# Patient Record
Sex: Female | Born: 1940
Health system: Southern US, Community
[De-identification: ages and names within clinical notes are randomized; demographics above are authoritative.]

## PROBLEM LIST (undated history)

## (undated) DIAGNOSIS — E042 Nontoxic multinodular goiter: Secondary | ICD-10-CM

## (undated) DIAGNOSIS — I452 Bifascicular block: Secondary | ICD-10-CM

## (undated) DIAGNOSIS — I779 Disorder of arteries and arterioles, unspecified: Secondary | ICD-10-CM

## (undated) DIAGNOSIS — I639 Cerebral infarction, unspecified: Secondary | ICD-10-CM

## (undated) DIAGNOSIS — I2510 Atherosclerotic heart disease of native coronary artery without angina pectoris: Secondary | ICD-10-CM

## (undated) DIAGNOSIS — I471 Supraventricular tachycardia, unspecified: Secondary | ICD-10-CM

## (undated) DIAGNOSIS — Z789 Other specified health status: Secondary | ICD-10-CM

## (undated) DIAGNOSIS — M51369 Other intervertebral disc degeneration, lumbar region without mention of lumbar back pain or lower extremity pain: Secondary | ICD-10-CM

## (undated) DIAGNOSIS — S060XAA Concussion with loss of consciousness status unknown, initial encounter: Secondary | ICD-10-CM

## (undated) DIAGNOSIS — E785 Hyperlipidemia, unspecified: Secondary | ICD-10-CM

## (undated) DIAGNOSIS — M5030 Other cervical disc degeneration, unspecified cervical region: Secondary | ICD-10-CM

## (undated) DIAGNOSIS — R55 Syncope and collapse: Secondary | ICD-10-CM

## (undated) DIAGNOSIS — I10 Essential (primary) hypertension: Secondary | ICD-10-CM

## (undated) DIAGNOSIS — G8929 Other chronic pain: Secondary | ICD-10-CM

## (undated) DIAGNOSIS — I455 Other specified heart block: Secondary | ICD-10-CM

## (undated) DIAGNOSIS — I739 Peripheral vascular disease, unspecified: Secondary | ICD-10-CM

## (undated) DIAGNOSIS — J189 Pneumonia, unspecified organism: Secondary | ICD-10-CM

## (undated) DIAGNOSIS — M25561 Pain in right knee: Secondary | ICD-10-CM

## (undated) DIAGNOSIS — K219 Gastro-esophageal reflux disease without esophagitis: Secondary | ICD-10-CM

## (undated) DIAGNOSIS — S8290XA Unspecified fracture of unspecified lower leg, initial encounter for closed fracture: Secondary | ICD-10-CM

## (undated) DIAGNOSIS — E119 Type 2 diabetes mellitus without complications: Secondary | ICD-10-CM

## (undated) DIAGNOSIS — N281 Cyst of kidney, acquired: Secondary | ICD-10-CM

## (undated) DIAGNOSIS — S060X9A Concussion with loss of consciousness of unspecified duration, initial encounter: Secondary | ICD-10-CM

## (undated) DIAGNOSIS — D649 Anemia, unspecified: Secondary | ICD-10-CM

## (undated) DIAGNOSIS — M549 Dorsalgia, unspecified: Secondary | ICD-10-CM

## (undated) DIAGNOSIS — M5136 Other intervertebral disc degeneration, lumbar region: Secondary | ICD-10-CM

## (undated) HISTORY — DX: Supraventricular tachycardia, unspecified: I47.10

## (undated) HISTORY — DX: Anemia, unspecified: D64.9

## (undated) HISTORY — DX: Other specified health status: Z78.9

## (undated) HISTORY — DX: Concussion with loss of consciousness status unknown, initial encounter: S06.0XAA

## (undated) HISTORY — DX: Gastro-esophageal reflux disease without esophagitis: K21.9

## (undated) HISTORY — DX: Other cervical disc degeneration, unspecified cervical region: M50.30

## (undated) HISTORY — DX: Hyperlipidemia, unspecified: E78.5

## (undated) HISTORY — DX: Cerebral infarction, unspecified: I63.9

## (undated) HISTORY — DX: Other specified heart block: I45.5

## (undated) HISTORY — PX: BREAST BIOPSY: SHX20

## (undated) HISTORY — DX: Other intervertebral disc degeneration, lumbar region: M51.36

## (undated) HISTORY — DX: Pain in right knee: M25.561

## (undated) HISTORY — DX: Supraventricular tachycardia: I47.1

## (undated) HISTORY — DX: Other intervertebral disc degeneration, lumbar region without mention of lumbar back pain or lower extremity pain: M51.369

## (undated) HISTORY — DX: Concussion with loss of consciousness of unspecified duration, initial encounter: S06.0X9A

## (undated) HISTORY — PX: BACK SURGERY: SHX140

## (undated) HISTORY — DX: Essential (primary) hypertension: I10

## (undated) HISTORY — PX: CORONARY ANGIOPLASTY: SHX604

## (undated) HISTORY — DX: Cyst of kidney, acquired: N28.1

## (undated) HISTORY — DX: Bifascicular block: I45.2

## (undated) HISTORY — DX: Unspecified fracture of unspecified lower leg, initial encounter for closed fracture: S82.90XA

---

## 1966-05-15 HISTORY — PX: VAGINAL HYSTERECTOMY: SUR661

## 1966-05-15 HISTORY — PX: BREAST LUMPECTOMY: SHX2

## 1985-05-15 DIAGNOSIS — D649 Anemia, unspecified: Secondary | ICD-10-CM

## 1985-05-15 HISTORY — DX: Anemia, unspecified: D64.9

## 1992-08-13 HISTORY — PX: LAPAROSCOPIC CHOLECYSTECTOMY: SUR755

## 1998-12-17 ENCOUNTER — Other Ambulatory Visit: Admission: RE | Admit: 1998-12-17 | Discharge: 1998-12-17 | Payer: Self-pay | Admitting: Obstetrics and Gynecology

## 1999-02-14 ENCOUNTER — Encounter (INDEPENDENT_AMBULATORY_CARE_PROVIDER_SITE_OTHER): Payer: Self-pay

## 1999-02-14 ENCOUNTER — Ambulatory Visit (HOSPITAL_COMMUNITY): Admission: RE | Admit: 1999-02-14 | Discharge: 1999-02-14 | Payer: Self-pay | Admitting: Gastroenterology

## 1999-06-14 ENCOUNTER — Encounter: Admission: RE | Admit: 1999-06-14 | Discharge: 1999-06-14 | Payer: Self-pay | Admitting: Family Medicine

## 1999-06-14 ENCOUNTER — Encounter: Payer: Self-pay | Admitting: Family Medicine

## 1999-06-21 ENCOUNTER — Encounter: Payer: Self-pay | Admitting: Family Medicine

## 1999-06-21 ENCOUNTER — Encounter: Admission: RE | Admit: 1999-06-21 | Discharge: 1999-06-21 | Payer: Self-pay | Admitting: Family Medicine

## 1999-06-29 ENCOUNTER — Encounter: Payer: Self-pay | Admitting: Family Medicine

## 1999-06-29 ENCOUNTER — Encounter: Admission: RE | Admit: 1999-06-29 | Discharge: 1999-06-29 | Payer: Self-pay | Admitting: Family Medicine

## 2000-03-15 ENCOUNTER — Ambulatory Visit (HOSPITAL_COMMUNITY): Admission: RE | Admit: 2000-03-15 | Discharge: 2000-03-15 | Payer: Self-pay | Admitting: *Deleted

## 2000-03-15 ENCOUNTER — Encounter: Payer: Self-pay | Admitting: *Deleted

## 2000-05-15 DIAGNOSIS — E119 Type 2 diabetes mellitus without complications: Secondary | ICD-10-CM

## 2000-05-15 HISTORY — DX: Type 2 diabetes mellitus without complications: E11.9

## 2000-07-06 ENCOUNTER — Encounter: Payer: Self-pay | Admitting: Family Medicine

## 2000-07-06 LAB — CONVERTED CEMR LAB: Hgb A1c MFr Bld: 8.3 %

## 2000-07-17 ENCOUNTER — Ambulatory Visit (HOSPITAL_COMMUNITY): Admission: RE | Admit: 2000-07-17 | Discharge: 2000-07-17 | Payer: Self-pay | Admitting: *Deleted

## 2000-07-17 ENCOUNTER — Encounter: Payer: Self-pay | Admitting: *Deleted

## 2000-11-24 ENCOUNTER — Encounter: Payer: Self-pay | Admitting: Family Medicine

## 2000-11-24 LAB — CONVERTED CEMR LAB: Hgb A1c MFr Bld: 9.6 %

## 2001-07-13 DIAGNOSIS — S8290XA Unspecified fracture of unspecified lower leg, initial encounter for closed fracture: Secondary | ICD-10-CM

## 2001-07-13 DIAGNOSIS — R55 Syncope and collapse: Secondary | ICD-10-CM

## 2001-07-13 HISTORY — DX: Unspecified fracture of unspecified lower leg, initial encounter for closed fracture: S82.90XA

## 2001-07-13 HISTORY — DX: Syncope and collapse: R55

## 2001-09-03 ENCOUNTER — Other Ambulatory Visit: Admission: RE | Admit: 2001-09-03 | Discharge: 2001-09-03 | Payer: Self-pay | Admitting: Obstetrics and Gynecology

## 2001-09-12 HISTORY — PX: POSTERIOR LAMINECTOMY / DECOMPRESSION LUMBAR SPINE: SUR740

## 2001-11-19 ENCOUNTER — Ambulatory Visit (HOSPITAL_COMMUNITY): Admission: RE | Admit: 2001-11-19 | Discharge: 2001-11-19 | Payer: Self-pay | Admitting: Family Medicine

## 2001-11-19 ENCOUNTER — Encounter: Payer: Self-pay | Admitting: Family Medicine

## 2002-05-30 ENCOUNTER — Emergency Department (HOSPITAL_COMMUNITY): Admission: EM | Admit: 2002-05-30 | Discharge: 2002-05-30 | Payer: Self-pay | Admitting: Emergency Medicine

## 2002-05-30 ENCOUNTER — Encounter: Payer: Self-pay | Admitting: Emergency Medicine

## 2002-06-27 ENCOUNTER — Encounter: Payer: Self-pay | Admitting: Family Medicine

## 2002-06-27 ENCOUNTER — Ambulatory Visit (HOSPITAL_COMMUNITY): Admission: RE | Admit: 2002-06-27 | Discharge: 2002-06-27 | Payer: Self-pay | Admitting: Family Medicine

## 2002-07-15 ENCOUNTER — Encounter: Payer: Self-pay | Admitting: Neurological Surgery

## 2002-07-15 ENCOUNTER — Ambulatory Visit (HOSPITAL_COMMUNITY): Admission: RE | Admit: 2002-07-15 | Discharge: 2002-07-16 | Payer: Self-pay | Admitting: Neurological Surgery

## 2002-12-14 ENCOUNTER — Encounter: Payer: Self-pay | Admitting: Family Medicine

## 2002-12-14 LAB — CONVERTED CEMR LAB

## 2003-01-13 ENCOUNTER — Other Ambulatory Visit: Admission: RE | Admit: 2003-01-13 | Discharge: 2003-01-13 | Payer: Self-pay | Admitting: Obstetrics and Gynecology

## 2003-04-03 ENCOUNTER — Encounter: Payer: Self-pay | Admitting: Family Medicine

## 2003-04-03 LAB — CONVERTED CEMR LAB: Microalbumin U total vol: 1.6 mg/L

## 2003-11-18 ENCOUNTER — Encounter: Payer: Self-pay | Admitting: Family Medicine

## 2003-11-18 LAB — CONVERTED CEMR LAB: Hgb A1c MFr Bld: 5.2 %

## 2004-04-15 ENCOUNTER — Other Ambulatory Visit: Admission: RE | Admit: 2004-04-15 | Discharge: 2004-04-15 | Payer: Self-pay | Admitting: Obstetrics and Gynecology

## 2004-06-08 ENCOUNTER — Ambulatory Visit: Payer: Self-pay | Admitting: Family Medicine

## 2004-06-10 ENCOUNTER — Ambulatory Visit: Payer: Self-pay | Admitting: Family Medicine

## 2005-03-10 ENCOUNTER — Ambulatory Visit: Payer: Self-pay | Admitting: Internal Medicine

## 2005-06-15 ENCOUNTER — Encounter: Payer: Self-pay | Admitting: Family Medicine

## 2005-08-14 HISTORY — PX: ESOPHAGOGASTRODUODENOSCOPY: SHX1529

## 2005-08-21 ENCOUNTER — Ambulatory Visit: Payer: Self-pay | Admitting: Family Medicine

## 2005-08-24 ENCOUNTER — Encounter: Admission: RE | Admit: 2005-08-24 | Discharge: 2005-08-24 | Payer: Self-pay | Admitting: Family Medicine

## 2005-08-31 ENCOUNTER — Encounter (INDEPENDENT_AMBULATORY_CARE_PROVIDER_SITE_OTHER): Payer: Self-pay | Admitting: Specialist

## 2005-08-31 ENCOUNTER — Ambulatory Visit: Payer: Self-pay | Admitting: Endocrinology

## 2005-08-31 ENCOUNTER — Other Ambulatory Visit: Admission: RE | Admit: 2005-08-31 | Discharge: 2005-08-31 | Payer: Self-pay | Admitting: Endocrinology

## 2005-10-03 ENCOUNTER — Inpatient Hospital Stay (HOSPITAL_COMMUNITY): Admission: RE | Admit: 2005-10-03 | Discharge: 2005-10-05 | Payer: Self-pay | Admitting: Neurological Surgery

## 2005-10-03 HISTORY — PX: ANTERIOR CERVICAL DECOMP/DISCECTOMY FUSION: SHX1161

## 2005-11-24 ENCOUNTER — Encounter: Admission: RE | Admit: 2005-11-24 | Discharge: 2005-11-24 | Payer: Self-pay | Admitting: Neurological Surgery

## 2006-02-08 ENCOUNTER — Ambulatory Visit: Payer: Self-pay | Admitting: Endocrinology

## 2006-02-14 ENCOUNTER — Ambulatory Visit: Payer: Self-pay | Admitting: Family Medicine

## 2006-02-16 ENCOUNTER — Ambulatory Visit (HOSPITAL_COMMUNITY): Admission: RE | Admit: 2006-02-16 | Discharge: 2006-02-16 | Payer: Self-pay | Admitting: Endocrinology

## 2006-03-09 ENCOUNTER — Ambulatory Visit: Payer: Self-pay | Admitting: Family Medicine

## 2006-07-10 ENCOUNTER — Emergency Department (HOSPITAL_COMMUNITY): Admission: EM | Admit: 2006-07-10 | Discharge: 2006-07-10 | Payer: Self-pay | Admitting: Emergency Medicine

## 2006-07-10 ENCOUNTER — Encounter: Admission: RE | Admit: 2006-07-10 | Discharge: 2006-07-10 | Payer: Self-pay | Admitting: Gastroenterology

## 2006-07-10 ENCOUNTER — Ambulatory Visit: Payer: Self-pay | Admitting: Cardiology

## 2006-07-10 DIAGNOSIS — N281 Cyst of kidney, acquired: Secondary | ICD-10-CM

## 2006-07-10 HISTORY — DX: Cyst of kidney, acquired: N28.1

## 2006-07-19 ENCOUNTER — Observation Stay (HOSPITAL_COMMUNITY): Admission: EM | Admit: 2006-07-19 | Discharge: 2006-07-20 | Payer: Self-pay | Admitting: Emergency Medicine

## 2006-07-19 ENCOUNTER — Ambulatory Visit: Payer: Self-pay | Admitting: Internal Medicine

## 2006-07-23 ENCOUNTER — Encounter: Payer: Self-pay | Admitting: Internal Medicine

## 2006-07-23 ENCOUNTER — Ambulatory Visit: Payer: Self-pay

## 2006-07-24 ENCOUNTER — Ambulatory Visit: Payer: Self-pay | Admitting: Cardiology

## 2006-08-21 ENCOUNTER — Encounter: Payer: Self-pay | Admitting: Family Medicine

## 2006-08-21 DIAGNOSIS — E785 Hyperlipidemia, unspecified: Secondary | ICD-10-CM | POA: Insufficient documentation

## 2006-08-21 DIAGNOSIS — E119 Type 2 diabetes mellitus without complications: Secondary | ICD-10-CM

## 2006-08-21 DIAGNOSIS — M5030 Other cervical disc degeneration, unspecified cervical region: Secondary | ICD-10-CM

## 2006-08-21 DIAGNOSIS — F329 Major depressive disorder, single episode, unspecified: Secondary | ICD-10-CM

## 2006-08-21 DIAGNOSIS — I10 Essential (primary) hypertension: Secondary | ICD-10-CM

## 2006-08-21 DIAGNOSIS — M5137 Other intervertebral disc degeneration, lumbosacral region: Secondary | ICD-10-CM

## 2006-08-21 DIAGNOSIS — D649 Anemia, unspecified: Secondary | ICD-10-CM

## 2006-09-06 ENCOUNTER — Ambulatory Visit: Payer: Self-pay | Admitting: Family Medicine

## 2006-09-10 ENCOUNTER — Ambulatory Visit: Payer: Self-pay | Admitting: Family Medicine

## 2006-09-18 ENCOUNTER — Encounter: Payer: Self-pay | Admitting: Family Medicine

## 2006-10-01 ENCOUNTER — Ambulatory Visit: Payer: Self-pay | Admitting: Cardiovascular Disease

## 2006-10-31 ENCOUNTER — Encounter (INDEPENDENT_AMBULATORY_CARE_PROVIDER_SITE_OTHER): Payer: Self-pay | Admitting: *Deleted

## 2007-01-23 ENCOUNTER — Encounter: Payer: Self-pay | Admitting: Family Medicine

## 2007-02-13 ENCOUNTER — Ambulatory Visit: Payer: Self-pay | Admitting: Family Medicine

## 2007-03-08 ENCOUNTER — Ambulatory Visit: Payer: Self-pay | Admitting: Family Medicine

## 2007-03-10 LAB — CONVERTED CEMR LAB
ALT: 24 units/L (ref 0–35)
AST: 20 units/L (ref 0–37)
Calcium: 9.3 mg/dL (ref 8.4–10.5)
Chloride: 102 meq/L (ref 96–112)
Cholesterol: 138 mg/dL (ref 0–200)
Glucose, Bld: 316 mg/dL — ABNORMAL HIGH (ref 70–99)
HDL: 34.2 mg/dL — ABNORMAL LOW (ref >39.0)
LDL Cholesterol: 83 mg/dL (ref 0–99)
Potassium: 4.3 meq/L (ref 3.5–5.1)
Sodium: 138 meq/L (ref 135–145)
TSH: 2.55 microintl units/mL (ref 0.35–5.50)
Total Bilirubin: 0.8 mg/dL (ref 0.3–1.2)
Total CHOL/HDL Ratio: 4
Total Protein: 6.7 g/dL (ref 6.0–8.3)
Triglycerides: 105 mg/dL (ref 0–149)

## 2007-03-12 ENCOUNTER — Ambulatory Visit: Payer: Self-pay | Admitting: Family Medicine

## 2007-03-25 ENCOUNTER — Ambulatory Visit: Payer: Self-pay | Admitting: Cardiovascular Disease

## 2007-03-27 ENCOUNTER — Telehealth (INDEPENDENT_AMBULATORY_CARE_PROVIDER_SITE_OTHER): Payer: Self-pay | Admitting: *Deleted

## 2007-04-01 ENCOUNTER — Ambulatory Visit: Payer: Self-pay | Admitting: Family Medicine

## 2007-05-07 ENCOUNTER — Ambulatory Visit: Payer: Self-pay | Admitting: Family Medicine

## 2007-05-07 LAB — CONVERTED CEMR LAB: Hgb A1c MFr Bld: 8.3 % — ABNORMAL HIGH (ref 4.6–6.0)

## 2007-05-13 ENCOUNTER — Ambulatory Visit: Payer: Self-pay | Admitting: Family Medicine

## 2007-05-29 ENCOUNTER — Encounter: Payer: Self-pay | Admitting: Family Medicine

## 2007-06-12 ENCOUNTER — Encounter: Admission: RE | Admit: 2007-06-12 | Discharge: 2007-06-12 | Payer: Self-pay | Admitting: Gastroenterology

## 2007-06-12 ENCOUNTER — Encounter: Payer: Self-pay | Admitting: Family Medicine

## 2007-06-27 ENCOUNTER — Telehealth: Payer: Self-pay | Admitting: Family Medicine

## 2007-07-31 ENCOUNTER — Ambulatory Visit: Payer: Self-pay | Admitting: Family Medicine

## 2007-08-07 ENCOUNTER — Ambulatory Visit: Payer: Self-pay | Admitting: Family Medicine

## 2007-08-07 DIAGNOSIS — K210 Gastro-esophageal reflux disease with esophagitis: Secondary | ICD-10-CM

## 2007-08-07 DIAGNOSIS — E042 Nontoxic multinodular goiter: Secondary | ICD-10-CM

## 2007-08-21 ENCOUNTER — Encounter: Admission: RE | Admit: 2007-08-21 | Discharge: 2007-08-21 | Payer: Self-pay | Admitting: Family Medicine

## 2007-09-10 ENCOUNTER — Ambulatory Visit: Payer: Self-pay | Admitting: Family Medicine

## 2007-09-10 LAB — CONVERTED CEMR LAB: Rapid Strep: NEGATIVE

## 2007-12-18 ENCOUNTER — Telehealth (INDEPENDENT_AMBULATORY_CARE_PROVIDER_SITE_OTHER): Payer: Self-pay | Admitting: Internal Medicine

## 2008-01-06 ENCOUNTER — Ambulatory Visit: Payer: Self-pay | Admitting: Family Medicine

## 2008-01-06 LAB — CONVERTED CEMR LAB
AST: 22 units/L (ref 0–37)
Albumin: 4 g/dL (ref 3.5–5.2)
Basophils Relative: 0.2 % (ref 0.0–3.0)
CO2: 29 meq/L (ref 19–32)
Chloride: 110 meq/L (ref 96–112)
Creatinine, Ser: 1.1 mg/dL (ref 0.4–1.2)
Free T4: 0.9 ng/dL (ref 0.6–1.6)
GFR calc Af Amer: 64 mL/min
GFR calc non Af Amer: 52 mL/min
Glucose, Bld: 189 mg/dL — ABNORMAL HIGH (ref 70–99)
HCT: 40.6 % (ref 36.0–46.0)
Lymphocytes Relative: 27.1 % (ref 12.0–46.0)
MCHC: 34.7 g/dL (ref 30.0–36.0)
Monocytes Absolute: 0.5 10*3/uL (ref 0.1–1.0)
Potassium: 4.7 meq/L (ref 3.5–5.1)
T3, Free: 3.1 pg/mL (ref 2.3–4.2)
TSH: 2.38 microintl units/mL (ref 0.35–5.50)
Total Bilirubin: 0.8 mg/dL (ref 0.3–1.2)
Total CHOL/HDL Ratio: 4.9
Total Protein: 6.9 g/dL (ref 6.0–8.3)
WBC: 8 10*3/uL (ref 4.5–10.5)

## 2008-01-08 ENCOUNTER — Ambulatory Visit: Payer: Self-pay | Admitting: Cardiovascular Disease

## 2008-01-15 ENCOUNTER — Telehealth: Payer: Self-pay | Admitting: Family Medicine

## 2008-01-15 ENCOUNTER — Ambulatory Visit: Payer: Self-pay | Admitting: Family Medicine

## 2008-01-16 ENCOUNTER — Encounter: Payer: Self-pay | Admitting: Family Medicine

## 2008-02-03 ENCOUNTER — Encounter: Payer: Self-pay | Admitting: Family Medicine

## 2008-02-07 ENCOUNTER — Encounter: Payer: Self-pay | Admitting: Family Medicine

## 2008-04-01 ENCOUNTER — Ambulatory Visit: Payer: Self-pay | Admitting: Cardiovascular Disease

## 2008-04-22 ENCOUNTER — Ambulatory Visit: Payer: Self-pay | Admitting: Family Medicine

## 2008-04-29 ENCOUNTER — Ambulatory Visit: Payer: Self-pay | Admitting: Family Medicine

## 2008-08-21 ENCOUNTER — Encounter: Payer: Self-pay | Admitting: Family Medicine

## 2008-11-12 DIAGNOSIS — N281 Cyst of kidney, acquired: Secondary | ICD-10-CM

## 2008-11-12 DIAGNOSIS — M549 Dorsalgia, unspecified: Secondary | ICD-10-CM

## 2008-11-12 DIAGNOSIS — I471 Supraventricular tachycardia: Secondary | ICD-10-CM

## 2008-11-12 DIAGNOSIS — K219 Gastro-esophageal reflux disease without esophagitis: Secondary | ICD-10-CM | POA: Insufficient documentation

## 2008-11-12 DIAGNOSIS — R002 Palpitations: Secondary | ICD-10-CM

## 2008-11-13 ENCOUNTER — Ambulatory Visit: Payer: Self-pay | Admitting: Cardiovascular Disease

## 2008-11-13 DIAGNOSIS — R079 Chest pain, unspecified: Secondary | ICD-10-CM

## 2008-12-07 LAB — CONVERTED CEMR LAB: Pap Smear: NORMAL

## 2009-01-01 ENCOUNTER — Telehealth: Payer: Self-pay | Admitting: Cardiovascular Disease

## 2009-01-04 ENCOUNTER — Encounter (INDEPENDENT_AMBULATORY_CARE_PROVIDER_SITE_OTHER): Payer: Self-pay | Admitting: *Deleted

## 2009-02-16 ENCOUNTER — Ambulatory Visit: Payer: Self-pay | Admitting: Family Medicine

## 2009-02-16 LAB — CONVERTED CEMR LAB
ALT: 19 units/L (ref 0–35)
AST: 17 units/L (ref 0–37)
Basophils Absolute: 0.1 10*3/uL (ref 0.0–0.1)
Bilirubin, Direct: 0 mg/dL (ref 0.0–0.3)
CO2: 28 meq/L (ref 19–32)
Chloride: 108 meq/L (ref 96–112)
Cholesterol: 130 mg/dL (ref 0–200)
Creatinine,U: 173 mg/dL
Eosinophils Relative: 4.1 % (ref 0.0–5.0)
Free T4: 0.8 ng/dL (ref 0.6–1.6)
HDL: 35 mg/dL — ABNORMAL LOW (ref >39.00)
Hgb A1c MFr Bld: 7 % — ABNORMAL HIGH (ref 4.6–6.5)
MCV: 89.7 fL (ref 78.0–100.0)
Microalb Creat Ratio: 2.9 mg/g (ref 0.0–30.0)
Microalb, Ur: 0.5 mg/dL (ref 0.0–1.9)
Neutro Abs: 4.5 10*3/uL (ref 1.4–7.7)
Neutrophils Relative %: 60.9 % (ref 43.0–77.0)
Platelets: 362 10*3/uL (ref 150.0–400.0)
Potassium: 4.5 meq/L (ref 3.5–5.1)
RBC: 4.43 M/uL (ref 3.87–5.11)
RDW: 12.7 % (ref 11.5–14.6)
Sodium: 141 meq/L (ref 135–145)

## 2009-02-17 ENCOUNTER — Ambulatory Visit: Payer: Self-pay | Admitting: Family Medicine

## 2009-02-22 ENCOUNTER — Ambulatory Visit: Payer: Self-pay | Admitting: Family Medicine

## 2009-02-22 DIAGNOSIS — M62838 Other muscle spasm: Secondary | ICD-10-CM | POA: Insufficient documentation

## 2009-02-22 DIAGNOSIS — R55 Syncope and collapse: Secondary | ICD-10-CM

## 2009-03-15 ENCOUNTER — Telehealth: Payer: Self-pay | Admitting: Family Medicine

## 2009-03-26 ENCOUNTER — Encounter (INDEPENDENT_AMBULATORY_CARE_PROVIDER_SITE_OTHER): Payer: Self-pay | Admitting: *Deleted

## 2009-05-17 ENCOUNTER — Encounter: Payer: Self-pay | Admitting: Family Medicine

## 2009-06-09 ENCOUNTER — Ambulatory Visit: Payer: Self-pay | Admitting: Cardiovascular Disease

## 2009-06-11 ENCOUNTER — Ambulatory Visit: Payer: Self-pay

## 2009-07-16 ENCOUNTER — Telehealth (INDEPENDENT_AMBULATORY_CARE_PROVIDER_SITE_OTHER): Payer: Self-pay | Admitting: *Deleted

## 2009-09-07 ENCOUNTER — Ambulatory Visit: Payer: Self-pay | Admitting: Cardiovascular Disease

## 2009-10-01 ENCOUNTER — Ambulatory Visit: Payer: Self-pay | Admitting: Internal Medicine

## 2009-10-01 DIAGNOSIS — M25569 Pain in unspecified knee: Secondary | ICD-10-CM | POA: Insufficient documentation

## 2009-10-04 ENCOUNTER — Ambulatory Visit: Payer: Self-pay | Admitting: Family Medicine

## 2009-10-07 ENCOUNTER — Ambulatory Visit: Payer: Self-pay | Admitting: Family Medicine

## 2009-10-08 ENCOUNTER — Encounter: Payer: Self-pay | Admitting: Family Medicine

## 2009-12-16 ENCOUNTER — Encounter (INDEPENDENT_AMBULATORY_CARE_PROVIDER_SITE_OTHER): Payer: Self-pay | Admitting: *Deleted

## 2009-12-16 ENCOUNTER — Ambulatory Visit: Payer: Self-pay | Admitting: Family Medicine

## 2010-02-12 DIAGNOSIS — M25561 Pain in right knee: Secondary | ICD-10-CM

## 2010-02-12 HISTORY — DX: Pain in right knee: M25.561

## 2010-02-18 ENCOUNTER — Ambulatory Visit: Payer: Self-pay | Admitting: Family Medicine

## 2010-02-20 LAB — CONVERTED CEMR LAB
ALT: 23 units/L (ref 0–35)
AST: 17 units/L (ref 0–37)
Albumin: 4 g/dL (ref 3.5–5.2)
Alkaline Phosphatase: 101 units/L (ref 39–117)
BUN: 13 mg/dL (ref 6–23)
Creatinine, Ser: 0.9 mg/dL (ref 0.4–1.2)
Direct LDL: 168.1 mg/dL
GFR calc non Af Amer: 64.86 mL/min (ref >60)
Glucose, Bld: 253 mg/dL — ABNORMAL HIGH (ref 70–99)
HDL: 36.2 mg/dL — ABNORMAL LOW (ref >39.00)
Sodium: 139 meq/L (ref 135–145)
TSH: 3.11 microintl units/mL (ref 0.35–5.50)
Total Bilirubin: 0.5 mg/dL (ref 0.3–1.2)
Total Protein: 6.7 g/dL (ref 6.0–8.3)
VLDL: 27.6 mg/dL (ref 0.0–40.0)

## 2010-02-23 ENCOUNTER — Encounter: Payer: Self-pay | Admitting: Family Medicine

## 2010-02-25 ENCOUNTER — Ambulatory Visit: Payer: Self-pay | Admitting: Family Medicine

## 2010-03-23 ENCOUNTER — Ambulatory Visit: Payer: Self-pay | Admitting: Family Medicine

## 2010-03-23 DIAGNOSIS — R05 Cough: Secondary | ICD-10-CM

## 2010-03-23 DIAGNOSIS — R059 Cough, unspecified: Secondary | ICD-10-CM | POA: Insufficient documentation

## 2010-04-05 ENCOUNTER — Ambulatory Visit: Payer: Self-pay | Admitting: Cardiovascular Disease

## 2010-05-17 ENCOUNTER — Ambulatory Visit
Admission: RE | Admit: 2010-05-17 | Discharge: 2010-05-17 | Payer: Self-pay | Source: Home / Self Care | Attending: Family Medicine | Admitting: Family Medicine

## 2010-05-17 DIAGNOSIS — F4320 Adjustment disorder, unspecified: Secondary | ICD-10-CM | POA: Insufficient documentation

## 2010-06-06 ENCOUNTER — Ambulatory Visit
Admission: RE | Admit: 2010-06-06 | Discharge: 2010-06-06 | Payer: Self-pay | Source: Home / Self Care | Attending: Family Medicine | Admitting: Family Medicine

## 2010-06-06 ENCOUNTER — Other Ambulatory Visit: Payer: Self-pay | Admitting: Family Medicine

## 2010-06-13 ENCOUNTER — Ambulatory Visit
Admission: RE | Admit: 2010-06-13 | Discharge: 2010-06-13 | Payer: Self-pay | Source: Home / Self Care | Attending: Family Medicine | Admitting: Family Medicine

## 2010-06-13 LAB — HM DIABETES FOOT EXAM

## 2010-06-15 ENCOUNTER — Other Ambulatory Visit: Payer: Self-pay | Admitting: Family Medicine

## 2010-06-15 DIAGNOSIS — E042 Nontoxic multinodular goiter: Secondary | ICD-10-CM

## 2010-06-16 ENCOUNTER — Encounter (INDEPENDENT_AMBULATORY_CARE_PROVIDER_SITE_OTHER): Payer: Self-pay | Admitting: *Deleted

## 2010-06-16 ENCOUNTER — Ambulatory Visit: Admit: 2010-06-16 | Payer: Self-pay | Admitting: Cardiovascular Disease

## 2010-06-16 ENCOUNTER — Ambulatory Visit (INDEPENDENT_AMBULATORY_CARE_PROVIDER_SITE_OTHER): Payer: Medicare Other | Admitting: Cardiovascular Disease

## 2010-06-16 ENCOUNTER — Encounter: Payer: Self-pay | Admitting: Cardiovascular Disease

## 2010-06-16 DIAGNOSIS — E785 Hyperlipidemia, unspecified: Secondary | ICD-10-CM

## 2010-06-16 DIAGNOSIS — I10 Essential (primary) hypertension: Secondary | ICD-10-CM

## 2010-06-16 DIAGNOSIS — R002 Palpitations: Secondary | ICD-10-CM

## 2010-06-16 NOTE — Letter (Signed)
Summary: [facility]  [facility]   Imported By: Lanelle Bal 03/07/2010 08:27:03  _____________________________________________________________________  External Attachment:    Type:   Image     Comment:   External Document  Appended Document: [facility]     Clinical Lists Changes  Observations: Added new observation of PAST MED HX: HYPERTENSION (ICD-401.9)---1987 HYPERLIPIDEMIA (ICD-272.4)------1990 SYNCOPE (ICD-780.2) BACK PAIN (ICD-724.5) CAD (ICD-414.00) PSVT (ICD-427.0) GERD (ICD-530.81) HYPERCHOLESTEROLEMIA (ICD-272.0) REFLUX ESOPHAGITIS (ICD-530.11) GOITER, MULTINODULAR (ICD-241.1) DEGENERATIVE DISC DISEASE, CERVICAL SPINE (ICD-722.4) DEGENERATIVE DISC DISEASE, LUMBAR SPINE (ICD-722.52) DIABETES MELLITUS, TYPE II (ICD-250.00)----2002 DEPRESSION (ICD-311)--------------1988 ANXIETY (ICD-300.00)--------------- 1988 ANEMIA-NOS (ICD-285.9) 05/1985 RENAL CYST (ICD-593.2) Dr. Shelle Iron- R knee pain- injected 02/2010 Dr. [name]- cards (03/07/2010 13:47)       Past History:  Past Medical History: HYPERTENSION (ICD-401.9)---1987 HYPERLIPIDEMIA (ICD-272.4)------1990 SYNCOPE (ICD-780.2) BACK PAIN (ICD-724.5) CAD (ICD-414.00) PSVT (ICD-427.0) GERD (ICD-530.81) HYPERCHOLESTEROLEMIA (ICD-272.0) REFLUX ESOPHAGITIS (ICD-530.11) GOITER, MULTINODULAR (ICD-241.1) DEGENERATIVE DISC DISEASE, CERVICAL SPINE (ICD-722.4) DEGENERATIVE DISC DISEASE, LUMBAR SPINE (ICD-722.52) DIABETES MELLITUS, TYPE II (ICD-250.00)----2002 DEPRESSION (ICD-311)--------------1988 ANXIETY (ICD-300.00)--------------- 1988 ANEMIA-NOS (ICD-285.9) 05/1985 RENAL CYST (ICD-593.2) Dr. Shelle Iron- R knee pain- injected 02/2010 Dr. [name]- cards

## 2010-06-16 NOTE — Assessment & Plan Note (Signed)
Summary: F/U ON LABS  CYD   Vital Signs:  Patient profile:   70 year old female Weight:      206.50 pounds Temp:     98.5 degrees F oral Pulse rate:   76 / minute Pulse rhythm:   regular BP sitting:   138 / 100  (left arm) Cuff size:   large  Vitals Entered By: Sydell Axon LPN (Oct 07, 2009 9:12 AM) CC: Follow-up on labs, saw Dr. Alphonsus Sias last Friday for right knee pain, has an appointment scheduled to see Dr. Shelle Iron tomorrow, not sleeping   History of Present Illness: "I have been a bad girl with sweet tea."  She has done this before and got her sugar under control by merely avoiding ingestion of the [place] fluid!  She feels well except she has done something to her right knee and has signifivcant pain in the medial side of the knee that now goes down the lower leg and into the ankle. She remembers no activity triggering this...it started mildly aching last week and got progressively worse as the week went on. She was seen by Dr Benjaman Pott referred her to Ortho. She is to see Dr Shelle Iron tomm. She has been taking percogesic, cannot take Tyl.   Problems Prior to Update: 1)  Knee Pain, Right  (ICD-719.46) 2)  Muscle Spasm  (ICD-728.85) 3)  Diabetes Mellitus, Type II  (ICD-250.00) 4)  Chest Pain Unspecified  (ICD-786.50) 5)  Hypertension  (ICD-401.9) 6)  Hyperlipidemia  (ICD-272.4) 7)  Syncope  (ICD-780.2) 8)  Back Pain  (ICD-724.5) 9)  Cad  (ICD-414.00) 10)  Psvt  (ICD-427.0) 11)  Palpitations  (ICD-785.1) 12)  Gerd  (ICD-530.81) 13)  Reflux Esophagitis  (ICD-530.11) 14)  Goiter, Multinodular  (ICD-241.1) 15)  Sprain/strain, Back Nos/ Rhomboid  (ICD-847.9) 16)  Family History Depression  (ICD-V17.0) 17)  Degenerative Disc Disease, Cervical Spine  (ICD-722.4) 18)  Degenerative Disc Disease, Lumbar Spine  (ICD-722.52) 19)  Diabetes Mellitus, Type II  (ICD-250.00) 20)  Depression  (ICD-311) 21)  Anxiety  (ICD-300.00) 22)  Anemia-nos  (ICD-285.9) 23)  Renal Cyst   (ICD-593.2)  Medications Prior to Update: 1)  Vytorin 10-40 Mg Tabs (Ezetimibe-Simvastatin) .[name].. 1 Daily By Mouth At Bedtime 2)  Januvia 100 Mg Tabs (Sitagliptin Phosphate) .... Take 1 Tablet By Mouth Once A Day 3)  Glyburide 5 Mg Tabs (Glyburide) .[name].. 1 in Am , 1/2 At Night 4)  Carvedilol 6.25 Mg Tabs (Carvedilol) .... Take One Tablet By Mouth Two Times A Day (On Occasion 1 Time A Day) 5)  Omeprazole 20 Mg Tbec (Omeprazole) .... Take One By Mouth As Needed 6)  Cozaar 50 Mg Tabs (Losartan Potassium) .[name].. 1 Once Daily 7)  Percogesic 12.5-325 Mg Tabs (Diphenhydramine-Apap) .... As Needed 8)  Celebrex 200 Mg Caps (Celecoxib) .... One Tab By Mouth Once A Day As Needed, Use Sparingly.  Allergies: No Known Drug Allergies  Physical Exam  General:  alert.  NAD except pain in right knee. Head:  Normocephalic and atraumatic without obvious abnormalities. No apparent alopecia or balding. Eyes:  Conjunctiva clear bilaterally.  Ears:  External ear exam shows no significant lesions or deformities.  Otoscopic examination reveals clear canals, tympanic membranes are intact bilaterally without bulging, retraction, inflammation or discharge. Hearing is grossly normal bilaterally. Nose:  External nasal examination shows no deformity or inflammation. Nasal mucosa are pink and moist without lesions or exudates. Mouth:  Oral mucosa and oropharynx without lesions or exudates.  Teeth in good  repair. Msk:  right knee without sig swelling pain with full flexion and MacMurray's positive with lateral stress No ligament instability no apparent Baker's cyst tender over joint line in lateral to patella and medial to medial collat lig. No effusion appreciated. Nomechymosis seen. Valgus/varus stress not provacative of pain.  Diabetes Management Exam:    Foot Exam (with socks and/or shoes not present):       Sensory-Pinprick/Light touch:          Left medial foot (L-4): normal          Left dorsal foot (L-5):  normal          Left lateral foot (S-1): normal          Right medial foot (L-4): normal          Right dorsal foot (L-5): normal          Right lateral foot (S-1): normal       Sensory-Monofilament:          Left foot: normal          Right foot: normal       Inspection:          Left foot: normal          Right foot: normal       Nails:          Left foot: normal          Right foot: normal   Impression & Recommendations:  Problem # 1:  DIABETES MELLITUS, TYPE II (ICD-250.00) Assessment Deteriorated  A1C elevating. Says she can get better control of her diet. Tried to encourage not to let the diet control disintegrate. Discussed complication s of diabetes again. Gave plot of A1Cs. Her updated medication list for this problem includes:    Januvia 100 Mg Tabs (Sitagliptin phosphate) .[name]... Take 1 tablet by mouth once a day    Glyburide 5 Mg Tabs (Glyburide) .[name]... 1 in am , 1/2 at night    Cozaar 50 Mg Tabs (Losartan potassium) .[name]... 1 once daily  Labs Reviewed: Creat: 1.0 (02/16/2009)   Microalbumin: 6.1 (02/14/2006)  Last Eye Exam: normal (05/17/2009) Reviewed HgBA1c results: 7.7 (10/04/2009)  7.0 (02/16/2009)  Problem # 2:  HYPERTENSION (ICD-401.9) Assessment: Deteriorated High today but has been ok until now and is in significant discomfort today. Will follow. Needs appt in Oct/Nov. Her updated medication list for this problem includes:    Carvedilol 6.25 Mg Tabs (Carvedilol) .[name]... Take one tablet by mouth two times a day (on occasion 1 time a day)    Cozaar 50 Mg Tabs (Losartan potassium) .[name]... 1 once daily  BP today: 138/100 Prior BP: 128/80 (10/01/2009)  Labs Reviewed: K+: 4.5 (02/16/2009) Creat: : 1.0 (02/16/2009)   Chol: 130 (02/16/2009)   HDL: 35.00 (02/16/2009)   LDL: 79 (02/16/2009)   TG: 78.0 (02/16/2009)  Problem # 3:  KNEE PAIN, RIGHT (ICD-719.46) Assessment: Unchanged  Heat and ice as discussed. Keep appt tomm with Dr Shelle Iron. Cont pain medication she  has. Her updated medication list for this problem includes:    Celebrex 200 Mg Caps (Celecoxib) ..... One tab by mouth once a day as needed, use sparingly.  Complete Medication List: 1)  Vytorin 10-40 Mg Tabs (Ezetimibe-simvastatin) .[name].. 1 daily by mouth at bedtime 2)  Januvia 100 Mg Tabs (Sitagliptin phosphate) .... Take 1 tablet by mouth once a day 3)  Glyburide 5 Mg Tabs (Glyburide) .[name].. 1 in am , 1/2 at night 4)  Carvedilol 6.25 Mg Tabs (Carvedilol) .... Take one tablet by mouth two times a day (on occasion 1 time a day) 5)  Omeprazole 20 Mg Tbec (Omeprazole) .... Take one by mouth as needed 6)  Cozaar 50 Mg Tabs (Losartan potassium) .[name].. 1 once daily 7)  Percogesic 12.5-325 Mg Tabs (Diphenhydramine-apap) .... As needed 8)  Celebrex 200 Mg Caps (Celecoxib) .... One tab by mouth once a day as needed, use sparingly.  Patient Instructions: 1)  RTC as needed, make appt for Oct/Nov.  Current Allergies (reviewed today): No known allergies

## 2010-06-16 NOTE — Progress Notes (Signed)
   Phone Note Outgoing Call   Call placed by: Deliah Goody, RN,  July 16, 2009 2:24 PM Summary of Call: monitor reviewed by dr [name], NSR occ PVC's, no sig arrythmia Left message to call back Deliah Goody, RN  July 16, 2009 2:24 PM   Follow-up for Phone Call        pt aware of results Deliah Goody, RN  July 16, 2009 4:30 PM

## 2010-06-16 NOTE — Consult Note (Signed)
Summary: [facility]  [facility]   Imported By: Beau Fanny 05/26/2009 16:25:00  _____________________________________________________________________  External Attachment:    Type:   Image     Comment:   External Document  Appended Document: [facility]     Clinical Lists Changes  Observations: Added new observation of DMEYEEXAMNXT: 05/2010 (06/01/2009 18:42) Added new observation of DMEYEEXMRES: normal (05/17/2009 18:42) Added new observation of EYE EXAM BY: Dr Perrin Smack (05/17/2009 18:42) Added new observation of DIAB EYE EX: normal (05/17/2009 18:42)        Diabetes Management Exam:    Eye Exam:       Eye Exam done elsewhere          Date: 05/17/2009          Results: normal          Done by: Dr Perrin Smack
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
## 2010-06-16 NOTE — Assessment & Plan Note (Signed)
Summary: CONGESTION,COUGH/CLE   Vital Signs:  Patient profile:   70 year old female Height:      66 inches Weight:      203.75 pounds BMI:     33.00 Temp:     98.6 degrees F oral Pulse rate:   80 / minute Pulse rhythm:   regular BP sitting:   142 / 84  (left arm) Cuff size:   large  Vitals Entered By: Delilah Shan CMA Duncan Dull) (March 23, 2010 3:35 PM) CC: Congestion, cough   History of Present Illness: Husband died on 26-Oct-2022.  Prolonged illness.   3 weeks with ST, cough, drainage, rhinorrhea.  Coughing increased at night. Green sputum.  No fevers.  Glucose fairly well controlled given the circumstances.   "running on fumes" after caring for husband.  Family supporitive.   Allergies: 1)  ! Naprosyn 2)  ! Ibuprofen 3)  ! Vytorin 4)  ! Metformin Hcl  Social History: Reviewed history from 12/16/2009 and no changes required. 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 [employer]  no tob   Review of Systems       See HPI.  Otherwise negative.    Physical Exam  General:  GEN: nad, alert and oriented HEENT: mucous membranes moist, TM w/o erythema, nasal epithelium injected, OP with cobblestoning NECK: supple w/o LA CV: rrr. PULM: ctab, no inc wob ABD: soft, +bs EXT: no edema    Impression & Recommendations:  Problem # 1:  COUGH (ICD-786.2) I would start antibiotics given the duration of symptoms.  Frontal sinuses tender to palpation and she likely has a sinusitis with post nasal gtt causing the cough.  Sedation caution re: cough meds.  Fu as needed.  Condolences offered WJ:[id number].   Complete Medication List: 1)  Januvia 100 Mg Tabs (Sitagliptin phosphate) .... Take 1 tablet by mouth once a day 2)  Glyburide 5 Mg Tabs (Glyburide) .[name].. 1 in am , 1/2 at night 3)  Carvedilol 6.25 Mg Tabs (Carvedilol) .... Take one tablet by mouth two times a day  (on occasion 1 time a day) 4)  Omeprazole 20 Mg Tbec (Omeprazole) .... Take one by mouth as needed 5)  Cozaar 50 Mg Tabs (Losartan potassium) .[name].. 1 once daily 6)  Percogesic 12.5-325 Mg Tabs (Diphenhydramine-apap) .... As needed 7)  Celebrex 200 Mg Caps (Celecoxib) .... One tab by mouth once a day as needed, use sparingly. 8)  Zithromax 250 Mg Tabs (Azithromycin) .... 2 by mouth today and then 1 by mouth once daily for 4 days. 9)  Hydrocodone-homatropine 5-1.5 Mg/63ml Syrp (Hydrocodone-homatropine) .... 5 ml by mouth q6-8 hours as needed cough, sedation caution  Patient Instructions: 1)  I would use the cough medicine (it may make you drowsy) and start the antibiotics today.  Try to get some rest and let me know if there is anything that I can do.  2)  I want you to get your A1c checked in about 2 months (250.00) with a 30 min office visit a few days after that to discuss your sugar and cholesterol meds.   3)  Take care.   Prescriptions: HYDROCODONE-HOMATROPINE 5-1.5 MG/5ML SYRP (HYDROCODONE-HOMATROPINE) 5 ml by mouth q6-8 hours as needed cough, sedation caution  #4oz x 0   Entered and Authorized by:   Crawford Givens MD   Signed by:   Crawford Givens MD on 03/23/2010  Method used:   Print then Give to Patient   RxID:   [id number] ZITHROMAX 250 MG TABS (AZITHROMYCIN) 2 by mouth today and then 1 by mouth once daily for 4 days.  #6 x 0   Entered and Authorized by:   Crawford Givens MD   Signed by:   Crawford Givens MD on 03/23/2010   Method used:   Print then Give to Patient   RxID:   [id number]    Orders Added: 1)  Est. Patient Level III [95284]    Current Allergies (reviewed today): ! NAPROSYN ! IBUPROFEN ! VYTORIN ! METFORMIN HCL
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
## 2010-06-16 NOTE — Assessment & Plan Note (Signed)
Summary: xfer from schaller/alc   Vital Signs:  Patient profile:   70 year old female Height:      66 inches Weight:      206.25 pounds BMI:     33.41 Temp:     98.4 degrees F oral Pulse rate:   76 / minute Pulse rhythm:   regular BP sitting:   112 / 76  (left arm) Cuff size:   regular  Vitals Entered By: Delilah Shan CMA Duncan Dull) (December 16, 2009 12:05 PM) CC: Transfer from [facility]   History of Present Illness: H/o palpitations and follow up per cards- h/o SVT.   H/o multinodular goiter and due for TSH in 10/11.   Diabetes:  Using medications without difficulties:yes Hypoglycemic episodes: no Hyperglycemic episodes: rare Feet problems: no Blood Sugars averaging:  ~100-150, increased if eating watermelon eye exam within last year: done 1/11  Elevated Cholesterol: Using medications without problems:yes except for h/o myalgias Muscle aches: no change in aches when off med, but this time off med was brief Other complaints: no  Deconditioned but otherwise doing well.    H/o neck pain that responds to celebrex.  tends to flare with activity which is increased in summer.   Allergies (verified): 1)  ! Naprosyn 2)  ! Ibuprofen  Past History:  Past Medical History: HYPERTENSION (ICD-401.9)---1987 HYPERLIPIDEMIA (ICD-272.4)------1990 SYNCOPE (ICD-780.2) BACK PAIN (ICD-724.5) CAD (ICD-414.00) PSVT (ICD-427.0) GERD (ICD-530.81) HYPERCHOLESTEROLEMIA (ICD-272.0) REFLUX ESOPHAGITIS (ICD-530.11) GOITER, MULTINODULAR (ICD-241.1) DEGENERATIVE DISC DISEASE, CERVICAL SPINE (ICD-722.4) DEGENERATIVE DISC DISEASE, LUMBAR SPINE (ICD-722.52) DIABETES MELLITUS, TYPE II (ICD-250.00)----2002 DEPRESSION (ICD-311)--------------1988 ANXIETY (ICD-300.00)--------------- 1988 ANEMIA-NOS (ICD-285.9) 05/1985 RENAL CYST (ICD-593.2) Dr. Shelle Iron- R knee pain Dr. [name]- cards  Family History: Reviewed history from 02/22/2009 and no changes required. Father: DIED MVA@ 4 YOA; BORN WITH HEART  PROBLEM Mother: A  90  HTN, PACER DEMENTIA BROTHER A 60  1/2 JUVENILE DM, FOOT ULCER Periph Neurop. SISTER A 66 DM CV: +MGF DIED MI @53  HBP: +MOTHER ALL GRANDPARENTS/ SELF DM:= SELF 1/2 BROTHER SISTER, [employer] PGF PROSTATE CANCER: LUNG UNCLE Energy manager) DEPRESSION: + P AUNT OTHER+ STROKE [employer] ,DIED Family History Depression  Social History: Reviewed history from 08/21/2006 and no changes required. 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 [employer]  no tob   Review of Systems       See HPI.  Otherwise negative.    Physical Exam  General:  GEN: nad, alert and oriented HEENT: mucous membranes moist NECK: supple w/o LA CV: regular rate and rhythm  PULM: ctab, no inc wob ABD: soft, +bs EXT: no edema SKIN: no acute rash   Diabetes Management Exam:    Foot Exam (with socks and/or shoes not present):       Sensory-Pinprick/Light touch:          Left medial foot (L-4): normal          Left dorsal foot (L-5): normal          Left lateral foot (S-1): normal          Right medial foot (L-4): normal          Right dorsal foot (L-5): normal          Right lateral foot (S-1): normal       Sensory-Monofilament:          Left foot: normal          Right foot: normal  Inspection:          Left foot: normal          Right foot: normal       Nails:          Left foot: normal          Right foot: normal   Impression & Recommendations:  Problem # 1:  DIABETES MELLITUS, TYPE II (ICD-250.00) Return for labs.  D/w patient re: exercise and diet.   Her updated medication list for this problem includes:    Januvia 100 Mg Tabs (Sitagliptin phosphate) .[name]... Take 1 tablet by mouth once a day    Glyburide 5 Mg Tabs (Glyburide) .[name]... 1 in am , 1/2 at night    Cozaar 50 Mg Tabs (Losartan potassium) .[name]... 1 once daily  Problem # 2:  HYPERTENSION (ICD-401.9) No change in meds.  Her  updated medication list for this problem includes:    Carvedilol 6.25 Mg Tabs (Carvedilol) .[name]... Take one tablet by mouth two times a day (on occasion 1 time a day)    Cozaar 50 Mg Tabs (Losartan potassium) .[name]... 1 once daily  Problem # 3:  HYPERLIPIDEMIA (ICD-272.4) Return for labs.  Her updated medication list for this problem includes:    Vytorin 10-40 Mg Tabs (Ezetimibe-simvastatin) .[name]... 1 daily by mouth at bedtime  Problem # 4:  GOITER, MULTINODULAR (ICD-241.1)  Problem # 5:  MUSCLE SPASM (ICD-728.85)  Complete Medication List: 1)  Vytorin 10-40 Mg Tabs (Ezetimibe-simvastatin) .[name].. 1 daily by mouth at bedtime 2)  Januvia 100 Mg Tabs (Sitagliptin phosphate) .... Take 1 tablet by mouth once a day 3)  Glyburide 5 Mg Tabs (Glyburide) .[name].. 1 in am , 1/2 at night 4)  Carvedilol 6.25 Mg Tabs (Carvedilol) .... Take one tablet by mouth two times a day (on occasion 1 time a day) 5)  Omeprazole 20 Mg Tbec (Omeprazole) .... Take one by mouth as needed 6)  Cozaar 50 Mg Tabs (Losartan potassium) .[name].. 1 once daily 7)  Percogesic 12.5-325 Mg Tabs (Diphenhydramine-apap) .... As needed 8)  Celebrex 200 Mg Caps (Celecoxib) .... One tab by mouth once a day as needed, use sparingly.  Patient Instructions: 1)  Return for fasting labs in 10/11 and then an office visit a few days after than.  2)  TSH 241.1 3)  CMET/lipid/MALB/A1c 250.00 4)  Stop the vytorin for a few weeks and see if your muscle aches change. 5)  Check your fasting sugar a few days before you come back in.  6)  Gradually increase your amount of exercise. Prescriptions: CELEBREX 200 MG CAPS (CELECOXIB) one tab by mouth once a day as needed, use sparingly.  #30 x 3   Entered and Authorized by:   Crawford Givens MD   Signed by:   Crawford Givens MD on 12/16/2009   Method used:   Electronically to        [facility]* (retail)       [address] [place], [place]   [address]       Ph: [phone] or [phone]       Fax: [phone]   RxID:   [id number]     Current Allergies (reviewed today): ! NAPROSYN ! IBUPROFEN

## 2010-06-16 NOTE — Letter (Signed)
Summary: Nadara Eaton letter  [facility]  [address] [place], [place] [address]   Phone: [phone]  Fax: [phone]       12/16/2009 MRN: [id number]  [facility] [address] [place], [place]  [address]  Dear Ms. [facility], and [facility] announce the retirement of Arta Silence, M.D., from full-time practice at the [facility] office effective November 11, 2009 and his plans of returning part-time.  It is important to Dr. Hetty Ely and to our practice that you understand that [facility] has seven physicians in our office for your health care needs.  We will continue to offer the same exceptional care that you have today.    Dr. Hetty Ely has spoken to many of you about his plans for retirement and returning part-time in the fall.   We will continue to work with you through the transition to schedule appointments for you in the office and meet the high standards that [facility] is committed to.   Again, it is with great pleasure that we share the news that Dr. Hetty Ely will return to [facility] at [facility] in October of 2011 with a reduced schedule.    If you have any questions, or would like to request an appointment with one of our physicians, please call us at [phone] and press the option for Scheduling an appointment.  We take pleasure in providing you with excellent patient care and look forward to seeing you at your next office visit.  Our [facility] Physicians are:  Tillman Abide, M.D. Laurita Quint, M.D. Roxy Manns, M.D. Kerby Nora, M.D. Hannah Beat, M.D. Ruthe Mannan, M.D. We proudly welcomed Raechel Ache, M.D. and Eustaquio Boyden, M.D. to the practice in July/August 2011.  Sincerely,   [facility]

## 2010-06-16 NOTE — Consult Note (Signed)
Summary: Dr.Jeffrey Beane,[facility],Note  Dr.Jeffrey Beane,[facility],Note   Imported By: Beau Fanny 10/13/2009 14:20:45  _____________________________________________________________________  External Attachment:    Type:   Image     Comment:   External Document

## 2010-06-16 NOTE — Assessment & Plan Note (Signed)
Summary: ROA FOR FOLLOW-UP/JRR   Vital Signs:  Patient profile:   70 year old female Height:      66 inches Weight:      204.50 pounds BMI:     33.13 Temp:     98.6 degrees F oral Pulse rate:   76 / minute Pulse rhythm:   regular BP sitting:   120 / 76  (left arm) Cuff size:   large  Vitals Entered By: Lewanda Rife LPN (February 25, 2010 8:08 AM) CC: follow-up visit   History of Present Illness: HLD:  Off vytorin and feeling better.  Less aching.    Husband lost toe with amputation and C diff.  Still in the [facility].    Diabetes: steroid shot recently for knee pain.  Using medications without difficulties:yes Hypoglycemic episodes: no Hyperglycemic episodes:as below Feet problems:no Blood Sugars averaging: 250 this AM, increased after the injection eye exam within last year: goes in January.   Intolerant of metformin at low doses.   Allergies: 1)  ! Naprosyn 2)  ! Ibuprofen 3)  ! Vytorin 4)  ! Metformin Hcl  Past History:  Past Medical History: Last updated: 12/16/2009 HYPERTENSION (ICD-401.9)---1987 HYPERLIPIDEMIA (ICD-272.4)------1990 SYNCOPE (ICD-780.2) BACK PAIN (ICD-724.5) CAD (ICD-414.00) PSVT (ICD-427.0) GERD (ICD-530.81) HYPERCHOLESTEROLEMIA (ICD-272.0) REFLUX ESOPHAGITIS (ICD-530.11) GOITER, MULTINODULAR (ICD-241.1) DEGENERATIVE DISC DISEASE, CERVICAL SPINE (ICD-722.4) DEGENERATIVE DISC DISEASE, LUMBAR SPINE (ICD-722.52) DIABETES MELLITUS, TYPE II (ICD-250.00)----2002 DEPRESSION (ICD-311)--------------1988 ANXIETY (ICD-300.00)--------------- 1988 ANEMIA-NOS (ICD-285.9) 05/1985 RENAL CYST (ICD-593.2) Dr. Shelle Iron- R knee pain Dr. [name]- cards  Review of Systems       See HPI.  Otherwise negative.    Physical Exam  General:  GEN: nad, alert and oriented HEENT: mucous membranes moist NECK: supple w/o LA CV: regular rate and rhythm  PULM: ctab, no inc wob ABD: soft, +bs EXT: no edema SKIN: no acute rash   Diabetes Management Exam:    Foot  Exam (with socks and/or shoes not present):       Sensory-Pinprick/Light touch:          Left medial foot (L-4): normal          Left dorsal foot (L-5): normal          Left lateral foot (S-1): normal          Right medial foot (L-4): normal          Right dorsal foot (L-5): normal          Right lateral foot (S-1): normal       Sensory-Monofilament:          Left foot: normal          Right foot: normal       Inspection:          Left foot: normal          Right foot: normal       Nails:          Left foot: normal          Right foot: normal   Impression & Recommendations:  Problem # 1:  DIABETES MELLITUS, TYPE II (ICD-250.00) A1c likely influenced by steroids.  Will return for repeat labs and work on diet in meantime.  Her updated medication list for this problem includes:    Januvia 100 Mg Tabs (Sitagliptin phosphate) .[name]... Take 1 tablet by mouth once a day    Glyburide 5 Mg Tabs (Glyburide) .[name]... 1 in am , 1/2 at night    Cozaar 50 Mg  Tabs (Losartan potassium) .[name]... 1 once daily  Problem # 2:  HYPERLIPIDEMIA (ICD-272.4) No new meds for now.  Will d/w patient about nonsimvastatin meds at next OV.  She agrees.  The following medications were removed from the medication list:    Vytorin 10-40 Mg Tabs (Ezetimibe-simvastatin) .[name]... 1 daily by mouth at bedtime  Complete Medication List: 1)  Januvia 100 Mg Tabs (Sitagliptin phosphate) .... Take 1 tablet by mouth once a day 2)  Glyburide 5 Mg Tabs (Glyburide) .[name].. 1 in am , 1/2 at night 3)  Carvedilol 6.25 Mg Tabs (Carvedilol) .... Take one tablet by mouth two times a day (on occasion 1 time a day) 4)  Omeprazole 20 Mg Tbec (Omeprazole) .... Take one by mouth as needed 5)  Cozaar 50 Mg Tabs (Losartan potassium) .[name].. 1 once daily 6)  Percogesic 12.5-325 Mg Tabs (Diphenhydramine-apap) .... As needed 7)  Celebrex 200 Mg Caps (Celecoxib) .... One tab by mouth once a day as needed, use sparingly.  Other Orders: Flu Vaccine 60yrs +  MEDICARE PATIENTS (E4540) Administration Flu vaccine - MCR (J8119)  Patient Instructions: 1)  Work on your diet and think about what to substitute for sweet tea.    I would recheck your A1c in 3 months with a office visit a few days after that.  We can talk about your meds at that point.  We can address your lipids then, too.    Current Allergies (reviewed today): ! NAPROSYN ! IBUPROFEN ! VYTORIN ! METFORMIN HCL     Flu Vaccine Consent Questions     Do you have a history of severe allergic reactions to this vaccine? no    Any prior history of allergic reactions to egg and/or gelatin? no    Do you have a sensitivity to the preservative Thimersol? no    Do you have a past history of Guillan-Barre Syndrome? no    Do you currently have an acute febrile illness? no    Have you ever had a severe reaction to latex? no    Vaccine information given and explained to patient? yes    Are you currently pregnant? no    Lot Number:[id number]   Exp Date:11/12/2010   Site Given  Left Deltoid IMflu1 Lewanda Rife LPN  February 25, 2010 8:16 AM

## 2010-06-20 ENCOUNTER — Ambulatory Visit
Admission: RE | Admit: 2010-06-20 | Discharge: 2010-06-20 | Disposition: A | Discharge status: Home / Self Care | Payer: Medicare Other | Source: Ambulatory Visit | Attending: Family Medicine | Admitting: Family Medicine

## 2010-06-20 DIAGNOSIS — E042 Nontoxic multinodular goiter: Secondary | ICD-10-CM
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
## 2010-06-22 NOTE — Miscellaneous (Signed)
  Clinical Lists Changes  Medications: Added new medication of CRESTOR 5 MG TABS (ROSUVASTATIN CALCIUM) Take one tablet by mouth daily. - Signed Rx of CRESTOR 5 MG TABS (ROSUVASTATIN CALCIUM) Take one tablet by mouth daily.;  #30 x 12;  Signed;  Entered by: Deliah Goody, RN;  Authorized by: Colon Branch, MD, [facility];  Method used: Electronically to [facility]*, [address] [place], [place]  [address], Ph: [phone] or [phone], Fax: [phone] Observations: Added new observation of PI CARDIO: Your physician recommends that you return for lab work in:3 MONTHS IF TOLERATING CRESTOR Your physician has recommended you make the following change in your medication: START CRESTOR 5MG  ONE TABLET EVERYOTHER DAY Your physician wants you to follow-up in:6 MONTHS  You will receive a reminder letter in the mail two months in advance. If you don't receive a letter, please call our office to schedule the follow-up appointment. (06/16/2010 15:05)    Prescriptions: CRESTOR 5 MG TABS (ROSUVASTATIN CALCIUM) Take one tablet by mouth daily.  #30 x 12   Entered by:   Deliah Goody, RN   Authorized by:   Colon Branch, MD, [facility]   Signed by:   Deliah Goody, RN on 06/16/2010   Method used:   Electronically to        [facility]* (retail)       [address] [place], [place]  [address]       Ph: [phone] or [phone]       Fax: [phone]   RxID:   [id number]    Patient Instructions: 1)  Your physician recommends that you return for lab work in:3 MONTHS IF TOLERATING CRESTOR 2)  Your physician has recommended you make the following change in your medication: START CRESTOR 5MG  ONE TABLET EVERYOTHER DAY 3)  Your physician wants you to follow-up in:6 MONTHS  You will receive a reminder letter in the mail two months in advance. If you don't receive a letter, please call  our office to schedule the follow-up appointment.

## 2010-06-24 ENCOUNTER — Other Ambulatory Visit: Payer: Self-pay | Admitting: Family Medicine

## 2010-06-24 DIAGNOSIS — E049 Nontoxic goiter, unspecified: Secondary | ICD-10-CM

## 2010-07-20 ENCOUNTER — Encounter: Payer: Self-pay | Admitting: Family Medicine

## 2010-08-29 ENCOUNTER — Other Ambulatory Visit: Payer: Self-pay | Admitting: *Deleted

## 2010-08-29 DIAGNOSIS — I10 Essential (primary) hypertension: Secondary | ICD-10-CM

## 2010-08-29 DIAGNOSIS — E7800 Pure hypercholesterolemia, unspecified: Secondary | ICD-10-CM

## 2010-08-30 ENCOUNTER — Other Ambulatory Visit: Payer: Self-pay | Admitting: *Deleted

## 2010-08-30 ENCOUNTER — Telehealth: Payer: Self-pay | Admitting: *Deleted

## 2010-08-30 MED ORDER — LOSARTAN POTASSIUM 50 MG PO TABS: 50.0000 mg | ORAL_TABLET | Freq: Every day | ORAL | Status: DC

## 2010-08-30 NOTE — Telephone Encounter (Signed)
Prior auth is needed for omeprazole, form is on your desk. 

## 2010-08-30 NOTE — Telephone Encounter (Signed)
I will address on the hard copy.  

## 2010-09-05 ENCOUNTER — Other Ambulatory Visit (INDEPENDENT_AMBULATORY_CARE_PROVIDER_SITE_OTHER): Payer: Medicare Other | Admitting: Family Medicine

## 2010-09-05 DIAGNOSIS — E7800 Pure hypercholesterolemia, unspecified: Secondary | ICD-10-CM

## 2010-09-05 DIAGNOSIS — I10 Essential (primary) hypertension: Secondary | ICD-10-CM

## 2010-09-05 DIAGNOSIS — E119 Type 2 diabetes mellitus without complications: Secondary | ICD-10-CM

## 2010-09-05 LAB — COMPREHENSIVE METABOLIC PANEL
Albumin: 3.9 g/dL (ref 3.5–5.2)
CO2: 25 mEq/L (ref 19–32)
GFR: 63.94 mL/min (ref >60.00)
Glucose, Bld: 202 mg/dL — ABNORMAL HIGH (ref 70–99)
Potassium: 4.4 mEq/L (ref 3.5–5.1)
Sodium: 141 mEq/L (ref 135–145)
Total Protein: 6.4 g/dL (ref 6.0–8.3)

## 2010-09-05 LAB — LIPID PANEL
Cholesterol: 161 mg/dL (ref 0–200)
LDL Cholesterol: 100 mg/dL — ABNORMAL HIGH (ref 0–99)

## 2010-09-05 LAB — HEMOGLOBIN A1C: Hgb A1c MFr Bld: 7.7 % — ABNORMAL HIGH (ref 4.6–6.5)

## 2010-09-06 NOTE — Progress Notes (Signed)
Will d/w pt at OV

## 2010-09-12 ENCOUNTER — Ambulatory Visit (INDEPENDENT_AMBULATORY_CARE_PROVIDER_SITE_OTHER): Payer: Medicare Other | Admitting: Family Medicine

## 2010-09-12 ENCOUNTER — Ambulatory Visit: Payer: Medicare Other | Admitting: Family Medicine

## 2010-09-12 ENCOUNTER — Encounter: Payer: Self-pay | Admitting: Family Medicine

## 2010-09-12 DIAGNOSIS — M25519 Pain in unspecified shoulder: Secondary | ICD-10-CM

## 2010-09-12 DIAGNOSIS — F4320 Adjustment disorder, unspecified: Secondary | ICD-10-CM

## 2010-09-12 DIAGNOSIS — M25512 Pain in left shoulder: Secondary | ICD-10-CM | POA: Insufficient documentation

## 2010-09-12 DIAGNOSIS — E785 Hyperlipidemia, unspecified: Secondary | ICD-10-CM

## 2010-09-12 DIAGNOSIS — I10 Essential (primary) hypertension: Secondary | ICD-10-CM

## 2010-09-12 DIAGNOSIS — E119 Type 2 diabetes mellitus without complications: Secondary | ICD-10-CM

## 2010-09-12 NOTE — Assessment & Plan Note (Signed)
Improved, no change in meds.  Labs d/w pt.  

## 2010-09-12 NOTE — Assessment & Plan Note (Signed)
Controlled, no change in meds. Labs d/w pt.  

## 2010-09-12 NOTE — Progress Notes (Signed)
She'll is having L shoulder pain and dec in ROM.  She'll fu with Dr. Shelle Iron about this.   Diabetes:  Using medications without difficulties: yes Hypoglycemic episodes:no Hyperglycemic episodes: slightly higher in AM (130-140), but lower later in the day Feet problems:no Blood Sugars averaging:see above.  eye exam within last year: scheduled per patient We talked about cutting back on sweet tea.  She is getting some exercise in the yard.    Hypertension:    Using medication without problems or lightheadedness: yes Chest pain with exertion:no Edema:no Short of breath:no Average home BPs: similar to today   Elevated Cholesterol: Using medications without problems:yes Muscle aches: no  PMH and SH reviewed.   Vital signs, Meds and allergies reviewed.  ROS: See HPI.  Otherwise nontributory.   GEN: nad, alert and oriented L shoulder with dec in ROM, pain on abduction, distally NV intact with normal ROM at the elbow NECK: supple w/o LA CV: rrr PULM: ctab, no inc wob ABD: soft, +bs EXT: no edema SKIN: no acute rash  Diabetic foot exam: Normal inspection No skin breakdown No calluses  Normal DP pulses Normal sensation to light tough and monofilament Nails normal

## 2010-09-12 NOTE — Assessment & Plan Note (Signed)
She'll call about f/u with ortho.  

## 2010-09-12 NOTE — Patient Instructions (Signed)
Don't change your meds.  Call ortho about your shoulder.  Keep exercising and cut back on the sweet tea.  Recheck labs in 3 months with visit after that.  Take care.

## 2010-09-12 NOTE — Assessment & Plan Note (Signed)
We talked about this today.  Her affect is wnl and she is improving gradually.

## 2010-09-12 NOTE — Assessment & Plan Note (Signed)
She'll work on diet and exercise.  No change in meds.  Labs d/w pt.  She understood that A1c was up some today.  Recheck 3 months.

## 2010-09-27 NOTE — Assessment & Plan Note (Signed)
[facility]                            CARDIOLOGY OFFICE NOTE   ELLIETT, GUARISCO                     MRN:          [id number]  DATE:03/25/2007                            DOB:          01-21-1941    Ms. Gates returns today for followup.  I have seen her in the past for  palpitations, PSVT, hypertension, hypercholesterolemia.  She has no  documented coronary artery disease.  She has multiple coronary risk  factors including diabetes.  Her last Myoview in March, 2008 was  nonischemic.   She still has not discussed the issue of ARB or ACE inhibitor with Dr.  Hetty Ely.  I think she should be on one for renal protective effects  given her diabetes.  We called Dr. Lorenza Chick nurse today and he will  review this with Britta Mccreedy and get back to her this week hopefully.  In  regards to her heart she has not had any significant palpitations, there  has been no recurrence of her PSVT. She says the risk factors are well  modified.  Dr. Hetty Ely just checked her cholesterol, her hemoglobin A1C  this week as a part of a physical.   She has been compliant with her medications.   REVIEW OF SYSTEMS:  Remarkable for some increased dreams, some of which  are not pleasant.  She relates this to her Metoprolol and we may need to  change her beta blocker to  a less lipophilic one, otherwise negative.   CURRENT MEDICATIONS:  1. Januvia 100 daily.  2. An aspirin daily.  3. Metoprolol ER 50 b.i.d. to be changed probably to  acebutolol 200      daily.  4. Zegerid 40 daily.  5. Vytorin 10/40.   EXAMINATION:  Her exam is remarkable for an overweight white female in  no distress. Affect is appropriate. Weight is 205.  VITAL SIGNS:  Blood pressure is 120/80, pulse 67 and regular,  respiratory rate 12, afebrile.  HEENT:  Unremarkable.  NECK:  Carotids are normal without bruit. No lymphadenopathy,  thyromegaly or JVP elevation.  LUNGS:  Clear.  Good diaphragmatic  motion, no wheezing.  CARDIAC:  S1, S2 with distant heart sounds, PMI not palpable.  ABDOMEN:  Benign, bowel sounds positive. No hepatosplenomegaly or  hepatojugular reflux, no tenderness.  EXTREMITIES:  Distal pulses are intact, no edema.  NEURO:  Nonfocal.  SKIN:  Warm and dry.  MUSCULOSKELETAL:  No muscular weakness.   Her EKG shows sinus rhythm with nonspecific ST, T wave changes, poor R  wave progression, left axis deviation.   IMPRESSION:  1. Hypercholesterolemia.  Check lipid and liver profile. Continue      Vytorin 10/40, low cholesterol diet  2. History of PSVT with palpitations, resolved. Change beta blocker to      less lipophilic one to see if dreams improve, currently stable.  3. Diabetes with multiple coronary risk factors.  Negative Myoview      this year.  Followup Myoview every 2-3 years.  4. Diabetes. Followup hemoglobin A1C quarterly; followup with Dr.      Hetty Ely in regards to starting  ARB or ACE inhibitor back for renal      protective effects.  5. Back problems, recently started on a muscle relaxant per Dr.      Hetty Ely.  I told her she should continue this and that it would      not affect her heart. She would do better to lose weight and get      involved with a physical activity program.   She did ask about my father-in-law today. Apparently she grew up and  went to school with my father-in-law, Larene Beach and we talked about  this and [address] a little bit.  I will see Scott back in about  6 months and she will call me if the new beta blocker is not helping in  regards to her dreams, and also I will look up her recent lipid and  liver profile that Dr. Hetty Ely checked.     [name]. [name], MD, [facility]  Electronically Signed    PCN/MedQ  DD: 03/25/2007  DT: 03/25/2007  Job #: [id number]   cc:   Arta Silence, MD

## 2010-09-27 NOTE — Assessment & Plan Note (Signed)
[facility]                            CARDIOLOGY OFFICE NOTE   Maria [name], [name]                     MRN:          [id number]  DATE:04/01/2008                            DOB:          April 18, 1941    Maria [name] [name] returns today for followup.  She has had atypical chest pain.  She continues to have muscular-type pain in the center of her chest, it  is not necessarily exertional.  Her last Myoview was July 23, 2006, and  was normal.   She does have coronary risk factors, which include hypertension and  hypercholesterolemia.  Unfortunately, she is not real strict with her  diet.  Her hemoglobin A1c apparently is in the high 6s.   Her last LDL cholesterol was 123.  Her last LDL cholesterol was  excellent at 77 on Vytorin.   She has a bit of problem with her diabetes.  She is intolerant to  METFORMIN, she gets severe diarrhea.  She thinks her morning sugars were  little high by Dr. Hetty [name], he has been little bit hesitant to increase  her glyburide at night to avoid hypoglycemia.  Otherwise, she is doing  well.  She gets occasional exertional dyspnea, which is functional.  She  has no active wheezing.  No history of DVT or PE.   REVIEW OF SYSTEMS:  Otherwise negative.   MEDICATIONS:  1. Atacand 4 mg a day.  2. Coreg 10 a day.  3. Januvia 100 a day.  4. Nexium 40 a day.  5. Vytorin 10/40.  6. Atacand and glyburide 5 in the morning and 2.5 at night.  7. We did previously change her beta-blocker to Coreg.  She has a      problem with dreaming, this has been a lifelong issue and changing      her beta-blocker to a less lipophilic agent, did note nothing to      change this.   PHYSICAL EXAMINATION:  GENERAL:  An overweight white female in no  distress.  VITAL SIGNS:  Her weight is 208, blood pressure 140/80, pulse 74 and  regular, respiratory 14, and afebrile.  HEENT:  Unremarkable.  Carotids normal without bruit.  No  lymphadenopathy, thyromegaly,  or JVP elevation.  LUNGS:  Clear.  Good diaphragmatic motion.  No wheezing.  CARDIAC:  S1 and S2.  Normal heart sounds.  PMI normal.  ABDOMEN:  Benign.  Bowel sounds positive.  No AAA, no tenderness, no  bruit, no hepatosplenomegaly or hepatojugular reflux. EXTREMITIES:  Distal pulses are intact.  No edema.  NEUROLOGIC:  Nonfocal.  SKIN:  Warm and dry.  MUSCULOSKELETAL:  No muscular weakness.   IMPRESSION:  1. Chest pain, atypical, normal Myoview last year.  No indication for      further workup.  Continue aspirin.  2. Hypertension, currently well controlled in the setting of diabetes.      Continue Atacand.  3. Hypercholesterolemia, currently under good control.  Liver function      tests normal.  Continue Vytorin.  4. Diabetes, needs better diet control.  Followup hemoglobin A1c      quarterly.  Consider increasing glyburide to 5 mg in the morning      and 5 at night since her fasting sugars were running a little high.  5. History of reflux.  Continue Nexium.  Avoid late-night meals and      overeating, particularly recumbency late at night.  6. Overall, I think the patient is doing well since she is diabetic, I      would probably recommend a stress test in 2 years so long as she      remains relatively asymptomatic or has only atypical symptoms.  I      did review her EKG today and it was normal with poor R-wave      progression, left axis deviation.     Maria [name] [name]. Maria Emms, MD, [facility]  Electronically Signed    PCN/MedQ  DD: 04/01/2008  DT: 04/01/2008  Job #: [id number]

## 2010-09-27 NOTE — Assessment & Plan Note (Signed)
[facility]                            CARDIOLOGY OFFICE NOTE   Maria [name], Maria [name]                     MRN:          [id number]  DATE:10/01/2006                            DOB:          06-20-1940    Maria [name] is seen today in followup. She had previously been seen by  Marcelino Duster, on of our PA's  I do not think that I had seen her since 2005.  The patient was at [facility] I believe in March for SVT.  She broke  with adenosine.  She had some atypical chest pain and palpitations.  Her  Myoview study was normal on March 10.  Her echocardiogram was normal in  followup with an EF of 70%, trivial MR with some lipomatous hypertrophy  to the atrial septum. The patient has been doing fairly well since she  last saw Marcelino Duster on July 24, 2006. She continues to get occasional  palpitations, but they are infrequent.  They are not prolonged, and they  do not require therapy.  There is no associated shortness of breath.  The patient's chest pain is atypical.  It is substernal to the left  lower rib.  It is not exertional.  It is sharp.  There is no associated  diaphoresis.  It has actually been improving over the last few weeks  with less frequency.   The patient is a diabetic.  [name]'s last note indicated, if her  palpitations and chest pain persisted, she would need heart  catheterization and event monitor.  I had a long discussion with [name]  since her symptoms are improving and she had such a good looking Myoview  and echocardiogram, I did not think these two tests were warranted at  this time.   Her Review of Systems is otherwise negative.   The patient's last hemoglobin A1c was 6.5.   MEDICATIONS:  1. Vytorin 10/40.  2. Januvia 100 a day.  3. Aspirin 1 a day.  4. Lopressor 50 b.i.d.  5. Zegerid.   PHYSICAL EXAMINATION:  GENERAL:  Exam is remarkable for a healthy-  appearing, middle-age female in no distress.  Affect is normal.  VITAL  SIGNS:  Blood pressure 130/80, pulse 72 and regular.  She is  afebrile.  Respiratory rate is 14.  HEENT:  Normal.  NECK:  Supple.  There is no thyromegaly, no lymphadenopathy, no JVP  elevation, and no bruits.  LUNGS:  Clear with good diaphragmatic motion.  There is S1 and S2 with  normal heart sounds.  PMI is normal.  ABDOMEN:  Benign.  There is no hepatosplenomegaly, no hepatojugular  reflux.  Bowel sounds are positive.  There are no masses and no  tenderness.  EXTREMITIES:  Femorals are +3 bilaterally with no bruit.  PT pulses are  palpable bilaterally.  There is no edema and no varicosities.  NEUROLOGIC:  Nonfocal.  MUSCULOSKELETAL:  Exam is normal with no weakness.   IMPRESSION:  Stable diabetic with hypercholesterolemia and hypertension  but currently nonischemic Myoview with atypical pain.  No indication for  catheterization.  Continue to follow clinically.   The  patient has hypertension, currently being treated with beta  blockers.  She had been on ACE inhibitor in the past; namely, Micardis.  I do not know why this stopped, but I will leave it up to Dr. Hetty Ely  to see if he wants to reinstitute an ARB to decrease proteinuria given  her diabetes.   In regards to palpitations, she will continue Toprol b.i.d.  She will  call us if her palpitations were to become more persistent to get an  event monitor.  However, clinically she is not having any recurrent SVT.  Overall, I think she is doing fine.  As long as her symptoms continue to  improve and remain atypical, I will see her back in 6 months.     [name]. [name], MD, [facility]  Electronically Signed    PCN/MedQ  DD: 10/01/2006  DT: 10/01/2006  Job #: [id number]

## 2010-09-30 NOTE — Discharge Summary (Signed)
Maria [name], Maria [name]              ACCOUNT NO.:  [id number]   MEDICAL RECORD NO.:  [id number]          PATIENT TYPE:  INP   LOCATION:  3039                         FACILITY:  [facility]   PHYSICIAN:  Stefani Dama, M.D.  DATE OF BIRTH:  Jun 22, 1940   DATE OF ADMISSION:  10/03/2005  DATE OF DISCHARGE:  10/05/2005                                 DISCHARGE SUMMARY   ADMITTING DIAGNOSIS:  Cervical spondylosis with cervical radiculopathy.   DISCHARGE/FINAL DIAGNOSIS:  Cervical spondylosis with cervical  radiculopathy.   CONDITION ON DISCHARGE:  Improving.   HOSPITAL COURSE:  Maria [name] is a 70 year old individual who has had  significant neck, shoulder and arm pain.  She has profound spondylitic  degeneration at the C5-C6 level.  She also has less profound but significant  disk degeneration at C4-5 and after careful consideration of her options,  she was advised regarding surgical decompression, arthrodesis at C4-5 and C5-  6.  This procedure was undertaken on Oct 03, 2005.  She tolerated procedure  well.  Postoperatively she has been given some pain medication for Percocet,  Valium 5 mg #30 was written for muscle spasm medication.  She is ambulatory  and neurologically is intact and is improving steadily.  The patient is  discharged home at this time and will be seen in three weeks' time for  further follow-up.      Stefani Dama, M.D.  Electronically Signed     HJE/MEDQ  D:  10/05/2005  T:  10/05/2005  Job:  [id number]

## 2010-09-30 NOTE — Op Note (Signed)
NAME:  Maria [name], Maria [name]                        ACCOUNT NO.:  [id number]   MEDICAL RECORD NO.:  [id number]                   PATIENT TYPE:  OIB   LOCATION:  2899                                 FACILITY:  [facility]   PHYSICIAN:  Stefani Dama, M.D.               DATE OF BIRTH:  1940/06/29   DATE OF PROCEDURE:  07/15/2002  DATE OF DISCHARGE:                                 OPERATIVE REPORT   PREOPERATIVE DIAGNOSES:  L3-4 herniated nucleus pulposus on left with left  lumbar radiculopathy.   POSTOPERATIVE DIAGNOSES:  L3-4 herniated nucleus pulposus on left with left  lumbar radiculopathy.   PROCEDURE:  Lumbar microendoscopic diskectomy with met-RX system operating  microscope and microdissection technique.   SURGEON:  Stefani Dama, M.D.   FIRST ASSISTANT:  Payton Doughty, M.D.   ANESTHESIA:  General endotracheal.   INDICATIONS FOR PROCEDURE:  The patient is a 70 year old individual whose  had severe left lower extremity pain and back pain. She was found to have a  large extruded fragment of disk at the L3-4 level on the left side. She has  weakness in the quadriceps. She was advised regarding surgical  decompression.   DESCRIPTION OF PROCEDURE:  The patient was brought to the operating room  supine on the stretcher. After a smooth induction of general endotracheal  anesthesia, she was turned prone, the back was shaved and prepped with  Duraprep and draped in a sterile fashion. A left sided incision was created  overlying the L3-4 interspace after this area was localized with  fluoroscopy. A K wire was placed down to the laminar arch of L3. Then using  a wanding technique, a series of dilators were placed over the K wire to  expand the space to the 18 mm diameter. An 18 mm x 5 cm deep cannula was  then placed into the interspace and secured to the operating table with the  clamp. Then using the operating microscope through this aperture, a  laminotomy was created by first  dissecting the soft tissues using monopolar  cautery and then using a combination of curettes, rongeurs, and a high speed  air drill to remove the inferior marginal lamina of L3 out to the mesial  wall of the facet. The yellow ligament was taken up in this area and once  the yellow ligament was opened completely the common dural tube was  identified. The tube in the lateral aspect was noted to be pressed dorsally  and dissection of this with retraction of the tube identified a mass which  was glistening in nature. There was a slight amount of fibrosis over this  mass and it was found to in fact be a large fragment of disk material. This  fragment was removed singularly and then several other fragments were  identified. The common dural tube was then retracted medially. The space was  then explored and another large fragment  of disk was found distal to the  takeoff of the L4 nerve roots. This was removed. This allowed for good  decompression of the common dural tube and the L4 nerve root. Superiorly  there was found to be a piece of loose ligament which was attached to the  end of the disk space. The disk space could be entered through this aperture  and then using a combination of curettes and rongeurs, the disk space was  evacuated of a significant amount of degenerated disk material. The ligament  itself was allowed to remain intact over the disk space. In the end,  hemostasis was achieved with bipolar cautery and some small pledgets of  Gelfoam which were irrigated away. The common dural tube was well  decompressed as was the L4 nerve root. The area was checked for hemostasis  in all aspects and then the  endoscopic cannula was removed and 3-0 Vicryl was placed in the fascia and 3-  0 Vicryl was placed in the subcuticular tissues with Dermabond on the skin.  The patient tolerated the procedure well, blood loss was estimated at 100  mL./                                                Stefani Dama, M.D.    Merla Riches  D:  07/15/2002  T:  07/15/2002  Job:  [id number]

## 2010-09-30 NOTE — Consult Note (Signed)
Maria [name], Maria [name] NO.:  [id number]   MEDICAL RECORD NO.:  [id number]          PATIENT TYPE:  EMS   LOCATION:  MAJO                         FACILITY:  [facility]   PHYSICIAN:  Salvadore Farber, MD  DATE OF BIRTH:  Dec 12, 1940   DATE OF CONSULTATION:  07/10/2006  DATE OF DISCHARGE:  07/10/2006                                 CONSULTATION   PRIMARY CARDIOLOGIST:  Dr. Charlton Haws.   PRIMARY CARE PHYSICIAN:  Dr. Hetty Ely.   PRIMARY ENDOCRINOLOGIST:  Dr. Gregary Signs A. Everardo All, MD.   PATIENT PROFILE:  A 70 year old Caucasian female with a history of  palpitations who presents today with tachypalpitations and SVT.   PROBLEMS:  1. Supraventricular tachycardia.      a.     Broke with 12 mg of Adenosine here in the emergency       department.  2. History of presyncope.  3. History of palpitations and dyspnea.      a.     October 22, 2003 Exercise Myoview, exercise time 4 minutes 32       seconds, maximum heart rate 155 beats per minute.  Echocardiogram       revealed 0.5-mm ST segment depression in lead 2 and 1-mm ST       segment depression in leads 3 and aVF.  However, perfusion imaging       revealed no evidence of ischemia with an ejection fraction of 83%.  4. Hypertension.  5. Hyperlipidemia.  6. Type 2 diabetes mellitus.  7. History of multinodular goiter/thyroid nodules status post biopsy      in April 2007 which was benign.  8. History of renal cyst.  9. History of low back pain status post diskectomy several years ago      and more recently a cervical diskectomy.   HISTORY OF PRESENT ILLNESS:  A 70 year old Caucasian female with a  history of palpitations, chest pain, dyspnea in 2005 who had a negative  Myoview at that time.  Over the years, she has experienced rare  intermittent tachypalpitations lasting just a few seconds and resolving  spontaneously.  These are generally not limiting.  She has been having  some abdominal and back discomfort and had a CT of her  abdomen this  morning and afterwards ate a biscuit and went on to work.  Her morning  was uneventful.  She does not drink caffeine.  While sitting at work,  she developed tachypalpitations with associated 4 out of 10 mid-chest  and mid-scapular discomfort with mild shortness of breath.  Coworker had  an automated blood pressure cuff and placed it on her arm, and her heart  rate was noted to be elevated in the 160s.  EMS was called, and she was  taken to [facility] ED where she was found to be in SVT and was treated  with Adenosine 12 mg IV x1 with conversion to sinus rhythm.  She has  been hypertension here in the ED with a blood pressure of 172/96.  She  notes that her blood pressure at home is typically in the 130s to 140s.  ALLERGIES:  No known drug allergies.   HOME MEDICATIONS:  1. Vytorin 10/10 mg daily.  2. Januvia 100 mg daily.   FAMILY HISTORY:  Mother is alive at age 61 with a history of pacemaker  placement.  Father died in a motor vehicle accident at age 63.  She has  one sister who is alive and well.   SOCIAL HISTORY:  She lives in [place] with her husband.  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  She denies any tobacco, alcohol, or drug use.  She does not routinely  exercise and does not use caffeine.   REVIEW OF SYSTEMS:  Positive for tachypalpitations with mild associated  chest pain and shortness of breath.  She has chronic back pain and has  recently had some abdominal discomfort.  She does have a history of  diabetes which is managed with oral medications.  All other systems are  reviewed and negative.   PHYSICAL EXAMINATION:  She is afebrile.  Heart rate was 175 initially,  down to 110 and then down into the high 80s after emptying her bladder.  Respirations are 18.  Blood pressure on arrival is 191/75 and it is now  172/90.  A pleasant white female in no acute distress.  Awake, alert, and  oriented x3.  NECK:   Normal carotid upstrokes.  No bruits or JVD.  LUNGS:  Respirations are regular and labored, clear to auscultation.  CARDIAC:  Regular S1 and S2.  No S3, S4, or murmurs.  ABDOMEN:  Round, soft, nontender, nondistended.  Bowel sounds present  x4.  EXTREMITIES:  Warm, dry, pink, no clubbing, cyanosis, or edema.  Dorsalis pedis and posterior tibial pulses 2+ and equal bilaterally.   ACCESSORY CLINICAL FINDINGS:  Chest x-ray shows no active disease.  EKG  shows sinus rhythm at a rate of 98 beats per minute with left axis  deviation and no acute ST, T changes.   LABORATORY WORK:  Hemoglobin 13.6, hematocrit 40, WBC 6.9, platelets  361.  Sodium 141, potassium 3.7, chloride 110, CO2 22.4, BUN 8,  creatinine 0.7, glucose 179, CK-MB less than 1.0, troponin-I less than  0.05.   ASSESSMENT AND PLAN:  1. Supraventricular tachycardia converted with Adenosine.  Will check      pulmonary function tests.  Enzymes are negative by point of care      x1.  Will add Toprol XL 50 mg daily and have arranged for followup,      cardiogram as an outpatient on March 10 at 1 p.m.  She will then      follow up with Dr. [name] on March 11 at 10:45 a.m.  2. Hypertension.  She feels her blood pressure runs in the 130s to      140s at home.  Blood pressure is elevated here, and we are adding      Toprol as above.  Advised to check blood pressure daily and record      and bring to visit with Dr. [name].  If blood pressure is still      elevated, plan to add angiotensin-converting enzyme especially in      light of diabetes.  3. Hyperlipidemia.  Continue statin therapy.  She has this followed as      an outpatient by Dr. Hetty Ely.  4. Type 2 diabetes mellitus.  Followed by Dr. Hetty Ely as an      outpatient.  5. Multinodular goiter.  Will check pulmonary function tests  before      she leaves the emergency department today.  She will likely require     followup with Dr. Hetty Ely and potentially with Dr. Everardo All if       pulmonary function tests are abnormal.  These have been normal in      the past.      Nicolasa Ducking, ANP      Salvadore Farber, MD  Electronically Signed    CB/MEDQ  D:  07/10/2006  T:  07/10/2006  Job:  [id number]

## 2010-09-30 NOTE — Op Note (Signed)
NAMEMILES, Maria [name]              ACCOUNT NO.:  [id number]   MEDICAL RECORD NO.:  [id number]          PATIENT TYPE:  INP   LOCATION:  3039                         FACILITY:  [facility]   PHYSICIAN:  Stefani Dama, M.D.  DATE OF BIRTH:  10/31/1940   DATE OF PROCEDURE:  10/03/2005  DATE OF DISCHARGE:                                 OPERATIVE REPORT   PREOPERATIVE DIAGNOSIS:  Cervical spondylosis with myelopathy and  radiculopathy C4-C5 and C5-C6.   POSTOPERATIVE DIAGNOSIS:  Cervical spondylosis with myelopathy and  radiculopathy C4-C5 and C5-C6.   PROCEDURE:  Anterior cervical decompression C4-C5 and C5-C6, arthrodesis  with PEEK spacer and allograft with autograft, C4-C5 and C5-C6, anterior  plate fixation with Alphatec plate C4 to C6.   SURGEON:  Stefani Dama, M.D.   FIRST ASSISTANT:  Hewitt Shorts, M.D.   ANESTHESIA:  General endotracheal.   INDICATIONS:  Mckaila Duffus is a 70 year old individual who has had  significant neck, shoulder and arm pain.  She has evidence of spondylitic  disease at C5-C6 with some anterolisthesis at that level.  She has disk  bulging and left sided foraminal stenosis, particularly at C4-C5.  She had  some left C5 radicular changes and she has myelopathic changes consistent  with dysesthesias into the upper extremities and also considerable neck  pain. Having been followed for last ten years for this process, the patient  notes that her symptoms are becoming increasingly intolerable and she has  now been advised regarding surgical decompression   PROCEDURE:  The patient was brought to the operating room supine on the  stretcher. After the smooth induction of general endotracheal anesthesia,  was placed in a soft collar and the neck was prepped with DuraPrep and  draped in a sterile fashion.  A transverse incision was created in the left  side of neck and this was carried down through the platysma.  The plane  between the  sternocleidomastoid and the strap muscles was dissected bluntly  until the prevertebral space was reached.  The first identifiable disk space  was noted to be that of C4-C5. The longus coli muscle was then dissected off  either side of midline and a self-retaining Caspar retractor was placed  between C4-C5 and C5-C6.  A localizing radiograph confirmed the position at  C4-C% and then the diskectomy was undertaken at C5-C6 first as this was the  area of the worst degeneration.  A 15 blade was used to open the anterior  longitudinal ligament and a combination of curets and rongeurs was used to  evacuate a substantial quantity of severely degenerated disk material.  A  self-retaining disk spreader was placed into the wound once the anterior  portion of disk was removed and so the posterior portion of the disk could  be explored and a large osteophyte was encountered off the inferior margin  of the body of C5.  This was drilled down with the high speed drill and a  2.3 mm dissecting tool, then a 1 mm Kerrison punch could be slipped through  the ligament and portions of the ligament could  be taken up to allow a 2 mm  Kerrison punch to be used to remove the thick and redundant ligamentous  portion.  The superior osteophyte from C6 was also encountered.  This was  drilled down similarly. Lateral osteophytes were noted be quite prominent  and they were drilled down such that uncinate process resection was  performed to allow the decompression laterally.  Once this was performed,  hemostasis was achieved in each of the lateral gutters and inspection was  undertaken to make sure the bone nerve roots were well decompressed.  When  this was assured, the interspace was sized and it was felt that a medium  PEEK spacer measuring 6 mm in height would fit nicely into the interval.  This was filled with a combination of the patient's own bone that was  harvested drilling with some demineralized bone matrix.   C5 had a similar  procedure undertaken here. On the left side, there was noted be substantial  uncinate hypertrophy that was contributing to left sided foraminal stenosis.  This was drilled down and ultimately a similar procedure was performed with  a 1 mm and 2 mm Kerrison punch to open the posterior longitudinal ligament  and ultimately resect all the ventral material.  Once this was removed and  hemostasis was again reestablished, the nerve roots could easily be sounded  out to the lateral recess. The prevertebral space was sized for a 34 mm  plate.  This was fitted with 14 mm screws at C5, variable angle 4 x 14 mm  screws in C4 and C6. Final localization of the hardware and the surgical  construct was obtained with a radiograph. Hemostasis was obtained in the  soft tissues and checked meticulously.  The wound was copiously irrigated  with antibiotic irrigating solution.  Blood loss was less than 100 mL.  She  is returned to the recovery room in stable condition.      Stefani Dama, M.D.  Electronically Signed     HJE/MEDQ  D:  10/03/2005  T:  10/03/2005  Job:  [id number]

## 2010-09-30 NOTE — Assessment & Plan Note (Signed)
[facility]                                 ON-CALL NOTE   Maria [name], Maria [name]                     MRN:          [id number]  DATE:07/20/2006                            DOB:          Jul 20, 1940    Primary cardiologist is Dr. Gala Romney.   I received a call from the [facility] because they had a  patient there that had a [facility] prescription as well as some other  prescriptions, but the name on the prescription was [facility], not  the patient with whom she was currently dealing.  I determined that the  other patient was a [facility], water patient, and we would not have written  any prescriptions for them anyway, so I advised the pharmacist that the  prescription must have been intended for [facility], but somehow was  given to this patient of [facility] by mistake.  Ms. Maria [name] was indeed  discharged today.  She had negative cardiac markers and was discharged  on July 20, 2006 for an outpatient Myoview.  The prescription for  sublingual nitroglycerin.   I am still attempting to contact Maria [name], but have not been able to  do so as yet.  I will offer to call in a prescription for sublingual  nitroglycerin for her to the pharmacy of her choice.      Theodore Demark, PA-C  Electronically Signed      Gerrit Friends. Dietrich Pates, MD, [facility]  Electronically Signed   RB/MedQ  DD: 07/20/2006  DT: 07/21/2006  Job #: [id number]

## 2010-09-30 NOTE — Assessment & Plan Note (Signed)
[name]                            CARDIOLOGY OFFICE NOTE   KLAIR, LEISING                     MRN:          [id number]  DATE:07/24/2006                            DOB:          November 04, 1940    This is a patient of Dr. [name], Dr. Hetty Ely, and Dr. Everardo All.   This is a 70 year old white female patient who presented to [facility] with SVT on February 26 that converted to sinus rhythm with  adenosine.  She was scheduled for followup 2-D echocardiogram but  returned to the hospital with chest tightness that lasted 4-5 minutes  and resolved spontaneously.  She ruled out for an MI and wanted to avoid  cardiac catheterization so was scheduled for an outpatient Myoview.  Myoview scan was performed on July 23, 2006.  The patient only  exercised 3 minutes and stopped due to fatigue.  No chest pain.  She had  no EKG changes.  LV had normal contractility and thickening in all areas  of the myocardium.  Overall LV function was normal with normal  perfusion, ejection fraction 85%.   A 2-D echocardiogram showed normal LV function, ejection fraction 70%,  trivial MR, lipomatous hypertrophy with increased thickness of  intraatrial septum.   The patient says she awakened last night with palpitations, rapid heart  beat.  She states she was taking Toprol twice a day in the hospital and  was sent home on once a day and wondering if this could be part of the  problem.  She continues to have some chest pain, she describes more of a  soreness and ache.  She does have reflux problems for which she sees Dr.  Matthias Hughs and also has had upper and lower back problems for which she  sees Dr. Danielle Dess.  She has chronic aching in her shoulders that comes  around to her chest, so this may be causing it.  With the palpitations,  she denies any dizziness or presyncope.  She just has an uncomfortable  feeling when she is having it.   CURRENT MEDICATIONS:  1. Toprol XL  50 mg daily.  2. Vytorin 10/40 nightly.  3. Januvia 100 mg daily.  4. Aspirin 81 mg daily.  5. Prevacid over-the-counter.   PHYSICAL EXAMINATION:  GENERAL: This is a pleasant 70 year old white  female in no acute distress.  VITAL SIGNS: Blood pressure 150/82, pulse 80, weight 194.  NECK:  Without JVD, HJR, bruit, or thyroid enlargement.  LUNGS:  Clear anterior and posterolateral.  HEART:  Regular rate and rhythm at 80 beats per minute, normal S1 and  S2.  No murmur, rub, bruit, thrill, or heave noted.  ABDOMEN:  Soft without organomegaly, mass, incisions, or abnormal  tenderness.  EXTREMITIES: Without cyanosis, clubbing, or edema.  She has good distal  pulses.   IMPRESSION:  1. Supraventricular tachycardia, converted to normal sinus rhythm with      adenosine.  Continues to have some palpitations.  2. Chest pain, questionable etiology, negative stress Myoview on July 23, 2006, but poor exercise tolerance.  3. Gastroesophageal  reflux disease with exacerbation.  4. Chronic back pain status post cervical diskectomy and low back      surgery, followed by Dr. Danielle Dess.  5. Hypertension.  6. Hyperlipidemia.  7. Diabetes mellitus.  8. History of multinodular goiter, thyroid nodules, status post biopsy      in April 2007, benign.  Thyroid-stimulating hormone stable.   PLAN:  At this time, I have asked the patient to follow up with Dr.  Matthias Hughs concerning her ongoing reflux problems as well as Dr. Danielle Dess to  see if either of these problems could be causing her chest pain.  If not  and she continues to have pain, then we would have to proceed with  cardiac catheterization.  I will increase her Toprol to 50 mg b.i.d.  I  have asked her to call if this does not cause her palpitations to abate,  and we will need to adjust her medications further and put on an event  recorder.  She will follow up with Dr. [name] in one month or sooner if  needed.      Jacolyn Reedy, PA-C   Electronically Signed      Rollene Rotunda, MD, [facility]  Electronically Signed   ML/MedQ  DD: 07/24/2006  DT: 07/24/2006  Job #: [id number]

## 2010-09-30 NOTE — H&P (Signed)
[name], [name]              ACCOUNT NO.:  [id number]   MEDICAL RECORD NO.:  [id number]          PATIENT TYPE:  INP   LOCATION:  1823                         FACILITY:  [facility]   PHYSICIAN:  Bevelyn Buckles. Bensimhon, MDDATE OF BIRTH:  July 19, 1940   DATE OF ADMISSION:  07/19/2006  DATE OF DISCHARGE:                              HISTORY & PHYSICAL   PRIMARY CARE PHYSICIAN:  Arta Silence, MD   REASON FOR ADMISSION:  Chest pain.   HISTORY OF PRESENT ILLNESS:  Ms. Maria [name] is a 70 year old woman with no  known history of coronary artery disease.  She does have a history of  diabetes, hypertension, hyperlipidemia, and obesity.  She presented to  the emergency room last week with chest pain and rapid pulse.  She was  found to have SVT which apparently was broken with adenosine.  She was  set up to follow up with an echocardiogram and see Dr. [name].  Since  that time she said her chest just has not felt right.  Last night she  noted some tightness in her chest.  Throughout today she has had six or  seven episodes of chest pain which she describes as tightness radiating  to her back.  It can last up to 5 minutes.  They are not worse with  exertion.  She does feel a little bit weak but denies shortness of  breath.  She has a little bit of nausea with one episode but no  diaphoresis.  She does also have a history of significant reflux disease  and has been on Zegerid in the past but is not taking it.  She came to  the ER for evaluation.  She is now painfree.  EKG shows sinus rhythm  without any ST-T wave changes.  First set of markers are normal.   REVIEW OF SYSTEMS:  Notable for fatigue and insomnia.  No vomiting.  No  bright red blood per rectum.  No melena.  No postprandial abdominal  pain.  No diarrhea.  No fevers.  No chills.  No heart failure symptoms.  The remainder of review of systems is negative except for HPI and  problem list.   PAST MEDICAL HISTORY:  Diabetes times about 9  years, hypertension,  hyperlipidemia, obesity, supraventricular tachycardia.   PAST SURGICAL HISTORY:  She has had neck and back surgery.   SOCIAL HISTORY:  She lives in [place] with her husband.  She is a  retired Hotel manager for [employer].  She denies any  history of tobacco.  She does not drink alcohol.   FAMILY HISTORY:  Her father died at age 18 due to motor vehicle  accident.  He did have a history of congenital heart disease which  sounds like perhaps a patent ductus arteriosus.  Mother is alive at 37.  She has a history of a pacemaker but no known coronary artery disease.  Has one sister with no heart disease.   MEDICATIONS:  Januvia, metoprolol, Vytorin, and aspirin.   ALLERGIES:  SHE GETS SEVERE GASTROINTESTINAL UPSET WITH NONSTEROIDAL  ANTI-INFLAMMATORIES.   PHYSICAL EXAMINATION:  GENERAL:  She is lying flat in bed.  No acute  distress.  Respirations are unlabored.  VITAL SIGNS:  Blood pressure initially 179/91 with a heart rate of 72.  She is afebrile at 98.1 saturating 99% on room air.  HEENT:  Sclerae anicteric.  EOMI.  There is no xanthelasma.  Mucous  membranes are moist.  Oropharynx is clear.  NECK:  Neck is supple.  No JVD.  Carotids are 2+ bilaterally without any  bruits.  There is no lymphadenopathy or thyromegaly.  CARDIAC:  Regular rate and rhythm.  No murmurs, rubs, or gallops.  LUNGS:  Clear.  ABDOMEN:  Obese, nontender, nondistended, no hepatosplenomegaly, no  bruits, no masses, good bowel sounds.  EXTREMITIES:  Warm with no cyanosis, clubbing, or edema.  Distal pulses  are 2+ bilaterally.  There are no rashes.  NEURO:  Alert and oriented x3.  Cranial nerves II-XII are intact.  She  moves all four extremities without difficulty.   STUDIES:  Chest x-ray is normal.  EKG shows sinus rhythm with no  significant ST-T wave abnormalities.   Sodium 139, potassium 4.6, chloride 108, BUN of 10, glucose 164,  creatinine 0.9.   Point-of-care markers:  Troponin is less than 0.05, MB  is less than 1.0.   ASSESSMENT AND PLAN:  1. Chest pain.  This has both typical and atypical features.  Given      the fact that she does have multiple cardiac risk factors, I      discussed with her the options of cardiac catheterization versus      stress testing.  She got very anxious at the thought of      catheterization and much prefers stress testing.  We will admit her      for 23-hour observation.  If her cardiac markers and EKG remain      normal, she can discharge home with an outpatient Myoview.  If her      EKG changes or she has positive markers then obviously we would re-      discuss with her possibility of pursuing cardiac catheterization.  2. Hypertension.  This is suboptimally controlled.  We will increase      her beta blocker.  3. Hyperlipidemia.  Continue Vytorin.      Bevelyn Buckles. Bensimhon, MD  Electronically Signed     DRB/MEDQ  D:  07/19/2006  T:  07/19/2006  Job:  [id number]   cc:   Arta Silence, MD

## 2010-09-30 NOTE — Discharge Summary (Signed)
Maria [name], Maria [name]              ACCOUNT NO.:  [id number]   MEDICAL RECORD NO.:  [id number]          PATIENT TYPE:  INP   LOCATION:  3737                         FACILITY:  [facility]   PHYSICIAN:  Bevelyn Buckles. Bensimhon, MDDATE OF BIRTH:  08-05-1940   DATE OF ADMISSION:  07/19/2006  DATE OF DISCHARGE:  07/20/2006                               DISCHARGE SUMMARY   PRIMARY CARDIOLOGY:  Dr. Charlton Haws   PRIMARY CARE Kiahna Banghart:  Dr. Hetty Ely   PRIMARY ENDOCRINOLOGIST:  Dr. Romero Belling   PRINCIPAL DIAGNOSIS:  Chest pain.   SECONDARY DIAGNOSES:  1. History of supraventricular tachycardia July 10, 2006 that      broke with adenosine.  2. History of presyncope.  3. History of palpitations and dyspnea with negative Myoview in June      of 2005.  4. Hypertension.  5. Hyperlipidemia.  6. Type 2 diabetes mellitus.  7. History of multinodular goiter/thyroid nodules status post biopsy      April of 2007 which was benign.  8. History of renal cyst.  9. History of low back pain status post discectomy several years ago      and more recently a cervical discectomy.   ALLERGIES:  No known drug allergies.   PROCEDURE:  None.   HISTORY OF PRESENT ILLNESS:  A 70 year old Caucasian female with the  above problem list who was originally seen in the [facility] ED July 10, 2006 for SVT that broke with adenosine.  Following this, she was set  up for a 2-D echocardiogram to take place March 10 and subsequent  followup with Dr. Charlton Haws on March 11.  Unfortunately, she has been  having intermittent episodes of chest discomfort, generally occurring at  rest lasting four or five minutes and resolving spontaneously.  She has  not had any exertional symptoms.  Yesterday while at work, she had a  recurrent episode of chest discomfort associated with shortness of  breath and nausea prompting her to present to the [facility] ED.  In the  ED, she was pain free and ECG was without any acute  changes.  Cardiac  enzymes were negative and she was admitted for observation.   HOSPITAL COURSE:  The patient was ruled out for MI and had no recurrent  symptoms.  She would favor to avoid catheterization at this point;  therefore, we have set her up for an outpatient Myoview on March 10 at  12:45 a.m.  She is being discharged home today in satisfactory  condition.   DISCHARGE LABS:  Hemoglobin 12.5, hematocrit 36.1, WBC 7.7, platelets  310, sodium 140, potassium 4.3, chloride 106, CO2 27, BUN 10, creatinine  26, glucose 133, total bilirubin 0.6, alkaline phosphatase 84, AST 18,  ALT 28, total protein 6.0, albumin 4.0, calcium 9.3, magnesium 2.1,  hemoglobin A1c 7.0, cardiac enzymes negative x3, total cholesterol 37,  triglycerides 123, HDL 35, LDL 77.   DISPOSITION:  The patient is being discharged home today in good  condition.   FOLLOWUP PLANS AND APPOINTMENTS:  She has a followup 2-D echocardiogram  and exercise Myoview on July 23, 2006 at 12:45 p.m. at [facility]  She will follow up with Dr. Charlton Haws on March 11 at 10:45  a.m.   DISCHARGE MEDICATIONS:  1. Aspirin 81 mg q.d.  2. Toprol XL 50 mg q.d.  3. Vytorin 10/40 mg q.d.  4. Januvia 100 mg q.d.  5. Nitroglycerin 0.4 mg sublingual p.r.n. chest pain.   OUTSTANDING LABS AND STUDIES:  None.   Duration of discharge encounter including physician time.      Nicolasa Ducking, ANP      Bevelyn Buckles. Bensimhon, MD  Electronically Signed    CB/MEDQ  D:  07/20/2006  T:  07/20/2006  Job:  [id number]   cc:   Gregary Signs A. Everardo All, MD  Arta Silence, MD

## 2010-10-04 ENCOUNTER — Ambulatory Visit: Payer: Medicare Other | Admitting: Gastroenterology

## 2010-12-05 ENCOUNTER — Other Ambulatory Visit: Payer: Self-pay | Admitting: Gastroenterology

## 2010-12-05 LAB — HM COLONOSCOPY

## 2010-12-12 ENCOUNTER — Other Ambulatory Visit (INDEPENDENT_AMBULATORY_CARE_PROVIDER_SITE_OTHER): Payer: Medicare Other | Admitting: Family Medicine

## 2010-12-12 ENCOUNTER — Encounter: Payer: Self-pay | Admitting: Cardiovascular Disease

## 2010-12-12 DIAGNOSIS — E119 Type 2 diabetes mellitus without complications: Secondary | ICD-10-CM

## 2010-12-14 HISTORY — PX: CATARACT EXTRACTION W/ INTRAOCULAR LENS  IMPLANT, BILATERAL: SHX1307

## 2010-12-15 ENCOUNTER — Encounter: Payer: Self-pay | Admitting: Family Medicine

## 2010-12-15 ENCOUNTER — Ambulatory Visit (INDEPENDENT_AMBULATORY_CARE_PROVIDER_SITE_OTHER): Payer: Medicare Other | Admitting: Family Medicine

## 2010-12-15 DIAGNOSIS — E119 Type 2 diabetes mellitus without complications: Secondary | ICD-10-CM

## 2010-12-15 MED ORDER — GLYBURIDE 5 MG PO TABS: 5.0000 mg | ORAL_TABLET | Freq: Two times a day (BID) | ORAL | Status: DC

## 2010-12-15 NOTE — Patient Instructions (Signed)
Keep a track of your morning sugars and let me know how you are doing in about 1 month, sooner if needed.  Glad to see you.  Take care.  We can arrange follow up after I get your sugar readings.

## 2010-12-15 NOTE — Assessment & Plan Note (Signed)
She wants to avoid [place].  I think that uncontrolled diabetes is a higher risk.  We talked about it.  >25 min spent with face to face with patient, >50% counseling.  She can't take metformin.  She'll cut back on sweets, inc exercise and keep track of sugars.  Unless she is much improved, consider starting lantus pen with titration of AM dose.  She agrees to notify me of sugars.

## 2010-12-15 NOTE — Progress Notes (Signed)
She's doing some better with the adjustment to being a widow.  Her grandson is moving in with her.  This will help at home.  Overall, she is improved and mood is stable.   She followed up with Dr. Jillyn Hidden about her shoulder.  She had steroid injection and is going back for MRI.  I'll await ortho input.  Diabetes:  Using medications without difficulties:yes, but she quit [place].  She was worried about pancreatic cancer risk.  We talked about this.  Hypoglycemic episodes:no Hyperglycemic episodes:occ Feet problems:no Blood Sugars averaging: ~200 eye exam within last year:she's going soon She can't take metformin. We had a long discussion about options. She's trying to work on diet.  She's trying to eat for herself- cooking for 1- and this my change in the near future.  She going to start walking some more since her knee is better.    PMH and SH reviewed  Meds, vitals, and allergies reviewed.   ROS: See HPI.  Otherwise negative.    GEN: nad, alert and oriented HEENT: mucous membranes moist NECK: supple w/o LA CV: rrr. PULM: ctab, no inc wob ABD: soft, +bs EXT: no edema SKIN: no acute rash  Diabetic foot exam: Normal inspection No skin breakdown No calluses  Normal DP pulses Normal sensation to light touch and monofilament Nails normal

## 2010-12-16 ENCOUNTER — Encounter: Payer: Self-pay | Admitting: Cardiovascular Disease

## 2010-12-16 ENCOUNTER — Ambulatory Visit (INDEPENDENT_AMBULATORY_CARE_PROVIDER_SITE_OTHER): Payer: Medicare Other | Admitting: Cardiovascular Disease

## 2010-12-16 DIAGNOSIS — E785 Hyperlipidemia, unspecified: Secondary | ICD-10-CM

## 2010-12-16 DIAGNOSIS — E119 Type 2 diabetes mellitus without complications: Secondary | ICD-10-CM

## 2010-12-16 DIAGNOSIS — I471 Supraventricular tachycardia: Secondary | ICD-10-CM

## 2010-12-16 DIAGNOSIS — I10 Essential (primary) hypertension: Secondary | ICD-10-CM

## 2010-12-16 NOTE — Assessment & Plan Note (Signed)
HbA1c with Dr Para March next month.  Insulin is next step.  Discussed issues of carbs in diet and weight loss

## 2010-12-16 NOTE — Assessment & Plan Note (Signed)
Tolerating low dose crestor.  F/U labs per primary

## 2010-12-16 NOTE — Assessment & Plan Note (Signed)
Well controlled.  Continue current medications and low sodium Dash type diet.    

## 2010-12-16 NOTE — Patient Instructions (Signed)
Your physician recommends that you schedule a follow-up appointment in: YEAR WITH DR [name]  Your physician recommends that you continue on your current medications as directed. Please refer to the Current Medication list given to you today. 

## 2010-12-16 NOTE — Progress Notes (Signed)
Tomorrow is seen today for F/U of SVT, palpitations, HTN and elevated lipids. She has no documented CAD.and a negative myovue in 2008. She is having stable palpitaitons. They occur especially at night when she is trying to read. She sometimes has relief with coughing. She denies SOB, diaphoresis or syncope. She has had documented SVT in the past Rx with adenosine. She has been compliant with her meds including BB. Both lopresser and coreg have caused nightmares She is tolerating Atenolol very well. Has been intolerant to vytorin. LDL in 10/11 was 168 Will try crestor 5 mg every other day.   BS poorly controlled Did not want to take Januvia for a while due to risk of pancreatic cancer.  Had to hold meds for colonoscopy.  May need to start insulin   ROS: Denies fever, malais, weight loss, blurry vision, decreased visual acuity, cough, sputum, SOB, hemoptysis, pleuritic pain, palpitaitons, heartburn, abdominal pain, melena, lower extremity edema, claudication, or rash.  All other systems reviewed and negative  General: Affect appropriate Healthy:  appears stated age HEENT: normal Neck supple with no adenopathy JVP normal no bruits no thyromegaly Lungs clear with no wheezing and good diaphragmatic motion Heart:  S1/S2 no murmur,rub, gallop or click PMI normal Abdomen: benighn, BS positve, no tenderness, no AAA no bruit.  No HSM or HJR Distal pulses intact with no bruits No edema Neuro non-focal Skin warm and dry No muscular weakness   Current Outpatient Prescriptions  Medication Sig Dispense Refill  . ALPRAZolam (XANAX) 0.25 MG tablet Take 1 or 2 tablets by mouth at bedtime as needed for anxiety.       [name] atenolol (TENORMIN) 50 MG tablet Take 50 mg by mouth daily.        . celecoxib (CELEBREX) 200 MG capsule Take 200 mg by mouth daily as needed.        . glyBURIDE (DIABETA) 5 MG tablet Take 1 tablet (5 mg total) by mouth 2 (two) times daily with a meal.  180 tablet  3  . JANUVIA 100 MG  tablet Take 1 tablet by mouth Daily.      [name] losartan (COZAAR) 50 MG tablet Take 1 tablet (50 mg total) by mouth daily.  30 tablet  5  . omeprazole (PRILOSEC) 20 MG capsule Take 20 mg by mouth daily as needed.        . rosuvastatin (CRESTOR) 5 MG tablet Take 5 mg by mouth every other day.         Allergies  Acetaminophen; Carvedilol; Ezetimibe-simvastatin; Ibuprofen; Metformin; Metoprolol tartrate; and Naproxen  Electrocardiogram:  Assessment and Plan

## 2010-12-16 NOTE — Assessment & Plan Note (Signed)
Quiescent with no clinical recurrence

## 2010-12-19 ENCOUNTER — Ambulatory Visit
Admission: RE | Admit: 2010-12-19 | Discharge: 2010-12-19 | Disposition: A | Discharge status: Home / Self Care | Payer: Medicare Other | Source: Ambulatory Visit | Attending: Family Medicine | Admitting: Family Medicine

## 2010-12-19 ENCOUNTER — Telehealth: Payer: Self-pay | Admitting: *Deleted

## 2010-12-19 DIAGNOSIS — E049 Nontoxic goiter, unspecified: Secondary | ICD-10-CM

## 2010-12-19 MED ORDER — INSULIN PEN NEEDLE 31G X 6 MM MISC: Status: DC

## 2010-12-19 MED ORDER — INSULIN GLARGINE 100 UNIT/ML ~~LOC~~ SOLN: 5.0000 [IU] | Freq: Every day | SUBCUTANEOUS | Status: DC

## 2010-12-19 NOTE — Telephone Encounter (Signed)
Pt saw her cardiologist Fri, and he agrees with her switching to insulin. Pt request rx and wants to come in for nurse visit, for a demo on how to inject insulin.  Uses [facility]

## 2010-12-19 NOTE — Telephone Encounter (Signed)
Please have pt set up a RN visit.  See sig on the lantus rx.  Thanks.  Have her schedule OV with me in 3.5 months with A1c before at visit at lab appointment.  See note on thyroid u/s and notify pt.  Thanks.

## 2010-12-20 ENCOUNTER — Ambulatory Visit (INDEPENDENT_AMBULATORY_CARE_PROVIDER_SITE_OTHER): Payer: Medicare Other | Admitting: Family Medicine

## 2010-12-20 DIAGNOSIS — E119 Type 2 diabetes mellitus without complications: Secondary | ICD-10-CM

## 2010-12-20 NOTE — Telephone Encounter (Signed)
Patient notified of rxs. Nurse visit scheduled. Patient said that she will call back to schedule OC w/ labs prior.

## 2010-12-21 NOTE — Progress Notes (Signed)
  Subjective:    Patient ID: Maria [name], female    DOB: 1940-09-09, 70 y.o.   MRN: [id number]  HPI    Review of Systems     Objective:   Physical Exam        Assessment & Plan:  Noted, thanks.

## 2010-12-21 NOTE — Progress Notes (Signed)
Patient came in on 12/20/2010 for [name] to demonstrate to her how to use her Lantus solostar insulin pen.  The patient had never given herself an insulin injection before but brought her insulin pen with her that she picked up from the pharmacy.  I demonstrated to the patient how to properly clean her skin before each injection, where to inject herself, and how to dispose of the needle when she is done.  Patient demonstrated her understanding and verbalized it as well.  She thanked me for taking the time to show her how to use the insulin pen.  I advised her to call our office with any questions and advised her to start to keep a blood sugar log.

## 2011-01-03 ENCOUNTER — Encounter: Payer: Self-pay | Admitting: Family Medicine

## 2011-01-04 ENCOUNTER — Encounter: Payer: Self-pay | Admitting: Family Medicine

## 2011-01-05 ENCOUNTER — Encounter: Payer: Self-pay | Admitting: Family Medicine

## 2011-02-08 ENCOUNTER — Other Ambulatory Visit: Payer: Self-pay | Admitting: Obstetrics and Gynecology

## 2011-02-13 ENCOUNTER — Telehealth: Payer: Self-pay | Admitting: *Deleted

## 2011-02-13 DIAGNOSIS — E119 Type 2 diabetes mellitus without complications: Secondary | ICD-10-CM

## 2011-02-13 DIAGNOSIS — E1159 Type 2 diabetes mellitus with other circulatory complications: Secondary | ICD-10-CM | POA: Insufficient documentation

## 2011-02-13 MED ORDER — INSULIN DETEMIR 100 UNIT/ML ~~LOC~~ SOLN: 10.0000 [IU] | Freq: Every day | SUBCUTANEOUS | Status: DC

## 2011-02-13 NOTE — Telephone Encounter (Signed)
Pt had been placed on insulin but developed a rash on her face and neck so she stopped the insulin about 4 days ago.  The rash is going away but her blood sugar is staying up- fasting running 250-260.  Taking glyburide twice a day.  She knows she needs to be on insulin, but will need to change to another kind.  Uses [facility].

## 2011-02-13 NOTE — Telephone Encounter (Signed)
I called pt.  She had no airway sx.  The rash is improving.  We talked about options.  She is still interested in the once daily shots and I think she is unlikely to have a similar reaction to levemir.  New rx was sent.  She'll call back with update in a few days, sooner if needed.  I don't expect her to have profound allergic sx, but if she did she would need to go to ER.  She agrees with plan.

## 2011-02-28 ENCOUNTER — Other Ambulatory Visit: Payer: Self-pay | Admitting: *Deleted

## 2011-02-28 MED ORDER — LOSARTAN POTASSIUM 50 MG PO TABS: 50.0000 mg | ORAL_TABLET | Freq: Every day | ORAL | Status: DC

## 2011-05-24 ENCOUNTER — Other Ambulatory Visit: Payer: Self-pay | Admitting: Cardiovascular Disease

## 2011-05-24 MED ORDER — ATENOLOL 50 MG PO TABS: 50.0000 mg | ORAL_TABLET | Freq: Every day | ORAL | Status: DC

## 2011-06-23 ENCOUNTER — Other Ambulatory Visit: Payer: Self-pay

## 2011-06-23 MED ORDER — ROSUVASTATIN CALCIUM 5 MG PO TABS: 5.0000 mg | ORAL_TABLET | ORAL | Status: DC

## 2011-06-28 ENCOUNTER — Other Ambulatory Visit: Payer: Self-pay | Admitting: *Deleted

## 2011-06-28 MED ORDER — INSULIN DETEMIR 100 UNIT/ML ~~LOC~~ SOLN: 10.0000 [IU] | Freq: Every day | SUBCUTANEOUS | Status: DC

## 2011-06-28 MED ORDER — INSULIN PEN NEEDLE 31G X 6 MM MISC: Status: DC

## 2011-08-15 ENCOUNTER — Ambulatory Visit (INDEPENDENT_AMBULATORY_CARE_PROVIDER_SITE_OTHER): Payer: Medicare Other | Admitting: Family Medicine

## 2011-08-15 ENCOUNTER — Encounter: Payer: Self-pay | Admitting: Family Medicine

## 2011-08-15 VITALS — BP 132/82 | HR 74 | Temp 98.8°F | Wt 202.8 lb

## 2011-08-15 DIAGNOSIS — E119 Type 2 diabetes mellitus without complications: Secondary | ICD-10-CM

## 2011-08-15 DIAGNOSIS — M5030 Other cervical disc degeneration, unspecified cervical region: Secondary | ICD-10-CM

## 2011-08-15 MED ORDER — TRAMADOL HCL 50 MG PO TABS: 50.0000 mg | ORAL_TABLET | Freq: Three times a day (TID) | ORAL | Status: AC | PRN

## 2011-08-15 MED ORDER — CELECOXIB 200 MG PO CAPS: 200.0000 mg | ORAL_CAPSULE | Freq: Every day | ORAL | Status: DC | PRN

## 2011-08-15 NOTE — Progress Notes (Signed)
Worried about another herniated disc.  "I thought I had a crick in my neck a few days ago."  Worse over the last 4 days.  Pain laying down.  Pain turning head.  No trauma.  No radicular sx but her neck aches.  No new weakness- h/o weakness from before the prev cervical surgery.  No FCNAVD.   Meds, vitals, and allergies reviewed.   ROS: See HPI.  Otherwise, noncontributory.  ncat nad Mmm rrr ctab Neck w/o midline pain but B paraspinal muscle pain high in C spine.   No rash ROM in neck limited by pain but w/o true meningismus

## 2011-08-15 NOTE — Patient Instructions (Addendum)
Keep adding 1 unit of levemir a day until sugar is less than 140 in AM.  If your sugar is <90 in the AM, then decrease by 1 unit.   Recheck A1c in 3 months and then come see me.   Take the tramadol (it can make you drowsy) and celebrex for the pain.  Call me if not better.  Take care.

## 2011-08-17 NOTE — Assessment & Plan Note (Signed)
Likely with acute flare, but no need to image today.  Will try conservative tx with celebrex and she'll call back if not improving for consideration of imaging.  She agrees.

## 2011-08-31 ENCOUNTER — Other Ambulatory Visit: Payer: Self-pay | Admitting: *Deleted

## 2011-08-31 MED ORDER — LOSARTAN POTASSIUM 50 MG PO TABS: 50.0000 mg | ORAL_TABLET | Freq: Every day | ORAL | Status: DC

## 2011-08-31 NOTE — Telephone Encounter (Signed)
Refilled losartan 

## 2011-09-28 ENCOUNTER — Ambulatory Visit: Payer: Medicare Other | Admitting: Family Medicine

## 2011-10-02 ENCOUNTER — Ambulatory Visit (INDEPENDENT_AMBULATORY_CARE_PROVIDER_SITE_OTHER): Payer: Medicare Other | Admitting: Family Medicine

## 2011-10-02 ENCOUNTER — Encounter: Payer: Self-pay | Admitting: Family Medicine

## 2011-10-02 VITALS — BP 130/76 | HR 82 | Temp 98.5°F | Wt 200.8 lb

## 2011-10-02 DIAGNOSIS — E119 Type 2 diabetes mellitus without complications: Secondary | ICD-10-CM

## 2011-10-02 DIAGNOSIS — Z23 Encounter for immunization: Secondary | ICD-10-CM

## 2011-10-02 MED ORDER — INSULIN PEN NEEDLE 31G X 8 MM MISC: Status: DC

## 2011-10-02 MED ORDER — INSULIN PEN NEEDLE 31G X 6 MM MISC: Status: DC

## 2011-10-02 NOTE — Patient Instructions (Addendum)
Use the 6mm needles vertically.  If not improved, then try the 8mm needles.  You can take plain claritin for the itching, 10mg  a day.

## 2011-10-03 NOTE — Assessment & Plan Note (Signed)
I talked with pharmacy at [facility].  This could be from shallow injection.  Will use 6mm needle perpendicularly, it still with reaction will change to 8mm needle.  Can use claritin in meantime prn.  She agrees, okay to continue med for now.    Also, zostavax given, this should have no relation to the above.

## 2011-10-03 NOTE — Progress Notes (Signed)
H/o DM2.  Had nonlocal rash with lantus but had tolerated levemir.  Now with local injection site reaction.  Will get a wheal of red skin, occ itchy, at the site.  Will resolve after a few days.  Asking about options.  No distant reaction or other symptoms.  No edema or airway sx. Had been using 6mm needles at an angle on the pen.   Due for zostavax.   Meds, vitals, and allergies reviewed.   ROS: See HPI.  Otherwise, noncontributory.  nad ~4cm wheal noted on R thigh at prev injection site

## 2011-11-07 ENCOUNTER — Other Ambulatory Visit (INDEPENDENT_AMBULATORY_CARE_PROVIDER_SITE_OTHER): Payer: Medicare Other

## 2011-11-07 DIAGNOSIS — E119 Type 2 diabetes mellitus without complications: Secondary | ICD-10-CM

## 2011-11-07 LAB — COMPREHENSIVE METABOLIC PANEL
AST: 16 U/L (ref 0–37)
Alkaline Phosphatase: 84 U/L (ref 39–117)
Glucose, Bld: 171 mg/dL — ABNORMAL HIGH (ref 70–99)
Potassium: 4.3 mEq/L (ref 3.5–5.1)
Sodium: 139 mEq/L (ref 135–145)
Total Bilirubin: 0.6 mg/dL (ref 0.3–1.2)
Total Protein: 6.9 g/dL (ref 6.0–8.3)

## 2011-11-07 LAB — LIPID PANEL
Cholesterol: 157 mg/dL (ref 0–200)
LDL Cholesterol: 93 mg/dL (ref 0–99)
Total CHOL/HDL Ratio: 4
VLDL: 23 mg/dL (ref 0.0–40.0)

## 2011-11-07 LAB — TSH: TSH: 2.27 u[IU]/mL (ref 0.35–5.50)

## 2011-11-14 ENCOUNTER — Ambulatory Visit (INDEPENDENT_AMBULATORY_CARE_PROVIDER_SITE_OTHER): Payer: Medicare Other | Admitting: Family Medicine

## 2011-11-14 ENCOUNTER — Encounter: Payer: Self-pay | Admitting: Family Medicine

## 2011-11-14 VITALS — BP 120/68 | HR 64 | Temp 98.3°F

## 2011-11-14 DIAGNOSIS — E785 Hyperlipidemia, unspecified: Secondary | ICD-10-CM

## 2011-11-14 DIAGNOSIS — Z889 Allergy status to unspecified drugs, medicaments and biological substances status: Secondary | ICD-10-CM

## 2011-11-14 DIAGNOSIS — E119 Type 2 diabetes mellitus without complications: Secondary | ICD-10-CM

## 2011-11-14 DIAGNOSIS — E042 Nontoxic multinodular goiter: Secondary | ICD-10-CM

## 2011-11-14 NOTE — Progress Notes (Signed)
Diabetes:  Using medications without difficulties: She's still having itchy injection site reaction and she'll have to skip a few days of injection. Similar itchy reaction with lantus but not local- it was a facial rash.  A1c is still improved.   Hypoglycemic episodes: no Hyperglycemic episodes: no Feet problems: no Blood Sugars averaging: ~175 in AM, then decreased as the day goes on. eye exam within last year: yes  Cholesterol at goal and TSH wnl.   Meds, vitals, and allergies reviewed.   ROS: See HPI.  Otherwise negative.    GEN: nad, alert and oriented HEENT: mucous membranes moist NECK: supple w/o LA CV: rrr. PULM: ctab, no inc wob ABD: soft, +bs EXT: no edema SKIN: no acute rash  Diabetic foot exam: Normal inspection No skin breakdown No calluses  Normal DP pulses Normal sensation to light touch and monofilament Nails normal

## 2011-11-14 NOTE — Assessment & Plan Note (Signed)
Controlled, continue meds

## 2011-11-14 NOTE — Assessment & Plan Note (Signed)
Given local reaction, no change in meds but refer to allergy for input.  claritin prn in meantime.  Okay for outpatient f/u.

## 2011-11-14 NOTE — Assessment & Plan Note (Signed)
TSH wnl.  Recheck episodically.  Not ttp on exam.

## 2011-11-14 NOTE — Patient Instructions (Signed)
See Asher Muir about your referral before you leave today. Call me after you see the allergy clinic so we can set follow up arrangements.   Take care.

## 2011-12-05 ENCOUNTER — Other Ambulatory Visit: Payer: Self-pay | Admitting: Cardiovascular Disease

## 2011-12-05 MED ORDER — LOSARTAN POTASSIUM 50 MG PO TABS: 50.0000 mg | ORAL_TABLET | Freq: Every day | ORAL | Status: DC

## 2011-12-13 ENCOUNTER — Telehealth: Payer: Self-pay | Admitting: Family Medicine

## 2011-12-13 ENCOUNTER — Other Ambulatory Visit: Payer: Self-pay | Admitting: Family Medicine

## 2011-12-13 DIAGNOSIS — E119 Type 2 diabetes mellitus without complications: Secondary | ICD-10-CM

## 2011-12-13 NOTE — Telephone Encounter (Signed)
She can take up to 10mg  twice a day in the meantime.  I would start by taking 10mg  in Am and 5mg  in Pm.  If readings are still high, then go to 10mg  twice a day.  Thanks.

## 2011-12-13 NOTE — Telephone Encounter (Signed)
Patient notified as instructed by telephone. Pt will ck quantity of med and will call back tomorrow if needs refill.

## 2011-12-13 NOTE — Telephone Encounter (Signed)
Since pt stopped insulin around 12/02/11  FBS is between 200-210 and BS at bedtime between 250-260. Pt is still taking Glyburide 5 mg in AM and hs.Please advise.[facility].

## 2011-12-13 NOTE — Telephone Encounter (Signed)
Message from [facility] patient and made her Endo appt with Dr Wendall Mola at [facility] for 01/10/12 . She asked to tell you that she stopped taking her insulin on about 12/02/2011 to see what the new Endo would advise her to do. What should she do until 01/10/2012? Uses [facility] and please tell Lugene to call her back on her cell phone when she calls.    Please call pt and get an update about her recent sugar readings, so I can know what to do in the meantime.  Thanks.

## 2011-12-14 MED ORDER — GLYBURIDE 5 MG PO TABS: 10.0000 mg | ORAL_TABLET | Freq: Two times a day (BID) | ORAL | Status: DC

## 2011-12-14 NOTE — Telephone Encounter (Signed)
Spoke with pt and she request 30 day supply of Glyburide 5 mg taking 2 tabs twice a day to [facility]. Pt does understand she will start by taking 2 tabs in AM and 1 tab in pm and increase to 2 tabs twice a day if needed due to BS readings.

## 2011-12-19 ENCOUNTER — Other Ambulatory Visit: Payer: Self-pay | Admitting: *Deleted

## 2011-12-19 MED ORDER — ATENOLOL 50 MG PO TABS: 50.0000 mg | ORAL_TABLET | Freq: Every day | ORAL | Status: DC

## 2011-12-19 NOTE — Telephone Encounter (Signed)
Pt needs appointment then refill can be made Fax Received. Refill Completed. Rutha Melgoza Chowoe (R.M.A)   

## 2012-01-03 ENCOUNTER — Encounter: Payer: Self-pay | Admitting: Cardiovascular Disease

## 2012-01-03 ENCOUNTER — Ambulatory Visit (INDEPENDENT_AMBULATORY_CARE_PROVIDER_SITE_OTHER): Payer: Medicare Other | Admitting: Cardiovascular Disease

## 2012-01-03 VITALS — BP 141/65 | HR 69 | Resp 18 | Ht 66.0 in | Wt 198.8 lb

## 2012-01-03 DIAGNOSIS — E119 Type 2 diabetes mellitus without complications: Secondary | ICD-10-CM

## 2012-01-03 DIAGNOSIS — I10 Essential (primary) hypertension: Secondary | ICD-10-CM

## 2012-01-03 DIAGNOSIS — E785 Hyperlipidemia, unspecified: Secondary | ICD-10-CM

## 2012-01-03 DIAGNOSIS — I471 Supraventricular tachycardia: Secondary | ICD-10-CM

## 2012-01-03 NOTE — Progress Notes (Signed)
Patient ID: Maria [name] [facility], female   DOB: 1940-06-28, 71 y.o.   MRN: [id number] Maria [name] is seen today for F/U of SVT, palpitations, HTN and elevated lipids. She has no documented CAD.and a negative myovue in 2008. She is having stable palpitaitons. They occur especially at night when she is trying to read. She sometimes has relief with coughing. She denies SOB, diaphoresis or syncope. She has had documented SVT in the past Rx with adenosine. She has been compliant with her meds including BB. Both lopresser and coreg have caused nightmares She is tolerating Atenolol very well. Has been intolerant to vytorin. LDL in  6/13   was 92 Tolerating crestor   BS poorly controlled Did not want to take Januvia for a while due to risk of pancreatic cancer. Had to hold meds for colonoscopy. Insulin started by Dr Para March Allergic to injection vehicle  Seeing Dr Tedd Sias at [facility] this week   ROS: Denies fever, malais, weight loss, blurry vision, decreased visual acuity, cough, sputum, SOB, hemoptysis, pleuritic pain, palpitaitons, heartburn, abdominal pain, melena, lower extremity edema, claudication, or rash.  All other systems reviewed and negative  General: Affect appropriate Healthy:  appears stated age HEENT: normal Neck supple with no adenopathy JVP normal no bruits no thyromegaly Lungs clear with no wheezing and good diaphragmatic motion Heart:  S1/S2 no murmur, no rub, gallop or click PMI normal Abdomen: benighn, BS positve, no tenderness, no AAA no bruit.  No HSM or HJR Distal pulses intact with no bruits No edema Neuro non-focal Skin warm and dry No muscular weakness   Current Outpatient Prescriptions  Medication Sig Dispense Refill  . atenolol (TENORMIN) 50 MG tablet Take 1 tablet (50 mg total) by mouth daily.  30 tablet  1  . celecoxib (CELEBREX) 200 MG capsule Take 1 capsule (200 mg total) by mouth daily as needed for pain.  30 capsule  5  . glyBURIDE (DIABETA) 5 MG tablet Take 2 tablets  (10 mg total) by mouth 2 (two) times daily with a meal.  120 tablet  0  . Insulin Pen Needle 31G X 6 MM MISC Use as directed with lantus solostar pen.      . Insulin Pen Needle 31G X 8 MM MISC Use daily with levemir dose  100 each  3  . losartan (COZAAR) 50 MG tablet Take 1 tablet (50 mg total) by mouth daily.  30 tablet  2  . omeprazole (PRILOSEC) 20 MG capsule Take 20 mg by mouth daily as needed.        . rosuvastatin (CRESTOR) 5 MG tablet Take 1 tablet (5 mg total) by mouth every other day.  30 tablet  5    Allergies  Acetaminophen; Carvedilol; Ezetimibe-simvastatin; Ibuprofen; Insulin glargine; Metformin; Metoprolol tartrate; and Naproxen  Electrocardiogram:  NSR rate 62  LAD low voltage  Assessment and Plan

## 2012-01-03 NOTE — Patient Instructions (Signed)
Your physician wants you to follow-up in: YEAR WITH DR [name]  You will receive a reminder letter in the mail two months in advance. If you don't receive a letter, please call our office to schedule the follow-up appointment.  Your physician recommends that you continue on your current medications as directed. Please refer to the Current Medication list given to you today. 

## 2012-01-03 NOTE — Assessment & Plan Note (Signed)
Resolved no structural heart disease 

## 2012-01-03 NOTE — Assessment & Plan Note (Signed)
Multiple intolerances to oral meds and insulin. F/U with endocrine to find a type of insulin she can tolerate Discussed low carb diet.  Target hemoglobin A1c is 6.5 or less.  Continue current medications.

## 2012-01-03 NOTE — Assessment & Plan Note (Signed)
Well controlled.  Continue current medications and low sodium Dash type diet.    

## 2012-01-03 NOTE — Assessment & Plan Note (Signed)
Cholesterol is at goal.  Continue current dose of statin and diet Rx.  No myalgias or side effects.  F/U  LFT's in 6 months. Lab Results  Component Value Date   LDLCALC 93 11/07/2011             

## 2012-01-31 ENCOUNTER — Other Ambulatory Visit: Payer: Self-pay | Admitting: Gastroenterology

## 2012-01-31 DIAGNOSIS — R634 Abnormal weight loss: Secondary | ICD-10-CM

## 2012-01-31 DIAGNOSIS — R109 Unspecified abdominal pain: Secondary | ICD-10-CM

## 2012-02-01 ENCOUNTER — Encounter: Payer: Self-pay | Admitting: Family Medicine

## 2012-02-01 DIAGNOSIS — K591 Functional diarrhea: Secondary | ICD-10-CM | POA: Insufficient documentation

## 2012-02-05 ENCOUNTER — Ambulatory Visit
Admission: RE | Admit: 2012-02-05 | Discharge: 2012-02-05 | Disposition: A | Discharge status: Home / Self Care | Payer: Medicare Other | Source: Ambulatory Visit | Attending: Gastroenterology | Admitting: Gastroenterology

## 2012-02-05 DIAGNOSIS — R634 Abnormal weight loss: Secondary | ICD-10-CM

## 2012-02-05 DIAGNOSIS — R109 Unspecified abdominal pain: Secondary | ICD-10-CM

## 2012-02-05 MED ORDER — IOHEXOL 300 MG/ML  SOLN
100.0000 mL | Freq: Once | INTRAMUSCULAR | Status: AC | PRN
  Administered 2012-02-05: 100 mL via INTRAVENOUS

## 2012-02-06 ENCOUNTER — Other Ambulatory Visit: Payer: Self-pay | Admitting: Gastroenterology

## 2012-02-06 DIAGNOSIS — R634 Abnormal weight loss: Secondary | ICD-10-CM

## 2012-02-06 DIAGNOSIS — R109 Unspecified abdominal pain: Secondary | ICD-10-CM

## 2012-02-06 DIAGNOSIS — R935 Abnormal findings on diagnostic imaging of other abdominal regions, including retroperitoneum: Secondary | ICD-10-CM

## 2012-02-14 ENCOUNTER — Ambulatory Visit (INDEPENDENT_AMBULATORY_CARE_PROVIDER_SITE_OTHER): Payer: Medicare Other | Admitting: Family Medicine

## 2012-02-14 ENCOUNTER — Encounter: Payer: Self-pay | Admitting: Family Medicine

## 2012-02-14 ENCOUNTER — Other Ambulatory Visit: Payer: Self-pay | Admitting: *Deleted

## 2012-02-14 VITALS — BP 138/78 | HR 72 | Temp 98.5°F | Wt 185.8 lb

## 2012-02-14 DIAGNOSIS — R05 Cough: Secondary | ICD-10-CM | POA: Insufficient documentation

## 2012-02-14 MED ORDER — HYDROCODONE-HOMATROPINE 5-1.5 MG/5ML PO SYRP: 5.0000 mL | ORAL_SOLUTION | Freq: Three times a day (TID) | ORAL | Status: DC | PRN

## 2012-02-14 MED ORDER — GLYBURIDE 5 MG PO TABS: 10.0000 mg | ORAL_TABLET | Freq: Two times a day (BID) | ORAL | Status: DC

## 2012-02-14 MED ORDER — BENZONATATE 200 MG PO CAPS: 200.0000 mg | ORAL_CAPSULE | Freq: Three times a day (TID) | ORAL | Status: AC | PRN

## 2012-02-14 NOTE — Patient Instructions (Signed)
Use the hycodan (it can make you drowsy).  If still coughing, use the tessalon.  This should gradually improve.  Take care.

## 2012-02-14 NOTE — Progress Notes (Signed)
Cough.  Started with ST a few days ago, cough followed soon thereafter.  Sleep is disrupted.  Some post tussive emesis.  Sore from coughing.  Voice is altered, was hoarse yesterday, some better today.  No fevers known, but had some sweats a few nights ago- better in meantime.  Sick contacts at home.  Usually with some scant clear sputum, rarely with yellow tint.    Has f/u with endo next month and sugar has been controlled, is losing weight.  Still with diarrhea, on imodium per GI. Has GI f/u pending.    Meds, vitals, and allergies reviewed.   ROS: See HPI.  Otherwise, noncontributory.  nad ncat Tm wnl Nasal and OP exam wnl Neck supple, no LA, no stridor rrr ctab Dry cough noted No inc in wob

## 2012-02-14 NOTE — Telephone Encounter (Signed)
Received faxed refill request from pharmacy. Refill sent to pharmacy electronically. 

## 2012-02-14 NOTE — Assessment & Plan Note (Signed)
Lungs clear, likely viral.  Suppressive tx reasonable.  Use hycodan prn, sedation caution.  If not improved, then use tessalon.  Nontoxic.  She agrees with plan.

## 2012-02-20 ENCOUNTER — Ambulatory Visit
Admission: RE | Admit: 2012-02-20 | Discharge: 2012-02-20 | Disposition: A | Discharge status: Home / Self Care | Payer: Medicare Other | Source: Ambulatory Visit | Attending: Gastroenterology | Admitting: Gastroenterology

## 2012-02-20 DIAGNOSIS — R634 Abnormal weight loss: Secondary | ICD-10-CM

## 2012-02-20 DIAGNOSIS — R935 Abnormal findings on diagnostic imaging of other abdominal regions, including retroperitoneum: Secondary | ICD-10-CM

## 2012-02-20 DIAGNOSIS — R109 Unspecified abdominal pain: Secondary | ICD-10-CM

## 2012-02-20 MED ORDER — IOHEXOL 300 MG/ML  SOLN
100.0000 mL | Freq: Once | INTRAMUSCULAR | Status: AC | PRN
  Administered 2012-02-20: 100 mL via INTRAVENOUS

## 2012-02-22 ENCOUNTER — Ambulatory Visit (INDEPENDENT_AMBULATORY_CARE_PROVIDER_SITE_OTHER): Payer: Medicare Other

## 2012-02-22 DIAGNOSIS — Z23 Encounter for immunization: Secondary | ICD-10-CM

## 2012-03-18 ENCOUNTER — Other Ambulatory Visit: Payer: Self-pay | Admitting: *Deleted

## 2012-03-18 MED ORDER — LOSARTAN POTASSIUM 50 MG PO TABS: 50.0000 mg | ORAL_TABLET | Freq: Every day | ORAL | Status: DC

## 2012-03-18 NOTE — Telephone Encounter (Signed)
Pt calling about Losartan refill. Rx sent to [facility]. #30 day, 6 refill    Deyja Sochacki CMA

## 2012-04-02 ENCOUNTER — Other Ambulatory Visit: Payer: Self-pay | Admitting: Gastroenterology

## 2012-04-19 ENCOUNTER — Other Ambulatory Visit: Payer: Self-pay | Admitting: Gastroenterology

## 2012-04-19 DIAGNOSIS — R109 Unspecified abdominal pain: Secondary | ICD-10-CM

## 2012-05-07 ENCOUNTER — Other Ambulatory Visit: Payer: Self-pay | Admitting: *Deleted

## 2012-05-07 MED ORDER — GLYBURIDE 5 MG PO TABS: 10.0000 mg | ORAL_TABLET | Freq: Two times a day (BID) | ORAL | Status: DC

## 2012-05-16 ENCOUNTER — Other Ambulatory Visit: Payer: Medicare Other

## 2012-05-21 ENCOUNTER — Encounter: Payer: Self-pay | Admitting: Family Medicine

## 2012-05-28 ENCOUNTER — Other Ambulatory Visit: Payer: Self-pay

## 2012-05-28 MED ORDER — ATENOLOL 50 MG PO TABS: 50.0000 mg | ORAL_TABLET | Freq: Every day | ORAL | Status: DC

## 2012-06-09 ENCOUNTER — Encounter: Payer: Self-pay | Admitting: Family Medicine

## 2012-06-09 DIAGNOSIS — R634 Abnormal weight loss: Secondary | ICD-10-CM | POA: Insufficient documentation

## 2012-07-11 ENCOUNTER — Ambulatory Visit (INDEPENDENT_AMBULATORY_CARE_PROVIDER_SITE_OTHER): Payer: Medicare Other | Admitting: Family Medicine

## 2012-07-11 ENCOUNTER — Encounter: Payer: Self-pay | Admitting: Family Medicine

## 2012-07-11 VITALS — BP 138/72 | HR 64 | Temp 98.5°F | Wt 179.0 lb

## 2012-07-11 DIAGNOSIS — M549 Dorsalgia, unspecified: Secondary | ICD-10-CM

## 2012-07-11 LAB — POCT URINALYSIS DIPSTICK
Blood, UA: NEGATIVE
Ketones, UA: NEGATIVE
Protein, UA: NEGATIVE
Spec Grav, UA: >=1.03
Urobilinogen, UA: NEGATIVE

## 2012-07-11 MED ORDER — CELECOXIB 200 MG PO CAPS: 200.0000 mg | ORAL_CAPSULE | Freq: Every day | ORAL | Status: DC | PRN

## 2012-07-11 MED ORDER — TRAMADOL HCL 50 MG PO TABS: 50.0000 mg | ORAL_TABLET | Freq: Four times a day (QID) | ORAL | Status: DC | PRN

## 2012-07-11 MED ORDER — SULFAMETHOXAZOLE-TRIMETHOPRIM 800-160 MG PO TABS: 1.0000 | ORAL_TABLET | Freq: Two times a day (BID) | ORAL | Status: DC

## 2012-07-11 NOTE — Assessment & Plan Note (Signed)
Longstanding, would increase tramadol up to 4 times a day, local heat and call back as needed. She has no emergent sx and isn't interested in further w/u at this point (and that is reasonable).  She agrees with the plan.

## 2012-07-11 NOTE — Progress Notes (Signed)
She rarely has a day w/o back pain. "It's part of me."  The tramadol usually helps.  She'll have a 'bad' flare with exertion, hard work.  "And sometimes I just have a bad day."  Recently she had a flare of pain starting about 1 week ago.  Worst pain is in mid back and upper back recently.  She has known DDD in lumbar and C spine.  She has been taking celebrex episodically.  She has been off it for about 2 days.  [facility] and this may have caused the flare.  She felt better exercising but then 'paid the price for it'.  Hasn't been using heat recently, but had done so in the past.  No radiation into the legs.   Tramadol dosing is pain dependent and variable. She's never had to take more than 3 in a day.  L sided back pain is the most bothersome for her.  This gets better if she is laying down. No fevers.  Not sob.  No changes in bowel or bladder sx- the diarrhea is improved with imodium. She has no incontinence.    H/o UTIs.  Dysuria, burning for 48 hours.  This is similar.  No fevers.   Meds, vitals, and allergies reviewed.   ROS: See HPI.  Otherwise, noncontributory.  nad ncat rrr ctab Back w/o midline pain, no cva pain.  She has L sided paraspinal muscle tenderness. Sensation wnl for the BLE abd soft, suprapubic area no ttp

## 2012-07-11 NOTE — Assessment & Plan Note (Signed)
Possible UTI, nontoxic.  Would hold abx for now, start if worsening and check ucx in meantime.  She agrees.

## 2012-07-11 NOTE — Patient Instructions (Addendum)
Drink plenty of water and hold the antibiotics for now.  Start the septra if you have more urinary symptoms.  We'll contact you with your lab report.  Take care.

## 2012-07-13 LAB — URINE CULTURE: Colony Count: >=100000

## 2012-07-15 ENCOUNTER — Telehealth: Payer: Self-pay | Admitting: Family Medicine

## 2012-07-15 MED ORDER — CIPROFLOXACIN HCL 250 MG PO TABS: 250.0000 mg | ORAL_TABLET | Freq: Two times a day (BID) | ORAL | Status: DC

## 2012-07-15 NOTE — Telephone Encounter (Signed)
I sent the cipro.  Thanks.

## 2012-07-15 NOTE — Telephone Encounter (Signed)
Message copied by Joaquim Nam on Mon Jul 15, 2012  1:13 PM ------      Message from: Annamarie Major      Created: Mon Jul 15, 2012  9:22 AM       Patient says she is allergic to [facility] and has neglected to tell us prior.  She says the GYN gave it to her once and it made her very sick.  I have now updated the Allergies list.  Patient says at that time, GYN switched her to Cipro.  Please advise. ------

## 2012-07-15 NOTE — Telephone Encounter (Signed)
Patient advised.

## 2012-08-14 ENCOUNTER — Other Ambulatory Visit: Payer: Self-pay

## 2012-08-14 MED ORDER — GLYBURIDE 5 MG PO TABS: 10.0000 mg | ORAL_TABLET | Freq: Two times a day (BID) | ORAL | Status: DC

## 2012-08-14 NOTE — Telephone Encounter (Signed)
[facility] faxed refill glyburide; # 120 with note pt needs to call for appt.

## 2012-09-13 ENCOUNTER — Emergency Department (HOSPITAL_COMMUNITY)
Admission: EM | Admit: 2012-09-13 | Discharge: 2012-09-14 | Disposition: A | Discharge status: Home / Self Care | Payer: Medicare Other | Attending: Emergency Medicine | Admitting: Emergency Medicine

## 2012-09-13 ENCOUNTER — Encounter (HOSPITAL_COMMUNITY): Payer: Self-pay

## 2012-09-13 DIAGNOSIS — E785 Hyperlipidemia, unspecified: Secondary | ICD-10-CM | POA: Insufficient documentation

## 2012-09-13 DIAGNOSIS — I10 Essential (primary) hypertension: Secondary | ICD-10-CM | POA: Insufficient documentation

## 2012-09-13 DIAGNOSIS — Z8781 Personal history of (healed) traumatic fracture: Secondary | ICD-10-CM | POA: Insufficient documentation

## 2012-09-13 DIAGNOSIS — Z8659 Personal history of other mental and behavioral disorders: Secondary | ICD-10-CM | POA: Insufficient documentation

## 2012-09-13 DIAGNOSIS — R002 Palpitations: Secondary | ICD-10-CM | POA: Insufficient documentation

## 2012-09-13 DIAGNOSIS — Z87448 Personal history of other diseases of urinary system: Secondary | ICD-10-CM | POA: Insufficient documentation

## 2012-09-13 DIAGNOSIS — K219 Gastro-esophageal reflux disease without esophagitis: Secondary | ICD-10-CM | POA: Insufficient documentation

## 2012-09-13 DIAGNOSIS — R079 Chest pain, unspecified: Secondary | ICD-10-CM

## 2012-09-13 DIAGNOSIS — Z8719 Personal history of other diseases of the digestive system: Secondary | ICD-10-CM | POA: Insufficient documentation

## 2012-09-13 DIAGNOSIS — Z79899 Other long term (current) drug therapy: Secondary | ICD-10-CM | POA: Insufficient documentation

## 2012-09-13 DIAGNOSIS — Z8739 Personal history of other diseases of the musculoskeletal system and connective tissue: Secondary | ICD-10-CM | POA: Insufficient documentation

## 2012-09-13 DIAGNOSIS — Z862 Personal history of diseases of the blood and blood-forming organs and certain disorders involving the immune mechanism: Secondary | ICD-10-CM | POA: Insufficient documentation

## 2012-09-13 DIAGNOSIS — E119 Type 2 diabetes mellitus without complications: Secondary | ICD-10-CM | POA: Insufficient documentation

## 2012-09-13 DIAGNOSIS — I2510 Atherosclerotic heart disease of native coronary artery without angina pectoris: Secondary | ICD-10-CM | POA: Insufficient documentation

## 2012-09-13 DIAGNOSIS — R072 Precordial pain: Secondary | ICD-10-CM | POA: Insufficient documentation

## 2012-09-13 DIAGNOSIS — Z8639 Personal history of other endocrine, nutritional and metabolic disease: Secondary | ICD-10-CM | POA: Insufficient documentation

## 2012-09-13 NOTE — ED Notes (Signed)
EMS-pt to ed from home.  Pt sts that she had sudden onset of chest pain/ heart racing around 2230 tonight.  Hx of svt.  Feels more like an ache than pain. Also c/o of chronic back pain tonight. Nad.  Had nitrox1 enroute with some relief.  ASA 324 PTA.

## 2012-09-14 ENCOUNTER — Emergency Department (HOSPITAL_COMMUNITY): Payer: Medicare Other

## 2012-09-14 LAB — COMPREHENSIVE METABOLIC PANEL
ALT: 9 U/L (ref 0–35)
Alkaline Phosphatase: 92 U/L (ref 39–117)
BUN: 15 mg/dL (ref 6–23)
CO2: 27 mEq/L (ref 19–32)
GFR calc Af Amer: 64 mL/min — ABNORMAL LOW (ref >90)
GFR calc non Af Amer: 55 mL/min — ABNORMAL LOW (ref >90)
Glucose, Bld: 85 mg/dL (ref 70–99)
Potassium: 3.7 mEq/L (ref 3.5–5.1)
Sodium: 142 mEq/L (ref 135–145)
Total Bilirubin: 0.3 mg/dL (ref 0.3–1.2)
Total Protein: 6.1 g/dL (ref 6.0–8.3)

## 2012-09-14 LAB — CBC WITH DIFFERENTIAL/PLATELET
Eosinophils Absolute: 0.3 10*3/uL (ref 0.0–0.7)
Hemoglobin: 11.7 g/dL — ABNORMAL LOW (ref 12.0–15.0)
Lymphocytes Relative: 35 % (ref 12–46)
Lymphs Abs: 3.2 10*3/uL (ref 0.7–4.0)
MCH: 30.2 pg (ref 26.0–34.0)
MCV: 85.6 fL (ref 78.0–100.0)
Monocytes Relative: 7 % (ref 3–12)
Neutrophils Relative %: 55 % (ref 43–77)
RBC: 3.88 MIL/uL (ref 3.87–5.11)
WBC: 9.2 10*3/uL (ref 4.0–10.5)

## 2012-09-14 LAB — POCT I-STAT TROPONIN I: Troponin i, poc: 0 ng/mL (ref 0.00–0.08)
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
## 2012-09-14 NOTE — ED Notes (Signed)
Pt dc to home. Pt sts understanding to dc instructions. Pt ambulatory to exit without difficulty.  Pt denies need for w/c.  

## 2012-10-10 ENCOUNTER — Ambulatory Visit (INDEPENDENT_AMBULATORY_CARE_PROVIDER_SITE_OTHER): Payer: Medicare Other | Admitting: Cardiovascular Disease

## 2012-10-10 ENCOUNTER — Encounter: Payer: Self-pay | Admitting: Cardiovascular Disease

## 2012-10-10 VITALS — BP 130/70 | HR 64 | Ht 66.0 in | Wt 180.0 lb

## 2012-10-10 DIAGNOSIS — E785 Hyperlipidemia, unspecified: Secondary | ICD-10-CM

## 2012-10-10 DIAGNOSIS — I10 Essential (primary) hypertension: Secondary | ICD-10-CM

## 2012-10-10 DIAGNOSIS — I498 Other specified cardiac arrhythmias: Secondary | ICD-10-CM

## 2012-10-10 DIAGNOSIS — I471 Supraventricular tachycardia: Secondary | ICD-10-CM

## 2012-10-10 DIAGNOSIS — E119 Type 2 diabetes mellitus without complications: Secondary | ICD-10-CM

## 2012-10-10 NOTE — Assessment & Plan Note (Signed)
Well controlled.  Continue current medications and low sodium Dash type diet.    

## 2012-10-10 NOTE — Assessment & Plan Note (Signed)
Cholesterol is at goal.  Continue current dose of statin and diet Rx.  No myalgias or side effects.  F/U  LFT's in 6 months. Lab Results  Component Value Date   LDLCALC 93 11/07/2011             

## 2012-10-10 NOTE — Assessment & Plan Note (Signed)
Recurrent but still infrequent Will refer to EP to get more info on ablation. Continue atenolol

## 2012-10-10 NOTE — Patient Instructions (Addendum)
You have been referred to Dr. Carlus Pavlov, with [facility]. You have been referred to [facility] Electrophysiology to discuss SVT Ablation. Your physician wants you to follow-up in: 6 months with Dr. [name].  You will receive a reminder letter in the mail two months in advance. If you don't receive a letter, please call our office to schedule the follow-up appointment.

## 2012-10-10 NOTE — Progress Notes (Signed)
Patient ID: Maria [name] [facility], female   DOB: 02-02-1941, 72 y.o.   MRN: [id number] Maria [name] is seen today for F/U of SVT, palpitations, HTN and elevated lipids. She has no documented CAD.and a negative myovue in 2008. She is having stable palpitaitons. They occur especially at night when she is trying to read. She sometimes has relief with coughing. She denies SOB, diaphoresis or syncope. She has had documented SVT in the past Rx with adenosine. She has been compliant with her meds including BB. Both lopresser and coreg have caused nightmares She is tolerating Atenolol very well. Has been intolerant to vytorin. LDL in 6/13 was 92 Tolerating crestor   She lost 27 lbs and BS improved Needs an endocrinologist and willing to see Gherege.  Had episode of SVT and went to ER  Reviewed records 5/2 chest pain and palpitatoins R/O ECG NSR  Told to f/u with [name]  ROS: Denies fever, malais, weight loss, blurry vision, decreased visual acuity, cough, sputum, SOB, hemoptysis, pleuritic pain, palpitaitons, heartburn, abdominal pain, melena, lower extremity edema, claudication, or rash.  All other systems reviewed and negative  General: Affect appropriate Healthy:  appears stated age HEENT: normal Neck supple with no adenopathy JVP normal no bruits no thyromegaly Lungs clear with no wheezing and good diaphragmatic motion Heart:  S1/S2 no murmur, no rub, gallop or click PMI normal Abdomen: benighn, BS positve, no tenderness, no AAA no bruit.  No HSM or HJR Distal pulses intact with no bruits No edema Neuro non-focal Skin warm and dry No muscular weakness   Current Outpatient Prescriptions  Medication Sig Dispense Refill  . atenolol (TENORMIN) 50 MG tablet Take 1 tablet (50 mg total) by mouth daily.  30 tablet  3  . celecoxib (CELEBREX) 200 MG capsule Take 1 capsule (200 mg total) by mouth daily as needed for pain.  30 capsule  5  . glyBURIDE (DIABETA) 5 MG tablet Take 2 tablets (10 mg total) by mouth 2 (two)  times daily with a meal.  120 tablet  0  . losartan (COZAAR) 50 MG tablet Take 50 mg by mouth every evening.      [name] omeprazole (PRILOSEC) 20 MG capsule Take 20 mg by mouth daily as needed (for heartburn, acid reflux).       . rosuvastatin (CRESTOR) 5 MG tablet Take 1 tablet (5 mg total) by mouth every other day.  30 tablet  5  . traMADol (ULTRAM) 50 MG tablet Take 1 tablet (50 mg total) by mouth every 6 (six) hours as needed for pain.  100 tablet  5   No current facility-administered medications for this visit.    Allergies  Acetaminophen; Carvedilol; Ezetimibe-simvastatin; Ibuprofen; Insulin glargine; Levemir; Macrobid; Metformin; Metoprolol tartrate; and Naproxen  Electrocardiogram:  5/4  SR LAFB poor R wave progression  Assessment and Plan

## 2012-10-11 ENCOUNTER — Other Ambulatory Visit: Payer: Self-pay | Admitting: *Deleted

## 2012-10-11 MED ORDER — GLYBURIDE 5 MG PO TABS: 10.0000 mg | ORAL_TABLET | Freq: Two times a day (BID) | ORAL | Status: DC

## 2012-10-21 ENCOUNTER — Ambulatory Visit: Payer: Medicare Other | Admitting: Internal Medicine

## 2012-10-29 ENCOUNTER — Ambulatory Visit: Payer: Medicare Other | Admitting: Internal Medicine

## 2012-10-29 DIAGNOSIS — Z0289 Encounter for other administrative examinations: Secondary | ICD-10-CM

## 2012-11-06 ENCOUNTER — Other Ambulatory Visit: Payer: Self-pay | Admitting: *Deleted

## 2012-11-06 MED ORDER — LOSARTAN POTASSIUM 50 MG PO TABS: 50.0000 mg | ORAL_TABLET | Freq: Every evening | ORAL | Status: DC

## 2012-11-11 ENCOUNTER — Ambulatory Visit: Payer: Medicare Other | Admitting: Internal Medicine

## 2012-11-20 ENCOUNTER — Other Ambulatory Visit: Payer: Self-pay | Admitting: *Deleted

## 2012-11-20 MED ORDER — ATENOLOL 50 MG PO TABS: 50.0000 mg | ORAL_TABLET | Freq: Every day | ORAL | Status: DC

## 2012-11-21 ENCOUNTER — Other Ambulatory Visit: Payer: Self-pay

## 2012-12-05 ENCOUNTER — Ambulatory Visit: Payer: Medicare Other | Admitting: Internal Medicine

## 2012-12-16 ENCOUNTER — Ambulatory Visit (INDEPENDENT_AMBULATORY_CARE_PROVIDER_SITE_OTHER): Payer: Medicare Other | Admitting: Family Medicine

## 2012-12-16 ENCOUNTER — Encounter: Payer: Self-pay | Admitting: Family Medicine

## 2012-12-16 VITALS — BP 114/70 | HR 68 | Temp 97.9°F | Wt 174.5 lb

## 2012-12-16 DIAGNOSIS — R209 Unspecified disturbances of skin sensation: Secondary | ICD-10-CM

## 2012-12-16 DIAGNOSIS — R202 Paresthesia of skin: Secondary | ICD-10-CM

## 2012-12-16 DIAGNOSIS — Z639 Problem related to primary support group, unspecified: Secondary | ICD-10-CM | POA: Insufficient documentation

## 2012-12-16 DIAGNOSIS — E119 Type 2 diabetes mellitus without complications: Secondary | ICD-10-CM

## 2012-12-16 NOTE — Patient Instructions (Addendum)
Use the wrist splint as much as possible. That should help.   Let me know about the results of the family meeting.   Schedule a diabetes follow up when you can.  Take care.  I would get a flu shot each fall.

## 2012-12-16 NOTE — Assessment & Plan Note (Signed)
She'll schedule f/u.

## 2012-12-16 NOTE — Assessment & Plan Note (Signed)
Mother with dementia.  Given number for elder services. They'll have family meeting, call elder services and report back to me if I can be of service.  I offered my support.

## 2012-12-16 NOTE — Progress Notes (Signed)
R wrist.  Pain with some movements, has been canning and twisting her hand a lot.  No trauma.  No falls.  R palm with abnormal sensation, some tingling. No weakness.  This is a chronic, nonacute, issue for her.   Mother Rico Ala May) with dementia and delusional behavior.  This has been upsetting to patient.  She is planning on a family meeting today.  They are considering their options.    Meds, vitals, and allergies reviewed.   ROS: See HPI.  Otherwise, noncontributory.  nad ncat Mmm rrr ctab abd soft Normal S/S/DTR in the BUE but tinel pos on R hand.  No weakness in the grip.

## 2012-12-16 NOTE — Assessment & Plan Note (Signed)
Likely R hand overuse and will use a cock up brace at home.  Fu prn.  Anatomy d/w pt.  She agrees.

## 2013-01-17 ENCOUNTER — Other Ambulatory Visit: Payer: Self-pay | Admitting: *Deleted

## 2013-01-17 MED ORDER — GLYBURIDE 5 MG PO TABS: 10.0000 mg | ORAL_TABLET | Freq: Two times a day (BID) | ORAL | Status: DC

## 2013-01-27 ENCOUNTER — Telehealth: Payer: Self-pay

## 2013-01-27 MED ORDER — GLYBURIDE 5 MG PO TABS: 10.0000 mg | ORAL_TABLET | Freq: Two times a day (BID) | ORAL | Status: DC

## 2013-01-27 NOTE — Telephone Encounter (Signed)
Pt is out of Glyburide and rx on 01/17/13 was print and spoke with Carlena Sax at [facility] and did not receive [facility] Patient notified as instructed by telephone and pt will call back for appt.

## 2013-02-17 ENCOUNTER — Other Ambulatory Visit: Payer: Self-pay | Admitting: *Deleted

## 2013-02-17 NOTE — Telephone Encounter (Signed)
Faxed refill request. Please advise.  

## 2013-02-18 MED ORDER — TRAMADOL HCL 50 MG PO TABS: 50.0000 mg | ORAL_TABLET | Freq: Four times a day (QID) | ORAL | Status: DC | PRN

## 2013-02-18 NOTE — Telephone Encounter (Signed)
Medication phoned to pharmacy.  

## 2013-02-18 NOTE — Telephone Encounter (Signed)
Please call in.  Thanks.   

## 2013-03-27 ENCOUNTER — Other Ambulatory Visit: Payer: Self-pay | Admitting: *Deleted

## 2013-03-27 MED ORDER — ATENOLOL 50 MG PO TABS: 50.0000 mg | ORAL_TABLET | Freq: Every day | ORAL | Status: DC

## 2013-05-01 ENCOUNTER — Other Ambulatory Visit: Payer: Self-pay | Admitting: *Deleted

## 2013-05-01 MED ORDER — LOSARTAN POTASSIUM 50 MG PO TABS: 50.0000 mg | ORAL_TABLET | Freq: Every evening | ORAL | Status: DC

## 2013-05-16 ENCOUNTER — Telehealth: Payer: Self-pay | Admitting: *Deleted

## 2013-05-16 NOTE — Telephone Encounter (Signed)
Received prior auth request from insurance company. Auth forms obtained, completed, and faxed to [employer]. Pending notification from insurance.

## 2013-05-28 NOTE — Telephone Encounter (Signed)
Approval form received by insurance company. Will send to provider for signature, then to be scanned into patients medical record. Pharmacy notified via fax.  

## 2013-05-29 ENCOUNTER — Other Ambulatory Visit: Payer: Self-pay

## 2013-05-29 MED ORDER — ATENOLOL 50 MG PO TABS: 50.0000 mg | ORAL_TABLET | Freq: Every day | ORAL | Status: DC

## 2013-05-30 ENCOUNTER — Other Ambulatory Visit: Payer: Self-pay

## 2013-05-30 MED ORDER — LOSARTAN POTASSIUM 50 MG PO TABS: 50.0000 mg | ORAL_TABLET | Freq: Every evening | ORAL | Status: DC

## 2013-06-05 LAB — HM DIABETES EYE EXAM: HM DIABETIC EYE EXAM: NOT DETECTED

## 2013-06-10 ENCOUNTER — Encounter: Payer: Self-pay | Admitting: Family Medicine

## 2013-06-11 ENCOUNTER — Ambulatory Visit (INDEPENDENT_AMBULATORY_CARE_PROVIDER_SITE_OTHER): Payer: Medicare Other | Admitting: Cardiovascular Disease

## 2013-06-11 ENCOUNTER — Ambulatory Visit: Payer: Medicare Other | Admitting: Cardiovascular Disease

## 2013-06-11 ENCOUNTER — Encounter: Payer: Self-pay | Admitting: Cardiovascular Disease

## 2013-06-11 ENCOUNTER — Encounter (INDEPENDENT_AMBULATORY_CARE_PROVIDER_SITE_OTHER): Payer: Self-pay

## 2013-06-11 VITALS — BP 140/70 | HR 64 | Ht 66.0 in | Wt 175.0 lb

## 2013-06-11 DIAGNOSIS — E785 Hyperlipidemia, unspecified: Secondary | ICD-10-CM

## 2013-06-11 DIAGNOSIS — I10 Essential (primary) hypertension: Secondary | ICD-10-CM

## 2013-06-11 DIAGNOSIS — I471 Supraventricular tachycardia: Secondary | ICD-10-CM

## 2013-06-11 NOTE — Assessment & Plan Note (Signed)
Cholesterol is at goal.  Continue current dose of statin and diet Rx.  No myalgias or side effects.  F/U  LFT's in 6 months. Lab Results  Component Value Date   LDLCALC 93 11/07/2011

## 2013-06-11 NOTE — Assessment & Plan Note (Signed)
Well controlled.  Continue current medications and low sodium Dash type diet.    

## 2013-06-11 NOTE — Assessment & Plan Note (Signed)
Stable on atenolol no need for ablation

## 2013-06-11 NOTE — Progress Notes (Signed)
Patient ID: Maria MusselBarbara W [name], female   DOB: June 01, 1940, 73 y.o.   MRN: [id number] Maria MccreedyBarbara is seen today for F/U of SVT, palpitations, HTN and elevated lipids. She has no documented CAD.and a negative myovue in 2008. She is having stable palpitaitons. They occur especially at night when she is trying to read. She sometimes has relief with coughing. She denies SOB, diaphoresis or syncope. She has had documented SVT in the past Rx with adenosine. She has been compliant with her meds including BB. Both lopresser and coreg have caused nightmares She is tolerating Atenolol very well. Has been intolerant to vytorin. LDL in 6/13 was 92 Tolerating crestor  She lost 27 lbs and BS improved Needs an endocrinologist and willing to see G[facility]ge. Had episode of SVT and went to ER Reviewed records  09/13/12   No recurrence  Getting married 08/10/13 Maria [name]( [place])        ROS: Denies fever, malais, weight loss, blurry vision , decreased visual acuity, cough, sputum, SOB, hemoptysis, pleuritic pain, palpitaitons, heartburn, abdominal pain, melena, lower extremity edema, claudication, or rash.  All other systems reviewed and negative  General: Affect appropriate Healthy:  appears stated age HEENT: normal Neck supple with no adenopathy JVP normal no bruits no thyromegaly Lungs clear with no wheezing and good diaphragmatic motion Heart:  S1/S2 no murmur, no rub, gallop or click PMI normal Abdomen: benighn, BS positve, no tenderness, no AAA no bruit.  No HSM or HJR Distal pulses intact with no bruits No edema Neuro non-focal Skin warm and dry No muscular weakness   Current Outpatient Prescriptions  Medication Sig Dispense Refill  . atenolol (TENORMIN) 50 MG tablet Take 1 tablet (50 mg total) by mouth daily.  30 tablet  1  . celecoxib (CELEBREX) 200 MG capsule Take 1 capsule (200 mg total) by mouth daily as needed for pain.  30 capsule  5  . glyBURIDE (DIABETA) 5 MG tablet Take 2 tablets (10 mg total) by mouth 2 (two)  times daily with a meal. *Needs to reschedule new patient appointment with Dr Elvera LennoxGherghe, or see Dr Para Marchuncan for additional refills.  120 tablet  0  . losartan (COZAAR) 50 MG tablet Take 1 tablet (50 mg total) by mouth every evening.  30 tablet  0  . omeprazole (PRILOSEC) 20 MG capsule Take 20 mg by mouth daily as needed (for heartburn, acid reflux).       . rosuvastatin (CRESTOR) 5 MG tablet Take 1 tablet (5 mg total) by mouth every other day.  30 tablet  5  . traMADol (ULTRAM) 50 MG tablet Take 1 tablet (50 mg total) by mouth every 6 (six) hours as needed for pain.  100 tablet  5   No current facility-administered medications for this visit.    Allergies  Acetaminophen; Carvedilol; Ezetimibe-simvastatin; Ibuprofen; Insulin glargine; Levemir; Macrobid; Metformin; Metoprolol tartrate; and Naproxen  Electrocardiogram:  Assessment and Plan

## 2013-06-11 NOTE — Patient Instructions (Signed)
Your physician wants you to follow-up in: YEAR WITH DR [name]  You will receive a reminder letter in the mail two months in advance. If you don't receive a letter, please call our office to schedule the follow-up appointment.  Your physician recommends that you continue on your current medications as directed. Please refer to the Current Medication list given to you today. 

## 2013-06-24 ENCOUNTER — Ambulatory Visit (INDEPENDENT_AMBULATORY_CARE_PROVIDER_SITE_OTHER): Payer: Medicare Other | Admitting: Internal Medicine

## 2013-06-24 ENCOUNTER — Encounter: Payer: Self-pay | Admitting: Internal Medicine

## 2013-06-24 VITALS — BP 118/78 | HR 63 | Temp 97.7°F | Wt 173.8 lb

## 2013-06-24 DIAGNOSIS — R509 Fever, unspecified: Secondary | ICD-10-CM

## 2013-06-24 DIAGNOSIS — N390 Urinary tract infection, site not specified: Secondary | ICD-10-CM

## 2013-06-24 DIAGNOSIS — R300 Dysuria: Secondary | ICD-10-CM

## 2013-06-24 MED ORDER — CIPROFLOXACIN HCL 500 MG PO TABS: 500.0000 mg | ORAL_TABLET | Freq: Two times a day (BID) | ORAL | Status: DC

## 2013-06-24 NOTE — Progress Notes (Signed)
Pre-visit discussion using our clinic review tool. No additional management support is needed unless otherwise documented below in the visit note.  
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
## 2013-06-24 NOTE — Patient Instructions (Signed)
Urinary Tract Infection  Urinary tract infections (UTIs) can develop anywhere along your urinary tract. Your urinary tract is your body's drainage system for removing wastes and extra water. Your urinary tract includes two kidneys, two ureters, a bladder, and a urethra. Your kidneys are a pair of bean-shaped organs. Each kidney is about the size of your fist. They are located below your ribs, one on each side of your spine.  CAUSES  Infections are caused by microbes, which are microscopic organisms, including fungi, viruses, and bacteria. These organisms are so small that they can only be seen through a microscope. Bacteria are the microbes that most commonly cause UTIs.  SYMPTOMS   Symptoms of UTIs may vary by age and gender of the patient and by the location of the infection. Symptoms in young women typically include a frequent and intense urge to urinate and a painful, burning feeling in the bladder or urethra during urination. Older women and men are more likely to be tired, shaky, and weak and have muscle aches and abdominal pain. A fever may mean the infection is in your kidneys. Other symptoms of a kidney infection include pain in your back or sides below the ribs, nausea, and vomiting.  DIAGNOSIS  To diagnose a UTI, your caregiver will ask you about your symptoms. Your caregiver also will ask to provide a urine sample. The urine sample will be tested for bacteria and white blood cells. White blood cells are made by your body to help fight infection.  TREATMENT   Typically, UTIs can be treated with medication. Because most UTIs are caused by a bacterial infection, they usually can be treated with the use of antibiotics. The choice of antibiotic and length of treatment depend on your symptoms and the type of bacteria causing your infection.  HOME CARE INSTRUCTIONS   If you were prescribed antibiotics, take them exactly as your caregiver instructs you. Finish the medication even if you feel better after you  have only taken some of the medication.   Drink enough water and fluids to keep your urine clear or pale yellow.   Avoid caffeine, tea, and carbonated beverages. They tend to irritate your bladder.   Empty your bladder often. Avoid holding urine for long periods of time.   Empty your bladder before and after sexual intercourse.   After a bowel movement, women should cleanse from front to back. Use each tissue only once.  SEEK MEDICAL CARE IF:    You have back pain.   You develop a fever.   Your symptoms do not begin to resolve within 3 days.  SEEK IMMEDIATE MEDICAL CARE IF:    You have severe back pain or lower abdominal pain.   You develop chills.   You have nausea or vomiting.   You have continued burning or discomfort with urination.  MAKE SURE YOU:    Understand these instructions.   Will watch your condition.   Will get help right away if you are not doing well or get worse.  Document Released: 02/08/2005 Document Revised: 10/31/2011 Document Reviewed: 06/09/2011  [employer] Patient Information 2014 [employer].
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
## 2013-07-14 ENCOUNTER — Other Ambulatory Visit: Payer: Self-pay

## 2013-07-14 MED ORDER — LOSARTAN POTASSIUM 50 MG PO TABS
50.0000 mg | ORAL_TABLET | Freq: Every evening | ORAL | Status: DC
Start: 2013-07-14 — End: 2013-08-28

## 2013-07-31 ENCOUNTER — Encounter: Payer: Self-pay | Admitting: Family Medicine

## 2013-07-31 ENCOUNTER — Other Ambulatory Visit: Payer: Self-pay | Admitting: *Deleted

## 2013-07-31 ENCOUNTER — Ambulatory Visit (INDEPENDENT_AMBULATORY_CARE_PROVIDER_SITE_OTHER): Payer: Medicare Other | Admitting: Family Medicine

## 2013-07-31 VITALS — BP 124/72 | HR 65 | Temp 98.2°F | Wt 175.5 lb

## 2013-07-31 DIAGNOSIS — N390 Urinary tract infection, site not specified: Secondary | ICD-10-CM

## 2013-07-31 DIAGNOSIS — R209 Unspecified disturbances of skin sensation: Secondary | ICD-10-CM

## 2013-07-31 DIAGNOSIS — M5030 Other cervical disc degeneration, unspecified cervical region: Secondary | ICD-10-CM

## 2013-07-31 DIAGNOSIS — R300 Dysuria: Secondary | ICD-10-CM

## 2013-07-31 DIAGNOSIS — R202 Paresthesia of skin: Secondary | ICD-10-CM

## 2013-07-31 DIAGNOSIS — E042 Nontoxic multinodular goiter: Secondary | ICD-10-CM

## 2013-07-31 DIAGNOSIS — E119 Type 2 diabetes mellitus without complications: Secondary | ICD-10-CM

## 2013-07-31 LAB — POCT URINALYSIS DIPSTICK
Bilirubin, UA: NEGATIVE
Blood, UA: NEGATIVE
GLUCOSE UA: NEGATIVE
Ketones, UA: NEGATIVE
Leukocytes, UA: NEGATIVE
Nitrite, UA: NEGATIVE
Protein, UA: NEGATIVE
Spec Grav, UA: 1.02
UROBILINOGEN UA: NEGATIVE
pH, UA: 6

## 2013-07-31 LAB — COMPREHENSIVE METABOLIC PANEL
ALT: 15 U/L (ref 0–35)
AST: 17 U/L (ref 0–37)
Albumin: 4.3 g/dL (ref 3.5–5.2)
Alkaline Phosphatase: 80 U/L (ref 39–117)
BILIRUBIN TOTAL: 0.7 mg/dL (ref 0.3–1.2)
BUN: 18 mg/dL (ref 6–23)
CALCIUM: 9.3 mg/dL (ref 8.4–10.5)
CHLORIDE: 105 meq/L (ref 96–112)
CO2: 24 meq/L (ref 19–32)
CREATININE: 1 mg/dL (ref 0.4–1.2)
GFR: 60.39 mL/min (ref >60.00)
GLUCOSE: 142 mg/dL — AB (ref 70–99)
Potassium: 4.3 mEq/L (ref 3.5–5.1)
Sodium: 140 mEq/L (ref 135–145)
Total Protein: 7.1 g/dL (ref 6.0–8.3)

## 2013-07-31 LAB — LIPID PANEL
CHOLESTEROL: 211 mg/dL — AB (ref 0–200)
HDL: 42.8 mg/dL (ref >39.00)
LDL Cholesterol: 146 mg/dL — ABNORMAL HIGH (ref 0–99)
Total CHOL/HDL Ratio: 5
Triglycerides: 113 mg/dL (ref 0.0–149.0)
VLDL: 22.6 mg/dL (ref 0.0–40.0)

## 2013-07-31 LAB — TSH: TSH: 1.76 u[IU]/mL (ref 0.35–5.50)

## 2013-07-31 LAB — HEMOGLOBIN A1C: HEMOGLOBIN A1C: 6.7 % — AB (ref 4.6–6.5)

## 2013-07-31 MED ORDER — CIPROFLOXACIN HCL 250 MG PO TABS: 250.0000 mg | ORAL_TABLET | Freq: Two times a day (BID) | ORAL | Status: DC

## 2013-07-31 MED ORDER — CELECOXIB 200 MG PO CAPS: 200.0000 mg | ORAL_CAPSULE | Freq: Every day | ORAL | Status: DC | PRN

## 2013-07-31 MED ORDER — TRAMADOL HCL 50 MG PO TABS: 50.0000 mg | ORAL_TABLET | Freq: Four times a day (QID) | ORAL | Status: DC | PRN

## 2013-07-31 MED ORDER — ATENOLOL 50 MG PO TABS: 50.0000 mg | ORAL_TABLET | Freq: Every day | ORAL | Status: DC

## 2013-07-31 NOTE — Patient Instructions (Addendum)
Go to the lab on the way out.  We'll contact you with your lab report. Hold the cipro for now.  Start it if you have more symptoms.  Call back about setting up the thyroid ultrasound.  Take care.  Glad to see you.

## 2013-07-31 NOTE — Progress Notes (Signed)
Pre visit review using our clinic review tool, if applicable. No additional management support is needed unless otherwise documented below in the visit note.  She's getting married again in about 10 days.  I congratulated her.  She doesn't know where she is going on the honeymoon and wanted to take an rx for cipro in case she had a UTI.    Diabetes:  Using medications without difficulties: off DM2 meds recently, for about 3-4 weeks.  Hypoglycemic episodes: no Hyperglycemic episodes: occ Feet problems:no Blood Sugars averaging: ~150 in AM now, off meds eye exam within last year:yes  Dysuria: some recently.  Not today.  duration of symptoms: a few days abdominal pain:no fevers:no back pain:no Vomiting:no  H/o thyroid nodules, had been hoarse recently.  Wanted to defer u/s for now due to pending honeymoon. She'll call back to schedule.    Needed refills on celebrex and tramadol.  Used prn for neck pain w/o ADE.    PMH and SH reviewed  Meds, vitals, and allergies reviewed.   ROS: See HPI.  Otherwise negative.    GEN: nad, alert and oriented HEENT: mucous membranes moist NECK: supple, no TMG CV: rrr.  PULM: ctab, no inc wob ABD: soft, +bs, suprapubic area not tender EXT: no edema SKIN: no acute rash BACK: no CVA pain  Diabetic foot exam: Normal inspection No skin breakdown No calluses  Normal DP pulses Normal sensation to light touch and monofilament Nails normal

## 2013-08-01 ENCOUNTER — Other Ambulatory Visit: Payer: Self-pay | Admitting: Family Medicine

## 2013-08-01 DIAGNOSIS — N390 Urinary tract infection, site not specified: Secondary | ICD-10-CM | POA: Insufficient documentation

## 2013-08-01 DIAGNOSIS — E119 Type 2 diabetes mellitus without complications: Secondary | ICD-10-CM

## 2013-08-01 LAB — URINE CULTURE
Colony Count: NO GROWTH
Organism ID, Bacteria: NO GROWTH

## 2013-08-01 NOTE — Assessment & Plan Note (Signed)
TSH wnl, she'll call about to schedule an u/s.

## 2013-08-01 NOTE — Assessment & Plan Note (Signed)
Controlled, would stay off meds for now.  If sugar elevated, we can restart glimepiride.  Recheck A1c in about 3 months.  Will have patient update me if needed in meantime.

## 2013-08-01 NOTE — Assessment & Plan Note (Signed)
Continue as is with prn meds when she has a flare.

## 2013-08-01 NOTE — Assessment & Plan Note (Signed)
Hold cipro for now. Start it if she has more symptoms.  Check ucx.  She agrees.

## 2013-08-01 NOTE — Assessment & Plan Note (Signed)
D/w pt.  Resolved.

## 2013-08-08 ENCOUNTER — Telehealth: Payer: Self-pay

## 2013-08-08 NOTE — Telephone Encounter (Signed)
Relevant patient education mailed to patient.  

## 2013-08-28 ENCOUNTER — Other Ambulatory Visit: Payer: Self-pay

## 2013-08-28 MED ORDER — LOSARTAN POTASSIUM 50 MG PO TABS: 50.0000 mg | ORAL_TABLET | Freq: Every evening | ORAL | Status: DC

## 2013-09-23 ENCOUNTER — Ambulatory Visit (INDEPENDENT_AMBULATORY_CARE_PROVIDER_SITE_OTHER): Payer: Medicare Other | Admitting: Family Medicine

## 2013-09-23 ENCOUNTER — Encounter: Payer: Self-pay | Admitting: Family Medicine

## 2013-09-23 VITALS — BP 112/68 | HR 52 | Temp 97.8°F | Wt 176.0 lb

## 2013-09-23 DIAGNOSIS — M5030 Other cervical disc degeneration, unspecified cervical region: Secondary | ICD-10-CM

## 2013-09-23 DIAGNOSIS — E049 Nontoxic goiter, unspecified: Secondary | ICD-10-CM

## 2013-09-23 DIAGNOSIS — E042 Nontoxic multinodular goiter: Secondary | ICD-10-CM

## 2013-09-23 DIAGNOSIS — E119 Type 2 diabetes mellitus without complications: Secondary | ICD-10-CM

## 2013-09-23 MED ORDER — GLYBURIDE 5 MG PO TABS: 5.0000 mg | ORAL_TABLET | Freq: Every day | ORAL | Status: DC

## 2013-09-23 NOTE — Patient Instructions (Addendum)
Shirlee LimerickMarion will call about your referral. Keep using the tramadol and celebrex in the meantime.  Glad to see you.

## 2013-09-23 NOTE — Progress Notes (Signed)
Pre visit review using our clinic review tool, if applicable. No additional management support is needed unless otherwise documented below in the visit note.  H/o neck pain prev.  This is similar to prev, but this flare not resolved yet.  Usually resolved in the past with tramadol and celebrex.  L>R, posterior>anterior.  Some better today, after using celebrex for about 5 days.  Variable pain level.  At the base of the skull, on L side.  No arm pain, no new weakness in her arms.    Sugar has been around 120 in the AMs.    No new dysphagia but she has been hoarse and is due for f/u thyroid u/s.  Discussed.  No anterior pain.  No new lumps per patient.  TSH prev wnl  Meds, vitals, and allergies reviewed.   ROS: See HPI.  Otherwise, noncontributory.  nad ncat Mmm Neck supple, no LA, no TMG No posterior neck midline pain but TTP on the L side of the posterior neck, ext at L occipital ridge.   rrr ctab Gait wnl S/s wnl x4 cn2-12 wnl

## 2013-09-24 NOTE — Assessment & Plan Note (Signed)
Looks to be another flare, some better today.  Continue as is and is should improve.   She agrees.  No need for imaging or other intervention now.

## 2013-09-24 NOTE — Assessment & Plan Note (Signed)
Repeat u/s ordered.

## 2013-09-24 NOTE — Assessment & Plan Note (Signed)
We can recheck A1c later in the summer.  She agrees.

## 2013-10-01 ENCOUNTER — Ambulatory Visit
Admission: RE | Admit: 2013-10-01 | Discharge: 2013-10-01 | Disposition: A | Discharge status: Home / Self Care | Payer: Medicare Other | Source: Ambulatory Visit | Attending: Family Medicine | Admitting: Family Medicine

## 2013-10-01 ENCOUNTER — Other Ambulatory Visit: Payer: Self-pay | Admitting: Family Medicine

## 2013-10-01 DIAGNOSIS — R490 Dysphonia: Secondary | ICD-10-CM

## 2013-10-01 DIAGNOSIS — E049 Nontoxic goiter, unspecified: Secondary | ICD-10-CM

## 2013-11-10 ENCOUNTER — Telehealth: Payer: Self-pay

## 2013-11-10 DIAGNOSIS — E119 Type 2 diabetes mellitus without complications: Secondary | ICD-10-CM

## 2013-11-10 MED ORDER — GLYBURIDE 5 MG PO TABS: 5.0000 mg | ORAL_TABLET | Freq: Every day | ORAL | Status: DC

## 2013-11-10 NOTE — Telephone Encounter (Signed)
Pt made 3 month f/u appt and 1 mth supply of medication has been sent to pharmacy

## 2013-12-04 ENCOUNTER — Ambulatory Visit: Payer: Medicare Other | Admitting: Family Medicine

## 2013-12-13 LAB — HM MAMMOGRAPHY

## 2013-12-18 ENCOUNTER — Encounter: Payer: Self-pay | Admitting: Family Medicine

## 2013-12-18 ENCOUNTER — Ambulatory Visit (INDEPENDENT_AMBULATORY_CARE_PROVIDER_SITE_OTHER): Payer: Medicare Other | Admitting: Family Medicine

## 2013-12-18 VITALS — BP 124/74 | HR 70 | Temp 97.9°F | Wt 182.5 lb

## 2013-12-18 DIAGNOSIS — E119 Type 2 diabetes mellitus without complications: Secondary | ICD-10-CM

## 2013-12-18 DIAGNOSIS — Z23 Encounter for immunization: Secondary | ICD-10-CM

## 2013-12-18 DIAGNOSIS — E785 Hyperlipidemia, unspecified: Secondary | ICD-10-CM

## 2013-12-18 LAB — HEMOGLOBIN A1C: Hgb A1c MFr Bld: 6.8 % — ABNORMAL HIGH (ref 4.6–6.5)

## 2013-12-18 LAB — LIPID PANEL
CHOL/HDL RATIO: 5
Cholesterol: 183 mg/dL (ref 0–200)
HDL: 39.9 mg/dL (ref >39.00)
LDL CALC: 130 mg/dL — AB (ref 0–99)
NONHDL: 143.1
TRIGLYCERIDES: 64 mg/dL (ref 0.0–149.0)
VLDL: 12.8 mg/dL (ref 0.0–40.0)

## 2013-12-18 MED ORDER — GLYBURIDE 5 MG PO TABS: ORAL_TABLET | ORAL | Status: DC

## 2013-12-18 NOTE — Patient Instructions (Signed)
Go to the lab on the way out.  We'll contact you with your lab report. We may need to try the MWF dosing of crestor.   Call about your mammogram.   Take care.  Glad to see you.

## 2013-12-18 NOTE — Progress Notes (Signed)
Pre visit review using our clinic review tool, if applicable. No additional management support is needed unless otherwise documented below in the visit note.  Married, doing well. Name change noted.   She feels awful on a statin, has tried mult statins.  We talked about possible MWF dosing of crestor.  Due for labs.    Diabetes:  Using medications without difficulties:yes Hypoglycemic episodes:no Hyperglycemic episodes:no Feet problems:no Blood Sugars averaging: up to 200 with diet indiscretions.  She is back working on diet.  "I saw my weight was up.  Sugar had been controlled with glyburide 1 tab in AM and 1/2 tab in PM, as long as she was on her diet.  eye exam within last year:yes.  Due for labs.  PNA shot due.  D/w pt.    Meds, vitals, and allergies reviewed.   ROS: See HPI.  Otherwise negative.    GEN: nad, alert and oriented HEENT: mucous membranes moist NECK: supple w/o LA CV: rrr. PULM: ctab, no inc wob ABD: soft, +bs EXT: no edema SKIN: no acute rash  Diabetic foot exam: Normal inspection No skin breakdown No calluses  Normal DP pulses Normal sensation to light touch and monofilament Nails normal

## 2013-12-19 ENCOUNTER — Encounter: Payer: Self-pay | Admitting: *Deleted

## 2013-12-19 MED ORDER — ROSUVASTATIN CALCIUM 5 MG PO TABS: ORAL_TABLET | ORAL | Status: DC

## 2013-12-19 NOTE — Assessment & Plan Note (Signed)
Would try MWF statin dose, see notes on labs.

## 2013-12-19 NOTE — Assessment & Plan Note (Signed)
Dietary changes noted, she is back on diet. No change in meds at this point, see notes on labs.  She agrees.

## 2014-02-09 ENCOUNTER — Other Ambulatory Visit: Payer: Self-pay | Admitting: Cardiovascular Disease

## 2014-03-23 NOTE — Progress Notes (Signed)
Patient ID: Maria ManuelBarbara D [name], female   DOB: 1941-03-22, 73 y.o.   MRN: [id number] Maria MccreedyBarbara is seen today for F/U of SVT, palpitations, HTN and elevated lipids. She has no documented CAD.and a negative myovue in 2008. She is having stable palpitaitons. They occur especially at night when she is trying to read. She sometimes has relief with coughing. She denies SOB, diaphoresis or syncope. She has had documented SVT in the past Rx with adenosine. She has been compliant with her meds including BB. Both lopresser and coreg have caused nightmares She is tolerating Atenolol very well. Has been intolerant to vytorin. LDL in 6/13 was 92 Tolerating crestor  She lost 27 lbs and BS improved Needs an endocrinologist and willing to see [facility]. Had episode of SVT and went to ER Reviewed records  09/13/12 No recurrence Got  married 08/10/13 Maria [name]( Maria [name])  She has had some anxiety/nightmares  Not associated with palpitations    ROS: Denies fever, malais, weight loss, blurry vision, decreased visual acuity, cough, sputum, SOB, hemoptysis, pleuritic pain, palpitaitons, heartburn, abdominal pain, melena, lower extremity edema, claudication, or rash.  All other systems reviewed and negative  General: Affect appropriate Healthy:  appears stated age HEENT: normal Neck supple with no adenopathy JVP normal no bruits no thyromegaly Lungs clear with no wheezing and good diaphragmatic motion Heart:  S1/S2 no murmur, no rub, gallop or click PMI normal Abdomen: benighn, BS positve, no tenderness, no AAA no bruit.  No HSM or HJR Distal pulses intact with no bruits No edema Neuro non-focal Skin warm and dry No muscular weakness   Current Outpatient Prescriptions  Medication Sig Dispense Refill  . atenolol (TENORMIN) 50 MG tablet TAKE 1 TABLET BY MOUTH DAILY. 30 tablet 1  . celecoxib (CELEBREX) 200 MG capsule Take 1 capsule (200 mg total) by mouth daily as needed. 30 capsule 5  . glyBURIDE (DIABETA) 5 MG tablet 1 tab in  AM and 0.5-1 tab in PM 180 tablet 3  . losartan (COZAAR) 50 MG tablet Take 1 tablet (50 mg total) by mouth every evening. 30 tablet 3  . omeprazole (PRILOSEC) 20 MG capsule Take 20 mg by mouth daily as needed (for heartburn, acid reflux).     . rosuvastatin (CRESTOR) 5 MG tablet Take 1 tab MWF    . traMADol (ULTRAM) 50 MG tablet Take 1 tablet (50 mg total) by mouth every 6 (six) hours as needed. 100 tablet 5   No current facility-administered medications for this visit.    Allergies  Acetaminophen; Carvedilol; Crestor; Ezetimibe-simvastatin; Ibuprofen; Insulin glargine; Levemir; Macrobid; Metformin; Metoprolol tartrate; and Naproxen  Electrocardiogram:  5/14  SR normal ST segments LAFB  Today NSR rate 71  LAD no sig change   Assessment and Plan

## 2014-03-24 ENCOUNTER — Encounter: Payer: Self-pay | Admitting: Cardiovascular Disease

## 2014-03-24 ENCOUNTER — Ambulatory Visit (INDEPENDENT_AMBULATORY_CARE_PROVIDER_SITE_OTHER): Payer: Medicare Other | Admitting: Cardiovascular Disease

## 2014-03-24 VITALS — BP 120/70 | HR 71 | Ht 66.0 in | Wt 180.1 lb

## 2014-03-24 DIAGNOSIS — E785 Hyperlipidemia, unspecified: Secondary | ICD-10-CM

## 2014-03-24 DIAGNOSIS — I471 Supraventricular tachycardia, unspecified: Secondary | ICD-10-CM

## 2014-03-24 DIAGNOSIS — I10 Essential (primary) hypertension: Secondary | ICD-10-CM

## 2014-03-24 MED ORDER — ATENOLOL 50 MG PO TABS: 50.0000 mg | ORAL_TABLET | Freq: Every day | ORAL | Status: DC

## 2014-03-24 MED ORDER — LOSARTAN POTASSIUM 50 MG PO TABS: 50.0000 mg | ORAL_TABLET | Freq: Every evening | ORAL | Status: DC

## 2014-03-24 NOTE — Assessment & Plan Note (Signed)
Cholesterol is at goal.  Continue current dose of statin and diet Rx.  No myalgias or side effects.  F/U  LFT's in 6 months. Lab Results  Component Value Date   LDLCALC 130* 12/18/2013

## 2014-03-24 NOTE — Patient Instructions (Signed)
Your physician wants you to follow-up in:  6 MONTHS WITH DR [name]  You will receive a reminder letter in the mail two months in advance. If you don't receive a letter, please call our office to schedule the follow-up appointment. Your physician recommends that you continue on your current medications as directed. Please refer to the Current Medication list given to you today. 

## 2014-03-24 NOTE — Assessment & Plan Note (Signed)
Well controlled.  Continue current medications and low sodium Dash type diet.    

## 2014-03-24 NOTE — Assessment & Plan Note (Signed)
STable continue beta blocker  Anxiety at night not associated with rapid pulse

## 2014-04-27 ENCOUNTER — Encounter: Payer: Self-pay | Admitting: Internal Medicine

## 2014-04-27 ENCOUNTER — Ambulatory Visit (INDEPENDENT_AMBULATORY_CARE_PROVIDER_SITE_OTHER): Payer: Medicare Other | Admitting: Internal Medicine

## 2014-04-27 VITALS — BP 118/76 | HR 78 | Temp 97.7°F | Wt 178.5 lb

## 2014-04-27 DIAGNOSIS — R300 Dysuria: Secondary | ICD-10-CM

## 2014-04-27 LAB — POCT URINALYSIS DIPSTICK
Bilirubin, UA: NEGATIVE
Blood, UA: NEGATIVE
Glucose, UA: NEGATIVE
Ketones, UA: NEGATIVE
NITRITE UA: NEGATIVE
PROTEIN UA: NEGATIVE
SPEC GRAV UA: 1.015
UROBILINOGEN UA: NEGATIVE
pH, UA: 6

## 2014-04-27 MED ORDER — CIPROFLOXACIN HCL 250 MG PO TABS: 250.0000 mg | ORAL_TABLET | Freq: Two times a day (BID) | ORAL | Status: DC

## 2014-04-27 NOTE — Patient Instructions (Signed)
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
## 2014-04-27 NOTE — Addendum Note (Signed)
Addended by: Roena MaladyEVONTENNO, Said Rueb Y on: 04/27/2014 01:33 PM   Modules accepted: Orders

## 2014-04-27 NOTE — Progress Notes (Signed)
Pre visit review using our clinic review tool, if applicable. No additional management support is needed unless otherwise documented below in the visit note. 

## 2014-04-30 LAB — URINE CULTURE: Colony Count: >=100000

## 2014-05-12 ENCOUNTER — Telehealth: Payer: Self-pay | Admitting: Cardiovascular Disease

## 2014-05-12 NOTE — Telephone Encounter (Signed)
New Msg       Amber returning call for Dr. Denyce RobertBuebel, he wants to make sure correct fax was received, states he spoke with Wynona Caneshristine earlier.   Correct Fax is [phone].

## 2014-05-12 NOTE — Telephone Encounter (Signed)
SPOKE  WITH AMBER   INFO  RECEIVED  AS  REQUESTED .Zack Seal/CY

## 2014-05-20 ENCOUNTER — Encounter: Payer: Self-pay | Admitting: Family Medicine

## 2014-05-20 ENCOUNTER — Ambulatory Visit (INDEPENDENT_AMBULATORY_CARE_PROVIDER_SITE_OTHER): Payer: Medicare Other | Admitting: Family Medicine

## 2014-05-20 VITALS — BP 130/70 | HR 64 | Temp 98.9°F | Wt 184.6 lb

## 2014-05-20 DIAGNOSIS — H6983 Other specified disorders of Eustachian tube, bilateral: Secondary | ICD-10-CM

## 2014-05-20 DIAGNOSIS — H8110 Benign paroxysmal vertigo, unspecified ear: Secondary | ICD-10-CM

## 2014-05-20 MED ORDER — FLUTICASONE PROPIONATE 50 MCG/ACT NA SUSP: 2.0000 | Freq: Every day | NASAL | Status: DC

## 2014-05-20 MED ORDER — TRAMADOL HCL 50 MG PO TABS: 50.0000 mg | ORAL_TABLET | Freq: Four times a day (QID) | ORAL | Status: DC | PRN

## 2014-05-20 NOTE — Progress Notes (Signed)
Pre visit review using our clinic review tool, if applicable. No additional management support is needed unless otherwise documented below in the visit note.  Last week she thought "I had the ocean" in my ears. Progressive B hearing loss recently.  She has been dizzy recently.  No syncope.  No CP.   No FCNAV.  No rhinorrhea, no ST.  No cough.  She had an episode of tachycardia recently and has used extra atenolol.    She needed a refill on her tramadol for her neck pain.  Meds, vitals, and allergies reviewed.   ROS: See HPI.  Otherwise, noncontributory.  nad ncat TM wnl but B ETD noted on testing Nasal exam and OP exam wnl Neck supple, no LA rrr ctab abd soft DHP positive.  No sx with sitting up when not rotating her head. Weber and Rinne testing wnl.

## 2014-05-20 NOTE — Patient Instructions (Signed)
Use the bedside exercises for the vertigo.  Use flonase and gently try to pop your ears.  If not better, then update me.  Take care.  Glad to see you.

## 2014-05-21 DIAGNOSIS — H6990 Unspecified Eustachian tube disorder, unspecified ear: Secondary | ICD-10-CM | POA: Insufficient documentation

## 2014-05-21 DIAGNOSIS — H6980 Other specified disorders of Eustachian tube, unspecified ear: Secondary | ICD-10-CM | POA: Insufficient documentation

## 2014-05-21 DIAGNOSIS — H8110 Benign paroxysmal vertigo, unspecified ear: Secondary | ICD-10-CM | POA: Insufficient documentation

## 2014-05-21 NOTE — Assessment & Plan Note (Addendum)
Demonstrated on exam B.  D/w pt.  Start flonase, will sugar cautions. Gently valsalva and notify me if not improved.  I didn't start prednisone with her h/o DM2.  This doesn't look to be a primary hearing loss since air>bone conduction B, with weber equal B.  D/w pt.

## 2014-05-21 NOTE — Assessment & Plan Note (Signed)
Reproduced on exam but not orthostatic by exam, ie no sx when getting up and not turning head.  D/w pt.  Use bedside exercises in meantime and should resolve.  Appears to be incidental and not related to the B ETD noted.

## 2014-05-25 ENCOUNTER — Telehealth: Payer: Self-pay | Admitting: Family Medicine

## 2014-05-25 DIAGNOSIS — R42 Dizziness and giddiness: Secondary | ICD-10-CM

## 2014-05-25 NOTE — Telephone Encounter (Signed)
Pt is continuing to have balance problems and she still cannot hear in her ears.  Would like an urgent referral to an ENT as she is concerned about falls.  [facility]  Best number to call pt is [phone] / lt

## 2014-05-25 NOTE — Telephone Encounter (Signed)
Ordered. Thanks

## 2014-06-01 ENCOUNTER — Ambulatory Visit (INDEPENDENT_AMBULATORY_CARE_PROVIDER_SITE_OTHER): Payer: Medicare Other | Admitting: Internal Medicine

## 2014-06-01 ENCOUNTER — Encounter: Payer: Self-pay | Admitting: Internal Medicine

## 2014-06-01 VITALS — BP 126/82 | HR 57 | Ht 66.0 in | Wt 178.4 lb

## 2014-06-01 DIAGNOSIS — E785 Hyperlipidemia, unspecified: Secondary | ICD-10-CM

## 2014-06-01 DIAGNOSIS — I471 Supraventricular tachycardia, unspecified: Secondary | ICD-10-CM

## 2014-06-01 DIAGNOSIS — I10 Essential (primary) hypertension: Secondary | ICD-10-CM

## 2014-06-01 DIAGNOSIS — R002 Palpitations: Secondary | ICD-10-CM

## 2014-06-01 NOTE — Patient Instructions (Signed)
Your physician recommends that you continue on your current medications as directed. Please refer to the Current Medication list given to you today. Your physician has recommended that you have an ablation. Catheter ablation is a medical procedure used to treat some cardiac arrhythmias (irregular heartbeats). During catheter ablation, a long, thin, flexible tube is put into a blood vessel in your groin (upper thigh), or neck. This tube is called an ablation catheter. It is then guided to your heart through the blood vessel. Radio frequency waves destroy small areas of heart tissue where abnormal heartbeats may cause an arrhythmia to start. Please see the instruction sheet given to you today.  SOME AVAILABLE DATES FOR ABLATION WITH DR Ladona RidgelAYLOR ARE:  February 5, 8, 11, 15, 22, 24, 25, 29

## 2014-06-01 NOTE — Assessment & Plan Note (Signed)
I have discussed the treatment options with the patient and the risks/benefits/goals/expectations of catheter ablation have been reviewed with the patient and she wishes to proceed. She will call us to schedule a time for ablation.

## 2014-06-01 NOTE — Assessment & Plan Note (Signed)
Her blood pressure is well controlled. She will continue her current meds.  
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
## 2014-06-01 NOTE — Assessment & Plan Note (Signed)
She will maintain a low fat diet. I will defer statin therapy to Dr. Eden EmmsNishan.

## 2014-06-08 ENCOUNTER — Telehealth: Payer: Self-pay | Admitting: Internal Medicine

## 2014-06-08 DIAGNOSIS — I471 Supraventricular tachycardia: Secondary | ICD-10-CM

## 2014-06-08 NOTE — Telephone Encounter (Signed)
Pt to call this week to set up ablation, was given several dates but wants to go over those with you

## 2014-06-09 NOTE — Telephone Encounter (Signed)
Returned call to patient and let her know he could do the procedure on 07/06/14 She will come for labs on 2/15 at 10am

## 2014-06-12 ENCOUNTER — Encounter: Payer: Self-pay | Admitting: *Deleted

## 2014-06-12 ENCOUNTER — Other Ambulatory Visit: Payer: Self-pay | Admitting: *Deleted

## 2014-06-12 NOTE — Telephone Encounter (Signed)
I have her procedure scheduled and labs scheduled.

## 2014-06-29 ENCOUNTER — Other Ambulatory Visit (INDEPENDENT_AMBULATORY_CARE_PROVIDER_SITE_OTHER): Payer: Medicare Other | Admitting: *Deleted

## 2014-06-29 DIAGNOSIS — I471 Supraventricular tachycardia: Secondary | ICD-10-CM

## 2014-06-29 LAB — CBC WITH DIFFERENTIAL/PLATELET
Basophils Absolute: 0 10*3/uL (ref 0.0–0.1)
Basophils Relative: 0.4 % (ref 0.0–3.0)
EOS ABS: 0.5 10*3/uL (ref 0.0–0.7)
Eosinophils Relative: 6.6 % — ABNORMAL HIGH (ref 0.0–5.0)
HCT: 37.9 % (ref 36.0–46.0)
Hemoglobin: 12.8 g/dL (ref 12.0–15.0)
Lymphocytes Relative: 27.5 % (ref 12.0–46.0)
Lymphs Abs: 1.9 10*3/uL (ref 0.7–4.0)
MCHC: 33.8 g/dL (ref 30.0–36.0)
MCV: 88.3 fl (ref 78.0–100.0)
MONOS PCT: 6.1 % (ref 3.0–12.0)
Monocytes Absolute: 0.4 10*3/uL (ref 0.1–1.0)
NEUTROS ABS: 4.2 10*3/uL (ref 1.4–7.7)
Neutrophils Relative %: 59.4 % (ref 43.0–77.0)
PLATELETS: 384 10*3/uL (ref 150.0–400.0)
RBC: 4.3 Mil/uL (ref 3.87–5.11)
RDW: 13.6 % (ref 11.5–15.5)
WBC: 7.1 10*3/uL (ref 4.0–10.5)

## 2014-06-29 LAB — BASIC METABOLIC PANEL
BUN: 15 mg/dL (ref 6–23)
CHLORIDE: 106 meq/L (ref 96–112)
CO2: 29 meq/L (ref 19–32)
CREATININE: 0.93 mg/dL (ref 0.40–1.20)
Calcium: 9.3 mg/dL (ref 8.4–10.5)
GFR: 62.48 mL/min (ref >60.00)
GLUCOSE: 166 mg/dL — AB (ref 70–99)
POTASSIUM: 4 meq/L (ref 3.5–5.1)
Sodium: 141 mEq/L (ref 135–145)

## 2014-07-01 ENCOUNTER — Telehealth: Payer: Self-pay

## 2014-07-01 ENCOUNTER — Ambulatory Visit: Payer: Medicare Other | Admitting: Internal Medicine

## 2014-07-01 ENCOUNTER — Ambulatory Visit (INDEPENDENT_AMBULATORY_CARE_PROVIDER_SITE_OTHER): Payer: Medicare Other | Admitting: Internal Medicine

## 2014-07-01 ENCOUNTER — Encounter: Payer: Self-pay | Admitting: Internal Medicine

## 2014-07-01 VITALS — BP 124/68 | HR 65 | Temp 97.8°F | Wt 180.5 lb

## 2014-07-01 DIAGNOSIS — N390 Urinary tract infection, site not specified: Secondary | ICD-10-CM

## 2014-07-01 DIAGNOSIS — R3915 Urgency of urination: Secondary | ICD-10-CM

## 2014-07-01 LAB — POCT URINALYSIS DIPSTICK
Bilirubin, UA: NEGATIVE
Glucose, UA: NEGATIVE
KETONES UA: NEGATIVE
Nitrite, UA: NEGATIVE
PH UA: 5.5
Spec Grav, UA: >=1.03
Urobilinogen, UA: NEGATIVE

## 2014-07-01 MED ORDER — SULFAMETHOXAZOLE-TRIMETHOPRIM 800-160 MG PO TABS: 1.0000 | ORAL_TABLET | Freq: Two times a day (BID) | ORAL | Status: DC

## 2014-07-01 NOTE — Telephone Encounter (Signed)
[facility] called to clarify quantity of septra; Nicki Reaperegina Baity NP said # [phone]. [facility] voiced understanding and med list changed.

## 2014-07-01 NOTE — Progress Notes (Signed)
Pre visit review using our clinic review tool, if applicable. No additional management support is needed unless otherwise documented below in the visit note. 

## 2014-07-01 NOTE — Addendum Note (Signed)
Addended by: Roena MaladyEVONTENNO, Lalanya Rufener Y on: 07/01/2014 04:53 PM   Modules accepted: Orders
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
## 2014-07-01 NOTE — Patient Instructions (Signed)

## 2014-07-03 LAB — HM DIABETES EYE EXAM

## 2014-07-04 LAB — URINE CULTURE: Colony Count: >=100000

## 2014-07-06 ENCOUNTER — Encounter (HOSPITAL_COMMUNITY): Payer: Self-pay | Admitting: *Deleted

## 2014-07-06 ENCOUNTER — Encounter (HOSPITAL_COMMUNITY)
Admission: RE | Disposition: A | Discharge status: Home / Self Care | Payer: Self-pay | Source: Ambulatory Visit | Attending: Internal Medicine

## 2014-07-06 ENCOUNTER — Ambulatory Visit (HOSPITAL_COMMUNITY)
Admission: RE | Admit: 2014-07-06 | Discharge: 2014-07-07 | Disposition: A | Discharge status: Home / Self Care | Payer: Medicare Other | Source: Ambulatory Visit | Attending: Internal Medicine | Admitting: Internal Medicine

## 2014-07-06 DIAGNOSIS — Z79899 Other long term (current) drug therapy: Secondary | ICD-10-CM | POA: Insufficient documentation

## 2014-07-06 DIAGNOSIS — E785 Hyperlipidemia, unspecified: Secondary | ICD-10-CM | POA: Insufficient documentation

## 2014-07-06 DIAGNOSIS — F329 Major depressive disorder, single episode, unspecified: Secondary | ICD-10-CM | POA: Diagnosis not present

## 2014-07-06 DIAGNOSIS — E119 Type 2 diabetes mellitus without complications: Secondary | ICD-10-CM | POA: Insufficient documentation

## 2014-07-06 DIAGNOSIS — F419 Anxiety disorder, unspecified: Secondary | ICD-10-CM | POA: Insufficient documentation

## 2014-07-06 DIAGNOSIS — K219 Gastro-esophageal reflux disease without esophagitis: Secondary | ICD-10-CM | POA: Diagnosis not present

## 2014-07-06 DIAGNOSIS — I2510 Atherosclerotic heart disease of native coronary artery without angina pectoris: Secondary | ICD-10-CM | POA: Diagnosis not present

## 2014-07-06 DIAGNOSIS — I10 Essential (primary) hypertension: Secondary | ICD-10-CM | POA: Diagnosis not present

## 2014-07-06 DIAGNOSIS — I471 Supraventricular tachycardia, unspecified: Secondary | ICD-10-CM | POA: Diagnosis present

## 2014-07-06 HISTORY — PX: SUPRAVENTRICULAR TACHYCARDIA ABLATION: SHX5492

## 2014-07-06 LAB — GLUCOSE, CAPILLARY
GLUCOSE-CAPILLARY: 135 mg/dL — AB (ref 70–99)
GLUCOSE-CAPILLARY: 135 mg/dL — AB (ref 70–99)
GLUCOSE-CAPILLARY: 98 mg/dL (ref 70–99)
Glucose-Capillary: 98 mg/dL (ref 70–99)
Glucose-Capillary: 183 mg/dL — ABNORMAL HIGH (ref 70–99)

## 2014-07-06 LAB — POCT ACTIVATED CLOTTING TIME: Activated Clotting Time: 159 seconds

## 2014-07-06 SURGERY — SUPRAVENTRICULAR TACHYCARDIA ABLATION
Anesthesia: LOCAL

## 2014-07-06 MED ORDER — TRAMADOL HCL 50 MG PO TABS
50.0000 mg | ORAL_TABLET | Freq: Four times a day (QID) | ORAL | Status: DC | PRN
  Administered 2014-07-06 (×2): 50 mg via ORAL
  Filled 2014-07-06 (×2): qty 1

## 2014-07-06 MED ORDER — SODIUM CHLORIDE 0.9 % IV SOLN
2.0000 ug/min | INTRAVENOUS | Status: DC
  Filled 2014-07-06: qty 2

## 2014-07-06 MED ORDER — HEPARIN (PORCINE) IN NACL 2-0.9 UNIT/ML-% IJ SOLN
INTRAMUSCULAR | Status: AC
  Filled 2014-07-06: qty 500

## 2014-07-06 MED ORDER — HEPARIN SODIUM (PORCINE) 1000 UNIT/ML IJ SOLN
INTRAMUSCULAR | Status: AC
  Filled 2014-07-06: qty 1

## 2014-07-06 MED ORDER — MIDAZOLAM HCL 5 MG/5ML IJ SOLN
INTRAMUSCULAR | Status: AC
  Filled 2014-07-06: qty 5

## 2014-07-06 MED ORDER — FENTANYL CITRATE 0.05 MG/ML IJ SOLN
25.0000 ug | Freq: Once | INTRAMUSCULAR | Status: AC
  Administered 2014-07-06: 25 ug via INTRAVENOUS

## 2014-07-06 MED ORDER — OFF THE BEAT BOOK
Freq: Once | Status: AC
  Administered 2014-07-06: 13:00:00
  Filled 2014-07-06: qty 1

## 2014-07-06 MED ORDER — ONDANSETRON HCL 4 MG/2ML IJ SOLN: 4.0000 mg | Freq: Four times a day (QID) | INTRAMUSCULAR | Status: DC | PRN

## 2014-07-06 MED ORDER — BUPIVACAINE HCL (PF) 0.25 % IJ SOLN
INTRAMUSCULAR | Status: AC
  Filled 2014-07-06: qty 60

## 2014-07-06 MED ORDER — FENTANYL CITRATE 0.05 MG/ML IJ SOLN
INTRAMUSCULAR | Status: AC
  Filled 2014-07-06: qty 2

## 2014-07-06 MED ORDER — CELECOXIB 200 MG PO CAPS
200.0000 mg | ORAL_CAPSULE | Freq: Every day | ORAL | Status: DC | PRN
  Administered 2014-07-06: 200 mg via ORAL
  Filled 2014-07-06 (×2): qty 1

## 2014-07-06 MED ORDER — LOSARTAN POTASSIUM 50 MG PO TABS
50.0000 mg | ORAL_TABLET | Freq: Every evening | ORAL | Status: DC
  Administered 2014-07-06: 50 mg via ORAL
  Filled 2014-07-06 (×2): qty 1

## 2014-07-06 NOTE — CV Procedure (Signed)
EP Procedure Note  Preoperative diagnosis: recurrent SVT  Postoperative diagnosis: recurrent SVT due to atrial tachycardia originating on the his bundle  Description of the procedure: After informed consent was obtained, the patient was taken to the diagnostic electrophysiology laboratory in the fasting state. After the usual preparation draping, intravenous Versed and fentanyl was used for sedation. A 6 French quadripolar catheter was inserted percutaneously into the right femoral vein and advanced to the right ventricle. A 6 French quadripolar catheter was inserted percutaneously into the right femoral vein and advanced to the His bundle region. A 6 French hexapolar catheter was inserted percutaneously into the right jugular vein and advanced to the coronary sinus. After measurement of the basic intervals, rapid ventricular pacing was carried out from the right ventricle which demonstrated VA block at 600 ms. Next programmed ventricular stimulation was carried out from the right ventricle demonstrating VA block at 600 ms. Next programmed atrial stimulation was carried out from the coronary sinus at a pacing cycle length of 600 ms. The S1-S2 interval was stepwise decreased from 540 ms down to 390 ms where the AV node ERP was observed. During programmed atrial stimulation, there was no AH jump, and no echo beats noted. Next, rapid atrial pacing was carried out from the coronary sinus at 600 ms. The pacing interval was decremented down to 410 ms were AV block was demonstrated. During rapid atrial pacing, the PR interval was less than the RR interval.  Next isoproterenol was infused at rates between 1 and 4 mics per minute. Additional rapid ventricular pacing, programmed ventricular stimulation, programmed atrial stimulation, and rapid atrial pacing was carried out. During programmed atrial stimulation, the S1-S2 interval was decreased from 340 ms down to 310 ms resulting in the initiation of SVT. This was a  long RP tachycardia. PVCs were placed at the time of his bundle refractoriness, and did not pre-excited the atrium. Pacing during tachycardia from the ventricle demonstrated a VAAV activation sequence. At this point, a diagnosis of atrial tachycardia was made. Mapping was then carried out. The earliest atrial activation was present along the region of the his bundle. Mapping elsewhere in the atrium as well as in the coronary sinus demonstrated later atrial activation. Manipulation of the catheter along the His bundle, resulted in spontaneous termination of the tachycardia. At this point 3 radiofrequency energy applications were delivered to the parahisian region in the right atrium. The tachycardia would immediately terminate, but after a few minutes, could be reinduced. Because of the concern for complete heart block, radiofrequency energy was attenuated. Next the right femoral artery was punctured and the 7 French quadripolar ablation catheter was inserted percutaneously into the right femoral artery and advanced under fluoroscopic guidance into the region of the aortic root. 5000 units of heparin was given intravenously. Mapping of the atrial tachycardia was carried out in the non-coronary sinus. 3 additional radiofrequency energy applications were carried out in this region during tachycardia. During the first radiofrequency energy application, the tachycardia was terminated. AV conduction remained present. 2 bonus radiofrequency energy applications were delivered. The patient was observed for approximately 15 minutes. There is no inducible SVT. At this point the catheters were removed and the patient was returned to the recovery area in satisfactory condition.  Complications: There were no immediate procedure complications  Results. A. Baseline ECG. The baseline ECG demonstrated normal sinus rhythm. B. Baseline intervals. The sinus node cycle length was 946 ms. The HV interval was 57 ms. The AH interval  was 92  ms. The QRS duration was 83 ms. C. Rapid ventricular pacing. Rapid ventricular pacing demonstrated VA dissociation at 600 ms. D. Programmed ventricular stimulation. Programmed ventricular stimulation demonstrated VA dissociation at 600 ms. E. Rapid atrial pacing. Rapid atrial pacing was carried out from the atrium at a pacing cycle length of 600 ms. The pacing interval was decreased down to 410 ms were AV block was demonstrated. F. Programmed atrial stimulation. Programmed atrial stimulation was carried out from the atrium at pacing cycle lengths of 600, 500, and 400 ms. On isoproterenol, pacing and S1-S2 interval of 400/310 resulted in the induction of SVT.  G. Arrhythmias observed. Atrial tachycardia, para hisian, initiation was with programmed atrial stimulation on isoproterenol, cycle length was 390 ms, termination was spontaneous and with catheter ablation H. Mapping. Mapping of the atrial tachycardia demonstrated the earliest atrial activation along the Parahisian region of the right atrium. While the tachycardia could be terminated by ablating very close to the AV node on the right atrial side, it was always re-inducible. Successful ablation was carried out in the non-coronary cusp of the aortic root, rendering the tachycardia noninducible. I. Radiofrequency energy application. 6 radiofrequency energy applications were delivered including 3 radiofrequency energy applications in the non-coronary cusp of the aortic root.  Conclusion: Successful EP study and catheter ablation of a Parahisian atrial tachycardia originating around the AV node of the right atrium with successful catheter ablation in the non-coronary cusp of the aortic root. There are no immediate procedure complications.  Lewayne BuntingGregg Taylor, M.D.

## 2014-07-06 NOTE — Progress Notes (Signed)
Site area: rt IJ Site Prior to Removal:  Level  0 Pressure Applied For: 10 minutes, removed by LMurphy,RN Manual:   yes Patient Status During Pull:  stable Post Pull Site:  Level  0 Post Pull Instructions Given:  yes Post Pull Pulses Present: na  Dressing Applied:  tegaderm Bedrest begins @   Comments:  0 complications

## 2014-07-06 NOTE — Progress Notes (Signed)
Site area: rt groin Site Prior to Removal:  Level  0 Pressure Applied For:  25 minutes Manual:   yes Patient Status During Pull:  stable Post Pull Site:  Level  0 Post Pull Instructions Given:  yes Post Pull Pulses Present: yes Dressing Applied:  tegaderm Bedrest begins @  1105 Comments: no complications

## 2014-07-06 NOTE — Progress Notes (Signed)
Patient states back pain is better. IV saline locked.

## 2014-07-06 NOTE — Discharge Summary (Signed)
ELECTROPHYSIOLOGY PROCEDURE DISCHARGE SUMMARY    Patient ID: Maria [name],  MRN: [id number], DOB/AGE: 75-10-1940 75 y.o.  Admit date: 07/06/2014 Discharge date: 07/07/2014  Primary Care Physician: Crawford GivensGraham Duncan, MD Primary Cardiologist: Eden EmmsNishan Electrophysiologist: Ladona Ridgelaylor  Primary Discharge Diagnosis:  SVT status post ablation this admission  Secondary Discharge Diagnosis:  1.  Hypertension 2.  Hyperlipidemia 3.  GERD 4.  Diabetes 5.  DDD   Allergies  Allergen Reactions  . Acetaminophen     REACTION: jittery with plain tylenol, but tolerated tylenol/benadryl combination  . Carvedilol     REACTION: nightmares  . Crestor [Rosuvastatin]     aches  . Ezetimibe-Simvastatin     REACTION: muscle aches  . Ibuprofen     REACTION: GI upset  . Insulin Glargine     Rash  . Levemir [Insulin Detemir]     rash  . Macrobid [Nitrofurantoin Macrocrystal] Other (See Comments)    Achey, N & V  . Metformin     REACTION: GI upset  . Metoprolol Tartrate     REACTION: nightmares  . Naproxen     REACTION: GI upset     Procedures This Admission:  1.  Electrophysiology study and radiofrequency catheter ablation on 07/06/14 by Dr Ladona Ridgelaylor.  This study demonstrated parahisian atrial tachycardia. After initially ablated without success from the parahisian region of the right atrium, the ablation catheter was placed in the non-coronary cusp of the aortic root and was successfully ablated.  There were no early apparent complications.   Brief HPI: Maria [name] is a 74 y.o. female with a past medical history as outlined above.  She has had recurrent SVT despite medical therapy which could be terminated by adenosine. She was referred to EP for treatment options.  Risks, benefits, and alternatives to EPS and ablation were reviewed with the patient who wished to proceed.   Hospital Course:  Maria [name] is a 74 y.o. female was admitted and underwent ablation of atrial tach with details as  outlined above.  She was monitored on telemetry overnight which demonstrated sinus rhythm with PAC's.  Her neck and groin incisions were without complication.  She was evaluated by Dr Ladona Ridgelaylor who considered her stable for discharge to home. She will be seen in follow up in 4-6 weeks.   Discharge Vitals: Blood pressure 126/45, pulse 71, temperature 97.8 F (36.6 C), temperature source Oral, resp. rate 20, height 5\' 6"  (1.676 m), weight 169 lb 1.5 oz (76.7 kg), SpO2 96 %.    Labs:   Lab Results  Component Value Date   WBC 7.1 06/29/2014   HGB 12.8 06/29/2014   HCT 37.9 06/29/2014   MCV 88.3 06/29/2014   PLT 384.0 06/29/2014   No results for input(s): NA, K, CL, CO2, BUN, CREATININE, CALCIUM, PROT, BILITOT, ALKPHOS, ALT, AST, GLUCOSE in the last 168 hours.  Invalid input(s): LABALBU No results found for: CKTOTAL, CKMB, CKMBINDEX, TROPONINI    Discharge Medications:    Medication List    ASK your doctor about these medications        atenolol 50 MG tablet  Commonly known as:  TENORMIN  Take 1 tablet (50 mg total) by mouth daily.     celecoxib 200 MG capsule  Commonly known as:  CELEBREX  Take 1 capsule (200 mg total) by mouth daily as needed.     fluticasone 50 MCG/ACT nasal spray  Commonly known as:  FLONASE  Place 2 sprays into both nostrils daily.  glyBURIDE 5 MG tablet  Commonly known as:  DIABETA  1 tab in AM and 0.5-1 tab in PM     losartan 50 MG tablet  Commonly known as:  COZAAR  Take 1 tablet (50 mg total) by mouth every evening.     magnesium oxide 400 MG tablet  Commonly known as:  MAG-OX  Take 400 mg by mouth daily.     omeprazole 40 MG capsule  Commonly known as:  PRILOSEC  Take 1 capsule by mouth daily as needed. Heartburn or acid reflux     sulfamethoxazole-trimethoprim 800-160 MG per tablet  Commonly known as:  BACTRIM DS,SEPTRA DS  Take 1 tablet by mouth 2 (two) times daily.     traMADol 50 MG tablet  Commonly known as:  ULTRAM  Take 1 tablet  (50 mg total) by mouth every 6 (six) hours as needed.        Disposition:     Duration of Discharge Encounter: less than 30 minutes including physician time.  Signed,  Lewayne Bunting, M.D.

## 2014-07-07 DIAGNOSIS — I471 Supraventricular tachycardia: Secondary | ICD-10-CM

## 2014-07-07 DIAGNOSIS — I10 Essential (primary) hypertension: Secondary | ICD-10-CM | POA: Diagnosis not present

## 2014-07-07 DIAGNOSIS — I2510 Atherosclerotic heart disease of native coronary artery without angina pectoris: Secondary | ICD-10-CM | POA: Diagnosis not present

## 2014-07-07 DIAGNOSIS — E119 Type 2 diabetes mellitus without complications: Secondary | ICD-10-CM | POA: Diagnosis not present

## 2014-07-07 LAB — GLUCOSE, CAPILLARY: Glucose-Capillary: 115 mg/dL — ABNORMAL HIGH (ref 70–99)

## 2014-07-07 MED ORDER — TRAMADOL HCL 50 MG PO TABS
50.0000 mg | ORAL_TABLET | Freq: Four times a day (QID) | ORAL | Status: DC | PRN
  Administered 2014-07-07: 50 mg via ORAL
  Filled 2014-07-07: qty 1

## 2014-07-07 NOTE — Discharge Instructions (Signed)
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
## 2014-07-17 ENCOUNTER — Encounter: Payer: Self-pay | Admitting: Family Medicine

## 2014-07-27 ENCOUNTER — Ambulatory Visit (INDEPENDENT_AMBULATORY_CARE_PROVIDER_SITE_OTHER): Payer: Medicare Other | Admitting: Family Medicine

## 2014-07-27 ENCOUNTER — Encounter: Payer: Self-pay | Admitting: Family Medicine

## 2014-07-27 VITALS — BP 142/70 | HR 66 | Temp 98.4°F | Wt 177.5 lb

## 2014-07-27 DIAGNOSIS — E119 Type 2 diabetes mellitus without complications: Secondary | ICD-10-CM

## 2014-07-27 DIAGNOSIS — H6980 Other specified disorders of Eustachian tube, unspecified ear: Secondary | ICD-10-CM

## 2014-07-27 MED ORDER — GLYBURIDE 5 MG PO TABS: ORAL_TABLET | ORAL | Status: DC

## 2014-07-27 NOTE — Patient Instructions (Signed)
Go to the lab on the way out.  We'll contact you with your lab report. Take care.  Glad to see you.  Recheck in about 6 months at a physical with labs ahead of time.

## 2014-07-27 NOTE — Progress Notes (Signed)
Pre visit review using our clinic review tool, if applicable. No additional management support is needed unless otherwise documented below in the visit note.  Diabetes:  Using medications without difficulties: yes Hypoglycemic episodes: only rarely.  D/w pt about precautions.   Hyperglycemic episodes:no Feet problems: no Blood Sugars averaging: up to 150s in AMs, lower later in the day.   eye exam within last year:yes Due for f/u A1c.   She has some nonspecific fatigue.  No nightsweats, no fevers, no bruising.    She has f/u with Dr. Danielle DessElsner pending about her back.   Sister recently dx'd with myasthenia gravis.   Meds, vitals, and allergies reviewed.   ROS: See HPI.  Otherwise negative.    GEN: nad, alert and oriented HEENT: mucous membranes moist NECK: supple w/o LA CV: rrr. PULM: ctab, no inc wob ABD: soft, +bs EXT: no edema SKIN: no acute rash  Diabetic foot exam: Normal inspection No skin breakdown No calluses  Normal DP pulses Normal sensation to light touch and monofilament Nails normal

## 2014-07-28 ENCOUNTER — Encounter: Payer: Self-pay | Admitting: Family Medicine

## 2014-07-28 LAB — HEMOGLOBIN A1C: HEMOGLOBIN A1C: 6.3 % (ref 4.6–6.5)

## 2014-07-28 NOTE — Assessment & Plan Note (Signed)
She had f/u with ENT. Hearing improved, she didn't need extra treatment.  She has f/u pending.

## 2014-07-28 NOTE — Assessment & Plan Note (Signed)
Recheck A1c today, wouldn't change meds at this point.  Continue work on diet and exercise.  She agrees.

## 2014-08-06 ENCOUNTER — Ambulatory Visit (INDEPENDENT_AMBULATORY_CARE_PROVIDER_SITE_OTHER): Payer: Medicare Other | Admitting: Internal Medicine

## 2014-08-06 ENCOUNTER — Encounter: Payer: Self-pay | Admitting: Internal Medicine

## 2014-08-06 VITALS — BP 136/64 | HR 62 | Ht 66.0 in | Wt 180.2 lb

## 2014-08-06 DIAGNOSIS — R55 Syncope and collapse: Secondary | ICD-10-CM | POA: Diagnosis not present

## 2014-08-06 DIAGNOSIS — I471 Supraventricular tachycardia, unspecified: Secondary | ICD-10-CM

## 2014-08-06 DIAGNOSIS — I10 Essential (primary) hypertension: Secondary | ICD-10-CM | POA: Diagnosis not present

## 2014-08-06 NOTE — Assessment & Plan Note (Signed)
She would like to stop her atenolol. I have given her instructions to wean atenolol to off over the next month. She may require additional blood pressure lowering medications. She will continue losartan.

## 2014-08-06 NOTE — Progress Notes (Signed)
HPI Maria [name] returns today for followup. She is a pleasant 74 yo woman with refractory SVT who underwent catheter ablation of a parahisian atrial tachycardia which was successfully ablated from the non-coronary cusp of the aortic root. Since ablation she has done well. She has HTN and has been on atenolol and would like to come off of this medication. She does have HTN. No syncope and minimal palpitations. Allergies  Allergen Reactions  . Acetaminophen     REACTION: jittery with plain tylenol, but tolerated tylenol/benadryl combination  . Carvedilol     REACTION: nightmares  . Crestor [Rosuvastatin]     aches  . Ezetimibe-Simvastatin     REACTION: muscle aches  . Ibuprofen     REACTION: GI upset  . Insulin Glargine     Rash  . Levemir [Insulin Detemir]     rash  . Macrobid [Nitrofurantoin Macrocrystal] Other (See Comments)    Achey, N & V  . Metformin     REACTION: GI upset  . Metoprolol Tartrate     REACTION: nightmares  . Naproxen     REACTION: GI upset     Current Outpatient Prescriptions  Medication Sig Dispense Refill  . celecoxib (CELEBREX) 200 MG capsule Take 1 capsule (200 mg total) by mouth daily as needed. (Patient taking differently: Take 200 mg by mouth daily as needed (pain). ) 30 capsule 5  . glyBURIDE (DIABETA) 5 MG tablet Take 1 tablet by mouth in AM and 0.5-1 tablet by mouth in PM 180 tablet 3  . losartan (COZAAR) 50 MG tablet Take 1 tablet (50 mg total) by mouth every evening. 90 tablet 3  . omeprazole (PRILOSEC) 40 MG capsule Take 1 capsule by mouth daily as needed. Heartburn or acid reflux    . traMADol (ULTRAM) 50 MG tablet Take 1 tablet (50 mg total) by mouth every 6 (six) hours as needed. (Patient taking differently: Take 50 mg by mouth every 6 (six) hours as needed (pain). ) 100 tablet 5   No current facility-administered medications for this visit.     Past Medical History  Diagnosis Date  . Hypertension 1987  . Hyperlipidemia 1990  .  GERD (gastroesophageal reflux disease)   . Diabetes mellitus 2002    Type II  . Depression 1988  . Anxiety 1988  . Anemia 05/1985  . Right knee pain 02/2010    Injected - Dr. Shelle IronBeane  . Renal cyst 07/10/2006    bilateral  . DDD (degenerative disc disease), lumbar   . DDD (degenerative disc disease), cervical   . Goiter     multinodular  . Reflux esophagitis   . PSVT (paroxysmal supraventricular tachycardia)   . Syncope   . Fracture of lower leg 07/2001    Right    ROS:   All systems reviewed and negative except as noted in the HPI.   Past Surgical History  Procedure Laterality Date  . Esophagogastroduodenoscopy  08/14/2005    gastropathy biopsy, negative  . Arthrodesis  10/03/2005    C4/5; C5/6 surgical decompression  . Cholecystectomy  08/1992  . Partial hysterectomy  1968    spotting  . Breast lumpectomy  1967    multiple B9  . Laminectomy  09/2001    due to herniated disc  . Ablation of dysrhythmic focus  07/06/2014    dr taylor  . Supraventricular tachycardia ablation N/A 07/06/2014    Procedure: SUPRAVENTRICULAR TACHYCARDIA ABLATION;  Surgeon: Marinus MawGregg W Taylor, MD;  [facility];  Service: Cardiovascular;  Laterality: N/A;     Family History  Problem Relation Age of Onset  . Hypertension Mother   . Heart disease Mother   . Diabetes Sister   . Myasthenia gravis Sister   . Diabetes Brother     1/2 juvenile; foot ulcer; periph. neuropathy  . Depression Paternal Aunt   . Hypertension Maternal Grandmother   . Diabetes Maternal Grandmother   . Stroke Maternal Grandmother   . Heart disease Maternal Grandfather     MI  . Hypertension Maternal Grandfather   . Hypertension Paternal Grandmother   . Diabetes Paternal Grandmother   . Hypertension Paternal Grandfather   . Diabetes Paternal Grandfather      History   Social History  . Marital Status: Married    Spouse Name: N/A  . Number of Children: 2  . Years of Education: N/A   Occupational  History  . Retired; Hotel manager, [employer]     Works a couple days a week   Social History Main Topics  . Smoking status: Never Smoker   . Smokeless tobacco: Never Used  . Alcohol Use: No  . Drug Use: No  . Sexual Activity: Not on file   Other Topics Concern  . Not on file   Social History Narrative   Widowed 2011 after 52 years of marriage (husband had C.diff)   Remarried 2015     BP 136/64 mmHg  Pulse 62  Ht  (1.676 m)  Wt 180 lb 3.2 oz (81.738 kg)  BMI 29.10 kg/m2  Physical Exam:  Well appearing NAD HEENT: Unremarkable Neck:  No JVD, no thyromegally Lymphatics:  No adenopathy Back:  No CVA tenderness Lungs:  Clear with no wheezes, rales, or rhonchi HEART:  Regular rate rhythm, no murmurs, no rubs, no clicks Abd:  soft, positive bowel sounds, no organomegally, no rebound, no guarding Ext:  2 plus pulses, no edema, no cyanosis, no clubbing Skin:  No rashes no nodules Neuro:  CN II through XII intact, motor grossly intact  EKG - NSR with RBBB   Assess/Plan:

## 2014-08-06 NOTE — Patient Instructions (Signed)
Your physician has recommended you make the following change in your medication:  Take Atenolol 1/2 tablet daily for 2 weeks, and then take Atenolol 1/2 tablet every other day for 2 weeks  Your physician recommends that you schedule a follow-up appointment as needed with Dr. Ladona Ridgelaylor.

## 2014-08-06 NOTE — Assessment & Plan Note (Signed)
No recurrent symptoms. Will undergo watchful waiting.

## 2014-08-06 NOTE — Assessment & Plan Note (Signed)
She has had no recurrent symptoms since her ablation.

## 2014-09-29 NOTE — Progress Notes (Signed)
Patient ID: Maria [name], female   DOB: 02-29-1941, 74 y.o.   MRN: [id number] Maria MccreedyBarbara is seen today for F/U of SVT, palpitations, HTN and elevated lipids. She has no documented CAD.and a negative myovue in 2008.  Been intolerant to statins including crestor and zocor in past  She underwent successful ablation from noncoronary sinus for SVT by Dr Ladona Ridgelaylor. March 2016 weaned off atenolol  Still on ARB for HTN  A1c improved last month 6.3    Tried to lower atenolol but palpitations returned  Skips 3-4x/week no fast SVT like symptoms  Voice is hoarse  Has seen Rosen in past   ROS: Denies fever, malais, weight loss, blurry vision, decreased visual acuity, cough, sputum, SOB, hemoptysis, pleuritic pain, palpitaitons, heartburn, abdominal pain, melena, lower extremity edema, claudication, or rash.  All other systems reviewed and negative  General: Affect appropriate Healthy:  appears stated age HEENT: normal Neck supple with no adenopathy JVP normal no bruits no thyromegaly Lungs clear with no wheezing and good diaphragmatic motion Heart:  S1/S2 no murmur, no rub, gallop or click PMI normal Abdomen: benighn, BS positve, no tenderness, no AAA no bruit.  No HSM or HJR Distal pulses intact with no bruits No edema Neuro non-focal Skin warm and dry No muscular weakness   Current Outpatient Prescriptions  Medication Sig Dispense Refill  . atenolol (TENORMIN) 50 MG tablet Take 1 tablet by mouth daily.    . celecoxib (CELEBREX) 200 MG capsule Take 1 capsule (200 mg total) by mouth daily as needed. (Patient taking differently: Take 200 mg by mouth daily as needed (pain). ) 30 capsule 5  . glyBURIDE (DIABETA) 5 MG tablet Take 1 tablet by mouth in AM and 0.5-1 tablet by mouth in PM 180 tablet 3  . losartan (COZAAR) 50 MG tablet Take 1 tablet (50 mg total) by mouth every evening. 90 tablet 3  . omeprazole (PRILOSEC) 40 MG capsule Take 1 capsule by mouth daily as needed. Heartburn or acid reflux     . traMADol (ULTRAM) 50 MG tablet Take 1 tablet (50 mg total) by mouth every 6 (six) hours as needed. (Patient taking differently: Take 50 mg by mouth every 6 (six) hours as needed (pain). ) 100 tablet 5   No current facility-administered medications for this visit.    Allergies  Acetaminophen; Carvedilol; Crestor; Ezetimibe-simvastatin; Ibuprofen; Insulin glargine; Levemir; Macrobid; Metformin; Metoprolol tartrate; and Naproxen  Electrocardiogram:  5/14  SR normal ST segments LAFB   08/06/14  NSR rate 71  LAD no sig change   Assessment and Plan SVT:  Post ablation Palpitations returning with weaning of beta blocker Continue current dose of atenolol  Event monitor  F/u Ladona Ridgelaylor HTN:  Well controlled.  Continue current medications and low sodium Dash type diet.   Hoarse:  Recommended Dr Jearld FentonByers or Annalee GentaShoemaker for evaluation of vocal cords GERD:  Continue prilosec stable   Charlton HawsPeter Cayne Yom

## 2014-09-30 ENCOUNTER — Ambulatory Visit (INDEPENDENT_AMBULATORY_CARE_PROVIDER_SITE_OTHER): Payer: Medicare Other | Admitting: Cardiovascular Disease

## 2014-09-30 ENCOUNTER — Encounter: Payer: Self-pay | Admitting: Cardiovascular Disease

## 2014-09-30 VITALS — BP 132/62 | HR 64 | Ht 66.0 in | Wt 180.0 lb

## 2014-09-30 DIAGNOSIS — R002 Palpitations: Secondary | ICD-10-CM | POA: Diagnosis not present

## 2014-09-30 NOTE — Patient Instructions (Signed)
Medication Instructions:  NO  CHANGES  Labwork: NONE  Testing/Procedures: Your physician has recommended that you wear an event monitor. Event monitors are medical devices that record the heart's electrical activity. Doctors most often us these monitors to diagnose arrhythmias. Arrhythmias are problems with the speed or rhythm of the heartbeat. The monitor is a small, portable device. You can wear one while you do your normal daily activities. This is usually used to diagnose what is causing palpitations/syncope (passing out).   Follow-Up: Your physician recommends that you schedule a follow-up appointment in:  4-6  WEEKS  AFTER  MONITOR   WITH  DR Ladona RidgelAYLOR 6 MONTHS  WITH  DR Eden EmmsNISHAN Any Other Special Instructions Will Be Listed Below (If Applicable).

## 2014-10-08 ENCOUNTER — Ambulatory Visit (INDEPENDENT_AMBULATORY_CARE_PROVIDER_SITE_OTHER): Payer: Medicare Other

## 2014-10-08 ENCOUNTER — Telehealth: Payer: Self-pay | Admitting: *Deleted

## 2014-10-08 DIAGNOSIS — R002 Palpitations: Secondary | ICD-10-CM

## 2014-10-08 NOTE — Telephone Encounter (Signed)
[employer] called to report event from today at 11:22 am (cst) showing Sinus Bradycardia with pauses: 2.6 sec to 3.4 sec. Patient called and she reported that she had been walking up stairs and became mildly SOB and lightheaded. She decided to lay down and rest for a few minutes. She states this resolved her sx. [employer] did a follow up strip and it showed Sinus Rhythm. Monitor strip sent to office by fax for review. Dr. Mayford Knifeurner reviewed it and orders the following: "Hold Atenolol and continue monitor".   Notified patient. She verbalized understanding of order to hold Atenolol. She will continue monitor.

## 2014-10-12 ENCOUNTER — Other Ambulatory Visit: Payer: Self-pay | Admitting: Family Medicine

## 2014-10-26 ENCOUNTER — Telehealth: Payer: Self-pay | Admitting: Cardiovascular Disease

## 2014-10-26 NOTE — Telephone Encounter (Signed)
Pt c/o of Chest Pain: 1. Are you having CP right now? No 2. Are you experiencing any other symptoms (ex. SOB, nausea, vomiting, sweating)? No heart rate just drops  3. How long have you been experiencing CP? Since the ablation  4. Is your CP continuous or coming and going? Coming and going  5. Have you taken Nitroglycerin? No    Comments: Yesterday afternoon she has had Chest tightness after she has walked up a flight of stairs. She also has pain when she walks up an incline

## 2014-10-26 NOTE — Telephone Encounter (Signed)
Calling stating she has had some tightness/uncomfortable feeling in her chest yesterday and this morning when climbs stairs/activity. States tightness goes away once she sits down. Also is having some SOB. Does not have BP cuff with her at this house so hasn't taken BP or HR. Marland KitchenLast week BP was 150/70 HR 80-85. States she felt like HR was irreg this AM but was not fast. She had an ablation in Feb and has felt good since then until started having the chest tightness and discomfort since last PM.  Has taken her Atenolol 50 mg and Cozaar 50 mg today. Made her an appointment to see Sunday Spillers tomorrow 6/15 at 2:30.  Advised to be here at 2:15 with her medications.  She does have a follow up with Dr. Ladona Ridgel on Monday 6/20.

## 2014-10-27 ENCOUNTER — Encounter: Payer: Self-pay | Admitting: Nurse Practitioner

## 2014-10-27 ENCOUNTER — Ambulatory Visit (INDEPENDENT_AMBULATORY_CARE_PROVIDER_SITE_OTHER): Payer: Medicare Other | Admitting: Nurse Practitioner

## 2014-10-27 VITALS — BP 150/60 | HR 79 | Ht 66.0 in | Wt 180.6 lb

## 2014-10-27 DIAGNOSIS — R0789 Other chest pain: Secondary | ICD-10-CM

## 2014-10-27 DIAGNOSIS — I471 Supraventricular tachycardia: Secondary | ICD-10-CM

## 2014-10-27 DIAGNOSIS — I10 Essential (primary) hypertension: Secondary | ICD-10-CM

## 2014-10-27 DIAGNOSIS — R0600 Dyspnea, unspecified: Secondary | ICD-10-CM

## 2014-10-27 LAB — BASIC METABOLIC PANEL
BUN: 17 mg/dL (ref 6–23)
CO2: 29 mEq/L (ref 19–32)
Calcium: 9.4 mg/dL (ref 8.4–10.5)
Chloride: 104 mEq/L (ref 96–112)
Creatinine, Ser: 0.9 mg/dL (ref 0.40–1.20)
GFR: 64.84 mL/min (ref >60.00)
Glucose, Bld: 182 mg/dL — ABNORMAL HIGH (ref 70–99)
Potassium: 4 mEq/L (ref 3.5–5.1)
Sodium: 137 mEq/L (ref 135–145)

## 2014-10-27 LAB — CBC
HCT: 39.8 % (ref 36.0–46.0)
Hemoglobin: 13.2 g/dL (ref 12.0–15.0)
MCHC: 33.3 g/dL (ref 30.0–36.0)
MCV: 90.7 fl (ref 78.0–100.0)
Platelets: 400 10*3/uL (ref 150.0–400.0)
RBC: 4.39 Mil/uL (ref 3.87–5.11)
RDW: 13.1 % (ref 11.5–15.5)
WBC: 7.5 10*3/uL (ref 4.0–10.5)

## 2014-10-27 LAB — BRAIN NATRIURETIC PEPTIDE: Pro B Natriuretic peptide (BNP): 37 pg/mL (ref 0.0–100.0)

## 2014-10-27 MED ORDER — ASPIRIN EC 81 MG PO TBEC: 81.0000 mg | DELAYED_RELEASE_TABLET | Freq: Every day | ORAL | Status: DC

## 2014-10-27 NOTE — Progress Notes (Signed)
CARDIOLOGY OFFICE NOTE  Date:  10/27/2014    Maria [name] Date of Birth: 22-Jul-1940 Medical Record #[id number]  PCP:  Crawford Givens, MD  Cardiologist:  [name] & [facility]    Chief Complaint  Patient presents with  . Chest Pain    Work in visit - seen for Dr. [name]    History of Present Illness: Maria [name] is a 74 y.o. female who presents today for a work in visit. Seen for Dr. [name] and Dr. Ladona Ridgel. She has no known CAD. Has SVT and has had recent ablation per Dr. Ladona Ridgel back in February of 2016. Other issues include HTN, HLD, GERD and DM. Reportedly with Myoview from 2008 - I am not able to locate in EPIC. Echo from 2008 with normal LV function.  Phone call yesterday - "Calling stating she has had some tightness/uncomfortable feeling in her chest yesterday and this morning when climbs stairs/activity. States tightness goes away once she sits down. Also is having some SOB. Does not have BP cuff with her at this house so hasn't taken BP or HR. Last week BP was 150/70 HR 80-85. States she felt like HR was irreg this AM but was not fast. She had an ablation in Feb and has felt good since then until started having the chest tightness and discomfort since last PM. Has taken her Atenolol 50 mg and Cozaar 50 mg today. Made her an appointment to see Sunday Spillers tomorrow 6/15 at 2:30. Advised to be here at 2:15 with her medications. She does have a follow up with Dr. Ladona Ridgel on Monday 6/20."  She does have an event monitor in place - has had a 2.6 to 3.4 second pause - she was symptomatic - beta blocker was stopped.   Thus added to my schedule for today.   Comes in today. Here alone. She does not have her event monitor on - getting put back on tomorrow with different stickers due to allergic reaction. She notes that for the past few weeks (and probably had when she saw Dr. [name] but did not mention) that she feels short of breath and has chest tightness that is exertional in  nature - has with steps, hills, any incline. Otherwise she is fine. No cough. Lots of stomach issues. Some palpitations. Notes no change in her symptoms since stopping the Atenolol. Feels like her rhythm is better. AIC is about 6. BP better and lower at home.     Past Medical History  Diagnosis Date  . Hypertension 1987  . Hyperlipidemia 1990  . GERD (gastroesophageal reflux disease)   . Diabetes mellitus 2002    Type II  . Depression 1988  . Anxiety 1988  . Anemia 05/1985  . Right knee pain 02/2010    Injected - Dr. Shelle Iron  . Renal cyst 07/10/2006    bilateral  . DDD (degenerative disc disease), lumbar   . DDD (degenerative disc disease), cervical   . Goiter     multinodular  . Reflux esophagitis   . PSVT (paroxysmal supraventricular tachycardia)   . Syncope   . Fracture of lower leg 07/2001    Right    Past Surgical History  Procedure Laterality Date  . Esophagogastroduodenoscopy  08/14/2005    gastropathy biopsy, negative  . Arthrodesis  10/03/2005    C4/5; C5/6 surgical decompression  . Cholecystectomy  08/1992  . Partial hysterectomy  1968    spotting  . Breast lumpectomy  1967    multiple  B9  . Laminectomy  09/2001    due to herniated disc  . Ablation of dysrhythmic focus  07/06/2014    dr taylor  . Supraventricular tachycardia ablation N/A 07/06/2014    Procedure: SUPRAVENTRICULAR TACHYCARDIA ABLATION;  Surgeon: Marinus Maw, MD;  Location: [facility] CATH LAB;  Service: Cardiovascular;  Laterality: N/A;     Medications: Current Outpatient Prescriptions  Medication Sig Dispense Refill  . celecoxib (CELEBREX) 200 MG capsule TAKE 1 CAPSULE BY MOUTH DAILY AS NEEDED. 30 capsule 2  . glyBURIDE (DIABETA) 5 MG tablet Take 1 tablet by mouth in AM and 0.5-1 tablet by mouth in PM 180 tablet 3  . losartan (COZAAR) 50 MG tablet Take 1 tablet (50 mg total) by mouth every evening. 90 tablet 3  . omeprazole (PRILOSEC) 40 MG capsule Take 1 capsule by mouth daily as needed.  Heartburn or acid reflux    . traMADol (ULTRAM) 50 MG tablet Take 1 tablet (50 mg total) by mouth every 6 (six) hours as needed. (Patient taking differently: Take 50 mg by mouth every 6 (six) hours as needed (pain). ) 100 tablet 5  . aspirin EC 81 MG tablet Take 1 tablet (81 mg total) by mouth daily. 90 tablet 3   No current facility-administered medications for this visit.    Allergies: Allergies  Allergen Reactions  . Acetaminophen     REACTION: jittery with plain tylenol, but tolerated tylenol/benadryl combination  . Carvedilol     REACTION: nightmares  . Crestor [Rosuvastatin]     aches  . Ezetimibe-Simvastatin     REACTION: muscle aches  . Ibuprofen     REACTION: GI upset  . Insulin Glargine     Rash  . Levemir [Insulin Detemir]     rash  . Macrobid [Nitrofurantoin Macrocrystal] Other (See Comments)    Achey, N & V  . Metformin     REACTION: GI upset  . Metoprolol Tartrate     REACTION: nightmares  . Naproxen     REACTION: GI upset    Social History: The patient  reports that she has never smoked. She has never used smokeless tobacco. She reports that she does not drink alcohol or use illicit drugs.   Family History: The patient's family history includes Depression in her paternal aunt; Diabetes in her brother, maternal grandmother, paternal grandfather, paternal grandmother, and sister; Heart disease in her maternal grandfather and mother; Hypertension in her maternal grandfather, maternal grandmother, mother, paternal grandfather, and paternal grandmother; Myasthenia gravis in her sister; Stroke in her maternal grandmother.   Review of Systems: Please see the history of present illness.   Otherwise, the review of systems is positive for none.   All other systems are reviewed and negative.   Physical Exam: VS:  BP 150/60 mmHg  Pulse 79  Ht 5\' 6"  (1.676 m)  Wt 180 lb 9.6 oz (81.92 kg)  BMI 29.16 kg/m2  SpO2 98% .  BMI Body mass index is 29.16 kg/(m^2).  Wt  Readings from Last 3 Encounters:  10/27/14 180 lb 9.6 oz (81.92 kg)  09/30/14 180 lb (81.647 kg)  08/06/14 180 lb 3.2 oz (81.738 kg)    General: Pleasant. Well developed, well nourished and in no acute distress. She is obese.  HEENT: Normal. Neck: Supple, no JVD, carotid bruits, or masses noted.  Cardiac: Regular rate and rhythm. No murmurs, rubs, or gallops. No edema.  Respiratory:  Lungs are clear to auscultation bilaterally with normal work of breathing.  GI: Soft  and nontender.  MS: No deformity or atrophy. Gait and ROM intact. Skin: Warm and dry. Color is normal.  Neuro:  Strength and sensation are intact and no gross focal deficits noted.  Psych: Alert, appropriate and with normal affect.   LABORATORY DATA:  EKG:  EKG is ordered today. This demonstrates NSR.  Lab Results  Component Value Date   WBC 7.1 06/29/2014   HGB 12.8 06/29/2014   HCT 37.9 06/29/2014   PLT 384.0 06/29/2014   GLUCOSE 166* 06/29/2014   CHOL 183 12/18/2013   TRIG 64.0 12/18/2013   HDL 39.90 12/18/2013   LDLDIRECT 168.1 02/18/2010   LDLCALC 130* 12/18/2013   ALT 15 07/31/2013   AST 17 07/31/2013   NA 141 06/29/2014   K 4.0 06/29/2014   CL 106 06/29/2014   CREATININE 0.93 06/29/2014   BUN 15 06/29/2014   CO2 29 06/29/2014   TSH 1.76 07/31/2013   HGBA1C 6.3 07/27/2014   MICROALBUR 0.5 02/16/2009    BNP (last 3 results) No results for input(s): BNP in the last 8760 hours.  ProBNP (last 3 results) No results for input(s): PROBNP in the last 8760 hours.   Other Studies Reviewed Today: ECHO SUMMARY FROM 2008 - Overall left ventricular systolic function was normal. Left    ventricular ejection fraction was estimated to be 70 %. There    were no left ventricular regional wall motion abnormalities. - There was increased thickness of the interatrial septum,    consistent with lipomatous hypertrophy. --------------------------------------------------------------- Prepared  and Electronically Authenticated by Dietrich Pates M.D. Confirmed 23-Jul-2006 16:38:16       Assessment/Plan: 1. Chest pain/dyspnea - exertional in nature and has multiple CV risk factors - will start aspirin. Myoview and echo to be updated. Lab today. Further disposition to follow.   2. Multiple CV risk factors - HTN, HLD, DM  3. Recent ablation from February for SVT - she will be putting her event monitor back on tomorrow.   4. HTN - BP reportedly better at home.   5. Pause - now off of beta blocker - no change in her symptoms. Will get the monitor put back on tomorrow.   Current medicines are reviewed with the patient today.  The patient does not have concerns regarding medicines other than what has been noted above.  The following changes have been made:  See above.  Labs/ tests ordered today include:    Orders Placed This Encounter  Procedures  . Basic metabolic panel  . CBC  . Brain natriuretic peptide  . Myocardial Perfusion Imaging  . EKG 12-Lead  . ECHOCARDIOGRAM COMPLETE     Disposition:   Further disposition to follow.   Patient is agreeable to this plan and will call if any problems develop in the interim.   Signed: Rosalio Macadamia, RN, ANP-C 10/27/2014 2:32 PM  [facility] [address] [place], [place]  [address] Phone: [phone] Fax: [phone]

## 2014-10-27 NOTE — Patient Instructions (Addendum)
We will be checking the following labs today - BNP, BMET, CBC   Medication Instructions:    Continue with your current medicines BUT  I am adding aspirin 81 mg a day - samples    Testing/Procedures To Be Arranged:  Echocardiogram  Myoview - Stress  Follow-Up:   See Dr. Ladona Ridgel as planned.     Other Special Instructions:   N/A  Call the [facility] office at [phone] if you have any questions, problems or concerns.

## 2014-11-02 ENCOUNTER — Ambulatory Visit (INDEPENDENT_AMBULATORY_CARE_PROVIDER_SITE_OTHER): Payer: Medicare Other | Admitting: Internal Medicine

## 2014-11-02 ENCOUNTER — Encounter: Payer: Self-pay | Admitting: Internal Medicine

## 2014-11-02 VITALS — BP 140/76 | HR 77 | Ht 66.0 in | Wt 182.6 lb

## 2014-11-02 DIAGNOSIS — I495 Sick sinus syndrome: Secondary | ICD-10-CM | POA: Diagnosis not present

## 2014-11-02 DIAGNOSIS — I10 Essential (primary) hypertension: Secondary | ICD-10-CM

## 2014-11-02 DIAGNOSIS — I471 Supraventricular tachycardia, unspecified: Secondary | ICD-10-CM

## 2014-11-02 NOTE — Assessment & Plan Note (Signed)
Her blood pressure is mostly well controlled. She is encouraged to reduce her salt intake. Additional medication recommendations will be made based on the results of her echo and stress test.

## 2014-11-02 NOTE — Assessment & Plan Note (Signed)
The patient has sinus node dysfunction which appears improved with discontinuation of her beta blocker. She will undergo watchful waiting. There is still a chance that she will end up requiring permanent pacemaker insertion if she has recurrent symptoms off of her AV nodal and sinus nodal blocking drugs.

## 2014-11-02 NOTE — Assessment & Plan Note (Signed)
She has worn a cardiac monitor which demonstrates no recurrent SVT. She will undergo watchful waiting. She is now off of her beta blocker.

## 2014-11-02 NOTE — Patient Instructions (Signed)
Medication Instructions:  Your physician recommends that you continue on your current medications as directed. Please refer to the Current Medication list given to you today.  Labwork: None ordered  Testing/Procedures: None ordered  Follow-Up: No follow up is needed at this time with Dr. Taylor.  He will see you on an as needed basis.   Thank you for choosing [facility]!!          

## 2014-11-02 NOTE — Progress Notes (Signed)
HPI Maria [name] returns today for followup. She is a pleasant 74 yo woman with refractory SVT who underwent catheter ablation of a parahisian atrial tachycardia which was successfully ablated from the non-coronary cusp of the aortic root. Since ablation she has done well until a few weeks ago when she began to experience dizziness as well as shortness of breath and chest heaviness. She was seen in our clinic and wore a cardiac monitor which demonstrated pauses of approximately 4 seconds. Her beta blocker was discontinued and the pauses have resolved. She is still experiencing some shortness of breath and chest heaviness. She is pending evaluation with a 2-D echo and a stress test and follow-up by Dr. [name]. She has no rest symptoms. Allergies  Allergen Reactions  . Acetaminophen     REACTION: jittery with plain tylenol, but tolerated tylenol/benadryl combination  . Carvedilol     REACTION: nightmares  . Crestor [Rosuvastatin]     aches  . Ezetimibe-Simvastatin     REACTION: muscle aches  . Ibuprofen     REACTION: GI upset  . Insulin Glargine     Rash  . Levemir [Insulin Detemir]     rash  . Macrobid [Nitrofurantoin Macrocrystal] Other (See Comments)    Achey, N & V  . Metformin     REACTION: GI upset  . Metoprolol Tartrate     REACTION: nightmares  . Naproxen     REACTION: GI upset     Current Outpatient Prescriptions  Medication Sig Dispense Refill  . aspirin EC 81 MG tablet Take 1 tablet (81 mg total) by mouth daily. 90 tablet 3  . celecoxib (CELEBREX) 200 MG capsule TAKE 1 CAPSULE BY MOUTH DAILY AS NEEDED. 30 capsule 2  . glyBURIDE (DIABETA) 5 MG tablet Take 1 tablet by mouth in AM and 0.5-1 tablet by mouth in PM 180 tablet 3  . losartan (COZAAR) 50 MG tablet Take 1 tablet (50 mg total) by mouth every evening. 90 tablet 3  . omeprazole (PRILOSEC) 40 MG capsule Take 1 capsule by mouth daily as needed. Heartburn or acid reflux    . traMADol (ULTRAM) 50 MG tablet Take 1  tablet (50 mg total) by mouth every 6 (six) hours as needed. (Patient taking differently: Take 50 mg by mouth every 6 (six) hours as needed (pain). ) 100 tablet 5   No current facility-administered medications for this visit.     Past Medical History  Diagnosis Date  . Hypertension 1987  . Hyperlipidemia 1990  . GERD (gastroesophageal reflux disease)   . Diabetes mellitus 2002    Type II  . Depression 1988  . Anxiety 1988  . Anemia 05/1985  . Right knee pain 02/2010    Injected - Dr. Shelle Iron  . Renal cyst 07/10/2006    bilateral  . DDD (degenerative disc disease), lumbar   . DDD (degenerative disc disease), cervical   . Goiter     multinodular  . Reflux esophagitis   . PSVT (paroxysmal supraventricular tachycardia)   . Syncope   . Fracture of lower leg 07/2001    Right    ROS:   All systems reviewed and negative except as noted in the HPI.   Past Surgical History  Procedure Laterality Date  . Esophagogastroduodenoscopy  08/14/2005    gastropathy biopsy, negative  . Arthrodesis  10/03/2005    C4/5; C5/6 surgical decompression  . Cholecystectomy  08/1992  . Partial hysterectomy  1968    spotting  .  Breast lumpectomy  1967    multiple B9  . Laminectomy  09/2001    due to herniated disc  . Ablation of dysrhythmic focus  07/06/2014    dr Iver Miklas  . Supraventricular tachycardia ablation N/A 07/06/2014    Procedure: SUPRAVENTRICULAR TACHYCARDIA ABLATION;  Surgeon: Marinus Maw, MD;  Location: [facility] CATH LAB;  Service: Cardiovascular;  Laterality: N/A;     Family History  Problem Relation Age of Onset  . Hypertension Mother   . Heart disease Mother   . Diabetes Sister   . Myasthenia gravis Sister   . Diabetes Brother     1/2 juvenile; foot ulcer; periph. neuropathy  . Depression Paternal Aunt   . Hypertension Maternal Grandmother   . Diabetes Maternal Grandmother   . Stroke Maternal Grandmother   . Heart disease Maternal Grandfather     MI  . Hypertension  Maternal Grandfather   . Hypertension Paternal Grandmother   . Diabetes Paternal Grandmother   . Hypertension Paternal Grandfather   . Diabetes Paternal Grandfather      History   Social History  . Marital Status: Married    Spouse Name: N/A  . Number of Children: 2  . Years of Education: N/A   Occupational History  . Retired; Hotel manager, [employer]     Works a couple days a week   Social History Main Topics  . Smoking status: Never Smoker   . Smokeless tobacco: Never Used  . Alcohol Use: No  . Drug Use: No  . Sexual Activity: Not on file   Other Topics Concern  . Not on file   Social History Narrative   Widowed 2011 after 52 years of marriage (husband had C.diff)   Remarried 2015     BP 140/76 mmHg  Pulse 77  Ht  (1.676 m)  Wt 182 lb 9.6 oz (82.827 kg)  BMI 29.49 kg/m2  SpO2 97%  Physical Exam:  Well appearing 74 year old woman, NAD HEENT: Unremarkable Neck:  6 cm JVD, no thyromegally Back:  No CVA tenderness Lungs:  Clear with no wheezes, rales, or rhonchi HEART:  Regular rate rhythm, no murmurs, no rubs, no clicks Abd:  soft, positive bowel sounds, no organomegally, no rebound, no guarding Ext:  2 plus pulses, no edema, no cyanosis, no clubbing Skin:  No rashes no nodules Neuro:  CN II through XII intact, motor grossly intact    Assess/Plan:

## 2014-11-03 ENCOUNTER — Encounter: Payer: Self-pay | Admitting: *Deleted

## 2014-11-03 ENCOUNTER — Ambulatory Visit (HOSPITAL_COMMUNITY): Payer: Medicare Other | Attending: Cardiology

## 2014-11-03 ENCOUNTER — Other Ambulatory Visit: Payer: Self-pay

## 2014-11-03 DIAGNOSIS — R0600 Dyspnea, unspecified: Secondary | ICD-10-CM | POA: Diagnosis not present

## 2014-11-03 DIAGNOSIS — I10 Essential (primary) hypertension: Secondary | ICD-10-CM

## 2014-11-03 DIAGNOSIS — I471 Supraventricular tachycardia: Secondary | ICD-10-CM | POA: Diagnosis not present

## 2014-11-03 DIAGNOSIS — I517 Cardiomegaly: Secondary | ICD-10-CM | POA: Insufficient documentation

## 2014-11-03 DIAGNOSIS — R0789 Other chest pain: Secondary | ICD-10-CM

## 2014-11-09 ENCOUNTER — Telehealth (HOSPITAL_COMMUNITY): Payer: Self-pay | Admitting: *Deleted

## 2014-11-09 NOTE — Telephone Encounter (Signed)
Patient given detailed instructions per Myocardial Perfusion Study Information Sheet for test on 11/11/14 at 7 am. Patient Notified to arrive 15 minutes early, and that it is imperative to arrive on time for appointment to keep from having the test rescheduled. Patient verbalized understanding. Antionette Char, RN

## 2014-11-11 ENCOUNTER — Ambulatory Visit (HOSPITAL_COMMUNITY): Payer: Medicare Other | Attending: Cardiovascular Disease

## 2014-11-11 ENCOUNTER — Telehealth: Payer: Self-pay | Admitting: Cardiovascular Disease

## 2014-11-11 DIAGNOSIS — I471 Supraventricular tachycardia: Secondary | ICD-10-CM | POA: Diagnosis not present

## 2014-11-11 DIAGNOSIS — I10 Essential (primary) hypertension: Secondary | ICD-10-CM | POA: Insufficient documentation

## 2014-11-11 DIAGNOSIS — R9439 Abnormal result of other cardiovascular function study: Secondary | ICD-10-CM | POA: Diagnosis not present

## 2014-11-11 DIAGNOSIS — R0789 Other chest pain: Secondary | ICD-10-CM | POA: Insufficient documentation

## 2014-11-11 DIAGNOSIS — R0600 Dyspnea, unspecified: Secondary | ICD-10-CM | POA: Insufficient documentation

## 2014-11-11 DIAGNOSIS — E119 Type 2 diabetes mellitus without complications: Secondary | ICD-10-CM | POA: Diagnosis not present

## 2014-11-11 LAB — MYOCARDIAL PERFUSION IMAGING
Estimated workload: 4.6 METS
Exercise duration (min): 2 min
LV dias vol: 47 mL
LV sys vol: 10 mL
MPHR: 145 {beats}/min
Peak HR: 133 {beats}/min
Percent HR: 91 %
RATE: 0.28
Rest HR: 72 {beats}/min
SDS: 3
SRS: 2
SSS: 5
TID: 0.85

## 2014-11-11 MED ORDER — TECHNETIUM TC 99M SESTAMIBI GENERIC - CARDIOLITE
10.3000 | Freq: Once | INTRAVENOUS | Status: AC | PRN
  Administered 2014-11-11: 10 via INTRAVENOUS

## 2014-11-11 MED ORDER — TECHNETIUM TC 99M SESTAMIBI GENERIC - CARDIOLITE
32.7000 | Freq: Once | INTRAVENOUS | Status: AC | PRN
  Administered 2014-11-11: 32.7 via INTRAVENOUS

## 2014-11-11 MED ORDER — REGADENOSON 0.4 MG/5ML IV SOLN
0.4000 mg | Freq: Once | INTRAVENOUS | Status: AC
  Administered 2014-11-11: 0.4 mg via INTRAVENOUS

## 2014-11-11 NOTE — Telephone Encounter (Signed)
New Message  Pt called states that she is returning a call.

## 2014-11-11 NOTE — Telephone Encounter (Signed)
Patient called for Stress test results. Per Norma FredricksonLori Gerhardt NP, stress test is abnormal. Lawson FiscalLori would like to see you back to discuss. Will need cardiac cath. Can She come Friday? Patient is available on Friday and is scheduled for 8:00 am. Patient wanted to make sure that Dr. Eden EmmsNishan would be doing her Cardiac cath. Will forward to Norma FredricksonLori Gerhardt NP so she is aware.

## 2014-11-13 ENCOUNTER — Ambulatory Visit (INDEPENDENT_AMBULATORY_CARE_PROVIDER_SITE_OTHER): Payer: Medicare Other | Admitting: Nurse Practitioner

## 2014-11-13 ENCOUNTER — Other Ambulatory Visit: Payer: Self-pay | Admitting: Nurse Practitioner

## 2014-11-13 ENCOUNTER — Encounter: Payer: Self-pay | Admitting: Nurse Practitioner

## 2014-11-13 ENCOUNTER — Ambulatory Visit
Admission: RE | Admit: 2014-11-13 | Discharge: 2014-11-13 | Disposition: A | Discharge status: Home / Self Care | Payer: Medicare Other | Source: Ambulatory Visit | Attending: Nurse Practitioner | Admitting: Nurse Practitioner

## 2014-11-13 VITALS — BP 128/72 | HR 88 | Ht 66.0 in | Wt 179.0 lb

## 2014-11-13 DIAGNOSIS — R0789 Other chest pain: Secondary | ICD-10-CM

## 2014-11-13 DIAGNOSIS — R9439 Abnormal result of other cardiovascular function study: Secondary | ICD-10-CM

## 2014-11-13 DIAGNOSIS — I471 Supraventricular tachycardia: Secondary | ICD-10-CM | POA: Diagnosis not present

## 2014-11-13 LAB — BASIC METABOLIC PANEL
BUN: 17 mg/dL (ref 6–23)
CO2: 26 mEq/L (ref 19–32)
Calcium: 9.8 mg/dL (ref 8.4–10.5)
Chloride: 105 mEq/L (ref 96–112)
Creatinine, Ser: 1.07 mg/dL (ref 0.40–1.20)
GFR: 53.09 mL/min — ABNORMAL LOW (ref >60.00)
Glucose, Bld: 153 mg/dL — ABNORMAL HIGH (ref 70–99)
Potassium: 4.5 mEq/L (ref 3.5–5.1)
Sodium: 139 mEq/L (ref 135–145)

## 2014-11-13 LAB — APTT: aPTT: 29 s (ref 23.4–32.7)

## 2014-11-13 LAB — CBC
HCT: 38.9 % (ref 36.0–46.0)
Hemoglobin: 13.1 g/dL (ref 12.0–15.0)
MCHC: 33.5 g/dL (ref 30.0–36.0)
MCV: 90.5 fl (ref 78.0–100.0)
Platelets: 386 10*3/uL (ref 150.0–400.0)
RBC: 4.3 Mil/uL (ref 3.87–5.11)
RDW: 13.4 % (ref 11.5–15.5)
WBC: 6.8 10*3/uL (ref 4.0–10.5)

## 2014-11-13 LAB — PROTIME-INR
INR: 1 ratio (ref 0.8–1.0)
Prothrombin Time: 11.1 s (ref 9.6–13.1)

## 2014-11-13 MED ORDER — NITROGLYCERIN 0.4 MG SL SUBL: 0.4000 mg | SUBLINGUAL_TABLET | SUBLINGUAL | Status: DC | PRN

## 2014-11-13 NOTE — Patient Instructions (Addendum)
We will be checking the following labs today - BMET, CBC, PT, PTT  Please go to [facility] to [facility] on the first floor for a chest Xray - you may walk in.    Medication Instructions:    Continue with your current medicines.  I have sent in a RX for NTG  - this is at your drug store - Use your NTG under your tongue for recurrent chest pain. May take one tablet every 5 minutes. If you are still having discomfort after 3 tablets in 15 minutes, call 911.    Testing/Procedures To Be Arranged:  Cardiac catheterization    Other Special Instructions:   Limit activities   Your provider has recommended a cardiac catherization  You are scheduled for a cardiac catheterization on Wednesday, July 6th at 10 am with Dr. [place] or associate.  Go to [facility] 2nd Floor Short Stay on Wednesday, July 6th at 8am.  Enter thru the [employer] entrance A No food or drink after midnight on Tuesday. You may take your medications with a sip of water on the day of your procedure.   Coronary Angiogram A coronary angiogram, also called coronary angiography, is an X-ray procedure used to look at the arteries in the heart. In this procedure, a dye (contrast dye) is injected through a long, hollow tube (catheter). The catheter is about the size of a piece of cooked spaghetti and is inserted through your groin, wrist, or arm. The dye is injected into each artery, and X-rays are then taken to show if there is a blockage in the arteries of your heart.  LET [facility] CARE PROVIDER KNOW ABOUT:  Any allergies you have, including allergies to shellfish or contrast dye.   All medicines you are taking, including vitamins, herbs, eye drops, creams, and over-the-counter medicines.   Previous problems you or members of your family have had with the use of anesthetics.   Any blood disorders you have.   Previous surgeries you have had.  History of kidney problems or failure.   Other  medical conditions you have.  RISKS AND COMPLICATIONS  Generally, a coronary angiogram is a safe procedure. However, about 1 person out of 1000 can have problems that may include:  Allergic reaction to the dye.  Bleeding/bruising from the access site or other locations.  Kidney injury, especially in people with impaired kidney function.  Stroke (rare).  Heart attack (rare).  Irregular rhythms (rare)  Death (rare)  BEFORE THE PROCEDURE   Do not eat or drink anything after midnight the night before the procedure or as directed by your health care provider.   Ask your health care provider about changing or stopping your regular medicines. This is especially important if you are taking diabetes medicines or blood thinners.  PROCEDURE  You may be given a medicine to help you relax (sedative) before the procedure. This medicine is given through an intravenous (IV) access tube that is inserted into one of your veins.   The area where the catheter will be inserted will be washed and shaved. This is usually done in the groin but may be done in the fold of your arm (near your elbow) or in the wrist.   A medicine will be given to numb the area where the catheter will be inserted (local anesthetic).   The health care provider will insert the catheter into an artery. The catheter will be guided by using a special type of X-ray (fluoroscopy) of the  blood vessel being examined.   A special dye will then be injected into the catheter, and X-rays will be taken. The dye will help to show where any narrowing or blockages are located in the heart arteries.    AFTER THE PROCEDURE   If the procedure is done through the leg, you will be kept in bed lying flat for several hours. You will be instructed to not bend or cross your legs.  The insertion site will be checked frequently.   The pulse in your feet or wrist will be checked frequently.   Additional blood tests, X-rays, and an  electrocardiogram may be done.     Call the [facility] office at [phone] if you have any questions, problems or concerns.

## 2014-11-13 NOTE — Progress Notes (Signed)
   CARDIOLOGY OFFICE NOTE  Date:  11/13/2014    Maria [name] Date of Birth: 02/11/1941 Medical Record #[id number]  PCP:  Graham Duncan, MD  Cardiologist:  [name]    Chief Complaint  Patient presents with  . Chest Pain    Pre cath visit after abnormal Myoview - seen for Dr. [name]    History of Present Illness: Maria [name] is a 74 y.o. female who presents today for a pre cath visit. Seen for Dr. [name] and Dr. Taylor.   She has no known CAD. Has SVT and has had recent ablation per Dr. Taylor back in February of 2016. Other issues include HTN, HLD, GERD and DM. Reportedly with Myoview from 2008.  Echo from 2008 with normal LV function.  She has had refractory SVT and underwent catheter ablation of a parahisian atrial tachycardia which was successfully ablated from the non-coronary cusp of the aortic root. Since ablation she has done well until a few weeks ago when she began to experience dizziness as well as shortness of breath and chest heaviness. Event monitor was placed. I saw her back for the chest pain. Beta blocker stopped due to pauses. I got her Myoview and echo updated - see below.   Comes in today. Here with her husband today.She continues to have chest pain with exertion - described as a tightness. Relieved with rest. Stress test reviewed with her.   Past Medical History  Diagnosis Date  . Hypertension 1987  . Hyperlipidemia 1990  . GERD (gastroesophageal reflux disease)   . Diabetes mellitus 2002    Type II  . Depression 1988  . Anxiety 1988  . Anemia 05/1985  . Right knee pain 02/2010    Injected - Dr. Beane  . Renal cyst 07/10/2006    bilateral  . DDD (degenerative disc disease), lumbar   . DDD (degenerative disc disease), cervical   . Goiter     multinodular  . Reflux esophagitis   . PSVT (paroxysmal supraventricular tachycardia)   . Syncope   . Fracture of lower leg 07/2001    Right    Past Surgical History  Procedure Laterality Date  .  Esophagogastroduodenoscopy  08/14/2005    gastropathy biopsy, negative  . Arthrodesis  10/03/2005    C4/5; C5/6 surgical decompression  . Cholecystectomy  08/1992  . Partial hysterectomy  1968    spotting  . Breast lumpectomy  1967    multiple B9  . Laminectomy  09/2001    due to herniated disc  . Ablation of dysrhythmic focus  07/06/2014    dr taylor  . Supraventricular tachycardia ablation N/A 07/06/2014    Procedure: SUPRAVENTRICULAR TACHYCARDIA ABLATION;  Surgeon: Gregg W Taylor, MD;  Location: [facility] CATH LAB;  Service: Cardiovascular;  Laterality: N/A;     Medications: Current Outpatient Prescriptions  Medication Sig Dispense Refill  . aspirin EC 81 MG tablet Take 1 tablet (81 mg total) by mouth daily. 90 tablet 3  . celecoxib (CELEBREX) 200 MG capsule TAKE 1 CAPSULE BY MOUTH DAILY AS NEEDED. 30 capsule 2  . glyBURIDE (DIABETA) 5 MG tablet Take 1 tablet by mouth in AM and 0.5-1 tablet by mouth in PM 180 tablet 3  . losartan (COZAAR) 50 MG tablet Take 1 tablet (50 mg total) by mouth every evening. 90 tablet 3  . omeprazole (PRILOSEC) 40 MG capsule Take 1 capsule by mouth daily as needed. Heartburn or acid reflux    . traMADol (ULTRAM) 50 MG tablet   Take 1 tablet (50 mg total) by mouth every 6 (six) hours as needed. (Patient taking differently: Take 50 mg by mouth every 6 (six) hours as needed (pain). ) 100 tablet 5  . nitroGLYCERIN (NITROSTAT) 0.4 MG SL tablet Place 1 tablet (0.4 mg total) under the tongue every 5 (five) minutes as needed for chest pain. 25 tablet 3   No current facility-administered medications for this visit.    Allergies: Allergies  Allergen Reactions  . Acetaminophen     REACTION: jittery with plain tylenol, but tolerated tylenol/benadryl combination  . Carvedilol     REACTION: nightmares  . Crestor [Rosuvastatin]     aches  . Ezetimibe-Simvastatin     REACTION: muscle aches  . Ibuprofen     REACTION: GI upset  . Insulin Glargine     Rash  . Levemir  [Insulin Detemir]     rash  . Macrobid [Nitrofurantoin Macrocrystal] Other (See Comments)    Achey, N & V  . Metformin     REACTION: GI upset  . Metoprolol Tartrate     REACTION: nightmares  . Naproxen     REACTION: GI upset    Social History: The patient  reports that she has never smoked. She has never used smokeless tobacco. She reports that she does not drink alcohol or use illicit drugs.   Family History: The patient's family history includes Depression in her paternal aunt; Diabetes in her brother, maternal grandmother, paternal grandfather, paternal grandmother, and sister; Heart disease in her maternal grandfather and mother; Hypertension in her maternal grandfather, maternal grandmother, mother, paternal grandfather, and paternal grandmother; Myasthenia gravis in her sister; Stroke in her maternal grandmother.   Review of Systems: Please see the history of present illness.   Otherwise, the review of systems is positive for none.   All other systems are reviewed and negative.   Physical Exam: VS:  BP 128/72 mmHg  Pulse 88  Ht 5' 6" (1.676 m)  Wt 179 lb (81.194 kg)  BMI 28.91 kg/m2 .  BMI Body mass index is 28.91 kg/(m^2).  Wt Readings from Last 3 Encounters:  11/13/14 179 lb (81.194 kg)  11/11/14 180 lb (81.647 kg)  11/02/14 182 lb 9.6 oz (82.827 kg)    General: Pleasant. Well developed, well nourished and in no acute distress. She is obese.  HEENT: Normal. Neck: Supple, no JVD, carotid bruits, or masses noted.  Cardiac: Regular rate and rhythm. No murmurs, rubs, or gallops. No edema.  Respiratory:  Lungs are clear to auscultation bilaterally with normal work of breathing.  GI: Soft and nontender.  MS: No deformity or atrophy. Gait and ROM intact. Skin: Warm and dry. Color is normal.  Neuro:  Strength and sensation are intact and no gross focal deficits noted.  Psych: Alert, appropriate and with normal affect.   LABORATORY DATA:  EKG:  EKG is not ordered today.    Lab Results  Component Value Date   WBC 7.5 10/27/2014   HGB 13.2 10/27/2014   HCT 39.8 10/27/2014   PLT 400.0 10/27/2014   GLUCOSE 182* 10/27/2014   CHOL 183 12/18/2013   TRIG 64.0 12/18/2013   HDL 39.90 12/18/2013   LDLDIRECT 168.1 02/18/2010   LDLCALC 130* 12/18/2013   ALT 15 07/31/2013   AST 17 07/31/2013   NA 137 10/27/2014   K 4.0 10/27/2014   CL 104 10/27/2014   CREATININE 0.90 10/27/2014   BUN 17 10/27/2014   CO2 29 10/27/2014   TSH 1.76 07/31/2013     HGBA1C 6.3 07/27/2014   MICROALBUR 0.5 02/16/2009    BNP (last 3 results) No results for input(s): BNP in the last 8760 hours.  ProBNP (last 3 results)  Recent Labs  10/27/14 1455  PROBNP 37.0     Other Studies Reviewed Today:  Interpretation Summary     Nuclear stress EF: 78%.  Upsloping ST segment depression ST segment depression of 1 mm was noted during stress in the inferolateral leads (II, III, aVF, V5 and V6), and returning to baseline after less than 1 minute of recovery.  Defect 1: There is a small defect of mild severity present in the apical anterior location.  Defect 2: There is a medium defect of mild severity present in the basal inferolateral, mid inferolateral and apical lateral location.  Findings consistent with ischemia.  This is an intermediate risk study.  The left ventricular ejection fraction is normal (55-65%).  There are two defects:  1. A small, mild reversible defect in the distal anterior wall. 2. A medium size, mild partially reversible defect in the basal and mid inferolateral and apical lateral walls, this might be partially affected by an extracardiac activity present mostly on resting images.   These findings are consistent with ischemia in two different regions, overall SDS is 4.    Echo Study Conclusions from 2016  - Left ventricle: The cavity size was normal. Wall thickness was increased in a pattern of mild LVH. Systolic function was normal. The  estimated ejection fraction was in the range of 60% to 65%. Wall motion was normal; there were no regional wall motion abnormalities. Doppler parameters are consistent with abnormal left ventricular relaxation (grade 1 diastolic dysfunction). - Aortic valve: There was no stenosis. - Mitral valve: There was trivial regurgitation. - Right ventricle: The cavity size was normal. Systolic function was normal. - Tricuspid valve: Peak RV-RA gradient (S): 23 mm Hg. - Pulmonary arteries: PA peak pressure: 26 mm Hg (S). - Inferior vena cava: The vessel was normal in size. The respirophasic diameter changes were in the normal range (>= 50%), consistent with normal central venous pressure.  Impressions:  - Normal LV size with mild LV hypertrophy, EF 60-65%. Normal RV size and systolic function. No significant valvular abnormalities      Assessment/Plan: 1. Chest pain/dyspnea - abnormal Myoview - proceeding on with cardiac cath/possible PCI. The patient understands that risks include but are not limited to stroke (1 in 1000), death (1 in 1000), kidney failure [usually temporary] (1 in 500), bleeding (1 in 200), allergic reaction [possibly serious] (1 in 200), and agrees to proceed. Check lab today. Sending in RX for NTG. To limit activities   2. Multiple CV risk factors - HTN, HLD, DM  3. Recent ablation from February for SVT - now off of beta blocker due to pauses. She will undergo watchful waiting. There is still a chance that she will end up requiring permanent pacemaker insertion if she has recurrent symptoms off of her AV nodal and sinus nodal blocking drugs.  4. HTN - stable on current regimen.        Current medicines are reviewed with the patient today.  The patient does not have concerns regarding medicines other than what has been noted above.  The following changes have been made:  See above.  Labs/ tests ordered today include:    Orders Placed This Encounter    Procedures  . DG Chest 2 View  . Basic metabolic panel  . CBC  . Protime-INR  .   APTT     Disposition:   Further disposition to follow.    Patient is agreeable to this plan and will call if any problems develop in the interim.   Signed: Hines Kloss C. Joelle Flessner, RN, ANP-C 11/13/2014 8:30 AM  [facility] [address] [place], [place]  [address] Phone: [phone] Fax: [phone]         

## 2014-11-18 ENCOUNTER — Other Ambulatory Visit: Payer: Self-pay | Admitting: Cardiology

## 2014-11-18 ENCOUNTER — Encounter (HOSPITAL_COMMUNITY)
Admission: RE | Disposition: A | Discharge status: Home / Self Care | Payer: Medicare Other | Source: Ambulatory Visit | Attending: Cardiology

## 2014-11-18 ENCOUNTER — Ambulatory Visit (HOSPITAL_COMMUNITY)
Admission: RE | Admit: 2014-11-18 | Discharge: 2014-11-18 | Disposition: A | Discharge status: Home / Self Care | Payer: Medicare Other | Source: Ambulatory Visit | Attending: Cardiology | Admitting: Cardiology

## 2014-11-18 DIAGNOSIS — I2510 Atherosclerotic heart disease of native coronary artery without angina pectoris: Secondary | ICD-10-CM | POA: Diagnosis not present

## 2014-11-18 DIAGNOSIS — M5030 Other cervical disc degeneration, unspecified cervical region: Secondary | ICD-10-CM | POA: Diagnosis not present

## 2014-11-18 DIAGNOSIS — E785 Hyperlipidemia, unspecified: Secondary | ICD-10-CM | POA: Insufficient documentation

## 2014-11-18 DIAGNOSIS — M5136 Other intervertebral disc degeneration, lumbar region: Secondary | ICD-10-CM | POA: Diagnosis not present

## 2014-11-18 DIAGNOSIS — F329 Major depressive disorder, single episode, unspecified: Secondary | ICD-10-CM | POA: Insufficient documentation

## 2014-11-18 DIAGNOSIS — R9439 Abnormal result of other cardiovascular function study: Secondary | ICD-10-CM | POA: Diagnosis present

## 2014-11-18 DIAGNOSIS — Z981 Arthrodesis status: Secondary | ICD-10-CM | POA: Insufficient documentation

## 2014-11-18 DIAGNOSIS — Z7982 Long term (current) use of aspirin: Secondary | ICD-10-CM | POA: Insufficient documentation

## 2014-11-18 DIAGNOSIS — E042 Nontoxic multinodular goiter: Secondary | ICD-10-CM | POA: Insufficient documentation

## 2014-11-18 DIAGNOSIS — I10 Essential (primary) hypertension: Secondary | ICD-10-CM | POA: Insufficient documentation

## 2014-11-18 DIAGNOSIS — I25119 Atherosclerotic heart disease of native coronary artery with unspecified angina pectoris: Secondary | ICD-10-CM | POA: Diagnosis not present

## 2014-11-18 DIAGNOSIS — R079 Chest pain, unspecified: Secondary | ICD-10-CM | POA: Diagnosis present

## 2014-11-18 DIAGNOSIS — K210 Gastro-esophageal reflux disease with esophagitis: Secondary | ICD-10-CM | POA: Diagnosis not present

## 2014-11-18 DIAGNOSIS — I471 Supraventricular tachycardia: Secondary | ICD-10-CM | POA: Diagnosis present

## 2014-11-18 DIAGNOSIS — F419 Anxiety disorder, unspecified: Secondary | ICD-10-CM | POA: Diagnosis not present

## 2014-11-18 DIAGNOSIS — E119 Type 2 diabetes mellitus without complications: Secondary | ICD-10-CM | POA: Insufficient documentation

## 2014-11-18 HISTORY — PX: CARDIAC CATHETERIZATION: SHX172

## 2014-11-18 LAB — GLUCOSE, CAPILLARY: Glucose-Capillary: 117 mg/dL — ABNORMAL HIGH (ref 65–99)

## 2014-11-18 SURGERY — LEFT HEART CATH AND CORONARY ANGIOGRAPHY
Anesthesia: LOCAL

## 2014-11-18 MED ORDER — SODIUM CHLORIDE 0.9 % IV SOLN: 250.0000 mL | INTRAVENOUS | Status: DC | PRN

## 2014-11-18 MED ORDER — VERAPAMIL HCL 2.5 MG/ML IV SOLN
INTRAVENOUS | Status: DC | PRN
  Administered 2014-11-18: 10:00:00 via INTRA_ARTERIAL

## 2014-11-18 MED ORDER — LIDOCAINE HCL (PF) 1 % IJ SOLN
INTRAMUSCULAR | Status: AC
  Filled 2014-11-18: qty 30

## 2014-11-18 MED ORDER — NITROGLYCERIN 1 MG/10 ML FOR IR/CATH LAB
INTRA_ARTERIAL | Status: AC
  Filled 2014-11-18: qty 10

## 2014-11-18 MED ORDER — VERAPAMIL HCL 2.5 MG/ML IV SOLN
INTRAVENOUS | Status: AC
  Filled 2014-11-18: qty 2

## 2014-11-18 MED ORDER — SODIUM CHLORIDE 0.9 % IV SOLN: INTRAVENOUS | Status: DC

## 2014-11-18 MED ORDER — SODIUM CHLORIDE 0.9 % IJ SOLN: 3.0000 mL | INTRAMUSCULAR | Status: DC | PRN

## 2014-11-18 MED ORDER — SODIUM CHLORIDE 0.9 % WEIGHT BASED INFUSION: 3.0000 mL/kg/h | INTRAVENOUS | Status: DC

## 2014-11-18 MED ORDER — IOHEXOL 350 MG/ML SOLN
INTRAVENOUS | Status: DC | PRN
  Administered 2014-11-18: 75 mL via INTRA_ARTERIAL

## 2014-11-18 MED ORDER — DIAZEPAM 5 MG PO TABS
ORAL_TABLET | ORAL | Status: AC
  Filled 2014-11-18: qty 2

## 2014-11-18 MED ORDER — DIAZEPAM 5 MG PO TABS
10.0000 mg | ORAL_TABLET | ORAL | Status: AC
  Administered 2014-11-18: 10 mg via ORAL

## 2014-11-18 MED ORDER — ASPIRIN 81 MG PO CHEW
81.0000 mg | CHEWABLE_TABLET | ORAL | Status: AC
  Administered 2014-11-18: 81 mg via ORAL

## 2014-11-18 MED ORDER — ASPIRIN 81 MG PO CHEW
CHEWABLE_TABLET | ORAL | Status: AC
  Filled 2014-11-18: qty 1

## 2014-11-18 MED ORDER — SODIUM CHLORIDE 0.9 % IJ SOLN: 3.0000 mL | Freq: Two times a day (BID) | INTRAMUSCULAR | Status: DC

## 2014-11-18 MED ORDER — HEPARIN SODIUM (PORCINE) 1000 UNIT/ML IJ SOLN
INTRAMUSCULAR | Status: DC | PRN
  Administered 2014-11-18: 4000 [IU] via INTRAVENOUS

## 2014-11-18 SURGICAL SUPPLY — 14 items
CATH INFINITI 5 FR JL3.5 (CATHETERS) ×2 IMPLANT
CATH INFINITI 5FR ANG PIGTAIL (CATHETERS) ×2 IMPLANT
CATH INFINITI JR4 5F (CATHETERS) ×2 IMPLANT
DEVICE RAD COMP TR BAND LRG (VASCULAR PRODUCTS) ×2 IMPLANT
GLIDESHEATH SLEND SS 6F .021 (SHEATH) ×2 IMPLANT
KIT ENCORE 26 ADVANTAGE (KITS) IMPLANT
KIT HEART LEFT (KITS) ×2 IMPLANT
PACK CARDIAC CATHETERIZATION (CUSTOM PROCEDURE TRAY) ×2 IMPLANT
SHEATH PINNACLE 6F 10CM (SHEATH) IMPLANT
SYR MEDRAD MARK V 150ML (SYRINGE) ×2 IMPLANT
TRANSDUCER W/STOPCOCK (MISCELLANEOUS) ×2 IMPLANT
TUBING CIL FLEX 10 FLL-RA (TUBING) ×2 IMPLANT
WIRE EMERALD 3MM-J .035X150CM (WIRE) IMPLANT
WIRE SAFE-T 1.5MM-J .035X260CM (WIRE) ×2 IMPLANT

## 2014-11-18 NOTE — H&P (View-Only) (Signed)
CARDIOLOGY OFFICE NOTE  Date:  11/13/2014    [name] Date of Birth: 1940-12-14 Medical Record #[id number]  PCP:  Crawford Givens, MD  Cardiologist:  [name]    Chief Complaint  Patient presents with  . Chest Pain    Pre cath visit after abnormal Myoview - seen for Dr. [name]    History of Present Illness: FYNLEE ROWLANDS is a 74 y.o. female who presents today for a pre cath visit. Seen for Dr. [name] and Dr. Ladona Ridgel.   She has no known CAD. Has SVT and has had recent ablation per Dr. Ladona Ridgel back in February of 2016. Other issues include HTN, HLD, GERD and DM. Reportedly with Myoview from 2008.  Echo from 2008 with normal LV function.  She has had refractory SVT and underwent catheter ablation of a parahisian atrial tachycardia which was successfully ablated from the non-coronary cusp of the aortic root. Since ablation she has done well until a few weeks ago when she began to experience dizziness as well as shortness of breath and chest heaviness. Event monitor was placed. I saw her back for the chest pain. Beta blocker stopped due to pauses. I got her Myoview and echo updated - see below.   Comes in today. Here with her husband today.She continues to have chest pain with exertion - described as a tightness. Relieved with rest. Stress test reviewed with her.   Past Medical History  Diagnosis Date  . Hypertension 1987  . Hyperlipidemia 1990  . GERD (gastroesophageal reflux disease)   . Diabetes mellitus 2002    Type II  . Depression 1988  . Anxiety 1988  . Anemia 05/1985  . Right knee pain 02/2010    Injected - Dr. Shelle Iron  . Renal cyst 07/10/2006    bilateral  . DDD (degenerative disc disease), lumbar   . DDD (degenerative disc disease), cervical   . Goiter     multinodular  . Reflux esophagitis   . PSVT (paroxysmal supraventricular tachycardia)   . Syncope   . Fracture of lower leg 07/2001    Right    Past Surgical History  Procedure Laterality Date  .  Esophagogastroduodenoscopy  08/14/2005    gastropathy biopsy, negative  . Arthrodesis  10/03/2005    C4/5; C5/6 surgical decompression  . Cholecystectomy  08/1992  . Partial hysterectomy  1968    spotting  . Breast lumpectomy  1967    multiple B9  . Laminectomy  09/2001    due to herniated disc  . Ablation of dysrhythmic focus  07/06/2014    dr taylor  . Supraventricular tachycardia ablation N/A 07/06/2014    Procedure: SUPRAVENTRICULAR TACHYCARDIA ABLATION;  Surgeon: Marinus Maw, MD;  Location: [facility] CATH LAB;  Service: Cardiovascular;  Laterality: N/A;     Medications: Current Outpatient Prescriptions  Medication Sig Dispense Refill  . aspirin EC 81 MG tablet Take 1 tablet (81 mg total) by mouth daily. 90 tablet 3  . celecoxib (CELEBREX) 200 MG capsule TAKE 1 CAPSULE BY MOUTH DAILY AS NEEDED. 30 capsule 2  . glyBURIDE (DIABETA) 5 MG tablet Take 1 tablet by mouth in AM and 0.5-1 tablet by mouth in PM 180 tablet 3  . losartan (COZAAR) 50 MG tablet Take 1 tablet (50 mg total) by mouth every evening. 90 tablet 3  . omeprazole (PRILOSEC) 40 MG capsule Take 1 capsule by mouth daily as needed. Heartburn or acid reflux    . traMADol (ULTRAM) 50 MG tablet  Take 1 tablet (50 mg total) by mouth every 6 (six) hours as needed. (Patient taking differently: Take 50 mg by mouth every 6 (six) hours as needed (pain). ) 100 tablet 5  . nitroGLYCERIN (NITROSTAT) 0.4 MG SL tablet Place 1 tablet (0.4 mg total) under the tongue every 5 (five) minutes as needed for chest pain. 25 tablet 3   No current facility-administered medications for this visit.    Allergies: Allergies  Allergen Reactions  . Acetaminophen     REACTION: jittery with plain tylenol, but tolerated tylenol/benadryl combination  . Carvedilol     REACTION: nightmares  . Crestor [Rosuvastatin]     aches  . Ezetimibe-Simvastatin     REACTION: muscle aches  . Ibuprofen     REACTION: GI upset  . Insulin Glargine     Rash  . Levemir  [Insulin Detemir]     rash  . Macrobid [Nitrofurantoin Macrocrystal] Other (See Comments)    Achey, N & V  . Metformin     REACTION: GI upset  . Metoprolol Tartrate     REACTION: nightmares  . Naproxen     REACTION: GI upset    Social History: The patient  reports that she has never smoked. She has never used smokeless tobacco. She reports that she does not drink alcohol or use illicit drugs.   Family History: The patient's family history includes Depression in her paternal aunt; Diabetes in her brother, maternal grandmother, paternal grandfather, paternal grandmother, and sister; Heart disease in her maternal grandfather and mother; Hypertension in her maternal grandfather, maternal grandmother, mother, paternal grandfather, and paternal grandmother; Myasthenia gravis in her sister; Stroke in her maternal grandmother.   Review of Systems: Please see the history of present illness.   Otherwise, the review of systems is positive for none.   All other systems are reviewed and negative.   Physical Exam: VS:  BP 128/72 mmHg  Pulse 88  Ht 5\' 6"  (1.676 m)  Wt 179 lb (81.194 kg)  BMI 28.91 kg/m2 .  BMI Body mass index is 28.91 kg/(m^2).  Wt Readings from Last 3 Encounters:  11/13/14 179 lb (81.194 kg)  11/11/14 180 lb (81.647 kg)  11/02/14 182 lb 9.6 oz (82.827 kg)    General: Pleasant. Well developed, well nourished and in no acute distress. She is obese.  HEENT: Normal. Neck: Supple, no JVD, carotid bruits, or masses noted.  Cardiac: Regular rate and rhythm. No murmurs, rubs, or gallops. No edema.  Respiratory:  Lungs are clear to auscultation bilaterally with normal work of breathing.  GI: Soft and nontender.  MS: No deformity or atrophy. Gait and ROM intact. Skin: Warm and dry. Color is normal.  Neuro:  Strength and sensation are intact and no gross focal deficits noted.  Psych: Alert, appropriate and with normal affect.   LABORATORY DATA:  EKG:  EKG is not ordered today.    Lab Results  Component Value Date   WBC 7.5 10/27/2014   HGB 13.2 10/27/2014   HCT 39.8 10/27/2014   PLT 400.0 10/27/2014   GLUCOSE 182* 10/27/2014   CHOL 183 12/18/2013   TRIG 64.0 12/18/2013   HDL 39.90 12/18/2013   LDLDIRECT 168.1 02/18/2010   LDLCALC 130* 12/18/2013   ALT 15 07/31/2013   AST 17 07/31/2013   NA 137 10/27/2014   K 4.0 10/27/2014   CL 104 10/27/2014   CREATININE 0.90 10/27/2014   BUN 17 10/27/2014   CO2 29 10/27/2014   TSH 1.76 07/31/2013  HGBA1C 6.3 07/27/2014   MICROALBUR 0.5 02/16/2009    BNP (last 3 results) No results for input(s): BNP in the last 8760 hours.  ProBNP (last 3 results)  Recent Labs  10/27/14 1455  PROBNP 37.0     Other Studies Reviewed Today:  Interpretation Summary     Nuclear stress EF: 78%.  Upsloping ST segment depression ST segment depression of 1 mm was noted during stress in the inferolateral leads (II, III, aVF, V5 and V6), and returning to baseline after less than 1 minute of recovery.  Defect 1: There is a small defect of mild severity present in the apical anterior location.  Defect 2: There is a medium defect of mild severity present in the basal inferolateral, mid inferolateral and apical lateral location.  Findings consistent with ischemia.  This is an intermediate risk study.  The left ventricular ejection fraction is normal (55-65%).  There are two defects:  1. A small, mild reversible defect in the distal anterior wall. 2. A medium size, mild partially reversible defect in the basal and mid inferolateral and apical lateral walls, this might be partially affected by an extracardiac activity present mostly on resting images.   These findings are consistent with ischemia in two different regions, overall SDS is 4.    Echo Study Conclusions from 2016  - Left ventricle: The cavity size was normal. Wall thickness was increased in a pattern of mild LVH. Systolic function was normal. The  estimated ejection fraction was in the range of 60% to 65%. Wall motion was normal; there were no regional wall motion abnormalities. Doppler parameters are consistent with abnormal left ventricular relaxation (grade 1 diastolic dysfunction). - Aortic valve: There was no stenosis. - Mitral valve: There was trivial regurgitation. - Right ventricle: The cavity size was normal. Systolic function was normal. - Tricuspid valve: Peak RV-RA gradient (S): 23 mm Hg. - Pulmonary arteries: PA peak pressure: 26 mm Hg (S). - Inferior vena cava: The vessel was normal in size. The respirophasic diameter changes were in the normal range (>= 50%), consistent with normal central venous pressure.  Impressions:  - Normal LV size with mild LV hypertrophy, EF 60-65%. Normal RV size and systolic function. No significant valvular abnormalities      Assessment/Plan: 1. Chest pain/dyspnea - abnormal Myoview - proceeding on with cardiac cath/possible PCI. The patient understands that risks include but are not limited to stroke (1 in 1000), death (1 in 1000), kidney failure [usually temporary] (1 in 500), bleeding (1 in 200), allergic reaction [possibly serious] (1 in 200), and agrees to proceed. Check lab today. Sending in RX for NTG. To limit activities   2. Multiple CV risk factors - HTN, HLD, DM  3. Recent ablation from February for SVT - now off of beta blocker due to pauses. She will undergo watchful waiting. There is still a chance that she will end up requiring permanent pacemaker insertion if she has recurrent symptoms off of her AV nodal and sinus nodal blocking drugs.  4. HTN - stable on current regimen.        Current medicines are reviewed with the patient today.  The patient does not have concerns regarding medicines other than what has been noted above.  The following changes have been made:  See above.  Labs/ tests ordered today include:    Orders Placed This Encounter    Procedures  . DG Chest 2 View  . Basic metabolic panel  . CBC  . Protime-INR  .  APTT     Disposition:   Further disposition to follow.    Patient is agreeable to this plan and will call if any problems develop in the interim.   Signed: Rosalio Macadamia, RN, ANP-C 11/13/2014 8:30 AM  [facility] [address] [place], [place]  [address] Phone: [phone] Fax: [phone]

## 2014-11-18 NOTE — Interval H&P Note (Signed)
History and Physical Interval Note:  11/18/2014 9:49 AM  Maria [name]  has presented today for surgery, with the diagnosis of abnormal myoview, cp  The various methods of treatment have been discussed with the patient and family. After consideration of risks, benefits and other options for treatment, the patient has consented to  Procedure(s): Left Heart Cath and Coronary Angiography (N/A) as a surgical intervention .  The patient's history has been reviewed, patient examined, no change in status, stable for surgery.  I have reviewed the patient's chart and labs.  Questions were answered to the patient's satisfaction.   Cath Lab Visit (complete for each Cath Lab visit)  Clinical Evaluation Leading to the Procedure:   ACS: No.  Non-ACS:    Anginal Classification: CCS III  Anti-ischemic medical therapy: No Therapy  Non-Invasive Test Results: High-risk stress test findings: cardiac mortality >3%/year  Prior CABG: No previous CABG        Theron Aristaeter [email or web address],FACC 11/18/2014 9:49 AM

## 2014-11-18 NOTE — Discharge Instructions (Addendum)
Radial Site Care °Refer to this sheet in the next few weeks. These instructions provide you with information on caring for yourself after your procedure. Your caregiver may also give you more specific instructions. Your treatment has been planned according to current medical practices, but problems sometimes occur. Call your caregiver if you have any problems or questions after your procedure. °HOME CARE INSTRUCTIONS °· You may shower the day after the procedure. Remove the bandage (dressing) and gently wash the site with plain soap and water. Gently pat the site dry. °· Do not apply powder or lotion to the site. °· Do not submerge the affected site in water for 3 to 5 days. °· Inspect the site at least twice daily. °· Do not flex or bend the affected arm for 24 hours. °· No lifting over 5 pounds (2.3 kg) for 5 days after your procedure. °· Do not drive home if you are discharged the same day of the procedure. Have someone else drive you. °· You may drive 24 hours after the procedure unless otherwise instructed by your caregiver. °· Do not operate machinery or power tools for 24 hours. °· A responsible adult should be with you for the first 24 hours after you arrive home. °What to expect: °· Any bruising will usually fade within 1 to 2 weeks. °· Blood that collects in the tissue (hematoma) may be painful to the touch. It should usually decrease in size and tenderness within 1 to 2 weeks. °SEEK IMMEDIATE MEDICAL CARE IF: °· You have unusual pain at the radial site. °· You have redness, warmth, swelling, or pain at the radial site. °· You have drainage (other than a small amount of blood on the dressing). °· You have chills. °· You have a fever or persistent symptoms for more than 72 hours. °· You have a fever and your symptoms suddenly get worse. °· Your arm becomes pale, cool, tingly, or numb. °· You have heavy bleeding from the site. Hold pressure on the site. CALL 911 °Document Released: 06/03/2010 Document  Revised: 07/24/2011 Document Reviewed: 06/03/2010 °Exit[employer]® Patient Information ©2015 [employer]. This information is not intended to replace advice given to you by your health care provider. Make sure you discuss any questions you have with your health care provider. ° °

## 2014-11-19 ENCOUNTER — Encounter (HOSPITAL_COMMUNITY): Payer: Self-pay | Admitting: Cardiology

## 2014-11-20 ENCOUNTER — Telehealth: Payer: Self-pay | Admitting: Cardiology

## 2014-11-20 MED FILL — Lidocaine HCl Local Preservative Free (PF) Inj 1%: INTRAMUSCULAR | Qty: 30 | Status: AC

## 2014-11-20 MED FILL — Nitroglycerin IV Soln 100 MCG/ML in D5W: INTRA_ARTERIAL | Qty: 10 | Status: AC

## 2014-11-20 NOTE — Telephone Encounter (Signed)
Please call,pt had a Cath on Wednesday. She wants to know uf she needs a follow up visit and if there are special instructions she needs to follow.

## 2014-11-20 NOTE — Telephone Encounter (Signed)
Returned call patient advised she needs to follow back up with Dr.[name].Stated cath site healing well no redness,no pain.Stated [facility] [facility] to schedule appointment with Dr.[name] was told no openings until 9/16.When looking at Dr.[name]'s schedule two hold appts 12/14/14.Advised I will send message to Dr.[name]'s nurse Wynona Caneshristine to schedule post hospital appointment.

## 2014-11-23 NOTE — Telephone Encounter (Signed)
PT  AWARE  FIRST   APPT  AVAILABLE  IS  NOT UNTIL AUGUST  29 AT  11:15  PT  WOULD LIKE  EARLER  APPT  NOTHING  AVAILABLE  BASED  ON  CATH  PER  PT  HAS  60 % BLOCKAGE  AND  ANOTHER WITH  30% WAS  SUPPOSE  TO START  ON MED  THAT IS  WHY  NEEDED APPT WITH  DR Eden EmmsNISHAN  NO MED ( IMDUR?)  HAS BEEN STARTED PER  PT HAS SOME CHEST  TIGHTNESS WITH EXERTION  DOES NOT HAPPEN EVERY DAY WILL FORWARD TO DR Eden EmmsNISHAN  FOR  REVIEW .

## 2014-11-24 NOTE — Telephone Encounter (Signed)
Follow UP  Pt returning Danielle's phone call

## 2014-11-24 NOTE — Telephone Encounter (Signed)
Left message on machine for pt to contact the office.   

## 2014-11-24 NOTE — Telephone Encounter (Signed)
Start imdur 30 mg and coreg 3.125 bid Maria FredricksonLori [name] knows her and set up cath F/U with me or her next available

## 2014-11-24 NOTE — Telephone Encounter (Signed)
Add her to a FLEX day for me in next few weeks.

## 2014-11-24 NOTE — Telephone Encounter (Signed)
Pt is agreeable to plan will come in on August 2 for ov with Debby FreibergLori Gehardt,, NP

## 2014-11-25 ENCOUNTER — Telehealth: Payer: Self-pay | Admitting: Cardiovascular Disease

## 2014-11-25 MED ORDER — ISOSORBIDE MONONITRATE ER 30 MG PO TB24: 30.0000 mg | ORAL_TABLET | Freq: Every day | ORAL | Status: DC

## 2014-11-25 MED ORDER — ATENOLOL 25 MG PO TABS: 25.0000 mg | ORAL_TABLET | Freq: Every day | ORAL | Status: DC

## 2014-11-25 NOTE — Telephone Encounter (Signed)
PT  NOTIIFED  .Zack Seal/CY

## 2014-11-25 NOTE — Telephone Encounter (Signed)
SEE OTHER PHONE NOTE./CY 

## 2014-11-25 NOTE — Addendum Note (Signed)
Addended by: Scherrie BatemanYORK, Yesica Kemler E on: 11/25/2014 04:52 PM   Modules accepted: Orders

## 2014-11-25 NOTE — Telephone Encounter (Signed)
Follow UP  Pt stated disconnected w/ Maria [name] concerning previous message. Please call back and discuss.

## 2014-11-25 NOTE — Addendum Note (Signed)
Addended by: Scherrie BatemanYORK, Shaneisha Burkel E on: 11/25/2014 01:11 PM   Modules accepted: Orders

## 2014-11-25 NOTE — Telephone Encounter (Signed)
See if she can take atenolol 25 mg

## 2014-11-25 NOTE — Telephone Encounter (Addendum)
PT  AWARE  WILL START   IMDUR   PER  PT   HAS  ALLERGY  TO CARVEDILOL (GIVES  NIGHTMARES )   WILL INFORM  DR   Eden EmmsNISHAN   AND DISCUSS    ANY FURTHER  RECOMMENDATIONS .Zack Seal/CY

## 2014-12-08 ENCOUNTER — Telehealth: Payer: Self-pay | Admitting: Nurse Practitioner

## 2014-12-08 NOTE — Telephone Encounter (Signed)
She can try to see how it goes for the next week with Imdur. If HAs unbearable, stop Imdur and start Norvasc 5 mg QD. Tereso Newcomer, PA-C   12/08/2014 5:02 PM

## 2014-12-08 NOTE — Telephone Encounter (Signed)
S/w pt feeling better except for headaches been taking advil to relieve. Stated headache should subside in a couple of weeks. Stated to start taking medication qhs to sleep through headache.  Pt has appointment on August 2 with Lawson Fiscal and will discuss further.  Pt agreeable to plan.  Will FYI Norma Fredrickson, NP.

## 2014-12-08 NOTE — Telephone Encounter (Signed)
New message     Pt c/o medication issue:  1. Name of Medication: Isosorbide  2. How are you currently taking this medication (dosage and times per day)?  1 time a day  3. Are you having a reaction (difficulty breathing--STAT)? No  4. What is your medication issue? Pt is suffering from headaches.  Pt wants to know if headaches will continue or if they will gradually get better.  Please call to discuss

## 2014-12-09 NOTE — Telephone Encounter (Signed)
Will discuss further with her at her return OV but agree we could try Norvasc if needed.

## 2014-12-15 ENCOUNTER — Encounter: Payer: Self-pay | Admitting: Nurse Practitioner

## 2014-12-15 ENCOUNTER — Ambulatory Visit (INDEPENDENT_AMBULATORY_CARE_PROVIDER_SITE_OTHER): Payer: Medicare Other | Admitting: Nurse Practitioner

## 2014-12-15 VITALS — BP 140/84 | HR 76 | Ht 66.0 in | Wt 181.1 lb

## 2014-12-15 DIAGNOSIS — R0600 Dyspnea, unspecified: Secondary | ICD-10-CM | POA: Diagnosis not present

## 2014-12-15 DIAGNOSIS — I10 Essential (primary) hypertension: Secondary | ICD-10-CM

## 2014-12-15 DIAGNOSIS — I259 Chronic ischemic heart disease, unspecified: Secondary | ICD-10-CM

## 2014-12-15 DIAGNOSIS — Z9889 Other specified postprocedural states: Secondary | ICD-10-CM | POA: Diagnosis not present

## 2014-12-15 DIAGNOSIS — E785 Hyperlipidemia, unspecified: Secondary | ICD-10-CM

## 2014-12-15 MED ORDER — AMLODIPINE BESYLATE 2.5 MG PO TABS: 2.5000 mg | ORAL_TABLET | Freq: Every day | ORAL | Status: DC

## 2014-12-15 NOTE — Progress Notes (Signed)
CARDIOLOGY OFFICE NOTE  Date:  12/15/2014    Maria [name] Date of Birth: 09/05/1940 Medical Record #[id number]  PCP:  Crawford Givens, MD  Cardiologist:  [name]    Chief Complaint  Patient presents with  . Coronary Artery Disease    Post cath visit - seen for Dr. Dalbert Mayotte    History of Present Illness: Maria [name] is a 74 y.o. female who presents today for a post cath visit. Seen for Dr. [name] and Dr. Ladona Ridgel.   She has previously had no known CAD. Has SVT and has had recent ablation per Dr. Ladona Ridgel back in February of 2016. Other issues include HTN, HLD, GERD and DM. Reportedly with Myoview from 2008. Echo from 2008 with normal LV function.  She has had refractory SVT and underwent catheter ablation of a parahisian atrial tachycardia which was successfully ablated from the non-coronary cusp of the aortic root. Since her ablation she did well until a few weeks ago when she began to experience dizziness as well as shortness of breath and chest heaviness. Event monitor was placed. I saw her back for the chest pain. Beta blocker stopped due to pauses. I got her Myoview and echo updated - see below. Myoview abnormal and she was referred on for cardiac cath - to try and manage medically - see below. Was placed on Imdur and beta blocker but did not wish to take Coreg (has allergies - nightmares).   Comes in today. Here with her husband.She notes that she is doing ok. She has still had some issues with chest tightness and dyspnea. She has not returned to all of her usual activities. She is on lower dose of her Atenolol - she has had bradycardia and pauses noted from May of 2016 when she wore her event monitor. Does not tolerate Coreg. Headache has improved with the Imdur. She feels like overall she is about 75% improved. She would like to resume her regular activities.    Past Medical History  Diagnosis Date  . Hypertension 1987  . Hyperlipidemia 1990  . GERD (gastroesophageal  reflux disease)   . Diabetes mellitus 2002    Type II  . Depression 1988  . Anxiety 1988  . Anemia 05/1985  . Right knee pain 02/2010    Injected - Dr. Shelle Iron  . Renal cyst 07/10/2006    bilateral  . DDD (degenerative disc disease), lumbar   . DDD (degenerative disc disease), cervical   . Goiter     multinodular  . Reflux esophagitis   . PSVT (paroxysmal supraventricular tachycardia)   . Syncope   . Fracture of lower leg 07/2001    Right    Past Surgical History  Procedure Laterality Date  . Esophagogastroduodenoscopy  08/14/2005    gastropathy biopsy, negative  . Arthrodesis  10/03/2005    C4/5; C5/6 surgical decompression  . Cholecystectomy  08/1992  . Partial hysterectomy  1968    spotting  . Breast lumpectomy  1967    multiple B9  . Laminectomy  09/2001    due to herniated disc  . Ablation of dysrhythmic focus  07/06/2014    dr taylor  . Supraventricular tachycardia ablation N/A 07/06/2014    Procedure: SUPRAVENTRICULAR TACHYCARDIA ABLATION;  Surgeon: Marinus Maw, MD;  Location[facility] CATH LAB;  Service: Cardiovascular;  Laterality: N/A;  . Cardiac catheterization N/A 11/18/2014    Procedure: Left Heart Cath and Coronary Angiography;  Surgeon: Peter M Swaziland, MD;  Location: [facility] INVASIVE  CV LAB;  Service: Cardiovascular;  Laterality: N/A;     Medications: Current Outpatient Prescriptions  Medication Sig Dispense Refill  . aspirin EC 81 MG tablet Take 1 tablet (81 mg total) by mouth daily. 90 tablet 3  . atenolol (TENORMIN) 25 MG tablet Take 1 tablet (25 mg total) by mouth daily. 30 tablet 6  . celecoxib (CELEBREX) 200 MG capsule TAKE 1 CAPSULE BY MOUTH DAILY AS NEEDED. 30 capsule 2  . glyBURIDE (DIABETA) 5 MG tablet Take 1 tablet by mouth in AM and 0.5-1 tablet by mouth in PM 180 tablet 3  . isosorbide mononitrate (IMDUR) 30 MG 24 hr tablet Take 1 tablet (30 mg total) by mouth daily. 90 tablet 3  . losartan (COZAAR) 50 MG tablet Take 1 tablet (50 mg total) by mouth  every evening. 90 tablet 3  . nitroGLYCERIN (NITROSTAT) 0.4 MG SL tablet Place 1 tablet (0.4 mg total) under the tongue every 5 (five) minutes as needed for chest pain. 25 tablet 3  . omeprazole (PRILOSEC) 40 MG capsule Take 1 capsule by mouth daily as needed. Heartburn or acid reflux    . traMADol (ULTRAM) 50 MG tablet Take 1 tablet (50 mg total) by mouth every 6 (six) hours as needed. (Patient taking differently: Take 50 mg by mouth every 6 (six) hours as needed (pain). ) 100 tablet 5   No current facility-administered medications for this visit.    Allergies: Allergies  Allergen Reactions  . Acetaminophen     REACTION: jittery with plain tylenol, but tolerated tylenol/benadryl combination  . Carvedilol     REACTION: nightmares  . Crestor [Rosuvastatin]     aches  . Ezetimibe-Simvastatin     REACTION: muscle aches  . Ibuprofen     REACTION: GI upset  . Insulin Glargine     Rash  . Levemir [Insulin Detemir]     rash  . Macrobid [Nitrofurantoin Macrocrystal] Other (See Comments)    Achey, N & V  . Metformin     REACTION: GI upset  . Metoprolol Tartrate     REACTION: nightmares  . Naproxen     REACTION: GI upset    Social History: The patient  reports that she has never smoked. She has never used smokeless tobacco. She reports that she does not drink alcohol or use illicit drugs.   Family History: The patient's family history includes Depression in her paternal aunt; Diabetes in her brother, maternal grandmother, paternal grandfather, paternal grandmother, and sister; Heart disease in her maternal grandfather and mother; Hypertension in her maternal grandfather, maternal grandmother, mother, paternal grandfather, and paternal grandmother; Myasthenia gravis in her sister; Stroke in her maternal grandmother.   Review of Systems: Please see the history of present illness.   Otherwise, the review of systems is positive for none.   All other systems are reviewed and negative.    Physical Exam: VS:  BP 140/84 mmHg  Pulse 76  Ht 5\' 6"  (1.676 m)  Wt 181 lb 1.9 oz (82.155 kg)  BMI 29.25 kg/m2  SpO2 96% .  BMI Body mass index is 29.25 kg/(m^2).  Wt Readings from Last 3 Encounters:  12/15/14 181 lb 1.9 oz (82.155 kg)  11/18/14 175 lb (79.379 kg)  11/13/14 179 lb (81.194 kg)    General: Pleasant. Well developed, well nourished and in no acute distress.  HEENT: Normal. Neck: Supple, no JVD, carotid bruits, or masses noted.  Cardiac: Regular rate and rhythm. No murmurs, rubs, or gallops. No edema.  Respiratory:  Lungs are clear to auscultation bilaterally with normal work of breathing.  GI: Soft and nontender.  MS: No deformity or atrophy. Gait and ROM intact. Skin: Warm and dry. Color is normal. Cath site is ok. Neuro:  Strength and sensation are intact and no gross focal deficits noted.  Psych: Alert, appropriate and with normal affect.   LABORATORY DATA:  EKG:  EKG is not ordered today.   Lab Results  Component Value Date   WBC 6.8 11/13/2014   HGB 13.1 11/13/2014   HCT 38.9 11/13/2014   PLT 386.0 11/13/2014   GLUCOSE 153* 11/13/2014   CHOL 183 12/18/2013   TRIG 64.0 12/18/2013   HDL 39.90 12/18/2013   LDLDIRECT 168.1 02/18/2010   LDLCALC 130* 12/18/2013   ALT 15 07/31/2013   AST 17 07/31/2013   NA 139 11/13/2014   K 4.5 11/13/2014   CL 105 11/13/2014   CREATININE 1.07 11/13/2014   BUN 17 11/13/2014   CO2 26 11/13/2014   TSH 1.76 07/31/2013   INR 1.0 11/13/2014   HGBA1C 6.3 07/27/2014   MICROALBUR 0.5 02/16/2009    BNP (last 3 results) No results for input(s): BNP in the last 8760 hours.  ProBNP (last 3 results)  Recent Labs  10/27/14 1455  PROBNP 37.0     Other Studies Reviewed Today:   Cardiac Cath Conclusion from 11/2014     Prox RCA lesion, 30% stenosed.  1st Diag lesion, 35% stenosed.  Prox Cx to Mid Cx lesion, 70% stenosed.  The left ventricular systolic function is normal.  1. Single vessel obstructive  CAD with moderate stenosis in the LCx prior to a large OM. 2. Normal LV function  Recommendation: Per AUC would recommend optimizing medical therapy for angina. If she has refractory anginal symptoms despite 2 antianginal medications then PCI of the LCx would be reasonable.      Interpretation Summary     Nuclear stress EF: 78%.  Upsloping ST segment depression ST segment depression of 1 mm was noted during stress in the inferolateral leads (II, III, aVF, V5 and V6), and returning to baseline after less than 1 minute of recovery.  Defect 1: There is a small defect of mild severity present in the apical anterior location.  Defect 2: There is a medium defect of mild severity present in the basal inferolateral, mid inferolateral and apical lateral location.  Findings consistent with ischemia.  This is an intermediate risk study.  The left ventricular ejection fraction is normal (55-65%).  There are two defects:  1. A small, mild reversible defect in the distal anterior wall. 2. A medium size, mild partially reversible defect in the basal and mid inferolateral and apical lateral walls, this might be partially affected by an extracardiac activity present mostly on resting images.   These findings are consistent with ischemia in two different regions, overall SDS is 4.    Echo Study Conclusions from 2016  - Left ventricle: The cavity size was normal. Wall thickness was increased in a pattern of mild LVH. Systolic function was normal. The estimated ejection fraction was in the range of 60% to 65%. Wall motion was normal; there were no regional wall motion abnormalities. Doppler parameters are consistent with abnormal left ventricular relaxation (grade 1 diastolic dysfunction). - Aortic valve: There was no stenosis. - Mitral valve: There was trivial regurgitation. - Right ventricle: The cavity size was normal. Systolic function was normal. - Tricuspid valve: Peak  RV-RA gradient (S): 23 mm Hg. -  Pulmonary arteries: PA peak pressure: 26 mm Hg (S). - Inferior vena cava: The vessel was normal in size. The respirophasic diameter changes were in the normal range (>= 50%), consistent with normal central venous pressure.  Impressions:  - Normal LV size with mild LV hypertrophy, EF 60-65%. Normal RV size and systolic function. No significant valvular abnormalities      Assessment/Plan: 1. Chest pain/dyspnea - abnormal Myoview - s/p cath and to try and manage medically. She is on low dose atenolol, low dose Imdur and still with some symptoms. I do not think we will be able to increase her beta blocker anymore due to past documentation of profound bradycardia/pauses. Will add low dose Norvasc. Reassess later this month. She is to get back to her usual activities. If symptoms persist, would consider PCI of the LCX  2. Multiple CV risk factors - HTN, HLD, DM  3. Recent ablation from February for SVT -  she may end up requiring permanent pacemaker insertion if she has recurrent symptoms - followed by Dr. Ladona Ridgel  4. HTN - stable on current regimen.Adding low dose Norvasc today.              Current medicines are reviewed with the patient today.  The patient does not have concerns regarding medicines other than what has been noted above.  The following changes have been made:  See above.  Labs/ tests ordered today include:   No orders of the defined types were placed in this encounter.     Disposition:   FU with Dr. [name] in 4 weeks - has OV already scheduled.   Patient is agreeable to this plan and will call if any problems develop in the interim.   Signed: Rosalio Macadamia, RN, ANP-C 12/15/2014 9:55 AM  [facility] [address] [place], [place]  [address] Phone: [phone] Fax: [phone]

## 2014-12-15 NOTE — Patient Instructions (Addendum)
We will be checking the following labs today - None   Medication Instructions:    Continue with your current medicines.   I am adding Norvasc 2.5 mg to take each day - this has been sent to your drug store    Testing/Procedures To Be Arranged:  N/A  Follow-Up:   See Dr. [name] as planned  Other Special Instructions:   N/A  Call the [facility] office at [phone] if you have any questions, problems or concerns.

## 2015-01-10 NOTE — Progress Notes (Signed)
Patient ID: Maria [name], female   DOB: 1940-12-14, 74 y.o.   MRN: [id number]     CARDIOLOGY OFFICE NOTE  Date:  01/11/2015    Maria [name] Date of Birth: 1940-06-23 Medical Record #[id number]  PCP:  Crawford Givens, MD  Cardiologist:  [name]    No chief complaint on file.   History of Present Illness: Maria [name] is a 74 y.o. female who presents today for a post cath visit. Ongoing Medical Rx single vessel Circumflex disease with abnormal myovue .   She has previously had no known CAD. Has SVT and has had recent ablation per Dr. Ladona Ridgel back in February of 2016. Other issues include HTN, HLD, GERD and DM. Reportedly with Myoview from 2008. Echo from 2008 with normal LV function.  She has had refractory SVT and underwent catheter ablation of a parahisian atrial tachycardia which was successfully ablated from the non-coronary cusp of the aortic root. Since her ablation she did well until a few weeks ago when she began to experience dizziness as well as shortness of breath and chest heaviness. Event monitor was placed. I saw her back for the chest pain. Beta blocker stopped due to pauses. I got her Myoview and echo updated - see below. Myoview abnormal and she was referred on for cardiac cath - to try and manage medically - see below. Was placed on Imdur and beta blocker but did not wish to take Coreg (has allergies - nightmares).   Comes in today. Here with her husband.She notes that she is doing ok. She has still had some issues with chest tightness and dyspnea. She has not returned to all of her usual activities. She is on lower dose of her Atenolol - she has had bradycardia and pauses noted from May of 2016 when she wore her event monitor. Does not tolerate Coreg. Headache has improved with the Imdur. She feels like overall she is about 75% improved. She would like to resume her regular activities.    Past Medical History  Diagnosis Date  . Hypertension 1987  . Hyperlipidemia 1990    . GERD (gastroesophageal reflux disease)   . Diabetes mellitus 2002    Type II  . Depression 1988  . Anxiety 1988  . Anemia 05/1985  . Right knee pain 02/2010    Injected - Dr. Shelle Iron  . Renal cyst 07/10/2006    bilateral  . DDD (degenerative disc disease), lumbar   . DDD (degenerative disc disease), cervical   . Goiter     multinodular  . Reflux esophagitis   . PSVT (paroxysmal supraventricular tachycardia)   . Syncope   . Fracture of lower leg 07/2001    Right    Past Surgical History  Procedure Laterality Date  . Esophagogastroduodenoscopy  08/14/2005    gastropathy biopsy, negative  . Arthrodesis  10/03/2005    C4/5; C5/6 surgical decompression  . Cholecystectomy  08/1992  . Partial hysterectomy  1968    spotting  . Breast lumpectomy  1967    multiple B9  . Laminectomy  09/2001    due to herniated disc  . Ablation of dysrhythmic focus  07/06/2014    dr taylor  . Supraventricular tachycardia ablation N/A 07/06/2014    Procedure: SUPRAVENTRICULAR TACHYCARDIA ABLATION;  Surgeon: Marinus Maw, MD;  Location: [facility] CATH LAB;  Service: Cardiovascular;  Laterality: N/A;  . Cardiac catheterization N/A 11/18/2014    Procedure: Left Heart Cath and Coronary Angiography;  Surgeon: Peter M Swaziland, MD;  Location: [facility] INVASIVE CV LAB;  Service: Cardiovascular;  Laterality: N/A;     Medications: Current Outpatient Prescriptions  Medication Sig Dispense Refill  . amLODipine (NORVASC) 2.5 MG tablet Take 1 tablet (2.5 mg total) by mouth daily. 30 tablet 6  . aspirin EC 81 MG tablet Take 1 tablet (81 mg total) by mouth daily. 90 tablet 3  . atenolol (TENORMIN) 25 MG tablet Take 1 tablet (25 mg total) by mouth daily. 30 tablet 6  . celecoxib (CELEBREX) 200 MG capsule Take 200 mg by mouth daily as needed for mild pain or moderate pain.    [name] glyBURIDE (DIABETA) 5 MG tablet Take 1 tablet by mouth in AM and 0.5-1 tablet by mouth in PM 180 tablet 3  . isosorbide mononitrate (IMDUR) 30 MG 24  hr tablet Take 1 tablet (30 mg total) by mouth daily. 90 tablet 3  . losartan (COZAAR) 50 MG tablet Take 1 tablet (50 mg total) by mouth every evening. 90 tablet 3  . nitroGLYCERIN (NITROSTAT) 0.4 MG SL tablet Place 0.4 mg under the tongue every 5 (five) minutes as needed for chest pain (no more than 3 doses).    [name] omeprazole (PRILOSEC) 40 MG capsule Take 1 capsule by mouth daily as needed. Heartburn or acid reflux    . sucralfate (CARAFATE) 1 G tablet Take 1 g by mouth daily.    . traMADol (ULTRAM) 50 MG tablet Take 50 mg by mouth daily as needed. For pain     No current facility-administered medications for this visit.    Allergies: Allergies  Allergen Reactions  . Acetaminophen     REACTION: jittery with plain tylenol, but tolerated tylenol/benadryl combination  . Carvedilol     REACTION: nightmares  . Crestor [Rosuvastatin]     aches  . Ezetimibe-Simvastatin     REACTION: muscle aches  . Ibuprofen     REACTION: GI upset  . Insulin Glargine     Rash  . Levemir [Insulin Detemir]     rash  . Macrobid [Nitrofurantoin Macrocrystal] Other (See Comments)    Achey, N & V  . Metformin     REACTION: GI upset  . Metoprolol Tartrate     REACTION: nightmares  . Naproxen     REACTION: GI upset    Social History: The patient  reports that she has never smoked. She has never used smokeless tobacco. She reports that she does not drink alcohol or use illicit drugs.   Family History: The patient's family history includes Depression in her paternal aunt; Diabetes in her brother, maternal grandmother, paternal grandfather, paternal grandmother, and sister; Heart disease in her maternal grandfather and mother; Hypertension in her maternal grandfather, maternal grandmother, mother, paternal grandfather, and paternal grandmother; Myasthenia gravis in her sister; Stroke in her maternal grandmother.   Review of Systems: Please see the history of present illness.   Otherwise, the review of  systems is positive for none.   All other systems are reviewed and negative.   Physical Exam: VS:  BP 104/50 mmHg  Pulse 64  Ht 5\' 6"  (1.676 m)  Wt 82.283 kg (181 lb 6.4 oz)  BMI 29.29 kg/m2 .  BMI Body mass index is 29.29 kg/(m^2).  Wt Readings from Last 3 Encounters:  01/11/15 82.283 kg (181 lb 6.4 oz)  12/15/14 82.155 kg (181 lb 1.9 oz)  11/18/14 79.379 kg (175 lb)    General: Pleasant. Well developed, well nourished and in no acute distress.  HEENT: Normal. Neck: Supple,  no JVD, carotid bruits, or masses noted.  Cardiac: Regular rate and rhythm. No murmurs, rubs, or gallops. No edema.  Respiratory:  Lungs are clear to auscultation bilaterally with normal work of breathing.  GI: Soft and nontender.  MS: No deformity or atrophy. Gait and ROM intact. Skin: Warm and dry. Color is normal. Cath site is ok. Neuro:  Strength and sensation are intact and no gross focal deficits noted.  Psych: Alert, appropriate and with normal affect.   LABORATORY DATA:  EKG:  EKG is not ordered today.   Lab Results  Component Value Date   WBC 6.8 11/13/2014   HGB 13.1 11/13/2014   HCT 38.9 11/13/2014   PLT 386.0 11/13/2014   GLUCOSE 153* 11/13/2014   CHOL 183 12/18/2013   TRIG 64.0 12/18/2013   HDL 39.90 12/18/2013   LDLDIRECT 168.1 02/18/2010   LDLCALC 130* 12/18/2013   ALT 15 07/31/2013   AST 17 07/31/2013   NA 139 11/13/2014   K 4.5 11/13/2014   CL 105 11/13/2014   CREATININE 1.07 11/13/2014   BUN 17 11/13/2014   CO2 26 11/13/2014   TSH 1.76 07/31/2013   INR 1.0 11/13/2014   HGBA1C 6.3 07/27/2014   MICROALBUR 0.5 02/16/2009    BNP (last 3 results) No results for input(s): BNP in the last 8760 hours.  ProBNP (last 3 results)  Recent Labs  10/27/14 1455  PROBNP 37.0     Other Studies Reviewed Today:   Cardiac Cath Conclusion from 11/2014     Prox RCA lesion, 30% stenosed.  1st Diag lesion, 35% stenosed.  Prox Cx to Mid Cx lesion, 70% stenosed.  The left  ventricular systolic function is normal.  1. Single vessel obstructive CAD with moderate stenosis in the LCx prior to a large OM. 2. Normal LV function  Recommendation: Per AUC would recommend optimizing medical therapy for angina. If she has refractory anginal symptoms despite 2 antianginal medications then PCI of the LCx would be reasonable.      Interpretation Summary     Nuclear stress EF: 78%.  Upsloping ST segment depression ST segment depression of 1 mm was noted during stress in the inferolateral leads (II, III, aVF, V5 and V6), and returning to baseline after less than 1 minute of recovery.  Defect 1: There is a small defect of mild severity present in the apical anterior location.  Defect 2: There is a medium defect of mild severity present in the basal inferolateral, mid inferolateral and apical lateral location.  Findings consistent with ischemia.  This is an intermediate risk study.  The left ventricular ejection fraction is normal (55-65%).  There are two defects:  1. A small, mild reversible defect in the distal anterior wall. 2. A medium size, mild partially reversible defect in the basal and mid inferolateral and apical lateral walls, this might be partially affected by an extracardiac activity present mostly on resting images.   These findings are consistent with ischemia in two different regions, overall SDS is 4.    Echo Study Conclusions from 2016  - Left ventricle: The cavity size was normal. Wall thickness was increased in a pattern of mild LVH. Systolic function was normal. The estimated ejection fraction was in the range of 60% to 65%. Wall motion was normal; there were no regional wall motion abnormalities. Doppler parameters are consistent with abnormal left ventricular relaxation (grade 1 diastolic dysfunction). - Aortic valve: There was no stenosis. - Mitral valve: There was trivial regurgitation. - Right ventricle:  The cavity  size was normal. Systolic function was normal. - Tricuspid valve: Peak RV-RA gradient (S): 23 mm Hg. - Pulmonary arteries: PA peak pressure: 26 mm Hg (S). - Inferior vena cava: The vessel was normal in size. The respirophasic diameter changes were in the normal range (>= 50%), consistent with normal central venous pressure.  Impressions:  - Normal LV size with mild LV hypertrophy, EF 60-65%. Normal RV size and systolic function. No significant valvular abnormalities      Assessment/Plan: 1. Chest pain/dyspnea - abnormal Myoview - s/p cath and to try and manage medically. She is on low dose atenolol, low dose Imdur and still with some symptoms. I do not think we will be able to increase her beta blocker anymore due to past documentation of profound bradycardia/pauses. Will add low dose Norvasc. Reassess later this month. She is to get back to her usual activities. If symptoms persist, would consider PCI of the LCX  2. Multiple CV risk factors - HTN, HLD, DM  3. Recent ablation from February for SVT -  she may end up requiring permanent pacemaker insertion if she has recurrent symptoms - followed by Dr. Ladona Ridgel  4. HTN - stable on current regimen.Adding low dose Norvasc today.              Current medicines are reviewed with the patient today.  The patient does not have concerns regarding medicines other than what has been noted above.  The following changes have been made:  See above.  Labs/ tests ordered today include:   No orders of the defined types were placed in this encounter.     Disposition:   FU with Dr. [name] in 4 weeks - has OV already scheduled.   Patient is agreeable to this plan and will call if any problems develop in the interim.   Charlton Haws

## 2015-01-11 ENCOUNTER — Encounter: Payer: Self-pay | Admitting: Cardiovascular Disease

## 2015-01-11 ENCOUNTER — Ambulatory Visit (INDEPENDENT_AMBULATORY_CARE_PROVIDER_SITE_OTHER): Payer: Medicare Other | Admitting: Cardiovascular Disease

## 2015-01-11 ENCOUNTER — Encounter: Payer: Self-pay | Admitting: *Deleted

## 2015-01-11 VITALS — BP 104/50 | HR 64 | Ht 66.0 in | Wt 181.4 lb

## 2015-01-11 DIAGNOSIS — Z5181 Encounter for therapeutic drug level monitoring: Secondary | ICD-10-CM

## 2015-01-11 DIAGNOSIS — I10 Essential (primary) hypertension: Secondary | ICD-10-CM

## 2015-01-11 DIAGNOSIS — I259 Chronic ischemic heart disease, unspecified: Secondary | ICD-10-CM

## 2015-01-11 DIAGNOSIS — Z01818 Encounter for other preprocedural examination: Secondary | ICD-10-CM

## 2015-01-11 LAB — CBC WITH DIFFERENTIAL/PLATELET
BASOS PCT: 0.3 % (ref 0.0–3.0)
Basophils Absolute: 0 10*3/uL (ref 0.0–0.1)
EOS ABS: 0.4 10*3/uL (ref 0.0–0.7)
Eosinophils Relative: 5.4 % — ABNORMAL HIGH (ref 0.0–5.0)
HCT: 36.6 % (ref 36.0–46.0)
HEMOGLOBIN: 12.2 g/dL (ref 12.0–15.0)
Lymphocytes Relative: 25.9 % (ref 12.0–46.0)
Lymphs Abs: 2.1 10*3/uL (ref 0.7–4.0)
MCHC: 33.3 g/dL (ref 30.0–36.0)
MCV: 89.6 fl (ref 78.0–100.0)
MONO ABS: 0.4 10*3/uL (ref 0.1–1.0)
Monocytes Relative: 5.6 % (ref 3.0–12.0)
NEUTROS ABS: 5 10*3/uL (ref 1.4–7.7)
Neutrophils Relative %: 62.8 % (ref 43.0–77.0)
PLATELETS: 343 10*3/uL (ref 150.0–400.0)
RBC: 4.08 Mil/uL (ref 3.87–5.11)
RDW: 13.2 % (ref 11.5–15.5)
WBC: 8 10*3/uL (ref 4.0–10.5)

## 2015-01-11 LAB — BASIC METABOLIC PANEL
BUN: 17 mg/dL (ref 6–23)
CHLORIDE: 106 meq/L (ref 96–112)
CO2: 26 mEq/L (ref 19–32)
Calcium: 9.1 mg/dL (ref 8.4–10.5)
Creatinine, Ser: 1.09 mg/dL (ref 0.40–1.20)
GFR: 51.95 mL/min — AB (ref >60.00)
Glucose, Bld: 177 mg/dL — ABNORMAL HIGH (ref 70–99)
Potassium: 3.9 mEq/L (ref 3.5–5.1)
SODIUM: 140 meq/L (ref 135–145)

## 2015-01-11 LAB — PROTIME-INR
INR: 1.1 ratio — ABNORMAL HIGH (ref 0.8–1.0)
Prothrombin Time: 12.3 s (ref 9.6–13.1)

## 2015-01-11 MED ORDER — SULFAMETHOXAZOLE-TRIMETHOPRIM 800-160 MG PO TABS: 1.0000 | ORAL_TABLET | Freq: Two times a day (BID) | ORAL | Status: DC

## 2015-01-11 NOTE — Patient Instructions (Signed)
Medication Instructions:  NO CHANGES  Labwork: BMET   CBC INR  Testing/Procedures: Your physician has requested that you have a cardiac catheterization. Cardiac catheterization is used to diagnose and/or treat various heart conditions. Doctors may recommend this procedure for a number of different reasons. The most common reason is to evaluate chest pain. Chest pain can be a symptom of coronary artery disease (CAD), and cardiac catheterization can show whether plaque is narrowing or blocking your heart's arteries. This procedure is also used to evaluate the valves, as well as measure the blood flow and oxygen levels in different parts of your heart. For further information please visit [email or web address] Please follow instruction sheet, as given.   Follow-Up: Your physician wants you to follow-up in: 6 MONTHS  WITH DR   Maria [name] will receive a reminder letter in the mail two months in advance. If you don't receive a letter, please call our office to schedule the follow-up appointment.  Any Other Special Instructions Will Be Listed Below (If Applicable).

## 2015-01-22 ENCOUNTER — Encounter (HOSPITAL_COMMUNITY)
Admission: RE | Disposition: A | Discharge status: Home / Self Care | Payer: Self-pay | Source: Ambulatory Visit | Attending: Cardiology

## 2015-01-22 ENCOUNTER — Encounter (HOSPITAL_COMMUNITY): Payer: Self-pay | Admitting: Cardiology

## 2015-01-22 ENCOUNTER — Ambulatory Visit (HOSPITAL_COMMUNITY)
Admission: RE | Admit: 2015-01-22 | Discharge: 2015-01-23 | Disposition: A | Discharge status: Home / Self Care | Payer: Medicare Other | Source: Ambulatory Visit | Attending: Cardiology | Admitting: Cardiology

## 2015-01-22 DIAGNOSIS — I25119 Atherosclerotic heart disease of native coronary artery with unspecified angina pectoris: Secondary | ICD-10-CM | POA: Diagnosis present

## 2015-01-22 DIAGNOSIS — Z23 Encounter for immunization: Secondary | ICD-10-CM | POA: Insufficient documentation

## 2015-01-22 DIAGNOSIS — E1159 Type 2 diabetes mellitus with other circulatory complications: Secondary | ICD-10-CM

## 2015-01-22 DIAGNOSIS — I2510 Atherosclerotic heart disease of native coronary artery without angina pectoris: Secondary | ICD-10-CM | POA: Diagnosis present

## 2015-01-22 DIAGNOSIS — R9439 Abnormal result of other cardiovascular function study: Secondary | ICD-10-CM | POA: Diagnosis present

## 2015-01-22 DIAGNOSIS — E785 Hyperlipidemia, unspecified: Secondary | ICD-10-CM | POA: Insufficient documentation

## 2015-01-22 DIAGNOSIS — Z7982 Long term (current) use of aspirin: Secondary | ICD-10-CM | POA: Insufficient documentation

## 2015-01-22 DIAGNOSIS — I209 Angina pectoris, unspecified: Secondary | ICD-10-CM

## 2015-01-22 DIAGNOSIS — R079 Chest pain, unspecified: Secondary | ICD-10-CM | POA: Diagnosis present

## 2015-01-22 DIAGNOSIS — I10 Essential (primary) hypertension: Secondary | ICD-10-CM | POA: Diagnosis present

## 2015-01-22 DIAGNOSIS — E119 Type 2 diabetes mellitus without complications: Secondary | ICD-10-CM | POA: Insufficient documentation

## 2015-01-22 HISTORY — DX: Atherosclerotic heart disease of native coronary artery without angina pectoris: I25.10

## 2015-01-22 HISTORY — DX: Syncope and collapse: R55

## 2015-01-22 HISTORY — DX: Dorsalgia, unspecified: M54.9

## 2015-01-22 HISTORY — DX: Type 2 diabetes mellitus without complications: E11.9

## 2015-01-22 HISTORY — DX: Pneumonia, unspecified organism: J18.9

## 2015-01-22 HISTORY — PX: CARDIAC CATHETERIZATION: SHX172

## 2015-01-22 HISTORY — DX: Nontoxic multinodular goiter: E04.2

## 2015-01-22 HISTORY — DX: Other chronic pain: G89.29

## 2015-01-22 LAB — POCT ACTIVATED CLOTTING TIME
ACTIVATED CLOTTING TIME: 257 s
ACTIVATED CLOTTING TIME: 300 s

## 2015-01-22 LAB — GLUCOSE, CAPILLARY
Glucose-Capillary: 140 mg/dL — ABNORMAL HIGH (ref 65–99)
Glucose-Capillary: 152 mg/dL — ABNORMAL HIGH (ref 65–99)
Glucose-Capillary: 92 mg/dL (ref 65–99)
Glucose-Capillary: 134 mg/dL — ABNORMAL HIGH (ref 65–99)

## 2015-01-22 SURGERY — CORONARY STENT INTERVENTION

## 2015-01-22 MED ORDER — SODIUM CHLORIDE 0.9 % WEIGHT BASED INFUSION
1.0000 mL/kg/h | INTRAVENOUS | Status: DC
  Administered 2015-01-22: 1 mL/kg/h via INTRAVENOUS

## 2015-01-22 MED ORDER — FENTANYL CITRATE (PF) 100 MCG/2ML IJ SOLN
INTRAMUSCULAR | Status: DC | PRN
  Administered 2015-01-22: 25 ug via INTRAVENOUS

## 2015-01-22 MED ORDER — NITROGLYCERIN 1 MG/10 ML FOR IR/CATH LAB
INTRA_ARTERIAL | Status: AC
  Filled 2015-01-22: qty 10

## 2015-01-22 MED ORDER — AMLODIPINE BESYLATE 5 MG PO TABS
2.5000 mg | ORAL_TABLET | Freq: Every day | ORAL | Status: DC
  Administered 2015-01-22: 20:00:00 2.5 mg via ORAL
  Filled 2015-01-22 (×2): qty 1

## 2015-01-22 MED ORDER — SODIUM CHLORIDE 0.9 % IV SOLN: 250.0000 mL | INTRAVENOUS | Status: DC | PRN

## 2015-01-22 MED ORDER — SODIUM CHLORIDE 0.9 % IJ SOLN: 3.0000 mL | Freq: Two times a day (BID) | INTRAMUSCULAR | Status: DC

## 2015-01-22 MED ORDER — ASPIRIN 81 MG PO CHEW
81.0000 mg | CHEWABLE_TABLET | ORAL | Status: DC
Start: 2015-01-22 — End: 2015-01-22

## 2015-01-22 MED ORDER — INFLUENZA VAC SPLIT QUAD 0.5 ML IM SUSY
0.5000 mL | PREFILLED_SYRINGE | INTRAMUSCULAR | Status: AC
Start: 2015-01-23 — End: 2015-01-23
  Administered 2015-01-23: 0.5 mL via INTRAMUSCULAR
  Filled 2015-01-22: qty 0.5

## 2015-01-22 MED ORDER — NITROGLYCERIN 0.4 MG SL SUBL: 0.4000 mg | SUBLINGUAL_TABLET | SUBLINGUAL | Status: DC | PRN

## 2015-01-22 MED ORDER — SODIUM CHLORIDE 0.9 % WEIGHT BASED INFUSION
3.0000 mL/kg/h | INTRAVENOUS | Status: AC
  Administered 2015-01-22: 10:00:00 3 mL/kg/h via INTRAVENOUS

## 2015-01-22 MED ORDER — FENTANYL CITRATE (PF) 100 MCG/2ML IJ SOLN
INTRAMUSCULAR | Status: AC
  Filled 2015-01-22: qty 4

## 2015-01-22 MED ORDER — IOHEXOL 350 MG/ML SOLN
INTRAVENOUS | Status: DC | PRN
  Administered 2015-01-22: 70 mL via INTRA_ARTERIAL

## 2015-01-22 MED ORDER — VERAPAMIL HCL 2.5 MG/ML IV SOLN
INTRAVENOUS | Status: AC
  Filled 2015-01-22: qty 2

## 2015-01-22 MED ORDER — TICAGRELOR 90 MG PO TABS
90.0000 mg | ORAL_TABLET | Freq: Two times a day (BID) | ORAL | Status: DC
  Administered 2015-01-22 – 2015-01-23 (×2): 90 mg via ORAL
  Filled 2015-01-22 (×2): qty 1

## 2015-01-22 MED ORDER — HEPARIN SODIUM (PORCINE) 1000 UNIT/ML IJ SOLN
INTRAMUSCULAR | Status: AC
  Filled 2015-01-22: qty 1

## 2015-01-22 MED ORDER — LIDOCAINE HCL (PF) 1 % IJ SOLN
INTRAMUSCULAR | Status: DC | PRN
  Administered 2015-01-22: 09:00:00

## 2015-01-22 MED ORDER — GLYBURIDE 5 MG PO TABS
5.0000 mg | ORAL_TABLET | Freq: Every day | ORAL | Status: DC
  Administered 2015-01-23: 08:00:00 5 mg via ORAL
  Filled 2015-01-22 (×3): qty 1

## 2015-01-22 MED ORDER — LOSARTAN POTASSIUM 50 MG PO TABS
50.0000 mg | ORAL_TABLET | Freq: Every evening | ORAL | Status: DC
  Administered 2015-01-22: 17:00:00 50 mg via ORAL
  Filled 2015-01-22: qty 1

## 2015-01-22 MED ORDER — TRAMADOL HCL 50 MG PO TABS
50.0000 mg | ORAL_TABLET | Freq: Every day | ORAL | Status: DC | PRN
Start: 2015-01-22 — End: 2015-01-22
  Administered 2015-01-22: 11:00:00 50 mg via ORAL
  Filled 2015-01-22: qty 1

## 2015-01-22 MED ORDER — SODIUM CHLORIDE 0.9 % IJ SOLN: 3.0000 mL | INTRAMUSCULAR | Status: DC | PRN

## 2015-01-22 MED ORDER — HEPARIN (PORCINE) IN NACL 2-0.9 UNIT/ML-% IJ SOLN
INTRAMUSCULAR | Status: AC
  Filled 2015-01-22: qty 1000

## 2015-01-22 MED ORDER — PANTOPRAZOLE SODIUM 40 MG PO TBEC
40.0000 mg | DELAYED_RELEASE_TABLET | Freq: Every day | ORAL | Status: DC
  Administered 2015-01-22: 10:00:00 40 mg via ORAL
  Filled 2015-01-22: qty 1

## 2015-01-22 MED ORDER — VERAPAMIL HCL 2.5 MG/ML IV SOLN
INTRAVENOUS | Status: DC | PRN
  Administered 2015-01-22: 08:00:00 via INTRA_ARTERIAL

## 2015-01-22 MED ORDER — TRAMADOL HCL 50 MG PO TABS
50.0000 mg | ORAL_TABLET | Freq: Four times a day (QID) | ORAL | Status: DC | PRN
  Administered 2015-01-22: 13:00:00 50 mg via ORAL
  Filled 2015-01-22: qty 1

## 2015-01-22 MED ORDER — ASPIRIN EC 81 MG PO TBEC
81.0000 mg | DELAYED_RELEASE_TABLET | Freq: Every day | ORAL | Status: DC
  Administered 2015-01-23: 81 mg via ORAL
  Filled 2015-01-22 (×2): qty 1

## 2015-01-22 MED ORDER — ONDANSETRON HCL 4 MG/2ML IJ SOLN: 4.0000 mg | Freq: Four times a day (QID) | INTRAMUSCULAR | Status: DC | PRN

## 2015-01-22 MED ORDER — ACETAMINOPHEN 325 MG PO TABS
650.0000 mg | ORAL_TABLET | ORAL | Status: DC | PRN
  Administered 2015-01-22: 650 mg via ORAL
  Filled 2015-01-22: qty 2

## 2015-01-22 MED ORDER — LIDOCAINE HCL (PF) 1 % IJ SOLN
INTRAMUSCULAR | Status: AC
  Filled 2015-01-22: qty 30

## 2015-01-22 MED ORDER — TICAGRELOR 90 MG PO TABS
180.0000 mg | ORAL_TABLET | Freq: Once | ORAL | Status: AC
  Administered 2015-01-22: 180 mg via ORAL
  Filled 2015-01-22: qty 2

## 2015-01-22 MED ORDER — INSULIN ASPART 100 UNIT/ML ~~LOC~~ SOLN: 0.0000 [IU] | Freq: Every day | SUBCUTANEOUS | Status: DC

## 2015-01-22 MED ORDER — INSULIN ASPART 100 UNIT/ML ~~LOC~~ SOLN: 0.0000 [IU] | Freq: Three times a day (TID) | SUBCUTANEOUS | Status: DC

## 2015-01-22 MED ORDER — MIDAZOLAM HCL 2 MG/2ML IJ SOLN
INTRAMUSCULAR | Status: AC
  Filled 2015-01-22: qty 4

## 2015-01-22 MED ORDER — MIDAZOLAM HCL 2 MG/2ML IJ SOLN
INTRAMUSCULAR | Status: DC | PRN
  Administered 2015-01-22: 2 mg via INTRAVENOUS

## 2015-01-22 MED ORDER — ATENOLOL 25 MG PO TABS
25.0000 mg | ORAL_TABLET | Freq: Every day | ORAL | Status: DC
  Administered 2015-01-23: 10:00:00 25 mg via ORAL
  Filled 2015-01-22: qty 1

## 2015-01-22 MED ORDER — HEPARIN SODIUM (PORCINE) 1000 UNIT/ML IJ SOLN
INTRAMUSCULAR | Status: DC | PRN
  Administered 2015-01-22: 7000 [IU] via INTRAVENOUS
  Administered 2015-01-22: 1000 [IU] via INTRAVENOUS

## 2015-01-22 MED ORDER — SODIUM CHLORIDE 0.9 % WEIGHT BASED INFUSION
3.0000 mL/kg/h | INTRAVENOUS | Status: DC
  Administered 2015-01-22: 3 mL/kg/h via INTRAVENOUS

## 2015-01-22 SURGICAL SUPPLY — 14 items
BALLN EMERGE MR 2.5X12 (BALLOONS) ×3
BALLN ~~LOC~~ EMERGE MR 3.5X12 (BALLOONS) ×3
BALLOON EMERGE MR 2.5X12 (BALLOONS) IMPLANT
BALLOON ~~LOC~~ EMERGE MR 3.5X12 (BALLOONS) IMPLANT
GLIDESHEATH SLEND SS 6F .021 (SHEATH) ×2 IMPLANT
GUIDE CATH RUNWAY 6FR CLS3.5 (CATHETERS) ×2 IMPLANT
KIT ENCORE 26 ADVANTAGE (KITS) ×3 IMPLANT
KIT HEART LEFT (KITS) ×3 IMPLANT
PACK CARDIAC CATHETERIZATION (CUSTOM PROCEDURE TRAY) ×3 IMPLANT
STENT PROMUS PREM MR 3.5X16 (Permanent Stent) ×2 IMPLANT
TRANSDUCER W/STOPCOCK (MISCELLANEOUS) ×3 IMPLANT
TUBING CIL FLEX 10 FLL-RA (TUBING) ×3 IMPLANT
WIRE ASAHI PROWATER 180CM (WIRE) ×2 IMPLANT
WIRE SAFE-T 1.5MM-J .035X260CM (WIRE) ×2 IMPLANT

## 2015-01-22 NOTE — Progress Notes (Signed)
TR BAND REMOVAL  LOCATION:  right radial  DEFLATED PER PROTOCOL:  Yes.    TIME BAND OFF / DRESSING APPLIED:   1300   SITE UPON ARRIVAL:   Level 0  SITE AFTER BAND REMOVAL:  Level 0  CIRCULATION SENSATION AND MOVEMENT:  Within Normal Limits  Yes.    COMMENTS:    

## 2015-01-22 NOTE — H&P (View-Only) (Signed)
Patient ID: Maria [name], female   DOB: 1940-12-14, 74 y.o.   MRN: [id number]     CARDIOLOGY OFFICE NOTE  Date:  01/11/2015    Maria [name] Date of Birth: 1940-06-23 Medical Record #[id number]  PCP:  Crawford Givens, MD  Cardiologist:  [name]    No chief complaint on file.   History of Present Illness: Maria [name] is a 74 y.o. female who presents today for a post cath visit. Ongoing Medical Rx single vessel Circumflex disease with abnormal myovue .   She has previously had no known CAD. Has SVT and has had recent ablation per Dr. Ladona Ridgel back in February of 2016. Other issues include HTN, HLD, GERD and DM. Reportedly with Myoview from 2008. Echo from 2008 with normal LV function.  She has had refractory SVT and underwent catheter ablation of a parahisian atrial tachycardia which was successfully ablated from the non-coronary cusp of the aortic root. Since her ablation she did well until a few weeks ago when she began to experience dizziness as well as shortness of breath and chest heaviness. Event monitor was placed. I saw her back for the chest pain. Beta blocker stopped due to pauses. I got her Myoview and echo updated - see below. Myoview abnormal and she was referred on for cardiac cath - to try and manage medically - see below. Was placed on Imdur and beta blocker but did not wish to take Coreg (has allergies - nightmares).   Comes in today. Here with her husband.She notes that she is doing ok. She has still had some issues with chest tightness and dyspnea. She has not returned to all of her usual activities. She is on lower dose of her Atenolol - she has had bradycardia and pauses noted from May of 2016 when she wore her event monitor. Does not tolerate Coreg. Headache has improved with the Imdur. She feels like overall she is about 75% improved. She would like to resume her regular activities.    Past Medical History  Diagnosis Date  . Hypertension 1987  . Hyperlipidemia 1990    . GERD (gastroesophageal reflux disease)   . Diabetes mellitus 2002    Type II  . Depression 1988  . Anxiety 1988  . Anemia 05/1985  . Right knee pain 02/2010    Injected - Dr. Shelle Iron  . Renal cyst 07/10/2006    bilateral  . DDD (degenerative disc disease), lumbar   . DDD (degenerative disc disease), cervical   . Goiter     multinodular  . Reflux esophagitis   . PSVT (paroxysmal supraventricular tachycardia)   . Syncope   . Fracture of lower leg 07/2001    Right    Past Surgical History  Procedure Laterality Date  . Esophagogastroduodenoscopy  08/14/2005    gastropathy biopsy, negative  . Arthrodesis  10/03/2005    C4/5; C5/6 surgical decompression  . Cholecystectomy  08/1992  . Partial hysterectomy  1968    spotting  . Breast lumpectomy  1967    multiple B9  . Laminectomy  09/2001    due to herniated disc  . Ablation of dysrhythmic focus  07/06/2014    dr taylor  . Supraventricular tachycardia ablation N/A 07/06/2014    Procedure: SUPRAVENTRICULAR TACHYCARDIA ABLATION;  Surgeon: Marinus Maw, MD;  Location: [facility] CATH LAB;  Service: Cardiovascular;  Laterality: N/A;  . Cardiac catheterization N/A 11/18/2014    Procedure: Left Heart Cath and Coronary Angiography;  Surgeon: Ronna Herskowitz M [place], MD;  Location: [facility] INVASIVE CV LAB;  Service: Cardiovascular;  Laterality: N/A;     Medications: Current Outpatient Prescriptions  Medication Sig Dispense Refill  . amLODipine (NORVASC) 2.5 MG tablet Take 1 tablet (2.5 mg total) by mouth daily. 30 tablet 6  . aspirin EC 81 MG tablet Take 1 tablet (81 mg total) by mouth daily. 90 tablet 3  . atenolol (TENORMIN) 25 MG tablet Take 1 tablet (25 mg total) by mouth daily. 30 tablet 6  . celecoxib (CELEBREX) 200 MG capsule Take 200 mg by mouth daily as needed for mild pain or moderate pain.    [name] glyBURIDE (DIABETA) 5 MG tablet Take 1 tablet by mouth in AM and 0.5-1 tablet by mouth in PM 180 tablet 3  . isosorbide mononitrate (IMDUR) 30 MG 24  hr tablet Take 1 tablet (30 mg total) by mouth daily. 90 tablet 3  . losartan (COZAAR) 50 MG tablet Take 1 tablet (50 mg total) by mouth every evening. 90 tablet 3  . nitroGLYCERIN (NITROSTAT) 0.4 MG SL tablet Place 0.4 mg under the tongue every 5 (five) minutes as needed for chest pain (no more than 3 doses).    [name] omeprazole (PRILOSEC) 40 MG capsule Take 1 capsule by mouth daily as needed. Heartburn or acid reflux    . sucralfate (CARAFATE) 1 G tablet Take 1 g by mouth daily.    . traMADol (ULTRAM) 50 MG tablet Take 50 mg by mouth daily as needed. For pain     No current facility-administered medications for this visit.    Allergies: Allergies  Allergen Reactions  . Acetaminophen     REACTION: jittery with plain tylenol, but tolerated tylenol/benadryl combination  . Carvedilol     REACTION: nightmares  . Crestor [Rosuvastatin]     aches  . Ezetimibe-Simvastatin     REACTION: muscle aches  . Ibuprofen     REACTION: GI upset  . Insulin Glargine     Rash  . Levemir [Insulin Detemir]     rash  . Macrobid [Nitrofurantoin Macrocrystal] Other (See Comments)    Achey, N & V  . Metformin     REACTION: GI upset  . Metoprolol Tartrate     REACTION: nightmares  . Naproxen     REACTION: GI upset    Social History: The patient  reports that she has never smoked. She has never used smokeless tobacco. She reports that she does not drink alcohol or use illicit drugs.   Family History: The patient's family history includes Depression in her paternal aunt; Diabetes in her brother, maternal grandmother, paternal grandfather, paternal grandmother, and sister; Heart disease in her maternal grandfather and mother; Hypertension in her maternal grandfather, maternal grandmother, mother, paternal grandfather, and paternal grandmother; Myasthenia gravis in her sister; Stroke in her maternal grandmother.   Review of Systems: Please see the history of present illness.   Otherwise, the review of  systems is positive for none.   All other systems are reviewed and negative.   Physical Exam: VS:  BP 104/50 mmHg  Pulse 64  Ht 5\' 6"  (1.676 m)  Wt 82.283 kg (181 lb 6.4 oz)  BMI 29.29 kg/m2 .  BMI Body mass index is 29.29 kg/(m^2).  Wt Readings from Last 3 Encounters:  01/11/15 82.283 kg (181 lb 6.4 oz)  12/15/14 82.155 kg (181 lb 1.9 oz)  11/18/14 79.379 kg (175 lb)    General: Pleasant. Well developed, well nourished and in no acute distress.  HEENT: Normal. Neck: Supple,  no JVD, carotid bruits, or masses noted.  Cardiac: Regular rate and rhythm. No murmurs, rubs, or gallops. No edema.  Respiratory:  Lungs are clear to auscultation bilaterally with normal work of breathing.  GI: Soft and nontender.  MS: No deformity or atrophy. Gait and ROM intact. Skin: Warm and dry. Color is normal. Cath site is ok. Neuro:  Strength and sensation are intact and no gross focal deficits noted.  Psych: Alert, appropriate and with normal affect.   LABORATORY DATA:  EKG:  EKG is not ordered today.   Lab Results  Component Value Date   WBC 6.8 11/13/2014   HGB 13.1 11/13/2014   HCT 38.9 11/13/2014   PLT 386.0 11/13/2014   GLUCOSE 153* 11/13/2014   CHOL 183 12/18/2013   TRIG 64.0 12/18/2013   HDL 39.90 12/18/2013   LDLDIRECT 168.1 02/18/2010   LDLCALC 130* 12/18/2013   ALT 15 07/31/2013   AST 17 07/31/2013   NA 139 11/13/2014   K 4.5 11/13/2014   CL 105 11/13/2014   CREATININE 1.07 11/13/2014   BUN 17 11/13/2014   CO2 26 11/13/2014   TSH 1.76 07/31/2013   INR 1.0 11/13/2014   HGBA1C 6.3 07/27/2014   MICROALBUR 0.5 02/16/2009    BNP (last 3 results) No results for input(s): BNP in the last 8760 hours.  ProBNP (last 3 results)  Recent Labs  10/27/14 1455  PROBNP 37.0     Other Studies Reviewed Today:   Cardiac Cath Conclusion from 11/2014     Prox RCA lesion, 30% stenosed.  1st Diag lesion, 35% stenosed.  Prox Cx to Mid Cx lesion, 70% stenosed.  The left  ventricular systolic function is normal.  1. Single vessel obstructive CAD with moderate stenosis in the LCx prior to a large OM. 2. Normal LV function  Recommendation: Per AUC would recommend optimizing medical therapy for angina. If she has refractory anginal symptoms despite 2 antianginal medications then PCI of the LCx would be reasonable.      Interpretation Summary     Nuclear stress EF: 78%.  Upsloping ST segment depression ST segment depression of 1 mm was noted during stress in the inferolateral leads (II, III, aVF, V5 and V6), and returning to baseline after less than 1 minute of recovery.  Defect 1: There is a small defect of mild severity present in the apical anterior location.  Defect 2: There is a medium defect of mild severity present in the basal inferolateral, mid inferolateral and apical lateral location.  Findings consistent with ischemia.  This is an intermediate risk study.  The left ventricular ejection fraction is normal (55-65%).  There are two defects:  1. A small, mild reversible defect in the distal anterior wall. 2. A medium size, mild partially reversible defect in the basal and mid inferolateral and apical lateral walls, this might be partially affected by an extracardiac activity present mostly on resting images.   These findings are consistent with ischemia in two different regions, overall SDS is 4.    Echo Study Conclusions from 2016  - Left ventricle: The cavity size was normal. Wall thickness was increased in a pattern of mild LVH. Systolic function was normal. The estimated ejection fraction was in the range of 60% to 65%. Wall motion was normal; there were no regional wall motion abnormalities. Doppler parameters are consistent with abnormal left ventricular relaxation (grade 1 diastolic dysfunction). - Aortic valve: There was no stenosis. - Mitral valve: There was trivial regurgitation. - Right ventricle:  The cavity  size was normal. Systolic function was normal. - Tricuspid valve: Peak RV-RA gradient (S): 23 mm Hg. - Pulmonary arteries: PA peak pressure: 26 mm Hg (S). - Inferior vena cava: The vessel was normal in size. The respirophasic diameter changes were in the normal range (>= 50%), consistent with normal central venous pressure.  Impressions:  - Normal LV size with mild LV hypertrophy, EF 60-65%. Normal RV size and systolic function. No significant valvular abnormalities      Assessment/Plan: 1. Chest pain/dyspnea - abnormal Myoview - s/p cath and to try and manage medically. She is on low dose atenolol, low dose Imdur and still with some symptoms. I do not think we will be able to increase her beta blocker anymore due to past documentation of profound bradycardia/pauses. Will add low dose Norvasc. Reassess later this month. She is to get back to her usual activities. If symptoms persist, would consider PCI of the LCX  2. Multiple CV risk factors - HTN, HLD, DM  3. Recent ablation from February for SVT -  she may end up requiring permanent pacemaker insertion if she has recurrent symptoms - followed by Dr. Ladona Ridgel  4. HTN - stable on current regimen.Adding low dose Norvasc today.              Current medicines are reviewed with the patient today.  The patient does not have concerns regarding medicines other than what has been noted above.  The following changes have been made:  See above.  Labs/ tests ordered today include:   No orders of the defined types were placed in this encounter.     Disposition:   FU with Dr. [name] in 4 weeks - has OV already scheduled.   Patient is agreeable to this plan and will call if any problems develop in the interim.   Charlton Haws

## 2015-01-22 NOTE — Interval H&P Note (Signed)
Cath Lab Visit (complete for each Cath Lab visit)  Clinical Evaluation Leading to the Procedure:   ACS: No.  Non-ACS:    Anginal Classification: CCS II  Anti-ischemic medical therapy: Maximal Therapy (2 or more classes of medications)  Non-Invasive Test Results: Intermediate-risk stress test findings: cardiac mortality 1-3%/year  Prior CABG: No previous CABG      History and Physical Interval Note:  01/22/2015 7:49 AM  Maria [name]  has presented today for surgery, with the diagnosis of cp  The various methods of treatment have been discussed with the patient and family. After consideration of risks, benefits and other options for treatment, the patient has consented to  Procedure(s): Coronary Stent Intervention (N/A) as a surgical intervention .  The patient's history has been reviewed, patient examined, no change in status, stable for surgery.  I have reviewed the patient's chart and labs.  Questions were answered to the patient's satisfaction.     [name] [facility] 01/22/2015 7:50 AM

## 2015-01-22 NOTE — Progress Notes (Signed)
CM spoke with pt regarding Brilinta and provided pt with booklet with 30 day free card enclosed. CM explained card usage and pt verbally stated understanding of how to use card. [place] pharmacy called by CM and confirmed medication is in stock. Benefits check : S/W YVETTE @ [facility] # [phone]         BRILINTA 90 MG BID  30 DAY SUPPLY   COVER- YES  CO-PAY- $ 35.00  TIER- 2 DRUG  PRIOR APPROVAL - NO  PHARMACY: PATIENT USES [facility] # [phone] CM made pt aware. No other needs identified @ present time. Gae Gallop RN,BSN,CM [phone]

## 2015-01-23 DIAGNOSIS — I209 Angina pectoris, unspecified: Secondary | ICD-10-CM

## 2015-01-23 DIAGNOSIS — R079 Chest pain, unspecified: Secondary | ICD-10-CM

## 2015-01-23 DIAGNOSIS — E119 Type 2 diabetes mellitus without complications: Secondary | ICD-10-CM | POA: Diagnosis not present

## 2015-01-23 DIAGNOSIS — I10 Essential (primary) hypertension: Secondary | ICD-10-CM

## 2015-01-23 DIAGNOSIS — I25118 Atherosclerotic heart disease of native coronary artery with other forms of angina pectoris: Secondary | ICD-10-CM

## 2015-01-23 DIAGNOSIS — I25119 Atherosclerotic heart disease of native coronary artery with unspecified angina pectoris: Secondary | ICD-10-CM | POA: Diagnosis not present

## 2015-01-23 DIAGNOSIS — Z23 Encounter for immunization: Secondary | ICD-10-CM | POA: Diagnosis not present

## 2015-01-23 LAB — CBC
HEMATOCRIT: 35.7 % — AB (ref 36.0–46.0)
HEMOGLOBIN: 12.3 g/dL (ref 12.0–15.0)
MCH: 30.4 pg (ref 26.0–34.0)
MCHC: 34.5 g/dL (ref 30.0–36.0)
MCV: 88.4 fL (ref 78.0–100.0)
Platelets: 280 10*3/uL (ref 150–400)
RBC: 4.04 MIL/uL (ref 3.87–5.11)
RDW: 12.7 % (ref 11.5–15.5)
WBC: 6.7 10*3/uL (ref 4.0–10.5)

## 2015-01-23 LAB — BASIC METABOLIC PANEL
ANION GAP: 7 (ref 5–15)
BUN: 9 mg/dL (ref 6–20)
CHLORIDE: 111 mmol/L (ref 101–111)
CO2: 22 mmol/L (ref 22–32)
Calcium: 9 mg/dL (ref 8.9–10.3)
Creatinine, Ser: 0.98 mg/dL (ref 0.44–1.00)
GFR calc non Af Amer: 55 mL/min — ABNORMAL LOW (ref >60)
GLUCOSE: 121 mg/dL — AB (ref 65–99)
Potassium: 4.1 mmol/L (ref 3.5–5.1)
Sodium: 140 mmol/L (ref 135–145)

## 2015-01-23 LAB — GLUCOSE, CAPILLARY: Glucose-Capillary: 121 mg/dL — ABNORMAL HIGH (ref 65–99)

## 2015-01-23 LAB — HEMOGLOBIN A1C
HEMOGLOBIN A1C: 6.5 % — AB (ref 4.8–5.6)
Mean Plasma Glucose: 140 mg/dL

## 2015-01-23 MED ORDER — TICAGRELOR 90 MG PO TABS: 90.0000 mg | ORAL_TABLET | Freq: Two times a day (BID) | ORAL | Status: DC

## 2015-01-23 NOTE — Progress Notes (Signed)
Patient Profile: 74 y/o WF with single vessel CAD and angina pectoris despite optimal medical therapy, admitted to undergo elective PCI on an 80% mid LCx lesion discovered on prior diagnostic cath.   Subjective: Doing well. No recurrent chest pain. No dyspnea. Right radial access site is ok and free from complication.   Objective: Vital signs in last 24 hours: Temp:  [97.5 F (36.4 C)-98.1 F (36.7 C)] 98 F (36.7 C) (09/10 0310) Pulse Rate:  [60-77] 67 (09/10 0310) Resp:  [4-36] 20 (09/10 0310) BP: (116-166)/(44-99) 135/44 mmHg (09/10 0310) SpO2:  [94 %-100 %] 98 % (09/10 0310) Weight:  [184 lb 11.9 oz (83.8 kg)] 184 lb 11.9 oz (83.8 kg) (09/10 0310) Last BM Date: 01/22/15  Intake/Output from previous day: 09/09 0701 - 09/10 0700 In: 1754.4 [P.O.:840; I.V.:914.4] Out: 400 [Urine:400] Intake/Output this shift:    Medications Current Facility-Administered Medications  Medication Dose Route Frequency Provider Last Rate Last Dose  . acetaminophen (TYLENOL) tablet 650 mg  650 mg Oral Q4H PRN Peter M Martinique, MD   650 mg at 01/22/15 0940  . amLODipine (NORVASC) tablet 2.5 mg  2.5 mg Oral Daily Peter M Martinique, MD   2.5 mg at 01/22/15 2016  . aspirin EC tablet 81 mg  81 mg Oral Daily Peter M Martinique, MD      . atenolol (TENORMIN) tablet 25 mg  25 mg Oral Daily Peter M Martinique, MD      . glyBURIDE (DIABETA) tablet 5 mg  5 mg Oral Q breakfast Peter M Martinique, MD   5 mg at 01/22/15 1017  . Influenza vac split quadrivalent PF (FLUARIX) injection 0.5 mL  0.5 mL Intramuscular Tomorrow-1000 Peter M Martinique, MD      . insulin aspart (novoLOG) injection 0-15 Units  0-15 Units Subcutaneous TID WC Rhonda G Barrett, PA-C   0 Units at 01/22/15 1746  . insulin aspart (novoLOG) injection 0-5 Units  0-5 Units Subcutaneous QHS Evelene Croon Barrett, PA-C   0 Units at 01/22/15 2128  . losartan (COZAAR) tablet 50 mg  50 mg Oral QPM Peter M Martinique, MD   50 mg at 01/22/15 1704  . nitroGLYCERIN (NITROSTAT) SL  tablet 0.4 mg  0.4 mg Sublingual Q5 min PRN Peter M Martinique, MD      . ondansetron [facility]) injection 4 mg  4 mg Intravenous Q6H PRN Peter M Martinique, MD      . pantoprazole (PROTONIX) EC tablet 40 mg  40 mg Oral Daily Peter M Martinique, MD   40 mg at 01/22/15 1018  . ticagrelor (BRILINTA) tablet 90 mg  90 mg Oral BID Peter M Martinique, MD   90 mg at 01/22/15 2017  . traMADol (ULTRAM) tablet 50-100 mg  50-100 mg Oral Q6H PRN Evelene Croon Barrett, PA-C   50 mg at 01/22/15 1323    PE: General appearance: alert, cooperative and no distress Neck: no carotid bruit and no JVD Lungs: clear to auscultation bilaterally Heart: regular rate and rhythm, S1, S2 normal, no murmur, click, rub or gallop Extremities: no LEE Pulses: 2+ and symmetric Skin: warm and dry Neurologic: Grossly normal  Lab Results:   Recent Labs  01/23/15 0308  WBC 6.7  HGB 12.3  HCT 35.7*  PLT 280   BMET  Recent Labs  01/23/15 0308  NA 140  K 4.1  CL 111  CO2 22  GLUCOSE 121*  BUN 9  CREATININE 0.98  CALCIUM 9.0    Studies/Results: LHC 01/21/09 Coronary  Findings    Dominance: Right   Left Main  The vessel is normal in caliber is angiographically normal.     Left Anterior Descending   . First Engineer, production   . 1st Diag lesion, 35% stenosed. discrete .     Left Circumflex   . Prox Cx to Mid Cx lesion, 80% stenosed. discrete .   [name] PCI: The pre-interventional distal flow is normal (TIMI 3). Pre-stent angioplasty was performed. 2.5 mm balloon A drug-eluting stent was placed. Post-stent angioplasty was performed. 3.5 mm [place] balloon Maximum pressure: 16 atm. The post-interventional distal flow is normal (TIMI 3). The intervention was successful. No complications occurred at this lesion.  . Supplies used: STENT PROMUS PREM MR 3.5X16  . There is no residual stenosis post intervention.     . First Obtuse Marginal Branch   The vessel is large in size.   [name] Second Obtuse Marginal Branch   The vessel is small in size.       Right Coronary Artery   . Prox RCA lesion, 30% stenosed. tubular .        Assessment/Plan  Principal Problem:   Chest pain with high risk for cardiac etiology Active Problems:   Essential hypertension   Diabetes mellitus without complication   Abnormal nuclear stress test   Coronary atherosclerosis of native coronary artery   Angina pectoris    1. CAD: single vessel CAD s/p PCI + DES to proximal-mid LCx. No recurrent CP. HR and BP both stable. Continue DAPT w/ ASA + Brilinta for a minimum of 1 year given newly placed DES. Continue BB and ARB. Will Rx PRN SL NTG.   2. HTN: BP is stable.   3. DM: non-insulin dependent. On Glyburide.   4: S/P Cardiac Cath: Right radial access site is ok and free from complication. 2+ radial pulse.  Renal function is stable.   5. HLD: Currently not on a statin. LDL 12/2013 was high at 130. Consider adding Lipitor today with f/u FLP and HFT in 6 weeks.    Dispo: plan for discharge today. F/u with APP in 1-2 weeks for post hospital and Dr. Johnsie Cancel in 4-6 weeks.   Brittainy M. Rosita Fire, PA-C 01/23/2015 7:15 AM

## 2015-01-23 NOTE — Progress Notes (Signed)
Patient has already been up walking in the halls with her husband without angina or difficulty.  Education completed regarding cath site care and restrictions, angina symptoms, NTG usage, when to call 911 and when to call the doctor, exercise progression, diabetic and nutrition tips, referred to phase II cardiac rehab @ [facility].  Patient and husband very receptive to education.  They are aware that their diets need attention and will make the necessary changes.  Discussed aspirin and anti-platelet therapy importance.   [id number]

## 2015-01-23 NOTE — Discharge Summary (Signed)
Physician Discharge Summary  Patient ID: Maria [name] MRN: [id number] DOB/AGE: 09/16/1940 74 y.o.   Primary Cardiologist: Dr. [name]  Admit date: 01/22/2015 Discharge date: 01/23/2015  Admission Diagnoses: Angina/ CAD  Discharge Diagnoses:  Principal Problem:   Chest pain with high risk for cardiac etiology Active Problems:   Essential hypertension   Diabetes mellitus without complication   Abnormal nuclear stress test   Coronary atherosclerosis of native coronary artery   Angina pectoris   Discharged Condition: stable  Hospital Course: 74 y/o WF with single vessel CAD and angina pectoris despite optimal medical therapy, admitted to undergo elective PCI on an 80% mid LCx lesion discovered on prior diagnostic cath. The procedure was performed by Dr. Jordan on 01/22/15. She underwent successful PCI + DES to the proximal-mid LCx. She tolerated the procedure well and left the cath lab in stable condition. She was placed on DAPT with ASA + Brilinta and continued on a BB and ARB. She denied any further CP. She had no post cath complications. Her HR and BP remained stable, as well as her renal function. She was last seen and examined by Dr. Yates who determined she was stable for discharge home.   Consults: None  Significant Diagnostic Studies/Treatments: LHC 01/22/15  Coronary Findings    Dominance: Right   Left Main  The vessel is normal in caliber is angiographically normal.     Left Anterior Descending   . First Diagonal Branch   . 1st Diag lesion, 35% stenosed. discrete .     Left Circumflex   . Prox Cx to Mid Cx lesion, 80% stenosed. discrete .   . PCI: The pre-interventional distal flow is normal (TIMI 3). Pre-stent angioplasty was performed. 2.5 mm balloon A drug-eluting stent was placed. Post-stent angioplasty was performed. 3.5 mm [place] balloon Maximum pressure: 16 atm. The post-interventional distal flow is normal (TIMI 3). The intervention was  successful. No complications occurred at this lesion.  . Supplies used: STENT PROMUS PREM MR 3.5X16  . There is no residual stenosis post intervention.     . First Obtuse Marginal Branch   The vessel is large in size.   . Second Obtuse Marginal Branch   The vessel is small in size.     Right Coronary Artery   . Prox RCA lesion, 30% stenosed. tubular .          Discharge Exam: Blood pressure 135/44, pulse 67, temperature 98 F (36.7 C), temperature source Oral, resp. rate 20, height 5' 6" (1.676 m), weight 184 lb 11.9 oz (83.8 kg), SpO2 98 %. General appearance: alert, cooperative and no distress Neck: no carotid bruit and no JVD Lungs: clear to auscultation bilaterally Heart: regular rate and rhythm, S1, S2 normal, no murmur, click, rub or gallop Extremities: no LEE Pulses: 2+ and symmetric Skin: warm and dry Neurologic: Grossly normal  Disposition: 01-Home or Self Care      Discharge Instructions    Diet - low sodium heart healthy    Complete by:  As directed      Increase activity slowly    Complete by:  As directed             Medication List    STOP taking these medications        celecoxib 200 MG capsule  Commonly known as:  CELEBREX     traMADol 50 MG tablet  Commonly known as:  ULTRAM      TAKE these medications          amLODipine 2.5 MG tablet  Commonly known as:  NORVASC  Take 1 tablet (2.5 mg total) by mouth daily.     aspirin EC 81 MG tablet  Take 1 tablet (81 mg total) by mouth daily.     atenolol 25 MG tablet  Commonly known as:  TENORMIN  Take 1 tablet (25 mg total) by mouth daily.     glyBURIDE 5 MG tablet  Commonly known as:  DIABETA  Take 1 tablet by mouth in AM and 0.5-1 tablet by mouth in PM     isosorbide mononitrate 30 MG 24 hr tablet  Commonly known as:  IMDUR  Take 1 tablet (30 mg total) by mouth daily.     losartan 50 MG tablet  Commonly known as:  COZAAR  Take 1 tablet (50 mg total) by mouth  every evening.     nitroGLYCERIN 0.4 MG SL tablet  Commonly known as:  NITROSTAT  Place 0.4 mg under the tongue every 5 (five) minutes as needed for chest pain (no more than 3 doses).     omeprazole 40 MG capsule  Commonly known as:  PRILOSEC  Take 1 capsule by mouth daily as needed. Heartburn or acid reflux     ticagrelor 90 MG Tabs tablet  Commonly known as:  BRILINTA  Take 1 tablet (90 mg total) by mouth 2 (two) times daily.     ticagrelor 90 MG Tabs tablet  Commonly known as:  BRILINTA  Take 1 tablet (90 mg total) by mouth 2 (two) times daily.       Follow-up Information    Follow up with Peter [name], MD.   Specialty:  Cardiology   Why:  our office will call  you with a follow-up appointment   Contact information:   [address] [place] [address] [phone]     TIME SPENT ON DISCHARGE, INCLUDING PHYSICIAN TIME: >30 MINUTES  Signed: SIMMONS, BRITTAINY 01/23/2015, 9:20 AM   

## 2015-02-11 ENCOUNTER — Encounter: Payer: Self-pay | Admitting: Family Medicine

## 2015-02-11 ENCOUNTER — Ambulatory Visit (INDEPENDENT_AMBULATORY_CARE_PROVIDER_SITE_OTHER): Payer: Medicare Other | Admitting: Family Medicine

## 2015-02-11 VITALS — BP 122/74 | HR 79 | Temp 98.5°F | Wt 176.8 lb

## 2015-02-11 DIAGNOSIS — E041 Nontoxic single thyroid nodule: Secondary | ICD-10-CM

## 2015-02-11 DIAGNOSIS — Z23 Encounter for immunization: Secondary | ICD-10-CM | POA: Diagnosis not present

## 2015-02-11 DIAGNOSIS — S060X0A Concussion without loss of consciousness, initial encounter: Secondary | ICD-10-CM | POA: Diagnosis not present

## 2015-02-11 DIAGNOSIS — I25118 Atherosclerotic heart disease of native coronary artery with other forms of angina pectoris: Secondary | ICD-10-CM

## 2015-02-11 DIAGNOSIS — E1151 Type 2 diabetes mellitus with diabetic peripheral angiopathy without gangrene: Secondary | ICD-10-CM

## 2015-02-11 DIAGNOSIS — E1159 Type 2 diabetes mellitus with other circulatory complications: Secondary | ICD-10-CM

## 2015-02-11 DIAGNOSIS — E042 Nontoxic multinodular goiter: Secondary | ICD-10-CM

## 2015-02-11 NOTE — Assessment & Plan Note (Signed)
Recheck u/s pending.   

## 2015-02-11 NOTE — Progress Notes (Signed)
Pre visit review using our clinic review tool, if applicable. No additional management support is needed unless otherwise documented below in the visit note.  Admitted for stent earlier in 01/2015.  Her CP is resolved.  In the meantime she has had some mild episodic nonexertional SOB and lightheaded episodes in the meantime, which self resolve.  She attributed some of the SOB to the brilinta vs her BP meds.  She is fatigued.    DM2.  Low sugars only if prolonged fasting.  Last A1c controlled. No ADE on meds o/w. Compliant.   She fell dancing about 1 week ago.  No LOC but was dazed from the event.  She had a knot on her head after the event.  Went to ER at [facility] affiliated facility- CT reports: CT of the head: Large right posterior parietal scalp hematoma. No acute intracranial abnormality is noted. Chronic ischemic changes. CT of the cervical spine: Degenerative changes and postsurgical changes. No acute abnormality is noted. 13 mm left thyroid nodule. Nonemergent outpatient thyroid ultrasoundis recommended for further evaluation. All d/w pt.  Still with a HA.  Had been given hydrocodone rx at ER.  No focal neuro changes in the meantime.  She hasn't been given concussion protocol for activity, etc.    PMH and SH reviewed  ROS: See HPI, otherwise noncontributory.  Meds, vitals, and allergies reviewed.   nad ncat except for resolving hematoma on the R scalp. R scalp with tender area noted.  Small amount of scabbing noted.  PERRL, EOMI Thyroid not ttp Neck supple, no LA rrr ctab  abd soft CN 2-12 wnl B, S/S/DTR wnl x4

## 2015-02-11 NOTE — Assessment & Plan Note (Signed)
CP resolved, unclear if episodic nonexertional SOB and lightheaded are from low BP (noted that the SOB started with first dose of brilinta before the cath).  Will stop CCB and she'll update me.  Okay for outpatient f/u.

## 2015-02-11 NOTE — Assessment & Plan Note (Signed)
Can try tylenol now- she has tolerated it in the hydrocodone rx.   D/w pt about concussion path/phys and protocol, re: avoiding stimuli.  Will update me in a few days.  Imaging d/w pt, reviewed reports as above.  Okay for outpatient f/u.

## 2015-02-11 NOTE — Patient Instructions (Addendum)
Stop the amlodipine and update me next week.   Concussion treatment: "Shut it down."  No music, noise.   Tylenol for the headache.  Shirlee Limerick will call about your referral.  Take care.  Glad to see you.

## 2015-02-11 NOTE — Assessment & Plan Note (Signed)
A1c at goal.  Continue as is.  She has tolerated her current DM2 med better than anything prev tried.

## 2015-02-15 ENCOUNTER — Encounter: Payer: Medicare Other | Admitting: Physician Assistant

## 2015-02-17 ENCOUNTER — Telehealth: Payer: Self-pay

## 2015-02-17 NOTE — Telephone Encounter (Signed)
Pt left v/m; pt seen 02/11/15 with concussion;pt was to cb with update; pt still has h/a (pain level now 5). Intensity of h/a comes and goes now; does not hurt consistently like before. knot has gone down slightly since seen. Pt taking advil and that has helped with h/a. No vision problems.Pt continuing no stimulus environment and pt said that does help how she feels.

## 2015-02-17 NOTE — Telephone Encounter (Signed)
Patient notified as instructed by telephone and verbalized understanding. 

## 2015-02-17 NOTE — Telephone Encounter (Signed)
If she has made that much progress in the meantime, then she should continue to improve.   I would try to keep going as is and update me in about 1 week.  Glad she is better.  Thanks for the update.

## 2015-02-18 ENCOUNTER — Ambulatory Visit
Admission: RE | Admit: 2015-02-18 | Discharge: 2015-02-18 | Disposition: A | Discharge status: Home / Self Care | Payer: Medicare Other | Source: Ambulatory Visit | Attending: Family Medicine | Admitting: Family Medicine

## 2015-02-18 DIAGNOSIS — E041 Nontoxic single thyroid nodule: Secondary | ICD-10-CM

## 2015-02-24 ENCOUNTER — Other Ambulatory Visit: Payer: Self-pay | Admitting: Family Medicine

## 2015-02-24 ENCOUNTER — Telehealth: Payer: Self-pay | Admitting: Cardiology

## 2015-02-24 DIAGNOSIS — E042 Nontoxic multinodular goiter: Secondary | ICD-10-CM

## 2015-02-24 NOTE — Telephone Encounter (Signed)
Pt on Brilinta- not anticoagulants.  Will send to Dr. SwazilandJordan for approval.

## 2015-02-24 NOTE — Telephone Encounter (Signed)
This is Dr. Fabio BeringNishan's patient. Unless I am missing something she is just on ASA 81 mg daily. She is OK to hold this for 5 days for thyroid bx.  Peter SwazilandJordan MD, [facility]

## 2015-02-24 NOTE — Telephone Encounter (Signed)
She may hold brilinta for 2 days prior to biopsy  Peter SwazilandJordan MD, [facility]

## 2015-02-24 NOTE — Telephone Encounter (Signed)
Request for stop/start on anticoagulants for thyroid biopsy.

## 2015-02-24 NOTE — Telephone Encounter (Signed)
Maria [name] is calling because she is needing the patient to hold the blood thinner for 5 days because she is having a Thyroid Biopsy . Please Fax over note [phone]) or put in Epic if this is ok .[name].  Thanks

## 2015-02-25 NOTE — Telephone Encounter (Signed)
[facility]

## 2015-02-26 ENCOUNTER — Telehealth: Payer: Self-pay | Admitting: Cardiology

## 2015-02-26 NOTE — Telephone Encounter (Signed)
Received a call from Augusta SpringsPamela with [facility].Patient scheduled for thyroid biopsy and patient needs to hold Brilinta 5 days prior to biopsy.Stated she received message back from Dr.Jordan to hold for 2 days.She wanted to ask Dr.Jordan if ok to hold for 5 days.Message sent to Dr.Jordan for advice.

## 2015-02-26 NOTE — Telephone Encounter (Signed)
Returned call to RositaPamela with [facility].Dr.Jordan advised not necessary to hold brilanta 5 days,half life much shorter.Only need to hold 2 days prior to any surgery.Note faxed to fax # [phone].

## 2015-02-26 NOTE — Telephone Encounter (Signed)
It is not necessary to hold for 5 days. It is not coumadin and its half life is much shorter. 2 days is what we recommend for any surgery.  Peter SwazilandJordan MD, [facility]

## 2015-03-01 ENCOUNTER — Encounter: Payer: Self-pay | Admitting: Physician Assistant

## 2015-03-01 NOTE — Progress Notes (Addendum)
Cardiology Office Note Date:  03/03/2015  Patient ID:  Maria [name], DOB [birth year], MRN [id number] PCP:  Crawford GivensGraham Duncan, MD  Cardiologist:  Eden EmmsNishan   Chief Complaint: f/u stenting  History of Present Illness: Maria ManuelBarbara D Wineland is a 74 y.o. female with history of [facility]), HTN, DM, GERD, HLD (intolerant of multiple statins), SVT s/p ablation 06/2014 who presents for post-hospital follow-up.   Per review of history, she underwent catheter ablation of a parahisian atrial tachycardia which was successfully ablated from the non-coronary cusp of the aortic root. She initially did well after her ablation but then began to experience dizziness, SOB, and chest heaviness. Event monitor showed pauses so beta blocker was stopped then restarted at lower dose. She had an abnormal stress test notable for circumflex stenosis that was initially managed medically, LVEF normal She continued to have angina thus was brought in for elective PCI 01/22/15 and [facility]; otherwise had 30% pRCA and 35% D1. DAPT was recommended for 1 year. HR and BP remained stable on current regimen.  She comes in today for post-cath follow-up. She has not had any further chest pain or dyspnea. She does continue to have intermittent dizziness when up and moving around. She has labels on all of her heart medicines that state "MAY CAUSE DIZZINESS" and she is worried they are contributing. She has not had any falls, pre-syncope or syncope but is afraid of falling. She has a thyroid biopsy coming up next week. There are phone notes going back and forth between Dr. SwazilandJordan (who did PCI) and nurse, where Dr. SwazilandJordan cleared patient to come off Brilinta x 2 days for the procedure. However, the patient has a fresh stent. I clarified with Dr. Eden EmmsNishan today that his preference would be that she NOT stop Brilinta at this time for this procedure. He is OK with her having the procedure if it can be done on dual antiplatelet  therapy, but she needs to continue ASA + Brilinta uninterrupted. He says he has been communicating with Dr. Lianne Bushyuncan's office regarding this - their communication can be found under the Thyroid Ultrasound result under "imaging."   Past Medical History  Diagnosis Date  . Hypertension   . Hyperlipidemia   . GERD (gastroesophageal reflux disease)   . Anemia 05/1985  . Right knee pain 02/2010    Injected - Dr. Shelle IronBeane  . PSVT (paroxysmal supraventricular tachycardia) (HCC)     a. s/p catheter ablation of a parahisian atrial tachycardia which was successfully ablated from the non-coronary cusp of the aortic root 06/2014.  . Fracture of lower leg 07/2001    Right  . Coronary artery disease     a. [facility].  [name]. Pneumonia ?1990's X 1  . Type II diabetes mellitus (HCC) 2002  . DDD (degenerative disc disease), lumbar   . DDD (degenerative disc disease), cervical   . Chronic back pain   . Renal cyst 07/10/2006    bilateral  . Multinodular thyroid   . Syncope and collapse 07/2001    "that's when I broke my leg"  . Sinus pause     a. By event monitoring 09/2014 - BB stopped at that time.    Past Surgical History  Procedure Laterality Date  . Esophagogastroduodenoscopy  08/14/2005    gastropathy biopsy, negative  . Anterior cervical decomp/discectomy fusion  10/03/2005    C4/5; C5/6  "it's got a plate in there"  . Posterior laminectomy /  decompression lumbar spine  09/2001    due to herniated disc  . Supraventricular tachycardia ablation N/A 07/06/2014    Procedure: SUPRAVENTRICULAR TACHYCARDIA ABLATION;  Surgeon: Marinus Maw, MD;  Location: [facility] CATH LAB;  Service: Cardiovascular;  Laterality: N/A;  . Cardiac catheterization N/A 11/18/2014    Procedure: Left Heart Cath and Coronary Angiography;  Surgeon: Peter M Swaziland, MD;  Location: [facility] INVASIVE CV LAB;  Service: Cardiovascular;  Laterality: N/A;  . Cardiac catheterization N/A 01/22/2015    Procedure: Coronary Stent Intervention;   Surgeon: Peter M Swaziland, MD;  Location: [facility] INVASIVE CV LAB;  Service: Cardiovascular;  Laterality: N/A;  . Vaginal hysterectomy  1968    spotting  . Laparoscopic cholecystectomy  08/1992  . Back surgery    . Breast biopsy Bilateral 1968 (multiple)    "all benign"  . Breast lumpectomy Bilateral 1968  . Cataract extraction w/ intraocular lens  implant, bilateral Bilateral 12/2010  . Coronary angioplasty      Current Outpatient Prescriptions  Medication Sig Dispense Refill  . amLODipine (NORVASC) 2.5 MG tablet Take 1 tablet by mouth daily.  6  . aspirin EC 81 MG tablet Take 1 tablet (81 mg total) by mouth daily. 90 tablet 3  . atenolol (TENORMIN) 25 MG tablet Take 1 tablet (25 mg total) by mouth daily. 30 tablet 6  . glyBURIDE (DIABETA) 5 MG tablet Take 1 tablet by mouth in AM and 0.5-1 tablet by mouth in PM 180 tablet 3  . HYDROcodone-acetaminophen (NORCO/VICODIN) 5-325 MG tablet   0  . isosorbide mononitrate (IMDUR) 30 MG 24 hr tablet Take 1 tablet (30 mg total) by mouth daily. 90 tablet 3  . losartan (COZAAR) 50 MG tablet Take 1 tablet (50 mg total) by mouth every evening. 90 tablet 3  . nitroGLYCERIN (NITROSTAT) 0.4 MG SL tablet Place 0.4 mg under the tongue every 5 (five) minutes as needed for chest pain (no more than 3 doses).    [name] omeprazole (PRILOSEC) 40 MG capsule Take 1 capsule by mouth daily as needed. Heartburn or acid reflux    . ticagrelor (BRILINTA) 90 MG TABS tablet Take 1 tablet (90 mg total) by mouth 2 (two) times daily. 60 tablet 10   No current facility-administered medications for this visit.    Allergies:   Acetaminophen; Carvedilol; Crestor; Ezetimibe-simvastatin; Ibuprofen; Insulin glargine; Levemir; Macrobid; Metformin; Metoprolol tartrate; and Naproxen   Social History:  The patient  reports that she has never smoked. She has never used smokeless tobacco. She reports that she does not drink alcohol or use illicit drugs.   Family History:  The patient's family  history includes Depression in her paternal aunt; Diabetes in her brother, maternal grandmother, paternal grandfather, paternal grandmother, and sister; Heart disease in her maternal grandfather and mother; Hypertension in her maternal grandfather, maternal grandmother, mother, paternal grandfather, and paternal grandmother; Myasthenia gravis in her sister; Stroke in her maternal grandmother.  ROS:  Please see the history of present illness. No headaches, visual changes, focal weakness reported. All other systems are reviewed and otherwise negative.   PHYSICAL EXAM:  VS:  BP 130/70 mmHg  Pulse 65  Ht  (1.676 m)  Wt 180 lb (81.647 kg)  BMI 29.07 kg/m2 BMI: Body mass index is 29.07 kg/(m^2). Well nourished, well developed WF, in no acute distress HEENT: normocephalic, atraumatic Neck: no JVD, carotid bruits or masses Cardiac:  normal S1, S2; RRR; no murmurs, rubs, or gallops Lungs:  clear to auscultation bilaterally, no wheezing,  rhonchi or rales Abd: soft, nontender, no hepatomegaly, + BS MS: no deformity or atrophy Ext: no edema, right radial cath site completely healed, no ecchymosis or hematoma; good pulse Skin: warm and dry, no rash Neuro:  moves all extremities spontaneously, no focal abnormalities noted, follows commands Psych: euthymic mood, full affect  EKG:  Done today shows NSR 65bpm, low voltage QRS, no acute changes  Recent Labs: 10/27/2014: Pro B Natriuretic peptide (BNP) 37.0 01/23/2015: BUN 9; Creatinine, Ser 0.98; Hemoglobin 12.3; Platelets 280; Potassium 4.1; Sodium 140  No results found for requested labs within last 365 days.   CrCl cannot be calculated (Patient has no serum creatinine result on file.).   Wt Readings from Last 3 Encounters:  03/03/15 180 lb (81.647 kg)  02/11/15 176 lb 12 oz (80.173 kg)  01/23/15 184 lb 11.9 oz (83.8 kg)     Other studies reviewed: Additional studies/records reviewed today include: summarized above  ASSESSMENT AND  PLAN:  1. CAD s/p PCI as above - doing well post-PCI. As above, she should NOT stop Brilinta at this time. She verbalizes complete understanding. Will forward copy of this note to PCP. Our nurse will also call [facility] to make sure they have the most up to date information. I also asked the patient to call [facility] to clarify our recommendations to make a decision about whether or not to move forward with thyroid biopsy at this time. 2. Dizziness - etiology not totally clear, question medication related. Given h/o prior bradycardia, will further reduce atenolol to 12.5mg  daily. Check carotid duplex. If dizziness persists, may consider neurology evaluation. 3. Essential HTN - presently controlled. Follow with med changes. 4. HLD - refer to lipid clinic given multiple statin intolerances to consider PCSK-9 therapy. 5. Paroxysmal SVT with history of sinus pause on event monitoring - see above.  Disposition: F/u with Dr. [name] in 4 months.  Current medicines are reviewed at length with the patient today.  The patient did not have any concerns regarding medicines.  Thomasene Mohair PA-C 03/03/2015 9:56 AM     [facility] [address] [place] [address] [phone] (office)  [phone] (fax)

## 2015-03-03 ENCOUNTER — Encounter: Payer: Self-pay | Admitting: Physician Assistant

## 2015-03-03 ENCOUNTER — Telehealth: Payer: Self-pay | Admitting: *Deleted

## 2015-03-03 ENCOUNTER — Ambulatory Visit (INDEPENDENT_AMBULATORY_CARE_PROVIDER_SITE_OTHER): Payer: Medicare Other | Admitting: Physician Assistant

## 2015-03-03 VITALS — BP 130/70 | HR 65 | Ht 66.0 in | Wt 180.0 lb

## 2015-03-03 DIAGNOSIS — Z9861 Coronary angioplasty status: Secondary | ICD-10-CM | POA: Diagnosis not present

## 2015-03-03 DIAGNOSIS — I10 Essential (primary) hypertension: Secondary | ICD-10-CM

## 2015-03-03 DIAGNOSIS — I471 Supraventricular tachycardia: Secondary | ICD-10-CM | POA: Diagnosis not present

## 2015-03-03 DIAGNOSIS — R42 Dizziness and giddiness: Secondary | ICD-10-CM

## 2015-03-03 DIAGNOSIS — E785 Hyperlipidemia, unspecified: Secondary | ICD-10-CM

## 2015-03-03 DIAGNOSIS — I2510 Atherosclerotic heart disease of native coronary artery without angina pectoris: Secondary | ICD-10-CM | POA: Diagnosis not present

## 2015-03-03 MED ORDER — ATENOLOL 25 MG PO TABS: 12.5000 mg | ORAL_TABLET | Freq: Every day | ORAL | Status: DC

## 2015-03-03 NOTE — Patient Instructions (Addendum)
Medication Instructions:  Your physician has recommended you make the following change in your medication:  1.  DECREASE the Atenolol to 25 mg taking 1/2 tablet a day.   DO NOT STOP BRILINTA  Labwork: None ordered  Testing/Procedures: Your physician has requested that you have a carotid duplex. This test is an ultrasound of the carotid arteries in your neck. It looks at blood flow through these arteries that supply the brain with blood. Allow one hour for this exam. There are no restrictions or special instructions.    Follow-Up: Your physician recommends that you schedule a follow-up appointment : The 1st available with Orma FlamingSally, Pharm D for PCSK-9 (Lipid Clinic)  And also in 4 MONTHS WITH DR. Eden EmmsNISHAN, which you will receive a letter when it is time for you to call and get scheduled to see him in February 2017.  Any Other Special Instructions Will Be Listed Below (If Applicable).

## 2015-03-03 NOTE — Telephone Encounter (Signed)
Pt is scheduled for US Thyroid Bx 03-10-15.  Pt cannot stay off of Brilinta due to having a stent placed.  [facility] and made her aware that pt cannot.  She verbalized understanding.

## 2015-03-09 ENCOUNTER — Ambulatory Visit (INDEPENDENT_AMBULATORY_CARE_PROVIDER_SITE_OTHER): Payer: Medicare Other | Admitting: Pharmacist Clinician (PhC)/ Clinical Pharmacy Specialist

## 2015-03-09 ENCOUNTER — Encounter: Payer: Self-pay | Admitting: Pharmacist Clinician (PhC)/ Clinical Pharmacy Specialist

## 2015-03-09 DIAGNOSIS — E785 Hyperlipidemia, unspecified: Secondary | ICD-10-CM | POA: Diagnosis not present

## 2015-03-09 DIAGNOSIS — Z5181 Encounter for therapeutic drug level monitoring: Secondary | ICD-10-CM

## 2015-03-09 LAB — LIPID PANEL
CHOLESTEROL: 172 mg/dL (ref 125–200)
HDL: 28 mg/dL — ABNORMAL LOW (ref >=46)
LDL Cholesterol: 122 mg/dL (ref <130)
Total CHOL/HDL Ratio: 6.1 Ratio — ABNORMAL HIGH (ref <=5.0)
Triglycerides: 111 mg/dL (ref <150)
VLDL: 22 mg/dL (ref <30)

## 2015-03-09 LAB — HEPATIC FUNCTION PANEL
ALT: 16 U/L (ref 6–29)
AST: 16 U/L (ref 10–35)
Albumin: 3.7 g/dL (ref 3.6–5.1)
Alkaline Phosphatase: 85 U/L (ref 33–130)
BILIRUBIN DIRECT: 0.1 mg/dL (ref <=0.2)
BILIRUBIN INDIRECT: 0.5 mg/dL (ref 0.2–1.2)
Total Bilirubin: 0.6 mg/dL (ref 0.2–1.2)
Total Protein: 6.5 g/dL (ref 6.1–8.1)

## 2015-03-09 NOTE — Progress Notes (Signed)
Fm hx - mom pacemaker, maternal aunt with heart disease Brother - dm Sister - 0 probs  Dances 1-2 x per week; works in yard  Concussion from fall last month while dancing, wants to get clearance for cardiac rehab  PCI with stent 01-23-15  No alcohol, never smoke  03/09/2015 ANISHKA BUSHARD 1940-11-18 [id number]   HPI:  TMYA WIGINGTON is a 74 y.o. female patient of Dr [name], who presents today for a lipid clinic evaluation.  She has a long history of elevated LDL, having tried multiple statins.  She recently (Sept 2016) underwent PCI with a stent placed.  She is hoping to start cardiac rehab in the next week or two.  Unfortunately her start in rehab has been delayed due to her suffering a concussion after a fall, while dancing.  Concussion symptoms have mostly resolved, although she still reports occasional dizziness.    RF:  PCI with stent x 1 (9-16), DM, HTN  Meds: none currently   Intolerant: rosuvastatin (even just MWF dosing) and simvastatin both caused all over body aches, flu-like feelings   Tried in the past: does recall being on pravastatin, but discontinuing due to lack of insurance coverage   Family history: states mother's family had strong hx of heart disease, none in either sibling  Diet: patient tends not to cook much, eats easy to prepare foods, likes oatmeal and will often eat for breakfast or dinner  Exercise:  Stays quite active, dances both Friday and Saturday nights most weekends, works in her yard.  Frustrated with having to avoid this while concussion healing   Labs:  02/2015: TC 172, TG 111, HDL 28, LDL 122  (no medications)   Current Outpatient Prescriptions  Medication Sig Dispense Refill  . amLODipine (NORVASC) 2.5 MG tablet Take 1 tablet by mouth daily.  6  . aspirin EC 81 MG tablet Take 1 tablet (81 mg total) by mouth daily. 90 tablet 3  . atenolol (TENORMIN) 25 MG tablet Take 0.5 tablets (12.5 mg total) by mouth daily. 45 tablet 3  . glyBURIDE  (DIABETA) 5 MG tablet Take 1 tablet by mouth in AM and 0.5-1 tablet by mouth in PM 180 tablet 3  . isosorbide mononitrate (IMDUR) 30 MG 24 hr tablet Take 1 tablet (30 mg total) by mouth daily. 90 tablet 3  . losartan (COZAAR) 50 MG tablet Take 1 tablet (50 mg total) by mouth every evening. 90 tablet 3  . nitroGLYCERIN (NITROSTAT) 0.4 MG SL tablet Place 0.4 mg under the tongue every 5 (five) minutes as needed for chest pain (no more than 3 doses).    [name] omeprazole (PRILOSEC) 40 MG capsule Take 1 capsule by mouth daily as needed. Heartburn or acid reflux    . ticagrelor (BRILINTA) 90 MG TABS tablet Take 1 tablet (90 mg total) by mouth 2 (two) times daily. 60 tablet 10   No current facility-administered medications for this visit.    Allergies  Allergen Reactions  . Acetaminophen     REACTION: jittery with plain tylenol, but tolerated tylenol/benadryl combination  . Carvedilol     REACTION: nightmares  . Crestor [Rosuvastatin]     aches  . Ezetimibe-Simvastatin     REACTION: muscle aches  . Ibuprofen     REACTION: GI upset  . Insulin Glargine     Rash  . Levemir [Insulin Detemir]     rash  . Macrobid [Nitrofurantoin Macrocrystal] Other (See Comments)    Achey, N & V  .  Metformin     REACTION: GI upset  . Metoprolol Tartrate     REACTION: nightmares  . Naproxen     REACTION: GI upset    Past Medical History  Diagnosis Date  . Hypertension   . Hyperlipidemia   . GERD (gastroesophageal reflux disease)   . Anemia 05/1985  . Right knee pain 02/2010    Injected - Dr. Shelle IronBeane  . PSVT (paroxysmal supraventricular tachycardia) (HCC)     a. s/p catheter ablation of a parahisian atrial tachycardia which was successfully ablated from the non-coronary cusp of the aortic root 06/2014.  . Fracture of lower leg 07/2001    Right  . Coronary artery disease     a. [facility].  [name]. Pneumonia ?1990's X 1  . Type II diabetes mellitus (HCC) 2002  . DDD (degenerative disc disease),  lumbar   . DDD (degenerative disc disease), cervical   . Chronic back pain   . Renal cyst 07/10/2006    bilateral  . Multinodular thyroid   . Syncope and collapse 07/2001    "that's when I broke my leg"  . Sinus pause     a. By event monitoring 09/2014 - BB stopped at that time.    Height 5\' 6"  (1.676 m), weight 179 lb 6.4 oz (81.375 kg).    Phillips HayKristin Azan Maneri PharmD CPP [facility]

## 2015-03-09 NOTE — Patient Instructions (Addendum)
Go to lab today.  We will wait until your labs come back for starting any medications.  Best option might be to re-start pravastatin or try ezetimibe.   Cholesterol Cholesterol is a fat. Your body needs a small amount of cholesterol. Cholesterol may build up in your blood vessels. This increases your chance of having a heart attack or stroke. You cannot feel your cholesterol levels. The only way to know your cholesterol level is high is with a blood test. Keep your test results. Work with your doctor to keep your cholesterol at a good level. WHAT DO THE TEST RESULTS MEAN?  Total cholesterol is how much cholesterol is in your blood.  LDL is bad cholesterol. This is the type that can build up. You want LDL to be low.  HDL is good cholesterol. It cleans your blood vessels and carries LDL away. You want HDL to be high.  Triglycerides are fat that the body can burn for energy or store. WHAT ARE GOOD LEVELS OF CHOLESTEROL?  Total cholesterol below 200.  LDL below 100 for people at risk. Below 70 for those at very high risk.  HDL above 50 is good. Above 60 is best.  Triglycerides below 150. HOW CAN I LOWER MY CHOLESTEROL?  Diet. Follow your diet programs as told by your doctor.  Choose fish, white meat chicken, roasted Malaw[place]urkey, or baked [place]key. Try not to eat red meat, fried foods, or processed meats such as sausage and lunch meats.  Eat lots of fresh fruits and vegetables.  Choose whole grains, beans, pasta, potatoes, and cereals.  Use only small amounts of olive, corn, or canola oils.  Try not to eat butter, mayonnaise, shortening, or palm kernel oils.  Try not to eat foods with trans fats.  Drink skim or nonfat milk. Eat low-fat or nonfat yogurt and cheeses. Try not to drink whole milk or cream. Try not to eat ice cream, egg yolks, and full-fat cheeses.  Healthy desserts include angel food cake, ginger snaps, animal crackers, hard candy, popsicles, and low-fat or nonfat frozen  yogurt. Try not to eat pastries, cakes, pies, and cookies.  Exercise. Follow your exercise programs as told by your doctor.  Be more active. You can try gardening, walking, or taking the stairs. Ask your doctor about how you can be more active.  Medicine. Take medicine as told by your doctor.   This information is not intended to replace advice given to you by your health care provider. Make sure you discuss any questions you have with your health care provider.   Document Released: 07/28/2008 Document Revised: 05/22/2014 Document Reviewed: 02/12/2013 Elsevier Interactive Patient Education [employer]2016 Elsevier Inc.

## 2015-03-10 ENCOUNTER — Inpatient Hospital Stay: Admission: RE | Admit: 2015-03-10 | Payer: Medicare Other | Source: Ambulatory Visit

## 2015-03-10 MED ORDER — PRAVASTATIN SODIUM 20 MG PO TABS: 20.0000 mg | ORAL_TABLET | Freq: Every evening | ORAL | Status: DC

## 2015-03-10 NOTE — Assessment & Plan Note (Signed)
Received labs day after visit.  LDL elevated at 122.  Patient admits has previously done well on pravastatin, but was switched off.  This was several years ago and she believes was due to lack of insurance coverage.  Will restart her today at pravastatin 20 mg once daily at bedtime.  Also reviewed dietary modifications to help with cholesterol lowering.   Patient will try to incorporate these into her daily living as well.  Will have her repeat labs in 3 months and follow up with us after that.  She had previous problems with Vytorin, but can consider adding Zetia in January, or increasing pravastatin.

## 2015-03-11 ENCOUNTER — Ambulatory Visit (HOSPITAL_COMMUNITY)
Admission: RE | Admit: 2015-03-11 | Discharge: 2015-03-11 | Disposition: A | Discharge status: Home / Self Care | Payer: Medicare Other | Source: Ambulatory Visit | Attending: Physician Assistant | Admitting: Physician Assistant

## 2015-03-11 DIAGNOSIS — I10 Essential (primary) hypertension: Secondary | ICD-10-CM | POA: Diagnosis not present

## 2015-03-11 DIAGNOSIS — I6523 Occlusion and stenosis of bilateral carotid arteries: Secondary | ICD-10-CM | POA: Insufficient documentation

## 2015-03-11 DIAGNOSIS — R42 Dizziness and giddiness: Secondary | ICD-10-CM | POA: Diagnosis present

## 2015-03-11 DIAGNOSIS — E785 Hyperlipidemia, unspecified: Secondary | ICD-10-CM | POA: Diagnosis not present

## 2015-03-11 DIAGNOSIS — E119 Type 2 diabetes mellitus without complications: Secondary | ICD-10-CM | POA: Insufficient documentation

## 2015-03-11 HISTORY — DX: Peripheral vascular disease, unspecified: I73.9

## 2015-03-11 HISTORY — DX: Disorder of arteries and arterioles, unspecified: I77.9

## 2015-03-12 ENCOUNTER — Telehealth: Payer: Self-pay | Admitting: Cardiovascular Disease

## 2015-03-12 ENCOUNTER — Encounter (HOSPITAL_COMMUNITY): Payer: Self-pay

## 2015-03-12 DIAGNOSIS — I779 Disorder of arteries and arterioles, unspecified: Secondary | ICD-10-CM | POA: Insufficient documentation

## 2015-03-12 DIAGNOSIS — I739 Peripheral vascular disease, unspecified: Secondary | ICD-10-CM

## 2015-03-12 NOTE — Telephone Encounter (Signed)
New message ° ° ° ° ° °Returning a nurses call to get test results °

## 2015-03-12 NOTE — Telephone Encounter (Signed)
Informed patient of carotid study results per Ronie Spiesayna Dunn.

## 2015-03-24 ENCOUNTER — Encounter: Payer: Self-pay | Admitting: Family Medicine

## 2015-03-24 ENCOUNTER — Ambulatory Visit (INDEPENDENT_AMBULATORY_CARE_PROVIDER_SITE_OTHER): Payer: Medicare Other | Admitting: Family Medicine

## 2015-03-24 VITALS — BP 122/72 | HR 74 | Temp 98.3°F | Wt 180.5 lb

## 2015-03-24 DIAGNOSIS — S060X0D Concussion without loss of consciousness, subsequent encounter: Secondary | ICD-10-CM | POA: Diagnosis not present

## 2015-03-24 MED ORDER — TRAMADOL HCL 50 MG PO TABS: 50.0000 mg | ORAL_TABLET | Freq: Two times a day (BID) | ORAL | Status: DC | PRN

## 2015-03-24 NOTE — Progress Notes (Signed)
Pre visit review using our clinic review tool, if applicable. No additional management support is needed unless otherwise documented below in the visit note.  She has some aches with pravastatin and is going to f/u with cards if she can't tolerate it at all.    We talked about f/u u/s in 1 year re: her thyroid.    She is about 6 weeks out from the initial event.  She wanted to talk about her current sx and if that was related to her previous concussion.  She has B ear pain, since the fall, more likely on the R>L side but not always constant.  This isn't going on from the statin use.  The HA are better but not fully resolved.  She has taken tramadol for pain with some relief, w/o ADE.  It works better than advil.  Lights can occ bother her, but may be from prev lens implants.  HA is worse on day when she is really busy, worse with noise- improved but better.  No weakness, no tingling.  Some dizzy sensation, unclear if that is from her current meds vs concussion.  She is clearly better on all sx, but resolving slowly.    Meds, vitals, and allergies reviewed.   ROS: See HPI.  Otherwise, noncontributory.  GEN: nad, alert and oriented HEENT: mucous membranes moist, tm wnl, nasal and OP exam wnl, PERRL EOMI NECK: supple w/o LA CV: rrr. PULM: ctab, no inc wob ABD: soft, +bs EXT: no edema CN 2-12 wnl B, S/S/DTR wnl x4

## 2015-03-24 NOTE — Patient Instructions (Signed)
Take tramadol as needed.  I think your residual symptoms are from the concussion.  As long as you rest and this gets gradually better and resolves, then we don't need to do anything else.   Take care.  Glad to see you.

## 2015-03-25 NOTE — Assessment & Plan Note (Signed)
She is clearly better on all sx, but resolving slowly.   Likely with her sx coming from the concussion.  Okay to take tramadol prn for HA.  As long as she continues to make some improvement, then we won't change anything else.  Dx and tx d/w pt.  Rest and limiting stimuli d/w pt.  Okay for outpatient f/u. >25 minutes spent in face to face time with patient, >50% spent in counselling or coordination of care.

## 2015-05-18 ENCOUNTER — Other Ambulatory Visit: Payer: Self-pay | Admitting: Cardiovascular Disease

## 2015-05-20 ENCOUNTER — Telehealth: Payer: Self-pay | Admitting: Cardiovascular Disease

## 2015-05-20 NOTE — Telephone Encounter (Signed)
New Messaage  Pt requested to speak w/ RN concerning a report that was sent from her gastroenterologist to Dr Eden Emmsnishan. Please call back and discuss.

## 2015-05-20 NOTE — Telephone Encounter (Signed)
Left message to call back  

## 2015-05-31 NOTE — Telephone Encounter (Signed)
Patient has been having chest discomfort after meals and between meals, so she saw a gastroenterologist. Patient states that her gastroenterologist Dr. Matthias HughsBuccini recommends her to follows up with her cardiologist, Dr. Eden EmmsNishan, sooner than later with her chest pain. Patient is due for a follow-up appointment in February, rescheduled for tomorrow, due to recent cancellation. Patient verbalized understanding. Dr. Buccini's office note is in patient's chart.

## 2015-05-31 NOTE — Progress Notes (Signed)
Patient ID: Maria [name], female   DOB: 07/29/1940, 74 y.o.   MRN: [id number]    Cardiology Office Note Date:  06/01/2015  Patient ID:  Maria [name], DOB 10/13/1940, MRN [id number] PCP:  Graham Duncan, MD  Cardiologist:  Zyshawn Bohnenkamp   Chief Complaint: f/u stenting  History of Present Illness: Maria [name] is a 74 y.o. female with history of CAD (s/p DES to mLCx 01/2015), HTN, DM, GERD, HLD (intolerant of multiple statins), SVT s/p ablation 06/2014   Per review of history, she underwent catheter ablation of a parahisian atrial tachycardia which was successfully ablated from the non-coronary cusp of the aortic root. She initially did well after her ablation but then began to experience dizziness, SOB, and chest heaviness. Event monitor showed pauses so beta blocker was stopped then restarted at lower dose. She had an abnormal stress test notable for circumflex stenosis that was initially managed medically, LVEF normal She continued to have angina thus was brought in for elective PCI 01/22/15 and underwent DES to mLCx; otherwise had 30% pRCA and 35% D1. DAPT was recommended for 1 year. HR and BP remained stable on current regimen.  Some chest pain with eating seen by GI Buccini History of GERD  Bilateral thyroid nodules by US 02/2015 stable Plan to possibly biopsy after DAT for a year    Past Medical History  Diagnosis Date  . Hypertension   . Hyperlipidemia   . GERD (gastroesophageal reflux disease)   . Anemia 05/1985  . Right knee pain 02/2010    Injected - Dr. Beane  . PSVT (paroxysmal supraventricular tachycardia) (HCC)     a. s/p catheter ablation of a parahisian atrial tachycardia which was successfully ablated from the non-coronary cusp of the aortic root 06/2014.  . Fracture of lower leg 07/2001    Right  . Coronary artery disease     a. s/p DES to mLCx 01/2015.  . Pneumonia ?1990's X 1  . Type II diabetes mellitus (HCC) 2002  . DDD (degenerative disc disease), lumbar   . DDD  (degenerative disc disease), cervical   . Chronic back pain   . Renal cyst 07/10/2006    bilateral  . Multinodular thyroid   . Syncope and collapse 07/2001    "that's when I broke my leg"  . Sinus pause     a. By event monitoring 09/2014 - BB stopped at that time.  . Carotid artery disease (HCC)     a. 02/2015 - 1-39% bilaterally.    Past Surgical History  Procedure Laterality Date  . Esophagogastroduodenoscopy  08/14/2005    gastropathy biopsy, negative  . Anterior cervical decomp/discectomy fusion  10/03/2005    C4/5; C5/6  "it's got a plate in there"  . Posterior laminectomy / decompression lumbar spine  09/2001    due to herniated disc  . Supraventricular tachycardia ablation N/A 07/06/2014    Procedure: SUPRAVENTRICULAR TACHYCARDIA ABLATION;  Surgeon: Gregg W Taylor, MD;  Location: [facility] CATH LAB;  Service: Cardiovascular;  Laterality: N/A;  . Cardiac catheterization N/A 11/18/2014    Procedure: Left Heart Cath and Coronary Angiography;  Surgeon: Mandy Peeks M Jordan, MD;  Location: [facility] INVASIVE CV LAB;  Service: Cardiovascular;  Laterality: N/A;  . Cardiac catheterization N/A 01/22/2015    Procedure: Coronary Stent Intervention;  Surgeon: Edlin Ford M Jordan, MD;  Location: [facility] INVASIVE CV LAB;  Service: Cardiovascular;  Laterality: N/A;  . Vaginal hysterectomy  1968    spotting  . Laparoscopic cholecystectomy  08/1992  .   Back surgery    . Breast biopsy Bilateral 1968 (multiple)    "all benign"  . Breast lumpectomy Bilateral 1968  . Cataract extraction w/ intraocular lens  implant, bilateral Bilateral 12/2010  . Coronary angioplasty      Current Outpatient Prescriptions  Medication Sig Dispense Refill  . amLODipine (NORVASC) 2.5 MG tablet Take 1 tablet by mouth daily.  6  . aspirin EC 81 MG tablet Take 1 tablet (81 mg total) by mouth daily. 90 tablet 3  . atenolol (TENORMIN) 25 MG tablet Take 0.5 tablets (12.5 mg total) by mouth daily. 45 tablet 3  . glyBURIDE (DIABETA) 5 MG tablet Take 1  tablet by mouth in AM and 0.5-1 tablet by mouth in PM 180 tablet 3  . isosorbide mononitrate (IMDUR) 30 MG 24 hr tablet Take 1 tablet (30 mg total) by mouth daily. 90 tablet 3  . losartan (COZAAR) 50 MG tablet TAKE 1 TABLET (50 MG TOTAL) BY MOUTH EVERY EVENING. 90 tablet 2  . nitroGLYCERIN (NITROSTAT) 0.4 MG SL tablet Place 0.4 mg under the tongue every 5 (five) minutes as needed for chest pain (no more than 3 doses).    . omeprazole (PRILOSEC) 40 MG capsule Take 1 capsule by mouth daily as needed. Heartburn or acid reflux    . pravastatin (PRAVACHOL) 20 MG tablet Take 1 tablet (20 mg total) by mouth every evening. 30 tablet 5  . ticagrelor (BRILINTA) 90 MG TABS tablet Take 1 tablet (90 mg total) by mouth 2 (two) times daily. 60 tablet 10  . traMADol (ULTRAM) 50 MG tablet Take 1 tablet (50 mg total) by mouth every 12 (twelve) hours as needed (sedation caution). 60 tablet 1   No current facility-administered medications for this visit.    Allergies:   Acetaminophen; Carvedilol; Crestor; Ezetimibe-simvastatin; Ibuprofen; Insulin glargine; Levemir; Macrobid; Metformin; Metoprolol tartrate; and Naproxen   Social History:  The patient  reports that she has never smoked. She has never used smokeless tobacco. She reports that she does not drink alcohol or use illicit drugs.   Family History:  The patient's family history includes Depression in her paternal aunt; Diabetes in her brother, maternal grandmother, paternal grandfather, paternal grandmother, and sister; Heart disease in her maternal grandfather and mother; Hypertension in her maternal grandfather, maternal grandmother, mother, paternal grandfather, and paternal grandmother; Myasthenia gravis in her sister; Stroke in her maternal grandmother.  ROS:  Please see the history of present illness. No headaches, visual changes, focal weakness reported. All other systems are reviewed and otherwise negative.   PHYSICAL EXAM:  VS:  BP 122/66 mmHg   Pulse 79  Ht 5' 6" (1.676 m)  Wt 79.833 kg (176 lb)  BMI 28.42 kg/m2  SpO2 97% BMI: Body mass index is 28.42 kg/(m^2). Well nourished, well developed WF, in no acute distress HEENT: normocephalic, atraumatic Neck: no JVD, carotid bruits or masses Cardiac:  normal S1, S2; RRR; no murmurs, rubs, or gallops Lungs:  clear to auscultation bilaterally, no wheezing, rhonchi or rales Abd: soft, nontender, no hepatomegaly, + BS MS: no deformity or atrophy Ext: no edema, right radial cath site completely healed, no ecchymosis or hematoma; good pulse Skin: warm and dry, no rash Neuro:  moves all extremities spontaneously, no focal abnormalities noted, follows commands Psych: euthymic mood, full affect  EKG:  Done today shows NSR 65bpm, low voltage QRS, no acute changes  Recent Labs: 10/27/2014: Pro B Natriuretic peptide (BNP) 37.0 01/23/2015: BUN 9; Creatinine, Ser 0.98; Hemoglobin 12.3; Platelets 280;   Potassium 4.1; Sodium 140 03/09/2015: ALT 16  03/09/2015: Cholesterol 172; HDL 28*; LDL Cholesterol 122; Total CHOL/HDL Ratio 6.1*; Triglycerides 111; VLDL 22   CrCl cannot be calculated (Patient has no serum creatinine result on file.).   Wt Readings from Last 3 Encounters:  06/01/15 79.833 kg (176 lb)  03/24/15 81.874 kg (180 lb 8 oz)  03/09/15 81.375 kg (179 lb 6.4 oz)     Other studies reviewed: Additional studies/records reviewed today include: summarized above  ASSESSMENT AND PLAN:  1. CAD s/p PCI as above - chest pain not clear if it is GI related or cardiac Given recent stent DM and lower sensitivity of myovue for circumflex disease favor cath She is ok with this Risks discussed willing to proceed. Scheduled for 1/26 with Dr Jordan who did her stent in September  She wants to change to plavix / or effient If stent patient as Brillinta makes her dizzy and dyspnic 2. Dizziness - etiology not totally clear, question medication related. Given h/o prior bradycardia, will further reduce  atenolol to 12.5mg daily. Check carotid duplex. If dizziness persists, may consider neurology evaluation. 3. Essential HTN - presently controlled. Follow with med changes.  She wants to stop norvasc and imdur if stent patent  4. HLD - refer to lipid clinic given multiple statin intolerances to consider [facility] therapy. 5. Paroxysmal SVT with history of sinus pause on event monitoring - see above. 6. Thyroid nodule:  Disposition: F/u with me post cath   Lab called Pre cath orders done Labs next week   If stent patent d/c imdur / amlodipine and change Brillinta to Effient or Plavix  Current medicines are reviewed at length with the patient today.  The patient did not have any concerns regarding medicines.  Illya Gienger    

## 2015-05-31 NOTE — Telephone Encounter (Signed)
F/u   Pt request call back from previous message and stated she didn't receive a call message. Please call pt.

## 2015-06-01 ENCOUNTER — Ambulatory Visit (INDEPENDENT_AMBULATORY_CARE_PROVIDER_SITE_OTHER): Payer: Medicare Other | Admitting: Cardiovascular Disease

## 2015-06-01 ENCOUNTER — Encounter: Payer: Self-pay | Admitting: Cardiovascular Disease

## 2015-06-01 ENCOUNTER — Other Ambulatory Visit (INDEPENDENT_AMBULATORY_CARE_PROVIDER_SITE_OTHER): Payer: Medicare Other | Admitting: *Deleted

## 2015-06-01 VITALS — BP 122/66 | HR 79 | Ht 66.0 in | Wt 176.0 lb

## 2015-06-01 DIAGNOSIS — Z01812 Encounter for preprocedural laboratory examination: Secondary | ICD-10-CM

## 2015-06-01 DIAGNOSIS — Z09 Encounter for follow-up examination after completed treatment for conditions other than malignant neoplasm: Secondary | ICD-10-CM | POA: Diagnosis not present

## 2015-06-01 LAB — CBC WITH DIFFERENTIAL/PLATELET
BASOS PCT: 0 % (ref 0–1)
Basophils Absolute: 0 10*3/uL (ref 0.0–0.1)
EOS ABS: 0.7 10*3/uL (ref 0.0–0.7)
EOS PCT: 10 % — AB (ref 0–5)
HEMATOCRIT: 36.5 % (ref 36.0–46.0)
Hemoglobin: 12.6 g/dL (ref 12.0–15.0)
Lymphocytes Relative: 23 % (ref 12–46)
Lymphs Abs: 1.6 10*3/uL (ref 0.7–4.0)
MCH: 30.4 pg (ref 26.0–34.0)
MCHC: 34.5 g/dL (ref 30.0–36.0)
MCV: 88.2 fL (ref 78.0–100.0)
MONO ABS: 0.3 10*3/uL (ref 0.1–1.0)
MPV: 8.9 fL (ref 8.6–12.4)
Monocytes Relative: 5 % (ref 3–12)
Neutro Abs: 4.3 10*3/uL (ref 1.7–7.7)
Neutrophils Relative %: 62 % (ref 43–77)
Platelets: 347 10*3/uL (ref 150–400)
RBC: 4.14 MIL/uL (ref 3.87–5.11)
RDW: 13.5 % (ref 11.5–15.5)
WBC: 6.9 10*3/uL (ref 4.0–10.5)

## 2015-06-01 LAB — PROTIME-INR
INR: 1.01 (ref <1.50)
PROTHROMBIN TIME: 13.4 s (ref 11.6–15.2)

## 2015-06-01 LAB — BASIC METABOLIC PANEL
BUN: 11 mg/dL (ref 7–25)
CALCIUM: 9.3 mg/dL (ref 8.6–10.4)
CO2: 24 mmol/L (ref 20–31)
CREATININE: 0.87 mg/dL (ref 0.60–0.93)
Chloride: 106 mmol/L (ref 98–110)
Glucose, Bld: 146 mg/dL — ABNORMAL HIGH (ref 65–99)
Potassium: 4.4 mmol/L (ref 3.5–5.3)
Sodium: 141 mmol/L (ref 135–146)

## 2015-06-01 NOTE — Patient Instructions (Addendum)
Medication Instructions:  Your physician recommends that you continue on your current medications as directed. Please refer to the Current Medication list given to you today.  Lab work: Your physician recommends that you return for lab work on 06/07/2015 BMET, INR/PT, CBC  Testing/Procedures: Your physician has requested that you have a cardiac catheterization. Cardiac catheterization is used to diagnose and/or treat various heart conditions. Doctors may recommend this procedure for a number of different reasons. The most common reason is to evaluate chest pain. Chest pain can be a symptom of coronary artery disease (CAD), and cardiac catheterization can show whether plaque is narrowing or blocking your heart's arteries. This procedure is also used to evaluate the valves, as well as measure the blood flow and oxygen levels in different parts of your heart. For further information please visit [email or web address] Please follow instruction sheet, as given.  Follow-Up: Your physician wants you to follow-up in: as directed after cardiac catheterization.  If you need a refill on your cardiac medications before your next appointment, please call your pharmacy.

## 2015-06-02 ENCOUNTER — Other Ambulatory Visit: Payer: Medicare Other

## 2015-06-09 ENCOUNTER — Other Ambulatory Visit (INDEPENDENT_AMBULATORY_CARE_PROVIDER_SITE_OTHER): Payer: Medicare Other | Admitting: *Deleted

## 2015-06-09 ENCOUNTER — Ambulatory Visit (INDEPENDENT_AMBULATORY_CARE_PROVIDER_SITE_OTHER): Payer: Medicare Other | Admitting: Pharmacist

## 2015-06-09 DIAGNOSIS — Z01812 Encounter for preprocedural laboratory examination: Secondary | ICD-10-CM

## 2015-06-09 DIAGNOSIS — I25118 Atherosclerotic heart disease of native coronary artery with other forms of angina pectoris: Secondary | ICD-10-CM

## 2015-06-09 LAB — CBC WITH DIFFERENTIAL/PLATELET
BASOS ABS: 0 10*3/uL (ref 0.0–0.1)
Basophils Relative: 0 % (ref 0–1)
EOS ABS: 0.7 10*3/uL (ref 0.0–0.7)
EOS PCT: 10 % — AB (ref 0–5)
HCT: 36.7 % (ref 36.0–46.0)
Hemoglobin: 12.2 g/dL (ref 12.0–15.0)
Lymphocytes Relative: 26 % (ref 12–46)
Lymphs Abs: 1.8 10*3/uL (ref 0.7–4.0)
MCH: 29.7 pg (ref 26.0–34.0)
MCHC: 33.2 g/dL (ref 30.0–36.0)
MCV: 89.3 fL (ref 78.0–100.0)
MPV: 9 fL (ref 8.6–12.4)
Monocytes Absolute: 0.5 10*3/uL (ref 0.1–1.0)
Monocytes Relative: 7 % (ref 3–12)
Neutro Abs: 3.9 10*3/uL (ref 1.7–7.7)
Neutrophils Relative %: 57 % (ref 43–77)
PLATELETS: 369 10*3/uL (ref 150–400)
RBC: 4.11 MIL/uL (ref 3.87–5.11)
RDW: 13.1 % (ref 11.5–15.5)
WBC: 6.8 10*3/uL (ref 4.0–10.5)

## 2015-06-09 LAB — BASIC METABOLIC PANEL
BUN: 18 mg/dL (ref 7–25)
CO2: 20 mmol/L (ref 20–31)
CREATININE: 0.97 mg/dL — AB (ref 0.60–0.93)
Calcium: 9.1 mg/dL (ref 8.6–10.4)
Chloride: 107 mmol/L (ref 98–110)
Glucose, Bld: 191 mg/dL — ABNORMAL HIGH (ref 65–99)
Potassium: 4.4 mmol/L (ref 3.5–5.3)
Sodium: 136 mmol/L (ref 135–146)

## 2015-06-09 LAB — LIPID PANEL
CHOL/HDL RATIO: 5.1 ratio — AB (ref <=5.0)
CHOLESTEROL: 157 mg/dL (ref 125–200)
HDL: 31 mg/dL — ABNORMAL LOW (ref >=46)
LDL Cholesterol: 100 mg/dL (ref <130)
Triglycerides: 130 mg/dL (ref <150)
VLDL: 26 mg/dL (ref <30)

## 2015-06-09 LAB — HEPATIC FUNCTION PANEL
ALBUMIN: 3.9 g/dL (ref 3.6–5.1)
ALT: 14 U/L (ref 6–29)
AST: 15 U/L (ref 10–35)
Alkaline Phosphatase: 95 U/L (ref 33–130)
BILIRUBIN TOTAL: 0.3 mg/dL (ref 0.2–1.2)
Bilirubin, Direct: 0.1 mg/dL (ref <=0.2)
Indirect Bilirubin: 0.2 mg/dL (ref 0.2–1.2)
TOTAL PROTEIN: 6.6 g/dL (ref 6.1–8.1)

## 2015-06-09 LAB — PROTIME-INR
INR: 1 (ref <1.50)
Prothrombin Time: 13.3 seconds (ref 11.6–15.2)

## 2015-06-09 NOTE — Patient Instructions (Signed)
Continue taking Pravastatin  daily. We will call you with the results of your lipid panel. Please call the clinic if you have any issues or questions [phone].

## 2015-06-09 NOTE — Progress Notes (Signed)
Patient ID:  Maria [name]                DOB:  1940/08/16                        MRN:  [id number]     HPI: Maria [name] is a 75 y.o. female patient referred to lipid clinic by Maria [name]. She restarted Pravastatin  in October. She states she has back pain and some muscle pain but this has been unchanged since restarting pravastatin. She stated she was previously on Pravachol and tolerated it well, but stopped it due to the insurance no longer covering it. At that time she was started on Crestor and experienced "horrible muscle pain." Her insurance now covers the pravastatin.   Current Medications: pravastatin  daily Intolerances: Crestor unknown dose - horrible muscle aches Zetia - muscle aches Risk Factors: history of CAD with angina (PCI 01/22/15), HTN, DM LDL goal: <70  Diet: She reports she eats most of her meals at home. She eats red meat occasionally (less than once a week). She eats mostly fruits and vegetables and rarely eats anything fried.   Exercise: She is interested in cardiac rehab and waiting for medical clearance after having a concussion recently. She does walk some.   Family History: Diabetes in her brother, maternal grandmother, paternal grandfather, paternal grandmother, and sister; Heart disease in her maternal grandfather and mother; Hypertension in her maternal grandfather, maternal grandmother, mother, paternal grandfather, and paternal grandmother; Myasthenia gravis in her sister; Stroke in her maternal grandmother.  Social History: The patient  reports that she has never smoked. She has never used smokeless tobacco. She reports that she does not drink alcohol or use illicit drugs.   Labs:  TC: 172   TG: 111   HDL: 28   LDL: 122  New panel after 3 months on pravastatin therapy:  TC: 157   TG: 130   HDL: 26   LDL: 100  Past Medical History  Diagnosis Date  . Hypertension   . Hyperlipidemia   . GERD (gastroesophageal reflux disease)   . Anemia 05/1985    . Right knee pain 02/2010    Injected - Maria [name]  . PSVT (paroxysmal supraventricular tachycardia) (HCC)     a. s/p catheter ablation of a parahisian atrial tachycardia which was successfully ablated from the non-coronary cusp of the aortic root 06/2014.  . Fracture of lower leg 07/2001    Right  . Coronary artery disease     a. s/p DES to [facility] 01/2015.  [name] Pneumonia ?1990's X 1  . Type II diabetes mellitus (HCC) 2002  . DDD (degenerative disc disease), lumbar   . DDD (degenerative disc disease), cervical   . Chronic back pain   . Renal cyst 07/10/2006    bilateral  . Multinodular thyroid   . Syncope and collapse 07/2001    "that's when I broke my leg"  . Sinus pause     a. By event monitoring 09/2014 - BB stopped at that time.  . Carotid artery disease (HCC)     a. 02/2015 - 1-39% bilaterally.    Current Outpatient Prescriptions on File Prior to Visit  Medication Sig Dispense Refill  . amLODipine (NORVASC) 2.5 MG tablet Take 1 tablet by mouth daily.  6  . aspirin EC 81 MG tablet Take 1 tablet (81 mg total) by mouth daily. 90 tablet 3  . atenolol (TENORMIN) 25 MG tablet Take  0.5 tablets (12.5 mg total) by mouth daily. 45 tablet 3  . glyBURIDE (DIABETA) 5 MG tablet Take 1 tablet by mouth in AM and 0.5-1 tablet by mouth in PM 180 tablet 3  . ibuprofen (ADVIL,MOTRIN) 200 MG tablet Take 400 mg by mouth every 8 (eight) hours as needed for mild pain or moderate pain.    . isosorbide mononitrate (IMDUR) 30 MG 24 hr tablet Take 1 tablet (30 mg total) by mouth daily. 90 tablet 3  . losartan (COZAAR) 50 MG tablet TAKE 1 TABLET (50 MG TOTAL) BY MOUTH EVERY EVENING. 90 tablet 2  . nitroGLYCERIN (NITROSTAT) 0.4 MG SL tablet Place 0.4 mg under the tongue every 5 (five) minutes as needed for chest pain (no more than 3 doses).    [name] omeprazole (PRILOSEC) 40 MG capsule Take 1 capsule by mouth daily as needed. Heartburn or acid reflux    . pravastatin (PRAVACHOL) 20 MG tablet Take 1 tablet (20 mg  total) by mouth every evening. 30 tablet 5  . ticagrelor (BRILINTA) 90 MG TABS tablet Take 1 tablet (90 mg total) by mouth 2 (two) times daily. 60 tablet 10  . traMADol (ULTRAM) 50 MG tablet Take 1 tablet (50 mg total) by mouth every 12 (twelve) hours as needed (sedation caution). 60 tablet 1   No current facility-administered medications on file prior to visit.    Allergies  Allergen Reactions  . Acetaminophen     REACTION: jittery with plain tylenol, but tolerated tylenol/benadryl combination  . Carvedilol     REACTION: nightmares  . Crestor [Rosuvastatin]     aches  . Ezetimibe-Simvastatin     REACTION: muscle aches  . Ibuprofen     REACTION: GI upset  . Insulin Glargine     Rash  . Levemir [Insulin Detemir]     rash  . Macrobid [Nitrofurantoin Macrocrystal] Other (See Comments)    Achey, N & V  . Metformin     REACTION: GI upset  . Metoprolol Tartrate     REACTION: nightmares  . Naproxen     REACTION: GI upset    Assessment/Plan: Hyperlipidemia: Lipid panel after 3 months of pravastatin  is improved. Hepatic panel is within normal limits. Per Maria [name] note on labs no medication changes needed. Continue pravastatin  daily. Follow-up with Maria [name].     Thank you, Maria [name]. Maria [name], PharmD  [facility]  [address], [place], [place] [address]  Phone: [phone]; Fax: [phone] 06/09/2015 11:40 AM

## 2015-06-09 NOTE — Addendum Note (Signed)
Addended by: Tonita Phoenix on: 06/09/2015 10:12 AM   Modules accepted: Orders

## 2015-06-09 NOTE — Addendum Note (Signed)
Addended by: BOWDEN, ROBIN K on: 06/09/2015 10:12 AM   Modules accepted: Orders  

## 2015-06-10 ENCOUNTER — Encounter (HOSPITAL_COMMUNITY)
Admission: RE | Disposition: A | Discharge status: Home / Self Care | Payer: Self-pay | Source: Ambulatory Visit | Attending: Cardiology

## 2015-06-10 ENCOUNTER — Ambulatory Visit (HOSPITAL_COMMUNITY)
Admission: RE | Admit: 2015-06-10 | Discharge: 2015-06-10 | Disposition: A | Discharge status: Home / Self Care | Payer: Medicare Other | Source: Ambulatory Visit | Attending: Cardiology | Admitting: Cardiology

## 2015-06-10 DIAGNOSIS — R42 Dizziness and giddiness: Secondary | ICD-10-CM | POA: Insufficient documentation

## 2015-06-10 DIAGNOSIS — M549 Dorsalgia, unspecified: Secondary | ICD-10-CM | POA: Diagnosis not present

## 2015-06-10 DIAGNOSIS — I10 Essential (primary) hypertension: Secondary | ICD-10-CM | POA: Diagnosis not present

## 2015-06-10 DIAGNOSIS — M5030 Other cervical disc degeneration, unspecified cervical region: Secondary | ICD-10-CM | POA: Insufficient documentation

## 2015-06-10 DIAGNOSIS — M5136 Other intervertebral disc degeneration, lumbar region: Secondary | ICD-10-CM | POA: Insufficient documentation

## 2015-06-10 DIAGNOSIS — R072 Precordial pain: Secondary | ICD-10-CM | POA: Diagnosis present

## 2015-06-10 DIAGNOSIS — K219 Gastro-esophageal reflux disease without esophagitis: Secondary | ICD-10-CM | POA: Insufficient documentation

## 2015-06-10 DIAGNOSIS — Z955 Presence of coronary angioplasty implant and graft: Secondary | ICD-10-CM | POA: Diagnosis not present

## 2015-06-10 DIAGNOSIS — I209 Angina pectoris, unspecified: Secondary | ICD-10-CM

## 2015-06-10 DIAGNOSIS — E1159 Type 2 diabetes mellitus with other circulatory complications: Secondary | ICD-10-CM | POA: Diagnosis present

## 2015-06-10 DIAGNOSIS — E041 Nontoxic single thyroid nodule: Secondary | ICD-10-CM | POA: Diagnosis not present

## 2015-06-10 DIAGNOSIS — I471 Supraventricular tachycardia: Secondary | ICD-10-CM | POA: Diagnosis not present

## 2015-06-10 DIAGNOSIS — G8929 Other chronic pain: Secondary | ICD-10-CM | POA: Insufficient documentation

## 2015-06-10 DIAGNOSIS — E119 Type 2 diabetes mellitus without complications: Secondary | ICD-10-CM | POA: Insufficient documentation

## 2015-06-10 DIAGNOSIS — Z7982 Long term (current) use of aspirin: Secondary | ICD-10-CM | POA: Insufficient documentation

## 2015-06-10 DIAGNOSIS — I2510 Atherosclerotic heart disease of native coronary artery without angina pectoris: Secondary | ICD-10-CM | POA: Diagnosis not present

## 2015-06-10 DIAGNOSIS — E785 Hyperlipidemia, unspecified: Secondary | ICD-10-CM | POA: Insufficient documentation

## 2015-06-10 DIAGNOSIS — D649 Anemia, unspecified: Secondary | ICD-10-CM | POA: Insufficient documentation

## 2015-06-10 HISTORY — PX: CARDIAC CATHETERIZATION: SHX172

## 2015-06-10 LAB — GLUCOSE, CAPILLARY
GLUCOSE-CAPILLARY: 142 mg/dL — AB (ref 65–99)
GLUCOSE-CAPILLARY: 118 mg/dL — AB (ref 65–99)

## 2015-06-10 SURGERY — LEFT HEART CATH AND CORONARY ANGIOGRAPHY

## 2015-06-10 MED ORDER — SODIUM CHLORIDE 0.9% FLUSH: 3.0000 mL | Freq: Two times a day (BID) | INTRAVENOUS | Status: DC

## 2015-06-10 MED ORDER — FENTANYL CITRATE (PF) 100 MCG/2ML IJ SOLN
INTRAMUSCULAR | Status: DC | PRN
  Administered 2015-06-10: 25 ug via INTRAVENOUS

## 2015-06-10 MED ORDER — SODIUM CHLORIDE 0.9 % WEIGHT BASED INFUSION: 1.0000 mL/kg/h | INTRAVENOUS | Status: DC

## 2015-06-10 MED ORDER — FENTANYL CITRATE (PF) 100 MCG/2ML IJ SOLN
INTRAMUSCULAR | Status: AC
  Filled 2015-06-10: qty 2

## 2015-06-10 MED ORDER — SODIUM CHLORIDE 0.9 % WEIGHT BASED INFUSION: 3.0000 mL/kg/h | INTRAVENOUS | Status: DC

## 2015-06-10 MED ORDER — SODIUM CHLORIDE 0.9% FLUSH: 3.0000 mL | INTRAVENOUS | Status: DC | PRN

## 2015-06-10 MED ORDER — VERAPAMIL HCL 2.5 MG/ML IV SOLN
INTRAVENOUS | Status: AC
  Filled 2015-06-10: qty 2

## 2015-06-10 MED ORDER — SODIUM CHLORIDE 0.9 % IV SOLN: 250.0000 mL | INTRAVENOUS | Status: DC | PRN

## 2015-06-10 MED ORDER — IBUPROFEN 400 MG PO TABS
400.0000 mg | ORAL_TABLET | Freq: Once | ORAL | Status: AC
  Administered 2015-06-10: 400 mg via ORAL

## 2015-06-10 MED ORDER — IBUPROFEN 800 MG PO TABS
ORAL_TABLET | ORAL | Status: AC
  Filled 2015-06-10: qty 1

## 2015-06-10 MED ORDER — IOHEXOL 350 MG/ML SOLN
INTRAVENOUS | Status: DC | PRN
  Administered 2015-06-10: 70 mL via INTRA_ARTERIAL

## 2015-06-10 MED ORDER — MIDAZOLAM HCL 2 MG/2ML IJ SOLN
INTRAMUSCULAR | Status: DC | PRN
  Administered 2015-06-10: 1 mg via INTRAVENOUS

## 2015-06-10 MED ORDER — HEPARIN SODIUM (PORCINE) 1000 UNIT/ML IJ SOLN
INTRAMUSCULAR | Status: AC
  Filled 2015-06-10: qty 1

## 2015-06-10 MED ORDER — LIDOCAINE HCL (PF) 1 % IJ SOLN
INTRAMUSCULAR | Status: DC | PRN
  Administered 2015-06-10: 09:00:00

## 2015-06-10 MED ORDER — HEPARIN SODIUM (PORCINE) 1000 UNIT/ML IJ SOLN
INTRAMUSCULAR | Status: DC | PRN
  Administered 2015-06-10: 4000 [IU] via INTRAVENOUS

## 2015-06-10 MED ORDER — VERAPAMIL HCL 2.5 MG/ML IV SOLN
INTRAVENOUS | Status: DC | PRN
  Administered 2015-06-10: 09:00:00 via INTRA_ARTERIAL

## 2015-06-10 MED ORDER — SODIUM CHLORIDE 0.9 % WEIGHT BASED INFUSION
3.0000 mL/kg/h | INTRAVENOUS | Status: AC
  Administered 2015-06-10: 3 mL/kg/h via INTRAVENOUS

## 2015-06-10 MED ORDER — ASPIRIN 81 MG PO CHEW: 81.0000 mg | CHEWABLE_TABLET | ORAL | Status: DC

## 2015-06-10 MED ORDER — LIDOCAINE HCL (PF) 1 % IJ SOLN
INTRAMUSCULAR | Status: AC
  Filled 2015-06-10: qty 30

## 2015-06-10 MED ORDER — MIDAZOLAM HCL 2 MG/2ML IJ SOLN
INTRAMUSCULAR | Status: AC
  Filled 2015-06-10: qty 2

## 2015-06-10 SURGICAL SUPPLY — 11 items

## 2015-06-10 NOTE — H&P (View-Only) (Signed)
Patient ID: Maria [name], female   DOB: 05-28-1940, 75 y.o.   MRN: [id number]    Cardiology Office Note Date:  06/01/2015  Patient ID:  Maria [name], DOB 03/10/1941, MRN [id number] PCP:  Crawford Givens, MD  Cardiologist:  [name]   Chief Complaint: f/u stenting  History of Present Illness: Maria [name] is a 75 y.o. female with history of CAD (s/p DES to [facility] 01/2015), HTN, DM, GERD, HLD (intolerant of multiple statins), SVT s/p ablation 06/2014   Per review of history, she underwent catheter ablation of a parahisian atrial tachycardia which was successfully ablated from the non-coronary cusp of the aortic root. She initially did well after her ablation but then began to experience dizziness, SOB, and chest heaviness. Event monitor showed pauses so beta blocker was stopped then restarted at lower dose. She had an abnormal stress test notable for circumflex stenosis that was initially managed medically, LVEF normal She continued to have angina thus was brought in for elective PCI 01/22/15 and underwent DES to [facility]; otherwise had 30% pRCA and 35% D1. DAPT was recommended for 1 year. HR and BP remained stable on current regimen.  Some chest pain with eating seen by GI Buccini History of GERD  Bilateral thyroid nodules by [name] 02/2015 stable Plan to possibly biopsy after DAT for a year    Past Medical History  Diagnosis Date  . Hypertension   . Hyperlipidemia   . GERD (gastroesophageal reflux disease)   . Anemia 05/1985  . Right knee pain 02/2010    Injected - Dr. Shelle Iron  . PSVT (paroxysmal supraventricular tachycardia) (HCC)     a. s/p catheter ablation of a parahisian atrial tachycardia which was successfully ablated from the non-coronary cusp of the aortic root 06/2014.  . Fracture of lower leg 07/2001    Right  . Coronary artery disease     a. s/p DES to [facility] 01/2015.  [name] Pneumonia ?1990's X 1  . Type II diabetes mellitus (HCC) 2002  . DDD (degenerative disc disease), lumbar   . DDD  (degenerative disc disease), cervical   . Chronic back pain   . Renal cyst 07/10/2006    bilateral  . Multinodular thyroid   . Syncope and collapse 07/2001    "that's when I broke my leg"  . Sinus pause     a. By event monitoring 09/2014 - BB stopped at that time.  . Carotid artery disease (HCC)     a. 02/2015 - 1-39% bilaterally.    Past Surgical History  Procedure Laterality Date  . Esophagogastroduodenoscopy  08/14/2005    gastropathy biopsy, negative  . Anterior cervical decomp/discectomy fusion  10/03/2005    C4/5; C5/6  "it's got a plate in there"  . Posterior laminectomy / decompression lumbar spine  09/2001    due to herniated disc  . Supraventricular tachycardia ablation N/A 07/06/2014    Procedure: SUPRAVENTRICULAR TACHYCARDIA ABLATION;  Surgeon: Marinus Maw, MD;  Location: [facility] CATH LAB;  Service: Cardiovascular;  Laterality: N/A;  . Cardiac catheterization N/A 11/18/2014    Procedure: Left Heart Cath and Coronary Angiography;  Surgeon: Dayon Witt M [place], MD;  Location: [facility] INVASIVE CV LAB;  Service: Cardiovascular;  Laterality: N/A;  . Cardiac catheterization N/A 01/22/2015    Procedure: Coronary Stent Intervention;  Surgeon: Aboubacar Matsuo M [place], MD;  Location: [facility] INVASIVE CV LAB;  Service: Cardiovascular;  Laterality: N/A;  . Vaginal hysterectomy  1968    spotting  . Laparoscopic cholecystectomy  08/1992  .  Back surgery    . Breast biopsy Bilateral 1968 (multiple)    "all benign"  . Breast lumpectomy Bilateral 1968  . Cataract extraction w/ intraocular lens  implant, bilateral Bilateral 12/2010  . Coronary angioplasty      Current Outpatient Prescriptions  Medication Sig Dispense Refill  . amLODipine (NORVASC) 2.5 MG tablet Take 1 tablet by mouth daily.  6  . aspirin EC 81 MG tablet Take 1 tablet (81 mg total) by mouth daily. 90 tablet 3  . atenolol (TENORMIN) 25 MG tablet Take 0.5 tablets (12.5 mg total) by mouth daily. 45 tablet 3  . glyBURIDE (DIABETA) 5 MG tablet Take 1  tablet by mouth in AM and 0.5-1 tablet by mouth in PM 180 tablet 3  . isosorbide mononitrate (IMDUR) 30 MG 24 hr tablet Take 1 tablet (30 mg total) by mouth daily. 90 tablet 3  . losartan (COZAAR) 50 MG tablet TAKE 1 TABLET (50 MG TOTAL) BY MOUTH EVERY EVENING. 90 tablet 2  . nitroGLYCERIN (NITROSTAT) 0.4 MG SL tablet Place 0.4 mg under the tongue every 5 (five) minutes as needed for chest pain (no more than 3 doses).    [name] omeprazole (PRILOSEC) 40 MG capsule Take 1 capsule by mouth daily as needed. Heartburn or acid reflux    . pravastatin (PRAVACHOL) 20 MG tablet Take 1 tablet (20 mg total) by mouth every evening. 30 tablet 5  . ticagrelor (BRILINTA) 90 MG TABS tablet Take 1 tablet (90 mg total) by mouth 2 (two) times daily. 60 tablet 10  . traMADol (ULTRAM) 50 MG tablet Take 1 tablet (50 mg total) by mouth every 12 (twelve) hours as needed (sedation caution). 60 tablet 1   No current facility-administered medications for this visit.    Allergies:   Acetaminophen; Carvedilol; Crestor; Ezetimibe-simvastatin; Ibuprofen; Insulin glargine; Levemir; Macrobid; Metformin; Metoprolol tartrate; and Naproxen   Social History:  The patient  reports that she has never smoked. She has never used smokeless tobacco. She reports that she does not drink alcohol or use illicit drugs.   Family History:  The patient's family history includes Depression in her paternal aunt; Diabetes in her brother, maternal grandmother, paternal grandfather, paternal grandmother, and sister; Heart disease in her maternal grandfather and mother; Hypertension in her maternal grandfather, maternal grandmother, mother, paternal grandfather, and paternal grandmother; Myasthenia gravis in her sister; Stroke in her maternal grandmother.  ROS:  Please see the history of present illness. No headaches, visual changes, focal weakness reported. All other systems are reviewed and otherwise negative.   PHYSICAL EXAM:  VS:  BP 122/66 mmHg   Pulse 79  Ht  (1.676 m)  Wt 79.833 kg (176 lb)  BMI 28.42 kg/m2  SpO2 97% BMI: Body mass index is 28.42 kg/(m^2). Well nourished, well developed WF, in no acute distress HEENT: normocephalic, atraumatic Neck: no JVD, carotid bruits or masses Cardiac:  normal S1, S2; RRR; no murmurs, rubs, or gallops Lungs:  clear to auscultation bilaterally, no wheezing, rhonchi or rales Abd: soft, nontender, no hepatomegaly, + BS MS: no deformity or atrophy Ext: no edema, right radial cath site completely healed, no ecchymosis or hematoma; good pulse Skin: warm and dry, no rash Neuro:  moves all extremities spontaneously, no focal abnormalities noted, follows commands Psych: euthymic mood, full affect  EKG:  Done today shows NSR 65bpm, low voltage QRS, no acute changes  Recent Labs: 10/27/2014: Pro B Natriuretic peptide (BNP) 37.0 01/23/2015: BUN 9; Creatinine, Ser 0.98; Hemoglobin 12.3; Platelets 280;  Potassium 4.1; Sodium 140 03/09/2015: ALT 16  03/09/2015: Cholesterol 172; HDL 28*; LDL Cholesterol 122; Total CHOL/HDL Ratio 6.1*; Triglycerides 111; VLDL 22   CrCl cannot be calculated (Patient has no serum creatinine result on file.).   Wt Readings from Last 3 Encounters:  06/01/15 79.833 kg (176 lb)  03/24/15 81.874 kg (180 lb 8 oz)  03/09/15 81.375 kg (179 lb 6.4 oz)     Other studies reviewed: Additional studies/records reviewed today include: summarized above  ASSESSMENT AND PLAN:  1. CAD s/p PCI as above - chest pain not clear if it is GI related or cardiac Given recent stent DM and lower sensitivity of myovue for circumflex disease favor cath She is ok with this Risks discussed willing to proceed. Scheduled for 1/26 with Dr [place] who did her stent in September  She wants to change to plavix / or effient If stent patient as Flonnie Overman makes her dizzy and dyspnic 2. Dizziness - etiology not totally clear, question medication related. Given h/o prior bradycardia, will further reduce  atenolol to 12.5mg  daily. Check carotid duplex. If dizziness persists, may consider neurology evaluation. 3. Essential HTN - presently controlled. Follow with med changes.  She wants to stop norvasc and imdur if stent patent  4. HLD - refer to lipid clinic given multiple statin intolerances to consider PCSK-9 therapy. 5. Paroxysmal SVT with history of sinus pause on event monitoring - see above. 6. Thyroid nodule:  Disposition: F/u with me post cath   Lab called Pre cath orders done Labs next week   If stent patent d/c imdur / amlodipine and change Brillinta to Effient or Plavix  Current medicines are reviewed at length with the patient today.  The patient did not have any concerns regarding medicines.  Charlton Haws

## 2015-06-10 NOTE — Interval H&P Note (Signed)
History and Physical Interval Note:  06/10/2015 8:25 AM  Maria [name]  has presented today for surgery, with the diagnosis of cp  The various methods of treatment have been discussed with the patient and family. After consideration of risks, benefits and other options for treatment, the patient has consented to  Procedure(s): Left Heart Cath and Coronary Angiography (N/A) as a surgical intervention .  The patient's history has been reviewed, patient examined, no change in status, stable for surgery.  I have reviewed the patient's chart and labs.  Questions were answered to the patient's satisfaction.   Cath Lab Visit (complete for each Cath Lab visit)  Clinical Evaluation Leading to the Procedure:   ACS: No.  Non-ACS:    Anginal Classification: CCS II  Anti-ischemic medical therapy: Maximal Therapy (2 or more classes of medications)  Non-Invasive Test Results: No non-invasive testing performed  Prior CABG: No previous CABG        Maria [name] [facility] 06/10/2015 8:26 AM

## 2015-06-10 NOTE — Discharge Instructions (Signed)
Radial Site Care °Refer to this sheet in the next few weeks. These instructions provide you with information about caring for yourself after your procedure. Your health care provider may also give you more specific instructions. Your treatment has been planned according to current medical practices, but problems sometimes occur. Call your health care provider if you have any problems or questions after your procedure. °WHAT TO EXPECT AFTER THE PROCEDURE °After your procedure, it is typical to have the following: °· Bruising at the radial site that usually fades within 1-2 weeks. °· Blood collecting in the tissue (hematoma) that may be painful to the touch. It should usually decrease in size and tenderness within 1-2 weeks. °HOME CARE INSTRUCTIONS °· Take medicines only as directed by your health care provider. °· You may shower 24-48 hours after the procedure or as directed by your health care provider. Remove the bandage (dressing) and gently wash the site with plain soap and water. Pat the area dry with a clean towel. Do not rub the site, because this may cause bleeding. °· Do not take baths, swim, or use a hot tub until your health care provider approves. °· Check your insertion site every day for redness, swelling, or drainage. °· Do not apply powder or lotion to the site. °· Do not flex or bend the affected arm for 24 hours or as directed by your health care provider. °· Do not push or pull heavy objects with the affected arm for 24 hours or as directed by your health care provider. °· Do not lift over 10 lb (4.5 kg) for 5 days after your procedure or as directed by your health care provider. °· Ask your health care provider when it is okay to: °¨ Return to work or school. °¨ Resume usual physical activities or sports. °¨ Resume sexual activity. °· Do not drive home if you are discharged the same day as the procedure. Have someone else drive you. °· You may drive 24 hours after the procedure unless otherwise  instructed by your health care provider. °· Do not operate machinery or power tools for 24 hours after the procedure. °· If your procedure was done as an outpatient procedure, which means that you went home the same day as your procedure, a responsible adult should be with you for the first 24 hours after you arrive home. °· Keep all follow-up visits as directed by your health care provider. This is important. °SEEK MEDICAL CARE IF: °· You have a fever. °· You have chills. °· You have increased bleeding from the radial site. Hold pressure on the site. °SEEK IMMEDIATE MEDICAL CARE IF: °· You have unusual pain at the radial site. °· You have redness, warmth, or swelling at the radial site. °· You have drainage (other than a small amount of blood on the dressing) from the radial site. °· The radial site is bleeding, and the bleeding does not stop after 30 minutes of holding steady pressure on the site. °· Your arm or hand becomes pale, cool, tingly, or numb. °  °This information is not intended to replace advice given to you by your health care provider. Make sure you discuss any questions you have with your health care provider. °  °Document Released: 06/03/2010 Document Revised: 05/22/2014 Document Reviewed: 11/17/2013 °Elsevier Interactive Patient Education ©2016 [employer] ° °

## 2015-06-11 ENCOUNTER — Encounter (HOSPITAL_COMMUNITY): Payer: Self-pay | Admitting: Cardiology

## 2015-06-14 ENCOUNTER — Encounter: Payer: Self-pay | Admitting: Family Medicine

## 2015-06-14 ENCOUNTER — Ambulatory Visit (INDEPENDENT_AMBULATORY_CARE_PROVIDER_SITE_OTHER): Payer: Medicare Other | Admitting: Family Medicine

## 2015-06-14 VITALS — BP 136/76 | HR 77 | Temp 98.2°F | Wt 171.8 lb

## 2015-06-14 DIAGNOSIS — R300 Dysuria: Secondary | ICD-10-CM | POA: Diagnosis not present

## 2015-06-14 DIAGNOSIS — S060X0D Concussion without loss of consciousness, subsequent encounter: Secondary | ICD-10-CM

## 2015-06-14 LAB — POC URINALSYSI DIPSTICK (AUTOMATED)
BILIRUBIN UA: NEGATIVE
GLUCOSE UA: NEGATIVE
KETONES UA: NEGATIVE
Nitrite, UA: NEGATIVE
Protein, UA: NEGATIVE
RBC UA: NEGATIVE
UROBILINOGEN UA: 4
pH, UA: 6

## 2015-06-14 MED ORDER — SULFAMETHOXAZOLE-TRIMETHOPRIM 400-80 MG PO TABS: 1.0000 | ORAL_TABLET | Freq: Two times a day (BID) | ORAL | Status: DC

## 2015-06-14 NOTE — Patient Instructions (Addendum)
Drink plenty of water and start the antibiotics today.  We'll contact you with your lab report.  Take care.   Glad to see you.    You are okay to begin a gradual exercise program.

## 2015-06-14 NOTE — Progress Notes (Signed)
Pre visit review using our clinic review tool, if applicable. No additional management support is needed unless otherwise documented below in the visit note.  She has been SOB from the brilinta, improved with caffeine- as expected.   Cath results:  1. Nonobstructive CAD. Continued patency of the stent in the LCx. 2. Normal LV function. dw pt.    Likely UTI- pain with urinating after drinking caffeine.  This feels like prev UTI.  No fevers.  U/a d/w pt.    She needed a parking form done given the breathing changes.   D/w pt and done at work.    Needs lancets and strips, rxs.  Done at OV.    Concussion f/u.  No sig changes now.  Some occ mild memory lapses when fatigued- no red flag events.  O/w no sx, no photophobia.  No HA.  No weakness.   Meds, vitals, and allergies reviewed.   ROS: See HPI.  Otherwise, noncontributory.  nad ncat Mmm Neck supple No LA rrr ctab abd soft, not ttp, no rebound No CVA pain Ext w/o edema.

## 2015-06-15 DIAGNOSIS — R300 Dysuria: Secondary | ICD-10-CM | POA: Insufficient documentation

## 2015-06-15 NOTE — Assessment & Plan Note (Signed)
Sx resolved, okay to resume gradual exercise program.  Update me as needed.

## 2015-06-15 NOTE — Assessment & Plan Note (Signed)
Septra, ucx, fluids by mouth, fu prn.  Nontoxic.  She'll try to limit soda, as that appears to historically trigger her uti sx.

## 2015-06-16 LAB — URINE CULTURE

## 2015-06-18 ENCOUNTER — Encounter: Payer: Medicare Other | Admitting: Physician Assistant

## 2015-06-22 ENCOUNTER — Telehealth: Payer: Self-pay | Admitting: Cardiovascular Disease

## 2015-06-22 NOTE — Telephone Encounter (Signed)
Patient wants to see Dr. [name], his next available is March. Made an appointment for 07/27/15, and encouraged patient to keep appointment on Thursday due to recent heart cath since March appointment is over a month away. Patient verbalized understanding.

## 2015-06-22 NOTE — Telephone Encounter (Signed)
New message      Called to confirm patient's appt for this thurs with the PA.  Pt want to see Dr [name].  His next available appt is in march.  Pt request to talk to the nurse----she has questions to ask.

## 2015-06-23 ENCOUNTER — Encounter: Payer: Self-pay | Admitting: Cardiovascular Disease

## 2015-06-23 NOTE — Progress Notes (Signed)
Patient ID: Maria [name], female   DOB: 03-03-1941, 76 y.o.   MRN: [id number]    Cardiology Office Note Date:  06/24/2015  Patient ID:  Maria [name], DOB [birth year], MRN [id number] PCP:  Crawford Givens, MD  Cardiologist:  [name]   Chief Complaint: f/u stenting  History of Present Illness: Maria [name] is a 75 y.o. female with history of CAD (s/p DES to [facility] 01/2015), HTN, DM, GERD, HLD (intolerant of multiple statins), SVT s/p ablation 06/2014   Per review of history, she underwent catheter ablation of a parahisian atrial tachycardia which was successfully ablated from the non-coronary cusp of the aortic root. She initially did well after her ablation but then began to experience dizziness, SOB, and chest heaviness. Event monitor showed pauses so beta blocker was stopped then restarted at lower dose. She had an abnormal stress test notable for circumflex stenosis that was initially managed medically, LVEF normal She continued to have angina thus was brought in for elective PCI 01/22/15 and underwent DES to [facility]; otherwise had 30% pRCA and 35% D1. DAPT was recommended for 1 year. HR and BP remained stable on current regimen.  Some chest pain with eating seen by GI Buccini History of GERD  Had f/u cath to r/o angina  06/10/15  Conclusion     Prox RCA lesion, 30% stenosed.  1st Diag lesion, 35% stenosed.  The left ventricular systolic function is normal.  1. Nonobstructive CAD. Continued patency of the stent in the LCx. 2. Normal LV function.  Continue medical therapy. Consider switching Brilinta to alternative antiplatelet agent.    Bilateral thyroid nodules by [name] 02/2015 stable Plan to possibly biopsy after DAT for a year  September 2017 Will change Brillinta to Plavix      Past Medical History  Diagnosis Date  . Hypertension   . Hyperlipidemia   . GERD (gastroesophageal reflux disease)   . Anemia 05/1985  . Right knee pain 02/2010    Injected - Dr. Shelle Iron  . PSVT (paroxysmal  supraventricular tachycardia) (HCC)     a. s/p catheter ablation of a parahisian atrial tachycardia which was successfully ablated from the non-coronary cusp of the aortic root 06/2014.  . Fracture of lower leg 07/2001    Right  . Coronary artery disease     a. s/p DES to [facility] 01/2015.  [name] Pneumonia ?1990's X 1  . Type II diabetes mellitus (HCC) 2002  . DDD (degenerative disc disease), lumbar   . DDD (degenerative disc disease), cervical   . Chronic back pain   . Renal cyst 07/10/2006    bilateral  . Multinodular thyroid   . Syncope and collapse 07/2001    "that's when I broke my leg"  . Sinus pause     a. By event monitoring 09/2014 - BB stopped at that time.  . Carotid artery disease (HCC)     a. 02/2015 - 1-39% bilaterally.    Past Surgical History  Procedure Laterality Date  . Esophagogastroduodenoscopy  08/14/2005    gastropathy biopsy, negative  . Anterior cervical decomp/discectomy fusion  10/03/2005    C4/5; C5/6  "it's got a plate in there"  . Posterior laminectomy / decompression lumbar spine  09/2001    due to herniated disc  . Supraventricular tachycardia ablation N/A 07/06/2014    Procedure: SUPRAVENTRICULAR TACHYCARDIA ABLATION;  Surgeon: Marinus Maw, MD;  Location: [facility] CATH LAB;  Service: Cardiovascular;  Laterality: N/A;  . Cardiac catheterization N/A 11/18/2014  Procedure: Left Heart Cath and Coronary Angiography;  Surgeon: Peter M Swaziland, MD;  Location: [facility] INVASIVE CV LAB;  Service: Cardiovascular;  Laterality: N/A;  . Cardiac catheterization N/A 01/22/2015    Procedure: Coronary Stent Intervention;  Surgeon: Peter M Swaziland, MD;  Location: [facility] INVASIVE CV LAB;  Service: Cardiovascular;  Laterality: N/A;  . Vaginal hysterectomy  1968    spotting  . Laparoscopic cholecystectomy  08/1992  . Back surgery    . Breast biopsy Bilateral 1968 (multiple)    "all benign"  . Breast lumpectomy Bilateral 1968  . Cataract extraction w/ intraocular lens  implant, bilateral  Bilateral 12/2010  . Coronary angioplasty    . Cardiac catheterization N/A 06/10/2015    Procedure: Left Heart Cath and Coronary Angiography;  Surgeon: Peter M Swaziland, MD;  Location: [facility] INVASIVE CV LAB;  Service: Cardiovascular;  Laterality: N/A;    Current Outpatient Prescriptions  Medication Sig Dispense Refill  . aspirin EC 81 MG tablet Take 1 tablet (81 mg total) by mouth daily. 90 tablet 3  . atenolol (TENORMIN) 25 MG tablet Take 0.5 tablets (12.5 mg total) by mouth daily. 45 tablet 3  . glyBURIDE (DIABETA) 5 MG tablet Take 1 tablet by mouth in AM and 0.5-1 tablet by mouth in PM 180 tablet 3  . ibuprofen (ADVIL,MOTRIN) 200 MG tablet Take 400 mg by mouth every 8 (eight) hours as needed for mild pain or moderate pain.    . isosorbide mononitrate (IMDUR) 30 MG 24 hr tablet Take 1 tablet (30 mg total) by mouth daily. 90 tablet 3  . losartan (COZAAR) 25 MG tablet Take 1 tablet (25 mg total) by mouth daily. 90 tablet 3  . nitroGLYCERIN (NITROSTAT) 0.4 MG SL tablet Place 0.4 mg under the tongue every 5 (five) minutes as needed for chest pain (no more than 3 doses).    [name] omeprazole (PRILOSEC) 40 MG capsule Take 1 capsule by mouth daily as needed. Heartburn or acid reflux    . pravastatin (PRAVACHOL) 20 MG tablet Take 1 tablet (20 mg total) by mouth every evening. 30 tablet 5  . sulfamethoxazole-trimethoprim (BACTRIM) 400-80 MG tablet Take 1 tablet by mouth 2 (two) times daily. For 3 days- 3 courses total 18 tablet 0  . ticagrelor (BRILINTA) 90 MG TABS tablet Take 1 tablet (90 mg total) by mouth 2 (two) times daily. 60 tablet 10  . traMADol (ULTRAM) 50 MG tablet Take 1 tablet (50 mg total) by mouth every 12 (twelve) hours as needed (sedation caution). 60 tablet 1   No current facility-administered medications for this visit.    Allergies:   Acetaminophen; Carvedilol; Crestor; Ezetimibe-simvastatin; Ibuprofen; Insulin glargine; Levemir; Macrobid; Metformin; Metoprolol tartrate; and Naproxen    Social History:  The patient  reports that she has never smoked. She has never used smokeless tobacco. She reports that she does not drink alcohol or use illicit drugs.   Family History:  The patient's family history includes Depression in her paternal aunt; Diabetes in her brother, maternal grandmother, paternal grandfather, paternal grandmother, and sister; Heart disease in her maternal grandfather and mother; Hypertension in her maternal grandfather, maternal grandmother, mother, paternal grandfather, and paternal grandmother; Myasthenia gravis in her sister; Stroke in her maternal grandmother.  ROS:  Please see the history of present illness. No headaches, visual changes, focal weakness reported. All other systems are reviewed and otherwise negative.   PHYSICAL EXAM:  VS:  BP 120/60 mmHg  Pulse 61  Ht  (1.676 m)  Wt  80.65 kg (177 lb 12.8 oz)  BMI 28.71 kg/m2  SpO2 96% BMI: Body mass index is 28.71 kg/(m^2). Well nourished, well developed WF, in no acute distress HEENT: normocephalic, atraumatic Neck: no JVD, carotid bruits or masses Cardiac:  normal S1, S2; RRR; no murmurs, rubs, or gallops Lungs:  clear to auscultation bilaterally, no wheezing, rhonchi or rales Abd: soft, nontender, no hepatomegaly, + BS MS: no deformity or atrophy Ext: no edema, right radial cath site completely healed, no ecchymosis or hematoma; good pulse Skin: warm and dry, no rash Neuro:  moves all extremities spontaneously, no focal abnormalities noted, follows commands Psych: euthymic mood, full affect  EKG:  Done today shows NSR 65bpm, low voltage QRS, no acute changes  Recent Labs: 10/27/2014: Pro B Natriuretic peptide (BNP) 37.0 06/09/2015: ALT 14; BUN 18; Creat 0.97*; Hemoglobin 12.2; Platelets 369; Potassium 4.4; Sodium 136  06/09/2015: Cholesterol 157; HDL 31*; LDL Cholesterol 100; Total CHOL/HDL Ratio 5.1*; Triglycerides 130; VLDL 26   Estimated Creatinine Clearance: 53.7 mL/min (by C-G  formula based on Cr of 0.97).   Wt Readings from Last 3 Encounters:  06/24/15 80.65 kg (177 lb 12.8 oz)  06/14/15 77.905 kg (171 lb 12 oz)  06/10/15 76.204 kg (168 lb)     Other studies reviewed: Additional studies/records reviewed today include: summarized above  ASSESSMENT AND PLAN:   CAD s/p PCI as above - Continue DAT until at least 01/2016  Cath 05/2015 no restenosis   Dizziness - etiology not totally clear, question medication related. Given h/o prior bradycardia, will further reduce atenolol to 12.5mg  daily. Carotids with plaque and no stenosis  Stop norvasc cut cozaar back to 25 mg   Essential HTN - presently controlled. Follow with med changes.  She wants to stop norvasc and imdur if stent patent   HLD - refer to lipid clinic given multiple statin intolerances to consider [facility] therapy.  Paroxysmal SVT with history of sinus pause on event monitoring - see above.  Thyroid nodule:  Disposition:  Needs f/u Dr Matthias Hughs for GERD ? EGD    Cut cozaar down to 25 mg and stop amlodipine   Current medicines are reviewed at length with the patient today.  The patient did not have any concerns regarding medicines.  Charlton Haws

## 2015-06-24 ENCOUNTER — Ambulatory Visit (INDEPENDENT_AMBULATORY_CARE_PROVIDER_SITE_OTHER): Payer: Medicare Other | Admitting: Cardiovascular Disease

## 2015-06-24 ENCOUNTER — Encounter: Payer: Medicare Other | Admitting: Physician Assistant

## 2015-06-24 ENCOUNTER — Encounter: Payer: Self-pay | Admitting: Cardiovascular Disease

## 2015-06-24 VITALS — BP 120/60 | HR 61 | Ht 66.0 in | Wt 177.8 lb

## 2015-06-24 DIAGNOSIS — I259 Chronic ischemic heart disease, unspecified: Secondary | ICD-10-CM | POA: Diagnosis not present

## 2015-06-24 DIAGNOSIS — Z5181 Encounter for therapeutic drug level monitoring: Secondary | ICD-10-CM | POA: Diagnosis not present

## 2015-06-24 MED ORDER — LOSARTAN POTASSIUM 25 MG PO TABS
25.0000 mg | ORAL_TABLET | Freq: Every day | ORAL | Status: DC
Start: 2015-06-24 — End: 2016-09-06

## 2015-06-24 NOTE — Patient Instructions (Addendum)
Medication Instructions:  Your physician has recommended you make the following change in your medication:  1-Stop amlodipine  2-Decrease Cozaar 25 mg by mouth daily  Labwork: NONE  Testing/Procedures: NONE  Follow-Up: Your physician wants you to follow-up in: 6 months with Dr. [name]. You will receive a reminder letter in the mail two months in advance. If you don't receive a letter, please call our office to schedule the follow-up appointment.  If you need a refill on your cardiac medications before your next appointment, please call your pharmacy.

## 2015-07-07 ENCOUNTER — Ambulatory Visit: Payer: Medicare Other | Admitting: Cardiovascular Disease

## 2015-07-14 LAB — HM DIABETES EYE EXAM

## 2015-07-16 ENCOUNTER — Encounter: Payer: Self-pay | Admitting: Family Medicine

## 2015-07-27 ENCOUNTER — Ambulatory Visit: Payer: Medicare Other | Admitting: Cardiovascular Disease

## 2015-07-28 ENCOUNTER — Other Ambulatory Visit: Payer: Self-pay | Admitting: Obstetrics and Gynecology

## 2015-07-28 DIAGNOSIS — R928 Other abnormal and inconclusive findings on diagnostic imaging of breast: Secondary | ICD-10-CM

## 2015-08-02 ENCOUNTER — Ambulatory Visit
Admission: RE | Admit: 2015-08-02 | Discharge: 2015-08-02 | Disposition: A | Discharge status: Home / Self Care | Payer: Medicare Other | Source: Ambulatory Visit | Attending: Obstetrics and Gynecology | Admitting: Obstetrics and Gynecology

## 2015-08-02 DIAGNOSIS — R928 Other abnormal and inconclusive findings on diagnostic imaging of breast: Secondary | ICD-10-CM

## 2015-08-19 ENCOUNTER — Other Ambulatory Visit: Payer: Self-pay | Admitting: Family Medicine

## 2015-08-24 ENCOUNTER — Telehealth: Payer: Self-pay | Admitting: *Deleted

## 2015-08-24 NOTE — Telephone Encounter (Signed)
PA submitted for Glyburide thru [facility], awaiting response.

## 2015-08-26 NOTE — Telephone Encounter (Signed)
[employer] left v/m requesting additional information prior to 08/27/2015 for PA of Glyburide. Request cb using ref # [id number].

## 2015-08-30 NOTE — Telephone Encounter (Signed)
Form with additional info faxed.

## 2015-09-07 ENCOUNTER — Other Ambulatory Visit: Payer: Self-pay | Admitting: *Deleted

## 2015-09-07 NOTE — Telephone Encounter (Signed)
Received approval for glyburide from April 11,2017 to May 14, 2016.  Letter faxed to pharmacy, signed and scanned.

## 2015-09-17 ENCOUNTER — Encounter: Payer: Self-pay | Admitting: Family Medicine

## 2015-09-17 ENCOUNTER — Ambulatory Visit (INDEPENDENT_AMBULATORY_CARE_PROVIDER_SITE_OTHER): Payer: Medicare Other | Admitting: Family Medicine

## 2015-09-17 VITALS — BP 128/72 | HR 65 | Temp 98.5°F | Wt 177.0 lb

## 2015-09-17 DIAGNOSIS — E1159 Type 2 diabetes mellitus with other circulatory complications: Secondary | ICD-10-CM

## 2015-09-17 DIAGNOSIS — E119 Type 2 diabetes mellitus without complications: Secondary | ICD-10-CM | POA: Diagnosis not present

## 2015-09-17 DIAGNOSIS — T148XXA Other injury of unspecified body region, initial encounter: Secondary | ICD-10-CM

## 2015-09-17 DIAGNOSIS — I471 Supraventricular tachycardia: Secondary | ICD-10-CM

## 2015-09-17 DIAGNOSIS — T148 Other injury of unspecified body region: Secondary | ICD-10-CM

## 2015-09-17 DIAGNOSIS — M25569 Pain in unspecified knee: Secondary | ICD-10-CM

## 2015-09-17 DIAGNOSIS — S060X0D Concussion without loss of consciousness, subsequent encounter: Secondary | ICD-10-CM

## 2015-09-17 LAB — CBC WITH DIFFERENTIAL/PLATELET
BASOS ABS: 0 {cells}/uL (ref 0–200)
Basophils Relative: 0 %
EOS PCT: 13 %
Eosinophils Absolute: 1079 cells/uL — ABNORMAL HIGH (ref 15–500)
HCT: 34.6 % — ABNORMAL LOW (ref 35.0–45.0)
Hemoglobin: 11.6 g/dL — ABNORMAL LOW (ref 11.7–15.5)
LYMPHS PCT: 27 %
Lymphs Abs: 2241 cells/uL (ref 850–3900)
MCH: 29.1 pg (ref 27.0–33.0)
MCHC: 33.5 g/dL (ref 32.0–36.0)
MCV: 86.9 fL (ref 80.0–100.0)
MONOS PCT: 7 %
MPV: 8.9 fL (ref 7.5–12.5)
Monocytes Absolute: 581 cells/uL (ref 200–950)
NEUTROS PCT: 53 %
Neutro Abs: 4399 cells/uL (ref 1500–7800)
Platelets: 376 10*3/uL (ref 140–400)
RBC: 3.98 MIL/uL (ref 3.80–5.10)
RDW: 13.8 % (ref 11.0–15.0)
WBC: 8.3 10*3/uL (ref 3.8–10.8)

## 2015-09-17 LAB — HEMOGLOBIN A1C
HEMOGLOBIN A1C: 7.1 % — AB (ref <5.7)
MEAN PLASMA GLUCOSE: 157 mg/dL

## 2015-09-17 MED ORDER — TRAMADOL HCL 50 MG PO TABS: 50.0000 mg | ORAL_TABLET | Freq: Two times a day (BID) | ORAL | Status: DC | PRN

## 2015-09-17 NOTE — Patient Instructions (Addendum)
Go to the lab on the way out.  We'll contact you with your lab report. Try cutting back on the pravastatin.  Try 1/2 tab a day or 1 tab Monday through Friday.   See what you can tolerate.  Take care.  Glad to see you.

## 2015-09-17 NOTE — Progress Notes (Signed)
Pre visit review using our clinic review tool, if applicable. No additional management support is needed unless otherwise documented below in the visit note.  On brilinta with bruising noted.  She has been dizzy/worse since starting brilinta.  She had fallen and hit her R shoulder with bruising noted (she was up at night to go to the bathroom, getting up from the toilet).  Also with B medial ankle bruising w/o clear injury hx.  No blood in stool/urine.  Gums rarely bleed.  No head injury, no concussion sx with this fall.  D/w pt.  Fall cautions d/w pt.  Unclear if some of her unsteadiness is related to her meds vs prev concussion.  She is trying to avoid high risk situations, d/w pt.  She has been trying to do some balance exercises at home.  Offered vestibular rehab, she'll consider.    She needed refill on tramadol for joint aches after yardwork, done at [facility] and given to patient.   DM2.  Due for A1c.  Sugar up to ~175 recently, with diet changes/nonadherence on vacation.  She is getting back on DM2 diet now and working in the yard.  A1c pending.   The other night she had heart pounding, when she woke up in the middle of a nightmare, not a true episode of just heart racing at rest o/w.  This was an isolated event.  No CP, SOB, BLE edema. She calmed down and it went away.  No other sx in the meantime.    Aches on the pravastatin.  Better than on other meds but still noted.  D/w pt about options.    Meds, vitals, and allergies reviewed.   ROS: Per HPI unless specifically indicated in ROS section   GEN: nad, alert and oriented HEENT: mucous membranes moist NECK: supple w/o LA CV: rrr PULM: ctab, no inc wob ABD: soft, +bs EXT: no edema SKIN: no acute rash but minimal bruising on the B medial ankles w/o tenderness yellowish bruise on the R shoulder.    Diabetic foot exam: Normal inspection No skin breakdown No calluses  Normal DP pulses Normal sensation to light touch and monofilament Nails  normal

## 2015-09-19 ENCOUNTER — Other Ambulatory Visit: Payer: Self-pay | Admitting: Family Medicine

## 2015-09-19 DIAGNOSIS — T148XXA Other injury of unspecified body region, initial encounter: Secondary | ICD-10-CM | POA: Insufficient documentation

## 2015-09-19 DIAGNOSIS — D649 Anemia, unspecified: Secondary | ICD-10-CM

## 2015-09-19 NOTE — Assessment & Plan Note (Signed)
Likely incidental, see notes on CBC.  I would expect some bruising at baseline given her meds.  I don't expect this to be pathologic.  D/w pt.  >25 minutes spent in face to face time with patient, >50% spent in counselling or coordination of care.

## 2015-09-19 NOTE — Assessment & Plan Note (Signed)
The only heart sx were in a nightmare, not a true episode just heart racing at rest o/w.  I wouldn't do anything different for now.  She agrees.

## 2015-09-19 NOTE — Assessment & Plan Note (Signed)
Continue prn tramadol.  D/w pt about fall risk.

## 2015-09-19 NOTE — Assessment & Plan Note (Signed)
Not likely another concussion as she didn't hit her head and she doesn't have new sx.  D/w pt about fall precautions and offered vestibular rehab.  She'll consider.

## 2015-09-19 NOTE — Assessment & Plan Note (Signed)
See notes on labs.  Try cutting back on the pravastatin. Can try 1/2 tab a day or 1 tab Monday through Friday, to see what she can tolerate.

## 2015-11-19 ENCOUNTER — Telehealth: Payer: Self-pay | Admitting: Family Medicine

## 2015-11-19 MED ORDER — SULFAMETHOXAZOLE-TRIMETHOPRIM 400-80 MG PO TABS: 1.0000 | ORAL_TABLET | Freq: Two times a day (BID) | ORAL | Status: DC

## 2015-11-19 NOTE — Telephone Encounter (Signed)
Patient Name: Maria FullerBARBARA Benavidez  DOB: 08-05-1940    Initial Comment caller states she has painful urination   Nurse Assessment  Nurse: Sherilyn CooterHenry, RN, Thurmond ButtsWade Date/Time Lamount Cohen(Eastern Time): 11/19/2015 10:13:18 AM  Confirm and document reason for call. If symptomatic, describe symptoms. You must click the next button to save text entered. ---Caller states she has painful urination which began yesterday. She states that the pain is very uncomfortable. [place] is on [place] way [place] from [place] and won't be home until late. She states that Dr. Para Marchuncan has given her the same medication each time. She is asking if the medication can be called in for her so she can pick it up tomorrow since the office is closed. She uses [facility]. Advised her that I will forward her request and ask that they return her call about this. She verbalized understanding.  Has the patient traveled out of the country within the last 30 days? ---Not Applicable  Does the patient have any new or worsening symptoms? ---Yes  Will a triage be completed? ---No  Select reason for no triage. ---Other     Guidelines    Guideline Title Affirmed Question Affirmed Notes       Final Disposition User        Comments  Patient didn't actually refuse appointment, but she will not be back in time to be seen. She is requesting medication only as she know this is only going to get worse.

## 2015-11-19 NOTE — Telephone Encounter (Signed)
Start septra and f/u prn.  Sent.  Thanks.

## 2015-11-19 NOTE — Telephone Encounter (Signed)
Patient advised.

## 2016-01-04 ENCOUNTER — Encounter: Payer: Self-pay | Admitting: Family Medicine

## 2016-01-04 ENCOUNTER — Ambulatory Visit (INDEPENDENT_AMBULATORY_CARE_PROVIDER_SITE_OTHER): Payer: Medicare Other | Admitting: Family Medicine

## 2016-01-04 VITALS — BP 142/76 | HR 63 | Temp 98.6°F | Wt 173.8 lb

## 2016-01-04 DIAGNOSIS — E785 Hyperlipidemia, unspecified: Secondary | ICD-10-CM

## 2016-01-04 DIAGNOSIS — S060X0D Concussion without loss of consciousness, subsequent encounter: Secondary | ICD-10-CM

## 2016-01-04 DIAGNOSIS — E119 Type 2 diabetes mellitus without complications: Secondary | ICD-10-CM | POA: Diagnosis not present

## 2016-01-04 DIAGNOSIS — E1159 Type 2 diabetes mellitus with other circulatory complications: Secondary | ICD-10-CM

## 2016-01-04 DIAGNOSIS — D649 Anemia, unspecified: Secondary | ICD-10-CM | POA: Diagnosis not present

## 2016-01-04 DIAGNOSIS — M5030 Other cervical disc degeneration, unspecified cervical region: Secondary | ICD-10-CM

## 2016-01-04 LAB — CBC WITH DIFFERENTIAL/PLATELET
BASOS ABS: 0 10*3/uL (ref 0.0–0.1)
Basophils Relative: 0.3 % (ref 0.0–3.0)
EOS PCT: 10.6 % — AB (ref 0.0–5.0)
Eosinophils Absolute: 1 10*3/uL — ABNORMAL HIGH (ref 0.0–0.7)
HCT: 37.4 % (ref 36.0–46.0)
HEMOGLOBIN: 12.6 g/dL (ref 12.0–15.0)
Lymphocytes Relative: 20.7 % (ref 12.0–46.0)
Lymphs Abs: 2 10*3/uL (ref 0.7–4.0)
MCHC: 33.6 g/dL (ref 30.0–36.0)
MCV: 87.7 fl (ref 78.0–100.0)
MONO ABS: 0.6 10*3/uL (ref 0.1–1.0)
MONOS PCT: 5.8 % (ref 3.0–12.0)
Neutro Abs: 6 10*3/uL (ref 1.4–7.7)
Neutrophils Relative %: 62.6 % (ref 43.0–77.0)
Platelets: 380 10*3/uL (ref 150.0–400.0)
RBC: 4.27 Mil/uL (ref 3.87–5.11)
RDW: 13.8 % (ref 11.5–15.5)
WBC: 9.6 10*3/uL (ref 4.0–10.5)

## 2016-01-04 LAB — HEMOGLOBIN A1C: HEMOGLOBIN A1C: 7.1 % — AB (ref 4.6–6.5)

## 2016-01-04 LAB — IBC PANEL
IRON: 56 ug/dL (ref 42–145)
SATURATION RATIOS: 17.2 % — AB (ref 20.0–50.0)
Transferrin: 233 mg/dL (ref 212.0–360.0)

## 2016-01-04 LAB — TSH: TSH: 2.92 u[IU]/mL (ref 0.35–4.50)

## 2016-01-04 MED ORDER — CELECOXIB 200 MG PO CAPS: 200.0000 mg | ORAL_CAPSULE | Freq: Every day | ORAL | 5 refills | Status: DC | PRN

## 2016-01-04 NOTE — Progress Notes (Signed)
Pre visit review using our clinic review tool, if applicable. No additional management support is needed unless otherwise documented below in the visit note. 

## 2016-01-04 NOTE — Progress Notes (Signed)
She has f/u with cardiology pending.  She is still on brilinta and asa together.  She is approaching 1 year on combination of treatment.  I'll defer to cardiology about duration of tx.    Concussion.  She is about 1 year out.  She is "much much better that it was".  Has had some HA, R frontal/temporal area.  She had a period of time w/o HA but then they started back in the last few weeks. This was similar to prev HA when the concussion started.  The HA are mild.  No focal neuro changes.  Mild photosensitivity but she avoids bright lights at baseline from cataract surgery.   Leg cramps.  She had to stop her cholesterol medicine. She did better off pravastatin.   She tried to taper the dose but still had troubles and had to stop the medicine.  D/w pt about statin intolerance.  She'll talk to cards about options in the meantime.   Needed refill on celebrex.  Had used for HA from pain originating from the neck.  That is different from above.  Neck pain will get better with celebrex.  She can tolerate celebrex, d/w pt.   Her R shoulder was recently injected by ortho with some relief.    PMH and SH reviewed  ROS: Per HPI unless specifically indicated in ROS section   Meds, vitals, and allergies reviewed.   GEN: nad, alert and oriented HEENT: mucous membranes moist NECK: supple w/o LA CV: rrr PULM: ctab, no inc wob ABD: soft, +bs EXT: no edema SKIN: no acute rash CN 2-12 wnl B, S/S/DTR wnl x4, not ttp in temporal area B

## 2016-01-04 NOTE — Patient Instructions (Signed)
Talk to cardiology about lipid options.   Restart celebrex in the meantime and see if that helps the headaches.  Go to the lab on the way out.  We'll contact you with your lab report (a1c, tsh, iron, cbc).  Take care.  Glad to see you.

## 2016-01-05 ENCOUNTER — Other Ambulatory Visit: Payer: Self-pay | Admitting: Cardiovascular Disease

## 2016-01-05 NOTE — Assessment & Plan Note (Signed)
See note on labs  

## 2016-01-05 NOTE — Assessment & Plan Note (Addendum)
Continue Celebrex as needed. No adverse event on medicine previously.

## 2016-01-05 NOTE — Assessment & Plan Note (Signed)
She was unable to tolerate statin. Discussed with patient.  statin for the meantime. she will discuss with cardiology about options. She'll stay off statin in the meantime. >25 minutes spent in face to face time with patient, >50% spent in counselling or coordination of care

## 2016-01-05 NOTE — Assessment & Plan Note (Signed)
She had a slow recovery. She is clearly improved from her status right after the event. It is unclear to me if the headaches that she has now are related to the previous concussion. At this point she didn't want to go on daily prophylactic medicine. This is reasonable. She can try Celebrex as needed in the meantime and see if that makes a difference. She doesn't have typical symptoms for temporal arteritis. She is okay for outpatient follow-up. She has an unremarkable neurologic exam today. She agrees with plan.

## 2016-01-14 ENCOUNTER — Other Ambulatory Visit: Payer: Self-pay | Admitting: Cardiology

## 2016-01-28 ENCOUNTER — Encounter: Payer: Self-pay | Admitting: Cardiovascular Disease

## 2016-01-30 ENCOUNTER — Telehealth: Payer: Self-pay | Admitting: Family Medicine

## 2016-01-30 DIAGNOSIS — E041 Nontoxic single thyroid nodule: Secondary | ICD-10-CM

## 2016-01-30 NOTE — Telephone Encounter (Signed)
Please call patient. I gave consideration to her situation regarding her previous thyroid nodules. It would likely be reasonable to get another ultrasound done, without biopsy. We can at least check the thyroid to make sure there is no significant change. We can then make plans after that extra information became available.    I went ahead and put in the order for the ultrasound. Please let me know if this is not okay. Thanks.

## 2016-01-31 NOTE — Telephone Encounter (Signed)
Thanks.  Ordered prev.

## 2016-01-31 NOTE — Telephone Encounter (Signed)
Patient advised and is agreeable.

## 2016-02-08 ENCOUNTER — Ambulatory Visit
Admission: RE | Admit: 2016-02-08 | Discharge: 2016-02-08 | Disposition: A | Discharge status: Home / Self Care | Payer: Medicare Other | Source: Ambulatory Visit | Attending: Family Medicine | Admitting: Family Medicine

## 2016-02-08 DIAGNOSIS — E041 Nontoxic single thyroid nodule: Secondary | ICD-10-CM

## 2016-02-11 ENCOUNTER — Encounter: Payer: Self-pay | Admitting: Cardiovascular Disease

## 2016-02-11 ENCOUNTER — Ambulatory Visit (INDEPENDENT_AMBULATORY_CARE_PROVIDER_SITE_OTHER): Payer: Medicare Other | Admitting: Cardiovascular Disease

## 2016-02-11 VITALS — BP 146/70 | HR 72 | Ht 66.0 in | Wt 174.0 lb

## 2016-02-11 DIAGNOSIS — I259 Chronic ischemic heart disease, unspecified: Secondary | ICD-10-CM | POA: Diagnosis not present

## 2016-02-11 DIAGNOSIS — Z79899 Other long term (current) drug therapy: Secondary | ICD-10-CM | POA: Diagnosis not present

## 2016-02-11 DIAGNOSIS — Z5181 Encounter for therapeutic drug level monitoring: Secondary | ICD-10-CM

## 2016-02-11 NOTE — Patient Instructions (Addendum)
Medication Instructions:  Your physician has recommended you make the following change in your medication:  1-STOP Brilinta  Labwork: Your physician recommends that you return for lab work in: 1 week for fasting lipid and liver panel.   Testing/Procedures: NONE  Follow-Up: Your physician wants you to follow-up in: 6 months with Dr. Eden EmmsNishan. You will receive a reminder letter in the mail two months in advance. If you don't receive a letter, please call our office to schedule the follow-up appointment.  Your physician recommends that you schedule a follow-up appointment with lipid clinic after lab work is done.   If you need a refill on your cardiac medications before your next appointment, please call your pharmacy.

## 2016-02-11 NOTE — Progress Notes (Signed)
Patient ID: Maria [name], female   DOB: Aug 01, 1940, 75 y.o.   MRN: [id number]    Cardiology Office Note Date:  02/11/2016  Patient ID:  Maria [name], DOB Jul 27, 1940, MRN [id number] PCP:  Crawford Givens, MD  Cardiologist:  [name]   Chief Complaint: f/u stenting  History of Present Illness: Maria [name] is a 75 y.o. female with history of CAD (s/p DES to [facility] 01/2015), HTN, DM, GERD, HLD (intolerant of multiple statins), SVT s/p ablation 06/2014   Per review of history, she underwent catheter ablation of a parahisian atrial tachycardia which was successfully ablated from the non-coronary cusp of the aortic root. She initially did well after her ablation but then began to experience dizziness, SOB, and chest heaviness. Event monitor showed pauses so beta blocker was stopped then restarted at lower dose. She had an abnormal stress test notable for circumflex stenosis that was initially managed medically, LVEF normal She continued to have angina thus was brought in for elective PCI 01/22/15 and underwent DES to [facility]; otherwise had 30% pRCA and 35% D1. DAPT was recommended for 1 year. HR and BP remained stable on current regimen.  Some chest pain with eating seen by GI Buccini History of GERD  Had f/u cath to r/o angina  06/10/15  Conclusion     Prox RCA lesion, 30% stenosed.  1st Diag lesion, 35% stenosed.  The left ventricular systolic function is normal.  1. Nonobstructive CAD. Continued patency of the stent in the LCx. 2. Normal LV function.  Continue medical therapy. Consider switching Brilinta to alternative antiplatelet agent.    Bilateral thyroid nodules by [name] 02/2015 stable Plan to possibly biopsy after DAT for a year  September 2017  Will change Brillinta to Plavix    Last visit 06/2015 norvasc stopped and cozar cut back     Past Medical History:  Diagnosis Date  . Anemia 05/1985  . Carotid artery disease (HCC)    a. 02/2015 - 1-39% bilaterally.  . Chronic back pain   .  Coronary artery disease    a. s/p DES to [facility] 01/2015.  . DDD (degenerative disc disease), cervical   . DDD (degenerative disc disease), lumbar   . Fracture of lower leg 07/2001   Right  . GERD (gastroesophageal reflux disease)   . Hyperlipidemia   . Hypertension   . Multinodular thyroid   . Pneumonia ?1990's X 1  . PSVT (paroxysmal supraventricular tachycardia) (HCC)    a. s/p catheter ablation of a parahisian atrial tachycardia which was successfully ablated from the non-coronary cusp of the aortic root 06/2014.  [name] Renal cyst 07/10/2006   bilateral  . Right knee pain 02/2010   Injected - Dr. Shelle Iron  . Sinus pause    a. By event monitoring 09/2014 - BB stopped at that time.  . Syncope and collapse 07/2001   "that's when I broke my leg"  . Type II diabetes mellitus (HCC) 2002    Past Surgical History:  Procedure Laterality Date  . ANTERIOR CERVICAL DECOMP/DISCECTOMY FUSION  10/03/2005   C4/5; C5/6  "it's got a plate in there"  . BACK SURGERY    . BREAST BIOPSY Bilateral 1968 (multiple)   "all benign"  . BREAST LUMPECTOMY Bilateral 1968  . CARDIAC CATHETERIZATION N/A 11/18/2014   Procedure: Left Heart Cath and Coronary Angiography;  Surgeon: Houston Zapien M Swaziland, MD;  Location: [facility] INVASIVE CV LAB;  Service: Cardiovascular;  Laterality: N/A;  . CARDIAC CATHETERIZATION N/A 01/22/2015   Procedure:  Coronary Stent Intervention;  Surgeon: Ellionna Buckbee M Swaziland, MD;  Location: [facility] INVASIVE CV LAB;  Service: Cardiovascular;  Laterality: N/A;  . CARDIAC CATHETERIZATION N/A 06/10/2015   Procedure: Left Heart Cath and Coronary Angiography;  Surgeon: Anjeli Casad M Swaziland, MD;  Location: [facility] INVASIVE CV LAB;  Service: Cardiovascular;  Laterality: N/A;  . CATARACT EXTRACTION W/ INTRAOCULAR LENS  IMPLANT, BILATERAL Bilateral 12/2010  . CORONARY ANGIOPLASTY    . ESOPHAGOGASTRODUODENOSCOPY  08/14/2005   gastropathy biopsy, negative  . LAPAROSCOPIC CHOLECYSTECTOMY  08/1992  . POSTERIOR LAMINECTOMY / DECOMPRESSION LUMBAR  SPINE  09/2001   due to herniated disc  . SUPRAVENTRICULAR TACHYCARDIA ABLATION N/A 07/06/2014   Procedure: SUPRAVENTRICULAR TACHYCARDIA ABLATION;  Surgeon: Marinus Maw, MD;  Location: [facility] CATH LAB;  Service: Cardiovascular;  Laterality: N/A;  . VAGINAL HYSTERECTOMY  1968   spotting    Current Outpatient Prescriptions  Medication Sig Dispense Refill  . aspirin EC 81 MG tablet Take 1 tablet (81 mg total) by mouth daily. 90 tablet 3  . atenolol (TENORMIN) 25 MG tablet Take 0.5 tablets (12.5 mg total) by mouth daily. 45 tablet 3  . celecoxib (CELEBREX) 200 MG capsule Take 1 capsule (200 mg total) by mouth daily as needed. 30 capsule 5  . glyBURIDE (DIABETA) 5 MG tablet TAKE 1 TABLET BY MOUTH EVERY MORNING AND 1/2 TO 1 TABLET EVERY EVENING. 180 tablet 1  . ibuprofen (ADVIL,MOTRIN) 200 MG tablet Take 400 mg by mouth every 8 (eight) hours as needed for mild pain or moderate pain.    . isosorbide mononitrate (IMDUR) 30 MG 24 hr tablet TAKE 1 TABLET (30 MG TOTAL) BY MOUTH DAILY. 90 tablet 1  . losartan (COZAAR) 25 MG tablet Take 1 tablet (25 mg total) by mouth daily. 90 tablet 3  . nitroGLYCERIN (NITROSTAT) 0.4 MG SL tablet Place 0.4 mg under the tongue every 5 (five) minutes as needed for chest pain (no more than 3 doses).    [name] omeprazole (PRILOSEC) 40 MG capsule Take 1 capsule by mouth daily as needed. Heartburn or acid reflux    . ticagrelor (BRILINTA) 90 MG TABS tablet Take 1 tablet (90 mg total) by mouth 2 (two) times daily. 60 tablet 10  . traMADol (ULTRAM) 50 MG tablet Take 1 tablet (50 mg total) by mouth every 12 (twelve) hours as needed (sedation caution). 60 tablet 1   No current facility-administered medications for this visit.     Allergies:   Acetaminophen; Carvedilol; Crestor [rosuvastatin]; Ezetimibe-simvastatin; Ibuprofen; Insulin glargine; Levemir [insulin detemir]; Macrobid [nitrofurantoin macrocrystal]; Metformin; Metoprolol tartrate; Naproxen; and Pravastatin   Social History:   The patient  reports that she has never smoked. She has never used smokeless tobacco. She reports that she does not drink alcohol or use drugs.   Family History:  The patient's family history includes Depression in her paternal aunt; Diabetes in her brother, maternal grandmother, paternal grandfather, paternal grandmother, and sister; Heart disease in her maternal grandfather and mother; Hypertension in her maternal grandfather, maternal grandmother, mother, paternal grandfather, and paternal grandmother; Myasthenia gravis in her sister; Stroke in her maternal grandmother.  ROS:  Please see the history of present illness. No headaches, visual changes, focal weakness reported. All other systems are reviewed and otherwise negative.   PHYSICAL EXAM:  VS:  BP (!) 146/70   Pulse 72   Ht 5\' 6"  (1.676 m)   Wt 78.9 kg (174 lb)   SpO2 97%   BMI 28.08 kg/m  BMI: Body mass index is 28.08  kg/m. Well nourished, well developed WF, in no acute distress  HEENT: normocephalic, atraumatic  Neck: no JVD, carotid bruits or masses Cardiac:  normal S1, S2; RRR; no murmurs, rubs, or gallops Lungs:  clear to auscultation bilaterally, no wheezing, rhonchi or rales  Abd: soft, nontender, no hepatomegaly, + BS MS: no deformity or atrophy Ext: no edema, right radial cath site completely healed, no ecchymosis or hematoma; good pulse Skin: warm and dry, no rash Neuro:  moves all extremities spontaneously, no focal abnormalities noted, follows commands Psych: euthymic mood, full affect  EKG:   NSR 65bpm, low voltage QRS, no acute changes  Recent Labs: 06/09/2015: ALT 14; BUN 18; Creat 0.97; Potassium 4.4; Sodium 136 01/04/2016: Hemoglobin 12.6; Platelets 380.0; TSH 2.92  06/09/2015: Cholesterol 157; HDL 31; LDL Cholesterol 100; Total CHOL/HDL Ratio 5.1; Triglycerides 130; VLDL 26   CrCl cannot be calculated (Patient's most recent lab result is older than the maximum 21 days allowed.).   Wt Readings from Last 3  Encounters:  02/11/16 78.9 kg (174 lb)  01/04/16 78.8 kg (173 lb 12 oz)  09/17/15 80.3 kg (177 lb)     Other studies reviewed: Additional studies/records reviewed today include: summarized above  ASSESSMENT AND PLAN:   CAD s/p PCI as above -    no restenosis  She gets dyspnea with Brillinta will change to ASA  And in future would need plavix   Dizziness - improved with cutting back BP meds   Essential HTN - Well controlled.  Continue current medications and low sodium Dash type diet.    HLD - refer to lipid clinic given multiple statin intolerances to consider PCSK-9 therapy. Will get Repeat labs next week She is willing to give herself a shot 2x/month   Paroxysmal SVT with history of sinus pause on event monitoring - see above.  Thyroid nodule: US this week stable nodule size no need for biopsy at this time   Disposition: f/u with lipid clinic Labs next week f/u me 6 months    Current medicines are reviewed at length with the patient today.  The patient did not have any concerns regarding medicines.  Charlton HawsPeter Horrace Hanak

## 2016-02-15 ENCOUNTER — Telehealth: Payer: Self-pay | Admitting: Family Medicine

## 2016-02-15 MED ORDER — SULFAMETHOXAZOLE-TRIMETHOPRIM 400-80 MG PO TABS: 1.0000 | ORAL_TABLET | Freq: Two times a day (BID) | ORAL | 0 refills | Status: DC

## 2016-02-15 NOTE — Telephone Encounter (Signed)
Patient advised.

## 2016-02-15 NOTE — Telephone Encounter (Signed)
Patient Name: Maria [name] DOB: 08/30/1940 Initial Comment Caller states she may have a bladder infection. Her husband is in the hospital, and would like something called in. Urination pain, frequent urination. Nurse Assessment Nurse: Charna Elizabethrumbull, RN, Cathy Date/Time (Eastern Time): 02/15/2016 11:48:36 AM Confirm and document reason for call. If symptomatic, describe symptoms. You must click the next button to save text entered. ---Maria [name] states she developed pain with urination yesterday. No fever. No blood in her urine. No flank pain. No injury in the past 3 days. Alert and responsive. Has the patient traveled out of the country within the last 30 days? ---No Does the patient have any new or worsening symptoms? ---Yes Will a triage be completed? ---Yes Related visit to physician within the last 2 weeks? ---Yes Does the PT have any chronic conditions? (i.e. diabetes, asthma, etc.) ---Yes List chronic conditions. ---Diabetes, High Blood Pressure and cholesterol, Cardiac Stent Is this a behavioral health or substance abuse call? ---No Guidelines Guideline Title Affirmed Question Affirmed Notes Urination Pain - Female Diabetes mellitus or weak immune system (e.g., HIV positive, cancer chemotherapy, transplant patient) Final Disposition User See Physician within 4 Hours (or PCP triage) Charna Elizabethrumbull, RN, Eula Friedathy Comments [name] shares that she can't see the MD today because she is going to need to be with her husband in the ER. She asks if MD can call in something for her. She states [facility]. Called the office backline and notified Reena who shares she will follow up with Dr. Para Marchuncan and call Maria [name] back. Referrals REFERRED TO PCP OFFICE Disagree/Comply:Disagree

## 2016-02-15 NOTE — Telephone Encounter (Signed)
See below.  Sent rx.  F/u if not better.  Hope they both get better soon.  Thanks.

## 2016-02-15 NOTE — Telephone Encounter (Signed)
Pt last seen by 01/04/16.

## 2016-02-21 ENCOUNTER — Other Ambulatory Visit: Payer: Medicare Other

## 2016-02-24 ENCOUNTER — Ambulatory Visit: Payer: Medicare Other

## 2016-03-29 ENCOUNTER — Ambulatory Visit (INDEPENDENT_AMBULATORY_CARE_PROVIDER_SITE_OTHER): Payer: Medicare Other | Admitting: Family Medicine

## 2016-03-29 ENCOUNTER — Encounter: Payer: Self-pay | Admitting: Family Medicine

## 2016-03-29 VITALS — BP 120/76 | HR 76 | Temp 97.6°F | Ht 66.0 in | Wt 170.0 lb

## 2016-03-29 DIAGNOSIS — M549 Dorsalgia, unspecified: Secondary | ICD-10-CM | POA: Diagnosis not present

## 2016-03-29 DIAGNOSIS — Z23 Encounter for immunization: Secondary | ICD-10-CM | POA: Diagnosis not present

## 2016-03-29 MED ORDER — TRAMADOL HCL 50 MG PO TABS: 50.0000 mg | ORAL_TABLET | Freq: Two times a day (BID) | ORAL | 3 refills | Status: DC | PRN

## 2016-03-29 NOTE — Progress Notes (Signed)
Pre visit review using our clinic review tool, if applicable. No additional management support is needed unless otherwise documented below in the visit note. 

## 2016-03-29 NOTE — Patient Instructions (Signed)
Take care.  Glad to see you.  Don't take ibuprofen with the celebrex.  Update me as needed.  Thanks for your effort.
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
## 2016-03-30 NOTE — Assessment & Plan Note (Signed)
>  25 minutes spent in face to face time with patient, >50% spent in counselling or coordination of care.  Home situation discussed with patient. She has been doing more around the house, helping her husband. This is likely exacerbated her underlying issues. Has used tramadol without adverse effect. Refill done. Continue as needed. She will update me as needed. Okay for outpatient follow-up. Discussed with her by getting some extra help at home. Fortunately she has AIDS and therapist coming in. Appreciate help of all involved for the patient and her husband.

## 2016-04-18 ENCOUNTER — Ambulatory Visit (INDEPENDENT_AMBULATORY_CARE_PROVIDER_SITE_OTHER): Payer: Medicare Other | Admitting: Family Medicine

## 2016-04-18 ENCOUNTER — Encounter: Payer: Self-pay | Admitting: Family Medicine

## 2016-04-18 VITALS — BP 110/69 | HR 63 | Temp 98.0°F | Wt 170.5 lb

## 2016-04-18 DIAGNOSIS — R300 Dysuria: Secondary | ICD-10-CM

## 2016-04-18 DIAGNOSIS — N3090 Cystitis, unspecified without hematuria: Secondary | ICD-10-CM

## 2016-04-18 LAB — POC URINALSYSI DIPSTICK (AUTOMATED)
BILIRUBIN UA: NEGATIVE
GLUCOSE UA: NEGATIVE
Ketones, UA: NEGATIVE
NITRITE UA: POSITIVE
PH UA: 6
Protein, UA: NEGATIVE
RBC UA: NEGATIVE
Spec Grav, UA: >=1.03
UROBILINOGEN UA: 0.2

## 2016-04-18 MED ORDER — SULFAMETHOXAZOLE-TRIMETHOPRIM 400-80 MG PO TABS: 1.0000 | ORAL_TABLET | Freq: Two times a day (BID) | ORAL | 0 refills | Status: DC

## 2016-04-18 MED ORDER — SULFAMETHOXAZOLE-TRIMETHOPRIM 800-160 MG PO TABS: 1.0000 | ORAL_TABLET | Freq: Two times a day (BID) | ORAL | 0 refills | Status: DC

## 2016-04-18 NOTE — Progress Notes (Signed)
Subjective:    Patient ID: Maria [name], female    DOB: 1940-10-07, 75 y.o.   MRN: [id number]  HPI This is a 75 yo female who presents today with one week of dysuria, urinary frequency, mild back pain today. No fever, no nausea or vomiting. Nocturia x 4-5 last night. Had a soda prior to symptoms starting- soda has triggered symptoms in past. Has pyridium at home, but didn't take. Had similar symptoms 1/17 with culture showing klebsiella, sensitive to septra ds.   Past Medical History:  Diagnosis Date  . Anemia 05/1985  . Carotid artery disease (HCC)    a. 02/2015 - 1-39% bilaterally.  . Chronic back pain   . Coronary artery disease    [facility]  . DDD (degenerative disc disease), cervical   . DDD (degenerative disc disease), lumbar   . Fracture of lower leg 07/2001   Right  . GERD (gastroesophageal reflux disease)   . Hyperlipidemia   . Hypertension   . Multinodular thyroid   . Pneumonia ?1990's X 1  . PSVT (paroxysmal supraventricular tachycardia) (HCC)    a. s/p catheter ablation of a parahisian atrial tachycardia which was successfully ablated from the non-coronary cusp of the aortic root 06/2014.  [name]. Renal cyst 07/10/2006   bilateral  . Right knee pain 02/2010   Injected - Dr. Shelle IronBeane  . Sinus pause    a. By event monitoring 09/2014 - BB stopped at that time.  . Syncope and collapse 07/2001   "that's when I broke my leg"  . Type II diabetes mellitus (HCC) 2002   Past Surgical History:  Procedure Laterality Date  . ANTERIOR CERVICAL DECOMP/DISCECTOMY FUSION  10/03/2005   C4/5; C5/6  "it's got a plate in there"  . BACK SURGERY    . BREAST BIOPSY Bilateral 1968 (multiple)   "all benign"  . BREAST LUMPECTOMY Bilateral 1968  . CARDIAC CATHETERIZATION N/A 11/18/2014   Procedure: Left Heart Cath and Coronary Angiography;  Surgeon: Peter M SwazilandJordan, MD;  [facility];  Service: Cardiovascular;  Laterality: N/A;  . CARDIAC CATHETERIZATION N/A  01/22/2015   Procedure: Coronary Stent Intervention;  Surgeon: Peter M SwazilandJordan, MD;  [facility];  Service: Cardiovascular;  Laterality: N/A;  . CARDIAC CATHETERIZATION N/A 06/10/2015   Procedure: Left Heart Cath and Coronary Angiography;  Surgeon: Peter M SwazilandJordan, MD;  [facility];  Service: Cardiovascular;  Laterality: N/A;  . CATARACT EXTRACTION W/ INTRAOCULAR LENS  IMPLANT, BILATERAL Bilateral 12/2010  . CORONARY ANGIOPLASTY    . ESOPHAGOGASTRODUODENOSCOPY  08/14/2005   gastropathy biopsy, negative  . LAPAROSCOPIC CHOLECYSTECTOMY  08/1992  . POSTERIOR LAMINECTOMY / DECOMPRESSION LUMBAR SPINE  09/2001   due to herniated disc  . SUPRAVENTRICULAR TACHYCARDIA ABLATION N/A 07/06/2014   Procedure: SUPRAVENTRICULAR TACHYCARDIA ABLATION;  Surgeon: Marinus MawGregg W Taylor, MD;  [facility];  Service: Cardiovascular;  Laterality: N/A;  . VAGINAL HYSTERECTOMY  1968   spotting   Family History  Problem Relation Age of Onset  . Hypertension Mother   . Heart disease Mother   . Diabetes Sister   . Myasthenia gravis Sister   . Diabetes Brother     1/2 juvenile; foot ulcer; periph. neuropathy  . Heart disease Maternal Grandfather     MI  . Hypertension Maternal Grandfather   . Depression Paternal Aunt   . Hypertension Maternal Grandmother   . Diabetes Maternal Grandmother   . Stroke Maternal Grandmother   .  Hypertension Paternal Grandmother   . Diabetes Paternal Grandmother   . Hypertension Paternal Grandfather   . Diabetes Paternal Grandfather    Social History  Substance Use Topics  . Smoking status: Never Smoker  . Smokeless tobacco: Never Used  . Alcohol use No      Review of Systems Per HPI   Objective:   Physical Exam  Constitutional: She is oriented to person, place, and time. She appears well-developed and well-nourished. No distress.  HENT:  Head: Normocephalic and atraumatic.  Eyes: Conjunctivae are normal.  Cardiovascular: Normal rate,  regular rhythm and normal heart sounds.   Pulmonary/Chest: Effort normal and breath sounds normal.  Abdominal: Soft. She exhibits no distension. There is no tenderness. There is no rebound, no guarding and no CVA tenderness.  Neurological: She is alert and oriented to person, place, and time.  Skin: Skin is warm and dry. She is not diaphoretic.  Psychiatric: She has a normal mood and affect. Her behavior is normal. Judgment and thought content normal.     BP 110/69 (BP Location: Right Arm, Patient Position: Sitting, Cuff Size: Normal)   Pulse 63   Temp 98 F (36.7 C) (Oral)   Wt 170 lb 8 oz (77.3 kg)   SpO2 98%   BMI 27.52 kg/m  Wt Readings from Last 3 Encounters:  04/18/16 170 lb 8 oz (77.3 kg)  03/29/16 170 lb (77.1 kg)  02/11/16 174 lb (78.9 kg)       Assessment & Plan:  1. Dysuria - POCT Urinalysis Dipstick (Automated)  2. Cystitis - non toxic appearance - Provided written and verbal information regarding diagnosis and treatment. - RTC precautions reviewed - Urine culture - sulfamethoxazole-trimethoprim (BACTRIM) 400-80 MG tablet; Take 1 tablet by mouth 2 (two) times daily.  Dispense: 14 tablet; Refill: 0   Olean Reeeborah Gessner, FNP-BC  [facility] [place], [facility]  04/18/2016 10:32 AM

## 2016-04-18 NOTE — Patient Instructions (Signed)

## 2016-04-18 NOTE — Progress Notes (Signed)
Pre visit review using our clinic review tool, if applicable. No additional management support is needed unless otherwise documented below in the visit note. 

## 2016-04-20 LAB — URINE CULTURE

## 2016-05-02 ENCOUNTER — Other Ambulatory Visit: Payer: Self-pay | Admitting: Physician Assistant

## 2016-07-05 ENCOUNTER — Encounter: Payer: Self-pay | Admitting: Family Medicine

## 2016-07-05 ENCOUNTER — Ambulatory Visit (INDEPENDENT_AMBULATORY_CARE_PROVIDER_SITE_OTHER): Payer: Medicare Other | Admitting: Family Medicine

## 2016-07-05 ENCOUNTER — Ambulatory Visit (INDEPENDENT_AMBULATORY_CARE_PROVIDER_SITE_OTHER): Payer: Medicare Other

## 2016-07-05 ENCOUNTER — Encounter (INDEPENDENT_AMBULATORY_CARE_PROVIDER_SITE_OTHER): Payer: Self-pay

## 2016-07-05 VITALS — BP 108/70 | HR 67 | Temp 98.0°F | Ht 64.5 in | Wt 171.5 lb

## 2016-07-05 DIAGNOSIS — R079 Chest pain, unspecified: Secondary | ICD-10-CM

## 2016-07-05 DIAGNOSIS — Z0000 Encounter for general adult medical examination without abnormal findings: Secondary | ICD-10-CM

## 2016-07-05 NOTE — Assessment & Plan Note (Signed)
She had one episdoe of NTG responsive CP, CP free now, not SOB now.  EKG w/o changes noted.  D/w pt.  Check routine labs, use NTG if needed, will ask for cards appointment to be moved up.  At this point, given the timeline, still okay for outpatient f/u.  She agrees. See notes on labs.  >25 minutes spent in face to face time with patient, >50% spent in counselling or coordination of care.

## 2016-07-05 NOTE — Patient Instructions (Signed)
Go to the lab on the way out.  We'll contact you with your lab report. Use NTG if needed, if chest pain.  If not resolved, then go to the ER.  Shirlee LimerickMarion will call about your referral. Take care.  Glad to see you.

## 2016-07-05 NOTE — Progress Notes (Signed)
Subjective:   Maria [name] is a 76 y.o. female who presents for an Initial Medicare Annual Wellness Visit.  Review of Systems    N/A  Cardiac Risk Factors include: advanced age (>38men, >8 women);dyslipidemia;diabetes mellitus;hypertension     Objective:    Today's Vitals   07/05/16 1436  BP: 108/70  Pulse: 67  Temp: 98 F (36.7 C)  TempSrc: Oral  SpO2: 95%  Weight: 171 lb 8 oz (77.8 kg)  Height: 5' 4.5" (1.638 m)  PainSc: 0-No pain   Body mass index is 28.98 kg/m.   Current Medications (verified) Outpatient Encounter Prescriptions as of 07/05/2016  Medication Sig  . aspirin EC 81 MG tablet Take 1 tablet (81 mg total) by mouth daily.  [name] atenolol (TENORMIN) 25 MG tablet TAKE 1/2 TABLET (12.5 MG TOTAL) BY MOUTH DAILY.  . celecoxib (CELEBREX) 200 MG capsule Take 1 capsule (200 mg total) by mouth daily as needed.  . glyBURIDE (DIABETA) 5 MG tablet TAKE 1 TABLET BY MOUTH EVERY MORNING AND 1/2 TO 1 TABLET EVERY EVENING.  [name] ibuprofen (ADVIL,MOTRIN) 200 MG tablet Take 400 mg by mouth every 8 (eight) hours as needed for mild pain or moderate pain.  . isosorbide mononitrate (IMDUR) 30 MG 24 hr tablet TAKE 1 TABLET (30 MG TOTAL) BY MOUTH DAILY.  [name] losartan (COZAAR) 25 MG tablet Take 1 tablet (25 mg total) by mouth daily.  . nitroGLYCERIN (NITROSTAT) 0.4 MG SL tablet Place 0.4 mg under the tongue every 5 (five) minutes as needed for chest pain (no more than 3 doses).  [name] omeprazole (PRILOSEC) 40 MG capsule Take 1 capsule by mouth daily as needed. Heartburn or acid reflux  . traMADol (ULTRAM) 50 MG tablet Take 1 tablet (50 mg total) by mouth every 12 (twelve) hours as needed (sedation caution).  . [DISCONTINUED] sulfamethoxazole-trimethoprim (BACTRIM) 400-80 MG tablet Take 1 tablet by mouth 2 (two) times daily.   No facility-administered encounter medications on file as of 07/05/2016.     Allergies (verified) Acetaminophen; Carvedilol; Crestor [rosuvastatin];  Ezetimibe-simvastatin; Ibuprofen; Insulin glargine; Levemir [insulin detemir]; Macrobid [nitrofurantoin macrocrystal]; Metformin; Metoprolol tartrate; Naproxen; and Pravastatin   History: Past Medical History:  Diagnosis Date  . Anemia 05/1985  . Carotid artery disease (HCC)    a. 02/2015 - 1-39% bilaterally.  . Chronic back pain   . Coronary artery disease    a. s/p DES to [facility] 01/2015.  . DDD (degenerative disc disease), cervical   . DDD (degenerative disc disease), lumbar   . Fracture of lower leg 07/2001   Right  . GERD (gastroesophageal reflux disease)   . Hyperlipidemia   . Hypertension   . Multinodular thyroid   . Pneumonia ?1990's X 1  . PSVT (paroxysmal supraventricular tachycardia) (HCC)    a. s/p catheter ablation of a parahisian atrial tachycardia which was successfully ablated from the non-coronary cusp of the aortic root 06/2014.  [name] Renal cyst 07/10/2006   bilateral  . Right knee pain 02/2010   Injected - Dr. Shelle Iron  . Sinus pause    a. By event monitoring 09/2014 - BB stopped at that time.  . Syncope and collapse 07/2001   "that's when I broke my leg"  . Type II diabetes mellitus (HCC) 2002   Past Surgical History:  Procedure Laterality Date  . ANTERIOR CERVICAL DECOMP/DISCECTOMY FUSION  10/03/2005   C4/5; C5/6  "it's got a plate in there"  . BACK SURGERY    . BREAST BIOPSY Bilateral 1968 (multiple)   "  all benign"  . BREAST LUMPECTOMY Bilateral 1968  . CARDIAC CATHETERIZATION N/A 11/18/2014   Procedure: Left Heart Cath and Coronary Angiography;  Surgeon: Peter M SwazilandJordan, MD;  [facility];  Service: Cardiovascular;  Laterality: N/A;  . CARDIAC CATHETERIZATION N/A 01/22/2015   Procedure: Coronary Stent Intervention;  Surgeon: Peter M SwazilandJordan, MD;  [facility];  Service: Cardiovascular;  Laterality: N/A;  . CARDIAC CATHETERIZATION N/A 06/10/2015   Procedure: Left Heart Cath and Coronary Angiography;  Surgeon: Peter M SwazilandJordan, MD;   [facility];  Service: Cardiovascular;  Laterality: N/A;  . CATARACT EXTRACTION W/ INTRAOCULAR LENS  IMPLANT, BILATERAL Bilateral 12/2010  . CORONARY ANGIOPLASTY    . ESOPHAGOGASTRODUODENOSCOPY  08/14/2005   gastropathy biopsy, negative  . LAPAROSCOPIC CHOLECYSTECTOMY  08/1992  . POSTERIOR LAMINECTOMY / DECOMPRESSION LUMBAR SPINE  09/2001   due to herniated disc  . SUPRAVENTRICULAR TACHYCARDIA ABLATION N/A 07/06/2014   Procedure: SUPRAVENTRICULAR TACHYCARDIA ABLATION;  Surgeon: Marinus MawGregg W Taylor, MD;  [facility];  Service: Cardiovascular;  Laterality: N/A;  . VAGINAL HYSTERECTOMY  1968   spotting   Family History  Problem Relation Age of Onset  . Hypertension Mother   . Heart disease Mother   . Diabetes Sister   . Myasthenia gravis Sister   . Diabetes Brother     1/2 juvenile; foot ulcer; periph. neuropathy  . Heart disease Maternal Grandfather     MI  . Hypertension Maternal Grandfather   . Depression Paternal Aunt   . Hypertension Maternal Grandmother   . Diabetes Maternal Grandmother   . Stroke Maternal Grandmother   . Hypertension Paternal Grandmother   . Diabetes Paternal Grandmother   . Hypertension Paternal Grandfather   . Diabetes Paternal Grandfather   . Breast cancer Maternal Aunt   . Colon cancer Neg Hx    Social History   Occupational History  . Retired; [employer]asing [employer]    Works a couple days a week   Social History Main Topics  . Smoking status: Never Smoker  . Smokeless tobacco: Never Used  . Alcohol use No  . Drug use: No  . Sexual activity: Yes    Tobacco Counseling Counseling given: No   Activities of Daily Living In your present state of health, do you have any difficulty performing the following activities: 07/05/2016  Hearing? N  Vision? N  Difficulty concentrating or making decisions? N  Walking or climbing stairs? N  Dressing or bathing? N  Doing errands, shopping? N  Preparing  Food and eating ? N  Using the Toilet? N  In the past six months, have you accidently leaked urine? N  Do you have problems with loss of bowel control? N  Managing your Medications? N  Managing your Finances? N  Housekeeping or managing your Housekeeping? N  Some recent data might be hidden    Immunizations and Health Maintenance Immunization History  Administered Date(s) Administered  . Influenza Split 02/22/2012  . Influenza Whole 02/21/2006, 02/13/2007, 02/07/2008, 02/17/2009, 02/25/2010  . Influenza,inj,Quad PF,36+ Mos 01/23/2015, 03/29/2016  . Influenza-Unspecified 02/26/2014  . Pneumococcal Conjugate-13 12/18/2013  . Pneumococcal Polysaccharide-23 07/17/2000, 02/11/2015  . Td 05/15/1996, 01/15/2008  . Zoster 10/02/2011   There are no preventive care reminders to display for this patient.  Patient Care Team: Joaquim NamGraham S Duncan, MD as PCP - General     Assessment:   This is a routine wellness examination for EsmondBarbara.   Hearing/Vision screen  Hearing Screening   125Hz  250Hz  500Hz  1000Hz  2000Hz  3000Hz  4000Hz  6000Hz  8000Hz   Right ear:   40 40 40  40    Left ear:   0 0 0  0    Vision Screening Comments: Last vision exam in Jan 2017  Dietary issues and exercise activities discussed: Current Exercise Habits: The patient does not participate in regular exercise at present, Exercise limited by: None identified  Goals    . Reduce carbs, increase protein          Starting 07/05/2016, I will monitor intake of simple carbohydrates and increase intake of healthy proteins in an effort to lose 10 lbs.       Depression Screen PHQ 2/9 Scores 07/05/2016  PHQ - 2 Score 0    Fall Risk Fall Risk  07/05/2016  Falls in the past year? No    Cognitive Function: MMSE - Mini Mental State Exam 07/05/2016 07/05/2016  Orientation to time 5 5  Orientation to Place 5 5  Registration 3 3  Attention/ Calculation 0 0  Recall 3 3  Language- name 2 objects 0 0  Language- repeat 1 1    Language- follow 3 step command 3 3  Language- read & follow direction 0 0  Write a sentence 0 0  Copy design 0 0  Total score 20 20       PLEASE NOTE: A Mini-Cog screen was completed. Maximum score is 20. A value of 0 denotes this part of Folstein MMSE was not completed or the patient failed this part of the Mini-Cog screening.   Mini-Cog Screening Orientation to Time - Max 5 pts Orientation to Place - Max 5 pts Registration - Max 3 pts Recall - Max 3 pts Language Repeat - Max 1 pts Language Follow 3 Step Command - Max 3 pts   Screening Tests Health Maintenance  Topic Date Due  . HEMOGLOBIN A1C  07/06/2016  . OPHTHALMOLOGY EXAM  07/13/2016  . FOOT EXAM  09/16/2016  . COLONOSCOPY  04/08/2017  . TETANUS/TDAP  01/14/2018  . INFLUENZA VACCINE  Completed  . DEXA SCAN  Completed  . PNA vac Low Risk Adult  Completed      Plan:     I have personally reviewed and addressed the Medicare Annual Wellness questionnaire and have noted the following in the patient's chart:  A. Medical and social history B. Use of alcohol, tobacco or illicit drugs  C. Current medications and supplements D. Functional ability and status E.  Nutritional status F.  Physical activity G. Advance directives H. List of other physicians I.  Hospitalizations, surgeries, and ER visits in previous 12 months J.  Vitals K. Screenings to include hearing, vision, cognitive, depression L. Referrals and appointments - none  In addition, I have reviewed and discussed with patient certain preventive protocols, quality metrics, and best practice recommendations. A written personalized care plan for preventive services as well as general preventive health recommendations were provided to patient.  See attached scanned questionnaire for additional information.   Signed,   Randa Evens, MHA, BS, LPN Health Coach'

## 2016-07-05 NOTE — Progress Notes (Signed)
Pre visit review using our clinic review tool, if applicable. No additional management support is needed unless otherwise documented below in the visit note. 

## 2016-07-05 NOTE — Progress Notes (Signed)
Hearing test failed.  D/w pt.  Declined hearing aids.    CAD s/p stent.  She has f/u with cardiology pending for spring 2018.   Fatigue noted.  She had a "spell" and didn't feel comfortable.  She was hurting from her chest through to her back.  She took a NTG and it helped quickly.   This episode was last week.  She has had other episodes in the meantime that didn't require NTG use.  No syncope.  No CP now.    Hasn't checked sugar everyday, but hasn't been running high usually.    She has f/u pending with GI to discuss colonoscopy.   Mammogram and DXA per gyn.    HCPOA d/w pt.  Daughter and husband both equally designated if patient were incapacitated.  It would be okay for either to make decisions on behalf of the patient in an emergency.    PMH and SH reviewed  Meds, vitals, and allergies reviewed.   ROS: Per HPI unless specifically indicated in ROS section   GEN: nad, alert and oriented HEENT: mucous membranes moist NECK: supple w/o LA CV: rrr. PULM: ctab, no inc wob ABD: soft, +bs EXT: no edema SKIN: no acute rash  EKG w/o sig change.  D/w pt.

## 2016-07-05 NOTE — Progress Notes (Signed)
PCP notes:   Health maintenance:  No gaps identified.   Abnormal screenings:   Hearing -  failed  Patient concerns:   Patient will be appointment with PCP today to discuss chest pain.   Nurse concerns:  None  Next PCP appt:   07/05/16 @ 1500  I reviewed health advisor's note, was available for consultation on the day of service listed in this note, and agree with documentation and plan.  Other issues were addressed at my appointment with the patient, not all other routine health issues, since she had a recently episode of chest pain.  See following office visit note.  Crawford GivensGraham Duncan, MD.

## 2016-07-05 NOTE — Patient Instructions (Signed)
Ms. Dot BeenDark , Thank you for taking time to come for your Medicare Wellness Visit. I appreciate your ongoing commitment to your health goals. Please review the following plan we discussed and let me know if I can assist you in the future.   These are the goals we discussed: Goals    . Reduce carbs, increase protein          Starting 07/05/2016, I will monitor intake of simple carbohydrates and increase intake of healthy proteins in an effort to lose 10 lbs.        This is a list of the screening recommended for you and due dates:  Health Maintenance  Topic Date Due  . Hemoglobin A1C  07/06/2016  . Eye exam for diabetics  07/13/2016  . Complete foot exam   09/16/2016  . Colon Cancer Screening  04/08/2017  . Tetanus Vaccine  01/14/2018  . Flu Shot  Completed  . DEXA scan (bone density measurement)  Completed  . Pneumonia vaccines  Completed   Preventive Care for Adults  A healthy lifestyle and preventive care can promote health and wellness. Preventive health guidelines for adults include the following key practices.  . A routine yearly physical is a good way to check with your health care provider about your health and preventive screening. It is a chance to share any concerns and updates on your health and to receive a thorough exam.  . Visit your dentist for a routine exam and preventive care every 6 months. Brush your teeth twice a day and floss once a day. Good oral hygiene prevents tooth decay and gum disease.  . The frequency of eye exams is based on your age, health, family medical history, use  of contact lenses, and other factors. Follow your health care provider's ecommendations for frequency of eye exams.  . Eat a healthy diet. Foods like vegetables, fruits, whole grains, low-fat dairy products, and lean protein foods contain the nutrients you need without too many calories. Decrease your intake of foods high in solid fats, added sugars, and salt. Eat the right amount of  calories for you. Get information about a proper diet from your health care provider, if necessary.  . Regular physical exercise is one of the most important things you can do for your health. Most adults should get at least 150 minutes of moderate-intensity exercise (any activity that increases your heart rate and causes you to sweat) each week. In addition, most adults need muscle-strengthening exercises on 2 or more days a week.  Silver Sneakers may be a benefit available to you. To determine eligibility, you may visit the website: www.silversneakers.com or contact program at (845)[phone] Mon-Fri between 8AM-8PM.   . Maintain a healthy weight. The body mass index (BMI) is a screening tool to identify possible weight problems. It provides an estimate of body fat based on height and weight. Your health care provider can find your BMI and can help you achieve or maintain a healthy weight.   For adults 20 years and older: ? A BMI below 18.5 is considered underweight. ? A BMI of 18.5 to 24.9 is normal. ? A BMI of 25 to 29.9 is considered overweight. ? A BMI of 30 and above is considered obese.   . Maintain normal blood lipids and cholesterol levels by exercising and minimizing your intake of saturated fat. Eat a balanced diet with plenty of fruit and vegetables. Blood tests for lipids and cholesterol should begin at age 77 and be repeated  every 5 years. If your lipid or cholesterol levels are high, you are over 50, or you are at high risk for heart disease, you may need your cholesterol levels checked more frequently. Ongoing high lipid and cholesterol levels should be treated with medicines if diet and exercise are not working.  . If you smoke, find out from your health care provider how to quit. If you do not use tobacco, please do not start.  . If you choose to drink alcohol, please do not consume more than 2 drinks per day. One drink is considered to be 12 ounces (355 mL) of beer, 5 ounces  (148 mL) of wine, or 1.5 ounces (44 mL) of liquor.  . If you are 74-53 years old, ask your health care provider if you should take aspirin to prevent strokes.  . Use sunscreen. Apply sunscreen liberally and repeatedly throughout the day. You should seek shade when your shadow is shorter than you. Protect yourself by wearing long sleeves, pants, a wide-brimmed hat, and sunglasses year round, whenever you are outdoors.  . Once a month, do a whole body skin exam, using a mirror to look at the skin on your back. Tell your health care provider of new moles, moles that have irregular borders, moles that are larger than a pencil eraser, or moles that have changed in shape or color.

## 2016-07-06 LAB — COMPREHENSIVE METABOLIC PANEL
ALK PHOS: 89 U/L (ref 39–117)
ALT: 12 U/L (ref 0–35)
AST: 16 U/L (ref 0–37)
Albumin: 4.2 g/dL (ref 3.5–5.2)
BUN: 22 mg/dL (ref 6–23)
CO2: 27 mEq/L (ref 19–32)
Calcium: 9.8 mg/dL (ref 8.4–10.5)
Chloride: 108 mEq/L (ref 96–112)
Creatinine, Ser: 1.01 mg/dL (ref 0.40–1.20)
GFR: 56.5 mL/min — ABNORMAL LOW (ref >60.00)
GLUCOSE: 66 mg/dL — AB (ref 70–99)
Potassium: 4.2 mEq/L (ref 3.5–5.1)
SODIUM: 140 meq/L (ref 135–145)
TOTAL PROTEIN: 6.5 g/dL (ref 6.0–8.3)
Total Bilirubin: 0.4 mg/dL (ref 0.2–1.2)

## 2016-07-06 LAB — CBC WITH DIFFERENTIAL/PLATELET
BASOS PCT: 0.8 % (ref 0.0–3.0)
Basophils Absolute: 0.1 10*3/uL (ref 0.0–0.1)
Eosinophils Absolute: 1.2 10*3/uL — ABNORMAL HIGH (ref 0.0–0.7)
Eosinophils Relative: 12.5 % — ABNORMAL HIGH (ref 0.0–5.0)
HCT: 36.9 % (ref 36.0–46.0)
Hemoglobin: 12.6 g/dL (ref 12.0–15.0)
LYMPHS ABS: 3.2 10*3/uL (ref 0.7–4.0)
Lymphocytes Relative: 32.6 % (ref 12.0–46.0)
MCHC: 34 g/dL (ref 30.0–36.0)
MCV: 89 fl (ref 78.0–100.0)
MONO ABS: 0.7 10*3/uL (ref 0.1–1.0)
Monocytes Relative: 7.4 % (ref 3.0–12.0)
NEUTROS PCT: 46.7 % (ref 43.0–77.0)
Neutro Abs: 4.6 10*3/uL (ref 1.4–7.7)
Platelets: 404 10*3/uL — ABNORMAL HIGH (ref 150.0–400.0)
RBC: 4.15 Mil/uL (ref 3.87–5.11)
RDW: 14 % (ref 11.5–15.5)
WBC: 9.8 10*3/uL (ref 4.0–10.5)

## 2016-07-06 LAB — LIPID PANEL
CHOL/HDL RATIO: 5
Cholesterol: 192 mg/dL (ref 0–200)
HDL: 38.6 mg/dL — ABNORMAL LOW (ref >39.00)
LDL Cholesterol: 135 mg/dL — ABNORMAL HIGH (ref 0–99)
NONHDL: 152.97
Triglycerides: 92 mg/dL (ref 0.0–149.0)
VLDL: 18.4 mg/dL (ref 0.0–40.0)

## 2016-07-06 LAB — HEMOGLOBIN A1C: Hgb A1c MFr Bld: 6.7 % — ABNORMAL HIGH (ref 4.6–6.5)

## 2016-07-06 LAB — TSH: TSH: 2.98 u[IU]/mL (ref 0.35–4.50)

## 2016-07-10 NOTE — Progress Notes (Signed)
CARDIOLOGY OFFICE NOTE  Date:  07/11/2016    Maria [name] Date of Birth: [birth year] Medical Record #[id number]  PCP:  Crawford Givens, MD  Cardiologist:  [name]  Chief Complaint  Patient presents with  . Coronary Artery Disease    Follow up visit - seen for Dr. [name]    History of Present Illness: Maria [name] is a 76 y.o. female who presents today for a work in visit. Seen for Dr. [name].   She has a history of CAD (s/p DES to [facility] 01/2015), HTN, DM, GERD, HLD (intolerant of multiple statins), & SVT s/p ablation 06/2014. Following the ablation, she had dizziness, SOB and chest heaviness. Event monitor showed pauses and her beta blocker was stopped and then able to be restarted at a lower dose. Abnormal stress test then with cardiac cath showing circumflex stenosis that was to initially be managed medically but with failure on medical therapy - DES to the LCX was placed.  Had f/u cath to r/o angina  06/10/15.   She was last seen back in September and felt to be doing ok. Looks like she was to go to the lipid clinic - this did not happen.   Saw Dr. Para March last week - she was there for her check up but reported some atypical symptoms and was asked to come back here.   Comes in today. Here alone. She has had recurrent chest pain - radiating to her back - it has woke her up at night. She did not get to the lipid clinic - husband got "crushed" by a tractor back in September and she has been taking care of him - neglecting herself understandably. She now wants to get back to the lipid clinic. She is upset that her appointment in April has been cancelled with Dr. [name] and would like it added back.  Her chest pain is now happening more often - seems to have been a chronic finding and describes as "a funny pain" but tightness that will radiate thru to her back. Sometimes worse with being active in the yard. She took NTG last week - this is rare for her to do - she got very prompt relief.  Will have a few spells every week. Last spell on Saturday.  Spells typically last 10 to 15 minutes. She has been fatigued but attributes this to taking care of her husband. Some transient dizziness - but no syncope - on very low beta blocker. She has felt like she has had more palpitations that she attributes to stress.   Past Medical History:  Diagnosis Date  . Anemia 05/1985  . Carotid artery disease (HCC)    a. 02/2015 - 1-39% bilaterally.  . Chronic back pain   . Coronary artery disease    a. s/p DES to [facility] 01/2015.  . DDD (degenerative disc disease), cervical   . DDD (degenerative disc disease), lumbar   . Fracture of lower leg 07/2001   Right  . GERD (gastroesophageal reflux disease)   . Hyperlipidemia   . Hypertension   . Multinodular thyroid   . Pneumonia ?1990's X 1  . PSVT (paroxysmal supraventricular tachycardia) (HCC)    a. s/p catheter ablation of a parahisian atrial tachycardia which was successfully ablated from the non-coronary cusp of the aortic root 06/2014.  [name] Renal cyst 07/10/2006   bilateral  . Right knee pain 02/2010   Injected - Dr. Shelle Iron  . Sinus pause    a. By event  monitoring 09/2014 - BB stopped at that time.  . Syncope and collapse 07/2001   "that's when I broke my leg"  . Type II diabetes mellitus (HCC) 2002    Past Surgical History:  Procedure Laterality Date  . ANTERIOR CERVICAL DECOMP/DISCECTOMY FUSION  10/03/2005   C4/5; C5/6  "it's got a plate in there"  . BACK SURGERY    . BREAST BIOPSY Bilateral 1968 (multiple)   "all benign"  . BREAST LUMPECTOMY Bilateral 1968  . CARDIAC CATHETERIZATION N/A 11/18/2014   Procedure: Left Heart Cath and Coronary Angiography;  Surgeon: Peter M Swaziland, MD;  Location: [facility] INVASIVE CV LAB;  Service: Cardiovascular;  Laterality: N/A;  . CARDIAC CATHETERIZATION N/A 01/22/2015   Procedure: Coronary Stent Intervention;  Surgeon: Peter M Swaziland, MD;  Location: [facility] INVASIVE CV LAB;  Service: Cardiovascular;  Laterality: N/A;    . CARDIAC CATHETERIZATION N/A 06/10/2015   Procedure: Left Heart Cath and Coronary Angiography;  Surgeon: Peter M Swaziland, MD;  Location: [facility] INVASIVE CV LAB;  Service: Cardiovascular;  Laterality: N/A;  . CATARACT EXTRACTION W/ INTRAOCULAR LENS  IMPLANT, BILATERAL Bilateral 12/2010  . CORONARY ANGIOPLASTY    . ESOPHAGOGASTRODUODENOSCOPY  08/14/2005   gastropathy biopsy, negative  . LAPAROSCOPIC CHOLECYSTECTOMY  08/1992  . POSTERIOR LAMINECTOMY / DECOMPRESSION LUMBAR SPINE  09/2001   due to herniated disc  . SUPRAVENTRICULAR TACHYCARDIA ABLATION N/A 07/06/2014   Procedure: SUPRAVENTRICULAR TACHYCARDIA ABLATION;  Surgeon: Marinus Maw, MD;  Location: [facility] CATH LAB;  Service: Cardiovascular;  Laterality: N/A;  . VAGINAL HYSTERECTOMY  1968   spotting     Medications: Current Outpatient Prescriptions  Medication Sig Dispense Refill  . aspirin EC 81 MG tablet Take 81 mg by mouth 2 (two) times daily.    [name] atenolol (TENORMIN) 25 MG tablet TAKE 1/2 TABLET (12.5 MG TOTAL) BY MOUTH DAILY. 45 tablet 2  . celecoxib (CELEBREX) 200 MG capsule Take 1 capsule (200 mg total) by mouth daily as needed. 30 capsule 5  . glyBURIDE (DIABETA) 5 MG tablet TAKE 1 TABLET BY MOUTH EVERY MORNING AND 1/2 TO 1 TABLET EVERY EVENING. 180 tablet 1  . ibuprofen (ADVIL,MOTRIN) 200 MG tablet Take 400 mg by mouth every 8 (eight) hours as needed for mild pain or moderate pain.    [name] losartan (COZAAR) 25 MG tablet Take 1 tablet (25 mg total) by mouth daily. 90 tablet 3  . nitroGLYCERIN (NITROSTAT) 0.4 MG SL tablet Place 0.4 mg under the tongue every 5 (five) minutes as needed for chest pain (no more than 3 doses).    [name] omeprazole (PRILOSEC) 40 MG capsule Take 1 capsule by mouth daily. Heartburn or acid reflux    . traMADol (ULTRAM) 50 MG tablet Take 1 tablet (50 mg total) by mouth every 12 (twelve) hours as needed (sedation caution). 60 tablet 3  . isosorbide mononitrate (IMDUR) 60 MG 24 hr tablet Take 1 tablet (60 mg total) by  mouth daily. 90 tablet 3   No current facility-administered medications for this visit.     Allergies: Allergies  Allergen Reactions  . Acetaminophen     REACTION: jittery with plain tylenol, but tolerated tylenol/benadryl combination  . Carvedilol     REACTION: nightmares  . Crestor [Rosuvastatin]     aches  . Ezetimibe-Simvastatin     REACTION: muscle aches  . Ibuprofen     REACTION: GI upset  . Insulin Glargine     Rash  . Levemir [Insulin Detemir]     rash  .  Macrobid [Nitrofurantoin Macrocrystal] Other (See Comments)    Achey, N & V  . Metformin     REACTION: GI upset  . Metoprolol Tartrate     REACTION: nightmares  . Naproxen     REACTION: GI upset  . Pravastatin Other (See Comments)    Myalgias    Social History: The patient  reports that she has never smoked. She has never used smokeless tobacco. She reports that she does not drink alcohol or use drugs.   Family History: The patient's family history includes Breast cancer in her maternal aunt; Depression in her paternal aunt; Diabetes in her brother, maternal grandmother, paternal grandfather, paternal grandmother, and sister; Heart disease in her maternal grandfather and mother; Hypertension in her maternal grandfather, maternal grandmother, mother, paternal grandfather, and paternal grandmother; Myasthenia gravis in her sister; Stroke in her maternal grandmother.   Review of Systems: Please see the history of present illness.   Otherwise, the review of systems is positive for none.   All other systems are reviewed and negative.   Physical Exam: VS:  BP 132/76   Pulse 69   Ht 5' 4.5" (1.638 m)   Wt 172 lb (78 kg)   BMI 29.07 kg/m  .  BMI Body mass index is 29.07 kg/m.  Wt Readings from Last 3 Encounters:  07/11/16 172 lb (78 kg)  07/05/16 171 lb 8 oz (77.8 kg)  07/05/16 171 lb 8 oz (77.8 kg)    General: Pleasant. Well developed, well nourished and in no acute distress.   HEENT: Normal.  Neck:  Supple, no JVD, carotid bruits, or masses noted.  Cardiac: Regular rate and rhythm. No murmurs, rubs, or gallops. No edema.  Respiratory:  Lungs are clear to auscultation bilaterally with normal work of breathing.  GI: Soft and nontender.  MS: No deformity or atrophy. Gait and ROM intact.  Skin: Warm and dry. Color is normal.  Neuro:  Strength and sensation are intact and no gross focal deficits noted.  Psych: Alert, appropriate and with normal affect.   LABORATORY DATA:  EKG:  EKG is ordered today. This demonstrates NSR with RBBB and LAFB - this bifascicular block looks new.  Lab Results  Component Value Date   WBC 9.8 07/05/2016   HGB 12.6 07/05/2016   HCT 36.9 07/05/2016   PLT 404.0 (H) 07/05/2016   GLUCOSE 66 (L) 07/05/2016   CHOL 192 07/05/2016   TRIG 92.0 07/05/2016   HDL 38.60 (L) 07/05/2016   LDLDIRECT 168.1 02/18/2010   LDLCALC 135 (H) 07/05/2016   ALT 12 07/05/2016   AST 16 07/05/2016   NA 140 07/05/2016   K 4.2 07/05/2016   CL 108 07/05/2016   CREATININE 1.01 07/05/2016   BUN 22 07/05/2016   CO2 27 07/05/2016   TSH 2.98 07/05/2016   INR 1.00 06/09/2015   HGBA1C 6.7 (H) 07/05/2016   MICROALBUR 0.5 02/16/2009    BNP (last 3 results) No results for input(s): BNP in the last 8760 hours.  ProBNP (last 3 results) No results for input(s): PROBNP in the last 8760 hours.   Other Studies Reviewed Today:     Conclusion     Prox RCA lesion, 30% stenosed.  1st Diag lesion, 35% stenosed.  The left ventricular systolic function is normal.  1. Nonobstructive CAD. Continued patency of the stent in the LCx. 2. Normal LV function.  Continue medical therapy. Consider switching Brilinta to alternative antiplatelet agent.    Assessment/Plan:  1. CAD with prior PCI -  last cath from 05/2015 was stable - she has had dyspnea with Brilinta - now with more spells of chest tightness - she has used NTG with prompt relief - may have small vessel disease - her last cath  from a little over a year ago is reassuring - I would hold on cath at this time. Will try medical management with increasing Imdur to 60 mg and taking PPI every day. I will see her back. Will also get her back to see Dr. [name] as well per her request.   2. Dizziness - sounds vague - no syncope.   3. HTN - BP ok on her current regimen.   4. HLD - will get her back to the Lipid clinic  5. Prior SVT/ablation - some palpitations. Bifascicular block - looks new. Only on low dose beta blocker - no syncope - will follow.   Current medicines are reviewed with the patient today.  The patient does not have concerns regarding medicines other than what has been noted above.  The following changes have been made:  See above.  Labs/ tests ordered today include:    Orders Placed This Encounter  Procedures  . EKG 12-Lead     Disposition:   FU with me in about 2 weeks. Will get her back to see Dr. [name] as well in April per her request.   Patient is agreeable to this plan and will call if any problems develop in the interim.   SignedNorma Fredrickson, NP  07/11/2016 12:18 PM  [facility] [address] [place], [place]  [address] Phone: [phone] Fax: [phone]

## 2016-07-11 ENCOUNTER — Ambulatory Visit (INDEPENDENT_AMBULATORY_CARE_PROVIDER_SITE_OTHER): Payer: Medicare Other | Admitting: Nurse Practitioner

## 2016-07-11 ENCOUNTER — Encounter: Payer: Self-pay | Admitting: Nurse Practitioner

## 2016-07-11 VITALS — BP 132/76 | HR 69 | Ht 64.5 in | Wt 172.0 lb

## 2016-07-11 DIAGNOSIS — I259 Chronic ischemic heart disease, unspecified: Secondary | ICD-10-CM

## 2016-07-11 MED ORDER — ISOSORBIDE MONONITRATE ER 60 MG PO TB24: 60.0000 mg | ORAL_TABLET | Freq: Every day | ORAL | 3 refills | Status: DC

## 2016-07-11 NOTE — Patient Instructions (Addendum)
We will be checking the following labs today - None   Medication Instructions:    Continue with your current medicines. BUT  I want you to take your Prilosec every day for 2 weeks  I am increasing the Imdur to 60 mg a day - you can take 2 of your 30 mg tablets to = this dose.     Testing/Procedures To Be Arranged:  N/A  Follow-Up:   See me in 2 weeks  Will get you a visit back with Dr. Eden EmmsNishan as well.   Let's get you to the Lipid clinic here    Other Special Instructions:   N/A    If you need a refill on your cardiac medications before your next appointment, please call your pharmacy.   Call the [facility] office at [phone] if you have any questions, problems or concerns.

## 2016-07-19 ENCOUNTER — Ambulatory Visit: Payer: Medicare Other | Admitting: Nurse Practitioner

## 2016-07-20 ENCOUNTER — Encounter: Payer: Self-pay | Admitting: Internal Medicine

## 2016-07-20 ENCOUNTER — Ambulatory Visit (INDEPENDENT_AMBULATORY_CARE_PROVIDER_SITE_OTHER): Payer: Medicare Other | Admitting: Internal Medicine

## 2016-07-20 VITALS — BP 126/70 | HR 67 | Temp 97.7°F | Wt 173.5 lb

## 2016-07-20 DIAGNOSIS — R300 Dysuria: Secondary | ICD-10-CM

## 2016-07-20 DIAGNOSIS — N3000 Acute cystitis without hematuria: Secondary | ICD-10-CM | POA: Diagnosis not present

## 2016-07-20 LAB — POC URINALSYSI DIPSTICK (AUTOMATED)
Bilirubin, UA: NEGATIVE
Blood, UA: NEGATIVE
Glucose, UA: NEGATIVE
KETONES UA: NEGATIVE
Leukocytes, UA: NEGATIVE
NITRITE UA: NEGATIVE
PROTEIN UA: NEGATIVE
Spec Grav, UA: >=1.03
Urobilinogen, UA: NEGATIVE
pH, UA: 6

## 2016-07-20 MED ORDER — CIPROFLOXACIN HCL 250 MG PO TABS: 250.0000 mg | ORAL_TABLET | Freq: Two times a day (BID) | ORAL | 0 refills | Status: DC

## 2016-07-20 NOTE — Patient Instructions (Signed)

## 2016-07-20 NOTE — Progress Notes (Signed)
HPI  Pt presents to the clinic today with c/o dysuria. This started 1 week ago. She denies urgency, frequency or blood in her urine. She has been pushing fluids with only minimal relief. She had a UTI 04/2016. Culture grew out E Coli. She was treated with Septra.    Review of Systems  Past Medical History:  Diagnosis Date  . Anemia 05/1985  . Carotid artery disease (HCC)    a. 02/2015 - 1-39% bilaterally.  . Chronic back pain   . Coronary artery disease    a. s/p DES to [facility] 01/2015.  . DDD (degenerative disc disease), cervical   . DDD (degenerative disc disease), lumbar   . Fracture of lower leg 07/2001   Right  . GERD (gastroesophageal reflux disease)   . Hyperlipidemia   . Hypertension   . Multinodular thyroid   . Pneumonia ?1990's X 1  . PSVT (paroxysmal supraventricular tachycardia) (HCC)    a. s/p catheter ablation of a parahisian atrial tachycardia which was successfully ablated from the non-coronary cusp of the aortic root 06/2014.  [name] Renal cyst 07/10/2006   bilateral  . Right knee pain 02/2010   Injected - Dr. Shelle Iron  . Sinus pause    a. By event monitoring 09/2014 - BB stopped at that time.  . Syncope and collapse 07/2001   "that's when I broke my leg"  . Type II diabetes mellitus (HCC) 2002    Family History  Problem Relation Age of Onset  . Hypertension Mother   . Heart disease Mother   . Diabetes Sister   . Myasthenia gravis Sister   . Diabetes Brother     1/2 juvenile; foot ulcer; periph. neuropathy  . Heart disease Maternal Grandfather     MI  . Hypertension Maternal Grandfather   . Depression Paternal Aunt   . Hypertension Maternal Grandmother   . Diabetes Maternal Grandmother   . Stroke Maternal Grandmother   . Hypertension Paternal Grandmother   . Diabetes Paternal Grandmother   . Hypertension Paternal Grandfather   . Diabetes Paternal Grandfather   . Breast cancer Maternal Aunt   . Colon cancer Neg Hx     Social History   Social History  .  Marital status: Married    Spouse name: N/A  . Number of children: 2  . Years of education: N/A   Occupational History  . Retired; Hotel manager, [employer]    Works a couple days a week   Social History Main Topics  . Smoking status: Never Smoker  . Smokeless tobacco: Never Used  . Alcohol use No  . Drug use: No  . Sexual activity: Yes   Other Topics Concern  . Not on file   Social History Narrative   Widowed 2011 after 52 years of marriage (husband had C.diff)   Remarried 2015    Allergies  Allergen Reactions  . Acetaminophen     REACTION: jittery with plain tylenol, but tolerated tylenol/benadryl combination  . Carvedilol     REACTION: nightmares  . Crestor [Rosuvastatin]     aches  . Ezetimibe-Simvastatin     REACTION: muscle aches  . Ibuprofen     REACTION: GI upset  . Insulin Glargine     Rash  . Levemir [Insulin Detemir]     rash  . Macrobid [Nitrofurantoin Macrocrystal] Other (See Comments)    Achey, N & V  . Metformin     REACTION: GI upset  . Metoprolol Tartrate  REACTION: nightmares  . Naproxen     REACTION: GI upset  . Pravastatin Other (See Comments)    Myalgias     Constitutional: Denies fever, malaise, fatigue, headache or abrupt weight changes.   GU: Pt reports dysuria. Denies urgency, frequency, burning sensation, blood in urine, odor or discharge.   No other specific complaints in a complete review of systems (except as listed in HPI above).    Objective:   Physical Exam  BP 126/70   Pulse 67   Temp 97.7 F (36.5 C) (Oral)   Wt 173 lb 8 oz (78.7 kg)   SpO2 98%   BMI 29.32 kg/m   Wt Readings from Last 3 Encounters:  07/11/16 172 lb (78 kg)  07/05/16 171 lb 8 oz (77.8 kg)  07/05/16 171 lb 8 oz (77.8 kg)    General: Appears her stated age, well developed, well nourished in NAD. Pulmonary/Chest: Normal effort and positive vesicular breath sounds. No respiratory distress. No wheezes, rales or  ronchi noted.  Abdomen: Soft. Normal bowel sounds. No distention or masses noted.  Tender to palpation over the bladder area. No CVA tenderness.       Assessment & Plan:  Dysuria secondary to UTI:  Urinalysis: 2+ leuks Will send urine culture eRx sent if for Cipro 250 mg BID x 7 days OK to take AZO OTC Drink plenty of fluids  RTC as needed or if symptoms persist. Nicki ReaperBAITY, Uziel Covault, NP

## 2016-07-20 NOTE — Addendum Note (Signed)
Addended by: Roena MaladyEVONTENNO, Lula Michaux Y on: 07/20/2016 10:28 AM   Modules accepted: Orders

## 2016-07-22 LAB — URINE CULTURE

## 2016-07-24 ENCOUNTER — Ambulatory Visit: Payer: Medicare Other | Admitting: Nurse Practitioner

## 2016-07-24 NOTE — Progress Notes (Signed)
CARDIOLOGY OFFICE NOTE  Date:  07/25/2016    Maria [name] Date of Birth: [birth year] Medical Record #[id number]  PCP:  Crawford Givens, MD  Cardiologist:  Townsend Roger    Chief Complaint  Patient presents with  . Chest Pain    Follow up visit - seen for Dr. [name]    History of Present Illness: Maria [name] is a 76 y.o. female who presents today for a follow up visit. Seen for Dr. [name].   She has a history of CAD (s/p DES to [facility] 01/2015), HTN, DM, GERD, HLD (intolerant of multiple statins), & SVT s/p ablation 06/2014. Following the ablation, she had dizziness, SOB and chest heaviness. Event monitor showed pauses and her beta blocker was stopped and then able to be restarted at a lower dose. Abnormal stress test then with cardiac cath showing circumflex stenosis that was to initially be managed medically but with failure on medical therapy - DES to the LCX was placed.  Had f/u cath to r/o angina 06/10/15.   She was last seen back in September and felt to be doing ok. Looks like she was to go to the lipid clinic - this did not happen.   Saw Dr. Para March last month - she was there for her check up but reported some atypical symptoms/chest pain and was asked to come back here.   I then saw her back - she was very upset that her visit with Dr. [name] had been cancelled by the schedulers. Her husband had gotten "crushed" by a tractor back in September and she had been taking care of him. Their life was pretty stressful. She was having more chest pain. Had used NTG with prompt relief. We opted for continued medical therapy and I increased Imdur to her regimen and had her take her PPI every day - it was my impression that there could be some degree of small vessel disease.   Comes in today. Here alone. She says she is about the same. Had to go back to 30 mg of Imdur due to headache - did not try at night which has been successful with in the past. Has had 2 or 3 spells since last  here - last one was night before last - happened while in bed watching TV - seemed to be more in her back. No NTG used. It quickly went away - within in two or three. She has had some while exerting herself in the yard. She notes easy fatigue. For mammogram later this week. She thinks but is not sure that she was having less chest pain when on the higher dose of nitrate. Husband having bladder surgery in about a month [facility].    Past Medical History:  Diagnosis Date  . Anemia 05/1985  . Carotid artery disease (HCC)    a. 02/2015 - 1-39% bilaterally.  . Chronic back pain   . Coronary artery disease    a. s/p DES to [facility] 01/2015.  . DDD (degenerative disc disease), cervical   . DDD (degenerative disc disease), lumbar   . Fracture of lower leg 07/2001   Right  . GERD (gastroesophageal reflux disease)   . Hyperlipidemia   . Hypertension   . Multinodular thyroid   . Pneumonia ?1990's X 1  . PSVT (paroxysmal supraventricular tachycardia) (HCC)    a. s/p catheter ablation of a parahisian atrial tachycardia which was successfully ablated from the non-coronary cusp of the aortic root 06/2014.  [name]  Renal cyst 07/10/2006   bilateral  . Right knee pain 02/2010   Injected - Dr. Shelle IronBeane  . Sinus pause    a. By event monitoring 09/2014 - BB stopped at that time.  . Syncope and collapse 07/2001   "that's when I broke my leg"  . Type II diabetes mellitus (HCC) 2002    Past Surgical History:  Procedure Laterality Date  . ANTERIOR CERVICAL DECOMP/DISCECTOMY FUSION  10/03/2005   C4/5; C5/6  "it's got a plate in there"  . BACK SURGERY    . BREAST BIOPSY Bilateral 1968 (multiple)   "all benign"  . BREAST LUMPECTOMY Bilateral 1968  . CARDIAC CATHETERIZATION N/A 11/18/2014   Procedure: Left Heart Cath and Coronary Angiography;  Surgeon: Peter M SwazilandJordan, MD;  [facility];  Service: Cardiovascular;  Laterality: N/A;  . CARDIAC CATHETERIZATION N/A 01/22/2015   Procedure: Coronary Stent  Intervention;  Surgeon: Peter M SwazilandJordan, MD;  [facility];  Service: Cardiovascular;  Laterality: N/A;  . CARDIAC CATHETERIZATION N/A 06/10/2015   Procedure: Left Heart Cath and Coronary Angiography;  Surgeon: Peter M SwazilandJordan, MD;  [facility];  Service: Cardiovascular;  Laterality: N/A;  . CATARACT EXTRACTION W/ INTRAOCULAR LENS  IMPLANT, BILATERAL Bilateral 12/2010  . CORONARY ANGIOPLASTY    . ESOPHAGOGASTRODUODENOSCOPY  08/14/2005   gastropathy biopsy, negative  . LAPAROSCOPIC CHOLECYSTECTOMY  08/1992  . POSTERIOR LAMINECTOMY / DECOMPRESSION LUMBAR SPINE  09/2001   due to herniated disc  . SUPRAVENTRICULAR TACHYCARDIA ABLATION N/A 07/06/2014   Procedure: SUPRAVENTRICULAR TACHYCARDIA ABLATION;  Surgeon: Marinus MawGregg W Taylor, MD;  [facility];  Service: Cardiovascular;  Laterality: N/A;  . VAGINAL HYSTERECTOMY  1968   spotting     Medications: Current Outpatient Prescriptions  Medication Sig Dispense Refill  . aspirin EC 81 MG tablet Take 81 mg by mouth 2 (two) times daily.    [name]. atenolol (TENORMIN) 25 MG tablet TAKE 1/2 TABLET (12.5 MG TOTAL) BY MOUTH DAILY. 45 tablet 2  . celecoxib (CELEBREX) 200 MG capsule Take 1 capsule (200 mg total) by mouth daily as needed. 30 capsule 5  . ciprofloxacin (CIPRO) 250 MG tablet Take 1 tablet (250 mg total) by mouth 2 (two) times daily. 14 tablet 0  . glyBURIDE (DIABETA) 5 MG tablet TAKE 1 TABLET BY MOUTH EVERY MORNING AND 1/2 TO 1 TABLET EVERY EVENING. 180 tablet 1  . ibuprofen (ADVIL,MOTRIN) 200 MG tablet Take 400 mg by mouth every 8 (eight) hours as needed for mild pain or moderate pain.    . isosorbide mononitrate (IMDUR) 30 MG 24 hr tablet Take 30 mg by mouth daily.   2  . losartan (COZAAR) 25 MG tablet Take 1 tablet (25 mg total) by mouth daily. 90 tablet 3  . nitroGLYCERIN (NITROSTAT) 0.4 MG SL tablet Place 0.4 mg under the tongue every 5 (five) minutes as needed for chest pain (no more than 3 doses).    [name].  omeprazole (PRILOSEC) 40 MG capsule Take 1 capsule by mouth daily. Heartburn or acid reflux    . traMADol (ULTRAM) 50 MG tablet Take 1 tablet (50 mg total) by mouth every 12 (twelve) hours as needed (sedation caution). 60 tablet 3   No current facility-administered medications for this visit.     Allergies: Allergies  Allergen Reactions  . Acetaminophen     REACTION: jittery with plain tylenol, but tolerated tylenol/benadryl combination  . Carvedilol     REACTION: nightmares  . Crestor [Rosuvastatin]  aches  . Ezetimibe-Simvastatin     REACTION: muscle aches  . Ibuprofen     REACTION: GI upset  . Insulin Glargine     Rash  . Levemir [Insulin Detemir]     rash  . Macrobid [Nitrofurantoin Macrocrystal] Other (See Comments)    Achey, N & V  . Metformin     REACTION: GI upset  . Metoprolol Tartrate     REACTION: nightmares  . Naproxen     REACTION: GI upset  . Pravastatin Other (See Comments)    Myalgias    Social History: The patient  reports that she has never smoked. She has never used smokeless tobacco. She reports that she does not drink alcohol or use drugs.   Family History: The patient's family history includes Breast cancer in her maternal aunt; Depression in her paternal aunt; Diabetes in her brother, maternal grandmother, paternal grandfather, paternal grandmother, and sister; Heart disease in her maternal grandfather and mother; Hypertension in her maternal grandfather, maternal grandmother, mother, paternal grandfather, and paternal grandmother; Myasthenia gravis in her sister; Stroke in her maternal grandmother.   Review of Systems: Please see the history of present illness.   Otherwise, the review of systems is positive for none.   All other systems are reviewed and negative.   Physical Exam: VS:  BP 136/80   Pulse 76   Ht 5' 4.5" (1.638 m)   Wt 172 lb 12.8 oz (78.4 kg)   BMI 29.20 kg/m  .  BMI Body mass index is 29.2 kg/m.  Wt Readings from Last 3  Encounters:  07/25/16 172 lb 12.8 oz (78.4 kg)  07/20/16 173 lb 8 oz (78.7 kg)  07/11/16 172 lb (78 kg)    General: Pleasant. Well developed, well nourished and in no acute distress.   HEENT: Normal.  Neck: Supple, no JVD, carotid bruits, or masses noted.  Cardiac: Regular rate and rhythm. No murmurs, rubs, or gallops. No edema.  Respiratory:  Lungs are clear to auscultation bilaterally with normal work of breathing.  GI: Soft and nontender.  MS: No deformity or atrophy. Gait and ROM intact.  Skin: Warm and dry. Color is normal.  Neuro:  Strength and sensation are intact and no gross focal deficits noted.  Psych: Alert, appropriate and with normal affect.   LABORATORY DATA:  EKG:  EKG is not ordered today.  Lab Results  Component Value Date   WBC 9.8 07/05/2016   HGB 12.6 07/05/2016   HCT 36.9 07/05/2016   PLT 404.0 (H) 07/05/2016   GLUCOSE 66 (L) 07/05/2016   CHOL 192 07/05/2016   TRIG 92.0 07/05/2016   HDL 38.60 (L) 07/05/2016   LDLDIRECT 168.1 02/18/2010   LDLCALC 135 (H) 07/05/2016   ALT 12 07/05/2016   AST 16 07/05/2016   NA 140 07/05/2016   K 4.2 07/05/2016   CL 108 07/05/2016   CREATININE 1.01 07/05/2016   BUN 22 07/05/2016   CO2 27 07/05/2016   TSH 2.98 07/05/2016   INR 1.00 06/09/2015   HGBA1C 6.7 (H) 07/05/2016   MICROALBUR 0.5 02/16/2009    BNP (last 3 results) No results for input(s): BNP in the last 8760 hours.  ProBNP (last 3 results) No results for input(s): PROBNP in the last 8760 hours.   Other Studies Reviewed Today:  Cardiac Cath Conclusion1/2017    Prox RCA lesion, 30% stenosed.  1st Diag lesion, 35% stenosed.  The left ventricular systolic function is normal.  1. Nonobstructive CAD. Continued patency of the  stent in the LCx. 2. Normal LV function.  Continue medical therapy. Consider switching Brilinta to alternative antiplatelet agent.    Assessment/Plan:  1. CAD with prior PCI - last cath from 05/2015 was stable -  she has had dyspnea with Brilinta - now with more spells of chest tightness - she has used NTG with prompt relief - may have small vessel disease - her last cath from a little over a year ago is reassuring - I have held off on repeating her cath or arranging for Myoview. She would like to still see if medical management helps - she has had better luck with taking nitrates at night - will do 30 mg in the AM and the extra 30 mg at night and see if she improves. She has follow up with Dr. [name].   2. Dizziness - sounds vague - no syncope. Not really noted today.   3. HTN - BP ok on her current regimen.   4. HLD - going to the lipid clinic today  5. Prior SVT/ablation - some palpitations. Bifascicular block on prior EKG - looked new. Only on low dose beta blocker - no syncope - will follow.    Current medicines are reviewed with the patient today.  The patient does not have concerns regarding medicines other than what has been noted above.  The following changes have been made:  See above.  Labs/ tests ordered today include:   No orders of the defined types were placed in this encounter.    Disposition:   FU with Dr. [name] in 6 weeks.  i  Patient is agreeable to this plan and will call if any problems develop in the interim.   SignedNorma Fredrickson, NP  07/25/2016 2:33 PM  [facility] [address] [place], [place]  [address] Phone: [phone] Fax: [phone]

## 2016-07-25 ENCOUNTER — Ambulatory Visit: Payer: Medicare Other

## 2016-07-25 ENCOUNTER — Ambulatory Visit (INDEPENDENT_AMBULATORY_CARE_PROVIDER_SITE_OTHER): Payer: Medicare Other | Admitting: Pharmacist

## 2016-07-25 ENCOUNTER — Encounter: Payer: Self-pay | Admitting: Nurse Practitioner

## 2016-07-25 ENCOUNTER — Ambulatory Visit (INDEPENDENT_AMBULATORY_CARE_PROVIDER_SITE_OTHER): Payer: Medicare Other | Admitting: Nurse Practitioner

## 2016-07-25 VITALS — BP 136/80 | HR 76 | Ht 64.5 in | Wt 172.8 lb

## 2016-07-25 DIAGNOSIS — E785 Hyperlipidemia, unspecified: Secondary | ICD-10-CM

## 2016-07-25 DIAGNOSIS — R0789 Other chest pain: Secondary | ICD-10-CM

## 2016-07-25 DIAGNOSIS — I259 Chronic ischemic heart disease, unspecified: Secondary | ICD-10-CM | POA: Diagnosis not present

## 2016-07-25 MED ORDER — EVOLOCUMAB 140 MG/ML ~~LOC~~ SOAJ: 1.0000 "pen " | SUBCUTANEOUS | 11 refills | Status: DC

## 2016-07-25 NOTE — Patient Instructions (Addendum)
We will be checking the following labs today - NONE   Medication Instructions:    Continue with your current medicines. BUT  I want you to try taking the Isosorbide - 30 mg in the am and then 30 mg at night - hopefully this will minimize the headache.     Testing/Procedures To Be Arranged:  N/A  Follow-Up:   See Dr. Eden EmmsNishan as planned.     Other Special Instructions:   N/A    If you need a refill on your cardiac medications before your next appointment, please call your pharmacy.   Call the [facility] office at [phone] if you have any questions, problems or concerns.

## 2016-07-25 NOTE — Progress Notes (Signed)
Patient ID: Maria [name]                 DOB: July 11, 1940                    MRN: [id number]     HPI: Maria [name] is a 76 y.o. female patient of Dr Eden EmmsNishan referred to lipid clinic by Norma FredricksonLori Gerhardt, NP. PMH is significant for CAD [facility], HTN, DM, angina, GERD, HLD, and SVT s/p ablation in 2016. Patient has a history of statin intolerance and presents to lipid clinic for further management.  Pt has previously taken pravastatin 20mg  daily, Crestor 5mg  every other day and 3x per week, and Vytorin. She experienced generalized muscle aches that felt like the flu on each statin. Symptoms would present after about 1 week of therapy and would resolve within a few weeks of statin discontinuation each time. She follows a low fat diet and tries to stay active.  Current Medications: none Intolerances: pravastatin 20mg  daily - myalgias, Crestor 5mg  every other day and 3x per week - myalgias, Vytorin - myalgias Risk Factors: CAD s/p PCI, DM, angina LDL goal: 70mg /dL  Diet: Cereal or toast for breakfast with coffee. Lunch - peanut butter crackers, apple, or leftovers. Dinner- protein and vegetable. Does not eat meat frequently. Does not fry her food.   Exercise: Stays active caring for her husband and doing yard work. Wants to start walking more.  Family History: The patient's family history includes Diabetes in her brother, maternal grandmother, paternal grandfather, paternal grandmother, and sister; Heart disease in her maternal grandfather and mother; Hypertension in her maternal grandfather, maternal grandmother, mother, paternal grandfather, and paternal grandmother; Myasthenia gravis in her sister; Stroke in her maternal grandmother.   Social History: The patient  reports that she has never smoked. She has never used smokeless tobacco. She reports that she does not drink alcohol or use drugs.  Labs: 07/05/2016: TC 192, TG 92, HDL 38.6, LDL 135 (no therapy)  Past Medical History:    Diagnosis Date  . Anemia 05/1985  . Carotid artery disease (HCC)    a. 02/2015 - 1-39% bilaterally.  . Chronic back pain   . Coronary artery disease    a. [facility].  . DDD (degenerative disc disease), cervical   . DDD (degenerative disc disease), lumbar   . Fracture of lower leg 07/2001   Right  . GERD (gastroesophageal reflux disease)   . Hyperlipidemia   . Hypertension   . Multinodular thyroid   . Pneumonia ?1990's X 1  . PSVT (paroxysmal supraventricular tachycardia) (HCC)    a. s/p catheter ablation of a parahisian atrial tachycardia which was successfully ablated from the non-coronary cusp of the aortic root 06/2014.  [name]. Renal cyst 07/10/2006   bilateral  . Right knee pain 02/2010   Injected - Dr. Shelle IronBeane  . Sinus pause    a. By event monitoring 09/2014 - BB stopped at that time.  . Syncope and collapse 07/2001   "that's when I broke my leg"  . Type II diabetes mellitus (HCC) 2002    Current Outpatient Prescriptions on File Prior to Visit  Medication Sig Dispense Refill  . aspirin EC 81 MG tablet Take 81 mg by mouth 2 (two) times daily.    [name]. atenolol (TENORMIN) 25 MG tablet TAKE 1/2 TABLET (12.5 MG TOTAL) BY MOUTH DAILY. 45 tablet 2  . celecoxib (CELEBREX) 200 MG capsule Take 1 capsule (  200 mg total) by mouth daily as needed. 30 capsule 5  . ciprofloxacin (CIPRO) 250 MG tablet Take 1 tablet (250 mg total) by mouth 2 (two) times daily. 14 tablet 0  . glyBURIDE (DIABETA) 5 MG tablet TAKE 1 TABLET BY MOUTH EVERY MORNING AND 1/2 TO 1 TABLET EVERY EVENING. 180 tablet 1  . ibuprofen (ADVIL,MOTRIN) 200 MG tablet Take 400 mg by mouth every 8 (eight) hours as needed for mild pain or moderate pain.    . isosorbide mononitrate (IMDUR) 60 MG 24 hr tablet Take 1 tablet (60 mg total) by mouth daily. 90 tablet 3  . losartan (COZAAR) 25 MG tablet Take 1 tablet (25 mg total) by mouth daily. 90 tablet 3  . nitroGLYCERIN (NITROSTAT) 0.4 MG SL tablet Place 0.4 mg under the tongue  every 5 (five) minutes as needed for chest pain (no more than 3 doses).    [name] omeprazole (PRILOSEC) 40 MG capsule Take 1 capsule by mouth daily. Heartburn or acid reflux    . traMADol (ULTRAM) 50 MG tablet Take 1 tablet (50 mg total) by mouth every 12 (twelve) hours as needed (sedation caution). 60 tablet 3   No current facility-administered medications on file prior to visit.     Allergies  Allergen Reactions  . Acetaminophen     REACTION: jittery with plain tylenol, but tolerated tylenol/benadryl combination  . Carvedilol     REACTION: nightmares  . Crestor [Rosuvastatin]     aches  . Ezetimibe-Simvastatin     REACTION: muscle aches  . Ibuprofen     REACTION: GI upset  . Insulin Glargine     Rash  . Levemir [Insulin Detemir]     rash  . Macrobid [Nitrofurantoin Macrocrystal] Other (See Comments)    Achey, N & V  . Metformin     REACTION: GI upset  . Metoprolol Tartrate     REACTION: nightmares  . Naproxen     REACTION: GI upset  . Pravastatin Other (See Comments)    Myalgias    Assessment/Plan:  1. Hyperlipidemia - LDL 135 above goal 70mg /dL given history of CAD s/p PCI. Pt is intolerant to lowest doses of pravastatin and rosuvastatin plus simvastatin/ezetimibe. [id number] is the only option to bring LDL to goal. Discussed expected benefits, side effects, and injection technique of Repatha. Will submit prior authorization. Pt is aware copay will likely be expensive since she has Medicare part D. She will not qualify for patient assistance due to income.   Megan E. Supple, PharmD, CPP, BCACP [facility] [address], [place], [place] [address] Phone: [phone]; Fax: [phone] 07/25/2016 3:19 PM   Addendum: prior authorization for Repatha has already been approved. Rx sent to [facility] specialty pharmacy. Will call pt with copay information once available.

## 2016-07-27 ENCOUNTER — Telehealth: Payer: Self-pay | Admitting: Pharmacist

## 2016-07-27 NOTE — Telephone Encounter (Signed)
PA approved for Repatha. Called [facility] specialty pharmacy for copay - affordable at $100 per month. LMOM for pt to return call to see if she would like to schedule appt to give first injection in clinic.

## 2016-08-10 LAB — HM DIABETES EYE EXAM

## 2016-08-14 ENCOUNTER — Ambulatory Visit: Payer: Medicare Other | Admitting: Cardiovascular Disease

## 2016-08-14 NOTE — Progress Notes (Signed)
Patient ID: Maria [name], female   DOB: 1941/04/09, 76 y.o.   MRN: [id number]    Cardiology Office Note Date:  08/16/2016  Patient ID:  Maria [name], DOB February 26, 1941, MRN [id number] PCP:  Maria Givens, MD  Cardiologist:  Maria [name]   Chief Complaint: f/u stenting  History of Present Illness: Maria [name] is a 76 y.o. female with history of CAD (s/p DES to [facility] 01/2015), HTN, DM, GERD, HLD (intolerant of multiple statins), SVT s/p ablation 06/2014   Per review of history, she underwent catheter ablation of a parahisian atrial tachycardia which was successfully ablated from the non-coronary cusp of the aortic root. She initially did well after her ablation but then began to experience dizziness, SOB, and chest heaviness. Event monitor showed pauses so beta blocker was stopped then restarted at lower dose. She had an abnormal stress test notable for circumflex stenosis that was initially managed medically, LVEF normal She continued to have angina thus was brought in for elective PCI 01/22/15 and underwent DES to [facility]; otherwise had 30% pRCA and 35% D1. DAPT was recommended for 1 year. HR and BP remained stable on current regimen.  Some chest pain with eating seen by GI Buccini History of GERD  Had f/u cath to r/o angina  06/10/15  Conclusion     Prox RCA lesion, 30% stenosed.  1st Diag lesion, 35% stenosed.  The left ventricular systolic function is normal.  1. Nonobstructive CAD. Continued patency of the stent in the LCx. 2. Normal LV function.  Continue medical therapy. Consider switching Brilinta to alternative antiplatelet agent.    Bilateral thyroid nodules by [name] 02/2015 stable Biopsy benign Brillinta changed to plavix with less dyspnea  Seen by PA 07/30/16 and had some angina imdur increased to bid. Still with some Chest pain left sided with sharp quality but responsive to nitro not always exertional  Followed in lipid clinic and approved for repatha  Husband was run over by his  own tractor and she has had a lot of stress caring for him They have been married 3 years but still have separate houses as she doesn't want To live in [place]    Past Medical History:  Diagnosis Date  . Anemia 05/1985  . Carotid artery disease (HCC)    a. 02/2015 - 1-39% bilaterally.  . Chronic back pain   . Coronary artery disease    a. s/p DES to [facility] 01/2015.  . DDD (degenerative disc disease), cervical   . DDD (degenerative disc disease), lumbar   . Fracture of lower leg 07/2001   Right  . GERD (gastroesophageal reflux disease)   . Hyperlipidemia   . Hypertension   . Multinodular thyroid   . Pneumonia ?1990's X 1  . PSVT (paroxysmal supraventricular tachycardia) (HCC)    a. s/p catheter ablation of a parahisian atrial tachycardia which was successfully ablated from the non-coronary cusp of the aortic root 06/2014.  [name] Renal cyst 07/10/2006   bilateral  . Right knee pain 02/2010   Injected - Dr. Shelle Iron  . Sinus pause    a. By event monitoring 09/2014 - BB stopped at that time.  . Syncope and collapse 07/2001   "that's when I broke my leg"  . Type II diabetes mellitus (HCC) 2002    Past Surgical History:  Procedure Laterality Date  . ANTERIOR CERVICAL DECOMP/DISCECTOMY FUSION  10/03/2005   C4/5; C5/6  "it's got a plate in there"  . BACK SURGERY    .  BREAST BIOPSY Bilateral 1968 (multiple)   "all benign"  . BREAST LUMPECTOMY Bilateral 1968  . CARDIAC CATHETERIZATION N/A 11/18/2014   Procedure: Left Heart Cath and Coronary Angiography;  Surgeon: Braxden Lovering M [place]az[place], MD;  Location[facility] INVASIVE CV LAB;  Service: Cardiovascular;  Laterality: N/A;  . CARDIAC CATHETERIZATION N/A 01/22/2015   Procedure: Coronary Stent Intervention;  Surgeon: Raye Slyter M [place], MD;  Location: [facility] INVASIVE CV LAB;  Service: Cardiovascular;  Laterality: N/A;  . CARDIAC CATHETERIZATION N/A 06/10/2015   Procedure: Left Heart Cath and Coronary Angiography;  Surgeon: Crosley Stejskal M [place], MD;  Location: [facility] INVASIVE CV  LAB;  Service: Cardiovascular;  Laterality: N/A;  . CATARACT EXTRACTION W/ INTRAOCULAR LENS  IMPLANT, BILATERAL Bilateral 12/2010  . CORONARY ANGIOPLASTY    . ESOPHAGOGASTRODUODENOSCOPY  08/14/2005   gastropathy biopsy, negative  . LAPAROSCOPIC CHOLECYSTECTOMY  08/1992  . POSTERIOR LAMINECTOMY / DECOMPRESSION LUMBAR SPINE  09/2001   due to herniated disc  . SUPRAVENTRICULAR TACHYCARDIA ABLATION N/A 07/06/2014   Procedure: SUPRAVENTRICULAR TACHYCARDIA ABLATION;  Surgeon: Marinus Maw, MD;  Location: [facility] CATH LAB;  Service: Cardiovascular;  Laterality: N/A;  . VAGINAL HYSTERECTOMY  1968   spotting    Current Outpatient Prescriptions  Medication Sig Dispense Refill  . aspirin EC 81 MG tablet Take 81 mg by mouth 2 (two) times daily.    [name] atenolol (TENORMIN) 25 MG tablet TAKE 1/2 TABLET (12.5 MG TOTAL) BY MOUTH DAILY. 45 tablet 2  . celecoxib (CELEBREX) 200 MG capsule Take 1 capsule (200 mg total) by mouth daily as needed. 30 capsule 5  . glyBURIDE (DIABETA) 5 MG tablet TAKE 1 TABLET BY MOUTH EVERY MORNING AND 1/2 TO 1 TABLET EVERY EVENING. 180 tablet 1  . ibuprofen (ADVIL,MOTRIN) 200 MG tablet Take 400 mg by mouth every 8 (eight) hours as needed for mild pain or moderate pain.    . isosorbide mononitrate (IMDUR) 30 MG 24 hr tablet Take 30 mg by mouth daily.   2  . losartan (COZAAR) 25 MG tablet Take 1 tablet (25 mg total) by mouth daily. 90 tablet 3  . nitroGLYCERIN (NITROSTAT) 0.4 MG SL tablet Place 0.4 mg under the tongue every 5 (five) minutes as needed for chest pain (no more than 3 doses).    [name] omeprazole (PRILOSEC) 40 MG capsule Take 1 capsule by mouth daily. Heartburn or acid reflux    . traMADol (ULTRAM) 50 MG tablet Take 1 tablet (50 mg total) by mouth every 12 (twelve) hours as needed (sedation caution). 60 tablet 3  . Evolocumab (REPATHA SURECLICK) 140 MG/ML SOAJ Inject 1 pen into the skin every 14 (fourteen) days. (Patient not taking: Reported on 08/16/2016) 2 pen 11   No current  facility-administered medications for this visit.     Allergies:   Acetaminophen; Carvedilol; Crestor [rosuvastatin]; Ezetimibe-simvastatin; Ibuprofen; Insulin glargine; Levemir [insulin detemir]; Macrobid [nitrofurantoin macrocrystal]; Metformin; Metoprolol tartrate; Naproxen; and Pravastatin   Social History:  The patient  reports that she has never smoked. She has never used smokeless tobacco. She reports that she does not drink alcohol or use drugs.   Family History:  The patient's family history includes Breast cancer in her maternal aunt; Depression in her paternal aunt; Diabetes in her brother, maternal grandmother, paternal grandfather, paternal grandmother, and sister; Heart disease in her maternal grandfather and mother; Hypertension in her maternal grandfather, maternal grandmother, mother, paternal grandfather, and paternal grandmother; Myasthenia gravis in her sister; Stroke in her maternal grandmother.  ROS:  Please see the history of  present illness. No headaches, visual changes, focal weakness reported. All other systems are reviewed and otherwise negative.   PHYSICAL EXAM:  VS:  BP 140/80   Pulse 70   Ht 5' 4.5" (1.638 m)   Wt 172 lb 12.8 oz (78.4 kg)   SpO2 98%   BMI 29.20 kg/m  BMI: Body mass index is 29.2 kg/m. Well nourished, well developed WF, in no acute distress  HEENT: normocephalic, atraumatic  Neck: no JVD, carotid bruits or masses Cardiac:  normal S1, S2; RRR; no murmurs, rubs, or gallops Lungs:  clear to auscultation bilaterally, no wheezing, rhonchi or rales  Abd: soft, nontender, no hepatomegaly, + BS MS: no deformity or atrophy Ext: no edema, right radial cath site completely healed, no ecchymosis or hematoma; good pulse Skin: warm and dry, no rash Neuro:  moves all extremities spontaneously, no focal abnormalities noted, follows commands Psych: euthymic mood, full affect  EKG:   NSR 65bpm, low voltage QRS, no acute changes  Recent Labs: 07/05/2016:  ALT 12; BUN 22; Creatinine, Ser 1.01; Hemoglobin 12.6; Platelets 404.0; Potassium 4.2; Sodium 140; TSH 2.98  07/05/2016: Cholesterol 192; HDL 38.60; LDL Cholesterol 135; Total CHOL/HDL Ratio 5; Triglycerides 92.0; VLDL 18.4   CrCl cannot be calculated (Patient's most recent lab result is older than the maximum 21 days allowed.).   Wt Readings from Last 3 Encounters:  08/16/16 172 lb 12.8 oz (78.4 kg)  07/25/16 172 lb 12.8 oz (78.4 kg)  07/20/16 173 lb 8 oz (78.7 kg)     Other studies reviewed: Additional studies/records reviewed today include: summarized above  ASSESSMENT AND PLAN:   CAD s/p PCI as above -    Cath September 2017 no restenosis had had SSCP relieved  With nitro f/u lexiscan myovue   Dizziness - improved with cutting back BP meds   Essential HTN - Well controlled.  Continue current medications and low sodium Dash type diet.   HLD - has not started repatha yet will pick up from specialty pharmacy and then get first injection with  [name] in office  Lab Results  Component Value Date   LDLCALC 135 (H) 07/05/2016      Paroxysmal SVT with history of sinus pause on event monitoring - see above.  Thyroid nodule: biopsy negative f/u primary   Disposition: f/u with me in 6 months    Current medicines are reviewed at length with the patient today.  The patient did not have any concerns regarding medicines.  Charlton Haws

## 2016-08-16 ENCOUNTER — Ambulatory Visit (INDEPENDENT_AMBULATORY_CARE_PROVIDER_SITE_OTHER): Payer: Medicare Other | Admitting: Cardiovascular Disease

## 2016-08-16 ENCOUNTER — Encounter: Payer: Self-pay | Admitting: Cardiovascular Disease

## 2016-08-16 ENCOUNTER — Encounter (INDEPENDENT_AMBULATORY_CARE_PROVIDER_SITE_OTHER): Payer: Self-pay

## 2016-08-16 VITALS — BP 140/80 | HR 70 | Ht 64.5 in | Wt 172.8 lb

## 2016-08-16 DIAGNOSIS — I25110 Atherosclerotic heart disease of native coronary artery with unstable angina pectoris: Secondary | ICD-10-CM

## 2016-08-16 DIAGNOSIS — I10 Essential (primary) hypertension: Secondary | ICD-10-CM

## 2016-08-16 NOTE — Patient Instructions (Signed)
Medication Instructions:  Your physician recommends that you continue on your current medications as directed. Please refer to the Current Medication list given to you today.  Labwork: NONE  Testing/Procedures Your physician has requested that you have a lexiscan myoview. For further information please visit [email or web address]. Please follow instruction sheet, as given.  Follow-Up: Your physician wants you to follow-up in: 6 months with Dr. [name]. You will receive a reminder letter in the mail two months in advance. If you don't receive a letter, please call our office to schedule the follow-up appointment.   If you need a refill on your cardiac medications before your next appointment, please call your pharmacy.    

## 2016-08-17 ENCOUNTER — Telehealth (HOSPITAL_COMMUNITY): Payer: Self-pay | Admitting: *Deleted

## 2016-08-17 NOTE — Telephone Encounter (Signed)
Patient given detailed instructions per Myocardial Perfusion Study Information Sheet for the test on  08/22/16. Patient notified to arrive 15 minutes early and that it is imperative to arrive on time for appointment to keep from having the test rescheduled.  If you need to cancel or reschedule your appointment, please call the office within 24 hours of your appointment. Failure to do so may result in a cancellation of your appointment, and a $50 no show fee. Patient verbalized understanding. Maria [name] [name]    

## 2016-08-18 ENCOUNTER — Encounter: Payer: Self-pay | Admitting: Family Medicine

## 2016-08-22 ENCOUNTER — Ambulatory Visit (HOSPITAL_COMMUNITY): Payer: Medicare Other | Attending: Internal Medicine

## 2016-08-22 DIAGNOSIS — E119 Type 2 diabetes mellitus without complications: Secondary | ICD-10-CM | POA: Diagnosis not present

## 2016-08-22 DIAGNOSIS — I10 Essential (primary) hypertension: Secondary | ICD-10-CM | POA: Diagnosis not present

## 2016-08-22 DIAGNOSIS — I4510 Unspecified right bundle-branch block: Secondary | ICD-10-CM | POA: Diagnosis not present

## 2016-08-22 DIAGNOSIS — I25110 Atherosclerotic heart disease of native coronary artery with unstable angina pectoris: Secondary | ICD-10-CM | POA: Insufficient documentation

## 2016-08-22 LAB — MYOCARDIAL PERFUSION IMAGING
CHL CUP NUCLEAR SSS: 0
LV sys vol: 18 mL
LVDIAVOL: 65 mL (ref 46–106)
Peak HR: 88 {beats}/min
RATE: 0.3
Rest HR: 65 {beats}/min
SDS: 0
SRS: 0
TID: 1.16

## 2016-08-22 MED ORDER — TECHNETIUM TC 99M TETROFOSMIN IV KIT
32.7000 | PACK | Freq: Once | INTRAVENOUS | Status: AC | PRN
  Administered 2016-08-22: 32.7 via INTRAVENOUS
  Filled 2016-08-22: qty 33

## 2016-08-22 MED ORDER — REGADENOSON 0.4 MG/5ML IV SOLN
0.4000 mg | Freq: Once | INTRAVENOUS | Status: AC
  Administered 2016-08-22: 0.4 mg via INTRAVENOUS

## 2016-08-22 MED ORDER — TECHNETIUM TC 99M TETROFOSMIN IV KIT
10.3000 | PACK | Freq: Once | INTRAVENOUS | Status: AC | PRN
  Administered 2016-08-22: 10.3 via INTRAVENOUS
  Filled 2016-08-22: qty 11

## 2016-08-28 ENCOUNTER — Ambulatory Visit: Payer: Medicare Other | Admitting: Cardiovascular Disease

## 2016-08-30 NOTE — Telephone Encounter (Signed)
Spoke with pt and she states she received her first shipment of Repatha last week but that she went out of town the next day. She would like to come into clinic next week on 4/24 so that we can help her with her first injection. Will schedule f/u lab work at that time.

## 2016-09-05 ENCOUNTER — Telehealth: Payer: Self-pay | Admitting: Pharmacist

## 2016-09-05 DIAGNOSIS — E785 Hyperlipidemia, unspecified: Secondary | ICD-10-CM

## 2016-09-05 NOTE — Telephone Encounter (Signed)
Pt in office to give first Repatha injection. She demonstrated appropriate injection technique. Follow up labs ordered.

## 2016-09-06 ENCOUNTER — Other Ambulatory Visit: Payer: Self-pay | Admitting: Cardiovascular Disease

## 2016-09-08 ENCOUNTER — Ambulatory Visit: Payer: Medicare Other | Admitting: Cardiovascular Disease

## 2016-09-11 ENCOUNTER — Ambulatory Visit: Payer: Medicare Other | Admitting: Cardiovascular Disease

## 2016-09-12 ENCOUNTER — Other Ambulatory Visit: Payer: Self-pay | Admitting: Family Medicine

## 2016-10-17 ENCOUNTER — Ambulatory Visit (INDEPENDENT_AMBULATORY_CARE_PROVIDER_SITE_OTHER): Payer: Medicare Other | Admitting: Primary Care

## 2016-10-17 ENCOUNTER — Encounter: Payer: Self-pay | Admitting: Primary Care

## 2016-10-17 VITALS — BP 128/74 | HR 65 | Temp 97.5°F | Ht 64.5 in | Wt 174.0 lb

## 2016-10-17 DIAGNOSIS — J069 Acute upper respiratory infection, unspecified: Secondary | ICD-10-CM | POA: Diagnosis not present

## 2016-10-17 MED ORDER — HYDROCODONE-HOMATROPINE 5-1.5 MG/5ML PO SYRP: 5.0000 mL | ORAL_SOLUTION | Freq: Every evening | ORAL | 0 refills | Status: DC | PRN

## 2016-10-17 MED ORDER — AMOXICILLIN 875 MG PO TABS: 875.0000 mg | ORAL_TABLET | Freq: Two times a day (BID) | ORAL | 0 refills | Status: DC

## 2016-10-17 NOTE — Patient Instructions (Addendum)
Start amoxicillin antibiotics. Take 1 tablet by mouth twice daily for 7 days.  You may take the Hycodan cough suppressant at bedtime as needed for cough and rest. Caution this medication contains codeine and will make you feel drowsy.  Cough/Congestion: Try taking Mucinex DM. This will help loosen up the mucous in your chest. Ensure you take this medication with a full glass of water.  Ensure you are staying hydrated with water and rest.  It was a pleasure meeting you!

## 2016-10-17 NOTE — Progress Notes (Signed)
Subjective:    Patient ID: Maryln ManuelBarbara D Yang, female    DOB: 1940-12-06, 76 y.o.   MRN: [id number]  HPI  Ms. Dot BeenDark is a 76 year old female with a history of GERD and essential hypertension (managed on ARB) who presents today with a chief complaint of cough. She also reports chest congestion, nasal congestion. Her cough is productive with yellowish sputum. Her symptoms began 8 days ago. She's taken Nyquil OTC without much improvement. Overall she's feeling about the same.  Review of Systems  Constitutional: Positive for fatigue. Negative for chills and fever.  HENT: Positive for congestion. Negative for ear pain, sinus pressure and sore throat.   Respiratory: Positive for cough. Negative for shortness of breath and wheezing.   Cardiovascular: Negative for chest pain.       Past Medical History:  Diagnosis Date  . Anemia 05/1985  . Carotid artery disease (HCC)    a. 02/2015 - 1-39% bilaterally.  . Chronic back pain   . Coronary artery disease    a. [facility].  . DDD (degenerative disc disease), cervical   . DDD (degenerative disc disease), lumbar   . Fracture of lower leg 07/2001   Right  . GERD (gastroesophageal reflux disease)   . Hyperlipidemia   . Hypertension   . Multinodular thyroid   . Pneumonia ?1990's X 1  . PSVT (paroxysmal supraventricular tachycardia) (HCC)    a. s/p catheter ablation of a parahisian atrial tachycardia which was successfully ablated from the non-coronary cusp of the aortic root 06/2014.  [name]. Renal cyst 07/10/2006   bilateral  . Right knee pain 02/2010   Injected - Dr. Shelle IronBeane  . Sinus pause    a. By event monitoring 09/2014 - BB stopped at that time.  . Syncope and collapse 07/2001   "that's when I broke my leg"  . Type II diabetes mellitus (HCC) 2002     Social History   Social History  . Marital status: Married    Spouse name: N/A  . Number of children: 2  . Years of education: N/A   Occupational History  . 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 Smoking status: Never Smoker  . Smokeless tobacco: Never Used  . Alcohol use No  . Drug use: No  . Sexual activity: Yes   Other Topics Concern  . Not on file   Social History Narrative   Widowed 2011 after 52 years of marriage (husband had C.diff)   Remarried 2015    Past Surgical History:  Procedure Laterality Date  . ANTERIOR CERVICAL DECOMP/DISCECTOMY FUSION  10/03/2005   C4/5; C5/6  "it's got a plate in there"  . BACK SURGERY    . BREAST BIOPSY Bilateral 1968 (multiple)   "all benign"  . BREAST LUMPECTOMY Bilateral 1968  . CARDIAC CATHETERIZATION N/A 11/18/2014   Procedure: Left Heart Cath and Coronary Angiography;  Surgeon: Peter M SwazilandJordan, MD;  Location: [facility] INVASIVE CV LAB;  Service: Cardiovascular;  Laterality: N/A;  . CARDIAC CATHETERIZATION N/A 01/22/2015   Procedure: Coronary Stent Intervention;  Surgeon: Peter M SwazilandJordan, MD;  [facility];  Service: Cardiovascular;  Laterality: N/A;  . CARDIAC CATHETERIZATION N/A 06/10/2015   Procedure: Left Heart Cath and Coronary Angiography;  Surgeon: Peter M SwazilandJordan, MD;  [facility];  Service: Cardiovascular;  Laterality: N/A;  . CATARACT EXTRACTION  W/ INTRAOCULAR LENS  IMPLANT, BILATERAL Bilateral 12/2010  . CORONARY ANGIOPLASTY    . ESOPHAGOGASTRODUODENOSCOPY  08/14/2005   gastropathy biopsy, negative  . LAPAROSCOPIC CHOLECYSTECTOMY  08/1992  . POSTERIOR LAMINECTOMY / DECOMPRESSION LUMBAR SPINE  09/2001   due to herniated disc  . SUPRAVENTRICULAR TACHYCARDIA ABLATION N/A 07/06/2014   Procedure: SUPRAVENTRICULAR TACHYCARDIA ABLATION;  Surgeon: Marinus Maw, MD;  Location: [facility] CATH LAB;  Service: Cardiovascular;  Laterality: N/A;  . VAGINAL HYSTERECTOMY  1968   spotting    Family History  Problem Relation Age of Onset  . Hypertension Mother   . Heart disease Mother   . Diabetes Sister   .  Myasthenia gravis Sister   . Diabetes Brother        1/2 juvenile; foot ulcer; periph. neuropathy  . Heart disease Maternal Grandfather        MI  . Hypertension Maternal Grandfather   . Depression Paternal Aunt   . Hypertension Maternal Grandmother   . Diabetes Maternal Grandmother   . Stroke Maternal Grandmother   . Hypertension Paternal Grandmother   . Diabetes Paternal Grandmother   . Hypertension Paternal Grandfather   . Diabetes Paternal Grandfather   . Breast cancer Maternal Aunt   . Colon cancer Neg Hx     Allergies  Allergen Reactions  . Acetaminophen     REACTION: jittery with plain tylenol, but tolerated tylenol/benadryl combination  . Carvedilol     REACTION: nightmares  . Crestor [Rosuvastatin]     aches  . Ezetimibe-Simvastatin     REACTION: muscle aches  . Ibuprofen     REACTION: GI upset  . Insulin Glargine     Rash  . Levemir [Insulin Detemir]     rash  . Macrobid [Nitrofurantoin Macrocrystal] Other (See Comments)    Achey, N & V  . Metformin     REACTION: GI upset  . Metoprolol Tartrate     REACTION: nightmares  . Naproxen     REACTION: GI upset  . Pravastatin Other (See Comments)    Myalgias    Current Outpatient Prescriptions on File Prior to Visit  Medication Sig Dispense Refill  . aspirin EC 81 MG tablet Take 81 mg by mouth 2 (two) times daily.    [name] atenolol (TENORMIN) 25 MG tablet TAKE 1/2 TABLET (12.5 MG TOTAL) BY MOUTH DAILY. 45 tablet 2  . celecoxib (CELEBREX) 200 MG capsule Take 1 capsule (200 mg total) by mouth daily as needed. 30 capsule 5  . glyBURIDE (DIABETA) 5 MG tablet TAKE 1 TABLET BY MOUTH EVERY MORNING AND 1/2 TO 1 TABLET EVERY EVENING. 180 tablet 1  . ibuprofen (ADVIL,MOTRIN) 200 MG tablet Take 400 mg by mouth every 8 (eight) hours as needed for mild pain or moderate pain.    . isosorbide mononitrate (IMDUR) 30 MG 24 hr tablet Take 30 mg by mouth daily.   2  . losartan (COZAAR) 25 MG tablet TAKE 1 TABLET BY MOUTH DAILY. 90  tablet 3  . nitroGLYCERIN (NITROSTAT) 0.4 MG SL tablet Place 0.4 mg under the tongue every 5 (five) minutes as needed for chest pain (no more than 3 doses).    [name] omeprazole (PRILOSEC) 40 MG capsule Take 1 capsule by mouth daily. Heartburn or acid reflux    . traMADol (ULTRAM) 50 MG tablet Take 1 tablet (50 mg total) by mouth every 12 (twelve) hours as needed (sedation caution). 60 tablet 3  . Evolocumab (REPATHA SURECLICK) 140 MG/ML SOAJ Inject 1 pen into  the skin every 14 (fourteen) days. (Patient not taking: Reported on 08/16/2016) 2 pen 11   No current facility-administered medications on file prior to visit.     BP 128/74   Pulse 65   Temp 97.5 F (36.4 C) (Oral)   Ht 5' 4.5" (1.638 m)   Wt 174 lb (78.9 kg)   SpO2 97%   BMI 29.41 kg/m    Objective:   Physical Exam  Constitutional: She appears well-nourished. She appears ill.  HENT:  Right Ear: Tympanic membrane and ear canal normal.  Left Ear: Tympanic membrane and ear canal normal.  Nose: Mucosal edema present. Right sinus exhibits no maxillary sinus tenderness and no frontal sinus tenderness. Left sinus exhibits no maxillary sinus tenderness and no frontal sinus tenderness.  Mouth/Throat: Oropharynx is clear and moist.  Eyes: Conjunctivae are normal.  Neck: Neck supple.  Cardiovascular: Normal rate and regular rhythm.   Pulmonary/Chest: Effort normal. She has no decreased breath sounds. She has no wheezes. She has rhonchi in the right lower field, the left upper field and the left lower field. She has no rales.  Lymphadenopathy:    She has no cervical adenopathy.  Skin: Skin is warm and dry.          Assessment & Plan:  URI:  Cough, congestion, fatigue x 8 days, overall no improvement. Exam today with suspicious lung sounds as noted. Given duration of symptoms, examination, and presentation, will treat. Rx for Amoxil course sent to pharmacy. Rx for Hycodan printed, drowsiness precautions provided. Discussed use of  Mucinex. Fluids, rest, follow up PRN.  Morrie Sheldon, NP

## 2016-10-26 ENCOUNTER — Other Ambulatory Visit: Payer: Medicare Other | Admitting: *Deleted

## 2016-10-26 DIAGNOSIS — E785 Hyperlipidemia, unspecified: Secondary | ICD-10-CM

## 2016-10-26 DIAGNOSIS — I10 Essential (primary) hypertension: Secondary | ICD-10-CM

## 2016-10-26 LAB — LIPID PANEL
CHOLESTEROL TOTAL: 106 mg/dL (ref 100–199)
Chol/HDL Ratio: 2.4 ratio (ref 0.0–4.4)
HDL: 45 mg/dL (ref >39)
LDL Calculated: 43 mg/dL (ref 0–99)
TRIGLYCERIDES: 88 mg/dL (ref 0–149)
VLDL Cholesterol Cal: 18 mg/dL (ref 5–40)

## 2016-10-26 LAB — HEPATIC FUNCTION PANEL
ALK PHOS: 92 IU/L (ref 39–117)
ALT: 15 IU/L (ref 0–32)
AST: 17 IU/L (ref 0–40)
Albumin: 4.1 g/dL (ref 3.5–4.8)
Bilirubin Total: 0.5 mg/dL (ref 0.0–1.2)
Bilirubin, Direct: 0.17 mg/dL (ref 0.00–0.40)
Total Protein: 6.1 g/dL (ref 6.0–8.5)

## 2016-11-01 ENCOUNTER — Emergency Department (HOSPITAL_COMMUNITY)
Admission: EM | Admit: 2016-11-01 | Discharge: 2016-11-01 | Disposition: A | Discharge status: Home / Self Care | Payer: Medicare Other | Attending: Emergency Medicine | Admitting: Emergency Medicine

## 2016-11-01 ENCOUNTER — Encounter (HOSPITAL_COMMUNITY): Payer: Self-pay | Admitting: Emergency Medicine

## 2016-11-01 DIAGNOSIS — Z7984 Long term (current) use of oral hypoglycemic drugs: Secondary | ICD-10-CM | POA: Insufficient documentation

## 2016-11-01 DIAGNOSIS — R55 Syncope and collapse: Secondary | ICD-10-CM | POA: Diagnosis not present

## 2016-11-01 DIAGNOSIS — Z79899 Other long term (current) drug therapy: Secondary | ICD-10-CM | POA: Diagnosis not present

## 2016-11-01 DIAGNOSIS — I2510 Atherosclerotic heart disease of native coronary artery without angina pectoris: Secondary | ICD-10-CM | POA: Insufficient documentation

## 2016-11-01 DIAGNOSIS — E860 Dehydration: Secondary | ICD-10-CM | POA: Diagnosis not present

## 2016-11-01 DIAGNOSIS — Z7982 Long term (current) use of aspirin: Secondary | ICD-10-CM | POA: Insufficient documentation

## 2016-11-01 DIAGNOSIS — I10 Essential (primary) hypertension: Secondary | ICD-10-CM | POA: Insufficient documentation

## 2016-11-01 DIAGNOSIS — E119 Type 2 diabetes mellitus without complications: Secondary | ICD-10-CM | POA: Insufficient documentation

## 2016-11-01 LAB — CBC
HCT: 37.9 % (ref 36.0–46.0)
Hemoglobin: 12.4 g/dL (ref 12.0–15.0)
MCH: 29.1 pg (ref 26.0–34.0)
MCHC: 32.7 g/dL (ref 30.0–36.0)
MCV: 89 fL (ref 78.0–100.0)
PLATELETS: 348 10*3/uL (ref 150–400)
RBC: 4.26 MIL/uL (ref 3.87–5.11)
RDW: 13.4 % (ref 11.5–15.5)
WBC: 10.6 10*3/uL — ABNORMAL HIGH (ref 4.0–10.5)

## 2016-11-01 LAB — CBG MONITORING, ED: GLUCOSE-CAPILLARY: 193 mg/dL — AB (ref 65–99)

## 2016-11-01 LAB — BASIC METABOLIC PANEL
Anion gap: 9 (ref 5–15)
BUN: 22 mg/dL — ABNORMAL HIGH (ref 6–20)
CALCIUM: 9.3 mg/dL (ref 8.9–10.3)
CO2: 23 mmol/L (ref 22–32)
Chloride: 108 mmol/L (ref 101–111)
Creatinine, Ser: 1.26 mg/dL — ABNORMAL HIGH (ref 0.44–1.00)
GFR calc non Af Amer: 40 mL/min — ABNORMAL LOW (ref >60)
GFR, EST AFRICAN AMERICAN: 46 mL/min — AB (ref >60)
GLUCOSE: 192 mg/dL — AB (ref 65–99)
Potassium: 4.3 mmol/L (ref 3.5–5.1)
Sodium: 140 mmol/L (ref 135–145)

## 2016-11-01 LAB — I-STAT TROPONIN, ED: Troponin i, poc: 0 ng/mL (ref 0.00–0.08)

## 2016-11-01 MED ORDER — SODIUM CHLORIDE 0.9 % IV BOLUS (SEPSIS): 1000.0000 mL | Freq: Once | INTRAVENOUS | Status: DC

## 2016-11-01 MED ORDER — SODIUM CHLORIDE 0.9 % IV BOLUS (SEPSIS)
500.0000 mL | Freq: Once | INTRAVENOUS | Status: AC
Start: 2016-11-01 — End: 2016-11-01
  Administered 2016-11-01: 500 mL via INTRAVENOUS

## 2016-11-01 NOTE — ED Triage Notes (Addendum)
Per EMS:  Pt was leaving a medical appointment (Pt had cortisone shots earlier) and walked downstairs to car, felt light headed, dizzy and lost her balance.  Husband caught pt as she was falling. Afterwards pt walked back upstairs to her physicians office and at that time the office called EMS.  Pt denies any N/V or pain

## 2016-11-01 NOTE — ED Notes (Signed)
Pt became lightheaded and endorsed dizziness, while seeming slightly unsteady without assistance for 15 seconds during the standing portion of orthostatic vital signs.

## 2016-11-01 NOTE — ED Provider Notes (Signed)
[facility]-EMERGENCY DEPT Provider Note   CSN: [id number] Arrival date & time: 11/01/16  1631     History   Chief Complaint Chief Complaint  Patient presents with  . Near Syncope    HPI Maria [name] is a 76 y.o. female.  The history is provided by the patient.  Near Syncope  This is a recurrent problem. The current episode started 1 to 2 hours ago. Episode frequency: once. The problem has been resolved. Pertinent negatives include no chest pain, no abdominal pain, no headaches and no shortness of breath. Associated symptoms comments: Palpitations but no tachycardia. The symptoms are aggravated by standing. Nothing relieves the symptoms. She has tried nothing for the symptoms.   This occurred shortly following in bilateral shoulder steroid injections. cbg 242 by Pt.  Reports having N/V/D last night after eating salad dressing that always gives her these symptoms. No other infectious symptoms.   Patient also reports that she's been working on the garden as often as she can initiate and possibly not hydrating as well as she should.   Past Medical History:  Diagnosis Date  . Anemia 05/1985  . Carotid artery disease (HCC)    a. 02/2015 - 1-39% bilaterally.  . Chronic back pain   . Coronary artery disease    a. s/p DES to [facility] 01/2015.  . DDD (degenerative disc disease), cervical   . DDD (degenerative disc disease), lumbar   . Fracture of lower leg 07/2001   Right  . GERD (gastroesophageal reflux disease)   . Hyperlipidemia   . Hypertension   . Multinodular thyroid   . Pneumonia ?1990's X 1  . PSVT (paroxysmal supraventricular tachycardia) (HCC)    a. s/p catheter ablation of a parahisian atrial tachycardia which was successfully ablated from the non-coronary cusp of the aortic root 06/2014.  [name] Renal cyst 07/10/2006   bilateral  . Right knee pain 02/2010   Injected - Dr. Shelle Iron  . Sinus pause    a. By event monitoring 09/2014 - BB stopped at that time.  . Syncope and collapse  07/2001   "that's when I broke my leg"  . Type II diabetes mellitus (HCC) 2002    Patient Active Problem List   Diagnosis Date Noted  . Bruising 09/19/2015  . Dysuria 06/15/2015  . Carotid artery disease (HCC)   . Concussion with no loss of consciousness 02/11/2015  . Coronary atherosclerosis of native coronary artery 01/22/2015  . Angina pectoris (HCC) 01/22/2015  . Chest pain 11/18/2014  . Sinus node dysfunction (HCC) 11/02/2014  . SVT (supraventricular tachycardia) (HCC) 07/06/2014  . Benign paroxysmal positional vertigo 05/21/2014  . Weight loss 06/09/2012  . Functional diarrhea 02/01/2012  . Type 2 diabetes mellitus with vascular disease (HCC) 02/13/2011  . Shoulder pain, left 09/12/2010  . ADJUSTMENT REACTION, ADULT 05/17/2010  . KNEE PAIN, RIGHT 10/01/2009  . SYNCOPE 02/22/2009  . PSVT 11/12/2008  . GERD 11/12/2008  . RENAL CYST 11/12/2008  . Backache 11/12/2008  . GOITER, MULTINODULAR 08/07/2007  . Reflux esophagitis 08/07/2007  . Elevated lipids 08/21/2006  . ANEMIA-NOS 08/21/2006  . DEPRESSION 08/21/2006  . Essential hypertension 08/21/2006  . DEGENERATIVE DISC DISEASE, CERVICAL SPINE 08/21/2006  . DEGENERATIVE DISC DISEASE, LUMBAR SPINE 08/21/2006    Past Surgical History:  Procedure Laterality Date  . ANTERIOR CERVICAL DECOMP/DISCECTOMY FUSION  10/03/2005   C4/5; C5/6  "it's got a plate in there"  . BACK SURGERY    . BREAST BIOPSY Bilateral 1968 (multiple)   "all benign"  .  BREAST LUMPECTOMY Bilateral 1968  . CARDIAC CATHETERIZATION N/A 11/18/2014   Procedure: Left Heart Cath and Coronary Angiography;  Surgeon: Peter M Swaziland, MD;  Location[facility] INVASIVE CV LAB;  Service: Cardiovascular;  Laterality: N/A;  . CARDIAC CATHETERIZATION N/A 01/22/2015   Procedure: Coronary Stent Intervention;  Surgeon: Peter M Swaziland, MD;  Location: [facility] INVASIVE CV LAB;  Service: Cardiovascular;  Laterality: N/A;  . CARDIAC CATHETERIZATION N/A 06/10/2015   Procedure: Left Heart Cath  and Coronary Angiography;  Surgeon: Peter M Swaziland, MD;  Location: [facility] INVASIVE CV LAB;  Service: Cardiovascular;  Laterality: N/A;  . CATARACT EXTRACTION W/ INTRAOCULAR LENS  IMPLANT, BILATERAL Bilateral 12/2010  . CORONARY ANGIOPLASTY    . ESOPHAGOGASTRODUODENOSCOPY  08/14/2005   gastropathy biopsy, negative  . LAPAROSCOPIC CHOLECYSTECTOMY  08/1992  . POSTERIOR LAMINECTOMY / DECOMPRESSION LUMBAR SPINE  09/2001   due to herniated disc  . SUPRAVENTRICULAR TACHYCARDIA ABLATION N/A 07/06/2014   Procedure: SUPRAVENTRICULAR TACHYCARDIA ABLATION;  Surgeon: Marinus Maw, MD;  Location: [facility] CATH LAB;  Service: Cardiovascular;  Laterality: N/A;  . VAGINAL HYSTERECTOMY  1968   spotting    OB History    No data available       Home Medications    Prior to Admission medications   Medication Sig Start Date End Date Taking? Authorizing Provider  aspirin EC 81 MG tablet Take 81 mg by mouth once.    Yes [provider]  atenolol (TENORMIN) 25 MG tablet TAKE 1/2 TABLET (12.5 MG TOTAL) BY MOUTH DAILY. 05/02/16  Yes Dunn, Dayna N, PA-C  celecoxib (CELEBREX) 200 MG capsule Take 1 capsule (200 mg total) by mouth daily as needed. 01/04/16  Yes Joaquim Nam, MD  Evolocumab (REPATHA SURECLICK) 140 MG/ML SOAJ Inject 1 pen into the skin every 14 (fourteen) days. 07/25/16  Yes Wendall Stade, MD  glyBURIDE (DIABETA) 5 MG tablet TAKE 1 TABLET BY MOUTH EVERY MORNING AND 1/2 TO 1 TABLET EVERY EVENING. Patient taking differently: TAKE 1 TABLET BY MOUTH EVERY MORNING AND 1/2 TO 1 TABLET EVERY EVENING if needed 09/12/16  Yes Joaquim Nam, MD  isosorbide mononitrate (IMDUR) 30 MG 24 hr tablet Take 30 mg by mouth daily.  05/02/16  Yes [provider]  losartan (COZAAR) 25 MG tablet TAKE 1 TABLET BY MOUTH DAILY. Patient taking differently: TAKE 1 TABLET BY MOUTH AT NIGHT 09/06/16  Yes Wendall Stade, MD  traMADol (ULTRAM) 50 MG tablet Take 1 tablet (50 mg total) by mouth every 12 (twelve) hours as  needed (sedation caution). Patient taking differently: Take 50 mg by mouth every 6 (six) hours as needed for moderate pain (sedation caution).  03/29/16  Yes Joaquim Nam, MD  amoxicillin (AMOXIL) 875 MG tablet Take 1 tablet (875 mg total) by mouth 2 (two) times daily. 10/17/16   Doreene Nest, NP  HYDROcodone-homatropine [facility]) 5-1.5 MG/5ML syrup Take 5 mLs by mouth at bedtime as needed. Patient not taking: Reported on 11/01/2016 10/17/16   Doreene Nest, NP  ibuprofen (ADVIL,MOTRIN) 200 MG tablet Take 400 mg by mouth every 8 (eight) hours as needed for mild pain or moderate pain.    [provider]  nitroGLYCERIN (NITROSTAT) 0.4 MG SL tablet Place 0.4 mg under the tongue every 5 (five) minutes as needed for chest pain (no more than 3 doses).    [provider]  omeprazole (PRILOSEC) 40 MG capsule Take 1 capsule by mouth daily. Heartburn or acid reflux 04/22/14   [provider]  Family History Family History  Problem Relation Age of Onset  . Hypertension Mother   . Heart disease Mother   . Diabetes Sister   . Myasthenia gravis Sister   . Diabetes Brother        1/2 juvenile; foot ulcer; periph. neuropathy  . Heart disease Maternal Grandfather        MI  . Hypertension Maternal Grandfather   . Depression Paternal Aunt   . Hypertension Maternal Grandmother   . Diabetes Maternal Grandmother   . Stroke Maternal Grandmother   . Hypertension Paternal Grandmother   . Diabetes Paternal Grandmother   . Hypertension Paternal Grandfather   . Diabetes Paternal Grandfather   . Breast cancer Maternal Aunt   . Colon cancer Neg Hx     Social History Social History  Substance Use Topics  . Smoking status: Never Smoker  . Smokeless tobacco: Never Used  . Alcohol use No     Allergies   Acetaminophen; Carvedilol; Crestor [rosuvastatin]; Ezetimibe-simvastatin; Ibuprofen; Insulin glargine; Levemir [insulin detemir]; Macrobid [nitrofurantoin  macrocrystal]; Metformin; Metoprolol tartrate; Naproxen; and Pravastatin   Review of Systems Review of Systems  Respiratory: Negative for shortness of breath.   Cardiovascular: Positive for near-syncope. Negative for chest pain.  Gastrointestinal: Negative for abdominal pain.  Neurological: Negative for headaches.   All other systems are reviewed and are negative for acute change except as noted in the HPI   Physical Exam Updated Vital Signs BP (!) 148/63   Pulse 67   Resp 13   Ht 5\' 5"  (1.651 m)   Wt 79.4 kg (175 lb)   SpO2 97%   BMI 29.12 kg/m   Physical Exam  Constitutional: She is oriented to person, place, and time. She appears well-developed and well-nourished. No distress.  HENT:  Head: Normocephalic and atraumatic.  Nose: Nose normal.  Eyes: Conjunctivae and EOM are normal. Pupils are equal, round, and reactive to light. Right eye exhibits no discharge. Left eye exhibits no discharge. No scleral icterus.  Neck: Normal range of motion. Neck supple.  Cardiovascular: Normal rate and regular rhythm.  Exam reveals no gallop and no friction rub.   No murmur heard. Pulmonary/Chest: Effort normal and breath sounds normal. No stridor. No respiratory distress. She has no rales.  Abdominal: Soft. She exhibits no distension. There is no tenderness.  Musculoskeletal: She exhibits no edema or tenderness.  Neurological: She is alert and oriented to person, place, and time.  Skin: Skin is warm and dry. No rash noted. She is not diaphoretic. No erythema.  Psychiatric: She has a normal mood and affect.  Vitals reviewed.    ED Treatments / Results  Labs (all labs ordered are listed, but only abnormal results are displayed) Labs Reviewed  BASIC METABOLIC PANEL - Abnormal; Notable for the following:       Result Value   Glucose, Bld 192 (*)    BUN 22 (*)    Creatinine, Ser 1.26 (*)    GFR calc non Af Amer 40 (*)    GFR calc Af Amer 46 (*)    All other components within normal  limits  CBC - Abnormal; Notable for the following:    WBC 10.6 (*)    All other components within normal limits  CBG MONITORING, ED - Abnormal; Notable for the following:    Glucose-Capillary 193 (*)    All other components within normal limits  I-STAT TROPOININ, ED    EKG  EKG Interpretation  Date/Time:  Wednesday November 01 2016 16:38:48 EDT Ventricular Rate:  68 PR Interval:    QRS Duration: 141 QT Interval:  437 QTC Calculation: 465 R Axis:   -38 Text Interpretation:  Sinus rhythm Right bundle branch block No significant change since last tracing Confirmed by Drema Pryardama, Pedro [phone]) on 11/01/2016 5:04:01 PM       Radiology No results found.  Procedures Procedures (including critical care time)  Medications Ordered in ED Medications  sodium chloride 0.9 % bolus 500 mL (0 mLs Intravenous Stopped 11/01/16 1920)     Initial Impression / Assessment and Plan / ED Course  I have reviewed the triage vital signs and the nursing notes.  Pertinent labs & imaging results that were available during my care of the patient were reviewed by me and considered in my medical decision making (see chart for details).     Orthostatic vitals were within normal limits however patient was significantly symptomatic when going from sitting to standing position. EKG nonischemic without evidence of significant dysrhythmias or blocks. Labs reveal evidence of dehydration. Patient was given IV fluid hydration as well as oral hydration.   Following hydration patient was completely asymptomatic when standing and ambulating.  Her presentation most likely secondary to dehydration with superimposed vasovagal from procedure. I have low suspicion for cardiac etiology.  Patient was instructed to follow up closely with primary care provider within 5 days to reassess her kidney function.  Final Clinical Impressions(s) / ED Diagnoses   Final diagnoses:  Near syncope  Dehydration   Disposition:  Discharge  Condition: Good  I have discussed the results, Dx and Tx plan with the patient who expressed understanding and agree(s) with the plan. Discharge instructions discussed at great length. The patient was given strict return precautions who verbalized understanding of the instructions. No further questions at time of discharge.    New Prescriptions   No medications on file    Follow Up: Joaquim Namuncan, Graham S, MD [address] [place] [id number] [phone]   in 3-5 days to reassess kidney function      Cardama, Amadeo GarnetPedro Eduardo, MD 11/01/16 [phone]

## 2016-11-10 ENCOUNTER — Ambulatory Visit (INDEPENDENT_AMBULATORY_CARE_PROVIDER_SITE_OTHER): Payer: Medicare Other | Admitting: Family Medicine

## 2016-11-10 ENCOUNTER — Encounter: Payer: Self-pay | Admitting: Family Medicine

## 2016-11-10 VITALS — BP 110/58 | HR 63 | Temp 97.5°F | Wt 172.0 lb

## 2016-11-10 DIAGNOSIS — E1159 Type 2 diabetes mellitus with other circulatory complications: Secondary | ICD-10-CM | POA: Diagnosis not present

## 2016-11-10 DIAGNOSIS — E119 Type 2 diabetes mellitus without complications: Secondary | ICD-10-CM

## 2016-11-10 LAB — BASIC METABOLIC PANEL
BUN: 37 mg/dL — AB (ref 6–23)
CHLORIDE: 104 meq/L (ref 96–112)
CO2: 26 meq/L (ref 19–32)
CREATININE: 1.21 mg/dL — AB (ref 0.40–1.20)
Calcium: 9.4 mg/dL (ref 8.4–10.5)
GFR: 45.83 mL/min — ABNORMAL LOW (ref >60.00)
Glucose, Bld: 349 mg/dL — ABNORMAL HIGH (ref 70–99)
Potassium: 4.8 mEq/L (ref 3.5–5.1)
Sodium: 136 mEq/L (ref 135–145)

## 2016-11-10 LAB — HEMOGLOBIN A1C: Hgb A1c MFr Bld: 8.3 % — ABNORMAL HIGH (ref 4.6–6.5)

## 2016-11-10 MED ORDER — LOSARTAN POTASSIUM 25 MG PO TABS: 25.0000 mg | ORAL_TABLET | Freq: Every day | ORAL | Status: DC

## 2016-11-10 MED ORDER — GLYBURIDE 5 MG PO TABS: ORAL_TABLET | ORAL | Status: DC

## 2016-11-10 MED ORDER — TRAMADOL HCL 50 MG PO TABS: 50.0000 mg | ORAL_TABLET | Freq: Four times a day (QID) | ORAL | Status: DC | PRN

## 2016-11-10 MED ORDER — TRAMADOL HCL 50 MG PO TABS: 50.0000 mg | ORAL_TABLET | Freq: Four times a day (QID) | ORAL | 2 refills | Status: DC | PRN

## 2016-11-10 NOTE — Patient Instructions (Addendum)
Go to the lab on the way out.  We'll contact you with your lab report. Drink a lot of water in the meantime.   You'll need to cut back on the glyburide when your sugar starts to improve.  Take 5mg  of glyburide with each meal and then 5mg  at night if your sugar is >300 prior to going to bed.  When your PM sugar is <300, then cut out the late dose.  Then gradually cut out the midday dose.   No carbs in the meantime.   Take care.  Glad to see you.

## 2016-11-10 NOTE — Progress Notes (Signed)
She had shoulder pain and cortisone injections, each shoulder done concurrently last week.  After the injection, she felt weak and like she was going to pass out.  She went back to the ortho clinic for recheck.  Transported to hospital via EMS.   ER course d/w pt.  Her presentation most likely secondary to dehydration with superimposed vasovagal episode from procedure.   Mild inc in Cr noted.  D/w pt.    Her sugar has been up in the meantime, after shoulder injection.  Her sugar has been up to 500 recently.  She has been taking higher doses of glyburide in the meantime, up to 20mg  a day.  Her sugar was fine until the steroid treatment.   Refill done for tramadol for shoulder pain.  She is going to check to see what kind of DM2 strips she uses so we can refill later when needed.   She has some occ mild nausea but doesn't feel sick o/w.    Meds, vitals, and allergies reviewed.   ROS: Per HPI unless specifically indicated in ROS section   GEN: nad, alert and oriented HEENT: mucous membranes moist NECK: supple w/o LA CV: rrr.  PULM: ctab, no inc wob ABD: soft, +bs EXT: no edema

## 2016-11-12 NOTE — Assessment & Plan Note (Signed)
With reactive hyperglycemia likely related to bilateral shoulder injection. Discussed with patient about avoiding double injection in the future. Continue drinking plenty of fluids in the meantime. Would take glyburide 5 mg with each meal and also take a dose before she goes to bed at night. When her evening sugar is lower, below 300, then she can cut out the evening dose. Low-carb diet in the meantime. She will likely need to trim her glyburide dose relatively quickly once her sugar begins to improve and the effect of the steroid wears off. At this point still okay for outpatient follow-up. She agrees with plan.  With relative dehydration noted, ER course discussed with patient. Recheck labs today. Update me as needed. She agrees.

## 2016-11-13 ENCOUNTER — Other Ambulatory Visit: Payer: Self-pay | Admitting: Family Medicine

## 2016-11-13 MED ORDER — GLUCOSE BLOOD VI STRP: ORAL_STRIP | 1 refills | Status: DC

## 2016-12-21 ENCOUNTER — Telehealth: Payer: Self-pay | Admitting: Cardiovascular Disease

## 2016-12-21 NOTE — Telephone Encounter (Signed)
New message    Pt is calling asking for someone in the lipid clinic to call. She said she has some questions about her repatha.

## 2016-12-21 NOTE — Telephone Encounter (Signed)
Spoke with patient and she states she has had a cough for several weeks. She wonders if this is associated with the Repatha. Cough reported up to 5% in literature. She states it is tolerable and she will remain on medication since working well otherwise. She will call with any additional issues. She states appreciation for call.

## 2017-01-01 ENCOUNTER — Encounter: Payer: Self-pay | Admitting: Family Medicine

## 2017-01-01 ENCOUNTER — Ambulatory Visit (INDEPENDENT_AMBULATORY_CARE_PROVIDER_SITE_OTHER): Payer: Medicare Other | Admitting: Family Medicine

## 2017-01-01 DIAGNOSIS — M542 Cervicalgia: Secondary | ICD-10-CM | POA: Diagnosis not present

## 2017-01-01 MED ORDER — CYCLOBENZAPRINE HCL 5 MG PO TABS: 5.0000 mg | ORAL_TABLET | Freq: Three times a day (TID) | ORAL | 1 refills | Status: DC | PRN

## 2017-01-01 NOTE — Progress Notes (Signed)
Neck and upper back pain, down into the ribs.  Started about 3-4 days ago.  No trauma, no falls.  Had been "doing some stuff" had been cleaning some deep shelves and reaching a lot, awkwardly.  She didn't have a specific injury but later that night it started to hurt.  Prev with radicular pain into the R arm but that is better today.  She used tramadol with some relief in the meantime.  No leg pain.  No B/B sx.  She has h/o neck surgery prev.  She has h/o HA in the past when her neck pain will flare up- that is usually worse with carrying a lot of objects.  She hasn't used muscle relaxers recently, but was able to flexeril in the past with some relief and was able to tolerate.    Meds, vitals, and allergies reviewed.   ROS: Per HPI unless specifically indicated in ROS section   nad ncat Neck supple, some dec in ROM due to muscle tightness but not stiff- her neck is sore in the paraspinal muscles posteriorly, with occiput tenderness B No LA or neck bruising or rash.  mmm rrr Ctab S/S/DTRs wnl BUE

## 2017-01-01 NOTE — Patient Instructions (Signed)
Take celebrex and tramadol as needed for pain.  Flexeril as needed for muscle spasms, sedation caution. If worse in the meantime, then we can set up the imaging.  Take care.  Glad to see you.  Glad you are some better in the meantime.

## 2017-01-02 DIAGNOSIS — M542 Cervicalgia: Secondary | ICD-10-CM | POA: Insufficient documentation

## 2017-01-02 NOTE — Assessment & Plan Note (Signed)
Defer imaging at this point as pain is some better .  She agrees.  Likely muscle strain at this point.  She could have irritated a nerve root but the radicular pain is resolved now.  Continue tramadol, add on flexeril with routine cautions and update me as needed.  She agrees.

## 2017-02-22 ENCOUNTER — Encounter (HOSPITAL_COMMUNITY): Payer: Self-pay | Admitting: Emergency Medicine

## 2017-02-22 ENCOUNTER — Emergency Department (HOSPITAL_COMMUNITY): Payer: Medicare Other

## 2017-02-22 ENCOUNTER — Emergency Department (HOSPITAL_COMMUNITY)
Admission: EM | Admit: 2017-02-22 | Discharge: 2017-02-22 | Disposition: A | Discharge status: Home / Self Care | Payer: Medicare Other | Attending: Emergency Medicine | Admitting: Emergency Medicine

## 2017-02-22 DIAGNOSIS — I2510 Atherosclerotic heart disease of native coronary artery without angina pectoris: Secondary | ICD-10-CM | POA: Diagnosis not present

## 2017-02-22 DIAGNOSIS — X398XXA Other exposure to forces of nature, initial encounter: Secondary | ICD-10-CM | POA: Insufficient documentation

## 2017-02-22 DIAGNOSIS — Y999 Unspecified external cause status: Secondary | ICD-10-CM | POA: Diagnosis not present

## 2017-02-22 DIAGNOSIS — Z7984 Long term (current) use of oral hypoglycemic drugs: Secondary | ICD-10-CM | POA: Diagnosis not present

## 2017-02-22 DIAGNOSIS — Y939 Activity, unspecified: Secondary | ICD-10-CM | POA: Diagnosis not present

## 2017-02-22 DIAGNOSIS — W19XXXA Unspecified fall, initial encounter: Secondary | ICD-10-CM

## 2017-02-22 DIAGNOSIS — Y92008 Other place in unspecified non-institutional (private) residence as the place of occurrence of the external cause: Secondary | ICD-10-CM | POA: Insufficient documentation

## 2017-02-22 DIAGNOSIS — E119 Type 2 diabetes mellitus without complications: Secondary | ICD-10-CM | POA: Diagnosis not present

## 2017-02-22 DIAGNOSIS — S0101XA Laceration without foreign body of scalp, initial encounter: Secondary | ICD-10-CM | POA: Diagnosis not present

## 2017-02-22 DIAGNOSIS — Z23 Encounter for immunization: Secondary | ICD-10-CM | POA: Insufficient documentation

## 2017-02-22 DIAGNOSIS — I10 Essential (primary) hypertension: Secondary | ICD-10-CM | POA: Insufficient documentation

## 2017-02-22 DIAGNOSIS — Z7982 Long term (current) use of aspirin: Secondary | ICD-10-CM | POA: Insufficient documentation

## 2017-02-22 DIAGNOSIS — Z79899 Other long term (current) drug therapy: Secondary | ICD-10-CM | POA: Diagnosis not present

## 2017-02-22 LAB — I-STAT CHEM 8, ED
BUN: 12 mg/dL (ref 6–20)
CALCIUM ION: 1.17 mmol/L (ref 1.15–1.40)
CHLORIDE: 103 mmol/L (ref 101–111)
Creatinine, Ser: 0.8 mg/dL (ref 0.44–1.00)
Glucose, Bld: 189 mg/dL — ABNORMAL HIGH (ref 65–99)
HCT: 36 % (ref 36.0–46.0)
Hemoglobin: 12.2 g/dL (ref 12.0–15.0)
Potassium: 3.8 mmol/L (ref 3.5–5.1)
Sodium: 142 mmol/L (ref 135–145)
TCO2: 24 mmol/L (ref 22–32)

## 2017-02-22 MED ORDER — LIDOCAINE-EPINEPHRINE (PF) 2 %-1:200000 IJ SOLN
10.0000 mL | Freq: Once | INTRAMUSCULAR | Status: AC
  Administered 2017-02-22: 10 mL via INTRADERMAL

## 2017-02-22 MED ORDER — TRAMADOL HCL 50 MG PO TABS
50.0000 mg | ORAL_TABLET | Freq: Once | ORAL | Status: AC
  Administered 2017-02-22: 50 mg via ORAL
  Filled 2017-02-22: qty 1

## 2017-02-22 MED ORDER — TETANUS-DIPHTH-ACELL PERTUSSIS 5-2.5-18.5 LF-MCG/0.5 IM SUSP
0.5000 mL | Freq: Once | INTRAMUSCULAR | Status: AC
  Administered 2017-02-22: 0.5 mL via INTRAMUSCULAR
  Filled 2017-02-22: qty 0.5

## 2017-02-22 NOTE — ED Notes (Signed)
Per Dr. Silverio Lay, C collar to be removed. Pt c collar removed. Amulbated with two staff assist. Stated she felt woozy.
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
## 2017-02-22 NOTE — Discharge Instructions (Signed)
Please followup with primary doctor in 7 days for suture removal. Please continue ibuprofen/tylenol as needed for pain.

## 2017-02-25 ENCOUNTER — Telehealth: Payer: Self-pay | Admitting: Family Medicine

## 2017-02-25 DIAGNOSIS — E079 Disorder of thyroid, unspecified: Secondary | ICD-10-CM

## 2017-02-25 NOTE — Telephone Encounter (Signed)
Call pt.  ER notes reviewed.   RIGHT thyroid mass incidentally noted, 17 x 13 mm; followup nonemergent thyroid u/s recommended.  Not emergent.  I put in the order.  Thanks.

## 2017-02-26 NOTE — Telephone Encounter (Signed)
Left message on patient's voicemail to return call

## 2017-02-26 NOTE — Telephone Encounter (Signed)
Patient advised.

## 2017-02-28 ENCOUNTER — Other Ambulatory Visit: Payer: Self-pay | Admitting: Neurological Surgery

## 2017-02-28 ENCOUNTER — Ambulatory Visit
Admission: RE | Admit: 2017-02-28 | Discharge: 2017-02-28 | Disposition: A | Discharge status: Home / Self Care | Payer: Medicare Other | Source: Ambulatory Visit | Attending: Family Medicine | Admitting: Family Medicine

## 2017-02-28 DIAGNOSIS — E079 Disorder of thyroid, unspecified: Secondary | ICD-10-CM

## 2017-02-28 DIAGNOSIS — M4726 Other spondylosis with radiculopathy, lumbar region: Secondary | ICD-10-CM

## 2017-03-01 ENCOUNTER — Encounter: Payer: Self-pay | Admitting: Family Medicine

## 2017-03-01 ENCOUNTER — Ambulatory Visit (INDEPENDENT_AMBULATORY_CARE_PROVIDER_SITE_OTHER): Payer: Medicare Other | Admitting: Family Medicine

## 2017-03-01 VITALS — BP 128/78 | HR 79 | Temp 98.4°F | Ht 65.0 in | Wt 176.8 lb

## 2017-03-01 DIAGNOSIS — S060X0D Concussion without loss of consciousness, subsequent encounter: Secondary | ICD-10-CM | POA: Diagnosis not present

## 2017-03-01 DIAGNOSIS — E042 Nontoxic multinodular goiter: Secondary | ICD-10-CM

## 2017-03-01 DIAGNOSIS — Z23 Encounter for immunization: Secondary | ICD-10-CM

## 2017-03-01 NOTE — Patient Instructions (Signed)
It looks like all of the stitching is out.  Gently wash and rinse.  If you notice anything else or have other concerns, then I can recheck it.  We'll get you set up with endocrine in the meantime.  Take care.  Glad to see you.
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
## 2017-03-02 ENCOUNTER — Encounter: Payer: Self-pay | Admitting: Family Medicine

## 2017-03-02 NOTE — Assessment & Plan Note (Signed)
I don't think the patient is going to have to have a biopsy. She would like to avoid a biopsy if possible. I think it's reasonable to talk to the endocrine clinic just to get their input. Given the stability of the nodules over the years, she should be okay. I would like endocrine input on reasonable follow-up at this point, if further imaging should be done, and if so when. >25 minutes spent in face to face time with patient, >50% spent in counselling or coordination of care.
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
## 2017-03-03 ENCOUNTER — Other Ambulatory Visit: Payer: Self-pay | Admitting: Physician Assistant

## 2017-03-07 ENCOUNTER — Telehealth: Payer: Self-pay | Admitting: Family Medicine

## 2017-03-07 NOTE — Telephone Encounter (Signed)
Please call patient. I want her to see the endocrinology clinic. I thought I had put in the referral already but evidently I did not put it in at the visit. She was corrected call back and check on this and I appreciate her diligence. I put in the referral now and we will work on getting her an appointment. I apologize for the delay.  Thanks.

## 2017-03-07 NOTE — Addendum Note (Signed)
Addended by: Joaquim NamUNCAN, Kaitlin Ardito S on: 03/07/2017 10:53 AM   Modules accepted: Orders

## 2017-03-07 NOTE — Telephone Encounter (Signed)
Copied from CRM [id number]. Topic: Referral - Request >> Mar 06, 2017  1:41 PM Maria [name], Maria [name], NT wrote: Reason for CRM: pt states Dr. Para Marchuncan would like for her to see a specialist but she does not know who to make an appt with. Please contact pt with this information.

## 2017-03-07 NOTE — Telephone Encounter (Signed)
Patient notified as instructed by telephone and verbalized understanding. Advised patient that she will hear back from one of the referral coordinators to get this set up for her.

## 2017-03-12 NOTE — Progress Notes (Signed)
Patient ID: Maria [name], female   DOB: 03-09-1941, 76 y.o.   MRN: [id number]    Cardiology Office Note Date:  03/13/2017  Patient ID:  Maria [name], DOB 03-09-1941, MRN [id number] PCP:  Joaquim Namuncan, Graham S, MD  Cardiologist:  Eden EmmsNishan   Chief Complaint: f/u stenting  History of Present Illness: Maria ManuelBarbara D Enochs is a 76 y.o. female with history of [facility] HTN, DM, GERD, HLD (intolerant of multiple statins) now on repatha , SVT s/p ablation 06/2014   Per review of history, she underwent catheter ablation of a parahisian atrial tachycardia which was successfully ablated from the non-coronary cusp of the aortic root. Some pauses on beta blocker so does decreased She had an abnormal stress test notable for circumflex stenosis that was initially managed medically, LVEF normal She continued to have angina thus was brought in for elective PCI 01/22/15 and underwent DES to mLCx; otherwise had 30% pRCA and 35% D1. DAPT was recommended for 1 year. HR and BP remained stable on current regimen.  2017  chest pain with eating seen by GI Buccini History of GERD  Had f/u cath to r/o angina  06/10/15 non obstructive CAD  Patent stent in circumflex  08/22/16 : more chest pain myovue normal EF 72% no ischemia   Started repatha has a bit of a cough with it  Still with dizziness will stop imdur since no CAD by cath  Conclusion     Prox RCA lesion, 30% stenosed.  1st Diag lesion, 35% stenosed.  The left ventricular systolic function is normal.  1. Nonobstructive CAD. Continued patency of the stent in the LCx. 2. Normal LV function.  Continue medical therapy. Consider switching Brilinta to alternative antiplatelet agent.       Past Medical History:  Diagnosis Date  . Anemia 05/1985  . Carotid artery disease (HCC)    a. 02/2015 - 1-39% bilaterally.  . Chronic back pain   . Concussion   . Coronary artery disease    a. [facility].  . DDD (degenerative disc disease),  cervical   . DDD (degenerative disc disease), lumbar   . Fracture of lower leg 07/2001   Right  . GERD (gastroesophageal reflux disease)   . Hyperlipidemia   . Hypertension   . Multinodular thyroid   . Pneumonia ?1990's X 1  . PSVT (paroxysmal supraventricular tachycardia) (HCC)    a. s/p catheter ablation of a parahisian atrial tachycardia which was successfully ablated from the non-coronary cusp of the aortic root 06/2014.  [name]. Renal cyst 07/10/2006   bilateral  . Right knee pain 02/2010   Injected - Dr. Shelle IronBeane  . Sinus pause    a. By event monitoring 09/2014 - BB stopped at that time.  . Syncope and collapse 07/2001   "that's when I broke my leg"  . Type II diabetes mellitus (HCC) 2002    Past Surgical History:  Procedure Laterality Date  . ANTERIOR CERVICAL DECOMP/DISCECTOMY FUSION  10/03/2005   C4/5; C5/6  "it's got a plate in there"  . BACK SURGERY    . BREAST BIOPSY Bilateral 1968 (multiple)   "all benign"  . BREAST LUMPECTOMY Bilateral 1968  . CARDIAC CATHETERIZATION N/A 11/18/2014   Procedure: Left Heart Cath and Coronary Angiography;  Surgeon: Ladawn Boullion M SwazilandJordan, MD;  [facility];  Service: Cardiovascular;  Laterality: N/A;  . CARDIAC CATHETERIZATION N/A 01/22/2015   Procedure: Coronary Stent Intervention;  Surgeon: Demetria PorePeter M  [place], MD;  Location: [facility] INVASIVE CV LAB;  Service: Cardiovascular;  Laterality: N/A;  . CARDIAC CATHETERIZATION N/A 06/10/2015   Procedure: Left Heart Cath and Coronary Angiography;  Surgeon: Spirit Wernli M [place], MD;  Location: [facility] INVASIVE CV LAB;  Service: Cardiovascular;  Laterality: N/A;  . CATARACT EXTRACTION W/ INTRAOCULAR LENS  IMPLANT, BILATERAL Bilateral 12/2010  . CORONARY ANGIOPLASTY    . ESOPHAGOGASTRODUODENOSCOPY  08/14/2005   gastropathy biopsy, negative  . LAPAROSCOPIC CHOLECYSTECTOMY  08/1992  . POSTERIOR LAMINECTOMY / DECOMPRESSION LUMBAR SPINE  09/2001   due to herniated disc  . SUPRAVENTRICULAR TACHYCARDIA ABLATION N/A 07/06/2014    Procedure: SUPRAVENTRICULAR TACHYCARDIA ABLATION;  Surgeon: Marinus Maw, MD;  Location: [facility] CATH LAB;  Service: Cardiovascular;  Laterality: N/A;  . VAGINAL HYSTERECTOMY  1968   spotting    Current Outpatient Prescriptions  Medication Sig Dispense Refill  . aspirin EC 81 MG tablet Take 81 mg by mouth once.     [name] atenolol (TENORMIN) 25 MG tablet TAKE 1/2 TABLET (12.5 MG TOTAL) BY MOUTH DAILY. 45 tablet 1  . celecoxib (CELEBREX) 200 MG capsule Take 1 capsule (200 mg total) by mouth daily as needed. 30 capsule 5  . cyclobenzaprine (FLEXERIL) 5 MG tablet Take 1 tablet (5 mg total) by mouth 3 (three) times daily as needed for muscle spasms (sedation caution). 30 tablet 1  . Evolocumab (REPATHA SURECLICK) 140 MG/ML SOAJ Inject 1 pen into the skin every 14 (fourteen) days. 2 pen 11  . glucose blood (ONE TOUCH ULTRA TEST) test strip Use as instructed to test blood sugar three times daily. 300 each 1  . glyBURIDE (DIABETA) 5 MG tablet TAKE 1 TABLET BY MOUTH EVERY MORNING AND 1/2 TO 1 TABLET EVERY EVENING if needed (Patient taking differently: Take 2.5-5 mg by mouth See admin instructions. TAKE 5 mg  TABLET BY MOUTH EVERY MORNING AND 2.5 mg  TABLET EVERY EVENING)    . ibuprofen (ADVIL,MOTRIN) 200 MG tablet Take 400 mg by mouth every 8 (eight) hours as needed for mild pain or moderate pain.    [name] loperamide (IMODIUM) 2 MG capsule Take 1 mg by mouth as needed for diarrhea or loose stools.    [name] losartan (COZAAR) 25 MG tablet Take 1 tablet (25 mg total) by mouth at bedtime.    . nitroGLYCERIN (NITROSTAT) 0.4 MG SL tablet Place 0.4 mg under the tongue every 5 (five) minutes as needed for chest pain (no more than 3 doses).    [name] omeprazole (PRILOSEC) 40 MG capsule Take 40 mg by mouth daily as needed (Heartburn/Acid Reflux).     . traMADol (ULTRAM) 50 MG tablet Take 1 tablet (50 mg total) by mouth every 6 (six) hours as needed for moderate pain (sedation caution). 60 tablet 2   No current  facility-administered medications for this visit.     Allergies:   Acetaminophen; Carvedilol; Crestor [rosuvastatin]; Ezetimibe-simvastatin; Ibuprofen; Insulin glargine; Levemir [insulin detemir]; Macrobid [nitrofurantoin macrocrystal]; Metformin; Metoprolol tartrate; Naproxen; and Pravastatin   Social History:  The patient  reports that she has never smoked. She has never used smokeless tobacco. She reports that she does not drink alcohol or use drugs.   Family History:  The patient's family history includes Breast cancer in her maternal aunt; Depression in her paternal aunt; Diabetes in her brother, maternal grandmother, paternal grandfather, paternal grandmother, and sister; Heart disease in her maternal grandfather and mother; Hypertension in her maternal grandfather, maternal grandmother, mother, paternal grandfather, and paternal grandmother; Myasthenia gravis in her  sister; Stroke in her maternal grandmother.  ROS:  Please see the history of present illness. No headaches, visual changes, focal weakness reported. All other systems are reviewed and otherwise negative.   PHYSICAL EXAM:  BP 128/78   Pulse 77   Ht 5\' 5"  (1.651 m)   Wt 172 lb (78 kg)   SpO2 99%   BMI 28.62 kg/m  Affect appropriate Healthy:  appears stated age HEENT: normal Neck supple with no adenopathy JVP normal no bruits left palpable thyroid nodule  Lungs clear with no wheezing and good diaphragmatic motion Heart:  S1/S2 no murmur, no rub, gallop or click PMI normal Abdomen: benighn, BS positve, no tenderness, no AAA no bruit.  No HSM or HJR Distal pulses intact with no bruits No edema Neuro non-focal Skin warm and dry No muscular weakness    EKG:   NSR 65bpm, low voltage QRS, no acute changes  Recent Labs: 07/05/2016: TSH 2.98 10/26/2016: ALT 15 11/01/2016: Platelets 348 02/22/2017: BUN 12; Creatinine, Ser 0.80; Hemoglobin 12.2; Potassium 3.8; Sodium 142  07/05/2016: VLDL 18.4 10/26/2016: Chol/HDL Ratio  2.4; Cholesterol, Total 106; HDL 45; LDL Calculated 43; Triglycerides 88   Estimated Creatinine Clearance: 60.8 mL/min (by C-G formula based on SCr of 0.8 mg/dL).   Wt Readings from Last 3 Encounters:  03/13/17 172 lb (78 kg)  03/01/17 176 lb 12.8 oz (80.2 kg)  01/01/17 173 lb 8 oz (78.7 kg)     Other studies reviewed: Additional studies/records reviewed today include: summarized above  ASSESSMENT AND PLAN:   CAD s/p PCI as above -   01/22/15 cirecumflex DES  Cath September 2017 no restenosis Myovue 08/22/16 no ischemia  Continue medical Rx    Dizziness - improved with cutting back BP meds stop imdur as cath ok and pain non cardiac     HTN - Well controlled.  Continue current medications and low sodium Dash type diet.     HLD - on Repatha now follow cough improved   Lab Results  Component Value Date   LDLCALC 43 10/26/2016     Paroxysmal SVT with history of sinus pause on event monitoring - see above.   Thyroid nodule: may need biopsy seeing [facility] next month   Disposition: f/u with me in 6 months    Current medicines are reviewed at length with the patient today.  The patient did not have any concerns regarding medicines.  Charlton Haws

## 2017-03-13 ENCOUNTER — Encounter: Payer: Self-pay | Admitting: Cardiovascular Disease

## 2017-03-13 ENCOUNTER — Ambulatory Visit (INDEPENDENT_AMBULATORY_CARE_PROVIDER_SITE_OTHER): Payer: Medicare Other | Admitting: Cardiovascular Disease

## 2017-03-13 VITALS — BP 128/78 | HR 77 | Ht 65.0 in | Wt 172.0 lb

## 2017-03-13 DIAGNOSIS — I2510 Atherosclerotic heart disease of native coronary artery without angina pectoris: Secondary | ICD-10-CM | POA: Diagnosis not present

## 2017-03-13 NOTE — Patient Instructions (Signed)
Medication Instructions:  Your physician has recommended you make the following change in your medication:  1-STOP Imdur   Labwork: NONE  Testing/Procedures: NONE  Follow-Up: Your physician wants you to follow-up in: 12 months with Dr. Eden EmmsNishan. You will receive a reminder letter in the mail two months in advance. If you don't receive a letter, please call our office to schedule the follow-up appointment.   If you need a refill on your cardiac medications before your next appointment, please call your pharmacy.

## 2017-03-15 ENCOUNTER — Ambulatory Visit
Admission: RE | Admit: 2017-03-15 | Discharge: 2017-03-15 | Disposition: A | Discharge status: Home / Self Care | Payer: Medicare Other | Source: Ambulatory Visit | Attending: Neurological Surgery | Admitting: Neurological Surgery

## 2017-03-15 DIAGNOSIS — M4726 Other spondylosis with radiculopathy, lumbar region: Secondary | ICD-10-CM

## 2017-03-15 MED ORDER — GADOBENATE DIMEGLUMINE 529 MG/ML IV SOLN: 16.0000 mL | Freq: Once | INTRAVENOUS | Status: DC | PRN

## 2017-05-10 ENCOUNTER — Telehealth: Payer: Self-pay | Admitting: Pharmacist

## 2017-05-10 NOTE — Telephone Encounter (Addendum)
Patient reports that her voice has been coming and going since starting repatha. She reports that she called the drug company and they advised her to go off of the medication. She states she is not SOB, but does get "choked up or strangled" easier than before when she swallows. She denies any swelling in lips or throat. She states this "strangled" feeling is intolerable to her and she wishes to remain off the the medication. Discussed options including trial on Praluent or clinical trial.   After discussion she would be willing to do trial on Praluent if able to obtain coverage through insurance since her cholesterol has done so well on Repatha.   We will send for coverage of Praluent. Will also route to Dr. Eden EmmsNishan for any additional recommendations. Pt is appreciative for help. Will notify her once coverage for Praluent obtained. Would plan for her to let symptoms of Repatha complete resolve before beginning Praluent.

## 2017-05-10 NOTE — Telephone Encounter (Signed)
Ok to hold repatha and try praluent if covered

## 2017-05-11 NOTE — Telephone Encounter (Signed)
PA returned that Praluent does not require PA in 2019; thus patient will be able to fill when ready.  Spoke with patient about above. Will call her in 3-4 weeks to see how her voice is doing. If at that time improved will send RX for Praluent. Pt is in agreement with this plan. She will call with any other issues.

## 2017-05-16 ENCOUNTER — Other Ambulatory Visit: Payer: Self-pay | Admitting: Family Medicine

## 2017-05-16 NOTE — Telephone Encounter (Signed)
Electronic refill Last refill 01/04/16 #30/5 Last office visit 03/01/17 See allergy/contraindication

## 2017-05-17 NOTE — Telephone Encounter (Signed)
Sent. Thanks.   

## 2017-05-18 ENCOUNTER — Ambulatory Visit: Payer: Medicare Other | Admitting: Internal Medicine

## 2017-05-18 ENCOUNTER — Encounter: Payer: Self-pay | Admitting: Internal Medicine

## 2017-05-18 VITALS — BP 148/82 | HR 77 | Ht 65.0 in | Wt 173.0 lb

## 2017-05-18 DIAGNOSIS — E042 Nontoxic multinodular goiter: Secondary | ICD-10-CM | POA: Diagnosis not present

## 2017-05-18 DIAGNOSIS — E1165 Type 2 diabetes mellitus with hyperglycemia: Secondary | ICD-10-CM

## 2017-05-18 LAB — POCT GLYCOSYLATED HEMOGLOBIN (HGB A1C): HEMOGLOBIN A1C: 9.9

## 2017-05-18 MED ORDER — INSULIN DEGLUDEC 100 UNIT/ML ~~LOC~~ SOPN: 20.0000 [IU] | PEN_INJECTOR | Freq: Every day | SUBCUTANEOUS | 3 refills | Status: DC

## 2017-05-18 MED ORDER — GLIPIZIDE ER 5 MG PO TB24: 5.0000 mg | ORAL_TABLET | Freq: Every day | ORAL | 3 refills | Status: DC

## 2017-05-18 MED ORDER — PEN NEEDLES 32G X 4 MM MISC: 1.0000 | Freq: Every day | 3 refills | Status: DC

## 2017-05-18 NOTE — Progress Notes (Addendum)
Patient ID: Maria ManuelBarbara D [name], female   DOB: 07-11-1940, 77 y.o.   MRN: [id number]   HPI: Maria ManuelBarbara D [name] is a 77 y.o.-year-old female, referred by her PCP, Dr. Para Marchuncan, for management of thyroid nodules. She would also want me to address her DM2, dx in 1990s, non-insulin-dependent, uncontrolled, without long-term complications.  She had 2 steroid inj in shoulder in 03/2017 >> sugars 500s, then 200-300, not improving.  Last hemoglobin A1c was: Lab Results  Component Value Date   HGBA1C 8.3 (H) 11/10/2016   HGBA1C 6.7 (H) 07/05/2016   HGBA1C 7.1 (H) 01/04/2016   Pt is on a regimen of: - Glyburide 5 mg 2x a day She has multiple medication intolerances, including to metformin (GI upset), Lantus and Levemir (rash)  Pt checks her sugars 2x  a day and they are: - am: 300s now - 2h after b'fast: n/c - before lunch: n/c - 2h after lunch: n/c - before dinner: n/c - 2h after dinner: 400-450 - bedtime: n/c - nighttime: n/c No lows. Lowest sugar was 88; she has hypoglycemia awareness at 80s.  Highest sugar was 500s.  Glucometer: AccuChek  Pt's meals are: - Breakfast: cereal + 2% milk - Lunch: sandwich + soup - Dinner: PB sandwich +/- jelly; grilled cheese; out during weekends - Snacks: 0-1: yoghurt Dances on Sat night.   - no CKD, last BUN/creatinine:  Lab Results  Component Value Date   BUN 12 02/22/2017   BUN 37 (H) 11/10/2016   CREATININE 0.80 02/22/2017   CREATININE 1.21 (H) 11/10/2016  On losartan. - + HL; last set of lipids: Lab Results  Component Value Date   CHOL 106 10/26/2016   HDL 45 10/26/2016   LDLCALC 43 10/26/2016   LDLDIRECT 168.1 02/18/2010   TRIG 88 10/26/2016   CHOLHDL 2.4 10/26/2016   - last eye exam was in 05/2016. No DR.  - no numbness and tingling in her feet. On ASA 81.  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 Feels strangled >> coughing; Also, hoarseness  TSH level normal at last check: Lab Results  Component Value Date   TSH 2.98 07/05/2016   ROS: Constitutional: no weight gain/loss, no fatigue, no subjective hyperthermia/hypothermia, + nocturia Eyes: no blurry vision, no xerophthalmia ENT: no sore throat, no nodules palpated in throat, no dysphagia/odynophagia, no hoarseness, + decreased hearing Cardiovascular: no CP/+ SOB/+ palpitations/no leg swelling Respiratory: + cough/+ SOB Gastrointestinal: no N/V/+ D/no C Musculoskeletal: + muscle/no joint aches Skin: no rashes Neurological: no tremors/numbness/tingling/dizziness Psychiatric: no depression/anxiety  Past Medical History:  Diagnosis Date  . Anemia 05/1985  . Carotid artery disease (HCC)    a. 02/2015 - 1-39% bilaterally.  . Chronic back pain   . Concussion   . Coronary artery disease    [facility]. [facility]  . DDD (degenerative disc disease), cervical   . DDD (degenerative disc disease), lumbar   . Fracture of lower leg 07/2001   Right  . GERD (gastroesophageal reflux disease)   . Hyperlipidemia   . Hypertension   . Multinodular thyroid   . Pneumonia ?1990's X 1  . PSVT (paroxysmal supraventricular tachycardia) (HCC)  a. s/p catheter ablation of a parahisian atrial tachycardia which was successfully ablated from the non-coronary cusp of the aortic root 06/2014.  [name] Renal cyst 07/10/2006   bilateral  . Right knee pain 02/2010   Injected - Dr. Shelle Iron  . Sinus pause    a. By event monitoring 09/2014 - BB stopped at that time.  . Syncope and collapse 07/2001   "that's when I broke my leg"  . Type II diabetes mellitus (HCC) 2002   Past Surgical History:  Procedure Laterality Date  . ANTERIOR CERVICAL  DECOMP/DISCECTOMY FUSION  10/03/2005   C4/5; C5/6  "it's got a plate in there"  . BACK SURGERY    . BREAST BIOPSY Bilateral 1968 (multiple)   "all benign"  . BREAST LUMPECTOMY Bilateral 1968  . CARDIAC CATHETERIZATION N/A 11/18/2014   Procedure: Left Heart Cath and Coronary Angiography;  Surgeon: Peter M Swaziland, MD;  Location: [facility] INVASIVE CV LAB;  Service: Cardiovascular;  Laterality: N/A;  . CARDIAC CATHETERIZATION N/A 01/22/2015   Procedure: Coronary Stent Intervention;  Surgeon: Peter M Swaziland, MD;  Location: [facility] INVASIVE CV LAB;  Service: Cardiovascular;  Laterality: N/A;  . CARDIAC CATHETERIZATION N/A 06/10/2015   Procedure: Left Heart Cath and Coronary Angiography;  Surgeon: Peter M Swaziland, MD;  Location: [facility] INVASIVE CV LAB;  Service: Cardiovascular;  Laterality: N/A;  . CATARACT EXTRACTION W/ INTRAOCULAR LENS  IMPLANT, BILATERAL Bilateral 12/2010  . CORONARY ANGIOPLASTY    . ESOPHAGOGASTRODUODENOSCOPY  08/14/2005   gastropathy biopsy, negative  . LAPAROSCOPIC CHOLECYSTECTOMY  08/1992  . POSTERIOR LAMINECTOMY / DECOMPRESSION LUMBAR SPINE  09/2001   due to herniated disc  . SUPRAVENTRICULAR TACHYCARDIA ABLATION N/A 07/06/2014   Procedure: SUPRAVENTRICULAR TACHYCARDIA ABLATION;  Surgeon: Marinus Maw, MD;  Location: [facility] CATH LAB;  Service: Cardiovascular;  Laterality: N/A;  . VAGINAL HYSTERECTOMY  1968   spotting   Social History   Socioeconomic History  . Marital status: Married    Spouse name: Not on file  . Number of children: 2  . Years of education: Not on file  . Highest education level: Not on file  Social Needs  . Financial resource strain: Not on file  . Food insecurity - worry: Not on file  . Food insecurity - inability: Not on file  . Transportation needs - medical: Not on file  . Transportation needs - non-medical: Not on file  Occupational History  . Occupation: Retired; Hotel manager, [employer]    Employer: RETIRED    Comment: Works a couple days  a week  Tobacco Use  . Smoking status: Never Smoker  . Smokeless tobacco: Never Used  Substance and Sexual Activity  . Alcohol use: No    Alcohol/week: 0.0 oz  . Drug use: No  . Sexual activity: Yes  Other Topics Concern  . Not on file  Social History Narrative   Widowed 2011 after 52 years of marriage (husband had C.diff)   Remarried 2015   Current Outpatient Medications on File Prior to Visit  Medication Sig Dispense Refill  . aspirin EC 81 MG tablet Take 81 mg by mouth once.     [name] atenolol (TENORMIN) 25 MG tablet TAKE 1/2 TABLET (12.5 MG TOTAL) BY MOUTH DAILY. 45 tablet 1  . celecoxib (CELEBREX) 200 MG capsule TAKE 1 CAPSULE BY MOUTH DAILY AS NEEDED. 30 capsule 5  . cyclobenzaprine (FLEXERIL) 5 MG tablet Take 1 tablet (5 mg total) by mouth 3 (three) times daily as needed for muscle spasms (sedation  caution). 30 tablet 1  . glucose blood (ONE TOUCH ULTRA TEST) test strip Use as instructed to test blood sugar three times daily. 300 each 1  . glyBURIDE (DIABETA) 5 MG tablet TAKE 1 TABLET BY MOUTH EVERY MORNING AND 1/2 TO 1 TABLET EVERY EVENING if needed (Patient taking differently: Take 2.5-5 mg by mouth See admin instructions. TAKE 5 mg  TABLET BY MOUTH EVERY MORNING AND 2.5 mg  TABLET EVERY EVENING)    . ibuprofen (ADVIL,MOTRIN) 200 MG tablet Take 400 mg by mouth every 8 (eight) hours as needed for mild pain or moderate pain.    [name] loperamide (IMODIUM) 2 MG capsule Take 1 mg by mouth as needed for diarrhea or loose stools.    [name] losartan (COZAAR) 25 MG tablet Take 1 tablet (25 mg total) by mouth at bedtime.    . nitroGLYCERIN (NITROSTAT) 0.4 MG SL tablet Place 0.4 mg under the tongue every 5 (five) minutes as needed for chest pain (no more than 3 doses).    [name] omeprazole (PRILOSEC) 40 MG capsule Take 40 mg by mouth daily as needed (Heartburn/Acid Reflux).     . traMADol (ULTRAM) 50 MG tablet Take 1 tablet (50 mg total) by mouth every 6 (six) hours as needed for moderate pain (sedation  caution). 60 tablet 2   No current facility-administered medications on file prior to visit.    Allergies  Allergen Reactions  . Acetaminophen     REACTION: jittery with plain tylenol, but tolerated tylenol/benadryl combination  . Carvedilol     REACTION: nightmares  . Crestor [Rosuvastatin]     aches  . Ezetimibe-Simvastatin     REACTION: muscle aches  . Ibuprofen     REACTION: GI upset  . Insulin Glargine     Rash  . Levemir [Insulin Detemir]     rash  . Macrobid [Nitrofurantoin Macrocrystal] Other (See Comments)    Achey, N & V  . Metformin     REACTION: GI upset  . Metoprolol Tartrate     REACTION: nightmares  . Naproxen     REACTION: GI upset  . Pravastatin Other (See Comments)    Myalgias   Family History  Problem Relation Age of Onset  . Hypertension Mother   . Heart disease Mother   . Diabetes Sister   . Myasthenia gravis Sister   . Diabetes Brother        1/2 juvenile; foot ulcer; periph. neuropathy  . Heart disease Maternal Grandfather        MI  . Hypertension Maternal Grandfather   . Depression Paternal Aunt   . Hypertension Maternal Grandmother   . Diabetes Maternal Grandmother   . Stroke Maternal Grandmother   . Hypertension Paternal Grandmother   . Diabetes Paternal Grandmother   . Hypertension Paternal Grandfather   . Diabetes Paternal Grandfather   . Breast cancer Maternal Aunt   . Colon cancer Neg Hx     PE: BP (!) 148/82   Pulse 77   Ht 5\' 5"  (1.651 m)   Wt 173 lb (78.5 kg)   SpO2 97%   BMI 28.79 kg/m   Wt Readings from Last 3 Encounters:  05/18/17 173 lb (78.5 kg)  03/13/17 172 lb (78 kg)  03/01/17 176 lb 12.8 oz (80.2 kg)   Constitutional: overweight, in NAD Eyes: PERRLA, EOMI, no exophthalmos ENT: moist mucous membranes, no thyromegaly, no cervical lymphadenopathy Cardiovascular: RRR, No MRG Respiratory: CTA B Gastrointestinal: abdomen soft, NT, ND, BS+ Musculoskeletal: no deformities,  strength intact in all 4 Skin:  moist, warm, no rashes Neurological: no tremor with outstretched hands, DTR normal in all 4  ASSESSMENT: 1. DM2, non-insulin-dependent, uncontrolled, without long-term complications, but with hyperglycemia  2. Thyroid nodules  PLAN:  1. Patient with long-standing, uncontrolled diabetes, on oral antidiabetic regimen, which became insufficient. HbA1c today higher, at 9.9%.  Her sugars were high even before starting steroids in 03/2017, based on the in 10/2016, which was 8.3%.  However, after her steroid course, her sugars increase and did not decrease significantly afterwards, now with sugars in the 200s-300s throughout the day.  We discussed that she is now very glucose toxic and she absolutely needs extra insulin to improve this.  I am not sure whether this would be a long term medication for her, but I am hoping not.  Especially if she starts improving her diet, as we discussed at this visit.  I will also refer her to nutrition for further help with this.  She has previous intolerances to Levemir and Lantus (rash) so we will try to see above.  If this is not covered by her insurance, we can try NPH, but I explained that this is a twice a day medication.  We can try to use pens and then, if not covered, we may need to go to vials.  She refuses to use [facility]. - I am also worried about the fact that glyburide can drive her sugars too low, but we do need mealtime coverage so I suggested to switch to glipizide XL. - I suggested to:  Patient Instructions  We will schedule the thyroid biopsy downstairs in [facility].  Please start: - Tresiba 10 units at night. In 3 days, if sugars in am are not <150, increase the dose by 4 units.  Continue to increase until sugars are < 150 or you reach 30 units.  ** If you cannot get Tresiba, we need to start NPH 10 units before b'fast and 5 units at bedtime**  Stop Glyburide and start: - Glipizide ER 5 mg before b'fast  Please schedule an appt with Oran Rein  with nutrition.  Please return in 1.5 months with your sugar log.   - Strongly advised her to start checking sugars at different times of the day - check once a day, rotating checks - given sugar log and advised how to fill it and to bring it at next appt  - given foot care handout and explained the principles  - given instructions for hypoglycemia management "15-15 rule"  - advised for yearly eye exams  - Return to clinic in 1.5 mo with sugar log   2. Thyroid nodules - I reviewed the images of her thyroid ultrasound along with the patient. I pointed out that the dominant nodule is large, this being a risk factor for cancer.  Otherwise, the nodules are: - not hypoechoic - without microcalcifications - without increased internal blood flow - more wide than tall - well delimited from surrounding tissue Pt does not have a thyroid cancer family history or a personal history of RxTx to head/neck. All these would favor benignity.  - the L nodule has been Bx'ed before >> insufficient sample (2007) - we discussed about repeating thyroid biopsy (FNA) >> she reluctantly agrees - I explained that this is not cancer, we can continue to follow her expectantly, however, she had some neck compression sxs (may need a Barium swallow to check if these are due to thyroid compression or not).  We discussed that if the thyroid nodule is not cancerous, depending on the severity of her neck compression symptoms, we can go ahead with lobectomy.  - Reviewed together her previous TFTs and they were all normal.  We will not repeat them today.  - time spent with the patient: 1 hour, of which >50% was spent in obtaining information about her symptoms, reviewing her previous labs, evaluations, and treatments, counseling her about her conditions (please see the discussed topics above), and developing a plan to further investigate and treat them; she had a number of questions which I addressed.  Orders Placed This  Encounter  Procedures  . [name] FNA BIOPSY THYROID 1ST LESION  . Amb ref to Medical Nutrition Therapy-MNT   Adequacy Reason Satisfactory For Evaluation. Diagnosis THYROID, FINE NEEDLE ASPIRATION LEFTS, LEFT MID LOBE (SPECIMEN 1 OF 1 COLLECTED 05/31/2017) CONSISTENT WITH BENIGN FOLLICULAR NODULE (B[place]ES[place] CATEGORY II). Jimmy Picket MD Pathologist, Electronic Signature (Case signed 06/01/2017) Specimen Clinical Information Left Mid, 2.6cm; Other 2 dimensions: 1.4 x 1.8 cm, previously 2.5 x 1.4 x 1.7cm solid/almost completely solid isoechoic TI-RADS - 3, Mildly suspicious nodule Source Thyroid, Fine Needle Aspiration, Left, LMP (Specimen 1 of 1, collected on 05/31/17)  Thyroid nodule is benign.  States she has some neck compression symptoms, I will order a barium swallow to see if the compression is external or internal.  DG Esophagus  Order: [id number]  Status:  Final result Visible to patient:  No (Not Released) Dx:  Multiple thyroid nodules  Details   Reading Physician Reading Date Result Priority  Dwyane Dee, MD 06/21/2017     Narrative    CLINICAL DATA: Dysphagia  EXAM: ESOPHOGRAM / BARIUM SWALLOW / BARIUM TABLET STUDY  TECHNIQUE: Combined double contrast and single contrast examination performed using effervescent crystals, thick barium liquid, and thin barium liquid. The patient was observed with fluoroscopy swallowing a 13 mm barium sulphate tablet.  FLUOROSCOPY TIME: Fluoroscopy Time: 1 minute 18 seconds  Radiation Exposure Index (if provided by the fluoroscopic device): 61 mGy  Number of Acquired Spot Images: 0  COMPARISON: None.  FINDINGS: Initially rapid sequence spot films of the cervical esophagus were performed. Swallowing mechanism is unremarkable with no penetration or aspiration noted. Anterior fusion plate is present from C4 to C6. A double-contrast barium swallow shows the mucosa of the esophagus to be unremarkable. The gastroesophageal junction  is noted to be somewhat patulous. There does appear to be a small sliding hiatal hernia present. Moderate gastroesophageal reflux is demonstrated. A barium pill was given at the end of the study which passed into the stomach without delay.  IMPRESSION: 1. Small sliding hiatal hernia with moderate gastroesophageal reflux. 2. Barium pill passes into the stomach without delay.   Electronically Signed By: Dwyane Dee M.D. On: 06/21/2017 10:47       Normal barium swallow, without external compression of the thyroid on the esophagus.  She does have moderate GERD.  Carlus Pavlov, MD PhD [facility] Endocrinology

## 2017-05-18 NOTE — Patient Instructions (Addendum)
We will schedule the thyroid biopsy downstairs in [facility].  Please start: - Tresiba 10 units at night. In 3 days, if sugars in am are not <150, increase the dose by 4 units.  Continue to increase until sugars are < 150 or you reach 30 units.  ** If you cannot get Tresiba, we need to start NPH 10 units before b'fast and 5 units at bedtime**  Stop Glyburide and start: - Glipizide ER 5 mg before b'fast  Please schedule an appt with Oran Rein with nutrition.  Please return in 1.5 months with your sugar log.   PATIENT INSTRUCTIONS FOR TYPE 2 DIABETES:  **Please join MyChart!** - see attached instructions about how to join if you have not done so already.  DIET AND EXERCISE Diet and exercise is an important part of diabetic treatment.  We recommended aerobic exercise in the form of brisk walking (working between 40-60% of maximal aerobic capacity, similar to brisk walking) for 150 minutes per week (such as 30 minutes five days per week) along with 3 times per week performing 'resistance' training (using various gauge rubber tubes with handles) 5-10 exercises involving the major muscle groups (upper body, lower body and core) performing 10-15 repetitions (or near fatigue) each exercise. Start at half the above goal but build slowly to reach the above goals. If limited by weight, joint pain, or disability, we recommend daily walking in a swimming pool with water up to waist to reduce pressure from joints while allow for adequate exercise.    BLOOD GLUCOSES Monitoring your blood glucoses is important for continued management of your diabetes. Please check your blood glucoses 2-4 times a day: fasting, before meals and at bedtime (you can rotate these measurements - e.g. one day check before the 3 meals, the next day check before 2 of the meals and before bedtime, etc.).   HYPOGLYCEMIA (low blood sugar) Hypoglycemia is usually a reaction to not eating, exercising, or taking too much insulin/  other diabetes drugs.  Symptoms include tremors, sweating, hunger, confusion, headache, etc. Treat IMMEDIATELY with 15 grams of Carbs: . 4 glucose tablets .  cup regular juice/soda . 2 tablespoons raisins . 4 teaspoons sugar . 1 tablespoon honey Recheck blood glucose in 15 mins and repeat above if still symptomatic/blood glucose <100.  RECOMMENDATIONS TO REDUCE YOUR RISK OF DIABETIC COMPLICATIONS: * Take your prescribed MEDICATION(S) * Follow a DIABETIC diet: Complex carbs, fiber rich foods, (monounsaturated and polyunsaturated) fats * AVOID saturated/trans fats, high fat foods, >2,300 mg salt per day. * EXERCISE at least 5 times a week for 30 minutes or preferably daily.  * DO NOT SMOKE OR DRINK more than 1 drink a day. * Check your FEET every day. Do not wear tightfitting shoes. Contact us if you develop an ulcer * See your EYE doctor once a year or more if needed * Get a FLU shot once a year * Get a PNEUMONIA vaccine once before and once after age 66 years  GOALS:  * Your Hemoglobin A1c of <7%  * fasting sugars need to be <130 * after meals sugars need to be <180 (2h after you start eating) * Your Systolic BP should be 140 or lower  * Your Diastolic BP should be 80 or lower  * Your HDL (Good Cholesterol) should be 40 or higher  * Your LDL (Bad Cholesterol) should be 100 or lower. * Your Triglycerides should be 150 or lower  * Your Urine microalbumin (kidney function) should be <  30 * Your Body Mass Index should be 25 or lower    Please consider the following ways to cut down carbs and fat and increase fiber and micronutrients in your diet: - substitute whole grain for white bread or pasta - substitute brown rice for white rice - substitute 90-calorie flat bread pieces for slices of bread when possible - substitute sweet potatoes or yams for white potatoes - substitute humus for margarine - substitute tofu for cheese when possible - substitute almond or rice milk for  regular milk (would not drink soy milk daily due to concern for soy estrogen influence on breast cancer risk) - substitute Maese chocolate for other sweets when possible - substitute water - can add lemon or orange slices for taste - for diet sodas (artificial sweeteners will trick your body that you can eat sweets without getting calories and will lead you to overeating and weight gain in the long run) - do not skip breakfast or other meals (this will slow down the metabolism and will result in more weight gain over time)  - can try smoothies made from fruit and almond/rice milk in am instead of regular breakfast - can also try old-fashioned (not instant) oatmeal made with almond/rice milk in am - order the dressing on the side when eating salad at a restaurant (pour less than half of the dressing on the salad) - eat as little meat as possible - can try juicing, but should not forget that juicing will get rid of the fiber, so would alternate with eating raw veg./fruits or drinking smoothies - use as little oil as possible, even when using olive oil - can dress a salad with a mix of balsamic vinegar and lemon juice, for e.g. - use agave nectar, stevia sugar, or regular sugar rather than artificial sweateners - steam or broil/roast veggies  - snack on veggies/fruit/nuts (unsalted, preferably) when possible, rather than processed foods - reduce or eliminate aspartame in diet (it is in diet sodas, chewing gum, etc) Read the labels!  Try to read Dr. Katherina RightNeal Barnard's book: "Program for Reversing Diabetes" for other ideas for healthy eating.

## 2017-05-18 NOTE — Addendum Note (Signed)
Addended by: Yolande JollyLAWSON, Stana Bayon on: 05/18/2017 03:00 PM   Modules accepted: Orders

## 2017-05-22 ENCOUNTER — Telehealth: Payer: Self-pay | Admitting: Internal Medicine

## 2017-05-22 NOTE — Telephone Encounter (Signed)
Now, these medicines are not known to cause thyroid nodules.  She should not worry about that.  I doubt Maria [name] would cause her abdominal pain and diarrhea.  Let us go ahead and stop glipizide ER for few days and see if this was the one that was causing her the problems.  Please let us know how this goes.

## 2017-05-22 NOTE — Telephone Encounter (Signed)
It may cause nodules under the skin, due to scarring at the injection site like with any insulin.  That is why, she needs to use a new needle every time she injects.  No thyroid nodules as a side effect.

## 2017-05-22 NOTE — Telephone Encounter (Signed)
Pt is aware and states she understands.

## 2017-05-22 NOTE — Telephone Encounter (Signed)
Patient stated that she is having a reaction to the medication tresiba,stomach cramping and diarrhea. She has more question she stated she would like Dr Elvera LennoxGherghe to call her, Please advise

## 2017-05-22 NOTE — Telephone Encounter (Signed)
Pt stated that she does think the [place] causes nodules because it is in her book of side effects given from the pharmacy. She wanted to read to me exactly what it stated in the pamphlet unfortunately I did not have the time to wait for her to find the paragraph as I have patients arriving.  Agreed to hold Glipizide for a few days to see if that helps with her stomach pain and diarrhea. Please advise

## 2017-05-22 NOTE — Telephone Encounter (Signed)
Pt stated that she was reading her pamphlet on side effects and said that it said it could cause thyroid nodules which she already has, and that it mentioned cardiac issues and she has a stent and doesn't want to take the risk in taking this medication. Please advise

## 2017-05-31 ENCOUNTER — Other Ambulatory Visit (HOSPITAL_COMMUNITY)
Admission: RE | Admit: 2017-05-31 | Discharge: 2017-05-31 | Disposition: A | Discharge status: Home / Self Care | Payer: Medicare Other | Source: Ambulatory Visit | Attending: General Surgery | Admitting: General Surgery

## 2017-05-31 ENCOUNTER — Other Ambulatory Visit: Payer: Medicare Other

## 2017-05-31 ENCOUNTER — Encounter: Payer: Medicare Other | Attending: Internal Medicine | Admitting: Dietician

## 2017-05-31 ENCOUNTER — Ambulatory Visit
Admission: RE | Admit: 2017-05-31 | Discharge: 2017-05-31 | Disposition: A | Discharge status: Home / Self Care | Payer: Medicare Other | Source: Ambulatory Visit | Attending: Internal Medicine | Admitting: Internal Medicine

## 2017-05-31 ENCOUNTER — Encounter: Payer: Self-pay | Admitting: Dietician

## 2017-05-31 DIAGNOSIS — E1159 Type 2 diabetes mellitus with other circulatory complications: Secondary | ICD-10-CM

## 2017-05-31 DIAGNOSIS — E041 Nontoxic single thyroid nodule: Secondary | ICD-10-CM | POA: Diagnosis present

## 2017-05-31 DIAGNOSIS — E1165 Type 2 diabetes mellitus with hyperglycemia: Secondary | ICD-10-CM | POA: Diagnosis not present

## 2017-05-31 DIAGNOSIS — E042 Nontoxic multinodular goiter: Secondary | ICD-10-CM

## 2017-05-31 NOTE — Patient Instructions (Addendum)
Consider unsweetened tea rather than sweet. Stay active even in the winter. (30 minutes) Small amounts of protein with all of your meals. Increase your vegetable intake. Consider meal planning ("my plate")  Aim for 3 Carb Choices per meal (45 grams) +/- 1 either way  Aim for 0-1 Carbs per snack if hungry  Include protein in moderation with your meals and snacks Consider reading food labels for Total Carbohydrate and Fat Grams of foods Continue checking BG at alternate times per day as directed by MD  Continue taking medication as directed by MD

## 2017-05-31 NOTE — Progress Notes (Signed)
Diabetes Self-Management Education  Visit Type: First/Initial  Appt. Start Time: 1300 Appt. End Time: 1410  05/31/2017  Maria [name], identified by name and date of birth, is a 77 y.o. female with a diagnosis of Diabetes: Type 2. Other history includes HTN, hyperlipidemia, and GERD.  She has many medication intolerances/allergies.  She is getting a biopsy for thyroid nodules this afternoon.  Medication includes Glyburide and Tresiba 16 units daily.  Her fasting blood sugar levels have come down to normal range on this dose of insulin. .  Patient lives with her husband.  She is a widow and they have been married 4 years.  They have separate households and share shopping and cooking.  She eats better when with her husband otherwise she just grabs something.  She is a retired Merchandiser, retail for [employer].  Her brother has type 1 diabetes.  ASSESSMENT  Height 5\' 6"  (1.676 m), weight 170 lb (77.1 kg). Body mass index is 27.44 kg/m.  Diabetes Self-Management Education - 05/31/17 1317      Visit Information   Visit Type  First/Initial      Initial Visit   Diabetes Type  Type 2    Are you currently following a meal plan?  No    Are you taking your medications as prescribed?  Yes    Date Diagnosed  1990"s      Health Coping   How would you rate your overall health?  Good      Psychosocial Assessment   Patient Belief/Attitude about Diabetes  Motivated to manage diabetes    Self-care barriers  None    Self-management support  Doctor's office    Other persons present  Patient;Spouse/SO    Patient Concerns  Nutrition/Meal planning;Glycemic Control    Special Needs  None    Preferred Learning Style  No preference indicated    Learning Readiness  Ready    How often do you need to have someone help you when you read instructions, pamphlets, or other written materials from your doctor or pharmacy?  1 - Never    What is the last grade level you completed in school?  12th grade       Pre-Education Assessment   Patient understands the diabetes disease and treatment process.  Needs Review    Patient understands incorporating nutritional management into lifestyle.  Needs Review    Patient undertands incorporating physical activity into lifestyle.  Needs Review    Patient understands using medications safely.  Needs Review    Patient understands monitoring blood glucose, interpreting and using results  Needs Review    Patient understands prevention, detection, and treatment of acute complications.  Needs Review    Patient understands prevention, detection, and treatment of chronic complications.  Needs Review    Patient understands how to develop strategies to address psychosocial issues.  Needs Review    Patient understands how to develop strategies to promote health/change behavior.  Needs Review      Complications   Last HgB A1C per patient/outside source  9.9 % 05/18/17    How often do you check your blood sugar?  3-4 times/day    Fasting Blood glucose range (mg/dL)  08-657    Postprandial Blood glucose range (mg/dL)  >846    Number of hypoglycemic episodes per month  1    Can you tell when your blood sugar is low?  Yes    What do you do if your blood sugar is low?  eat  something, drink juice or milk    Number of hyperglycemic episodes per week  0    Have you had a dilated eye exam in the past 12 months?  Yes    Have you had a dental exam in the past 12 months?  Yes    Are you checking your feet?  Yes    How many days per week are you checking your feet?  7      Dietary Intake   Breakfast  cereal (frosted flakes), 2% milk OR 2 slices white toast, butter OR donuts 9    Snack (morning)  occasional saltines    Lunch  NABS or sandwich or yogurt and granola or leftovers 12-1    Snack (afternoon)  occasional NABS or peanuts    Dinner  ravioli OR beans, greens, sour cream cornbread OR 2 vegetables, meat OR grilled cheese OR pizza 5:30-6:45    Snack (evening)  ice  cream or fruit or peanuts or chips    Beverage(s)  water, black coffee, sweet tea (1 T sugar per cup)      Exercise   Exercise Type  ADL's;Light (walking / raking leaves) walks in warm weather, gardening      Patient Education   Previous Diabetes Education  Yes (please comment) attended DM classes with husband    Disease state   Definition of diabetes, type 1 and 2, and the diagnosis of diabetes    Nutrition management   Role of diet in the treatment of diabetes and the relationship between the three main macronutrients and blood glucose level;Meal options for control of blood glucose level and chronic complications.;Food label reading, portion sizes and measuring food.    Physical activity and exercise   Role of exercise on diabetes management, blood pressure control and cardiac health.;Helped patient identify appropriate exercises in relation to his/her diabetes, diabetes complications and other health issue.    Medications  Reviewed patients medication for diabetes, action, purpose, timing of dose and side effects.    Monitoring  Purpose and frequency of SMBG.;Identified appropriate SMBG and/or A1C goals.;Daily foot exams;Yearly dilated eye exam    Acute complications  Taught treatment of hypoglycemia - the 15 rule.    Chronic complications  Relationship between chronic complications and blood glucose control    Psychosocial adjustment  Worked with patient to identify barriers to care and solutions;Role of stress on diabetes;Identified and addressed patients feelings and concerns about diabetes    Personal strategies to promote health  Lifestyle issues that need to be addressed for better diabetes care      Individualized Goals (developed by patient)   Nutrition  General guidelines for healthy choices and portions discussed    Physical Activity  Exercise 3-5 times per week;30 minutes per day    Medications  take my medication as prescribed    Monitoring   test my blood glucose as discussed     Reducing Risk  examine blood glucose patterns    Health Coping  discuss diabetes with (comment) MD,RD,CDE      Post-Education Assessment   Patient understands the diabetes disease and treatment process.  Demonstrates understanding / competency    Patient understands incorporating nutritional management into lifestyle.  Demonstrates understanding / competency    Patient undertands incorporating physical activity into lifestyle.  Demonstrates understanding / competency    Patient understands using medications safely.  Demonstrates understanding / competency    Patient understands monitoring blood glucose, interpreting and using results  Demonstrates understanding /  competency    Patient understands prevention, detection, and treatment of acute complications.  Demonstrates understanding / competency    Patient understands prevention, detection, and treatment of chronic complications.  Demonstrates understanding / competency    Patient understands how to develop strategies to address psychosocial issues.  Demonstrates understanding / competency    Patient understands how to develop strategies to promote health/change behavior.  Demonstrates understanding / competency      Outcomes   Expected Outcomes  Demonstrated interest in learning. Expect positive outcomes    Future DMSE  PRN    Program Status  Completed       Individualized Plan for Diabetes Self-Management Training:   Learning Objective:  Patient will have a greater understanding of diabetes self-management. Patient education plan is to attend individual and/or group sessions per assessed needs and concerns.   Plan:   Patient Instructions  Consider unsweetened tea rather than sweet. Stay active even in the winter. (30 minutes) Small amounts of protein with all of your meals. Increase your vegetable intake. Consider meal planning ("my plate")  Aim for 3 Carb Choices per meal (45 grams) +/- 1 either way  Aim for 0-1 Carbs per  snack if hungry  Include protein in moderation with your meals and snacks Consider reading food labels for Total Carbohydrate and Fat Grams of foods Continue checking BG at alternate times per day as directed by MD  Continue taking medication as directed by MD       Expected Outcomes:  Demonstrated interest in learning. Expect positive outcomes  Education material provided: Living Well with Diabetes, Food label handouts, A1C conversion sheet, Meal plan card, My Plate and Snack sheet  If problems or questions, patient to contact team via:  Phone  Future DSME appointment: PRN

## 2017-06-01 NOTE — Addendum Note (Signed)
Addended by: Carlus PavlovGHERGHE, Milka Windholz on: 06/01/2017 05:25 PM   Modules accepted: Orders

## 2017-06-04 ENCOUNTER — Telehealth: Payer: Self-pay | Admitting: Internal Medicine

## 2017-06-04 NOTE — Telephone Encounter (Signed)
Patient returning call-thinks it re: radiology results-she is unable to get messages off of her phone. Please call patient at ph# [phone]

## 2017-06-05 ENCOUNTER — Other Ambulatory Visit: Payer: Medicare Other

## 2017-06-05 NOTE — Telephone Encounter (Signed)
Called patient states spoke to someone in office yesterday and received results.

## 2017-06-12 NOTE — Telephone Encounter (Signed)
Pt was given number to them and has not been able to get through, will try again this afternoon.

## 2017-06-12 NOTE — Telephone Encounter (Signed)
Patient stated that she has an appointment tomorrow with GI, she has been calling to cancel, and no one is answering, patient is asking you to call her maybe she is calling the wrong number. please advise

## 2017-06-13 ENCOUNTER — Other Ambulatory Visit: Payer: Medicare Other

## 2017-06-19 ENCOUNTER — Other Ambulatory Visit: Payer: Self-pay | Admitting: Family Medicine

## 2017-06-21 ENCOUNTER — Ambulatory Visit
Admission: RE | Admit: 2017-06-21 | Discharge: 2017-06-21 | Disposition: A | Discharge status: Home / Self Care | Payer: Medicare Other | Source: Ambulatory Visit | Attending: Internal Medicine | Admitting: Internal Medicine

## 2017-06-25 ENCOUNTER — Encounter: Payer: Self-pay | Admitting: Family Medicine

## 2017-06-25 ENCOUNTER — Ambulatory Visit: Payer: Medicare Other | Admitting: Family Medicine

## 2017-06-25 VITALS — BP 122/68 | HR 62 | Temp 97.9°F | Wt 176.2 lb

## 2017-06-25 DIAGNOSIS — M542 Cervicalgia: Secondary | ICD-10-CM

## 2017-06-25 DIAGNOSIS — E1159 Type 2 diabetes mellitus with other circulatory complications: Secondary | ICD-10-CM

## 2017-06-25 DIAGNOSIS — E042 Nontoxic multinodular goiter: Secondary | ICD-10-CM | POA: Diagnosis not present

## 2017-06-25 DIAGNOSIS — K219 Gastro-esophageal reflux disease without esophagitis: Secondary | ICD-10-CM

## 2017-06-25 MED ORDER — GLYBURIDE 5 MG PO TABS: 5.0000 mg | ORAL_TABLET | Freq: Every day | ORAL | 5 refills | Status: DC

## 2017-06-25 MED ORDER — TRAMADOL HCL 50 MG PO TABS: 50.0000 mg | ORAL_TABLET | Freq: Four times a day (QID) | ORAL | 2 refills | Status: DC | PRN

## 2017-06-25 MED ORDER — OMEPRAZOLE 20 MG PO CPDR: 20.0000 mg | DELAYED_RELEASE_CAPSULE | Freq: Two times a day (BID) | ORAL | 5 refills | Status: DC

## 2017-06-25 NOTE — Progress Notes (Signed)
She uses celebrex prn for neck pain but not with ibuprofen.  She uses tramadol prn w/o ADE.    Thyroid nodule was benign.  She had imaging done with  1. Small sliding hiatal hernia with moderate gastroesophageal reflux. 2. Barium pill passes into the stomach without delay.  She has seen endo prev.    She had prev steroid shot that elevated her sugar.  She was prev able to tolerate glyburide, she had used that with titration after prev sugar elevations with injections.  She couldn't tolerate glipizide.  See allergy list.    Her sugar is still not well controlled.  She is gaining weight on tresiba.  She wanted to get back to glyburide.     She is back on glyburide and sugar is back to 150s now.  She isn't having lows.    She had trouble swallowing the 40mg  omeprazole pills due to size.  She asked about changing the dose back to 20 mg twice a day because she could tolerate that better.  This is reasonable.  Meds, vitals, and allergies reviewed.   ROS: Per HPI unless specifically indicated in ROS section   GEN: nad, alert and oriented HEENT: mucous membranes moist NECK: supple w/o LA CV: rrr. PULM: ctab, no inc wob ABD: soft, +bs EXT: no edema SKIN: no acute rash

## 2017-06-25 NOTE — Patient Instructions (Addendum)
Restart glyburide 1 pill a day initially.  Then go up to 1.5-2 pills in a day, with the dose split between AM and PM.   Gradually cut back on tresiba by 1 unit a day.  When you get down to 5 units, then stop.   Update me in about 1 week, sooner if needed.  Take care.  Glad to see you.

## 2017-06-26 NOTE — Assessment & Plan Note (Signed)
She had trouble swallowing the 40 mg of omeprazole pill, okay to change back to 20 mg twice a day.  Update me as needed.  She agrees.

## 2017-06-26 NOTE — Assessment & Plan Note (Signed)
Fortunately her pathology was benign.  Discussed with patient.

## 2017-06-26 NOTE — Assessment & Plan Note (Signed)
We talked about tapering up on the glyburide and down on the tresiba.  See after visit summary.  I will update endocrinology in the meantime.  At this point still okay for outpatient follow-up.  See above. >25 minutes spent in face to face time with patient, >50% spent in counselling or coordination of care.  Routine cautions given on glyburide, to avoid hypoglycemia.

## 2017-06-26 NOTE — Assessment & Plan Note (Signed)
Okay to use Celebrex as needed but not with ibuprofen.  She takes tramadol as needed for neck pain.  No adverse effect of medications.  Continue as is.

## 2017-07-18 ENCOUNTER — Encounter: Payer: Self-pay | Admitting: Internal Medicine

## 2017-07-18 ENCOUNTER — Ambulatory Visit: Payer: Medicare Other | Admitting: Internal Medicine

## 2017-07-18 VITALS — BP 126/72 | HR 65 | Ht 66.0 in | Wt 178.4 lb

## 2017-07-18 DIAGNOSIS — E1159 Type 2 diabetes mellitus with other circulatory complications: Secondary | ICD-10-CM

## 2017-07-18 DIAGNOSIS — E042 Nontoxic multinodular goiter: Secondary | ICD-10-CM

## 2017-07-18 LAB — POCT GLUCOSE (DEVICE FOR HOME USE): POC Glucose: 255 mg/dl — AB (ref 70–99)

## 2017-07-18 MED ORDER — INSULIN DEGLUDEC 100 UNIT/ML ~~LOC~~ SOPN: 14.0000 [IU] | PEN_INJECTOR | Freq: Every day | SUBCUTANEOUS | 11 refills | Status: DC

## 2017-07-18 NOTE — Patient Instructions (Signed)
Please continue: - Glyburide 5 mg 2x a day before meals  Add back: - Tresiba 14 units daily. Please increase the dose by 4 units every 4 days until sugars in am <140 or you reach 36 units.  Please check with the pharmacy or your insurance if the following medicines are covered: - Trulicity (once a week) - Ozempic (once a week) - Bydureon (once a week) - Victoza (once a day)  Please return in 1.5 months with your sugar log.

## 2017-07-18 NOTE — Progress Notes (Signed)
Patient ID: Maria [name], female   DOB: Apr 14, 1941, 77 y.o.   MRN: [id number]   HPI: Maria [name] is a 77 y.o.-year-old female, returning for f/u for thyroid nodules and DM2, dx in 1990s, insulin-dependent, uncontrolled, without long-term complications. Last visit 2 mo ago.  Her sugars started to worsen after she had 2 steroid injections in shoulder in 03/2017: Sugars in the 500s, then decreased to 200s-300s.  At last visit, we tried to add [place], as she had intolerance to Levemir and Lantus.    She developed nodules under the skin and weigh gain 2/2 Tresiba >> PCP advised her to taper and stop the insulin.  We also changed from glyburide to glipizide XL to avoid hypoglycemia, but she could not tolerate this due to abdominal pain and diarrhea so she is now back on glyburide.  Last hemoglobin A1c was: Lab Results  Component Value Date   HGBA1C 9.9 05/18/2017   HGBA1C 8.3 (H) 11/10/2016   HGBA1C 6.7 (H) 07/05/2016   Pt is on a regimen of: - Glyburide 5 mg 2x a day - Tresiba 10 units daily - added 05/2017 >> increased to 20 >> decreased to dose to off (stopped 1.5 weeks ago ) She has multiple medication intolerances, including to metformin (GI upset), Lantus and Levemir (rash)  Pt checks her sugars 2x  a day and they are: - am: 300s now >> on insulin: 126-244, now: 200s - 2h after b'fast: n/c - before lunch: n/c - 2h after lunch: n/c - before dinner: n/c - 2h after dinner: 400-450 >> high 200s-300s - bedtime: n/c - nighttime: n/c Lowest sugar was 88 >> 126; she has hypoglycemia awareness in the 80s.  Highest sugar was 500s >> 300s.  Glucometer: AccuChek  Pt's meals are: - Breakfast: cereal + 2% milk - Lunch: sandwich + soup - Dinner: PB sandwich +/- jelly; grilled cheese; out during weekends - Snacks: 0-1: yoghurt Dances on Sat night.   -No CKD, last BUN/creatinine:  Lab Results  Component Value Date   BUN 12 02/22/2017   BUN 37 (H) 11/10/2016   CREATININE 0.80  02/22/2017   CREATININE 1.21 (H) 11/10/2016  On losartan. -+ HL; last set of lipids: Lab Results  Component Value Date   CHOL 106 10/26/2016   HDL 45 10/26/2016   LDLCALC 43 10/26/2016   LDLDIRECT 168.1 02/18/2010   TRIG 88 10/26/2016   CHOLHDL 2.4 10/26/2016   - last eye exam was in 07/2016 8.  No DR -No numbness and tingling in her feet. On ASA 81  Nontoxic MNG: - dx'ed "years ago" - Thyroid U/S (02/28/2017): 2.6 cm isoechoic nodule, stable since 2007   The L nodule was Bx'd in 08/2005 >> Satisfactory but limited for evaluation INTERPRETATION(S): A FEW SMALL GROUPS OF FOLLICULAR EPITHELIAL CELLS ADMIXED WITH COLLOID  Repeat biopsy in 05/31/2017: Benign  Barium swallow (06/21/2017): Hiatal hernia and moderate GERD, no external compression on the esophagus  Pt denies: - feeling nodules in neck - choking - SOB with lying down  However, she does have dysphagia and feels strangled >> coughing.  She also has hoarseness.  TSH level normal at last check: Lab Results  Component Value Date   TSH 2.98 07/05/2016   ROS: Constitutional: no weight gain/no weight loss, no fatigue, no subjective hyperthermia, no subjective hypothermia Eyes: no blurry vision, no xerophthalmia ENT: no sore throat, no nodules palpated in throat, no dysphagia, no odynophagia, no hoarseness Cardiovascular: no CP/no SOB/no palpitations/no leg swelling Respiratory:  no cough/no SOB/no wheezing Gastrointestinal: no N/no V/no D/no C/no acid reflux Musculoskeletal: no muscle aches/no joint aches Skin: no rashes, no hair loss Neurological: no tremors/no numbness/no tingling/no dizziness  I reviewed pt's medications, allergies, PMH, social hx, family hx, and changes were documented in the history of present illness. Otherwise, unchanged from my initial visit note.  Past Medical History:  Diagnosis Date  . Anemia 05/1985  . Carotid artery disease (HCC)    a. 02/2015 - 1-39% bilaterally.  . Chronic back  pain   . Concussion   . Coronary artery disease    a. s/p DES to [facility] 01/2015.  . DDD (degenerative disc disease), cervical   . DDD (degenerative disc disease), lumbar   . Fracture of lower leg 07/2001   Right  . GERD (gastroesophageal reflux disease)   . Hyperlipidemia   . Hypertension   . Multinodular thyroid   . Pneumonia ?1990's X 1  . PSVT (paroxysmal supraventricular tachycardia) (HCC)    a. s/p catheter ablation of a parahisian atrial tachycardia which was successfully ablated from the non-coronary cusp of the aortic root 06/2014.  [name] Renal cyst 07/10/2006   bilateral  . Right knee pain 02/2010   Injected - Dr. Shelle Iron  . Sinus pause    a. By event monitoring 09/2014 - BB stopped at that time.  . Syncope and collapse 07/2001   "that's when I broke my leg"  . Type II diabetes mellitus (HCC) 2002   Past Surgical History:  Procedure Laterality Date  . ANTERIOR CERVICAL DECOMP/DISCECTOMY FUSION  10/03/2005   C4/5; C5/6  "it's got a plate in there"  . BACK SURGERY    . BREAST BIOPSY Bilateral 1968 (multiple)   "all benign"  . BREAST LUMPECTOMY Bilateral 1968  . CARDIAC CATHETERIZATION N/A 11/18/2014   Procedure: Left Heart Cath and Coronary Angiography;  Surgeon: Peter M Swaziland, MD;  Location: [facility] INVASIVE CV LAB;  Service: Cardiovascular;  Laterality: N/A;  . CARDIAC CATHETERIZATION N/A 01/22/2015   Procedure: Coronary Stent Intervention;  Surgeon: Peter M Swaziland, MD;  Location: [facility] INVASIVE CV LAB;  Service: Cardiovascular;  Laterality: N/A;  . CARDIAC CATHETERIZATION N/A 06/10/2015   Procedure: Left Heart Cath and Coronary Angiography;  Surgeon: Peter M Swaziland, MD;  Location[facility] INVASIVE CV LAB;  Service: Cardiovascular;  Laterality: N/A;  . CATARACT EXTRACTION W/ INTRAOCULAR LENS  IMPLANT, BILATERAL Bilateral 12/2010  . CORONARY ANGIOPLASTY    . ESOPHAGOGASTRODUODENOSCOPY  08/14/2005   gastropathy biopsy, negative  . LAPAROSCOPIC CHOLECYSTECTOMY  08/1992  . POSTERIOR LAMINECTOMY /  DECOMPRESSION LUMBAR SPINE  09/2001   due to herniated disc  . SUPRAVENTRICULAR TACHYCARDIA ABLATION N/A 07/06/2014   Procedure: SUPRAVENTRICULAR TACHYCARDIA ABLATION;  Surgeon: Marinus Maw, MD;  Location: [facility] CATH LAB;  Service: Cardiovascular;  Laterality: N/A;  . VAGINAL HYSTERECTOMY  1968   spotting   Social History   Socioeconomic History  . Marital status: Married    Spouse name: Not on file  . Number of children: 2  . Years of education: Not on file  . Highest education level: Not on file  Social Needs  . Financial resource strain: Not on file  . Food insecurity - worry: Not on file  . Food insecurity - inability: Not on file  . Transportation needs - medical: Not on file  . Transportation needs - non-medical: Not on file  Occupational History  . Occupation: Retired; Hotel manager, [employer]    Employer: RETIRED    Comment:  Works a couple days a week  Tobacco Use  . Smoking status: Never Smoker  . Smokeless tobacco: Never Used  Substance and Sexual Activity  . Alcohol use: No    Alcohol/week: 0.0 oz  . Drug use: No  . Sexual activity: Yes  Other Topics Concern  . Not on file  Social History Narrative   Widowed 2011 after 52 years of marriage (husband had C.diff)   Remarried 2015   Current Outpatient Medications on File Prior to Visit  Medication Sig Dispense Refill  . aspirin EC 81 MG tablet Take 81 mg by mouth once.     [name]. atenolol (TENORMIN) 25 MG tablet TAKE 1/2 TABLET (12.5 MG TOTAL) BY MOUTH DAILY. 45 tablet 1  . celecoxib (CELEBREX) 200 MG capsule TAKE 1 CAPSULE BY MOUTH DAILY AS NEEDED. 30 capsule 5  . glucose blood (ONE TOUCH ULTRA TEST) test strip Use as instructed to test blood sugar three times daily. 300 each 1  . glyBURIDE (DIABETA) 5 MG tablet Take 1-2 tablets (5-10 mg total) by mouth daily with breakfast. 60 tablet 5  . ibuprofen (ADVIL,MOTRIN) 200 MG tablet Take 400 mg by mouth every 8 (eight) hours as needed for mild pain or  moderate pain.    [name]. insulin degludec (TRESIBA FLEXTOUCH) 100 UNIT/ML SOPN FlexTouch Pen Inject 0.2 mLs (20 Units total) into the skin daily at 10 pm. 5 pen 3  . Insulin Pen Needle (PEN NEEDLES) 32G X 4 MM MISC 1 each by Does not apply route daily. 100 each 3  . loperamide (IMODIUM) 2 MG capsule Take 1 mg by mouth as needed for diarrhea or loose stools.    [name]. losartan (COZAAR) 25 MG tablet Take 1 tablet (25 mg total) by mouth at bedtime.    . nitroGLYCERIN (NITROSTAT) 0.4 MG SL tablet Place 0.4 mg under the tongue every 5 (five) minutes as needed for chest pain (no more than 3 doses).    [name]. omeprazole (PRILOSEC) 20 MG capsule Take 1 capsule (20 mg total) by mouth 2 (two) times daily. 60 capsule 5  . traMADol (ULTRAM) 50 MG tablet Take 1 tablet (50 mg total) by mouth every 6 (six) hours as needed for moderate pain (sedation caution). 60 tablet 2   No current facility-administered medications on file prior to visit.    Allergies  Allergen Reactions  . Acetaminophen     REACTION: jittery with plain tylenol, but tolerated tylenol/benadryl combination  . Carvedilol     REACTION: nightmares  . Crestor [Rosuvastatin]     aches  . Ezetimibe-Simvastatin     REACTION: muscle aches  . Glipizide Other (See Comments)    GI intolerance.    . Ibuprofen Other (See Comments)    REACTION: GI upset at high doses  . Insulin Glargine     Rash  . Levemir [Insulin Detemir]     rash  . Macrobid [Nitrofurantoin Macrocrystal] Other (See Comments)    Achey, N & V  . Metformin     REACTION: GI upset  . Metoprolol Tartrate     REACTION: nightmares  . Naproxen     REACTION: GI upset  . Pravastatin Other (See Comments)    Myalgias   Family History  Problem Relation Age of Onset  . Hypertension Mother   . Heart disease Mother   . Diabetes Sister   . Myasthenia gravis Sister   . Diabetes Brother        1/2 juvenile; foot ulcer; periph. neuropathy  .  Heart disease Maternal Grandfather        MI  .  Hypertension Maternal Grandfather   . Depression Paternal Aunt   . Hypertension Maternal Grandmother   . Diabetes Maternal Grandmother   . Stroke Maternal Grandmother   . Hypertension Paternal Grandmother   . Diabetes Paternal Grandmother   . Hypertension Paternal Grandfather   . Diabetes Paternal Grandfather   . Breast cancer Maternal Aunt   . Colon cancer Neg Hx     PE: BP 126/72   Pulse 65   Ht 5\' 6"  (1.676 m)   Wt 178 lb 6.4 oz (80.9 kg)   SpO2 95%   BMI 28.79 kg/m  Wt Readings from Last 3 Encounters:  07/18/17 178 lb 6.4 oz (80.9 kg)  06/25/17 176 lb 4 oz (79.9 kg)  05/31/17 170 lb (77.1 kg)   Constitutional: overweight, in NAD Eyes: PERRLA, EOMI, no exophthalmos ENT: moist mucous membranes, no thyromegaly, no cervical lymphadenopathy Cardiovascular: RRR, No MRG Respiratory: CTA B Gastrointestinal: abdomen soft, NT, ND, BS+ Musculoskeletal: no deformities, strength intact in all 4 Skin: moist, warm, no rashes Neurological: no tremor with outstretched hands, DTR normal in all 4  ASSESSMENT: 1. DM2, insulin-dependent, uncontrolled, without long-term complications, but with hyperglycemia  2. Thyroid nodules  No orders of the defined types were placed in this encounter.  Adequacy Reason Satisfactory For Evaluation. Diagnosis THYROID, FINE NEEDLE ASPIRATION LEFTS, LEFT MID LOBE (SPECIMEN 1 OF 1 COLLECTED 05/31/2017) CONSISTENT WITH BENIGN FOLLICULAR NODULE (B[place] CATEGORY II). Jimmy Picket MD Pathologist, Electronic Signature (Case signed 06/01/2017) Specimen Clinical Information Left Mid, 2.6cm; Other 2 dimensions: 1.4 x 1.8 cm, previously 2.5 x 1.4 x 1.7cm solid/almost completely solid isoechoic TI-RADS - 3, Mildly suspicious nodule Source Thyroid, Fine Needle Aspiration, Left, LMP (Specimen 1 of 1, collected on 05/31/17)  Thyroid nodule is benign.  States she has some neck compression symptoms, I will order a barium swallow to see if the compression is  external or internal.  DG Esophagus  Order: [id number]  Status:  Final result Visible to patient:  No (Not Released) Dx:  Multiple thyroid nodules  Details   Reading Physician Reading Date Result Priority  Dwyane Dee, MD 06/21/2017     Narrative    CLINICAL DATA: Dysphagia  EXAM: ESOPHOGRAM / BARIUM SWALLOW / BARIUM TABLET STUDY  TECHNIQUE: Combined double contrast and single contrast examination performed using effervescent crystals, thick barium liquid, and thin barium liquid. The patient was observed with fluoroscopy swallowing a 13 mm barium sulphate tablet.  FLUOROSCOPY TIME: Fluoroscopy Time: 1 minute 18 seconds  Radiation Exposure Index (if provided by the fluoroscopic device): 61 mGy  Number of Acquired Spot Images: 0  COMPARISON: None.  FINDINGS: Initially rapid sequence spot films of the cervical esophagus were performed. Swallowing mechanism is unremarkable with no penetration or aspiration noted. Anterior fusion plate is present from C4 to C6. A double-contrast barium swallow shows the mucosa of the esophagus to be unremarkable. The gastroesophageal junction is noted to be somewhat patulous. There does appear to be a small sliding hiatal hernia present. Moderate gastroesophageal reflux is demonstrated. A barium pill was given at the end of the study which passed into the stomach without delay.  IMPRESSION: 1. Small sliding hiatal hernia with moderate gastroesophageal reflux. 2. Barium pill passes into the stomach without delay.   Electronically Signed By: Dwyane Dee M.D. On: 06/21/2017 10:47       Normal barium swallow, without external compression of the thyroid on the esophagus.  She does have moderate GERD.  PLAN:  1. Patient with long-standing, uncontrolled, diabetes, on oral antidiabetic regimen which was insufficient (HbA1c 9.9% at last visit, which sugars in the 200s-300s throughout the day) and therefore, we added basal insulin at  last visit.  We used [place] since she had intolerance to Levemir and Lantus (rash) I also advised her to switch from glyburide to glipizide ER, however, she developed Abdominal pain and diarrhea.  PCP restarted glyburide.  She also developed subcutaneous nodules with insulin, and she called [name] about this and I explained that she needs to change the needle every time, otherwise, and the insulin would cause nodules.  She was also complaining of weight gain which was see by and I explained that this happens with every insulin.  She was advised by PCP to decrease the dose and then discontinue the insulin.  She stopped it 1.5 months ago. - She initially had a good response to insulin, with greatly improved a.m. sugars and improvement in the sugars at bedtime, however, these still remained high.  I suggested to restart insulin, with one option being to start NPH, intermediate acting insulin, twice a day.  However, since I clarified that all insulins caused weight gain, she agrees to restart back on the Tresiba, especially as she admits that her sugars did improve significantly while on it. Today, HbA1c is 8.6% (better) - this was actually checked by mistake. - Therefore, will restart Maria [name] and for now continue glyburide, but we did discuss about the possibility of adding a GLP-1 receptor agonist at next visit.  I explained that this helps [name] with after meal sugars and also limiting the weight gain from the insulin  - I would also want to check her for type 1 diabetes.  - At last visit, I referred her to nutrition, as she saw nutritionist 05/2017.  At today's visit, CBG was: 255, too high to check a C-peptide.  We will check these labs at next visit. - I suggested to:  Patient Instructions  Please continue: - Glyburide 5 mg 2x a day before meals  Add back: - Tresiba 14 units daily. Please increase the dose by 4 units every 4 days until sugars in am <140 or you reach 36 units.  Please check with the  pharmacy or your insurance if the following medicines are covered: - Trulicity (once a week) - Ozempic (once a week) - Bydureon (once a week) - Victoza (once a day)  Please return in 1.5 months with your sugar log.   - continue checking sugars at different times of the day - check 1x a day, rotating checks - advised for yearly eye exams >> she is UTD - Return to clinic in 1.5 mo with sugar log   2. Thyroid nodules - Reviewed the report of her thyroid ultrasound and also the images.  The dominant nodule is large but without other concerning feature.  She does not have a family history of thyroid cancer or personal history of radiation therapy to head or neck.  She had a previous biopsy before of her left nodule (2007) but this yielded insufficient sample.  We repeated the biopsy of her left 2.6 cm thyroid nodule on 05/31/2017 and this was benign.  We discussed that with a history of a benign biopsy, the future chances of cancer are very small. - She has some neck compression symptoms, However, we checked a barium swallow on 06/21/2017, and this showed a small sliding hiatal hernia, with moderate  GERD, but without external compression on the esophagus. - We will continue to manage her thyroid nodules expectantly for now.  She agrees.  Carlus Pavlov, MD PhD [facility]

## 2017-07-26 ENCOUNTER — Other Ambulatory Visit: Payer: Self-pay | Admitting: Nurse Practitioner

## 2017-08-02 ENCOUNTER — Other Ambulatory Visit: Payer: Self-pay | Admitting: Physician Assistant

## 2017-08-15 LAB — HM DIABETES EYE EXAM

## 2017-08-21 ENCOUNTER — Encounter: Payer: Self-pay | Admitting: Family Medicine

## 2017-09-10 ENCOUNTER — Ambulatory Visit: Payer: Medicare Other | Admitting: Internal Medicine

## 2017-10-01 ENCOUNTER — Encounter: Payer: Self-pay | Admitting: Family Medicine

## 2017-10-01 ENCOUNTER — Ambulatory Visit: Payer: Medicare Other | Admitting: Family Medicine

## 2017-10-01 VITALS — BP 128/72 | HR 80 | Temp 98.5°F | Ht 66.0 in | Wt 177.2 lb

## 2017-10-01 DIAGNOSIS — M5431 Sciatica, right side: Secondary | ICD-10-CM | POA: Diagnosis not present

## 2017-10-01 MED ORDER — GABAPENTIN 100 MG PO CAPS: 100.0000 mg | ORAL_CAPSULE | Freq: Three times a day (TID) | ORAL | 3 refills | Status: DC

## 2017-10-01 NOTE — Patient Instructions (Signed)
We will call about your referral.  Shirlee Limerick or Alvina Chou will call you if you don't see one of them on the way out.  Check up front about the referral.   Start taking gabapentin for the pain and gradually increase the dose.  Update me as needed.

## 2017-10-01 NOTE — Progress Notes (Signed)
R leg pain.  Started with R lateral/anterior shin pain with walking.  Started about 3-4 weeks ago.  Then she had R buttock, R lower back, R foot pain.  R lower back is ttp.  No L sided.  No falls, no injury.  No rash.  No bruising.  No trauma.  The pain isn't limited to a joint.  "Shocking" pain in the R buttock.   She has been taking tramadol and lower dose of ibuprofen for pain.    Meds, vitals, and allergies reviewed.   ROS: Per HPI unless specifically indicated in ROS section   nad ncat Neck supple rrr ctab abd soft Back not ttp in midline No rash R SLR positive.  S/S grossly wnl BLE Able to bear weight.

## 2017-10-02 DIAGNOSIS — M5431 Sciatica, right side: Secondary | ICD-10-CM | POA: Insufficient documentation

## 2017-10-02 NOTE — Assessment & Plan Note (Signed)
Okay for outpatient follow-up.  Imaging would likely not change the plan at this point.  Refer to physical therapy.  Would avoid prednisone given her history of diabetes.  Reasonable to try gabapentin 100 to 200 mg up to 3 times a day.  Start with 100 mg/day and gradually uptitrate.  Routine cautions given.  Rationale for medication and treatment and anatom discussed with patient.  She agrees.  She will update me as needed.

## 2017-10-11 ENCOUNTER — Encounter: Payer: Self-pay | Admitting: Family Medicine

## 2017-10-11 ENCOUNTER — Ambulatory Visit: Payer: Medicare Other | Admitting: Family Medicine

## 2017-10-11 DIAGNOSIS — M5431 Sciatica, right side: Secondary | ICD-10-CM | POA: Diagnosis not present

## 2017-10-11 NOTE — Progress Notes (Signed)
Sugar was 103 this AM.  109 recently.  She is cautious to avoid low sugars.   Prev note d/w pt.  She went to PT and has home exercises to do.  She went to [facility] and had much more pain (300 miles one way, pain got worse on the way).  She had to use a wheelchair due to pain with walking while there.  They came back this week and she was getting some better.  She didn't knee her cane yesterday and she is clearly better today.  No pain now.  Still on gabapentin.    Similar sx to prev, but just worse.  No L sided sx.  She didn't tolerate gabapentin from diarrhea.    Meds, vitals, and allergies reviewed.   ROS: Per HPI unless specifically indicated in ROS section   GEN: nad, alert and oriented NECK: supple w/o LA CV: rrr PULM: ctab, no inc wob ABD: soft, +bs EXT: no edema SKIN: no acute rash  SLR neg.  No S/S BLE.  Normal plantar and dorsiflexion B feet.   Diabetic foot exam: Normal inspection No skin breakdown No calluses  Normal DP pulses Normal sensation to light touch and monofilament Nails normal

## 2017-10-11 NOTE — Assessment & Plan Note (Signed)
At risk for recurrent sx with prev MRI noted: 1. Moderate spinal canal stenosis at L2-L3 due to combination of disc bulge and facet arthrosis. 2. Severe right neural foraminal stenosis at L4-L5. 3. Moderate left neural foraminal stenosis at L1-2. 4. Remote left hemilaminectomy at L3-L4.  D/w pt about continued exercise/PT since she is better now.  She can do exercises at home.  If worse, we can re-image and refer back to neurosurgery.  She agrees.  Fortunately she is clearly better now and I would defer other interventions for now.  She agrees.  Rationale d/w pt.

## 2017-10-11 NOTE — Patient Instructions (Signed)
If you have more trouble in spite of working on the exercises then let me know and we can set you up with neurosurgery and likely get another MRI.  Take care.  Glad to see you.  Glad you are better today.

## 2017-10-18 ENCOUNTER — Other Ambulatory Visit: Payer: Self-pay | Admitting: Primary Care

## 2017-10-29 ENCOUNTER — Ambulatory Visit: Payer: Medicare Other | Admitting: Family Medicine

## 2017-10-29 ENCOUNTER — Encounter: Payer: Self-pay | Admitting: Family Medicine

## 2017-10-29 VITALS — BP 122/68 | HR 78 | Temp 98.6°F | Ht 66.0 in | Wt 175.5 lb

## 2017-10-29 DIAGNOSIS — M5431 Sciatica, right side: Secondary | ICD-10-CM | POA: Diagnosis not present

## 2017-10-29 LAB — BASIC METABOLIC PANEL
BUN: 25 mg/dL — AB (ref 6–23)
CHLORIDE: 104 meq/L (ref 96–112)
CO2: 26 mEq/L (ref 19–32)
Calcium: 9.9 mg/dL (ref 8.4–10.5)
Creatinine, Ser: 1.42 mg/dL — ABNORMAL HIGH (ref 0.40–1.20)
GFR: 38 mL/min — ABNORMAL LOW (ref >60.00)
Glucose, Bld: 196 mg/dL — ABNORMAL HIGH (ref 70–99)
POTASSIUM: 4.5 meq/L (ref 3.5–5.1)
Sodium: 140 mEq/L (ref 135–145)

## 2017-10-29 MED ORDER — TRAMADOL HCL 50 MG PO TABS: 50.0000 mg | ORAL_TABLET | Freq: Four times a day (QID) | ORAL | 2 refills | Status: DC | PRN

## 2017-10-29 NOTE — Assessment & Plan Note (Addendum)
Recurrent, persistent, worse recently.  Continue tramadol as needed.  Will arrange for MRI.  Check basic labs in anticipation of possible contrast exposure.  Discussed.  See notes on imaging and labs.  Still okay for outpatient follow-up.  She agrees with plan.

## 2017-10-29 NOTE — Patient Instructions (Signed)
Go to the lab on the way out.  We'll contact you with your lab report. We will call about your referral.  Marion or Anastasiya will call you if you don't see one of them on the way out.  Take care.  Glad to see you.  Update me as needed.   

## 2017-10-29 NOTE — Progress Notes (Signed)
She is taking ibuprofen with up to ~4 tramadol a day and still having pain.  It helps a little.  She is having more pain today.  Rare L sided sx, and she didn't know if that was from gait changes related to R leg pain.  No FCVD.  Some nausea that could be pain related.   At risk for recurrent sx with prev MRI noted: 1. Moderate spinal canal stenosis at L2-L3 due to combination of disc bulge and facet arthrosis. 2. Severe right neural foraminal stenosis at L4-L5. 3. Moderate left neural foraminal stenosis at L1-2. 4. Remote left hemilaminectomy at L3-L4.  Meds, vitals, and allergies reviewed.   ROS: Per HPI unless specifically indicated in ROS section   nad ncat rrr ctab abd soft, not ttp Ext w/o edema.  R SLR positive.  S/S grossly wnl BLE

## 2017-10-30 ENCOUNTER — Telehealth: Payer: Self-pay | Admitting: Family Medicine

## 2017-10-30 ENCOUNTER — Other Ambulatory Visit: Payer: Self-pay | Admitting: Cardiovascular Disease

## 2017-10-30 MED ORDER — OXYCODONE HCL 5 MG PO TABS: 2.5000 mg | ORAL_TABLET | Freq: Four times a day (QID) | ORAL | 0 refills | Status: DC | PRN

## 2017-10-30 NOTE — Telephone Encounter (Signed)
Copied from CRM [phone]#[id number]. Topic: Quick Communication - See Telephone Encounter >> Oct 30, 2017 10:43 AM Windy KalataMichael, Denia Mcvicar L, NT wrote: CRM for notification. See Telephone encounter for: 10/30/17.  Patient is calling and was seen 10/29/17 for back pain, she was prescribed traMADol (ULTRAM) 50 MG tablet. Patient states that is not touching per pain and would like something else called in due to her MRI being scheduled for Sunday 11/04/17. Please advise.   [facility] [place] [address] [place] [id number] Phone: [phone] Fax: [phone]

## 2017-10-30 NOTE — Telephone Encounter (Signed)
Also  database checked. Appropriate.

## 2017-10-30 NOTE — Telephone Encounter (Addendum)
Stop the tramadol.  Try oxycodone with sedation caution.  rx sent.  Thanks.

## 2017-10-30 NOTE — Telephone Encounter (Signed)
Patient advised.

## 2017-10-30 NOTE — Addendum Note (Signed)
Addended by: Joaquim NamUNCAN, Sheneika Walstad S on: 10/30/2017 01:59 PM   Modules accepted: Orders

## 2017-10-31 ENCOUNTER — Other Ambulatory Visit: Payer: Self-pay | Admitting: Family Medicine

## 2017-10-31 DIAGNOSIS — M5431 Sciatica, right side: Secondary | ICD-10-CM

## 2017-11-04 ENCOUNTER — Ambulatory Visit
Admission: RE | Admit: 2017-11-04 | Discharge: 2017-11-04 | Disposition: A | Discharge status: Home / Self Care | Payer: Medicare Other | Source: Ambulatory Visit | Attending: Family Medicine | Admitting: Family Medicine

## 2017-11-04 ENCOUNTER — Other Ambulatory Visit: Payer: Medicare Other

## 2017-11-04 DIAGNOSIS — M5431 Sciatica, right side: Secondary | ICD-10-CM

## 2017-11-08 ENCOUNTER — Other Ambulatory Visit: Payer: Self-pay | Admitting: Family Medicine

## 2017-11-08 MED ORDER — OXYCODONE HCL 5 MG PO TABS: 2.5000 mg | ORAL_TABLET | Freq: Four times a day (QID) | ORAL | 0 refills | Status: DC | PRN

## 2017-11-13 ENCOUNTER — Ambulatory Visit: Payer: Medicare Other | Admitting: Family Medicine

## 2017-11-13 ENCOUNTER — Encounter: Payer: Self-pay | Admitting: Family Medicine

## 2017-11-13 VITALS — BP 112/76 | HR 69 | Temp 97.7°F | Ht 66.0 in | Wt 175.5 lb

## 2017-11-13 DIAGNOSIS — N281 Cyst of kidney, acquired: Secondary | ICD-10-CM | POA: Diagnosis not present

## 2017-11-13 DIAGNOSIS — M5431 Sciatica, right side: Secondary | ICD-10-CM

## 2017-11-13 LAB — BASIC METABOLIC PANEL
BUN: 14 mg/dL (ref 6–23)
CHLORIDE: 106 meq/L (ref 96–112)
CO2: 27 mEq/L (ref 19–32)
CREATININE: 1.01 mg/dL (ref 0.40–1.20)
Calcium: 9.3 mg/dL (ref 8.4–10.5)
GFR: 56.3 mL/min — ABNORMAL LOW (ref >60.00)
GLUCOSE: 192 mg/dL — AB (ref 70–99)
POTASSIUM: 4.3 meq/L (ref 3.5–5.1)
Sodium: 141 mEq/L (ref 135–145)

## 2017-11-13 NOTE — Assessment & Plan Note (Signed)
She'll use the tradadol for pain and oxycodone for severe pain. Routine cautions d/w pt.   She'll call the ortho clinic about the possible injection date and ask them about aspirin use.  She'll hold aspirin for now in case she has injection in the very near future.

## 2017-11-13 NOTE — Progress Notes (Signed)
Prev MRI d/w pt.  Per patient she saw Dr. Shelle IronBeane and her hip was thought not to be the problem.  Per patient Dr. Jillyn HiddenBean, he thought her back was the issue.    Renal cysts incidentally noted on MRI, d/w pt.  It would be reasonable to recheck Cr and then check u/s kidney at some point, d/w pt.  It may not be very comfortable to get u/s done at this point given her back pain, she agreed.    Oxycodone helps some with back pain, she had tried to alternate with tramadol when possible.  She is going to try to get an injection her back.  It makes sense to hold ASA until she can at least get the injection scheduled- she may be able to restart in the meantime.     Still with R sciatica pain with R lower back ttp with change in sensation on the R lateral leg.  Walking with cane.  No new sx but pain continues.    Meds, vitals, and allergies reviewed.   ROS: Per HPI unless specifically indicated in ROS section   nad ncat rrr ctab abd soft R lower back ttp with change in sensation on the R lateral leg.  Walking with cane.

## 2017-11-13 NOTE — Assessment & Plan Note (Addendum)
We can get the follow up ultrasound done when she is more comfortable.  Recheck Cr today.  See notes on labs.  She has recent ibuprofen use.  D/w pt about adequate hydration.

## 2017-11-13 NOTE — Patient Instructions (Addendum)
Use the tradadol for pain and oxycodone for severe pain.   Go to the lab on the way out.  We'll contact you with your lab report. Take care.  Glad to see you.  Call the ortho clinic about the possible injection date and ask them about aspirin use.   We can get the follow up ultrasound done when you are more comfortable.

## 2017-11-19 ENCOUNTER — Other Ambulatory Visit: Payer: Self-pay | Admitting: Family Medicine

## 2017-11-19 NOTE — Telephone Encounter (Signed)
Oxycodone 5mg  refill Last Refill:11/08/17 #30 Last OV: 11/13/17 PCP: Dr. Para Marchuncan Pharmacy: [facility] [address]

## 2017-11-19 NOTE — Telephone Encounter (Signed)
Copied from CRM [phone]. Topic: Quick Communication - Rx Refill/Question >> Nov 19, 2017 11:11 AM Mickel BaasMcGee, Quierra Silverio B, NT wrote: Medication: oxyCODONE (ROXICODONE) 5 MG immediate release tablet  Has the patient contacted their pharmacy? No. (Agent: If no, request that the patient contact the pharmacy for the refill.) (Agent: If yes, when and what did the pharmacy advise?)  Preferred Pharmacy (with phone number or street name): [facility] - [place], [place] - [address]  Agent: Please be advised that RX refills may take up to 3 business days. We ask that you follow-up with your pharmacy.

## 2017-11-19 NOTE — Telephone Encounter (Signed)
Name of Medication: Oxycodone Name of Pharmacy: [facility] Last Fill or Written Date and Quantity: 11/08/17 #30 Last Office Visit and Type: 11/13/17 Sciatica Next Office Visit and Type: None Last Controlled Substance Agreement Date: None Last LKG:MWNUDS:None

## 2017-11-20 MED ORDER — OXYCODONE HCL 5 MG PO TABS: 2.5000 mg | ORAL_TABLET | Freq: Four times a day (QID) | ORAL | 0 refills | Status: DC | PRN

## 2017-11-20 NOTE — Telephone Encounter (Signed)
Sent. This should be a temporary fill until she has f/u with spine clinic.  Thanks.

## 2017-11-21 ENCOUNTER — Encounter (HOSPITAL_COMMUNITY): Payer: Self-pay

## 2017-11-21 ENCOUNTER — Emergency Department (HOSPITAL_COMMUNITY)
Admission: EM | Admit: 2017-11-21 | Discharge: 2017-11-21 | Disposition: A | Discharge status: Home / Self Care | Payer: Medicare Other | Attending: Emergency Medicine | Admitting: Emergency Medicine

## 2017-11-21 DIAGNOSIS — I10 Essential (primary) hypertension: Secondary | ICD-10-CM | POA: Diagnosis not present

## 2017-11-21 DIAGNOSIS — I2510 Atherosclerotic heart disease of native coronary artery without angina pectoris: Secondary | ICD-10-CM | POA: Insufficient documentation

## 2017-11-21 DIAGNOSIS — M5441 Lumbago with sciatica, right side: Secondary | ICD-10-CM | POA: Insufficient documentation

## 2017-11-21 DIAGNOSIS — Z794 Long term (current) use of insulin: Secondary | ICD-10-CM | POA: Diagnosis not present

## 2017-11-21 DIAGNOSIS — E1151 Type 2 diabetes mellitus with diabetic peripheral angiopathy without gangrene: Secondary | ICD-10-CM | POA: Insufficient documentation

## 2017-11-21 DIAGNOSIS — Z79899 Other long term (current) drug therapy: Secondary | ICD-10-CM | POA: Diagnosis not present

## 2017-11-21 DIAGNOSIS — Z955 Presence of coronary angioplasty implant and graft: Secondary | ICD-10-CM | POA: Insufficient documentation

## 2017-11-21 DIAGNOSIS — M545 Low back pain: Secondary | ICD-10-CM | POA: Diagnosis present

## 2017-11-21 MED ORDER — LIDOCAINE 5 % EX PTCH: 1.0000 | MEDICATED_PATCH | CUTANEOUS | 0 refills | Status: DC

## 2017-11-21 MED ORDER — HYDROMORPHONE HCL 1 MG/ML IJ SOLN
1.0000 mg | Freq: Once | INTRAMUSCULAR | Status: AC
  Administered 2017-11-21: 1 mg via INTRAMUSCULAR
  Filled 2017-11-21: qty 1

## 2017-11-21 MED ORDER — METHOCARBAMOL 500 MG PO TABS: 500.0000 mg | ORAL_TABLET | Freq: Two times a day (BID) | ORAL | 0 refills | Status: DC | PRN

## 2017-11-21 NOTE — Discharge Instructions (Addendum)
Continue taking home medications as prescribed.  Use the muscle relaxer as needed. Have caution, as this may also make you tired and groggy.  Use lidoderm patches to help with pain.  Call your orthopedic doctor to discuss further management and treatment.  Return to the ER if you develop any new, worsening, or concerning symptoms.

## 2017-11-21 NOTE — ED Triage Notes (Signed)
Pt brought in by EMS due to having lower back pain. Pt has been having issues with bulging disk and lower back pain for past 8 weeks. Pt having 10/10 pain after taking pain meds at home. Pt denies incontinence.

## 2017-11-21 NOTE — ED Provider Notes (Signed)
[facility] Provider Note   CSN: [id number] Arrival date & time: 11/21/17  1031     History   Chief Complaint Chief Complaint  Patient presents with  . Back Pain    HPI Maryln ManuelBarbara D Bagby is a 77 y.o. female presenting for evaluation of back pain.  Pt states over the past 7 to 8 weeks, she has been having progressively worsening back pain.  She had an MRI the end of June, approximately 2 weeks ago which showed bulging disc, bone spurs, and spinal stenosis.  She has an appointment with orthopedic doctor next week for injections.  She states she is taking oxycodone every 4 hours without significant improvement of her pain, she is able to walk with a walker due to the amount of pain she is in.  She denies new fall, trauma, or injury.  She denies fever, chills, rash, loss of bowel or bladder control, history of cancer, history of IV drug use.  Pain is constant, worse with palpation and movement.  Nothing makes it better.  No radiation of the pain.  HPI  Past Medical History:  Diagnosis Date  . Anemia 05/1985  . Carotid artery disease (HCC)    a. 02/2015 - 1-39% bilaterally.  . Chronic back pain   . Concussion   . Coronary artery disease    a. s/p DES to [facility] 01/2015.  . DDD (degenerative disc disease), cervical   . DDD (degenerative disc disease), lumbar   . Fracture of lower leg 07/2001   Right  . GERD (gastroesophageal reflux disease)   . Hyperlipidemia   . Hypertension   . Multinodular thyroid   . Pneumonia ?1990's X 1  . PSVT (paroxysmal supraventricular tachycardia) (HCC)    a. s/p catheter ablation of a parahisian atrial tachycardia which was successfully ablated from the non-coronary cusp of the aortic root 06/2014.  [name]. Renal cyst 07/10/2006   bilateral  . Right knee pain 02/2010   Injected - Dr. Shelle IronBeane  . Sinus pause    a. By event monitoring 09/2014 - BB stopped at that time.  . Syncope and collapse 07/2001   "that's when I broke my  leg"  . Type II diabetes mellitus (HCC) 2002    Patient Active Problem List   Diagnosis Date Noted  . Right sided sciatica 10/02/2017  . Neck pain 01/02/2017  . Bruising 09/19/2015  . Dysuria 06/15/2015  . Carotid artery disease (HCC)   . Concussion with no loss of consciousness 02/11/2015  . Coronary atherosclerosis of native coronary artery 01/22/2015  . Angina pectoris (HCC) 01/22/2015  . Chest pain 11/18/2014  . Sinus node dysfunction (HCC) 11/02/2014  . SVT (supraventricular tachycardia) (HCC) 07/06/2014  . Benign paroxysmal positional vertigo 05/21/2014  . Weight loss 06/09/2012  . Functional diarrhea 02/01/2012  . Type 2 diabetes mellitus with vascular disease (HCC) 02/13/2011  . Shoulder pain, left 09/12/2010  . ADJUSTMENT REACTION, ADULT 05/17/2010  . KNEE PAIN, RIGHT 10/01/2009  . SYNCOPE 02/22/2009  . PSVT 11/12/2008  . GERD 11/12/2008  . RENAL CYST 11/12/2008  . Backache 11/12/2008  . Multiple thyroid nodules 08/07/2007  . Reflux esophagitis 08/07/2007  . Elevated lipids 08/21/2006  . ANEMIA-NOS 08/21/2006  . DEPRESSION 08/21/2006  . Essential hypertension 08/21/2006  . DEGENERATIVE DISC DISEASE, CERVICAL SPINE 08/21/2006  . DEGENERATIVE DISC DISEASE, LUMBAR SPINE 08/21/2006    Past Surgical History:  Procedure Laterality Date  . ANTERIOR CERVICAL DECOMP/DISCECTOMY FUSION  10/03/2005   C4/5; C5/6  "  it's got a plate in there"  . BACK SURGERY    . BREAST BIOPSY Bilateral 1968 (multiple)   "all benign"  . BREAST LUMPECTOMY Bilateral 1968  . CARDIAC CATHETERIZATION N/A 11/18/2014   Procedure: Left Heart Cath and Coronary Angiography;  Surgeon: Peter M Swaziland, MD;  Location: [facility] INVASIVE CV LAB;  Service: Cardiovascular;  Laterality: N/A;  . CARDIAC CATHETERIZATION N/A 01/22/2015   Procedure: Coronary Stent Intervention;  Surgeon: Peter M Swaziland, MD;  Location: [facility] INVASIVE CV LAB;  Service: Cardiovascular;  Laterality: N/A;  . CARDIAC CATHETERIZATION N/A 06/10/2015    Procedure: Left Heart Cath and Coronary Angiography;  Surgeon: Peter M Swaziland, MD;  Location: [facility] INVASIVE CV LAB;  Service: Cardiovascular;  Laterality: N/A;  . CATARACT EXTRACTION W/ INTRAOCULAR LENS  IMPLANT, BILATERAL Bilateral 12/2010  . CORONARY ANGIOPLASTY    . ESOPHAGOGASTRODUODENOSCOPY  08/14/2005   gastropathy biopsy, negative  . LAPAROSCOPIC CHOLECYSTECTOMY  08/1992  . POSTERIOR LAMINECTOMY / DECOMPRESSION LUMBAR SPINE  09/2001   due to herniated disc  . SUPRAVENTRICULAR TACHYCARDIA ABLATION N/A 07/06/2014   Procedure: SUPRAVENTRICULAR TACHYCARDIA ABLATION;  Surgeon: Marinus Maw, MD;  Location[facility] CATH LAB;  Service: Cardiovascular;  Laterality: N/A;  . VAGINAL HYSTERECTOMY  1968   spotting     OB History   None      Home Medications    Prior to Admission medications   Medication Sig Start Date End Date Taking? Authorizing Provider  aspirin EC 81 MG tablet Take 81 mg by mouth once.     [provider]  atenolol (TENORMIN) 25 MG tablet TAKE 1/2 TABLET (12.5 MG TOTAL) BY MOUTH DAILY. 08/02/17   Dunn, Dayna N, PA-C  glucose blood (ONE TOUCH ULTRA TEST) test strip USE AS INSTRUCTED TO TEST BLOOD SUGAR THREE TIMES DAILY. 10/18/17   Joaquim Nam, MD  glyBURIDE (DIABETA) 5 MG tablet Take 1-2 tablets (5-10 mg total) by mouth daily with breakfast. 06/25/17   Joaquim Nam, MD  insulin degludec (TRESIBA FLEXTOUCH) 100 UNIT/ML SOPN FlexTouch Pen Inject 0.14 mLs (14 Units total) into the skin daily at 10 pm. 07/18/17   Carlus Pavlov, MD  Insulin Pen Needle (PEN NEEDLES) 32G X 4 MM MISC 1 each by Does not apply route daily. 05/18/17   Carlus Pavlov, MD  lidocaine (LIDODERM) 5 % Place 1 patch onto the skin daily. Remove & Discard patch within 12 hours or as directed by MD 11/21/17   Chief Walkup, PA-C  loperamide (IMODIUM) 2 MG capsule Take 1 mg by mouth as needed for diarrhea or loose stools.    [provider]  losartan (COZAAR) 25 MG tablet Take 1  tablet (25 mg total) by mouth daily. Please make an appt with Dr. [name] for October for future refills. 10/30/17   Wendall Stade, MD  methocarbamol (ROBAXIN) 500 MG tablet Take 1 tablet (500 mg total) by mouth 2 (two) times daily as needed for muscle spasms. 11/21/17   Jaycelynn Knickerbocker, PA-C  nitroGLYCERIN (NITROSTAT) 0.4 MG SL tablet PLACE 1 TABLET (0.4 MG TOTAL) UNDER THE TONGUE EVERY 5 (FIVE) MINUTES AS NEEDED FOR CHEST PAIN. 07/26/17   Rosalio Macadamia, NP  omeprazole (PRILOSEC) 20 MG capsule Take 1 capsule (20 mg total) by mouth 2 (two) times daily. 06/25/17   Joaquim Nam, MD  oxyCODONE (ROXICODONE) 5 MG immediate release tablet Take 0.5-1 tablets (2.5-5 mg total) by mouth every 6 (six) hours as needed for severe pain. 11/20/17   Joaquim Nam,  MD  traMADol (ULTRAM) 50 MG tablet Take 1 tablet (50 mg total) by mouth every 6 (six) hours as needed for moderate pain (sedation caution). 10/29/17   Joaquim Nam, MD    Family History Family History  Problem Relation Age of Onset  . Hypertension Mother   . Heart disease Mother   . Diabetes Sister   . Myasthenia gravis Sister   . Diabetes Brother        1/2 juvenile; foot ulcer; periph. neuropathy  . Heart disease Maternal Grandfather        MI  . Hypertension Maternal Grandfather   . Depression Paternal Aunt   . Hypertension Maternal Grandmother   . Diabetes Maternal Grandmother   . Stroke Maternal Grandmother   . Hypertension Paternal Grandmother   . Diabetes Paternal Grandmother   . Hypertension Paternal Grandfather   . Diabetes Paternal Grandfather   . Breast cancer Maternal Aunt   . Colon cancer Neg Hx     Social History Social History   Tobacco Use  . Smoking status: Never Smoker  . Smokeless tobacco: Never Used  Substance Use Topics  . Alcohol use: No    Alcohol/week: 0.0 oz  . Drug use: No     Allergies   Acetaminophen; Carvedilol; Crestor [rosuvastatin]; Ezetimibe-simvastatin; Gabapentin; Glipizide;  Ibuprofen; Insulin glargine; Levemir [insulin detemir]; Macrobid [nitrofurantoin macrocrystal]; Metformin; Metoprolol tartrate; Naproxen; Nsaids; and Pravastatin   Review of Systems Review of Systems  Musculoskeletal: Positive for back pain.  All other systems reviewed and are negative.    Physical Exam Updated Vital Signs BP 135/86 (BP Location: Right Arm)   Pulse 92   Temp 98.1 F (36.7 C) (Oral)   Resp 16   Ht 5\' 6"  (1.676 m)   Wt 79.4 kg (175 lb)   SpO2 98%   BMI 28.25 kg/m   Physical Exam  Constitutional: She is oriented to person, place, and time. She appears well-developed and well-nourished. No distress.  Appears very uncomfortable due to pain.  HENT:  Head: Normocephalic and atraumatic.  Eyes: Pupils are equal, round, and reactive to light. Conjunctivae and EOM are normal.  Neck: Normal range of motion. Neck supple.  Cardiovascular: Normal rate, regular rhythm and intact distal pulses.  Pulmonary/Chest: Effort normal and breath sounds normal. No respiratory distress. She has no wheezes.  Abdominal: Soft. She exhibits no distension and no mass. There is no tenderness. There is no guarding.  Musculoskeletal: She exhibits tenderness.  Tenderness to palpation of low thoracic and lumbar back along midline spine and bilateral musculature.  Worse on the left side.  Pedal pulses intact bilaterally.  Sensation intact bilaterally.  DTRs intact bilaterally.  Patient is not able to ambulate due to pain at this time.  Neurological: She is alert and oriented to person, place, and time. No sensory deficit.  Skin: Skin is warm and dry. Capillary refill takes less than 2 seconds.  Psychiatric: She has a normal mood and affect.  Nursing note and vitals reviewed.    ED Treatments / Results  Labs (all labs ordered are listed, but only abnormal results are displayed) Labs Reviewed - No data to display  EKG None  Radiology No results found.  Procedures Procedures (including  critical care time)  Medications Ordered in ED Medications  HYDROmorphone (DILAUDID) injection 1 mg (1 mg Intramuscular Given 11/21/17 1158)  HYDROmorphone (DILAUDID) injection 1 mg (1 mg Intramuscular Given 11/21/17 1243)     Initial Impression / Assessment and Plan / ED Course  I have reviewed the triage vital signs and the nursing notes.  Pertinent labs & imaging results that were available during my care of the patient were reviewed by me and considered in my medical decision making (see chart for details).     Patient presenting for evaluation of back pain.  Physical exam shows patient is very uncomfortable.  No red flags of back pain, recent MRI showed stenosis, bulging disc, and bone spurs.  Doubt vertebral injury, infection, spinal cord compression, myelopathy, or cauda equina syndrome.  This is likely the source of her continued pain.  Will give dose of Dilaudid and reassess.  Discussed with attending, Dr. Estell Harpin agrees plan.  On reassessment, patient had mild improvement of her symptoms, though still having difficulty moving due to pain.  Will give second dose.  Patient reports of feeling of her pain with second dose.  Stressed importance of follow-up with orthopedics, recommended calling to see if she can get an earlier appointment.  Will add on muscle relaxer and lidocaine patches for patient's pain.  She is to continue to follow-up with her primary care and orthopedic doctors.  At this time, patient appears safe for discharge.  Return precautions given.  Patient states she understands and agrees to plan.  Final Clinical Impressions(s) / ED Diagnoses   Final diagnoses:  Acute midline low back pain with right-sided sciatica    ED Discharge Orders        Ordered    methocarbamol (ROBAXIN) 500 MG tablet  2 times daily PRN     11/21/17 1226    lidocaine (LIDODERM) 5 %  Every 24 hours     11/21/17 1226       Emiliana Blaize, PA-C 11/21/17 1628    Bethann Berkshire,  MD 11/22/17 [phone]

## 2017-11-30 ENCOUNTER — Ambulatory Visit: Payer: Medicare Other | Admitting: Internal Medicine

## 2017-11-30 DIAGNOSIS — Z0289 Encounter for other administrative examinations: Secondary | ICD-10-CM

## 2017-11-30 NOTE — Progress Notes (Deleted)
Patient ID: Maria [name], female   DOB: [birth year], 77 y.o.   MRN: [id number]   HPI: Maria [name] is a 77 y.o.-year-old female, returning for f/u for thyroid nodules and DM2, dx in 1990s, insulin-dependent, uncontrolled, without long-term complications. Last visit  4 months ago.  Her sugars started to worsen after she had 2 steroid injections in shoulder in 03/2017: Sugars in the 500s, then decreased to 200s to 300s.  At last visit, we restarted [place].  She initially stopped it due to concerns of nodules under the skin and weight gain.  Last hemoglobin A1c was: Lab Results  Component Value Date   HGBA1C 9.9 05/18/2017   HGBA1C 8.3 (H) 11/10/2016   HGBA1C 6.7 (H) 07/05/2016   Pt is on a regimen of: - Glyburide 5 mg 2x a day - Tresiba 14 units daily-restarted 07/2017 She has multiple medication intolerances, including to metformin (GI upset), Lantus and Levemir (rash)  Pt checks her sugars 2X a day: - am: 300s now >> on insulin: 126-244, now: 200s >> *** - 2h after b'fast: n/c - before lunch: n/c - 2h after lunch: n/c - before dinner: n/c - 2h after dinner: 400-450 >> high 200s-300s >> *** - bedtime: n/c - nighttime: n/c Lowest sugar was 88 >> 126 >> ***; she has hypoglycemia awareness in the 80s. Highest sugar was 500s >> 300s >> ***.  Glucometer: AccuChek  Pt's meals are: - Breakfast: cereal + 2% milk - Lunch: sandwich + soup - Dinner: PB sandwich +/- jelly; grilled cheese; out during weekends - Snacks: 0-1: yoghurt Dances on Sat night.   - + Mild CKD, last BUN/creatinine:  Lab Results  Component Value Date   BUN 14 11/13/2017   BUN 25 (H) 10/29/2017   CREATININE 1.01 11/13/2017   CREATININE 1.42 (H) 10/29/2017  On losartan -+ HL; last set of lipids: Lab Results  Component Value Date   CHOL 106 10/26/2016   HDL 45 10/26/2016   LDLCALC 43 10/26/2016   LDLDIRECT 168.1 02/18/2010   TRIG 88 10/26/2016   CHOLHDL 2.4 10/26/2016  She is not on a statin -history  of previous intolerance - last eye exam was in 08/2017: No DR  -No numbness and tingling in her feet. On A ASA 81.  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  She also has hoarseness.  TSH was normal at last check: Lab Results  Component Value Date   TSH 2.98 07/05/2016   ROS: Constitutional: no weight gain/no weight loss, no fatigue, no subjective hyperthermia, no subjective hypothermia Eyes: no blurry vision, no xerophthalmia ENT: no sore throat, + see HPI Cardiovascular: no CP/no SOB/no palpitations/no leg swelling Respiratory: no cough/no SOB/no wheezing Gastrointestinal: no N/no V/no D/no C/no acid reflux Musculoskeletal: no muscle aches/no joint aches Skin: no rashes, no hair loss Neurological: no tremors/no numbness/no tingling/no dizziness  I reviewed pt's medications, allergies, PMH, social hx, family hx, and changes were documented in the history of present illness. Otherwise, unchanged from my initial  visit note.  Past Medical History:  Diagnosis Date  . Anemia 05/1985  . Carotid artery disease (HCC)    a. 02/2015 - 1-39% bilaterally.  . Chronic back pain   . Concussion   . Coronary artery disease    a. s/p DES to [facility] 01/2015.  . DDD (degenerative disc disease), cervical   . DDD (degenerative disc disease), lumbar   . Fracture of lower leg 07/2001   Right  . GERD (gastroesophageal reflux disease)   .  Hyperlipidemia   . Hypertension   . Multinodular thyroid   . Pneumonia ?1990's X 1  . PSVT (paroxysmal supraventricular tachycardia) (HCC)    a. s/p catheter ablation of a parahisian atrial tachycardia which was successfully ablated from the non-coronary cusp of the aortic root 06/2014.  [name] Renal cyst 07/10/2006   bilateral  . Right knee pain 02/2010   Injected - Dr. Shelle Iron  . Sinus pause    a. By event monitoring 09/2014 - BB stopped at that time.  . Syncope and collapse 07/2001   "that's when I broke my leg"  . Type II diabetes mellitus (HCC) 2002   Past Surgical History:  Procedure Laterality Date  . ANTERIOR CERVICAL DECOMP/DISCECTOMY FUSION  10/03/2005   C4/5; C5/6  "it's got a plate in there"  . BACK SURGERY    . BREAST BIOPSY Bilateral 1968 (multiple)   "all benign"  . BREAST LUMPECTOMY Bilateral 1968  . CARDIAC CATHETERIZATION N/A 11/18/2014   Procedure: Left Heart Cath and Coronary Angiography;  Surgeon: Peter M Swaziland, MD;  Location: [facility] INVASIVE CV LAB;  Service: Cardiovascular;  Laterality: N/A;  . CARDIAC CATHETERIZATION N/A 01/22/2015   Procedure: Coronary Stent Intervention;  Surgeon: Peter M Swaziland, MD;  Location: [facility] INVASIVE CV LAB;  Service: Cardiovascular;  Laterality: N/A;  . CARDIAC CATHETERIZATION N/A 06/10/2015   Procedure: Left Heart Cath and Coronary Angiography;  Surgeon: Peter M Swaziland, MD;  Location: [facility] INVASIVE CV LAB;  Service: Cardiovascular;  Laterality: N/A;  . CATARACT EXTRACTION W/ INTRAOCULAR LENS  IMPLANT, BILATERAL Bilateral 12/2010  . CORONARY ANGIOPLASTY    . ESOPHAGOGASTRODUODENOSCOPY  08/14/2005   gastropathy biopsy, negative  . LAPAROSCOPIC CHOLECYSTECTOMY  08/1992  . POSTERIOR LAMINECTOMY / DECOMPRESSION LUMBAR SPINE  09/2001   due to herniated disc  . SUPRAVENTRICULAR TACHYCARDIA ABLATION N/A 07/06/2014   Procedure: SUPRAVENTRICULAR TACHYCARDIA ABLATION;  Surgeon: Marinus Maw, MD;  Location: [facility] C[facility] LAB;  Service: Cardiovascular;  Laterality:  N/A;  . VAGINAL HYSTERECTOMY  1968   spotting   Social History   Socioeconomic History  . Marital status: Married    Spouse name: Not on file  . Number of children: 2  . Years of education: Not on file  . Highest education level: Not on file  Occupational History  . Occupation: Retired; Hotel manager, [employer]    Employer: RETIRED    Comment: Works a couple days a week  Social Needs  . Financial resource strain: Not on file  . Food insecurity:    Worry: Not on file    Inability: Not on file  . Transportation needs:    Medical: Not on file    Non-medical: Not on file  Tobacco Use  . Smoking status: Never Smoker  . Smokeless tobacco: Never Used  Substance and Sexual Activity  . Alcohol use: No    Alcohol/week: 0.0 oz  . Drug use: No  . Sexual activity: Yes  Lifestyle  . Physical activity:  Days per week: Not on file    Minutes per session: Not on file  . Stress: Not on file  Relationships  . Social connections:    Talks on phone: Not on file    Gets together: Not on file    Attends religious service: Not on file    Active member of club or organization: Not on file    Attends meetings of clubs or organizations: Not on file    Relationship status: Not on file  . Intimate partner violence:    Fear of current or ex partner: Not on file    Emotionally abused: Not on file    Physically abused: Not on file    Forced sexual activity: Not on file  Other Topics Concern  . Not on file  Social History Narrative   Widowed 2011 after 52 years of marriage (husband had C.diff)   Remarried 2015   Current Outpatient Medications on File Prior to Visit  Medication Sig Dispense Refill  . aspirin EC 81 MG tablet Take 81 mg by mouth once.     [name] atenolol (TENORMIN) 25 MG tablet TAKE 1/2 TABLET (12.5 MG TOTAL) BY MOUTH DAILY. 45 tablet 1  . glucose blood (ONE TOUCH ULTRA TEST) test strip USE AS INSTRUCTED TO TEST BLOOD SUGAR THREE TIMES DAILY. 300 each 1  .  glyBURIDE (DIABETA) 5 MG tablet Take 1-2 tablets (5-10 mg total) by mouth daily with breakfast. 60 tablet 5  . insulin degludec (TRESIBA FLEXTOUCH) 100 UNIT/ML SOPN FlexTouch Pen Inject 0.14 mLs (14 Units total) into the skin daily at 10 pm. 5 pen 11  . Insulin Pen Needle (PEN NEEDLES) 32G X 4 MM MISC 1 each by Does not apply route daily. 100 each 3  . lidocaine (LIDODERM) 5 % Place 1 patch onto the skin daily. Remove & Discard patch within 12 hours or as directed by MD 30 patch 0  . loperamide (IMODIUM) 2 MG capsule Take 1 mg by mouth as needed for diarrhea or loose stools.    [name] losartan (COZAAR) 25 MG tablet Take 1 tablet (25 mg total) by mouth daily. Please make an appt with Dr. [name] for October for future refills. 90 tablet 0  . methocarbamol (ROBAXIN) 500 MG tablet Take 1 tablet (500 mg total) by mouth 2 (two) times daily as needed for muscle spasms. 10 tablet 0  . nitroGLYCERIN (NITROSTAT) 0.4 MG SL tablet PLACE 1 TABLET (0.4 MG TOTAL) UNDER THE TONGUE EVERY 5 (FIVE) MINUTES AS NEEDED FOR CHEST PAIN. 25 tablet 1  . omeprazole (PRILOSEC) 20 MG capsule Take 1 capsule (20 mg total) by mouth 2 (two) times daily. 60 capsule 5  . oxyCODONE (ROXICODONE) 5 MG immediate release tablet Take 0.5-1 tablets (2.5-5 mg total) by mouth every 6 (six) hours as needed for severe pain. 30 tablet 0  . traMADol (ULTRAM) 50 MG tablet Take 1 tablet (50 mg total) by mouth every 6 (six) hours as needed for moderate pain (sedation caution). 60 tablet 2   No current facility-administered medications on file prior to visit.    Allergies  Allergen Reactions  . Acetaminophen     REACTION: jittery with plain tylenol, but tolerated tylenol/benadryl combination  . Carvedilol     REACTION: nightmares  . Crestor [Rosuvastatin]     aches  . Ezetimibe-Simvastatin     REACTION: muscle aches  . Gabapentin Other (See Comments)    diarrhea  . Glipizide Other (See Comments)    GI intolerance.    [name]  Ibuprofen Other (See  Comments)    REACTION: GI upset at high doses  . Insulin Glargine     Rash  . Levemir [Insulin Detemir]     rash  . Macrobid [Nitrofurantoin Macrocrystal] Other (See Comments)    Achey, N & V  . Metformin     REACTION: GI upset  . Metoprolol Tartrate     REACTION: nightmares  . Naproxen     REACTION: GI upset  . Nsaids   . Pravastatin Other (See Comments)    Myalgias   Family History  Problem Relation Age of Onset  . Hypertension Mother   . Heart disease Mother   . Diabetes Sister   . Myasthenia gravis Sister   . Diabetes Brother        1/2 juvenile; foot ulcer; periph. neuropathy  . Heart disease Maternal Grandfather        MI  . Hypertension Maternal Grandfather   . Depression Paternal Aunt   . Hypertension Maternal Grandmother   . Diabetes Maternal Grandmother   . Stroke Maternal Grandmother   . Hypertension Paternal Grandmother   . Diabetes Paternal Grandmother   . Hypertension Paternal Grandfather   . Diabetes Paternal Grandfather   . Breast cancer Maternal Aunt   . Colon cancer Neg Hx     PE: There were no vitals taken for this visit. Wt Readings from Last 3 Encounters:  11/21/17 175 lb (79.4 kg)  11/13/17 175 lb 8 oz (79.6 kg)  10/29/17 175 lb 8 oz (79.6 kg)   Constitutional: overweight, in NAD Eyes: PERRLA, EOMI, no exophthalmos ENT: moist mucous membranes, no thyromegaly, no cervical lymphadenopathy Cardiovascular: RRR, No MRG Respiratory: CTA B Gastrointestinal: abdomen soft, NT, ND, BS+ Musculoskeletal: no deformities, strength intact in all 4 Skin: moist, warm, no rashes Neurological: no tremor with outstretched hands, DTR normal in all 4  ASSESSMENT: 1. DM2, insulin-dependent, uncontrolled, without long-term complications, but with hyperglycemia  2. Thyroid nodules  No orders of the defined types were placed in this encounter.  Adequacy Reason Satisfactory For Evaluation. Diagnosis THYROID, FINE NEEDLE ASPIRATION LEFTS, LEFT MID LOBE  (SPECIMEN 1 OF 1 COLLECTED 05/31/2017) CONSISTENT WITH BENIGN FOLLICULAR NODULE (B[place] CATEGORY II). Jimmy PicketJOHN PATRICK MD Pathologist, Electronic Signature (Case signed 06/01/2017) Specimen Clinical Information Left Mid, 2.6cm; Other 2 dimensions: 1.4 x 1.8 cm, previously 2.5 x 1.4 x 1.7cm solid/almost completely solid isoechoic TI-RADS - 3, Mildly suspicious nodule Source Thyroid, Fine Needle Aspiration, Left, LMP (Specimen 1 of 1, collected on 05/31/17)  Thyroid nodule is benign.  States she has some neck compression symptoms, I will order a barium swallow to see if the compression is external or internal.   Details   Reading Physician Reading Date Result Priority  Dwyane DeeBarry, Paul, MD 06/21/2017     Narrative    CLINICAL DATA: Dysphagia  EXAM: ESOPHOGRAM / BARIUM SWALLOW / BARIUM TABLET STUDY  TECHNIQUE: Combined double contrast and single contrast examination performed using effervescent crystals, thick barium liquid, and thin barium liquid. The patient was observed with fluoroscopy swallowing a 13 mm barium sulphate tablet.  FLUOROSCOPY TIME: Fluoroscopy Time: 1 minute 18 seconds  Radiation Exposure Index (if provided by the fluoroscopic device): 61 mGy  Number of Acquired Spot Images: 0  COMPARISON: None.  FINDINGS: Initially rapid sequence spot films of the cervical esophagus were performed. Swallowing mechanism is unremarkable with no penetration or aspiration noted. Anterior fusion plate is present from C4 to C6. A double-contrast barium swallow shows the mucosa of the  esophagus to be unremarkable. The gastroesophageal junction is noted to be somewhat patulous. There does appear to be a small sliding hiatal hernia present. Moderate gastroesophageal reflux is demonstrated. A barium pill was given at the end of the study which passed into the stomach without delay.  IMPRESSION: 1. Small sliding hiatal hernia with moderate gastroesophageal reflux. 2. Barium pill  passes into the stomach without delay.   Electronically Signed By: Dwyane Dee M.D. On: 06/21/2017 10:47       Normal barium swallow, without external compression of the thyroid on the esophagus.  + Moderate GERD.  PLAN:  1. Patient with long-standing, uncontrolled, type 2 diabetes, on oral antidiabetic regimen to which we added Tresiba at last visit,   As sugars were still quite high, despite improvement from the previous visit.  She was reticent to restart on insulin due to concerns for weight gain, but we discussed about the risks of diabetes complications and she agreed to use [place].  Of note, she is on glyburide since she tried glipizide and developed abdominal pain and diarrhea.  She had intolerance to Levemir and Lantus, also.  We will discuss at that time about the possibility to add a GLP-1 receptor agonist to help with weight gain -advised her to check with her insurance to see which ones are covered.  We also discussed about checking her for type 1 diabetes, however, at the time of last visit, her glucose was high, at 255, 2 high to check a C-peptide.  - I suggested to:  Patient Instructions  Please continue - Glyburide 5 mg 2x a day before meals - Tresiba 14 units daily  Please check with the pharmacy or your insurance if the following medicines are covered: - Trulicity (once a week) - Ozempic (once a week) - Bydureon (once a week) - Victoza (once a day)  Please return in 3 months with your sugar log  - today, HbA1c is 7%  - continue checking sugars at different times of the day - check 1x a day, rotating checks - advised for yearly eye exams >> she is UTD - Return to clinic in 3 mo with sugar log   2. Thyroid nodules -Reviewed the report of her thyroid ultrasound and also the images.  The dominant nodule is large, but without other concerning features.  She does not have a family history of thyroid cancer or personal history of radiation therapy to head or neck.   She had a previous biopsy of her left thyroid nodule in 2007 but this yielded an insufficient sample.  We repeated a biopsy of her left 2.6 cm thyroid nodule in 05/2017 with benign results. - She had neck compression symptoms in the past, but the barium swallow in 06/2017 showed only a small sliding hiatal hernia, with moderate GERD, but without external compression on the esophagus - We will continue to manage her nodules expectantly  Carlus Pavlov, MD PhD [facility] Endocrinology

## 2017-12-03 ENCOUNTER — Ambulatory Visit: Payer: Medicare Other | Admitting: Family Medicine

## 2017-12-03 ENCOUNTER — Encounter: Payer: Self-pay | Admitting: Family Medicine

## 2017-12-03 DIAGNOSIS — M5431 Sciatica, right side: Secondary | ICD-10-CM | POA: Diagnosis not present

## 2017-12-03 MED ORDER — OXYCODONE HCL 5 MG PO TABS: 2.5000 mg | ORAL_TABLET | Freq: Four times a day (QID) | ORAL | 0 refills | Status: DC | PRN

## 2017-12-03 NOTE — Patient Instructions (Signed)
Ask Elsner about moving up the appointment.  Restart oxycodone and udpate me as needed.  Take care.  Glad to see you.

## 2017-12-03 NOTE — Progress Notes (Signed)
She had injection on and had pain at the time, then some brief relief, then more pain in the meantime.  She tried to go off oxycodone but had more pain.  She is going to f/u with Dr. Danielle DessElsner when possible- she is trying to get that appointment moved up.  Oxycodone helped prev, to "take the edge off."  No ADE on med.  Still with R leg sciatica.  No L sided sx.     Meds, vitals, and allergies reviewed.   ROS: Per HPI unless specifically indicated in ROS section   nad ncat rrr ctab abd soft, not ttp, normal BS Back w/o midline pain, no cva pain B calf not tight and B calf 31cm.   R SLR pos and altered sensation on R leg.   Able to bear weight.  Distally NV intact BLE o/w w/o weakness

## 2017-12-06 NOTE — Assessment & Plan Note (Signed)
She will Ask Dr. Danielle DessElsner about moving up the appointment.  Restart oxycodone and udpate me as needed.  Routine cautions given.  She agrees.  Still okay for outpatient follow-up.

## 2017-12-17 ENCOUNTER — Other Ambulatory Visit: Payer: Self-pay | Admitting: Family Medicine

## 2017-12-17 NOTE — Telephone Encounter (Signed)
Copied from CRM [phone]#[id number]. Topic: Quick Communication - Rx Refill/Question >> Dec 17, 2017  5:31 PM Mcneil, Ja-Kwan wrote: Medication: oxyCODONE (ROXICODONE) 5 MG immediate release tablet  Has the patient contacted their pharmacy? yes   Preferred Pharmacy (with phone number or street name): [facility] [place] - [id number] [phone] (Phone) [phone] (Fax)   Agent: Please be advised that RX refills may take up to 3 business days. We ask that you follow-up with your pharmacy.

## 2017-12-17 NOTE — Telephone Encounter (Signed)
Oxycodone 5g refill Last Refill:12/03/17 #30 Last OV: 12/03/17 PCP: Dr. Para Marchuncan [facility]

## 2017-12-17 NOTE — Telephone Encounter (Signed)
Name of Medication: Oxycodone  Name of Pharmacy: [facility] Last Fill or Written Date and Quantity: 12/03/17 #30 Last Office Visit and Type: 12/03/17/acute Next Office Visit and Type: none scheduled Last Controlled Substance Agreement Date: None Last UDS: None

## 2017-12-18 NOTE — Telephone Encounter (Signed)
[place] CSRS reviewed. Last filled oxycodone 12/03/2017 #30.  Using for acute sciatica.  Also receiving tramadol. Also received #15 oxycodone on 11/28/2017 from Dr Ethelene Halamos Will send in #20 while PCP returns to town.  plz notify pt this was sent in.   Will forward to PCP.

## 2017-12-18 NOTE — Telephone Encounter (Signed)
Patient going out of town, needs meds today. Please advise

## 2017-12-18 NOTE — Telephone Encounter (Signed)
There is another phone note dated 12/17/17 with a notation pt is going out of town and needs refill oxycodone done 12/18/17.Please advise.

## 2017-12-19 NOTE — Telephone Encounter (Signed)
Completely agree with this given her recent pain/clinical situation.  Thanks.

## 2017-12-20 NOTE — Telephone Encounter (Signed)
I sent this in 8/6 #20.

## 2017-12-25 ENCOUNTER — Ambulatory Visit: Payer: Medicare Other | Admitting: Family Medicine

## 2017-12-25 ENCOUNTER — Encounter: Payer: Self-pay | Admitting: Family Medicine

## 2017-12-25 VITALS — BP 130/62 | HR 69 | Temp 98.5°F | Ht 66.0 in | Wt 162.8 lb

## 2017-12-25 DIAGNOSIS — N281 Cyst of kidney, acquired: Secondary | ICD-10-CM | POA: Diagnosis not present

## 2017-12-25 DIAGNOSIS — M5431 Sciatica, right side: Secondary | ICD-10-CM

## 2017-12-25 DIAGNOSIS — E1159 Type 2 diabetes mellitus with other circulatory complications: Secondary | ICD-10-CM

## 2017-12-25 MED ORDER — OMEPRAZOLE 20 MG PO CPDR: 20.0000 mg | DELAYED_RELEASE_CAPSULE | Freq: Two times a day (BID) | ORAL | 5 refills | Status: DC

## 2017-12-25 MED ORDER — OXYCODONE HCL 5 MG PO TABS: ORAL_TABLET | ORAL | 0 refills | Status: DC

## 2017-12-25 NOTE — Progress Notes (Signed)
She has seen Dr. Danielle DessElsner in the meantime.  The plan was to get injection done through his clinic and then possibly have surgery on L2-5 for relief.  She had repeat injection with some relief in the meantime but she is still having back pain.  No ADE on oxycodone, she has used prn.  She is tired of hurting all the time and it is limiting her activity during the day.  Still with R leg sciatica.  No L sided sx.    We talked about getting f/u u/s done on R kidney. Last Cr was reasonable.   Defer lipid check for now until she has her back addressed.    Sugar has been ~125-150 recently.  Due for follow-up A1c.  Meds, vitals, and allergies reviewed.   ROS: Per HPI unless specifically indicated in ROS section   nad ncat rrr ctab abd soft Ext w/o edema R leg paresthesia noted.   Walking with a limp due to pain.

## 2017-12-25 NOTE — Patient Instructions (Addendum)
We will call about your referral.  Marion or Anastasiya will call you if you don't see one of them on the way out.  Go to the lab on the way out.  We'll contact you with your lab report. Take care.  Glad to see you.  

## 2017-12-26 LAB — BASIC METABOLIC PANEL
BUN: 29 mg/dL — ABNORMAL HIGH (ref 6–23)
CHLORIDE: 102 meq/L (ref 96–112)
CO2: 24 meq/L (ref 19–32)
Calcium: 9.6 mg/dL (ref 8.4–10.5)
Creatinine, Ser: 1.26 mg/dL — ABNORMAL HIGH (ref 0.40–1.20)
GFR: 43.61 mL/min — ABNORMAL LOW (ref >60.00)
GLUCOSE: 399 mg/dL — AB (ref 70–99)
POTASSIUM: 4.9 meq/L (ref 3.5–5.1)
Sodium: 135 mEq/L (ref 135–145)

## 2017-12-26 LAB — HEMOGLOBIN A1C: HEMOGLOBIN A1C: 7.7 % — AB (ref 4.6–6.5)

## 2017-12-27 ENCOUNTER — Other Ambulatory Visit: Payer: Self-pay | Admitting: Physician Assistant

## 2017-12-27 NOTE — Assessment & Plan Note (Signed)
Recheck A1c today in preparation for possible surgery.  See notes on labs.

## 2017-12-27 NOTE — Telephone Encounter (Signed)
Prescribing Provider Encounter Provider  Dunn, Tacey Ruizayna N, PA-C DunnTacey Ruiz, Dayna N, PA-C  Outpatient Medication Detail    Disp Refills Start End   atenolol (TENORMIN) 25 MG tablet 45 tablet 1 08/02/2017    Sig: TAKE 1/2 TABLET (12.5 MG TOTAL) BY MOUTH DAILY.   Sent to pharmacy as: atenolol (TENORMIN) 25 MG tablet   E-Prescribing Status: Receipt confirmed by pharmacy (08/02/2017 2:53 PM EDT)   Pharmacy   [facility] - [place], [place] - [address]

## 2017-12-27 NOTE — Assessment & Plan Note (Signed)
Recheck u/s, see notes on Cr.  Okay for outpatient f/u.

## 2017-12-27 NOTE — Assessment & Plan Note (Signed)
I will defer to neurosurgery clinic about further intervention.  See notes on labs today in preparation for possible surgery.  Prescription done for oxycodone.  No adverse effect on medication.  Routine cautions given.  She agrees with plan. >25 minutes spent in face to face time with patient, >50% spent in counselling or coordination of care.

## 2018-01-01 ENCOUNTER — Ambulatory Visit
Admission: RE | Admit: 2018-01-01 | Discharge: 2018-01-01 | Disposition: A | Discharge status: Home / Self Care | Payer: Medicare Other | Source: Ambulatory Visit | Attending: Family Medicine | Admitting: Family Medicine

## 2018-01-01 DIAGNOSIS — N281 Cyst of kidney, acquired: Secondary | ICD-10-CM

## 2018-01-02 ENCOUNTER — Telehealth: Payer: Self-pay | Admitting: Cardiovascular Disease

## 2018-01-02 NOTE — Telephone Encounter (Signed)
° °  [facility] Pre-operative Risk Assessment    Request for surgical clearance:  1. What type of surgery is being performed? L2-3 L3-4 L4-5 XLIF with Percutaneous Posterior Arthrodesis   2. When is this surgery scheduled? TBD  3. What type of clearance is required (medical clearance vs. Pharmacy clearance to hold med vs. Both)? Medical   4. Are there any medications that need to be held prior to surgery and how long? Please advise any medications that need to be held and how long.    5. Practice name and name of physician performing surgery? [facility], Dr. Kristeen Miss   6. What is your office phone number [phone] ATTN: Janett Billow   7.   What is your office fax number [phone]   8.   Anesthesia type (None, local, MAC, general) ? Not listed.    Maria [name] 01/02/2018, 3:16 PM  _________________________________________________________________   (provider comments below)

## 2018-01-07 ENCOUNTER — Other Ambulatory Visit: Payer: Self-pay | Admitting: Neurological Surgery

## 2018-01-07 ENCOUNTER — Other Ambulatory Visit (HOSPITAL_COMMUNITY): Payer: Self-pay | Admitting: Neurological Surgery

## 2018-01-07 DIAGNOSIS — M48062 Spinal stenosis, lumbar region with neurogenic claudication: Secondary | ICD-10-CM

## 2018-01-08 ENCOUNTER — Other Ambulatory Visit: Payer: Self-pay | Admitting: Family Medicine

## 2018-01-08 NOTE — Telephone Encounter (Signed)
Name of Medication: Oxycodone Name of Pharmacy: [facility] Last Fill or Written Date and Quantity:  30 tablet 0 12/25/2017  Last Office Visit and Type: 12/25/17 DM Next Office Visit and Type: None Last Controlled Substance Agreement Date: None Last UDS: None

## 2018-01-08 NOTE — Telephone Encounter (Signed)
   Primary Cardiologist:Peter Eden EmmsNishan, MD  Chart reviewed as part of pre-operative protocol coverage. Because of Maria DungBarbara D [name]'s past medical history and time since last visit, he/she will require a follow-up visit in order to better assess preoperative cardiovascular risk.  Pre-op covering staff: - Please schedule appointment and call patient to inform them. - Please contact requesting surgeon's office via preferred method (i.e, phone, fax) to inform them of need for appointment prior to surgery.  Roe RutherfordAngela Nicole Duke, PA  01/08/2018, 2:50 PM

## 2018-01-08 NOTE — Telephone Encounter (Signed)
Call pt and scheduled appt 9-5 @2pm 

## 2018-01-09 NOTE — Telephone Encounter (Signed)
Sent. Thanks.   

## 2018-01-16 NOTE — Progress Notes (Signed)
Cardiology Office Note:    Date:  01/17/2018   ID:  Maria [name], DOB 1940-05-28, MRN [id number]  PCP:  Joaquim Nam, MD  Cardiologist:  Charlton Haws, MD   Referring MD: Joaquim Nam, MD   Chief Complaint  Patient presents with  . Chest Pain    states some tightness and palpatations   . Shortness of Breath    History of Present Illness:    Maria [name] is a 77 y.o. female with a hx of CAD status post DES to mid left circumflex on 01/2015, hypertension, diabetes, GERD, hyperlipidemia intolerant to multiple statins, tried repatha but had SOB and was told to stop repatha, SVT status post ablation on 06/2014.  She was last seen by Dr. [name] on 03/13/2017.  During her clinic visit in 08/2016 she complained of chest pain and had a normal Myoview study with an LVEF of 72%.   She presents today for preoperative cardiac clearance for back surgery.  She describes intermittent chest pain and shortness of breath that she has been having for the past year.  She cannot tell if the chest pain is related to his back pain or her heart.  She is unable to complete greater than 4 METS given her ongoing back pain.  She wants to be back on cholesterol medicine.  She did not tolerate Repatha.  Past Medical History:  Diagnosis Date  . Anemia 05/1985  . Carotid artery disease (HCC)    a. 02/2015 - 1-39% bilaterally.  . Chronic back pain   . Concussion   . Coronary artery disease    a. s/p DES to [facility] 01/2015.  . DDD (degenerative disc disease), cervical   . DDD (degenerative disc disease), lumbar   . Fracture of lower leg 07/2001   Right  . GERD (gastroesophageal reflux disease)   . Hyperlipidemia   . Hypertension   . Multinodular thyroid   . Pneumonia ?1990's X 1  . PSVT (paroxysmal supraventricular tachycardia) (HCC)    a. s/p catheter ablation of a parahisian atrial tachycardia which was successfully ablated from the non-coronary cusp of the aortic root 06/2014.  [name] Renal cyst 07/10/2006    bilateral  . Right knee pain 02/2010   Injected - Dr. Shelle Iron  . Sinus pause    a. By event monitoring 09/2014 - BB stopped at that time.  . Syncope and collapse 07/2001   "that's when I broke my leg"  . Type II diabetes mellitus (HCC) 2002    Past Surgical History:  Procedure Laterality Date  . ANTERIOR CERVICAL DECOMP/DISCECTOMY FUSION  10/03/2005   C4/5; C5/6  "it's got a plate in there"  . BACK SURGERY    . BREAST BIOPSY Bilateral 1968 (multiple)   "all benign"  . BREAST LUMPECTOMY Bilateral 1968  . CARDIAC CATHETERIZATION N/A 11/18/2014   Procedure: Left Heart Cath and Coronary Angiography;  Surgeon: Peter M Swaziland, MD;  Location: [facility] INVASIVE CV LAB;  Service: Cardiovascular;  Laterality: N/A;  . CARDIAC CATHETERIZATION N/A 01/22/2015   Procedure: Coronary Stent Intervention;  Surgeon: Peter M Swaziland, MD;  Location: [facility] INVASIVE CV LAB;  Service: Cardiovascular;  Laterality: N/A;  . CARDIAC CATHETERIZATION N/A 06/10/2015   Procedure: Left Heart Cath and Coronary Angiography;  Surgeon: Peter M Swaziland, MD;  Location: [facility] INVASIVE CV LAB;  Service: Cardiovascular;  Laterality: N/A;  . CATARACT EXTRACTION W/ INTRAOCULAR LENS  IMPLANT, BILATERAL Bilateral 12/2010  . CORONARY ANGIOPLASTY    . ESOPHAGOGASTRODUODENOSCOPY  08/14/2005  gastropathy biopsy, negative  . LAPAROSCOPIC CHOLECYSTECTOMY  08/1992  . POSTERIOR LAMINECTOMY / DECOMPRESSION LUMBAR SPINE  09/2001   due to herniated disc  . SUPRAVENTRICULAR TACHYCARDIA ABLATION N/A 07/06/2014   Procedure: SUPRAVENTRICULAR TACHYCARDIA ABLATION;  Surgeon: Marinus Maw, MD;  Location: [facility] CATH LAB;  Service: Cardiovascular;  Laterality: N/A;  . VAGINAL HYSTERECTOMY  1968   spotting    Current Medications: Current Meds  Medication Sig  . aspirin EC 81 MG tablet Take 81 mg by mouth once.   [name] atenolol (TENORMIN) 25 MG tablet TAKE 1/2 TABLET (12.5 MG TOTAL) BY MOUTH DAILY.  [name] glucose blood (ONE TOUCH ULTRA TEST) test strip USE AS INSTRUCTED  TO TEST BLOOD SUGAR THREE TIMES DAILY.  [name] glyBURIDE (DIABETA) 5 MG tablet Take 1-2 tablets (5-10 mg total) by mouth daily with breakfast.  . insulin degludec (TRESIBA FLEXTOUCH) 100 UNIT/ML SOPN FlexTouch Pen Inject 0.14 mLs (14 Units total) into the skin daily at 10 pm.  . Insulin Pen Needle (PEN NEEDLES) 32G X 4 MM MISC 1 each by Does not apply route daily.  [name] lidocaine (LIDODERM) 5 % Place 1 patch onto the skin daily. Remove & Discard patch within 12 hours or as directed by MD  . loperamide (IMODIUM) 2 MG capsule Take 1 mg by mouth as needed for diarrhea or loose stools.  [name] losartan (COZAAR) 25 MG tablet Take 1 tablet (25 mg total) by mouth daily. Please make an appt with Dr. [name] for October for future refills.  . nitroGLYCERIN (NITROSTAT) 0.4 MG SL tablet PLACE 1 TABLET (0.4 MG TOTAL) UNDER THE TONGUE EVERY 5 (FIVE) MINUTES AS NEEDED FOR CHEST PAIN.  [name] omeprazole (PRILOSEC) 20 MG capsule Take 1 capsule (20 mg total) by mouth 2 (two) times daily.  [name] oxyCODONE (OXY IR/ROXICODONE) 5 MG immediate release tablet TAKE 1/2 TO 1 TABLET BY MOUTH EVERY 6 HOURS AS NEEDED FOR SEVERE PAIN.     Allergies:   Acetaminophen; Carvedilol; Crestor [rosuvastatin]; Ezetimibe-simvastatin; Gabapentin; Glipizide; Ibuprofen; Insulin glargine; Levemir [insulin detemir]; Macrobid [nitrofurantoin macrocrystal]; Metformin; Metoprolol tartrate; Naproxen; Nsaids; and Pravastatin   Social History   Socioeconomic History  . Marital status: Married    Spouse name: Not on file  . Number of children: 2  . Years of education: Not on file  . Highest education level: Not on file  Occupational History  . Occupation: Retired; Hotel manager, [employer]    Employer: RETIRED    Comment: Works a couple days a week  Social Needs  . Financial resource strain: Not on file  . Food insecurity:    Worry: Not on file    Inability: Not on file  . Transportation needs:    Medical: Not on file    Non-medical: Not  on file  Tobacco Use  . Smoking status: Never Smoker  . Smokeless tobacco: Never Used  Substance and Sexual Activity  . Alcohol use: No    Alcohol/week: 0.0 standard drinks  . Drug use: No  . Sexual activity: Yes  Lifestyle  . Physical activity:    Days per week: Not on file    Minutes per session: Not on file  . Stress: Not on file  Relationships  . Social connections:    Talks on phone: Not on file    Gets together: Not on file    Attends religious service: Not on file    Active member of club or organization: Not on file    Attends meetings of clubs  or organizations: Not on file    Relationship status: Not on file  Other Topics Concern  . Not on file  Social History Narrative   Widowed 2011 after 52 years of marriage (husband had C.diff)   Remarried 2015     Family History: The patient's family history includes Breast cancer in her maternal aunt; Depression in her paternal aunt; Diabetes in her brother, maternal grandmother, paternal grandfather, paternal grandmother, and sister; Heart disease in her maternal grandfather and mother; Hypertension in her maternal grandfather, maternal grandmother, mother, paternal grandfather, and paternal grandmother; Myasthenia gravis in her sister; Stroke in her maternal grandmother. There is no history of Colon cancer.  ROS:   Please see the history of present illness.    All other systems reviewed and are negative.  EKGs/Labs/Other Studies Reviewed:    The following studies were reviewed today:  Myoview 08/22/16  Nuclear stress EF: 72%.  There was no ST segment deviation noted during stress.  The study is normal.  The left ventricular ejection fraction is hyperdynamic (>65%).   Normal stress nuclear study with no ischemia or infarction; EF 72 with normal wall motion.  EKG:  EKG is ordered today.  The ekg ordered today demonstrates TWI and ST depression in V2/3, unchanged from prior  Recent Labs: 02/22/2017: Hemoglobin  12.2 12/25/2017: BUN 29; Creatinine, Ser 1.26; Potassium 4.9; Sodium 135  Recent Lipid Panel    Component Value Date/Time   CHOL 106 10/26/2016 0848   TRIG 88 10/26/2016 0848   HDL 45 10/26/2016 0848   CHOLHDL 2.4 10/26/2016 0848   CHOLHDL 5 07/05/2016 1602   VLDL 18.4 07/05/2016 1602   LDLCALC 43 10/26/2016 0848   LDLDIRECT 168.1 02/18/2010 0751    Physical Exam:    VS:  BP 122/76   Pulse 69   Ht 5\' 5"  (1.651 m)   Wt 164 lb 12.8 oz (74.8 kg)   BMI 27.42 kg/m     Wt Readings from Last 3 Encounters:  01/17/18 164 lb 12.8 oz (74.8 kg)  12/25/17 162 lb 12 oz (73.8 kg)  12/03/17 164 lb 8 oz (74.6 kg)     GEN:  Well nourished, well developed in no acute distress HEENT: Normal NECK: No JVD; No carotid bruits LYMPHATICS: No lymphadenopathy CARDIAC: RRR, no murmurs, rubs, gallops RESPIRATORY:  Clear to auscultation without rales, wheezing or rhonchi  ABDOMEN: Soft, non-tender, non-distended MUSCULOSKELETAL:  No edema; No deformity  SKIN: Warm and dry NEUROLOGIC:  Alert and oriented x 3 PSYCHIATRIC:  Normal affect   ASSESSMENT:    1. Atherosclerosis of native coronary artery of native heart with unstable angina pectoris (HCC)   2. SVT (supraventricular tachycardia) (HCC)   3. Type 2 diabetes mellitus with vascular disease (HCC)   4. Hyperlipidemia, unspecified hyperlipidemia type   5. Preoperative clearance    PLAN:    In order of problems listed above:  Atherosclerosis of native coronary artery of native heart with unstable angina pectoris (HCC) Because she describes typical and atypical features of chest pain which she cannot discriminate from her back pain, and she reports shortness of breath over the past year, we will complete a repeat Lexiscan Myoview.  She does not have JVD or lower extremity swelling and lungs are clear on exam.  I do not suspect a component of heart failure.  If Lexiscan Myoview shows no ischemia, we will clear her for back surgery.  SVT  (supraventricular tachycardia) (HCC) S/P ablation in 2016 She reports intermittent palpitations.  She is not having dizziness or syncope with these palpitations.  They have been ongoing since 2016.  She is not interested in wearing a monitor at this time.  If they become activity-limiting, we will have her see EP again  Type 2 diabetes mellitus with vascular disease (HCC) She is on [place] and glyburide.  Hyperlipidemia, unspecified hyperlipidemia type She has been intolerant to multiple statins and became short of breath on Repatha.  She would like to go back to the lipid clinic for a different cholesterol-lowering agent.  We will make this referral.  Preoperative clearance Given her complaints of chest pain and shortness of breath, and known coronary artery disease, I will obtain another Lexiscan Myoview stress test.  If this is negative for new ischemia, she can be cleared for cardiac surgery with the understanding that she is at a higher risk given her coronary artery disease and diabetes.  Follow up with Dr. [name] in 6 months.   Medication Adjustments/Labs and Tests Ordered: Current medicines are reviewed at length with the patient today.  Concerns regarding medicines are outlined above.  No orders of the defined types were placed in this encounter.  No orders of the defined types were placed in this encounter.   Signed, Roe Rutherford Ratasha Fabre, PA  01/17/2018 2:28 PM    [facility]

## 2018-01-17 ENCOUNTER — Ambulatory Visit: Payer: Medicare Other | Admitting: Physician Assistant

## 2018-01-17 ENCOUNTER — Encounter: Payer: Self-pay | Admitting: Physician Assistant

## 2018-01-17 VITALS — BP 122/76 | HR 69 | Ht 65.0 in | Wt 164.8 lb

## 2018-01-17 DIAGNOSIS — I471 Supraventricular tachycardia: Secondary | ICD-10-CM | POA: Diagnosis not present

## 2018-01-17 DIAGNOSIS — Z01818 Encounter for other preprocedural examination: Secondary | ICD-10-CM

## 2018-01-17 DIAGNOSIS — E1159 Type 2 diabetes mellitus with other circulatory complications: Secondary | ICD-10-CM | POA: Diagnosis not present

## 2018-01-17 DIAGNOSIS — I25110 Atherosclerotic heart disease of native coronary artery with unstable angina pectoris: Secondary | ICD-10-CM | POA: Diagnosis not present

## 2018-01-17 DIAGNOSIS — E785 Hyperlipidemia, unspecified: Secondary | ICD-10-CM

## 2018-01-17 NOTE — Patient Instructions (Addendum)
Medication Instructions:  No Changes. If you need a refill on your cardiac medications before your next appointment, please call your pharmacy.  Labwork: None Ordered.  Testing/Procedures: Your physician has requested that you have a lexiscan myoview. For further information please visit [email or web address] Please follow instruction sheet, as given.  Follow-Up: Your physician wants you to follow-up in: 6 Months with Dr.[name]. You should receive a reminder letter in the mail two months in advance. Your physician recommends that you schedule a follow-up appointment with: LIPID CLINIC with [address] ASAP    Thank you for choosing [facility]

## 2018-01-18 ENCOUNTER — Telehealth (HOSPITAL_COMMUNITY): Payer: Self-pay | Admitting: *Deleted

## 2018-01-18 NOTE — Telephone Encounter (Signed)
Patient given detailed instructions per Myocardial Perfusion Study Information Sheet for the test on 01/21/18 at 10:15. Patient notified to arrive 15 minutes early and that it is imperative to arrive on time for appointment to keep from having the test rescheduled.  If you need to cancel or reschedule your appointment, please call the office within 24 hours of your appointment. . Patient verbalized understanding.Daneil Dolin

## 2018-01-21 ENCOUNTER — Ambulatory Visit (HOSPITAL_COMMUNITY): Payer: Medicare Other | Attending: Internal Medicine

## 2018-01-21 ENCOUNTER — Other Ambulatory Visit: Payer: Self-pay | Admitting: Family Medicine

## 2018-01-21 ENCOUNTER — Ambulatory Visit (INDEPENDENT_AMBULATORY_CARE_PROVIDER_SITE_OTHER): Payer: Medicare Other | Admitting: Pharmacist

## 2018-01-21 VITALS — Ht 65.0 in | Wt 164.0 lb

## 2018-01-21 DIAGNOSIS — E782 Mixed hyperlipidemia: Secondary | ICD-10-CM | POA: Diagnosis not present

## 2018-01-21 DIAGNOSIS — Z01818 Encounter for other preprocedural examination: Secondary | ICD-10-CM | POA: Insufficient documentation

## 2018-01-21 DIAGNOSIS — I471 Supraventricular tachycardia: Secondary | ICD-10-CM | POA: Diagnosis not present

## 2018-01-21 DIAGNOSIS — E1159 Type 2 diabetes mellitus with other circulatory complications: Secondary | ICD-10-CM | POA: Insufficient documentation

## 2018-01-21 DIAGNOSIS — E785 Hyperlipidemia, unspecified: Secondary | ICD-10-CM | POA: Diagnosis not present

## 2018-01-21 DIAGNOSIS — I25110 Atherosclerotic heart disease of native coronary artery with unstable angina pectoris: Secondary | ICD-10-CM

## 2018-01-21 DIAGNOSIS — I2510 Atherosclerotic heart disease of native coronary artery without angina pectoris: Secondary | ICD-10-CM

## 2018-01-21 LAB — MYOCARDIAL PERFUSION IMAGING
LV dias vol: 28 mL (ref 46–106)
LV sys vol: 7 mL
Peak HR: 100 {beats}/min
Rest HR: 68 {beats}/min
SDS: 2
SRS: 0
SSS: 2
TID: 1.03

## 2018-01-21 MED ORDER — TECHNETIUM TC 99M TETROFOSMIN IV KIT
30.8000 | PACK | Freq: Once | INTRAVENOUS | Status: AC | PRN
  Administered 2018-01-21: 30.8 via INTRAVENOUS
  Filled 2018-01-21: qty 31

## 2018-01-21 MED ORDER — ALIROCUMAB 75 MG/ML ~~LOC~~ SOPN: 1.0000 "pen " | PEN_INJECTOR | SUBCUTANEOUS | 11 refills | Status: DC

## 2018-01-21 MED ORDER — REGADENOSON 0.4 MG/5ML IV SOLN
0.4000 mg | Freq: Once | INTRAVENOUS | Status: AC
  Administered 2018-01-21: 0.4 mg via INTRAVENOUS

## 2018-01-21 MED ORDER — TECHNETIUM TC 99M TETROFOSMIN IV KIT
11.0000 | PACK | Freq: Once | INTRAVENOUS | Status: AC | PRN
  Administered 2018-01-21: 11 via INTRAVENOUS
  Filled 2018-01-21: qty 11

## 2018-01-21 NOTE — Telephone Encounter (Signed)
Sent. Thanks.  Okay to continue given her situation with her back.   [place] database reviewed, appropriate.

## 2018-01-21 NOTE — Progress Notes (Signed)
Patient ID: Maria [name]                 DOB: Jan 18, 1941                    MRN: [id number]     HPI: Maria [name] is a 77 y.o. female patient of Dr [name] referred to lipid clinic by Bettina Gavia, PA. PMH is significant for CAD s/p DES to [facility] 01/2015, HTN, DM, angina, GERD, HLD, and SVT s/p ablation in 2016. She has previously been seen by me in lipid clinic in 2018 and was approved for Repatha and started therapy. Copay was afforable at $100 per month. She called clinic ~8 months after starting therapy to report that her voice was being affected by the Repatha and that her voice kept coming and going. She was not short of breath. Phone note December 2018 states pt was switched to Praluent to see if she would tolerate this better.   Pt presents today in good spirits. She reports her voice has remained affected even being off of Repatha for the past 9 months. She reports she never started on Praluent therapy. Phone note 05/10/17 shows that prior authorization was not needed however I do not see that a prescription was ever sent to a pharmacy and pt does not report picking up any samples.   Pt has also previously taken pravastatin 20mg  daily, Crestor 5mg  every other day and 3x per week, and Vytorin. She experienced generalized muscle aches that felt like the flu on each statin. Symptoms would present after about 1 week of therapy and would resolve within a few weeks of statin discontinuation each time. She follows a low fat diet and tries to stay active.  Current Medications: none Intolerances: pravastatin 20mg  daily - myalgias, Crestor 5mg  every other day and 3x per week - myalgias, Vytorin - myalgias, Repatha - affected voice Risk Factors: CAD s/p PCI, DM, angina LDL goal: 70mg /dL  Diet: Cereal or toast for breakfast with coffee. Lunch - peanut butter crackers, apple, or leftovers. Dinner- protein and vegetable. Does not eat meat frequently. Does not fry her food.   Exercise: Stays active  caring for her husband and doing yard work. Wants to start walking more.  Family History: The patient's family history includes Diabetes in her brother, maternal grandmother, paternal grandfather, paternal grandmother, and sister; Heart disease in her maternal grandfather and mother; Hypertension in her maternal grandfather, maternal grandmother, mother, paternal grandfather, and paternal grandmother; Myasthenia gravis in her sister; Stroke in her maternal grandmother.  Social History: The patient reports that she has never smoked. She has never used smokeless tobacco. She reports that she does not drink alcohol or use drugs.  Labs: 10/26/16: TC 106, TG 88, HDL 45, LDL 43  (Repatha 140mg  Q2W) 07/05/2016: TC 192, TG 92, HDL 38.6, LDL 135 (no therapy)  Past Medical History:  Diagnosis Date  . Anemia 05/1985  . Carotid artery disease (HCC)    a. 02/2015 - 1-39% bilaterally.  . Chronic back pain   . Concussion   . Coronary artery disease    a. s/p DES to [facility] 01/2015.  . DDD (degenerative disc disease), cervical   . DDD (degenerative disc disease), lumbar   . Fracture of lower leg 07/2001   Right  . GERD (gastroesophageal reflux disease)   . Hyperlipidemia   . Hypertension   . Multinodular thyroid   . Pneumonia ?1990's X 1  . PSVT (paroxysmal supraventricular tachycardia) (  HCC)    a. s/p catheter ablation of a parahisian atrial tachycardia which was successfully ablated from the non-coronary cusp of the aortic root 06/2014.  [name] Renal cyst 07/10/2006   bilateral  . Right knee pain 02/2010   Injected - Dr. Shelle Iron  . Sinus pause    a. By event monitoring 09/2014 - BB stopped at that time.  . Syncope and collapse 07/2001   "that's when I broke my leg"  . Type II diabetes mellitus (HCC) 2002    Current Outpatient Medications on File Prior to Visit  Medication Sig Dispense Refill  . aspirin EC 81 MG tablet Take 81 mg by mouth once.     [name] atenolol (TENORMIN) 25 MG tablet TAKE 1/2 TABLET  (12.5 MG TOTAL) BY MOUTH DAILY. 45 tablet 0  . glucose blood (ONE TOUCH ULTRA TEST) test strip USE AS INSTRUCTED TO TEST BLOOD SUGAR THREE TIMES DAILY. 300 each 1  . glyBURIDE (DIABETA) 5 MG tablet Take 1-2 tablets (5-10 mg total) by mouth daily with breakfast. 60 tablet 5  . insulin degludec (TRESIBA FLEXTOUCH) 100 UNIT/ML SOPN FlexTouch Pen Inject 0.14 mLs (14 Units total) into the skin daily at 10 pm. 5 pen 11  . Insulin Pen Needle (PEN NEEDLES) 32G X 4 MM MISC 1 each by Does not apply route daily. 100 each 3  . lidocaine (LIDODERM) 5 % Place 1 patch onto the skin daily. Remove & Discard patch within 12 hours or as directed by MD 30 patch 0  . loperamide (IMODIUM) 2 MG capsule Take 1 mg by mouth as needed for diarrhea or loose stools.    [name] losartan (COZAAR) 25 MG tablet Take 1 tablet (25 mg total) by mouth daily. Please make an appt with Dr. [name] for October for future refills. 90 tablet 0  . nitroGLYCERIN (NITROSTAT) 0.4 MG SL tablet PLACE 1 TABLET (0.4 MG TOTAL) UNDER THE TONGUE EVERY 5 (FIVE) MINUTES AS NEEDED FOR CHEST PAIN. 25 tablet 1  . omeprazole (PRILOSEC) 20 MG capsule Take 1 capsule (20 mg total) by mouth 2 (two) times daily. 60 capsule 5  . oxyCODONE (OXY IR/ROXICODONE) 5 MG immediate release tablet TAKE 1/2 TO 1 TABLET BY MOUTH EVERY 6 HOURS AS NEEDED FOR SEVERE PAIN. 30 tablet 0   No current facility-administered medications on file prior to visit.     Allergies  Allergen Reactions  . Acetaminophen     REACTION: jittery with plain tylenol, but tolerated tylenol/benadryl combination  . Carvedilol     REACTION: nightmares  . Crestor [Rosuvastatin]     aches  . Ezetimibe-Simvastatin     REACTION: muscle aches  . Gabapentin Other (See Comments)    diarrhea  . Glipizide Other (See Comments)    GI intolerance.    . Ibuprofen Other (See Comments)    REACTION: GI upset at high doses  . Insulin Glargine     Rash  . Levemir [Insulin Detemir]     rash  . Macrobid  [Nitrofurantoin Macrocrystal] Other (See Comments)    Achey, N & V  . Metformin     REACTION: GI upset  . Metoprolol Tartrate     REACTION: nightmares  . Naproxen     REACTION: GI upset  . Nsaids   . Pravastatin Other (See Comments)    Myalgias    Assessment/Plan:  1. Hyperlipidemia - LDL previously at goal < 70 when pt took Repatha, however reports this adversely affected her voice. She was supposed to  trial Praluent at the end of last year but rx was never sent in and pt did not start therapy. Pt provided with 1 sample today and rx sent to local pharmacy. Confirmed copay is affordable at $100 per month and rx does not require prior authorization. Scheduled f/u lab work after 3 injections to assess efficacy of Praluent. Advised pt to call clinic with any adverse events.  Craven Crean E. Shakena Callari, PharmD, BCACP, CPP [facility] [address] [place], [place] [address] Phone: [phone]; Fax: [phone] 01/21/2018 2:12 PM

## 2018-01-21 NOTE — Patient Instructions (Signed)
It was nice to see you today  Start Praluent injections every 2 weeks  [facility] is ordering your Praluent - 1 month supply will be $100  We will recheck your cholesterol on Wednesday, October 16th. Come in any time after 7:30am for fasting lab work  Call Aundra Millet or Nicholaus Bloom (pharmacists) with any problems #[phone]

## 2018-01-21 NOTE — Telephone Encounter (Addendum)
Name of Medication: Oxycodone Name of Pharmacy: [facility] Last Fill or Written Date and Quantity: 01/09/18 #30 Last Office Visit and Type: 12/25/17 Next Office Visit and Type: None scheduled Last Controlled Substance Agreement Date: none Last UDS: none

## 2018-01-22 ENCOUNTER — Telehealth: Payer: Self-pay | Admitting: Physician Assistant

## 2018-01-22 ENCOUNTER — Other Ambulatory Visit: Payer: Self-pay | Admitting: Family Medicine

## 2018-01-22 NOTE — Telephone Encounter (Signed)
Electronic refill request Last office visit 12/25/17 Last refill 06/25/17 #60/5 See allergy/contraindication

## 2018-01-22 NOTE — Telephone Encounter (Signed)
   Primary Cardiologist: Charlton Haws, MD  Chart reviewed as part of pre-operative protocol coverage. Patient was contacted 01/22/2018 in reference to pre-operative risk assessment for pending surgery as outlined below.  Maria [name] was last seen on 01/17/18 by Micah Flesher, PAC.  During that visit, she complained of 1 year history of chest pain that she could not differentiate from her ongoing back pain. She underwent nuclear stress test which was negative for new reversible ischemia. Based on these results, she is at acceptable risk to proceed with surgery.  Therefore, based on ACC/AHA guidelines, the patient would be at acceptable risk for the planned procedure without further cardiovascular testing.   I will route this recommendation to the requesting party via Epic fax function and remove from pre-op pool.  Please call with questions.  Roe Rutherford Tuwana Kapaun, PA 01/22/2018, 11:43 AM

## 2018-01-23 NOTE — Telephone Encounter (Signed)
Sent. Thanks.  Okay to continue. 

## 2018-01-24 NOTE — Pre-Procedure Instructions (Signed)
RESHA FILIPPONE  01/24/2018      [facility] - [place], [place] - [address]e Round [place] [address] Phone: [phone] Fax: [phone]  [facility] - [place], [place] - [address] [place] [address] Phone: [phone] Fax: [phone]    Your procedure is scheduled on February 04, 2018.  Report to [facility] Admitting at 530 AM.  Call this number if you have problems the morning of surgery:  [phone]   Remember:  Do not eat or drink after midnight.    Take these medicines the morning of surgery with A SIP OF WATER  Atenolol (tenormin) Omeprazole (prilosec) Oxycodone OR Tramadol (ultram)-if needed for pain Nitrostat-if needed for chest pain  Follow your surgeon's instructions on when to hold/resume aspirin.  If no instructions were given call the office to determine how they would like to you take aspirin   7 days prior to surgery STOP taking any Aleve, Naproxen, Ibuprofen, Motrin, Advil, Goody's, BC's, all herbal medications, fish oil, and all vitamins  WHAT DO I DO ABOUT MY DIABETES MEDICATION?  [name] Do not take oral diabetes medicines (pills) the morning of surgery-gylburide (diabeta)/  . THE NIGHT BEFORE SURGERY, take 10 units of Tresiba insulin (1/2 of your normal dose).       Reviewed and Endorsed by [facility] Patient Education Committee, August 2015  How to Manage Your Diabetes Before and After Surgery  Why is it important to control my blood sugar before and after surgery? . Improving blood sugar levels before and after surgery helps healing and can limit problems. . A way of improving blood sugar control is eating a healthy diet by: o  Eating less sugar and carbohydrates o  Increasing activity/exercise o  Talking with your doctor about reaching your blood sugar goals . High blood sugars (greater than 180 mg/dL) can raise your  risk of infections and slow your recovery, so you will need to focus on controlling your diabetes during the weeks before surgery. . Make sure that the doctor who takes care of your diabetes knows about your planned surgery including the date and location.  How do I manage my blood sugar before surgery? . Check your blood sugar at least 4 times a day, starting 2 days before surgery, to make sure that the level is not too high or low. o Check your blood sugar the morning of your surgery when you wake up and every 2 hours until you get to the Short Stay unit. . If your blood sugar is less than 70 mg/dL, you will need to treat for low blood sugar: o Do not take insulin. o Treat a low blood sugar (less than 70 mg/dL) with  cup of clear juice (cranberry or apple), 4 glucose tablets, OR glucose gel. Recheck blood sugar in 15 minutes after treatment (to make sure it is greater than 70 mg/dL). If your blood sugar is not greater than 70 mg/dL on recheck, call [phone] o  for further instructions. . Report your blood sugar to the short stay nurse when you get to Short Stay.  . If you are admitted to the hospital after surgery: o Your blood sugar will be checked by the staff and you will probably be given insulin after surgery (instead of oral diabetes medicines) to make sure you have good blood sugar levels. o The goal for blood sugar  control after surgery is 80-180 mg/dL.    Do not wear jewelry, make-up or nail polish.  Do not wear lotions, powders, or perfumes, or deodorant.  Do not shave 48 hours prior to surgery.  Men may shave face and neck.  Do not bring valuables to the hospital.  [facility] is not responsible for any belongings or valuables.  Contacts, dentures or bridgework may not be worn into surgery.  Leave your suitcase in the car.  After surgery it may be brought to your room.  For patients admitted to the hospital, discharge time will be determined by your treatment  team.  Patients discharged the day of surgery will not be allowed to drive home.    [facility]- Preparing For Surgery  Before surgery, you can play an important role. Because skin is not sterile, your skin needs to be as free of germs as possible. You can reduce the number of germs on your skin by washing with CHG (chlorahexidine gluconate) Soap before surgery.  CHG is an antiseptic cleaner which kills germs and bonds with the skin to continue killing germs even after washing.    Oral Hygiene is also important to reduce your risk of infection.  Remember - BRUSH YOUR TEETH THE MORNING OF SURGERY WITH YOUR REGULAR TOOTHPASTE  Please do not use if you have an allergy to CHG or antibacterial soaps. If your skin becomes reddened/irritated stop using the CHG.  Do not shave (including legs and underarms) for at least 48 hours prior to first CHG shower. It is OK to shave your face.  Please follow these instructions carefully.   1. Shower the NIGHT BEFORE SURGERY and the MORNING OF SURGERY with CHG.   2. If you chose to wash your hair, wash your hair first as usual with your normal shampoo.  3. After you shampoo, rinse your hair and body thoroughly to remove the shampoo.  4. Use CHG as you would any other liquid soap. You can apply CHG directly to the skin and wash gently with a scrungie or a clean washcloth.   5. Apply the CHG Soap to your body ONLY FROM THE NECK DOWN.  Do not use on open wounds or open sores. Avoid contact with your eyes, ears, mouth and genitals (private parts). Wash Face and genitals (private parts)  with your normal soap.  6. Wash thoroughly, paying special attention to the area where your surgery will be performed.  7. Thoroughly rinse your body with warm water from the neck down.  8. DO NOT shower/wash with your normal soap after using and rinsing off the CHG Soap.  9. Pat yourself dry with a CLEAN TOWEL.  10. Wear CLEAN PAJAMAS to bed the night before surgery, wear  comfortable clothes the morning of surgery  11. Place CLEAN SHEETS on your bed the night of your first shower and DO NOT SLEEP WITH PETS.  Day of Surgery:  Do not apply any deodorants/lotions.  Please wear clean clothes to the hospital/surgery center.   Remember to brush your teeth WITH YOUR REGULAR TOOTHPASTE.  Please read over the following fact sheets that you were given.

## 2018-01-25 ENCOUNTER — Encounter (HOSPITAL_COMMUNITY)
Admission: RE | Admit: 2018-01-25 | Discharge: 2018-01-25 | Disposition: A | Discharge status: Home / Self Care | Payer: Medicare Other | Source: Ambulatory Visit | Attending: Neurological Surgery | Admitting: Neurological Surgery

## 2018-01-25 ENCOUNTER — Encounter (HOSPITAL_COMMUNITY): Payer: Self-pay

## 2018-01-25 ENCOUNTER — Other Ambulatory Visit: Payer: Self-pay

## 2018-01-25 DIAGNOSIS — Z01812 Encounter for preprocedural laboratory examination: Secondary | ICD-10-CM | POA: Insufficient documentation

## 2018-01-25 LAB — HEMOGLOBIN A1C
Hgb A1c MFr Bld: 8.6 % — ABNORMAL HIGH (ref 4.8–5.6)
Mean Plasma Glucose: 200.12 mg/dL

## 2018-01-25 LAB — SURGICAL PCR SCREEN
MRSA, PCR: NEGATIVE
Staphylococcus aureus: NEGATIVE

## 2018-01-25 LAB — BASIC METABOLIC PANEL
Anion gap: 9 (ref 5–15)
BUN: 15 mg/dL (ref 8–23)
CHLORIDE: 109 mmol/L (ref 98–111)
CO2: 22 mmol/L (ref 22–32)
Calcium: 9 mg/dL (ref 8.9–10.3)
Creatinine, Ser: 0.96 mg/dL (ref 0.44–1.00)
GFR calc Af Amer: >60 mL/min (ref >60)
GFR calc non Af Amer: 55 mL/min — ABNORMAL LOW (ref >60)
GLUCOSE: 147 mg/dL — AB (ref 70–99)
Potassium: 4.5 mmol/L (ref 3.5–5.1)
SODIUM: 140 mmol/L (ref 135–145)

## 2018-01-25 LAB — CBC
HCT: 38.9 % (ref 36.0–46.0)
Hemoglobin: 12.7 g/dL (ref 12.0–15.0)
MCH: 29.6 pg (ref 26.0–34.0)
MCHC: 32.6 g/dL (ref 30.0–36.0)
MCV: 90.7 fL (ref 78.0–100.0)
Platelets: 362 10*3/uL (ref 150–400)
RBC: 4.29 MIL/uL (ref 3.87–5.11)
RDW: 14.2 % (ref 11.5–15.5)
WBC: 9.4 10*3/uL (ref 4.0–10.5)

## 2018-01-25 LAB — TYPE AND SCREEN
ABO/RH(D): O NEG
ANTIBODY SCREEN: NEGATIVE

## 2018-01-25 LAB — GLUCOSE, CAPILLARY: GLUCOSE-CAPILLARY: 159 mg/dL — AB (ref 70–99)

## 2018-01-25 LAB — ABO/RH: ABO/RH(D): O NEG

## 2018-01-25 NOTE — Progress Notes (Signed)
PCP: Crawford GivensGraham Duncan, MD  Cardiologist:  Charlton HawsPeter [name], MD  EKG: 01/17/18 in EPIC  Stress test: 01/2018 in EPIC  ECHO: 10/2014 in EPIC  Cardiac Cath: 06/10/15 in EPIC  Chest x-ray: denies, no recent respiratory infections/complications

## 2018-01-28 NOTE — Progress Notes (Signed)
Anesthesia Chart Review:  Case:  [id number] Date/Time:  02/04/18 0715   Procedures:      Lumbar 2-3 Lumbar 3-4 Lumbar 4-5 Anterolateral decompression/percutaneous posterior arthrodesis, Mazor (N/A ) - Lumbar 2-3 Lumbar 3-4 Lumbar 4-5 Anterolateral decompression/percutaneous posterior arthrodesis, Mazor     LUMBAR PERCUTANEOUS PEDICLE SCREW 3 LEVEL (N/A )     APPLICATION OF ROBOTIC ASSISTANCE FOR SPINAL PROCEDURE (N/A )   Anesthesia type:  General   Pre-op diagnosis:  Lumbar stenosis with neurogenic claudication   Location:  [facility] ROOM 21 / [facility] OR   Surgeon:  Barnett AbuElsner, Henry, MD      DISCUSSION: Patient is a 77 year old female scheduled for the above procedure. History includes never smoker, HTN, HLD, GERD, anemia, PSVT (s/p catheter ablation 07/06/14), sinus pause (09/2014, b-blocker discontinued), CAD (s/p DES LCX 01/2015), DM2, carotid stenosis (1-39% 02/2015), multinodular thyroid.  She has preoperative cardiology input. See below. Patient told to follow-up regarding perioperative ASA instructions, and I also left a voice message with Shanda BumpsJessica at Dr. Verlee RossettiElsner's office to follow-up with patient regarding this and also notifying her of A1c of 8.6.   Based on available information, I would anticipate that she can proceed as planned. Fasting CBG on arrival.    VS: BP 126/61   Pulse 74   Temp 37.1 C (Oral)   Resp 18   Ht 5\' 5"  (1.651 m)   Wt 75.3 kg   SpO2 98%   BMI 27.62 kg/m   PROVIDERS: Joaquim Namuncan, Graham S, MD is PCP Charlton HawsNishan, Peter, MD is cardiologist. According to 01/22/18 telephone encounter by Micah Flesheruke, Angela, PA-C, "Maria ManuelBarbara D [name] was last seen on 01/17/18 by Micah FlesherAngela Duke, PAC.  During that visit, she complained of 1 year history of chest pain that she could not differentiate from her ongoing back pain. She underwent nuclear stress test which was negative for new reversible ischemia. Based on these results, she is at acceptable risk to proceed with surgery. Therefore, based on ACC/AHA guidelines, the  patient would be at acceptable risk for the planned procedure without further cardiovascular testing." Six month cardiology follow-up recommended.    LABS: Preoperative labs noted. A1c 8.6, routed to surgeon.  (all labs ordered are listed, but only abnormal results are displayed)  Labs Reviewed  GLUCOSE, CAPILLARY - Abnormal; Notable for the following components:      Result Value   Glucose-Capillary 159 (*)    All other components within normal limits  BASIC METABOLIC PANEL - Abnormal; Notable for the following components:   Glucose, Bld 147 (*)    GFR calc non Af Amer 55 (*)    All other components within normal limits  HEMOGLOBIN A1C - Abnormal; Notable for the following components:   Hgb A1c MFr Bld 8.6 (*)    All other components within normal limits  SURGICAL PCR SCREEN  CBC  TYPE AND SCREEN  ABO/RH     IMAGES: CT L-spine: Scheduled for 01/29/18.  MRI L-spine 11/04/17: IMPRESSION: - Severe subarticular foraminal stenosis on the left at L1-2 due to spurring is unchanged from the prior study. - Moderate spinal stenosis L2-3 unchanged - Left laminectomy L3-4 with mild subarticular stenosis bilaterally L3-4 - Mild spinal stenosis L4-5. Severe right foraminal encroachment unchanged from the prior study. - Overall imaging findings are unchanged from the prior MRI.   EKG: 01/17/18: NSR, possible LAE, LAD, right BBB.   CV: Nuclear stress test 01/21/18:  Nuclear stress EF: 75%.  Probable normal perfusion and mild soft tissue  attenuation (diaphragm) No ischemia or scar.  This is a low risk study.  Echo 11/03/14: Impressions: - Normal LV size with mild LV hypertrophy, EF 60-65%. Normal RV   size and systolic function. No significant valvular   abnormalities.  Cardiac cath 06/10/15:  Prox RCA lesion, 30% stenosed.  1st Diag lesion, 35% stenosed.  Previously placed Prox Cx to Mid Cx DES is patent.  The left ventricular systolic function is normal.  1.  Nonobstructive CAD. Continued patency of the stent in the LCx. 2. Normal LV function. Continue medical therapy. Consider switching Brilinta to alternative antiplatelet agent.  Carotid U/S 03/11/15: Heterogeneous plaque, bilaterally. 1-39% ICA stenosis, bilterally. Normal subclavian arteries, bilaterally. Patent vertebral arteries with antegrade flow. F/U PRN.  Cardiac Event Monitor 10/08/14-11/04/17:  The patient was monitored for 685:34 hours, of which 372:36 hours were usable. AHR for monitored period was 85 bpm. Tachycardia was present for 15% of the readable data; Bradycardia was present for < 1% of the readable data. 1 Pause of 3 seconds or longer. PACs. PVCs. Patient transmitted 15 manually-triggered recordings and reported symptoms of chest pain, rapid HR/palp/flutter, dizziness/lightheaded. Immediate notification was made for 1 episode (atenolol discontinued after pause).   Past Medical History:  Diagnosis Date  . Anemia 05/1985  . Carotid artery disease (HCC)    a. 02/2015 - 1-39% bilaterally.  . Chronic back pain   . Concussion   . Coronary artery disease    a. s/p DES to [facility] 01/2015.  . DDD (degenerative disc disease), cervical   . DDD (degenerative disc disease), lumbar   . Fracture of lower leg 07/2001   Right  . GERD (gastroesophageal reflux disease)   . Hyperlipidemia   . Hypertension   . Multinodular thyroid   . Pneumonia ?1990's X 1  . PSVT (paroxysmal supraventricular tachycardia) (HCC)    a. s/p catheter ablation of a parahisian atrial tachycardia which was successfully ablated from the non-coronary cusp of the aortic root 06/2014.  [name] Renal cyst 07/10/2006   bilateral  . Right knee pain 02/2010   Injected - Dr. Shelle Iron  . Sinus pause    a. By event monitoring 09/2014 - BB stopped at that time.  . Syncope and collapse 07/2001   "that's when I broke my leg"  . Type II diabetes mellitus (HCC) 2002    Past Surgical History:  Procedure Laterality Date  . ANTERIOR  CERVICAL DECOMP/DISCECTOMY FUSION  10/03/2005   C4/5; C5/6  "it's got a plate in there"  . BACK SURGERY    . BREAST BIOPSY Bilateral 1968 (multiple)   "all benign"  . BREAST LUMPECTOMY Bilateral 1968  . CARDIAC CATHETERIZATION N/A 11/18/2014   Procedure: Left Heart Cath and Coronary Angiography;  Surgeon: Peter M Swaziland, MD;  Location: [facility] INVASIVE CV LAB;  Service: Cardiovascular;  Laterality: N/A;  . CARDIAC CATHETERIZATION N/A 01/22/2015   Procedure: Coronary Stent Intervention;  Surgeon: Peter M Swaziland, MD;  Location: [facility] INVASIVE CV LAB;  Service: Cardiovascular;  Laterality: N/A;  . CARDIAC CATHETERIZATION N/A 06/10/2015   Procedure: Left Heart Cath and Coronary Angiography;  Surgeon: Peter M Swaziland, MD;  Location: [facility] INVASIVE CV LAB;  Service: Cardiovascular;  Laterality: N/A;  . CATARACT EXTRACTION W/ INTRAOCULAR LENS  IMPLANT, BILATERAL Bilateral 12/2010  . CORONARY ANGIOPLASTY    . ESOPHAGOGASTRODUODENOSCOPY  08/14/2005   gastropathy biopsy, negative  . LAPAROSCOPIC CHOLECYSTECTOMY  08/1992  . POSTERIOR LAMINECTOMY / DECOMPRESSION LUMBAR SPINE  09/2001   due to herniated disc  .  SUPRAVENTRICULAR TACHYCARDIA ABLATION N/A 07/06/2014   Procedure: SUPRAVENTRICULAR TACHYCARDIA ABLATION;  Surgeon: Marinus Maw, MD;  Location: [facility] CATH LAB;  Service: Cardiovascular;  Laterality: N/A;  . VAGINAL HYSTERECTOMY  1968   spotting    MEDICATIONS: . Alirocumab (PRALUENT) 75 MG/ML SOPN  . aspirin EC 81 MG tablet  . atenolol (TENORMIN) 25 MG tablet  . glucose blood (ONE TOUCH ULTRA TEST) test strip  . glyBURIDE (DIABETA) 5 MG tablet  . insulin degludec (TRESIBA FLEXTOUCH) 100 UNIT/ML SOPN FlexTouch Pen  . Insulin Pen Needle (PEN NEEDLES) 32G X 4 MM MISC  . lidocaine (LIDODERM) 5 %  . loperamide (IMODIUM) 2 MG capsule  . losartan (COZAAR) 25 MG tablet  . nitroGLYCERIN (NITROSTAT) 0.4 MG SL tablet  . omeprazole (PRILOSEC) 20 MG capsule  . oxyCODONE (OXY IR/ROXICODONE) 5 MG immediate release  tablet  . traMADol (ULTRAM) 50 MG tablet   No current facility-administered medications for this encounter.     Velna Ochs [facility] Short Stay Center/Anesthesiology Phone [phone] 01/28/2018 1:25 PM

## 2018-01-29 ENCOUNTER — Ambulatory Visit (HOSPITAL_COMMUNITY)
Admission: RE | Admit: 2018-01-29 | Discharge: 2018-01-29 | Disposition: A | Discharge status: Home / Self Care | Payer: Medicare Other | Source: Ambulatory Visit | Attending: Neurological Surgery | Admitting: Neurological Surgery

## 2018-01-29 DIAGNOSIS — M48062 Spinal stenosis, lumbar region with neurogenic claudication: Secondary | ICD-10-CM | POA: Insufficient documentation

## 2018-01-29 DIAGNOSIS — M5136 Other intervertebral disc degeneration, lumbar region: Secondary | ICD-10-CM | POA: Diagnosis not present

## 2018-02-04 ENCOUNTER — Inpatient Hospital Stay (HOSPITAL_COMMUNITY): Payer: Medicare Other

## 2018-02-04 ENCOUNTER — Inpatient Hospital Stay (HOSPITAL_COMMUNITY)
Admission: RE | Admit: 2018-02-04 | Discharge: 2018-02-07 | DRG: 458 | Disposition: A | Discharge status: Home / Self Care | Payer: Medicare Other | Source: Ambulatory Visit | Attending: Neurological Surgery | Admitting: Neurological Surgery

## 2018-02-04 ENCOUNTER — Inpatient Hospital Stay (HOSPITAL_COMMUNITY): Payer: Medicare Other | Admitting: Vascular Surgery

## 2018-02-04 ENCOUNTER — Inpatient Hospital Stay (HOSPITAL_COMMUNITY)
Admission: RE | Disposition: A | Discharge status: Home / Self Care | Payer: Self-pay | Source: Ambulatory Visit | Attending: Neurological Surgery

## 2018-02-04 ENCOUNTER — Encounter (HOSPITAL_COMMUNITY): Payer: Self-pay

## 2018-02-04 ENCOUNTER — Inpatient Hospital Stay (HOSPITAL_COMMUNITY): Payer: Medicare Other | Admitting: Certified Registered"

## 2018-02-04 DIAGNOSIS — K219 Gastro-esophageal reflux disease without esophagitis: Secondary | ICD-10-CM | POA: Diagnosis present

## 2018-02-04 DIAGNOSIS — M4156 Other secondary scoliosis, lumbar region: Secondary | ICD-10-CM | POA: Diagnosis present

## 2018-02-04 DIAGNOSIS — E119 Type 2 diabetes mellitus without complications: Secondary | ICD-10-CM | POA: Diagnosis present

## 2018-02-04 DIAGNOSIS — Z7982 Long term (current) use of aspirin: Secondary | ICD-10-CM | POA: Diagnosis not present

## 2018-02-04 DIAGNOSIS — Z886 Allergy status to analgesic agent status: Secondary | ICD-10-CM

## 2018-02-04 DIAGNOSIS — I10 Essential (primary) hypertension: Secondary | ICD-10-CM | POA: Diagnosis present

## 2018-02-04 DIAGNOSIS — Z881 Allergy status to other antibiotic agents status: Secondary | ICD-10-CM | POA: Diagnosis not present

## 2018-02-04 DIAGNOSIS — Z79899 Other long term (current) drug therapy: Secondary | ICD-10-CM

## 2018-02-04 DIAGNOSIS — I2510 Atherosclerotic heart disease of native coronary artery without angina pectoris: Secondary | ICD-10-CM | POA: Diagnosis present

## 2018-02-04 DIAGNOSIS — F329 Major depressive disorder, single episode, unspecified: Secondary | ICD-10-CM | POA: Diagnosis present

## 2018-02-04 DIAGNOSIS — Z794 Long term (current) use of insulin: Secondary | ICD-10-CM | POA: Diagnosis not present

## 2018-02-04 DIAGNOSIS — Z888 Allergy status to other drugs, medicaments and biological substances status: Secondary | ICD-10-CM | POA: Diagnosis not present

## 2018-02-04 DIAGNOSIS — M48061 Spinal stenosis, lumbar region without neurogenic claudication: Secondary | ICD-10-CM | POA: Diagnosis present

## 2018-02-04 DIAGNOSIS — Z955 Presence of coronary angioplasty implant and graft: Secondary | ICD-10-CM | POA: Diagnosis not present

## 2018-02-04 DIAGNOSIS — E785 Hyperlipidemia, unspecified: Secondary | ICD-10-CM | POA: Diagnosis present

## 2018-02-04 DIAGNOSIS — M4726 Other spondylosis with radiculopathy, lumbar region: Secondary | ICD-10-CM | POA: Diagnosis present

## 2018-02-04 DIAGNOSIS — M5417 Radiculopathy, lumbosacral region: Secondary | ICD-10-CM | POA: Diagnosis present

## 2018-02-04 DIAGNOSIS — Z419 Encounter for procedure for purposes other than remedying health state, unspecified: Secondary | ICD-10-CM

## 2018-02-04 HISTORY — PX: ANTERIOR LAT LUMBAR FUSION: SHX1168

## 2018-02-04 HISTORY — PX: LUMBAR PERCUTANEOUS PEDICLE SCREW 3 LEVEL: SHX5562

## 2018-02-04 HISTORY — PX: APPLICATION OF ROBOTIC ASSISTANCE FOR SPINAL PROCEDURE: SHX6753

## 2018-02-04 LAB — GLUCOSE, CAPILLARY
GLUCOSE-CAPILLARY: 286 mg/dL — AB (ref 70–99)
GLUCOSE-CAPILLARY: 138 mg/dL — AB (ref 70–99)
Glucose-Capillary: 182 mg/dL — ABNORMAL HIGH (ref 70–99)
Glucose-Capillary: 197 mg/dL — ABNORMAL HIGH (ref 70–99)
Glucose-Capillary: 272 mg/dL — ABNORMAL HIGH (ref 70–99)

## 2018-02-04 LAB — CBC
HCT: 36.4 % (ref 36.0–46.0)
Hemoglobin: 11.9 g/dL — ABNORMAL LOW (ref 12.0–15.0)
MCH: 29.5 pg (ref 26.0–34.0)
MCHC: 32.7 g/dL (ref 30.0–36.0)
MCV: 90.3 fL (ref 78.0–100.0)
Platelets: 283 10*3/uL (ref 150–400)
RBC: 4.03 MIL/uL (ref 3.87–5.11)
RDW: 13.9 % (ref 11.5–15.5)
WBC: 15 10*3/uL — AB (ref 4.0–10.5)

## 2018-02-04 LAB — CREATININE, SERUM
Creatinine, Ser: 1 mg/dL (ref 0.44–1.00)
GFR calc non Af Amer: 53 mL/min — ABNORMAL LOW (ref >60)

## 2018-02-04 SURGERY — ANTERIOR LATERAL LUMBAR FUSION 3 LEVELS
Anesthesia: General

## 2018-02-04 MED ORDER — FENTANYL CITRATE (PF) 250 MCG/5ML IJ SOLN
INTRAMUSCULAR | Status: AC
  Filled 2018-02-04: qty 5

## 2018-02-04 MED ORDER — LIDOCAINE-EPINEPHRINE 1 %-1:100000 IJ SOLN
INTRAMUSCULAR | Status: DC | PRN
  Administered 2018-02-04: 15 mL

## 2018-02-04 MED ORDER — KETOROLAC TROMETHAMINE 15 MG/ML IJ SOLN
7.5000 mg | Freq: Four times a day (QID) | INTRAMUSCULAR | Status: AC
  Administered 2018-02-04 – 2018-02-05 (×4): 7.5 mg via INTRAVENOUS
  Filled 2018-02-04 (×3): qty 1

## 2018-02-04 MED ORDER — SODIUM CHLORIDE 0.9 % IV SOLN
INTRAVENOUS | Status: DC | PRN
  Administered 2018-02-04: 40 ug/min via INTRAVENOUS

## 2018-02-04 MED ORDER — CEFAZOLIN SODIUM-DEXTROSE 2-4 GM/100ML-% IV SOLN
2.0000 g | INTRAVENOUS | Status: AC
  Administered 2018-02-04: 2 g via INTRAVENOUS

## 2018-02-04 MED ORDER — SODIUM CHLORIDE 0.9% FLUSH: 3.0000 mL | INTRAVENOUS | Status: DC | PRN

## 2018-02-04 MED ORDER — BISACODYL 10 MG RE SUPP: 10.0000 mg | Freq: Every day | RECTAL | Status: DC | PRN

## 2018-02-04 MED ORDER — NITROGLYCERIN 0.4 MG SL SUBL: 0.4000 mg | SUBLINGUAL_TABLET | SUBLINGUAL | Status: DC | PRN

## 2018-02-04 MED ORDER — LOSARTAN POTASSIUM 25 MG PO TABS
25.0000 mg | ORAL_TABLET | Freq: Every day | ORAL | Status: DC
  Administered 2018-02-04 – 2018-02-06 (×3): 25 mg via ORAL
  Filled 2018-02-04 (×4): qty 1

## 2018-02-04 MED ORDER — SODIUM CHLORIDE 0.9% FLUSH
3.0000 mL | Freq: Two times a day (BID) | INTRAVENOUS | Status: DC
  Administered 2018-02-04 – 2018-02-05 (×2): 3 mL via INTRAVENOUS

## 2018-02-04 MED ORDER — PHENYLEPHRINE 40 MCG/ML (10ML) SYRINGE FOR IV PUSH (FOR BLOOD PRESSURE SUPPORT)
PREFILLED_SYRINGE | INTRAVENOUS | Status: DC | PRN
  Administered 2018-02-04 (×2): 80 ug via INTRAVENOUS
  Administered 2018-02-04: 40 ug via INTRAVENOUS
  Administered 2018-02-04 (×3): 80 ug via INTRAVENOUS

## 2018-02-04 MED ORDER — OXYCODONE HCL 5 MG PO TABS
ORAL_TABLET | ORAL | Status: AC
  Filled 2018-02-04: qty 1

## 2018-02-04 MED ORDER — PHENYLEPHRINE 40 MCG/ML (10ML) SYRINGE FOR IV PUSH (FOR BLOOD PRESSURE SUPPORT)
PREFILLED_SYRINGE | INTRAVENOUS | Status: AC
  Filled 2018-02-04: qty 10

## 2018-02-04 MED ORDER — ALBUMIN HUMAN 5 % IV SOLN
INTRAVENOUS | Status: DC | PRN
  Administered 2018-02-04: 10:00:00 via INTRAVENOUS

## 2018-02-04 MED ORDER — HYDROCODONE-ACETAMINOPHEN 5-325 MG PO TABS: 2.0000 | ORAL_TABLET | ORAL | Status: DC | PRN

## 2018-02-04 MED ORDER — PROPOFOL 10 MG/ML IV BOLUS
INTRAVENOUS | Status: DC | PRN
  Administered 2018-02-04 (×2): 50 mg via INTRAVENOUS
  Administered 2018-02-04: 30 mg via INTRAVENOUS
  Administered 2018-02-04: 50 mg via INTRAVENOUS
  Administered 2018-02-04: 120 mg via INTRAVENOUS

## 2018-02-04 MED ORDER — METHOCARBAMOL 1000 MG/10ML IJ SOLN
500.0000 mg | Freq: Four times a day (QID) | INTRAVENOUS | Status: DC | PRN
  Administered 2018-02-04: 500 mg via INTRAVENOUS
  Filled 2018-02-04 (×2): qty 5

## 2018-02-04 MED ORDER — THROMBIN 5000 UNITS EX SOLR
CUTANEOUS | Status: AC
  Filled 2018-02-04: qty 5000

## 2018-02-04 MED ORDER — HYDROMORPHONE HCL 1 MG/ML IJ SOLN
INTRAMUSCULAR | Status: AC
  Filled 2018-02-04: qty 1

## 2018-02-04 MED ORDER — MENTHOL 3 MG MT LOZG: 1.0000 | LOZENGE | OROMUCOSAL | Status: DC | PRN

## 2018-02-04 MED ORDER — ACETAMINOPHEN 650 MG RE SUPP: 650.0000 mg | RECTAL | Status: DC | PRN

## 2018-02-04 MED ORDER — FLEET ENEMA 7-19 GM/118ML RE ENEM: 1.0000 | ENEMA | Freq: Once | RECTAL | Status: DC | PRN

## 2018-02-04 MED ORDER — CHLORHEXIDINE GLUCONATE CLOTH 2 % EX PADS: 6.0000 | MEDICATED_PAD | Freq: Once | CUTANEOUS | Status: DC

## 2018-02-04 MED ORDER — BUPIVACAINE HCL (PF) 0.5 % IJ SOLN
INTRAMUSCULAR | Status: AC
  Filled 2018-02-04: qty 30

## 2018-02-04 MED ORDER — ONDANSETRON HCL 4 MG/2ML IJ SOLN: 4.0000 mg | Freq: Once | INTRAMUSCULAR | Status: DC | PRN

## 2018-02-04 MED ORDER — OXYCODONE HCL 5 MG PO TABS
2.5000 mg | ORAL_TABLET | Freq: Four times a day (QID) | ORAL | Status: DC | PRN
  Administered 2018-02-04: 5 mg via ORAL

## 2018-02-04 MED ORDER — POLYETHYLENE GLYCOL 3350 17 G PO PACK: 17.0000 g | PACK | Freq: Every day | ORAL | Status: DC | PRN

## 2018-02-04 MED ORDER — OXYCODONE HCL 5 MG PO TABS
5.0000 mg | ORAL_TABLET | ORAL | Status: DC | PRN
  Administered 2018-02-04 – 2018-02-05 (×5): 10 mg via ORAL
  Administered 2018-02-05 (×2): 5 mg via ORAL
  Administered 2018-02-05 – 2018-02-07 (×11): 10 mg via ORAL
  Filled 2018-02-04 (×11): qty 2
  Filled 2018-02-04: qty 1
  Filled 2018-02-04: qty 2
  Filled 2018-02-04: qty 1
  Filled 2018-02-04 (×4): qty 2

## 2018-02-04 MED ORDER — METHOCARBAMOL 500 MG PO TABS
ORAL_TABLET | ORAL | Status: AC
  Filled 2018-02-04: qty 1

## 2018-02-04 MED ORDER — ACETAMINOPHEN 325 MG PO TABS
650.0000 mg | ORAL_TABLET | ORAL | Status: DC | PRN
  Administered 2018-02-06: 650 mg via ORAL
  Filled 2018-02-04: qty 2

## 2018-02-04 MED ORDER — LACTATED RINGERS IV SOLN
INTRAVENOUS | Status: DC
  Administered 2018-02-04: 17:00:00 via INTRAVENOUS

## 2018-02-04 MED ORDER — OXYCODONE-ACETAMINOPHEN 5-325 MG PO TABS: 1.0000 | ORAL_TABLET | ORAL | Status: DC | PRN

## 2018-02-04 MED ORDER — MIDAZOLAM HCL 5 MG/5ML IJ SOLN
INTRAMUSCULAR | Status: DC | PRN
  Administered 2018-02-04 (×2): 1 mg via INTRAVENOUS

## 2018-02-04 MED ORDER — GLYCOPYRROLATE 0.2 MG/ML IJ SOLN
INTRAMUSCULAR | Status: DC | PRN
  Administered 2018-02-04: 0.1 mg via INTRAVENOUS

## 2018-02-04 MED ORDER — LIDOCAINE 2% (20 MG/ML) 5 ML SYRINGE
INTRAMUSCULAR | Status: AC
  Filled 2018-02-04: qty 5

## 2018-02-04 MED ORDER — INSULIN ASPART 100 UNIT/ML ~~LOC~~ SOLN
0.0000 [IU] | Freq: Three times a day (TID) | SUBCUTANEOUS | Status: DC
  Administered 2018-02-04 (×2): 11 [IU] via SUBCUTANEOUS
  Administered 2018-02-05: 3 [IU] via SUBCUTANEOUS
  Administered 2018-02-06 (×2): 4 [IU] via SUBCUTANEOUS
  Administered 2018-02-07: 3 [IU] via SUBCUTANEOUS

## 2018-02-04 MED ORDER — ATENOLOL 25 MG PO TABS
12.5000 mg | ORAL_TABLET | Freq: Every day | ORAL | Status: DC
  Administered 2018-02-05 – 2018-02-07 (×3): 12.5 mg via ORAL
  Filled 2018-02-04 (×3): qty 1

## 2018-02-04 MED ORDER — ROCURONIUM BROMIDE 50 MG/5ML IV SOSY
PREFILLED_SYRINGE | INTRAVENOUS | Status: AC
  Filled 2018-02-04: qty 5

## 2018-02-04 MED ORDER — SUCCINYLCHOLINE CHLORIDE 200 MG/10ML IV SOSY
PREFILLED_SYRINGE | INTRAVENOUS | Status: AC
  Filled 2018-02-04: qty 10

## 2018-02-04 MED ORDER — INSULIN ASPART 100 UNIT/ML ~~LOC~~ SOLN
SUBCUTANEOUS | Status: AC
  Filled 2018-02-04: qty 1

## 2018-02-04 MED ORDER — LACTATED RINGERS IV SOLN
INTRAVENOUS | Status: DC | PRN
  Administered 2018-02-04 (×2): via INTRAVENOUS

## 2018-02-04 MED ORDER — LIDOCAINE-EPINEPHRINE 1 %-1:100000 IJ SOLN
INTRAMUSCULAR | Status: AC
  Filled 2018-02-04: qty 1

## 2018-02-04 MED ORDER — THROMBIN 5000 UNITS EX SOLR
OROMUCOSAL | Status: DC | PRN
  Administered 2018-02-04 (×2): via TOPICAL

## 2018-02-04 MED ORDER — ONDANSETRON HCL 4 MG/2ML IJ SOLN
INTRAMUSCULAR | Status: AC
  Filled 2018-02-04: qty 2

## 2018-02-04 MED ORDER — ONDANSETRON HCL 4 MG PO TABS: 4.0000 mg | ORAL_TABLET | Freq: Four times a day (QID) | ORAL | Status: DC | PRN

## 2018-02-04 MED ORDER — MEPERIDINE HCL 50 MG/ML IJ SOLN: 6.2500 mg | INTRAMUSCULAR | Status: DC | PRN

## 2018-02-04 MED ORDER — PROPOFOL 10 MG/ML IV BOLUS
INTRAVENOUS | Status: AC
  Filled 2018-02-04: qty 20

## 2018-02-04 MED ORDER — INSULIN DEGLUDEC 100 UNIT/ML ~~LOC~~ SOPN: 20.0000 [IU] | PEN_INJECTOR | Freq: Every day | SUBCUTANEOUS | Status: DC

## 2018-02-04 MED ORDER — LIDOCAINE 2% (20 MG/ML) 5 ML SYRINGE
INTRAMUSCULAR | Status: DC | PRN
  Administered 2018-02-04: 100 mg via INTRAVENOUS

## 2018-02-04 MED ORDER — METHOCARBAMOL 500 MG PO TABS
500.0000 mg | ORAL_TABLET | Freq: Four times a day (QID) | ORAL | Status: DC | PRN
  Administered 2018-02-04 – 2018-02-07 (×9): 500 mg via ORAL
  Filled 2018-02-04 (×8): qty 1

## 2018-02-04 MED ORDER — 0.9 % SODIUM CHLORIDE (POUR BTL) OPTIME
TOPICAL | Status: DC | PRN
  Administered 2018-02-04: 1000 mL

## 2018-02-04 MED ORDER — CEFAZOLIN SODIUM-DEXTROSE 2-4 GM/100ML-% IV SOLN
INTRAVENOUS | Status: AC
  Filled 2018-02-04: qty 100

## 2018-02-04 MED ORDER — MIDAZOLAM HCL 2 MG/2ML IJ SOLN
INTRAMUSCULAR | Status: AC
  Filled 2018-02-04: qty 2

## 2018-02-04 MED ORDER — SODIUM CHLORIDE 0.9 % IV SOLN: 250.0000 mL | INTRAVENOUS | Status: DC

## 2018-02-04 MED ORDER — ONDANSETRON HCL 4 MG/2ML IJ SOLN: 4.0000 mg | Freq: Four times a day (QID) | INTRAMUSCULAR | Status: DC | PRN

## 2018-02-04 MED ORDER — GLYCOPYRROLATE PF 0.2 MG/ML IJ SOSY
PREFILLED_SYRINGE | INTRAMUSCULAR | Status: AC
  Filled 2018-02-04: qty 1

## 2018-02-04 MED ORDER — GLYBURIDE 5 MG PO TABS
5.0000 mg | ORAL_TABLET | Freq: Every day | ORAL | Status: DC
  Administered 2018-02-05 – 2018-02-07 (×3): 5 mg via ORAL
  Filled 2018-02-04 (×3): qty 1

## 2018-02-04 MED ORDER — FENTANYL CITRATE (PF) 100 MCG/2ML IJ SOLN
INTRAMUSCULAR | Status: DC | PRN
  Administered 2018-02-04 (×5): 50 ug via INTRAVENOUS
  Administered 2018-02-04: 150 ug via INTRAVENOUS
  Administered 2018-02-04 (×2): 50 ug via INTRAVENOUS

## 2018-02-04 MED ORDER — PANTOPRAZOLE SODIUM 40 MG PO TBEC
40.0000 mg | DELAYED_RELEASE_TABLET | Freq: Every day | ORAL | Status: DC
  Administered 2018-02-05 – 2018-02-07 (×3): 40 mg via ORAL
  Filled 2018-02-04 (×3): qty 1

## 2018-02-04 MED ORDER — CEFAZOLIN SODIUM-DEXTROSE 2-4 GM/100ML-% IV SOLN
2.0000 g | Freq: Three times a day (TID) | INTRAVENOUS | Status: AC
  Administered 2018-02-04 – 2018-02-05 (×2): 2 g via INTRAVENOUS
  Filled 2018-02-04 (×2): qty 100

## 2018-02-04 MED ORDER — THROMBIN (RECOMBINANT) 20000 UNITS EX SOLR
CUTANEOUS | Status: AC
  Filled 2018-02-04: qty 20000

## 2018-02-04 MED ORDER — TRAMADOL HCL 50 MG PO TABS: 50.0000 mg | ORAL_TABLET | Freq: Four times a day (QID) | ORAL | Status: DC | PRN

## 2018-02-04 MED ORDER — KETOROLAC TROMETHAMINE 15 MG/ML IJ SOLN
INTRAMUSCULAR | Status: AC
  Filled 2018-02-04: qty 1

## 2018-02-04 MED ORDER — DOCUSATE SODIUM 100 MG PO CAPS
100.0000 mg | ORAL_CAPSULE | Freq: Two times a day (BID) | ORAL | Status: DC
  Administered 2018-02-05 – 2018-02-07 (×5): 100 mg via ORAL
  Filled 2018-02-04 (×6): qty 1

## 2018-02-04 MED ORDER — HYDROMORPHONE HCL 1 MG/ML IJ SOLN
0.2500 mg | INTRAMUSCULAR | Status: DC | PRN
  Administered 2018-02-04 (×4): 0.5 mg via INTRAVENOUS

## 2018-02-04 MED ORDER — THROMBIN 5000 UNITS EX SOLR
CUTANEOUS | Status: AC
  Filled 2018-02-04: qty 10000

## 2018-02-04 MED ORDER — SUCCINYLCHOLINE CHLORIDE 200 MG/10ML IV SOSY
PREFILLED_SYRINGE | INTRAVENOUS | Status: DC | PRN
  Administered 2018-02-04: 120 mg via INTRAVENOUS

## 2018-02-04 MED ORDER — ONDANSETRON HCL 4 MG/2ML IJ SOLN
INTRAMUSCULAR | Status: DC | PRN
  Administered 2018-02-04: 4 mg via INTRAVENOUS

## 2018-02-04 MED ORDER — BUPIVACAINE HCL (PF) 0.5 % IJ SOLN
INTRAMUSCULAR | Status: DC | PRN
  Administered 2018-02-04: 15 mL

## 2018-02-04 MED ORDER — SODIUM CHLORIDE 0.9 % IV SOLN
INTRAVENOUS | Status: DC | PRN
  Administered 2018-02-04: 07:00:00

## 2018-02-04 MED ORDER — DEXAMETHASONE SODIUM PHOSPHATE 4 MG/ML IJ SOLN
INTRAMUSCULAR | Status: DC | PRN
  Administered 2018-02-04: 8 mg via INTRAVENOUS

## 2018-02-04 MED ORDER — ENOXAPARIN SODIUM 40 MG/0.4ML ~~LOC~~ SOLN
40.0000 mg | SUBCUTANEOUS | Status: DC
  Administered 2018-02-05 – 2018-02-07 (×3): 40 mg via SUBCUTANEOUS
  Filled 2018-02-04 (×3): qty 0.4

## 2018-02-04 MED ORDER — SENNA 8.6 MG PO TABS
1.0000 | ORAL_TABLET | Freq: Two times a day (BID) | ORAL | Status: DC
  Administered 2018-02-05 – 2018-02-07 (×5): 8.6 mg via ORAL
  Filled 2018-02-04 (×6): qty 1

## 2018-02-04 MED ORDER — DEXAMETHASONE SODIUM PHOSPHATE 10 MG/ML IJ SOLN
INTRAMUSCULAR | Status: AC
  Filled 2018-02-04: qty 1

## 2018-02-04 MED ORDER — LOPERAMIDE HCL 2 MG PO CAPS
2.0000 mg | ORAL_CAPSULE | Freq: Four times a day (QID) | ORAL | Status: DC | PRN
  Filled 2018-02-04: qty 1

## 2018-02-04 MED ORDER — PHENOL 1.4 % MT LIQD: 1.0000 | OROMUCOSAL | Status: DC | PRN

## 2018-02-04 MED ORDER — MORPHINE SULFATE (PF) 2 MG/ML IV SOLN
2.0000 mg | INTRAVENOUS | Status: DC | PRN
  Administered 2018-02-04: 2 mg via INTRAVENOUS
  Filled 2018-02-04 (×2): qty 1

## 2018-02-04 SURGICAL SUPPLY — 93 items
ADH SKN CLS APL DERMABOND .7 (GAUZE/BANDAGES/DRESSINGS) ×8
BAG DECANTER FOR FLEXI CONT (MISCELLANEOUS) ×4 IMPLANT
BIT DRILL LONG 3.0X30 (BIT) ×1 IMPLANT
BIT DRILL LONG 3.0X30MM (BIT) ×1
BIT DRILL LONG 3X80 (BIT) IMPLANT
BIT DRILL LONG 3X80MM (BIT)
BIT DRILL LONG 4X80 (BIT) IMPLANT
BIT DRILL LONG 4X80MM (BIT)
BIT DRILL SHORT 3.0X30 (BIT) IMPLANT
BIT DRILL SHORT 3.0X30MM (BIT)
BIT DRILL SHORT 3X80 (BIT) IMPLANT
BIT DRILL SHORT 3X80MM (BIT)
BLADE CLIPPER SURG (BLADE) IMPLANT
BLADE SURG 11 STRL SS (BLADE) ×4 IMPLANT
BONE MATRIX OSTEOCEL PRO MED (Bone Implant) ×8 IMPLANT
CAGE COROENT XL WIDE 14X22X50M (Cage) ×2 IMPLANT
CARTRIDGE OIL MAESTRO DRILL (MISCELLANEOUS) ×2 IMPLANT
CLIP NEUROVISION LG (CLIP) ×2 IMPLANT
CONT SPEC 4OZ CLIKSEAL STRL BL (MISCELLANEOUS) ×4 IMPLANT
COROENT XL 12X22X55 (Orthopedic Implant) ×2 IMPLANT
COVER BACK TABLE 24X17X13 BIG (DRAPES) IMPLANT
COVER BACK TABLE 60X90IN (DRAPES) ×4 IMPLANT
DERMABOND ADVANCED (GAUZE/BANDAGES/DRESSINGS) ×8
DERMABOND ADVANCED .7 DNX12 (GAUZE/BANDAGES/DRESSINGS) ×6 IMPLANT
DIFFUSER DRILL AIR PNEUMATIC (MISCELLANEOUS) ×4 IMPLANT
DRAPE C-ARM 42X72 X-RAY (DRAPES) ×8 IMPLANT
DRAPE C-ARMOR (DRAPES) ×8 IMPLANT
DRAPE LAPAROTOMY 100X72X124 (DRAPES) ×8 IMPLANT
DRAPE POUCH INSTRU U-SHP 10X18 (DRAPES) ×4 IMPLANT
DRAPE SHEET LG 3/4 BI-LAMINATE (DRAPES) ×4 IMPLANT
DRSG OPSITE POSTOP 4X6 (GAUZE/BANDAGES/DRESSINGS) ×4 IMPLANT
DRSG OPSITE POSTOP 4X8 (GAUZE/BANDAGES/DRESSINGS) ×4 IMPLANT
DURAPREP 26ML APPLICATOR (WOUND CARE) ×8 IMPLANT
ELECT BLADE 4.0 EZ CLEAN MEGAD (MISCELLANEOUS)
ELECT REM PT RETURN 9FT ADLT (ELECTROSURGICAL) ×8
ELECTRODE BLDE 4.0 EZ CLN MEGD (MISCELLANEOUS) IMPLANT
ELECTRODE REM PT RTRN 9FT ADLT (ELECTROSURGICAL) ×4 IMPLANT
GAUZE 4X4 16PLY RFD (DISPOSABLE) IMPLANT
GLOVE BIO SURGEON STRL SZ8 (GLOVE) ×2 IMPLANT
GLOVE BIOGEL PI IND STRL 7.0 (GLOVE) IMPLANT
GLOVE BIOGEL PI IND STRL 7.5 (GLOVE) IMPLANT
GLOVE BIOGEL PI IND STRL 8.5 (GLOVE) ×4 IMPLANT
GLOVE BIOGEL PI INDICATOR 7.0 (GLOVE) ×2
GLOVE BIOGEL PI INDICATOR 7.5 (GLOVE) ×2
GLOVE BIOGEL PI INDICATOR 8.5 (GLOVE) ×4
GLOVE ECLIPSE 8.5 STRL (GLOVE) ×8 IMPLANT
GLOVE EXAM NITRILE LRG STRL (GLOVE) IMPLANT
GLOVE EXAM NITRILE XL STR (GLOVE) IMPLANT
GLOVE EXAM NITRILE XS STR PU (GLOVE) IMPLANT
GLOVE INDICATOR 8.5 STRL (GLOVE) ×2 IMPLANT
GLOVE SURG SS PI 6.5 STRL IVOR (GLOVE) ×2 IMPLANT
GLOVE SURG SS PI 7.0 STRL IVOR (GLOVE) ×2 IMPLANT
GOWN STRL REUS W/ TWL LRG LVL3 (GOWN DISPOSABLE) IMPLANT
GOWN STRL REUS W/ TWL XL LVL3 (GOWN DISPOSABLE) ×6 IMPLANT
GOWN STRL REUS W/TWL 2XL LVL3 (GOWN DISPOSABLE) ×12 IMPLANT
GOWN STRL REUS W/TWL LRG LVL3 (GOWN DISPOSABLE) ×8
GOWN STRL REUS W/TWL XL LVL3 (GOWN DISPOSABLE) ×4
GUIDEWIRE NITINOL BEVEL TIP (WIRE) ×16 IMPLANT
HEMOSTAT POWDER SURGIFOAM 1G (HEMOSTASIS) ×4 IMPLANT
KIT BASIN OR (CUSTOM PROCEDURE TRAY) ×8 IMPLANT
KIT DILATOR XLIF 5 (KITS) ×1 IMPLANT
KIT SPINE MAZOR X ROBO DISP (MISCELLANEOUS) ×4 IMPLANT
KIT SURGICAL ACCESS MAXCESS 4 (KITS) ×2 IMPLANT
KIT TURNOVER KIT B (KITS) ×4 IMPLANT
KIT XLIF (KITS) ×1
MARKER SKIN DUAL TIP RULER LAB (MISCELLANEOUS) ×4 IMPLANT
MODULE NVM5 NEXT GEN EMG (NEEDLE) ×2 IMPLANT
NDL HYPO 25X1 1.5 SAFETY (NEEDLE) ×4 IMPLANT
NEEDLE HYPO 25X1 1.5 SAFETY (NEEDLE) ×8 IMPLANT
NS IRRIG 1000ML POUR BTL (IV SOLUTION) ×6 IMPLANT
OIL CARTRIDGE MAESTRO DRILL (MISCELLANEOUS) ×4
PACK LAMINECTOMY NEURO (CUSTOM PROCEDURE TRAY) ×8 IMPLANT
PAD ARMBOARD 7.5X6 YLW CONV (MISCELLANEOUS) ×12 IMPLANT
PIN HEAD 2.5X60MM (PIN) IMPLANT
ROD RELINE MAS LORD 5.5X100MM (Rod) ×2 IMPLANT
ROD RELINE MAS LORD 5.5X110MM (Rod) ×2 IMPLANT
SCREW LOCK RELINE 5.5 TULIP (Screw) ×16 IMPLANT
SCREW MAS RELINE 6.5X45 POLY (Screw) ×4 IMPLANT
SCREW RELINE MAS 7.5X50MM POLY (Screw) ×4 IMPLANT
SCREW RELINE MAS POLY 5.5X45MM (Screw) ×8 IMPLANT
SCREW SCHANZ SA 4.0MM (MISCELLANEOUS) ×2 IMPLANT
SPACER COROENT XL 16X22X55 (Spacer) ×2 IMPLANT
SPONGE LAP 4X18 RFD (DISPOSABLE) IMPLANT
SPONGE SURGIFOAM ABS GEL SZ50 (HEMOSTASIS) IMPLANT
SUT VIC AB 1 CT1 18XBRD ANBCTR (SUTURE) ×2 IMPLANT
SUT VIC AB 1 CT1 8-18 (SUTURE) ×4
SUT VIC AB 2-0 CP2 18 (SUTURE) ×20 IMPLANT
SUT VIC AB 3-0 SH 8-18 (SUTURE) ×22 IMPLANT
TOWEL GREEN STERILE (TOWEL DISPOSABLE) ×8 IMPLANT
TOWEL GREEN STERILE FF (TOWEL DISPOSABLE) ×8 IMPLANT
TRAY FOLEY MTR SLVR 16FR STAT (SET/KITS/TRAYS/PACK) ×6 IMPLANT
TUBE MAZOR SA REDUCTION (TUBING) ×4 IMPLANT
WATER STERILE IRR 1000ML POUR (IV SOLUTION) ×8 IMPLANT

## 2018-02-04 NOTE — Anesthesia Preprocedure Evaluation (Addendum)
Anesthesia Evaluation  Patient identified by MRN, date of birth, ID band Patient awake    Reviewed: Allergy & Precautions, NPO status , Patient's Chart, lab work & pertinent test results  Airway Mallampati: I  TM Distance: >3 FB Neck ROM: Full    Dental  (+) Caps, Dental Advisory Given, Teeth Intact   Pulmonary    Pulmonary exam normal        Cardiovascular hypertension, Pt. on medications + CAD and + Cardiac Stents  Normal cardiovascular exam  PAT ablated 06/2014 sucessfully   Neuro/Psych Depression    GI/Hepatic GERD  Medicated and Controlled,  Endo/Other  diabetes, Type 2, Insulin Dependent  Renal/GU      Musculoskeletal   Abdominal   Peds  Hematology   Anesthesia Other Findings   Reproductive/Obstetrics                            Anesthesia Physical Anesthesia Plan  ASA: III  Anesthesia Plan: General   Post-op Pain Management:    Induction: Intravenous  PONV Risk Score and Plan: 3 and Ondansetron, Dexamethasone and Midazolam  Airway Management Planned: Oral ETT  Additional Equipment:   Intra-op Plan:   Post-operative Plan: Extubation in OR  Informed Consent: I have reviewed the patients History and Physical, chart, labs and discussed the procedure including the risks, benefits and alternatives for the proposed anesthesia with the patient or authorized representative who has indicated his/her understanding and acceptance.     Plan Discussed with: CRNA and Surgeon  Anesthesia Plan Comments:         Anesthesia Quick Evaluation

## 2018-02-04 NOTE — H&P (Addendum)
Maria [name] is an 77 y.o. female.   Chief Complaint: Back and bilateral leg pain HPI: Ms. Maria [name] is a 77 year old individual whose had significant spondylitic disease in the lower lumbar spine.  She had previous decompressions and she has had a number of epidural steroid injections.  [name] has evidence of advanced spondylosis with flattening of her back and a degenerative scoliotic curve at L2-3 L3-4 and L4-5.  After careful consideration of her options I advised surgical decompression via anterolateral decompression using minimally invasive technique with ex lift device placed at L2 334 and 4 5 and posterior fixation from L2-L5 with pedicle screw fixation robotically assisted.  She is now admitted for that procedure.  Past Medical History:  Diagnosis Date  . Anemia 05/1985  . Carotid artery disease (HCC)    a. 02/2015 - 1-39% bilaterally.  . Chronic back pain   . Concussion   . Coronary artery disease    [facility]  . DDD (degenerative disc disease), cervical   . DDD (degenerative disc disease), lumbar   . Fracture of lower leg 07/2001   Right  . GERD (gastroesophageal reflux disease)   . Hyperlipidemia   . Hypertension   . Multinodular thyroid   . Pneumonia ?1990's X 1  . PSVT (paroxysmal supraventricular tachycardia) (HCC)    a. s/p catheter ablation of a parahisian atrial tachycardia which was successfully ablated from the non-coronary cusp of the aortic root 06/2014.  Maria [name]. Renal cyst 07/10/2006   bilateral  . Right knee pain 02/2010   Injected - Dr. Shelle IronBeane  . Sinus pause    a. By event monitoring 09/2014 - BB stopped at that time.  . Syncope and collapse 07/2001   "that's when I broke my leg"  . Type II diabetes mellitus (HCC) 2002    Past Surgical History:  Procedure Laterality Date  . ANTERIOR CERVICAL DECOMP/DISCECTOMY FUSION  10/03/2005   C4/5; C5/6  "it's got a plate in there"  . BACK SURGERY    . BREAST BIOPSY Bilateral 1968 (multiple)   "all  benign"  . BREAST LUMPECTOMY Bilateral 1968  . CARDIAC CATHETERIZATION N/A 11/18/2014   Procedure: Left Heart Cath and Coronary Angiography;  Surgeon: Peter M SwazilandJordan, MD;  [facility];  Service: Cardiovascular;  Laterality: N/A;  . CARDIAC CATHETERIZATION N/A 01/22/2015   Procedure: Coronary Stent Intervention;  Surgeon: Peter M SwazilandJordan, MD;  [facility];  Service: Cardiovascular;  Laterality: N/A;  . CARDIAC CATHETERIZATION N/A 06/10/2015   Procedure: Left Heart Cath and Coronary Angiography;  Surgeon: Peter M SwazilandJordan, MD;  [facility];  Service: Cardiovascular;  Laterality: N/A;  . CATARACT EXTRACTION W/ INTRAOCULAR LENS  IMPLANT, BILATERAL Bilateral 12/2010  . CORONARY ANGIOPLASTY    . ESOPHAGOGASTRODUODENOSCOPY  08/14/2005   gastropathy biopsy, negative  . LAPAROSCOPIC CHOLECYSTECTOMY  08/1992  . POSTERIOR LAMINECTOMY / DECOMPRESSION LUMBAR SPINE  09/2001   due to herniated disc  . SUPRAVENTRICULAR TACHYCARDIA ABLATION N/A 07/06/2014   Procedure: SUPRAVENTRICULAR TACHYCARDIA ABLATION;  Surgeon: Marinus MawGregg W Taylor, MD;  [facility];  Service: Cardiovascular;  Laterality: N/A;  . VAGINAL HYSTERECTOMY  1968   spotting    Family History  Problem Relation Age of Onset  . Hypertension Mother   . Heart disease Mother   . Diabetes Sister   . Myasthenia gravis Sister   . Diabetes Brother        1/2 juvenile; foot ulcer; periph.  neuropathy  . Heart disease Maternal Grandfather        MI  . Hypertension Maternal Grandfather   . Depression Paternal Aunt   . Hypertension Maternal Grandmother   . Diabetes Maternal Grandmother   . Stroke Maternal Grandmother   . Hypertension Paternal Grandmother   . Diabetes Paternal Grandmother   . Hypertension Paternal Grandfather   . Diabetes Paternal Grandfather   . Breast cancer Maternal Aunt   . Colon cancer Neg Hx    Social History:  reports that she has never smoked. She has never used smokeless  tobacco. She reports that she does not drink alcohol or use drugs.  Allergies:  Allergies  Allergen Reactions  . Crestor [Rosuvastatin] Other (See Comments)    MYALGIAS  . Ezetimibe-Simvastatin Other (See Comments)    MYALGIAS  . Pravastatin Other (See Comments)    Myalgias  . Nsaids     UNSPECIFIED REACTION   . Acetaminophen Anxiety and Other (See Comments)    REACTION: jittery with plain tylenol, but tolerated tylenol/benadryl combination  . Carvedilol Other (See Comments)    nightmares  . Gabapentin Diarrhea  . Glipizide Other (See Comments)    GI intolerance.    . Ibuprofen Other (See Comments)    REACTION: GI upset at high doses  . Insulin Glargine Rash    Rash  . Levemir [Insulin Detemir] Rash  . Macrobid [Nitrofurantoin Macrocrystal] Nausea And Vomiting and Other (See Comments)    ACHES  . Metformin Nausea And Vomiting    REACTION: GI upset  . Metoprolol Tartrate Other (See Comments)    REACTION: nightmares  . Naproxen Nausea And Vomiting    REACTION: GI upset    Medications Prior to Admission  Medication Sig Dispense Refill  . Alirocumab (PRALUENT) 75 MG/ML SOPN Inject 1 pen into the skin every 14 (fourteen) days. 2 pen 11  . aspirin EC 81 MG tablet Take 81 mg by mouth once.     Maria [name] atenolol (TENORMIN) 25 MG tablet TAKE 1/2 TABLET (12.5 MG TOTAL) BY MOUTH DAILY. (Patient taking differently: Take 12.5 mg by mouth daily. ) 45 tablet 0  . glyBURIDE (DIABETA) 5 MG tablet TAKE 1 TO 2 TABLETS BY MOUTH DAILY WITH BREAKFAST. 60 tablet 5  . insulin degludec (TRESIBA FLEXTOUCH) 100 UNIT/ML SOPN FlexTouch Pen Inject 0.14 mLs (14 Units total) into the skin daily at 10 pm. (Patient taking differently: Inject 20 Units into the skin daily at 3 pm. ) 5 pen 11  . lidocaine (LIDODERM) 5 % Place 1 patch onto the skin daily. Remove & Discard patch within 12 hours or as directed by MD (Patient taking differently: Place 1 patch onto the skin daily as needed (for pain.). Remove & Discard  patch within 12 hours or as directed by MD) 30 patch 0  . loperamide (IMODIUM) 2 MG capsule Take 2 mg by mouth 4 (four) times daily as needed for diarrhea or loose stools.     Maria [name] losartan (COZAAR) 25 MG tablet Take 1 tablet (25 mg total) by mouth daily. Please make an appt with Dr. [name] for October for future refills. (Patient taking differently: Take 25 mg by mouth at bedtime. Please make an appt with Dr. [name] for October for future refills.) 90 tablet 0  . omeprazole (PRILOSEC) 20 MG capsule Take 1 capsule (20 mg total) by mouth 2 (two) times daily. (Patient taking differently: Take 20 mg by mouth 2 (two) times daily as needed (for acid reflux/indigestion.). ) 60 capsule  5  . oxyCODONE (OXY IR/ROXICODONE) 5 MG immediate release tablet TAKE 1/2 TO 1 TABLET BY MOUTH EVERY 6 HOURS AS NEEDED FOR SEVERE PAIN. (Patient taking differently: Take 5 mg by mouth every 6 (six) hours as needed (for pain.). ) 30 tablet 0  . traMADol (ULTRAM) 50 MG tablet Take 50 mg by mouth every 6 (six) hours as needed (for pain).    Maria [name] glucose blood (ONE TOUCH ULTRA TEST) test strip USE AS INSTRUCTED TO TEST BLOOD SUGAR THREE TIMES DAILY. 300 each 1  . Insulin Pen Needle (PEN NEEDLES) 32G X 4 MM MISC 1 each by Does not apply route daily. 100 each 3  . nitroGLYCERIN (NITROSTAT) 0.4 MG SL tablet PLACE 1 TABLET (0.4 MG TOTAL) UNDER THE TONGUE EVERY 5 (FIVE) MINUTES AS NEEDED FOR CHEST PAIN. 25 tablet 1    Results for orders placed or performed during the hospital encounter of 02/04/18 (from the past 48 hour(s))  Glucose, capillary     Status: Abnormal   Collection Time: 02/04/18  6:38 AM  Result Value Ref Range   Glucose-Capillary 182 (H) 70 - 99 mg/dL   No results found.  Review of Systems  Constitutional: Negative.   HENT: Negative.   Eyes: Negative.   Respiratory: Negative.   Cardiovascular: Negative.   Gastrointestinal: Negative.   Genitourinary: Negative.        History of renal cyst on the right   Musculoskeletal: Positive for back pain.  Skin: Negative.   Neurological:       Weakness in both lower extremities.  Ability to walk distances.  Progressive slumping forward when ambulating any distance.  Psychiatric/Behavioral: Negative.     Blood pressure (!) 139/57, pulse 69, temperature 98.2 F (36.8 C), temperature source Oral, resp. rate 18, SpO2 99 %. Physical Exam  Constitutional: She is oriented to person, place, and time. She appears well-developed and well-nourished.  HENT:  Head: Normocephalic and atraumatic.  Eyes: Pupils are equal, round, and reactive to light. Conjunctivae and EOM are normal.  Neck: Normal range of motion. Neck supple.  GI: Soft. Bowel sounds are normal.  Musculoskeletal:  Moderate tenderness to palpation percussion in the lower lumbar spine positive straight leg raising at 30 degrees in either lower extremity Patrick's maneuver is negative.  Neurological: She is alert and oriented to person, place, and time.  Absent deep tendon reflexes in both lower extremities.  This in the tibialis anterior on the right to 4 out of 5.  Skin: Skin is warm and dry.  Psychiatric: She has a normal mood and affect. Her behavior is normal. Judgment and thought content normal.     Assessment/Plan Spondylosis, stenosis, lumbar radiculopathy.  Progressive deformity formation in the lumbar spine.  Plan: Decompression and fusion L2-3 L3-4 L4-5 with indirect technique.  Stefani Dama, MD 02/04/2018, 7:33 AM

## 2018-02-04 NOTE — Anesthesia Postprocedure Evaluation (Signed)
Anesthesia Post Note  Patient: Maria [name]  Procedure(s) Performed: Lumbar two-three Lumbar three-four Lumbar four-five  Anterolateral decompression/percutaneous posterior arthrodesis, Mazor (Left ) LUMBAR PERCUTANEOUS PEDICLE SCREW LUMBAR TWO - LUMBAR FIVE (N/A ) APPLICATION OF ROBOTIC ASSISTANCE FOR SPINAL PROCEDURE (N/A )     Patient location during evaluation: PACU Anesthesia Type: General Level of consciousness: awake and alert Pain management: pain level controlled Vital Signs Assessment: post-procedure vital signs reviewed and stable Respiratory status: spontaneous breathing, nonlabored ventilation, respiratory function stable and patient connected to nasal cannula oxygen Cardiovascular status: blood pressure returned to baseline and stable Postop Assessment: no apparent nausea or vomiting Anesthetic complications: no    Last Vitals:  Vitals:   02/04/18 1445 02/04/18 1457  BP: 120/77   Pulse: 78 73  Resp: 14 12  Temp:    SpO2: 100% 100%    Last Pain:  Vitals:   02/04/18 0640  TempSrc: Oral  PainSc:                  Maria [name]

## 2018-02-04 NOTE — Transfer of Care (Addendum)
Immediate Anesthesia Transfer of Care Note  Patient: Maria [name]  Procedure(s) Performed: Lumbar two-three Lumbar three-four Lumbar four-five  Anterolateral decompression/percutaneous posterior arthrodesis, Mazor (Left ) LUMBAR PERCUTANEOUS PEDICLE SCREW LUMBAR TWO - LUMBAR FIVE (N/A ) APPLICATION OF ROBOTIC ASSISTANCE FOR SPINAL PROCEDURE (N/A )  Patient Location: PACU  Anesthesia Type:General  Level of Consciousness: awake, oriented and patient cooperative  Airway & Oxygen Therapy: Patient Spontanous Breathing and Patient connected to nasal cannula oxygen  Post-op Assessment: Report given to RN and Post -op Vital signs reviewed and stable  Post vital signs: Reviewed and stable  Last Vitals:  Vitals Value Taken Time  BP 99/58 02/04/2018  1:42 PM  Temp    Pulse 66 02/04/2018  1:44 PM  Resp 23 02/04/2018  1:44 PM  SpO2 100 % 02/04/2018  1:44 PM  Vitals shown include unvalidated device data.  Last Pain:  Vitals:   02/04/18 0640  TempSrc: Oral  PainSc:       Patients Stated Pain Goal: 0 (02/04/18 [id number])  Complications: No apparent anesthesia complications

## 2018-02-04 NOTE — Anesthesia Procedure Notes (Addendum)
Procedure Name: Intubation Date/Time: 02/04/2018 7:43 AM Performed by: Julian ReilWelty, Raesha Coonrod F, CRNA Pre-anesthesia Checklist: Patient identified, Emergency Drugs available, Suction available and Patient being monitored Patient Re-evaluated:Patient Re-evaluated prior to induction Oxygen Delivery Method: Circle system utilized Preoxygenation: Pre-oxygenation with 100% oxygen Induction Type: IV induction Ventilation: Mask ventilation without difficulty Laryngoscope Size: Glidescope and 4 Grade View: Grade II Tube type: Oral Tube size: 7.0 mm Number of attempts: 1 Airway Equipment and Method: Stylet and Video-laryngoscopy Placement Confirmation: ETT inserted through vocal cords under direct vision,  positive ETCO2 and breath sounds checked- equal and bilateral Secured at: 21 cm Tube secured with: Tape Dental Injury: Teeth and Oropharynx as per pre-operative assessment  Difficulty Due To: Difficulty was anticipated, Difficult Airway- due to reduced neck mobility, Difficult Airway- due to dentition and Difficult Airway- due to anterior larynx Comments: In Short-Stay, noted anterior anatomy, recessed chin, H/O cervical neck fusion, limited ROM of neck extension and upper front teeth x 2 with caps.  None loose per patient report. 1st DL Miller 3, only post pharynx seen.  2nd DL with Glidescope 4, grade 2.  4x4s bite block used.

## 2018-02-04 NOTE — Op Note (Signed)
Date of surgery: February 04, 2018 Preop diagnosis: Lumbar spondylosis and stenosis with degenerative scoliosis L2-3 L3-4 L4-5.  Lumbar radiculopathy. Postoperative diagnosis: Same Procedure: Anterolateral decompression L2-3 L3-4 L4-5 with placement of ex lift spacer allograft using indirect decompression.  EMG monitoring during decompression and hardware placement.  Segmental fixation L2-L5 with pedicle screws placed percutaneously with robotic assistance. Surgeon: Barnett AbuHenry Ethelean Colla First assistant: Donalee CitrinGary Cram MD Anesthesia: General endotracheal Indications: Ms. Maria PalauBarbara [name] is a 77 year old individuals had significant chronic lumbar radiculopathy.  She has evidence of advanced spondylosis and multiple levels of lumbar spine including L2-3 L3-4 and L4-5.  She has lateral recess stenosis and subarticular stenosis at L4-5 and L3-4 and to a greater extent at L2-3.  She is been advised regarding surgical decompression and stabilization via various modes including an open operation versus a minimally invasive soft abrasion using indirect decompression with an ex lift spacer and post dural lateral fixation with pedicle screws placed percutaneously using robotic assistance.  She is now admitted for this procedure.  Procedure: The patient was brought to the operating room supine on the stretcher.  After the smooth induction of general endotracheal anesthesia, she was connected to EMG monitoring with various electrodes being placed over the lower extremity musculature is chosen.  Then she was placed in the right lateral decubitus position.  Positioning on the operating table was checked with fluoroscopy to verify orthogonal allergy.  Then the lateral aspect of L4-5 and L3-4 and L4-3 were marked on the patient's skin area around this area was then prepped with alcohol DuraPrep and draped in a sterile fashion.  By making the incision over L4-L5 dissection was carried down through the subcutaneous tissues.  A separate  stab incision was created posteriorly to admit one finger into the retroperitoneal space.  Then a small cannular guide was placed through the lateral aperture through the psoas muscle over the disc space at L4-L5.  EMG stimulation was performed in this region to make sure there is no involvement of the plexus.  With this a series of dilators were placed over this region the K wire was placed in the center of the probe to anchor it to the L4-5 interspace.  This was all confirmed with radiographic imaging.  A 150 mm deep retractor was then placed over this and the retractor was dilated out to expose the lateral aspect of the disc space.  All the time EMG monitoring was performed with stimulation being performed prior to placement of a shim retractor to maintain the retractor in its position.  The disc space was then opened with a #15 blade and a combination of curettes and rongeurs was used to evacuate a substantial quantity of severely degenerated disc material the endplates were completely decorticated and the opposite lateral portion of the ligament was opened with a Cobb elevator in both directions.  Then the disc space was evacuated completely and a series of disc shavers were used to help in this evacuation along with a series of dilators.  Ultimately was felt that the space would dilate to a 16 mm tall lordotic space to allow placement of such a spacer measuring 55 mm in diameter.  This was packed with ostia cell.  Final placement was checked radiographically in both the AP and lateral projections.  The retractor was then removed and through a separate incision made halfway between L2-3 and L3-4 both the L2-3 and L3-4 areas were instrumented in a similar fashion at L3-4 a 12 x 22 mm x 55  mm wide spacer with 10 degrees of lordosis was placed and at L2-3 a 14 mm tall by 22 mm wide with 50 mm total with 10 degree lordotic spacer was placed along with ostia cell into the interspace.  Once all the spacers were  placed and final radiographs were obtained all the retractors were removed and the deep tissues were closed with 2-0 Vicryl in interrupted fashion 3-0 Vicryl was used in the subcuticular tissues Dermabond was placed on the skin.  The patient was then taken from the lateral decubitus position and placed prone onto the [facility] operating table.  The robotic arm was connected to the [facility] operating table and then the back was prepped with alcohol DuraPrep and draped in a sterile fashion.  An anchoring pin was placed into the left posterior superior iliac crest using a Steinmann pin driver.  This allowed the robotic arm to be attached firmly to the patient.  Then by reprogramming the target zones and the size screws to be placed the program and plan was verified and put into action.  Pedicle entry sites were chosen at L2-3 L3-L4 and L5 the skin penetration points were all marked and on each side 2 separate incisions were created to allow placement of the pedicle screws through these incisions.  At L2 5.5 x 45 mm screws were placed as they were at L3 and then at L4 6.5 x 45 mm screws were placed in L5 7.5 x 50 mm screws were placed.  These were then connected in a neutral construct with 2 rods on the right side and 90 mm rod was used and on the left side a 110 mm rod was used.  The final positioning of the construct was checked radiographically.  The screws were inserted over a K wire at L2 and L3 using EMG monitoring to verify that there was no cut out and no increased irritability of any of the exiting nerve roots.  Similarly at L4 and L5 6.5 mm tap was used also with EMG monitoring and no cut out was noted.  With all the hardware being placed in a final radiographs being checked the wounds were closed with 2-0 Vicryl interrupted fashion 3-0 Vicryl subcuticularly Dermabond was placed on the skin.  Patient was returned to recovery room in stable condition blood loss for the entire procedure was estimated 200 cc.

## 2018-02-05 ENCOUNTER — Other Ambulatory Visit: Payer: Self-pay

## 2018-02-05 ENCOUNTER — Encounter (HOSPITAL_COMMUNITY): Payer: Self-pay | Admitting: Neurological Surgery

## 2018-02-05 LAB — GLUCOSE, CAPILLARY
GLUCOSE-CAPILLARY: 133 mg/dL — AB (ref 70–99)
GLUCOSE-CAPILLARY: 105 mg/dL — AB (ref 70–99)
Glucose-Capillary: 118 mg/dL — ABNORMAL HIGH (ref 70–99)
Glucose-Capillary: 177 mg/dL — ABNORMAL HIGH (ref 70–99)

## 2018-02-05 MED FILL — Thrombin For Soln 5000 Unit: CUTANEOUS | Qty: 5000 | Status: AC

## 2018-02-05 MED FILL — Gelatin Absorbable MT Powder: OROMUCOSAL | Qty: 1 | Status: AC

## 2018-02-05 NOTE — Progress Notes (Signed)
Vital signs are stable Motor function appears to be doing reasonably well Incisions are clean and dry Patient ID: Maria ManuelBarbara D Evers, female   DOB: [birth year], 77 y.o.   MRN: [id number] Continue mobilization

## 2018-02-05 NOTE — Evaluation (Signed)
Occupational Therapy Evaluation Patient Details Name: Maria [name] MRN: [id number] DOB: 11/04/1940 Today's Date: 02/05/2018    History of Present Illness Pt is a 77 y.o. female s/p anterior lateral lumbar fusion at L2-3, L3-4 &L4-5. PMH is significant for HLD, HTN,CAD, PSVT and T2DM.   Clinical Impression   PTA, pt was living with her husband and was independent. Currently, pt requires Min Guard A for LB ADLs with AE and Min Guard-Min A for functional mobility using RW. Provided education on back precautions, bed mobility, brace management, LB ADLs with AE, toileting, and shower transfer with shower seat; pt demonstrated understanding. Answered all pt questions. Recommend dc home once medically stable per physician. All acute OT needs met and will sign off. Thank you.     Follow Up Recommendations  No OT follow up;Supervision/Assistance - 24 hour    Equipment Recommendations  None recommended by OT;Other (comment)(RW)    Recommendations for Other Services PT consult     Precautions / Restrictions Precautions Precautions: Back Precaution Booklet Issued: Yes (comment) Precaution Comments: Reviewed precautions in full with pt and spouse.  Required Braces or Orthoses: Spinal Brace Spinal Brace: Applied in sitting position Restrictions Weight Bearing Restrictions: No      Mobility Bed Mobility               General bed mobility comments: Pt reporting understanding of log roll technique  Transfers Overall transfer level: Needs assistance Equipment used: Rolling walker (2 wheeled) Transfers: Sit to/from Stand Sit to Stand: Min guard         General transfer comment: Min Guard A for safety. Cues for weight shift forward    Balance Overall balance assessment: Needs assistance Sitting-balance support: No upper extremity supported;Feet supported Sitting balance-Leahy Scale: Fair     Standing balance support: Bilateral upper extremity supported;During functional  activity Standing balance-Leahy Scale: Fair Standing balance comment: Able to maintain stating while pulling pants over hips                           ADL either performed or assessed with clinical judgement   ADL Overall ADL's : Needs assistance/impaired Eating/Feeding: Set up;Supervision/ safety;Sitting   Grooming: Min guard;Standing Grooming Details (indicate cue type and reason): Educating on using second cup for rinsing Upper Body Bathing: Set up;Supervision/ safety;Sitting   Lower Body Bathing: Min guard;Sit to/from stand Lower Body Bathing Details (indicate cue type and reason): Min Guard A for safety Upper Body Dressing : Set up;Supervision/safety;Sitting Upper Body Dressing Details (indicate cue type and reason): Pt donning bra and shirt. Pt donning brace with supervision Lower Body Dressing: Min guard;With adaptive equipment;Sit to/from stand Lower Body Dressing Details (indicate cue type and reason): Min Guard A for safety. Providing education on use of AE. Pt donning/doffing socks with AE and donning pants without AE.  Toilet Transfer: Min guard;Ambulation;RW(Simulated in room)     Toileting - Clothing Manipulation Details (indicate cue type and reason): Educating pt on toielt hygiene. pt demonstrating understanding by simulating movements Tub/ Shower Transfer: Minimal assistance;Walk-in shower;Ambulation;Rolling walker Tub/Shower Transfer Details (indicate cue type and reason): Min A for safety. Educating pt on compensaotry technique for use of RW to transfer into walk in shower Functional mobility during ADLs: Min guard;Rolling walker General ADL Comments: Pt performing UB ADLs with supervision, LB ADLs and LB ADLs with MIn Guard A. Pt very aware of precautions and stating throughout session "I can't bend or twist."  Vision         Perception     Praxis      Pertinent Vitals/Pain Pain Assessment: Faces Faces Pain Scale: Hurts even more Pain  Location: Back at incision Pain Descriptors / Indicators: Discomfort;Grimacing;Guarding;Tender Pain Intervention(s): Monitored during session;Limited activity within patient's tolerance;Repositioned     Hand Dominance     Extremity/Trunk Assessment Upper Extremity Assessment Upper Extremity Assessment: Overall WFL for tasks assessed   Lower Extremity Assessment Lower Extremity Assessment: Defer to PT evaluation RLE Deficits / Details: Pt reports RLE weakness    Cervical / Trunk Assessment Cervical / Trunk Assessment: Other exceptions Cervical / Trunk Exceptions: s/p lumbar surgery   Communication Communication Communication: No difficulties   Cognition Arousal/Alertness: Awake/alert Behavior During Therapy: WFL for tasks assessed/performed Overall Cognitive Status: Within Functional Limits for tasks assessed                                 General Comments: Pt with several questions and very aware of back precautions.    General Comments  Pt husband present throughout session    Exercises     Shoulder Instructions      Home Living Family/patient expects to be discharged to:: Private residence Living Arrangements: Spouse/significant other Available Help at Discharge: Family;Available 24 hours/day Type of Home: House Home Access: Stairs to enter [employer] of Steps: 2 Entrance Stairs-Rails: Can reach both Home Layout: One level     Bathroom Shower/Tub: Walk-in shower;Tub/shower unit   Bathroom Toilet: Handicapped height     Home Equipment: Environmental consultant - 4 wheels;Cane - single point          Prior Functioning/Environment Level of Independence: Independent with assistive device(s)        Comments: Rollator and SPC as needed        OT Problem List: Decreased strength;Decreased range of motion;Decreased activity tolerance;Impaired balance (sitting and/or standing);Decreased knowledge of use of DME or AE;Decreased knowledge of  precautions;Pain      OT Treatment/Interventions:      OT Goals(Current goals can be found in the care plan section) Acute Rehab OT Goals Patient Stated Goal: to get up steps safely  OT Goal Formulation: All assessment and education complete, DC therapy  OT Frequency:     Barriers to D/C:            Co-evaluation              AM-PAC PT "6 Clicks" Daily Activity     Outcome Measure Help from another person eating meals?: None Help from another person taking care of personal grooming?: A Little Help from another person toileting, which includes using toliet, bedpan, or urinal?: A Little Help from another person bathing (including washing, rinsing, drying)?: A Little Help from another person to put on and taking off regular upper body clothing?: None Help from another person to put on and taking off regular lower body clothing?: A Little 6 Click Score: 20   End of Session Equipment Utilized During Treatment: Rolling walker;Back brace Nurse Communication: Mobility status;Precautions  Activity Tolerance: Patient tolerated treatment well Patient left: in bed;with call bell/phone within reach;with family/visitor present  OT Visit Diagnosis: Unsteadiness on feet (R26.81);Other abnormalities of gait and mobility (R26.89);Muscle weakness (generalized) (M62.81);Pain Pain - part of body: (Back)                Time: 7564-3329 OT Time Calculation (min): 30 min Charges:  OT  General Charges $OT Visit: 1 Visit OT Evaluation $OT Eval Low Complexity: 1 Low OT Treatments $Self Care/Home Management : 8-22 mins  Shloima Clinch MSOT, OTR/L Acute Rehab Pager: [phone] Office: [place] 02/05/2018, 11:34 AM

## 2018-02-05 NOTE — Progress Notes (Signed)
Physical Therapy Evaluation Patient Details Name: Maria [name] MRN: [id number] DOB: 07/31/1940 Today's Date: 02/05/2018   History of Present Illness  Pt is a 77 y.o. female s/p anterior lateral lumbar fusion at L2-3, L3-4 &L4-5. PMH is significant for HLD, HTN,CAD, PSVT and T2DM.  Clinical Impression  Patient is s/p above surgery resulting in deficits listed below (see PT Problem List). PTA, pt living with spouse and ambulating with rollator or SPC as needed secondary to back pain and bil LE weakness. At time of evaluation pt performed transfers and ambulation with gross min A- min G with RW for safety. Pt required seated rest after initial attempt to negotiate stairs, as pt demonstrated increased lumbar pain and R knee buckle prior to ascending stairs. Deferred stairs until next session for pt safety. Patient educated on generalized walking program. Recommending RW at discharge in order to improve dynamic balance with post-op mobility. Acutely, patient will benefit from skilled PT to increase their independence and safety with mobility to allow discharge to the venue listed below.       Follow Up Recommendations No PT follow up;Supervision/Assistance - 24 hour    Equipment Recommendations  Rolling walker with 5" wheels    Recommendations for Other Services       Precautions / Restrictions Precautions Precautions: Back Precaution Booklet Issued: Yes (comment) Precaution Comments: Reviewed precautions in full with pt and spouse.  Required Braces or Orthoses: Spinal Brace Spinal Brace: Applied in sitting position Restrictions Weight Bearing Restrictions: No      Mobility  Bed Mobility               General bed mobility comments: OOB upon entryl; returned EOB to eat breakfast   Transfers Overall transfer level: Needs assistance Equipment used: Rolling walker (2 wheeled) Transfers: Sit to/from Stand Sit to Stand: Min guard;From elevated surface;Min assist          General transfer comment: Bed height adjusted to simulate home environment. Upon standing pt reported she was dizzy with min A for steadying and requested to sit. On second sit>stand pt did not report dizziness and was min guard.   Ambulation/Gait Ambulation/Gait assistance: Min guard;Min assist Gait Distance (Feet): 150 Feet Assistive device: Rolling walker (2 wheeled) Gait Pattern/deviations: Step-to pattern;Step-through pattern;Decreased step length - right;Trunk flexed Gait velocity: decreased Gait velocity interpretation: <1.31 ft/sec, indicative of household ambulator General Gait Details: Pt slow and guarded throughout with cues for upright posture and proximity to RW. Pt reporting an increase in pain with ambulation. Seated rest required prior to performing stairs.  Attempted to perform stairs with rail on the left and SPC on the right, but upon removing RW pt demosntrating R knee buckling and increased pain with pt shifting weight to lean against the wall. Pt given SPC but continuing to demonstrate increase pain and weakness shifting weight onto RLE requiring Min Assit . Returned to room in w/c without attempting stairs as pt reported she did not want to perform stairs at that time.   Stairs            Wheelchair Mobility    Modified Rankin (Stroke Patients Only)       Balance Overall balance assessment: Needs assistance Sitting-balance support: No upper extremity supported;Feet supported Sitting balance-Leahy Scale: Fair     Standing balance support: Bilateral upper extremity supported;During functional activity Standing balance-Leahy Scale: Poor Standing balance comment: Reliant on Bil UE support  Pertinent Vitals/Pain Pain Assessment: Faces Faces Pain Scale: Hurts whole lot Pain Location: Back at incision Pain Descriptors / Indicators: Discomfort;Grimacing;Guarding;Tender Pain Intervention(s): Limited activity within  patient's tolerance;Monitored during session    Home Living Family/patient expects to be discharged to:: Private residence Living Arrangements: Spouse/significant other Available Help at Discharge: Family;Available 24 hours/day Type of Home: House Home Access: Stairs to enter Entrance Stairs-Rails: Can reach both Entrance Stairs-Number of Steps: 2 Home Layout: One level Home Equipment: Walker - 4 wheels;Cane - single point      Prior Function Level of Independence: Independent with assistive device(s)         Comments: Rollator and SPC as needed     Hand Dominance        Extremity/Trunk Assessment   Upper Extremity Assessment Upper Extremity Assessment: Defer to OT evaluation    Lower Extremity Assessment Lower Extremity Assessment: Generalized weakness;RLE deficits/detail RLE Deficits / Details: Pt reports RLE weakness     Cervical / Trunk Assessment Cervical / Trunk Assessment: Other exceptions Cervical / Trunk Exceptions: s/p lumbar surgery  Communication   Communication: No difficulties  Cognition Arousal/Alertness: Awake/alert Behavior During Therapy: Anxious Overall Cognitive Status: Within Functional Limits for tasks assessed                                 General Comments: Anxious for therapy as she had increased pain, but was not due for medication yet      General Comments      Exercises     Assessment/Plan    PT Assessment Patient needs continued PT services  PT Problem List Decreased strength;Decreased activity tolerance;Decreased range of motion;Decreased balance;Decreased mobility;Decreased coordination;Decreased knowledge of use of DME;Decreased safety awareness;Decreased knowledge of precautions;Pain       PT Treatment Interventions DME instruction;Stair training;Gait training;Functional mobility training;Therapeutic activities;Therapeutic exercise;Balance training;Patient/family education;Modalities    PT Goals (Current  goals can be found in the Care Plan section)  Acute Rehab PT Goals Patient Stated Goal: to get up steps safely  PT Goal Formulation: With patient Time For Goal Achievement: 03/14/18 Potential to Achieve Goals: Fair    Frequency Min 5X/week   Barriers to discharge        Co-evaluation               AM-PAC PT "6 Clicks" Daily Activity  Outcome Measure Difficulty turning over in bed (including adjusting bedclothes, sheets and blankets)?: A Little Difficulty moving from lying on back to sitting on the side of the bed? : A Little Difficulty sitting down on and standing up from a chair with arms (e.g., wheelchair, bedside commode, etc,.)?: Unable Help needed moving to and from a bed to chair (including a wheelchair)?: A Little Help needed walking in hospital room?: A Little Help needed climbing 3-5 steps with a railing? : A Lot 6 Click Score: 15    End of Session Equipment Utilized During Treatment: Gait belt;Back brace Activity Tolerance: Patient limited by pain Patient left: in bed;with call bell/phone within reach;with family/visitor present(seated EOB) Nurse Communication: Mobility status;Patient requests pain meds PT Visit Diagnosis: Unsteadiness on feet (R26.81);Other abnormalities of gait and mobility (R26.89);Muscle weakness (generalized) (M62.81);Difficulty in walking, not elsewhere classified (R26.2);Pain Pain - part of body: (back)    Time: 1610-9604 PT Time Calculation (min) (ACUTE ONLY): 31 min   Charges:    1 Mod Eval  1 Gait training= 8-22 min  Donzetta KohutKaylee Jaela Yepez, SPT  Student Physical Therapist Acute Rehab [phone]   Donzetta KohutKaylee Presley Gora 02/05/2018, 10:09 AM

## 2018-02-05 NOTE — Progress Notes (Signed)
Inpatient Diabetes Program Recommendations  AACE/ADA: New Consensus Statement on Inpatient Glycemic Control (2015)  Target Ranges:  Prepandial:   less than 140 mg/dL      Peak postprandial:   less than 180 mg/dL (1-2 hours)      Critically ill patients:  140 - 180 mg/dL   Lab Results  Component Value Date   GLUCAP 133 (H) 02/05/2018   HGBA1C 8.6 (H) 01/25/2018    Review of Glycemic Control  Diabetes history: DM2 Outpatient Diabetes medications: Tresiba 20 units QD, glyburide 5 mg 1-2 tab QD Current orders for Inpatient glycemic control: Novolog 0-20 units tidwc  HgbA1C 8.6% Blood sugars well-controlled after surgery.  Inpatient Diabetes Program Recommendations:     Consider addition of Lantus 10 units Q24H (1/2 home dose of 20 units) Add Novolog HS correction  Spoke with pt about her HgbA1C of 8.6%. Pt states she will f/u with her PCP, as he keeps a close check on it. Pt states she believes stress plays a role in her blood sugars being elevated.  Discussed glucose and A1C goals. Discussed importance of checking CBGs and maintaining good CBG control to prevent long-term complications. Pt had no further questions.  Thank you. Maria [name], RD, LDN, CDE Inpatient Diabetes Coordinator [phone]

## 2018-02-06 LAB — GLUCOSE, CAPILLARY
GLUCOSE-CAPILLARY: 154 mg/dL — AB (ref 70–99)
GLUCOSE-CAPILLARY: 104 mg/dL — AB (ref 70–99)
GLUCOSE-CAPILLARY: 137 mg/dL — AB (ref 70–99)
Glucose-Capillary: 151 mg/dL — ABNORMAL HIGH (ref 70–99)

## 2018-02-06 MED ORDER — ALUM & MAG HYDROXIDE-SIMETH 200-200-20 MG/5ML PO SUSP: 30.0000 mL | ORAL | Status: DC | PRN

## 2018-02-06 NOTE — Progress Notes (Signed)
Patient ID: Maria ManuelBarbara D [name], female   DOB: 03/17/1941, 77 y.o.   MRN: [id number] All signs are stable Patient's ambulatory capacity is increasing but slowly Incisions remain clean and dry Mobilizing slowly Continue physical therapy occupational therapy for today Plan discharge for tomorrow

## 2018-02-06 NOTE — Progress Notes (Signed)
Physical Therapy Treatment Patient Details Name: Maria [name] MRN: [id number] DOB: December 27, 1940 Today's Date: 02/06/2018    History of Present Illness Pt is a 77 y.o. female s/p anterior lateral lumbar fusion at L2-3, L3-4 &L4-5. PMH is significant for HLD, HTN,CAD, PSVT and T2DM.    PT Comments    Pt making slow progression towards goals. Pt limited in post-op mobility by reports of nausea, bil LE pain, and generalized LE weakness. Pt requested to sit after approximately ~1102ft  and return to room in w/c. Pt educated on maintenance of spinal precautions while supine in bed with an emphasis on proper knee positioning to prevent twisting. Pt will need to negotiate 2 steps prior to d/c. Will continue to follow acutely and progress as able, per POC.     Follow Up Recommendations  No PT follow up;Supervision/Assistance - 24 hour(pending on pt progression (may need HHPT))     Equipment Recommendations  Rolling walker with 5" wheels(already recieved)    Recommendations for Other Services       Precautions / Restrictions Precautions Precautions: Back Precaution Comments: Pt recalled 3/3 precautions.  Required Braces or Orthoses: Spinal Brace Spinal Brace: Lumbar corset(Already donned prior to session. Doffed in sitting) Restrictions Weight Bearing Restrictions: No    Mobility  Bed Mobility Overal bed mobility: Needs Assistance Bed Mobility: Sit to Supine;Rolling Rolling: Min guard     Sit to supine: Min assist   General bed mobility comments: Min A required in order to elevate Bil LE.  Min A required to move Bil LE further onto bed with cues to move shoulders in the opposite direction in order to be centered. Educated on positioning of Bil LEs to prevent twisting while resting in supine. At end of session pt repositioned in bed with reports that symptoms were improving.   Transfers Overall transfer level: Needs assistance Equipment used: Rolling walker (2 wheeled) Transfers:  Sit to/from Stand Sit to Stand: Min guard         General transfer comment: Min Guard A for safety. Cues for weight shift forward prior to sit>stand  Ambulation/Gait Ambulation/Gait assistance: Min guard Gait Distance (Feet): 125 Feet Assistive device: Rolling walker (2 wheeled) Gait Pattern/deviations: Step-to pattern;Step-through pattern;Decreased step length - right;Trunk flexed Gait velocity: decreased Gait velocity interpretation: <1.31 ft/sec, indicative of household ambulator General Gait Details: Prior to session pt reporting Bil LE pain and weakness and increased back pain as compared to yesturday. Pt required cues to improve step length and for step-through pattern with good carryover. Prior to entering stairwell pt reporting she felt nasueas and "shaky", requesting to return to room. Pt returned to room in W/C as she believed she would need to lay down for symptoms to resolve.    Stairs             Wheelchair Mobility    Modified Rankin (Stroke Patients Only)       Balance Overall balance assessment: Needs assistance Sitting-balance support: No upper extremity supported;Feet supported Sitting balance-Leahy Scale: Fair     Standing balance support: Bilateral upper extremity supported;During functional activity Standing balance-Leahy Scale: Fair Standing balance comment: Reliant on Bil UE support                            Cognition Arousal/Alertness: Awake/alert Behavior During Therapy: Anxious Overall Cognitive Status: Within Functional Limits for tasks assessed  General Comments: Anxious to ambulate secondary to Bil LE pain/weakness at todays session. Pt reports she is unsure if she will be able to d/c today.       Exercises      General Comments General comments (skin integrity, edema, etc.): Pt spoke to MD twice throughout session with concerns of Bil pain and bil feet swelling        Pertinent Vitals/Pain Pain Assessment: 0-10 Pain Score: 7  Pain Location: Back at incision; Bil LE Pain Descriptors / Indicators: Discomfort;Grimacing;Guarding;Tender Pain Intervention(s): Limited activity within patient's tolerance;Monitored during session;Repositioned    Home Living                      Prior Function            PT Goals (current goals can now be found in the care plan section) Acute Rehab PT Goals Patient Stated Goal: to get up steps safely  PT Goal Formulation: With patient Time For Goal Achievement: 03/14/18 Potential to Achieve Goals: Fair Progress towards PT goals: Not progressing toward goals - comment(limited by Nausea and pain)    Frequency    Min 5X/week      PT Plan Current plan remains appropriate    Co-evaluation              AM-PAC PT "6 Clicks" Daily Activity  Outcome Measure  Difficulty turning over in bed (including adjusting bedclothes, sheets and blankets)?: Unable Difficulty moving from lying on back to sitting on the side of the bed? : Unable   Help needed moving to and from a bed to chair (including a wheelchair)?: A Little Help needed walking in hospital room?: A Little Help needed climbing 3-5 steps with a railing? : A Lot 6 Click Score: 10    End of Session Equipment Utilized During Treatment: Gait belt;Back brace Activity Tolerance: Patient limited by pain;Patient limited by fatigue;Other (comment)(reports of nausea ) Patient left: in bed;with call bell/phone within reach(seated EOB) Nurse Communication: Mobility status PT Visit Diagnosis: Unsteadiness on feet (R26.81);Other abnormalities of gait and mobility (R26.89);Muscle weakness (generalized) (M62.81);Difficulty in walking, not elsewhere classified (R26.2);Pain Pain - Right/Left: Right(and Left) Pain - part of body: Leg(incision on back)     Time: 2440-1027 PT Time Calculation (min) (ACUTE ONLY): 21 min  Charges:  $Gait Training: 8-22  mins                     Donzetta Kohut, [place]  Student Physical Therapist Acute Rehab [phone]    Donzetta Kohut 02/06/2018, 2:57 PM

## 2018-02-07 LAB — GLUCOSE, CAPILLARY: GLUCOSE-CAPILLARY: 138 mg/dL — AB (ref 70–99)

## 2018-02-07 MED ORDER — METHOCARBAMOL 500 MG PO TABS: 500.0000 mg | ORAL_TABLET | Freq: Four times a day (QID) | ORAL | 3 refills | Status: DC | PRN

## 2018-02-07 MED ORDER — OXYCODONE HCL 5 MG PO TABS: 5.0000 mg | ORAL_TABLET | ORAL | 0 refills | Status: DC | PRN

## 2018-02-07 NOTE — Progress Notes (Signed)
Patient ID: Maria [name], female   DOB: 08/10/1940, 77 y.o.   MRN: [id number] All signs are stable Patient still having bilateral leg pain Minimal back discomfort Discharge today

## 2018-02-07 NOTE — Discharge Instructions (Signed)
Wound Care °Leave incision open to air. °You may shower. °Do not scrub directly on incision.  °Do not put any creams, lotions, or ointments on incision. °Activity °Walk each and every day, increasing distance each day. °No lifting greater than 5 lbs.  Avoid excessive neck motion. °No driving for 2 weeks; may ride as a passenger locally. °Diet °Resume your normal diet.  °Return to Work °Will be discussed at you follow up appointment. °Call Your Doctor If Any of These Occur °Redness, drainage, or swelling at the wound.  °Temperature greater than 101 degrees. °Severe pain not relieved by pain medication. °Increased difficulty swallowing. °Incision starts to come apart. °Follow Up Appt °Call today for appointment in 2-3 weeks ([phone]) or for problems.  If you have any hardware placed in your spine, you will need an x-ray before your appointment. °

## 2018-02-07 NOTE — Progress Notes (Signed)
Physical Therapy Treatment Patient Details Name: Maria [name] MRN: [id number] DOB: 04-19-1941 Today's Date: 02/07/2018    History of Present Illness Pt is a 77 y.o. female s/p anterior lateral lumbar fusion at L2-3, L3-4 &L4-5. PMH is significant for HLD, HTN,CAD, PSVT and T2DM.    PT Comments    Pt progressing well towards goals, demonstrating increased motivation to d/c home today. At time of eval pt performing transfers and ambulation with gross min guard, with RW for safety and balance support. Pt denied reports of nausea or weakness s/p ambulation .Pt educated on overall safety with mobility, stair negotiation, and positioning in bed to decrease risk of twisting throughout night. Will continue to follow while admitted and progress as able, per POC.    Follow Up Recommendations  No PT follow up;Supervision/Assistance - 24 hour(pending on pt progression (may need HHPT))     Equipment Recommendations  Rolling walker with 5" wheels(already recieved)    Recommendations for Other Services       Precautions / Restrictions Precautions Precautions: Back Precaution Comments: Pt recalled 3/3 precautions.  Required Braces or Orthoses: Spinal Brace Spinal Brace: Lumbar corset(Already donned prior to session. Doffed in sitting) Restrictions Weight Bearing Restrictions: No    Mobility  Bed Mobility Overal bed mobility: Needs Assistance Bed Mobility: Sit to Sidelying;Rolling Rolling: Supervision     Sit to supine: Min assist Sit to sidelying: Min assist General bed mobility comments: Min A required in order to elevate R LE.  Supervision for rolling and scooting self up in bed.   Transfers Overall transfer level: Needs assistance Equipment used: Rolling walker (2 wheeled) Transfers: Sit to/from Stand Sit to Stand: Min guard         General transfer comment: Min Guard A for safety. Decreased cues required for safe hand placement.   Ambulation/Gait Ambulation/Gait  assistance: Min guard Gait Distance (Feet): 200 Feet Assistive device: Rolling walker (2 wheeled) Gait Pattern/deviations: Step-through pattern;Decreased step length - right;Trunk flexed Gait velocity: decreased Gait velocity interpretation: <1.31 ft/sec, indicative of household ambulator General Gait Details: Pt required min cues for upright posture. Seated rest x1 required prior to negotiating stairs. Cues required to heel strike on RLE as pt continues to demonstrate increased foot flat at initial contact.    Stairs Stairs: Yes Stairs assistance: Min assist Stair Management: One rail Left;Step to pattern;Forwards Number of Stairs: 2 General stair comments: Pt ascended forwards with stronger LE leading, use of rail on the left and HHA+1 on the right. Pt descended forwards with weaker LE leading, use of rail on the right, and use of student therapist(SPT) shoulder on the left with SPT positioned in front of pt for support. Pt required min cues for safety and technique. Pt reported she felt comfortable with stair negotiation and did not need an additional trial.    Wheelchair Mobility    Modified Rankin (Stroke Patients Only)       Balance Overall balance assessment: Needs assistance Sitting-balance support: No upper extremity supported;Feet supported Sitting balance-Leahy Scale: Fair     Standing balance support: Bilateral upper extremity supported;During functional activity Standing balance-Leahy Scale: Fair Standing balance comment: Reliant on Bil UE support. Able to stand statically without UE support.                             Cognition Arousal/Alertness: Awake/alert Behavior During Therapy: WFL for tasks assessed/performed Overall Cognitive Status: Within Functional Limits for tasks assessed  General Comments: Pt with increased motivation to perform stairs and go home at today's session      Exercises       General Comments        Pertinent Vitals/Pain Pain Assessment: Faces Faces Pain Scale: Hurts little more Pain Location: Back at incision Pain Descriptors / Indicators: Discomfort;Grimacing;Guarding;Sore Pain Intervention(s): Limited activity within patient's tolerance;Monitored during session;Repositioned    Home Living                      Prior Function            PT Goals (current goals can now be found in the care plan section) Acute Rehab PT Goals Patient Stated Goal: to go home today PT Goal Formulation: With patient Time For Goal Achievement: 03/14/18 Potential to Achieve Goals: Fair Progress towards PT goals: Progressing toward goals    Frequency    Min 5X/week      PT Plan Current plan remains appropriate    Co-evaluation              AM-PAC PT "6 Clicks" Daily Activity  Outcome Measure  Difficulty turning over in bed (including adjusting bedclothes, sheets and blankets)?: A Little Difficulty moving from lying on back to sitting on the side of the bed? : Unable Difficulty sitting down on and standing up from a chair with arms (e.g., wheelchair, bedside commode, etc,.)?: Unable Help needed moving to and from a bed to chair (including a wheelchair)?: A Little Help needed walking in hospital room?: A Little Help needed climbing 3-5 steps with a railing? : A Little 6 Click Score: 14    End of Session Equipment Utilized During Treatment: Gait belt;Back brace Activity Tolerance: Patient tolerated treatment well Patient left: in bed;with call bell/phone within reach(seated EOB) Nurse Communication: Mobility status PT Visit Diagnosis: Unsteadiness on feet (R26.81);Other abnormalities of gait and mobility (R26.89);Muscle weakness (generalized) (M62.81);Difficulty in walking, not elsewhere classified (R26.2);Pain Pain - part of body: (incision on back)     Time: 4098-1191 PT Time Calculation (min) (ACUTE ONLY): 23 min  Charges:  $Gait  Training: 23-37 mins                     Donzetta Kohut, [place]  Student Physical Therapist Acute Rehab [phone]    Donzetta Kohut 02/07/2018, 1:04 PM

## 2018-02-07 NOTE — Progress Notes (Signed)
Patient is discharged from room 3C04 at this time. Alert and in stable condition. IV site d/c'd and instructions read to patient and spouse with understanding verbalized. Left unit via wheelchair with all belongings at side. 

## 2018-02-07 NOTE — Discharge Summary (Signed)
Physician Discharge Summary  Patient ID: Maria [name] MRN: [id number] DOB/AGE: 78-Feb-1942 78 y.o.  Admit date: 02/04/2018 Discharge date: 02/07/2018  Admission Diagnoses: Spondylosis with radiculopathy and stenosis L2-3 L3-4 L4-5.  Discharge Diagnoses: Spondylosis with radiculopathy and stenosis L2-3 L3-4 L4-5 Active Problems:   Lumbosacral radiculopathy at L4   Discharged Condition: good  Hospital Course: Patient was admitted to undergo surgical decompression and stabilization at L2-3 L3-4 and L4-5.  She tolerated surgery well.  Consults: None She is ambulatory. Significant Diagnostic Studies: None  Treatments: surgery: Anterolateral decompression L3-3 L3-4 L4-5 posterior fixation from L2-L5 arthrodesis with allograft  Discharge Exam: Blood pressure 119/80, pulse 88, temperature 98.8 F (37.1 C), temperature source Oral, resp. rate 16, height 5\' 5"  (1.651 m), weight 75.3 kg, SpO2 97 %. Incision is clean and dry Station and gait are intact  Disposition: Discharge disposition: 01-Home or Self Care       Discharge Instructions    Call MD for:  redness, tenderness, or signs of infection (pain, swelling, redness, odor or green/yellow discharge around incision site)   Complete by:  As directed    Call MD for:  severe uncontrolled pain   Complete by:  As directed    Call MD for:  temperature >100.4   Complete by:  As directed    Diet - low sodium heart healthy   Complete by:  As directed    Discharge instructions   Complete by:  As directed    Okay to shower. Do not apply salves or appointments to incision. No heavy lifting with the upper extremities greater than 15 pounds. May resume driving when not requiring pain medication and patient feels comfortable with doing so.   Incentive spirometry RT   Complete by:  As directed    Increase activity slowly   Complete by:  As directed      Allergies as of 02/07/2018      Reactions   Crestor [rosuvastatin] Other (See  Comments)   MYALGIAS   Ezetimibe-simvastatin Other (See Comments)   MYALGIAS   Pravastatin Other (See Comments)   Myalgias   Nsaids    UNSPECIFIED REACTION    Acetaminophen Anxiety, Other (See Comments)   REACTION: jittery with plain tylenol, but tolerated tylenol/benadryl combination   Carvedilol Other (See Comments)   nightmares   Gabapentin Diarrhea   Glipizide Other (See Comments)   GI intolerance.     Ibuprofen Other (See Comments)   REACTION: GI upset at high doses   Insulin Glargine Rash   Rash   Levemir [insulin Detemir] Rash   Macrobid [nitrofurantoin Macrocrystal] Nausea And Vomiting, Other (See Comments)   ACHES   Metformin Nausea And Vomiting   REACTION: GI upset   Metoprolol Tartrate Other (See Comments)   REACTION: nightmares   Naproxen Nausea And Vomiting   REACTION: GI upset      Medication List    TAKE these medications   Alirocumab 75 MG/ML Sopn Inject 1 pen into the skin every 14 (fourteen) days.   aspirin EC 81 MG tablet Take 81 mg by mouth once.   atenolol 25 MG tablet Commonly known as:  TENORMIN TAKE 1/2 TABLET (12.5 MG TOTAL) BY MOUTH DAILY. What changed:  See the new instructions.   glucose blood test strip USE AS INSTRUCTED TO TEST BLOOD SUGAR THREE TIMES DAILY.   glyBURIDE 5 MG tablet Commonly known as:  DIABETA TAKE 1 TO 2 TABLETS BY MOUTH DAILY WITH BREAKFAST.   insulin degludec 100 UNIT/ML Sopn  FlexTouch Pen Commonly known as:  TRESIBA Inject 0.14 mLs (14 Units total) into the skin daily at 10 pm. What changed:    how much to take  when to take this   lidocaine 5 % Commonly known as:  LIDODERM Place 1 patch onto the skin daily. Remove & Discard patch within 12 hours or as directed by MD What changed:    when to take this  reasons to take this   loperamide 2 MG capsule Commonly known as:  IMODIUM Take 2 mg by mouth 4 (four) times daily as needed for diarrhea or loose stools.   losartan 25 MG tablet Commonly known  as:  COZAAR Take 1 tablet (25 mg total) by mouth daily. Please make an appt with Dr. [name] for October for future refills. What changed:  when to take this   methocarbamol 500 MG tablet Commonly known as:  ROBAXIN Take 1 tablet (500 mg total) by mouth every 6 (six) hours as needed for muscle spasms.   nitroGLYCERIN 0.4 MG SL tablet Commonly known as:  NITROSTAT PLACE 1 TABLET (0.4 MG TOTAL) UNDER THE TONGUE EVERY 5 (FIVE) MINUTES AS NEEDED FOR CHEST PAIN.   omeprazole 20 MG capsule Commonly known as:  PRILOSEC Take 1 capsule (20 mg total) by mouth 2 (two) times daily. What changed:    when to take this  reasons to take this   oxyCODONE 5 MG immediate release tablet Commonly known as:  Oxy IR/ROXICODONE Take 1-2 tablets (5-10 mg total) by mouth every 3 (three) hours as needed for severe pain (for pain.). What changed:  See the new instructions.   Pen Needles 32G X 4 MM Misc 1 each by Does not apply route daily.   traMADol 50 MG tablet Commonly known as:  ULTRAM Take 50 mg by mouth every 6 (six) hours as needed (for pain).            Durable Medical Equipment  (From admission, onward)         Start     Ordered   02/05/18 1024  For home use only DME Walker  Once    Question:  Patient needs a walker to treat with the following condition  Answer:  S/P lumbar spinal fusion   02/05/18 1023           Signed: Brenlyn Beshara J 02/07/2018, 9:27 AM

## 2018-02-13 ENCOUNTER — Other Ambulatory Visit: Payer: Self-pay | Admitting: Cardiovascular Disease

## 2018-02-27 ENCOUNTER — Other Ambulatory Visit: Payer: Medicare Other

## 2018-03-20 ENCOUNTER — Other Ambulatory Visit (HOSPITAL_COMMUNITY): Payer: Self-pay | Admitting: Neurological Surgery

## 2018-03-20 ENCOUNTER — Telehealth (HOSPITAL_COMMUNITY): Payer: Self-pay | Admitting: *Deleted

## 2018-03-20 ENCOUNTER — Ambulatory Visit (HOSPITAL_COMMUNITY)
Admission: RE | Admit: 2018-03-20 | Discharge: 2018-03-20 | Disposition: A | Discharge status: Home / Self Care | Payer: Medicare Other | Source: Ambulatory Visit | Attending: Family | Admitting: Family

## 2018-03-20 DIAGNOSIS — M7989 Other specified soft tissue disorders: Secondary | ICD-10-CM | POA: Insufficient documentation

## 2018-04-08 ENCOUNTER — Other Ambulatory Visit: Payer: Self-pay | Admitting: Cardiovascular Disease

## 2018-04-15 ENCOUNTER — Other Ambulatory Visit: Payer: Medicare Other

## 2018-04-23 ENCOUNTER — Ambulatory Visit: Payer: Medicare Other | Admitting: Family Medicine

## 2018-04-23 ENCOUNTER — Encounter: Payer: Self-pay | Admitting: Family Medicine

## 2018-04-23 VITALS — BP 154/84 | HR 51 | Temp 98.6°F | Ht 65.0 in | Wt 164.0 lb

## 2018-04-23 DIAGNOSIS — Z23 Encounter for immunization: Secondary | ICD-10-CM

## 2018-04-23 DIAGNOSIS — R609 Edema, unspecified: Secondary | ICD-10-CM | POA: Diagnosis not present

## 2018-04-23 LAB — BASIC METABOLIC PANEL
BUN: 14 mg/dL (ref 6–23)
CHLORIDE: 108 meq/L (ref 96–112)
CO2: 29 meq/L (ref 19–32)
CREATININE: 0.9 mg/dL (ref 0.40–1.20)
Calcium: 9.2 mg/dL (ref 8.4–10.5)
GFR: 64.24 mL/min (ref >60.00)
Glucose, Bld: 142 mg/dL — ABNORMAL HIGH (ref 70–99)
Potassium: 3.9 mEq/L (ref 3.5–5.1)
SODIUM: 143 meq/L (ref 135–145)

## 2018-04-23 MED ORDER — INSULIN DEGLUDEC 100 UNIT/ML ~~LOC~~ SOPN: 12.0000 [IU] | PEN_INJECTOR | Freq: Every day | SUBCUTANEOUS | Status: DC

## 2018-04-23 MED ORDER — OMEPRAZOLE 20 MG PO CPDR: 20.0000 mg | DELAYED_RELEASE_CAPSULE | Freq: Two times a day (BID) | ORAL | Status: DC | PRN

## 2018-04-23 NOTE — Patient Instructions (Addendum)
If you have a sugar below 100, then cut back on your insulin by 1 unit on the next dose.   Try to limit ibuprofen.   Stop the lyrica and see if the swelling gets better.   Go to the lab on the way out.  We'll contact you with your lab report.  Take care.  Glad to see you.

## 2018-04-23 NOTE — Progress Notes (Signed)
Flu shot today.   D/w pt about tapering insulin if needed.  Goal is to avoid low sugars.  BLE edema, puffy for the last few weeks.  Started on lyrica since her surgery.  Has been taking ibuprofen 400mg  2-3 times a days.    It seems like her memory is worse on oxycodone.  Family noted it.  She has tried to cut back on her dose/frequency.    See after visit summary.  Meds, vitals, and allergies reviewed.   ROS: Per HPI unless specifically indicated in ROS section   nad ncat Mmm Neck supple, no LA rrr ctab abd soft, not ttp  Able to bear weight, walking slowly. Normal DP pulses B but 1+ BLE edema.

## 2018-04-24 DIAGNOSIS — R609 Edema, unspecified: Secondary | ICD-10-CM | POA: Insufficient documentation

## 2018-04-24 NOTE — Assessment & Plan Note (Signed)
Could be due to combination of ibuprofen and Lyrica or either individually.  She will try to taper her ibuprofen as much as she can.  Reasonable to stop the Lyrica at this point.  She will let me know if her swelling is not improving.  Check basic labs given the swelling.  See notes on labs.  I am hopeful that her memory changes that have been noted by the patient and family may improve when she is off Lyrica and potentially on less oxycodone.  She and her daughter agree to update me as needed.

## 2018-04-25 ENCOUNTER — Telehealth: Payer: Self-pay | Admitting: Family Medicine

## 2018-04-25 NOTE — Telephone Encounter (Signed)
Patient called to get the results of her lab work.  Please call patient. 

## 2018-04-29 ENCOUNTER — Telehealth: Payer: Self-pay | Admitting: Pharmacist

## 2018-04-29 NOTE — Telephone Encounter (Signed)
LMOM for pt - she missed f/u lipid panel to assess Praluent efficacy. Need to r/s lab work.

## 2018-05-09 ENCOUNTER — Telehealth: Payer: Self-pay

## 2018-05-09 ENCOUNTER — Other Ambulatory Visit: Payer: Self-pay | Admitting: Family Medicine

## 2018-05-09 NOTE — Telephone Encounter (Addendum)
Electronic refill request. Tramadol Last office visit:   04/23/18 Last Filled:   12/17/17.  Patient has been taking Oxycodone but patient states she is trying to wean off of that. Please advise.

## 2018-05-09 NOTE — Telephone Encounter (Signed)
Pt called and recently had back surgery; pt taking oxycodone and pt wants to come off the oxycodone and try tramadol for pain mgt. Pt is still seeing surgeon; advised pt to contact surgeon about changing pain med. Dr Para Marchuncan out of office this week. Pt voiced understanding and will contact surgeon.

## 2018-05-13 ENCOUNTER — Telehealth: Payer: Self-pay

## 2018-05-13 ENCOUNTER — Ambulatory Visit: Payer: Medicare Other | Admitting: Internal Medicine

## 2018-05-13 NOTE — Telephone Encounter (Signed)
Noted  

## 2018-05-13 NOTE — Telephone Encounter (Signed)
[facility] faxed note on 05/13/18 that Cordelia PenSherry [facility](DPR signed) wanted to schedule appt due to pain in both feet of pt. Pt can walk with walker but feet hurt; Cordelia PenSherry thinks pt having neuropathy. Lt foot slightly red, no streaks coming from red area. Pt had appt scheduled 05/13/18 with Dr Alphonsus SiasLetvak but pt only wants to see Dr Para Marchuncan who is out of office. Sherry scheduled appt with Dr Para Marchuncan earlier for 05/16/18 at 11:45. Cordelia PenSherry voiced understanding if [facility] worsens [facility] FYI to Dr Para Marchuncan and Dr Sharen HonesGutierrez..[name]

## 2018-05-15 NOTE — Telephone Encounter (Signed)
Sent. Thanks.  She has OV scheduled tomorrow.

## 2018-05-15 NOTE — Telephone Encounter (Signed)
Noted. Thanks. Will see at OV.  

## 2018-05-16 ENCOUNTER — Encounter: Payer: Self-pay | Admitting: Family Medicine

## 2018-05-16 ENCOUNTER — Ambulatory Visit: Payer: Medicare Other | Admitting: Family Medicine

## 2018-05-16 DIAGNOSIS — M792 Neuralgia and neuritis, unspecified: Secondary | ICD-10-CM | POA: Diagnosis not present

## 2018-05-16 MED ORDER — AMITRIPTYLINE HCL 10 MG PO TABS: 10.0000 mg | ORAL_TABLET | Freq: Every day | ORAL | 3 refills | Status: DC

## 2018-05-16 NOTE — Progress Notes (Signed)
Foot pain. She had prev swelling and that got some better off lyrica and with elevation.  She tired to get off oxycodone and transition to tramadol but pain has continued.  She is making some progress from her surgery but she is clearly not back to baseline.  She is taking amitriptyline at night for burning and needle pain in the B feet, L>R.  She is able to tolerate amitriptyline as is.  Hoarse voice over the last week or two.  She doesn't feel sick o/w. Swallowing wnl. No FCNAVD.    Meds, vitals, and allergies reviewed.   ROS: Per HPI unless specifically indicated in ROS section   nad ncat Mmm OP wnl rrr ctab abd soft, not ttp, normal BS Dec sensation to monofilament on B feet.   Skin well perfused. No stridor. Trace bilateral lower extremity edema.  This is improved from previous.

## 2018-05-16 NOTE — Patient Instructions (Signed)
Try taking an extra amitriptyline at night, up to 2 tabs at night for a few nights.  Then increase to 3 if needed.  Sedation caution, you can have a dry mouth or constipation.  Take care.  Glad to see you.

## 2018-05-19 DIAGNOSIS — M792 Neuralgia and neuritis, unspecified: Secondary | ICD-10-CM | POA: Insufficient documentation

## 2018-05-19 NOTE — Assessment & Plan Note (Signed)
She did not tolerate gabapentin and Lyrica.  Her swelling is better in the meantime. Discussed options. She does tolerate amitriptyline as is. She can try taking an extra amitriptyline at night, up to 2 tabs at night for a few nights.  Then increase to 3 if needed.  Sedation caution, can have a dry mouth or constipation, all discussed.  Update me as needed.  The hoarse voice she had noted seems to be incidental and she is well-appearing otherwise.  She could have had a self-limited viral process.  I would observe for now.

## 2018-05-20 ENCOUNTER — Telehealth: Payer: Self-pay | Admitting: Family Medicine

## 2018-05-20 DIAGNOSIS — M792 Neuralgia and neuritis, unspecified: Secondary | ICD-10-CM

## 2018-05-20 NOTE — Telephone Encounter (Signed)
Daughter [name] (on dpr) called office requesting to have a physical therapist come out to the home to help her mother. She stated her mother's balance has been off lately and is wondering if a order. Best cb # [phone]

## 2018-05-21 NOTE — Telephone Encounter (Signed)
She may qualify because she is not able to leave the house except with assistance.  I put in the referral.  Thanks.

## 2018-05-21 NOTE — Addendum Note (Signed)
Addended by: Joaquim Nam on: 05/21/2018 06:12 AM   Modules accepted: Orders

## 2018-05-21 NOTE — Telephone Encounter (Signed)
Daughter, [name], advised.

## 2018-05-29 ENCOUNTER — Telehealth: Payer: Self-pay | Admitting: Family Medicine

## 2018-05-29 NOTE — Telephone Encounter (Signed)
Maria [name],[place], needs a verbal order for 1 x 1 wk for this week and 2 x a week for 4 weeks for physical therapy.

## 2018-05-30 NOTE — Telephone Encounter (Signed)
Lafonda Mosses at [place] advised.

## 2018-05-30 NOTE — Telephone Encounter (Signed)
Please give the order.  Thanks.   

## 2018-06-12 DIAGNOSIS — M5417 Radiculopathy, lumbosacral region: Secondary | ICD-10-CM | POA: Diagnosis not present

## 2018-06-12 DIAGNOSIS — I2510 Atherosclerotic heart disease of native coronary artery without angina pectoris: Secondary | ICD-10-CM

## 2018-06-12 DIAGNOSIS — K219 Gastro-esophageal reflux disease without esophagitis: Secondary | ICD-10-CM

## 2018-06-12 DIAGNOSIS — M792 Neuralgia and neuritis, unspecified: Secondary | ICD-10-CM

## 2018-06-12 DIAGNOSIS — M21372 Foot drop, left foot: Secondary | ICD-10-CM

## 2018-06-12 DIAGNOSIS — I739 Peripheral vascular disease, unspecified: Secondary | ICD-10-CM

## 2018-06-12 DIAGNOSIS — M5431 Sciatica, right side: Secondary | ICD-10-CM

## 2018-06-12 DIAGNOSIS — E1159 Type 2 diabetes mellitus with other circulatory complications: Secondary | ICD-10-CM

## 2018-06-12 DIAGNOSIS — Z9181 History of falling: Secondary | ICD-10-CM

## 2018-06-12 DIAGNOSIS — Z79891 Long term (current) use of opiate analgesic: Secondary | ICD-10-CM

## 2018-06-13 ENCOUNTER — Ambulatory Visit: Payer: Medicare Other | Admitting: Family Medicine

## 2018-06-13 ENCOUNTER — Encounter: Payer: Self-pay | Admitting: Family Medicine

## 2018-06-13 VITALS — BP 122/68 | HR 64 | Temp 98.3°F | Ht 65.0 in

## 2018-06-13 DIAGNOSIS — E1159 Type 2 diabetes mellitus with other circulatory complications: Secondary | ICD-10-CM

## 2018-06-13 DIAGNOSIS — G629 Polyneuropathy, unspecified: Secondary | ICD-10-CM | POA: Insufficient documentation

## 2018-06-13 LAB — VITAMIN B12: Vitamin B-12: 149 pg/mL — ABNORMAL LOW (ref 211–911)

## 2018-06-13 LAB — TSH: TSH: 2.13 u[IU]/mL (ref 0.35–4.50)

## 2018-06-13 LAB — HEMOGLOBIN A1C: Hgb A1c MFr Bld: 5.8 % (ref 4.6–6.5)

## 2018-06-13 NOTE — Assessment & Plan Note (Addendum)
She is s/p Lumbar two-three Lumbar three-four Lumbar four-five  anterolateral decompression/percutaneous posterior arthrodesis.  Her R leg sx are better but she has neuropathic pain in the L leg.  She has h/o weakness with plantar and dorsiflexion in the L foot, both improving with home PT.    She has h/o DM2, with recheck A1c pending along with TSH and B12. Reasonable to see neurology, referred.    She is going to check with Dr. Danielle Dess about restarting amitriptyline in the meantime.  I'll defer, she agrees.  Hopefully she'll be able to wean down on oxycodone use.    >25 minutes spent in face to face time with patient, >50% spent in counselling or coordination of care.

## 2018-06-13 NOTE — Progress Notes (Signed)
S/p Lumbar two-three Lumbar three-four Lumbar four-five  anterolateral decompression/percutaneous posterior arthrodesis.  She is praluent at baseline and tolerating that.    She tried coming off amitriptyline but that didn't help with balance/gait troubles (this was reasonably stopped on the advice of Dr. Danielle Dess).  Being on amitriptyline helped with pain, when she was taking it prev. She is going to update Dr. Danielle Dess tomorrow about her situation and med use.    She is taking oxycodone as needed, not taking tramadol recently.  She had inc oxycodone use (about 2 per day) while off amitriptyline.    She continues to have balance troubles, some better but not back to baseline.  She has been in PT with some benefit.  She thought therapy helped more than stopping amitriptyline.  She is still working on her home exercises, outside of PT.    She has a change in sensation in L>R leg.  Burning and tingling in the L>R leg.  No arm sx.  Rare R leg sx.   DM2, due for A1c and f/u with endo.  Sugar was 98 yesterday, 101 this AM.  Compliant with meds.   PMH and SH reviewed  ROS: Per HPI unless specifically indicated in ROS section   Meds, vitals, and allergies reviewed.   nad ncat Neck supple no LA rrr ctab abd soft Ext w/o edema.   She had a paroxysm of pain in the L leg at the OV w/o clear trigger- unfortunately this is typical for patient.  She has weaker dorsiflexion and plantar flexion on the L foot (however both are some better per patient report with therapy)  Normal R foot dorsiflexion and plantar flexion.

## 2018-06-13 NOTE — Patient Instructions (Signed)
Go to the lab on the way out.  We'll contact you with your lab report.  We make arrangements for referrals, extra imaging, and other appointments based on the urgency of the situation. Referrals are handled based on the clinical situation, not in the order that they are placed. If you do not see one of our referral coordinators on the way out of the clinic today, then you should expect a call in the next 1 to 2 weeks. We work diligently to process all referrals as quickly as possible.    Don't change your meds for now.  Take care.  Glad to see you.  Keep working with therapy.

## 2018-06-13 NOTE — Assessment & Plan Note (Signed)
See notes on A1c. ?

## 2018-06-16 ENCOUNTER — Other Ambulatory Visit: Payer: Self-pay | Admitting: Family Medicine

## 2018-06-16 ENCOUNTER — Encounter: Payer: Self-pay | Admitting: Family Medicine

## 2018-06-16 DIAGNOSIS — E538 Deficiency of other specified B group vitamins: Secondary | ICD-10-CM | POA: Insufficient documentation

## 2018-06-16 MED ORDER — CYANOCOBALAMIN 1000 MCG/ML IJ SOLN: INTRAMUSCULAR | Status: DC

## 2018-06-17 ENCOUNTER — Other Ambulatory Visit: Payer: Self-pay

## 2018-06-17 ENCOUNTER — Observation Stay (HOSPITAL_COMMUNITY)
Admission: EM | Admit: 2018-06-17 | Discharge: 2018-06-17 | Disposition: A | Discharge status: Home / Self Care | Payer: Medicare Other | Attending: Internal Medicine | Admitting: Internal Medicine

## 2018-06-17 ENCOUNTER — Encounter (HOSPITAL_COMMUNITY): Payer: Self-pay | Admitting: Emergency Medicine

## 2018-06-17 DIAGNOSIS — N3001 Acute cystitis with hematuria: Secondary | ICD-10-CM | POA: Diagnosis not present

## 2018-06-17 DIAGNOSIS — R630 Anorexia: Secondary | ICD-10-CM | POA: Diagnosis not present

## 2018-06-17 DIAGNOSIS — Z8249 Family history of ischemic heart disease and other diseases of the circulatory system: Secondary | ICD-10-CM | POA: Insufficient documentation

## 2018-06-17 DIAGNOSIS — F329 Major depressive disorder, single episode, unspecified: Secondary | ICD-10-CM | POA: Diagnosis not present

## 2018-06-17 DIAGNOSIS — E860 Dehydration: Secondary | ICD-10-CM | POA: Insufficient documentation

## 2018-06-17 DIAGNOSIS — M545 Low back pain, unspecified: Secondary | ICD-10-CM

## 2018-06-17 DIAGNOSIS — I4510 Unspecified right bundle-branch block: Secondary | ICD-10-CM | POA: Diagnosis not present

## 2018-06-17 DIAGNOSIS — B9689 Other specified bacterial agents as the cause of diseases classified elsewhere: Secondary | ICD-10-CM | POA: Insufficient documentation

## 2018-06-17 DIAGNOSIS — E119 Type 2 diabetes mellitus without complications: Secondary | ICD-10-CM | POA: Diagnosis not present

## 2018-06-17 DIAGNOSIS — M5136 Other intervertebral disc degeneration, lumbar region: Secondary | ICD-10-CM | POA: Diagnosis not present

## 2018-06-17 DIAGNOSIS — Z888 Allergy status to other drugs, medicaments and biological substances status: Secondary | ICD-10-CM | POA: Insufficient documentation

## 2018-06-17 DIAGNOSIS — R531 Weakness: Secondary | ICD-10-CM | POA: Diagnosis present

## 2018-06-17 DIAGNOSIS — M5416 Radiculopathy, lumbar region: Secondary | ICD-10-CM | POA: Insufficient documentation

## 2018-06-17 DIAGNOSIS — I10 Essential (primary) hypertension: Secondary | ICD-10-CM | POA: Diagnosis not present

## 2018-06-17 DIAGNOSIS — J3489 Other specified disorders of nose and nasal sinuses: Secondary | ICD-10-CM | POA: Diagnosis not present

## 2018-06-17 DIAGNOSIS — E785 Hyperlipidemia, unspecified: Secondary | ICD-10-CM | POA: Insufficient documentation

## 2018-06-17 DIAGNOSIS — G8929 Other chronic pain: Secondary | ICD-10-CM

## 2018-06-17 DIAGNOSIS — E538 Deficiency of other specified B group vitamins: Secondary | ICD-10-CM | POA: Insufficient documentation

## 2018-06-17 DIAGNOSIS — Z886 Allergy status to analgesic agent status: Secondary | ICD-10-CM | POA: Insufficient documentation

## 2018-06-17 DIAGNOSIS — I444 Left anterior fascicular block: Secondary | ICD-10-CM | POA: Insufficient documentation

## 2018-06-17 DIAGNOSIS — I2510 Atherosclerotic heart disease of native coronary artery without angina pectoris: Secondary | ICD-10-CM | POA: Diagnosis not present

## 2018-06-17 DIAGNOSIS — Z833 Family history of diabetes mellitus: Secondary | ICD-10-CM | POA: Insufficient documentation

## 2018-06-17 DIAGNOSIS — Z79899 Other long term (current) drug therapy: Secondary | ICD-10-CM | POA: Insufficient documentation

## 2018-06-17 DIAGNOSIS — Z794 Long term (current) use of insulin: Secondary | ICD-10-CM | POA: Diagnosis not present

## 2018-06-17 DIAGNOSIS — Z79891 Long term (current) use of opiate analgesic: Secondary | ICD-10-CM | POA: Insufficient documentation

## 2018-06-17 DIAGNOSIS — N39498 Other specified urinary incontinence: Secondary | ICD-10-CM | POA: Diagnosis not present

## 2018-06-17 DIAGNOSIS — Z881 Allergy status to other antibiotic agents status: Secondary | ICD-10-CM | POA: Insufficient documentation

## 2018-06-17 DIAGNOSIS — Z7982 Long term (current) use of aspirin: Secondary | ICD-10-CM | POA: Insufficient documentation

## 2018-06-17 DIAGNOSIS — Z955 Presence of coronary angioplasty implant and graft: Secondary | ICD-10-CM | POA: Diagnosis not present

## 2018-06-17 LAB — BASIC METABOLIC PANEL
Anion gap: 12 (ref 5–15)
BUN: 16 mg/dL (ref 8–23)
CO2: 22 mmol/L (ref 22–32)
Calcium: 9.6 mg/dL (ref 8.9–10.3)
Chloride: 107 mmol/L (ref 98–111)
Creatinine, Ser: 1.02 mg/dL — ABNORMAL HIGH (ref 0.44–1.00)
GFR calc non Af Amer: 53 mL/min — ABNORMAL LOW (ref >60)
Glucose, Bld: 172 mg/dL — ABNORMAL HIGH (ref 70–99)
Potassium: 4.4 mmol/L (ref 3.5–5.1)
SODIUM: 141 mmol/L (ref 135–145)

## 2018-06-17 LAB — I-STAT TROPONIN, ED: TROPONIN I, POC: 0 ng/mL (ref 0.00–0.08)

## 2018-06-17 LAB — URINALYSIS, ROUTINE W REFLEX MICROSCOPIC
Bilirubin Urine: NEGATIVE
Glucose, UA: NEGATIVE mg/dL
KETONES UR: 5 mg/dL — AB
LEUKOCYTES UA: NEGATIVE
Nitrite: POSITIVE — AB
PH: 5 (ref 5.0–8.0)
Protein, ur: NEGATIVE mg/dL
Specific Gravity, Urine: 1.019 (ref 1.005–1.030)

## 2018-06-17 LAB — CBC WITH DIFFERENTIAL/PLATELET
Abs Immature Granulocytes: 0.03 10*3/uL (ref 0.00–0.07)
BASOS ABS: 0 10*3/uL (ref 0.0–0.1)
Basophils Relative: 0 %
EOS ABS: 0.2 10*3/uL (ref 0.0–0.5)
Eosinophils Relative: 3 %
HEMATOCRIT: 41.9 % (ref 36.0–46.0)
Hemoglobin: 13.5 g/dL (ref 12.0–15.0)
IMMATURE GRANULOCYTES: 0 %
LYMPHS ABS: 2.2 10*3/uL (ref 0.7–4.0)
Lymphocytes Relative: 29 %
MCH: 27.8 pg (ref 26.0–34.0)
MCHC: 32.2 g/dL (ref 30.0–36.0)
MCV: 86.4 fL (ref 80.0–100.0)
Monocytes Absolute: 0.5 10*3/uL (ref 0.1–1.0)
Monocytes Relative: 7 %
NEUTROS PCT: 61 %
NRBC: 0 % (ref 0.0–0.2)
Neutro Abs: 4.6 10*3/uL (ref 1.7–7.7)
Platelets: 355 10*3/uL (ref 150–400)
RBC: 4.85 MIL/uL (ref 3.87–5.11)
RDW: 13.4 % (ref 11.5–15.5)
WBC: 7.5 10*3/uL (ref 4.0–10.5)

## 2018-06-17 MED ORDER — AMITRIPTYLINE HCL 10 MG PO TABS: 10.0000 mg | ORAL_TABLET | Freq: Every day | ORAL | Status: DC

## 2018-06-17 MED ORDER — SODIUM CHLORIDE 0.9 % IV BOLUS
1000.0000 mL | Freq: Once | INTRAVENOUS | Status: AC
  Administered 2018-06-17: 1000 mL via INTRAVENOUS

## 2018-06-17 MED ORDER — AMITRIPTYLINE HCL 10 MG PO TABS
10.0000 mg | ORAL_TABLET | Freq: Every day | ORAL | Status: DC
  Administered 2018-06-17: 10 mg via ORAL
  Filled 2018-06-17 (×2): qty 1

## 2018-06-17 MED ORDER — PANTOPRAZOLE SODIUM 40 MG PO TBEC: 40.0000 mg | DELAYED_RELEASE_TABLET | Freq: Every day | ORAL | Status: DC

## 2018-06-17 MED ORDER — TRAMADOL HCL 50 MG PO TABS: 100.0000 mg | ORAL_TABLET | Freq: Four times a day (QID) | ORAL | Status: DC | PRN

## 2018-06-17 MED ORDER — LOPERAMIDE HCL 2 MG PO CAPS: 2.0000 mg | ORAL_CAPSULE | Freq: Four times a day (QID) | ORAL | Status: DC | PRN

## 2018-06-17 MED ORDER — ASPIRIN EC 81 MG PO TBEC: 81.0000 mg | DELAYED_RELEASE_TABLET | Freq: Once | ORAL | Status: DC

## 2018-06-17 MED ORDER — ATENOLOL 25 MG PO TABS: 12.5000 mg | ORAL_TABLET | Freq: Every day | ORAL | Status: DC

## 2018-06-17 MED ORDER — CEPHALEXIN 250 MG PO CAPS: 250.0000 mg | ORAL_CAPSULE | Freq: Four times a day (QID) | ORAL | 0 refills | Status: DC

## 2018-06-17 MED ORDER — CEPHALEXIN 250 MG PO CAPS
500.0000 mg | ORAL_CAPSULE | Freq: Once | ORAL | Status: AC
  Administered 2018-06-17: 500 mg via ORAL
  Filled 2018-06-17: qty 2

## 2018-06-17 MED ORDER — LACTATED RINGERS IV SOLN: INTRAVENOUS | Status: DC

## 2018-06-17 MED ORDER — OXYCODONE HCL 5 MG PO TABS
10.0000 mg | ORAL_TABLET | Freq: Once | ORAL | Status: AC
  Administered 2018-06-17: 10 mg via ORAL
  Filled 2018-06-17: qty 2

## 2018-06-17 NOTE — ED Notes (Signed)
Pt ambulatory to bathroom

## 2018-06-17 NOTE — ED Notes (Signed)
Pt given water to drink. 

## 2018-06-17 NOTE — ED Provider Notes (Addendum)
Medical screening examination/treatment/procedure(s) were conducted as a shared visit with non-physician practitioner(s) and myself.  I personally evaluated the patient during the encounter.  None   Patient has had increasing lower back pain for about 2 days.  She then started getting pain and difficulty with urination.  Patient reports she started feel generally weak.  She also has pain in the front of both of her legs.  Patient has had surgical decompression and stabilization at L to 3 L3-4 and L3 5 with posterior fixation 9/ 2019.  Patient is fairly mobile in her home using a walker.  She reports today she was just to generally weak, fatigued and dizzy. Patient is alert and interactive.  Abdomen soft and nontender without guarding.  Positive rectal tone.  2+ patellar reflexes.  Patient can push against resistance bilateral lower extremities.  Advise for admission for elderly patient with UTI and significant weakness with impaired gait and function at home.  After hydration patient continues to be dizzy and weak with position change and too weak to ambulate at baseline.  Patient was extremely adverse to undergoing MRI despite offer of anxiolysis.  PA-C reviewed with hospitalist for admission with hospitalist determining that patient did not require admission.  Patient determined to ambulate rather than undergo MRI or further assessment for admission.  Patient discharged with very low threshold for readmission.   Arby BarrettePfeiffer, Waino Mounsey, MD 06/17/18 1623    Arby BarrettePfeiffer, Shilpa Bushee, MD 06/17/18 1625    Arby BarrettePfeiffer, Russel Morain, MD 06/23/18 [phone]

## 2018-06-17 NOTE — ED Provider Notes (Addendum)
[facility] EMERGENCY DEPARTMENT Provider Note   CSN: [id number] Arrival date & time: 06/17/18  1056   History   Chief Complaint Chief Complaint  Patient presents with  . flu like sx  . possible uti     HPI Maria [name] is a 78 y.o. female with a PMH of CAD, HTN, chronic back pain, and T2DM presenting with diffuse weakness onset 3 days ago. Patient arrived via EMS. Patient reports she is typically able to ambulate with a walker, but she has been feeling more weak over the past few days. Patient reports two episodes of urinary incontinence and states her urine has a strong odor. Patient denies burning or frequency. Patient reports mild suprapubic tenderness, but denies nausea, vomiting, or diarrhea. Patient reports last BM was yesterday and it was normal. Patient reports loss of appetite. Patient denies fever, but reports chills and sweats. Patient reports rhinorrhea onset this morning. Patient denies cough, congestion, sore throat, chest pain, or shortness of breath. Patient reports chronic back pain and states pain is managed at home with oxycodone. Patient had a surgical decompression at L3-4 and L4-5. Denies numbness, tingling, fever, chills, IV drug use, or hx of cancer. Patient denies taking any medications today and states her medications have not been changed recently. Patient denies any falls or sick contacts.    HPI  Past Medical History:  Diagnosis Date  . Anemia 05/1985  . Carotid artery disease (HCC)    a. 02/2015 - 1-39% bilaterally.  . Chronic back pain   . Concussion   . Coronary artery disease    a. s/p DES to [facility] 01/2015.  . DDD (degenerative disc disease), cervical   . DDD (degenerative disc disease), lumbar   . Fracture of lower leg 07/2001   Right  . GERD (gastroesophageal reflux disease)   . Hyperlipidemia   . Hypertension   . Multinodular thyroid   . Pneumonia ?1990's X 1  . PSVT (paroxysmal supraventricular tachycardia) (HCC)    a. s/p  catheter ablation of a parahisian atrial tachycardia which was successfully ablated from the non-coronary cusp of the aortic root 06/2014.  [name] Renal cyst 07/10/2006   bilateral  . Right knee pain 02/2010   Injected - Dr. Shelle Iron  . Sinus pause    a. By event monitoring 09/2014 - BB stopped at that time.  . Syncope and collapse 07/2001   "that's when I broke my leg"  . Type II diabetes mellitus (HCC) 2002    Patient Active Problem List   Diagnosis Date Noted  . Weakness 06/17/2018  . B12 deficiency 06/16/2018  . Neuropathy 06/13/2018  . Neuropathic pain 05/19/2018  . Edema 04/24/2018  . Lumbosacral radiculopathy at L4 02/04/2018  . Right sided sciatica 10/02/2017  . Neck pain 01/02/2017  . Bruising 09/19/2015  . Dysuria 06/15/2015  . Carotid artery disease (HCC)   . Concussion with no loss of consciousness 02/11/2015  . Coronary atherosclerosis of native coronary artery 01/22/2015  . Angina pectoris (HCC) 01/22/2015  . Chest pain 11/18/2014  . Sinus node dysfunction (HCC) 11/02/2014  . SVT (supraventricular tachycardia) (HCC) 07/06/2014  . Benign paroxysmal positional vertigo 05/21/2014  . Weight loss 06/09/2012  . Functional diarrhea 02/01/2012  . Type 2 diabetes mellitus with vascular disease (HCC) 02/13/2011  . Shoulder pain, left 09/12/2010  . ADJUSTMENT REACTION, ADULT 05/17/2010  . KNEE PAIN, RIGHT 10/01/2009  . SYNCOPE 02/22/2009  . PSVT 11/12/2008  . GERD 11/12/2008  . RENAL CYST 11/12/2008  .  Backache 11/12/2008  . Multiple thyroid nodules 08/07/2007  . Reflux esophagitis 08/07/2007  . Hyperlipidemia 08/21/2006  . ANEMIA-NOS 08/21/2006  . DEPRESSION 08/21/2006  . Essential hypertension 08/21/2006  . DEGENERATIVE DISC DISEASE, CERVICAL SPINE 08/21/2006  . DEGENERATIVE DISC DISEASE, LUMBAR SPINE 08/21/2006    Past Surgical History:  Procedure Laterality Date  . ANTERIOR CERVICAL DECOMP/DISCECTOMY FUSION  10/03/2005   C4/5; C5/6  "it's got a plate in there"  .  ANTERIOR LAT LUMBAR FUSION Left 02/04/2018   Procedure: Lumbar two-three Lumbar three-four Lumbar four-five  Anterolateral decompression/percutaneous posterior arthrodesis, Mazor;  Surgeon: Barnett Abu, MD;  Location: [facility] OR;  Service: Neurosurgery;  Laterality: Left;  . APPLICATION OF ROBOTIC ASSISTANCE FOR SPINAL PROCEDURE N/A 02/04/2018   Procedure: APPLICATION OF ROBOTIC ASSISTANCE FOR SPINAL PROCEDURE;  Surgeon: Barnett Abu, MD;  Location: [facility] OR;  Service: Neurosurgery;  Laterality: N/A;  . BACK SURGERY    . BREAST BIOPSY Bilateral 1968 (multiple)   "all benign"  . BREAST LUMPECTOMY Bilateral 1968  . CARDIAC CATHETERIZATION N/A 11/18/2014   Procedure: Left Heart Cath and Coronary Angiography;  Surgeon: Peter M Swaziland, MD;  Location: [facility] INVASIVE CV LAB;  Service: Cardiovascular;  Laterality: N/A;  . CARDIAC CATHETERIZATION N/A 01/22/2015   Procedure: Coronary Stent Intervention;  Surgeon: Peter M Swaziland, MD;  Location: [facility] INVASIVE CV LAB;  Service: Cardiovascular;  Laterality: N/A;  . CARDIAC CATHETERIZATION N/A 06/10/2015   Procedure: Left Heart Cath and Coronary Angiography;  Surgeon: Peter M Swaziland, MD;  Location: [facility] INVASIVE CV LAB;  Service: Cardiovascular;  Laterality: N/A;  . CATARACT EXTRACTION W/ INTRAOCULAR LENS  IMPLANT, BILATERAL Bilateral 12/2010  . CORONARY ANGIOPLASTY    . ESOPHAGOGASTRODUODENOSCOPY  08/14/2005   gastropathy biopsy, negative  . LAPAROSCOPIC CHOLECYSTECTOMY  08/1992  . LUMBAR PERCUTANEOUS PEDICLE SCREW 3 LEVEL N/A 02/04/2018   Procedure: LUMBAR PERCUTANEOUS PEDICLE SCREW LUMBAR TWO - LUMBAR FIVE;  Surgeon: Barnett Abu, MD;  Location: [facility] OR;  Service: Neurosurgery;  Laterality: N/A;  . POSTERIOR LAMINECTOMY / DECOMPRESSION LUMBAR SPINE  09/2001   due to herniated disc  . SUPRAVENTRICULAR TACHYCARDIA ABLATION N/A 07/06/2014   Procedure: SUPRAVENTRICULAR TACHYCARDIA ABLATION;  Surgeon: Marinus Maw, MD;  Location: [facility] CATH LAB;  Service: Cardiovascular;   Laterality: N/A;  . VAGINAL HYSTERECTOMY  1968   spotting     OB History   No obstetric history on file.      Home Medications    Prior to Admission medications   Medication Sig Start Date End Date Taking? Authorizing Provider  Alirocumab (PRALUENT) 75 MG/ML SOPN Inject 1 pen into the skin every 14 (fourteen) days. 01/21/18   Wendall Stade, MD  amitriptyline (ELAVIL) 10 MG tablet Take 1-3 tablets (10-30 mg total) by mouth at bedtime. Patient not taking: Reported on 06/13/2018 05/16/18   Joaquim Nam, MD  aspirin EC 81 MG tablet Take 81 mg by mouth once.     [provider]  atenolol (TENORMIN) 25 MG tablet Take 0.5 tablets (12.5 mg total) by mouth daily. 04/08/18   Wendall Stade, MD  cephALEXin (KEFLEX) 250 MG capsule Take 1 capsule (250 mg total) by mouth 4 (four) times daily for 7 days. 06/17/18 06/24/18  Carlyle Basques P, PA-C  cyanocobalamin (,VITAMIN B-12,) 1000 MCG/ML injection 1000 mcg injected IM weekly x4 doses then monthly thereafter. 06/16/18   Joaquim Nam, MD  glucose blood (ONE TOUCH ULTRA TEST) test strip USE AS INSTRUCTED TO TEST BLOOD SUGAR THREE TIMES DAILY. 10/18/17  Joaquim Nam, MD  glyBURIDE (DIABETA) 5 MG tablet TAKE 1 TO 2 TABLETS BY MOUTH DAILY WITH BREAKFAST. 01/23/18   Joaquim Nam, MD  insulin degludec (TRESIBA FLEXTOUCH) 100 UNIT/ML SOPN FlexTouch Pen Inject 0.12 mLs (12 Units total) into the skin daily at 10 pm. 04/23/18   Joaquim Nam, MD  Insulin Pen Needle (PEN NEEDLES) 32G X 4 MM MISC 1 each by Does not apply route daily. 05/18/17   Carlus Pavlov, MD  loperamide (IMODIUM) 2 MG capsule Take 2 mg by mouth 4 (four) times daily as needed for diarrhea or loose stools.     [provider]  losartan (COZAAR) 25 MG tablet Take 1 tablet (25 mg total) by mouth daily. 02/13/18   Wendall Stade, MD  nitroGLYCERIN (NITROSTAT) 0.4 MG SL tablet PLACE 1 TABLET (0.4 MG TOTAL) UNDER THE TONGUE EVERY 5 (FIVE) MINUTES AS NEEDED FOR CHEST PAIN.  07/26/17   Rosalio Macadamia, NP  omeprazole (PRILOSEC) 20 MG capsule Take 1 capsule (20 mg total) by mouth 2 (two) times daily as needed (for acid reflux/indigestion.). 04/23/18   Joaquim Nam, MD  oxyCODONE (OXY IR/ROXICODONE) 5 MG immediate release tablet Take 1-2 tablets (5-10 mg total) by mouth every 3 (three) hours as needed for severe pain (for pain.). Patient taking differently: Take 10 mg by mouth every 3 (three) hours as needed for severe pain (for pain.).  02/07/18   Barnett Abu, MD  traMADol (ULTRAM) 50 MG tablet TAKE 1 TABLET BY MOUTH EVERY 6 HOURS AS NEEDED FOR MODERATE PAIN. MAY CAUSE SEDATION. Patient not taking: Reported on 06/13/2018 05/15/18   Joaquim Nam, MD    Family History Family History  Problem Relation Age of Onset  . Hypertension Mother   . Heart disease Mother   . Diabetes Sister   . Myasthenia gravis Sister   . Diabetes Brother        1/2 juvenile; foot ulcer; periph. neuropathy  . Heart disease Maternal Grandfather        MI  . Hypertension Maternal Grandfather   . Depression Paternal Aunt   . Hypertension Maternal Grandmother   . Diabetes Maternal Grandmother   . Stroke Maternal Grandmother   . Hypertension Paternal Grandmother   . Diabetes Paternal Grandmother   . Hypertension Paternal Grandfather   . Diabetes Paternal Grandfather   . Breast cancer Maternal Aunt   . Colon cancer Neg Hx     Social History Social History   Tobacco Use  . Smoking status: Never Smoker  . Smokeless tobacco: Never Used  Substance Use Topics  . Alcohol use: No    Alcohol/week: 0.0 standard drinks  . Drug use: No     Allergies   Crestor [rosuvastatin]; Ezetimibe-simvastatin; Pravastatin; Lyrica [pregabalin]; Nsaids; Acetaminophen; Carvedilol; Gabapentin; Glipizide; Ibuprofen; Insulin glargine; Levemir [insulin detemir]; Macrobid [nitrofurantoin macrocrystal]; Metformin; Metoprolol tartrate; and Naproxen   Review of Systems Review of Systems    Constitutional: Positive for appetite change, chills, diaphoresis and fatigue. Negative for activity change, fever and unexpected weight change.  HENT: Positive for rhinorrhea. Negative for congestion and sore throat.   Eyes: Negative for visual disturbance.  Respiratory: Negative for cough and shortness of breath.   Cardiovascular: Negative for chest pain.  Gastrointestinal: Positive for abdominal pain. Negative for constipation, diarrhea, nausea and vomiting.  Endocrine: Negative for polydipsia, polyphagia and polyuria.  Genitourinary: Negative for dysuria, flank pain, frequency, vaginal bleeding and vaginal discharge.       Pt reports  urinary odor and chronic urinary incontinence.  Musculoskeletal: Positive for back pain and gait problem. Negative for myalgias and neck pain.  Skin: Negative for rash.  Neurological: Positive for dizziness and weakness. Negative for syncope, speech difficulty, numbness and headaches.  Psychiatric/Behavioral: The patient is not nervous/anxious.     Physical Exam Updated Vital Signs BP (!) 148/127   Pulse 72   Temp 97.7 F (36.5 C) (Oral)   Resp 18   Ht 5\' 5"  (1.651 m)   Wt 66.7 kg   SpO2 99%   BMI 24.46 kg/m   Physical Exam Vitals signs and nursing note reviewed.  Constitutional:      General: She is not in acute distress.    Appearance: She is well-developed. She is not diaphoretic.  HENT:     Head: Normocephalic and atraumatic.     Right Ear: Tympanic membrane, ear canal and external ear normal.     Left Ear: Tympanic membrane, ear canal and external ear normal.     Nose: Nose normal.     Mouth/Throat:     Mouth: Mucous membranes are moist.     Pharynx: No oropharyngeal exudate or posterior oropharyngeal erythema.  Eyes:     Extraocular Movements: Extraocular movements intact.     Conjunctiva/sclera: Conjunctivae normal.     Pupils: Pupils are equal, round, and reactive to light.  Neck:     Musculoskeletal: Normal range of motion and  neck supple.  Cardiovascular:     Rate and Rhythm: Normal rate and regular rhythm.     Heart sounds: Normal heart sounds. No murmur. No friction rub. No gallop.   Pulmonary:     Effort: Pulmonary effort is normal. No respiratory distress.     Breath sounds: Normal breath sounds. No wheezing or rales.  Abdominal:     General: Bowel sounds are normal. There is no distension.     Palpations: Abdomen is soft. Abdomen is not rigid. There is no mass.     Tenderness: There is abdominal tenderness in the suprapubic area. There is no right CVA tenderness, left CVA tenderness, guarding or rebound.     Hernia: No hernia is present.  Genitourinary:    Rectum: Normal. Normal anal tone.     Comments: Rectal exam performed by Dr. Donnald GarrePfeiffer. Musculoskeletal:     Cervical back: Normal. She exhibits normal range of motion, no tenderness and no bony tenderness.     Thoracic back: She exhibits decreased range of motion. She exhibits no tenderness and no bony tenderness.     Lumbar back: She exhibits decreased range of motion. She exhibits no tenderness and no bony tenderness.     Comments: No skin changes noted. Scars noted from previous back surgery. No erythema or discharge noted. No midline tenderness noted on cervical, thoracic, or lumbar spine. Mild chronic paraspinal tenderness bilaterally on thoracic and lumbar spine. Chronic decreased ROM due to chronic back pain. Negative straight leg. Sensation intact. 5/5 strength in lower extremities with dorsiflexion and plantar flexion. Patient is able to ambulate with assistance.  Skin:    General: Skin is warm.     Findings: No rash.  Neurological:     Mental Status: She is alert and oriented to person, place, and time.    Mental Status:  Alert, oriented, thought content appropriate, able to give a coherent history. Speech fluent without evidence of aphasia. Able to follow 2 step commands without difficulty.  Cranial Nerves:  II:  Peripheral visual fields  grossly normal,  pupils equal, round, reactive to light III,IV, VI: ptosis not present, extra-ocular motions intact bilaterally  V,VII: smile symmetric, facial light touch sensation equal VIII: hearing grossly normal to voice  X: uvula elevates symmetrically  XI: bilateral shoulder shrug symmetric and strong XII: midline tongue extension without fassiculations Motor:  Normal tone. 5/5 in upper and lower extremities bilaterally including strong and equal grip strength and dorsiflexion/plantar flexion Sensory: light touch normal in all extremities.  Deep Tendon Reflexes: 2+ and symmetric in the biceps and patella Cerebellar: normal finger-to-nose with bilateral upper extremities Gait: normal gait and balance with assistance CV: distal pulses palpable throughout    ED Treatments / Results  Labs (all labs ordered are listed, but only abnormal results are displayed) Labs Reviewed  URINALYSIS, ROUTINE W REFLEX MICROSCOPIC - Abnormal; Notable for the following components:      Result Value   Color, Urine AMBER (*)    APPearance HAZY (*)    Hgb urine dipstick SMALL (*)    Ketones, ur 5 (*)    Nitrite POSITIVE (*)    Bacteria, UA MANY (*)    All other components within normal limits  BASIC METABOLIC PANEL - Abnormal; Notable for the following components:   Glucose, Bld 172 (*)    Creatinine, Ser 1.02 (*)    GFR calc non Af Amer 53 (*)    All other components within normal limits  URINE CULTURE  CBC WITH DIFFERENTIAL/PLATELET  TSH  VITAMIN B12  I-STAT TROPONIN, ED    EKG EKG Interpretation  Date/Time:  Monday June 17 2018 11:42:32 EST Ventricular Rate:  73 PR Interval:    QRS Duration: 139 QT Interval:  415 QTC Calculation: 458 R Axis:   -53 Text Interpretation:  Sinus rhythm RBBB and LAFB no sig change from previous Confirmed by Arby Barrette [phone]) on 06/17/2018 4:26:14 PM   Radiology No results found.  Procedures Procedures (including critical care  time)  Medications Ordered in ED Medications  amitriptyline (ELAVIL) tablet 10 mg (10 mg Oral Given 06/17/18 1614)  lactated ringers infusion (has no administration in time range)  aspirin EC tablet 81 mg (has no administration in time range)  atenolol (TENORMIN) tablet 12.5 mg (has no administration in time range)  loperamide (IMODIUM) capsule 2 mg (has no administration in time range)  pantoprazole (PROTONIX) EC tablet 40 mg (has no administration in time range)  traMADol (ULTRAM) tablet 100 mg (has no administration in time range)  amitriptyline (ELAVIL) tablet 10-30 mg (has no administration in time range)  sodium chloride 0.9 % bolus 1,000 mL (0 mLs Intravenous Stopped 06/17/18 1753)  oxyCODONE (Oxy IR/ROXICODONE) immediate release tablet 10 mg (10 mg Oral Given 06/17/18 1340)  cephALEXin (KEFLEX) capsule 500 mg (500 mg Oral Given 06/17/18 1613)  sodium chloride 0.9 % bolus 1,000 mL (1,000 mLs Intravenous New Bag/Given 06/17/18 1753)     Initial Impression / Assessment and Plan / ED Course  I have reviewed the triage vital signs and the nursing notes.  Pertinent labs & imaging results that were available during my care of the patient were reviewed by me and considered in my medical decision making (see chart for details).  Clinical Course as of Jun 17 1957  [facility] Jun 17, 2018  1402 Mildly elevated creatinine. Will encourage fluids.   Creatinine(!): 1.02 [AH]  1546 UA reveals nitrites, bacteria,and hbg consistent with a UTI. Will order urine culture.   Nitrite(!): POSITIVE [AH]  1549 WBCs are within normal limits at 7.5  WBC: 7.5 [AH]    Clinical Course User Index [AH] Carlyle BasquesHernandez, Korra Christine P, PA-C   Pt has been diagnosed with a UTI. Pt is afebrile, no CVA tenderness, normotensive, and denies N/V. Provided IVF and antibiotics while in the ER. Suspect generalized weakness is likely due to UTI. Patient has been able to ambulate with assistance. Discussed ordering an MRI due to back pain and  urinary incontinence/retention. Patient refused MRI at this time. Consulted hospitalist and hospitalist does not believe patient requires admission at this time. Hospitalist recommended additional fluids and discharge. Discussed admission with patient and patient refuses admission at this time. Will discharge patient to home with strict return precautions. Will prescribe antibiotics for urinary tract infection. Findings and plan of care discussed with supervising physician Dr. Donnald GarrePfeiffer who personally evaluated and examined this patient.   Final Clinical Impressions(s) / ED Diagnoses   Final diagnoses:  Acute cystitis with hematuria  Chronic bilateral low back pain without sciatica  Weakness generalized    ED Discharge Orders         Ordered    cephALEXin (KEFLEX) 250 MG capsule  4 times daily     06/17/18 1955           Leretha DykesHernandez, Rawley Harju P, [place]PA-C 06/17/18 1956    Leretha DykesHernandez, Amaru Burroughs P, [place]PA-C 06/17/18 1959    Arby BarrettePfeiffer, Marcy, MD 06/23/18 [phone]

## 2018-06-17 NOTE — ED Triage Notes (Signed)
To ED via [facility] from home, with c/o increasing weakness/not feeling well- fever-ish at home, strong urine odor.-- pt is alert/oriented on arrival

## 2018-06-17 NOTE — Discharge Instructions (Addendum)
You have been seen today for weakness and a urinary tract infection. Please read and follow all provided instructions.   1. Medications: keflex (antibiotic), usual home medications 2. Treatment: rest, drink plenty of fluids 3. Follow Up: Please follow up with your primary doctor in 2 days for discussion of your diagnoses and further evaluation after today's visit; if you do not have a primary care doctor use the resource guide provided to find one; Please return to the ER for any new or worsening symptoms. Please obtain all of your results from medical records or have your doctors office obtain the results - share them with your doctor - you should be seen at your doctors office. Call today to arrange your follow up.   Take medications as prescribed. Please review all of the medicines and only take them if you do not have an allergy to them. Return to the emergency room for worsening condition or new concerning symptoms. Follow up with your regular doctor. If you don't have a regular doctor use one of the numbers below to establish a primary care doctor.  Please be aware that if you are taking birth control pills, taking other prescriptions, ESPECIALLY ANTIBIOTICS may make the birth control ineffective - if this is the case, either do not engage in sexual activity or use alternative methods of birth control such as condoms until you have finished the medicine and your family doctor says it is OK to restart them. If you are on a blood thinner such as COUMADIN, be aware that any other medicine that you take may cause the coumadin to either work too much, or not enough - you should have your coumadin level rechecked in next 7 days if this is the case.  ?  It is also a possibility that you have an allergic reaction to any of the medicines that you have been prescribed - Everybody reacts differently to medications and while MOST people have no trouble with most medicines, you may have a reaction such as nausea,  vomiting, rash, swelling, shortness of breath. If this is the case, please stop taking the medicine immediately and contact your physician.  ?  You should return to the ER if you develop severe or worsening symptoms.   Emergency Department Resource Guide 1) Find a Doctor and Pay Out of Pocket Although you won't have to find out who is covered by your insurance plan, it is a good idea to ask around and get recommendations. You will then need to call the office and see if the doctor you have chosen will accept you as a new patient and what types of options they offer for patients who are self-pay. Some doctors offer discounts or will set up payment plans for their patients who do not have insurance, but you will need to ask so you aren't surprised when you get to your appointment.  2) Contact Your Local Health Department Not all health departments have doctors that can see patients for sick visits, but many do, so it is worth a call to see if yours does. If you don't know where your local health department is, you can check in your phone book. The CDC also has a tool to help you locate your state's health department, and many state websites also have listings of all of their local health departments.  3) Find a Walk-in Clinic If your illness is not likely to be very severe or complicated, you may want to try a walk in clinic.  These are popping up all over the country in pharmacies, drugstores, and shopping centers. They're usually staffed by nurse practitioners or physician assistants that have been trained to treat common illnesses and complaints. They're usually fairly quick and inexpensive. However, if you have serious medical issues or chronic medical problems, these are probably not your best option.  No Primary Care Doctor: Call Health Connect at  [phone] - they can help you locate a primary care doctor that  accepts your insurance, provides certain services, etc. Physician Referral [phone]  Emergency Department Resource Guide 1) Find a Doctor and Pay Out of Pocket Although you won't have to find out who is covered by your insurance plan, it is a good idea to ask around and get recommendations. You will then need to call the office and see if the doctor you have chosen will accept you as a new patient and what types of options they offer for patients who are self-pay. Some doctors offer discounts or will set up payment plans for their patients who do not have insurance, but you will need to ask so you aren't surprised when you get to your appointment.  2) Contact Your Local Health Department Not all health departments have doctors that can see patients for sick visits, but many do, so it is worth a call to see if yours does. If you don't know where your local health department is, you can check in your phone book. The CDC also has a tool to help you locate your state's health department, and many state websites also have listings of all of their local health departments.  3) Find a [facility] If your illness is not likely to be very severe or complicated, you may want to try a walk in clinic. These are popping up all over the country in pharmacies, drugstores, and shopping centers. They're usually staffed by nurse practitioners or physician assistants that have been trained to treat common illnesses and complaints. They're usually fairly quick and inexpensive. However, if you have serious medical issues or chronic medical problems, these are probably not your best option.  No Primary Care Doctor: Call Health Connect at  [phone] - they can help you locate a primary care doctor that  accepts your insurance, provides certain services, etc. Physician Referral Service- [phone]  Chronic Pain Problems: Organization         Address  Phone   Notes  [facility]  [phone] Patients need to be referred by their primary care doctor.    Medication Assistance: Organization         Address  Phone   Notes  [facility] Medication [facility] [address] [place], [place] [address] [phone] --Must be a resident of [facility] -- Must have NO insurance coverage whatsoever (no Medicaid/ Medicare, etc.) -- The pt. MUST have a primary care doctor that directs their care regularly and follows them in the community   [facility]  [phone]   [employer]  [phone]    Agencies that provide inexpensive medical care: Organization         Address  Phone   Notes  [place]  [phone]   [facility]    [phone]   [facility] [place], [place] [address] [phone]   [place] [address], [place] [phone]   [facility]    (  [phone]   [facility]    [phone]   [facility]  [address], [place] Phone:  [phone], Fax:  [phone] Hours of Operation:  9 am - 6 pm, M-F.  Also accepts Medicaid/Medicare and self-pay.  [facility] for [place] [address], [address], [place] Phone: [phone], Fax: [phone]. Hours of Operation:  8:30 am - 5:30 pm, M-F.  Also accepts Medicaid and self-pay.  [facility] [place] [address], [place] Phone: [phone]   [place], [place], [place] [phone], Ext. [phone] Mondays & Thursdays: 7-9 AM.  First 15 patients are seen on a first come, first serve basis.    [place] Providers:  Organization         Address  Phone   Notes  [facility] [address], [place] [phone] Also accepts self-pay patients.  [facility] [address], [place]  [phone]   [place], [address], [place] [phone]   [facility] [address] [place] [phone]   Lucianne Lei [address], [place]   [phone] Only accepts [place] [facility] [place] patients after they have their name applied to their card.   Self-Pay (no insurance) in [facility]:  Organization         Address  Phone   Notes  Sickle Cell Patients, [facility] [place] [phone]   [facility] [place] [phone]   [facility] [place]  [place], [place], [place] [phone]   [facility]/Dr. Osei-Bonsu  [address], [place] or [address], [place] [phone] Phone number for both [place] and [place] locations is the same.  [facility] [address], [place] [phone]   [facility] [address] [place] or [address] [phone]   [facility] [address] [place] [phone], phone; [phone], fax Sees patients 1st and 3rd Saturday of every month.  Must not qualify for public or private insurance (i.e. Medicaid, Medicare, [place] Health Choice, Veterans' Benefits)  Household income should be no more than 200% of the poverty level The clinic cannot treat you if you are pregnant or think you are pregnant  Sexually transmitted diseases are not treated at the clinic.

## 2018-06-17 NOTE — ED Notes (Signed)
Patient verbalizes understanding of discharge instructions. Opportunity for questioning and answers were provided. Armband removed by staff, pt discharged from ED via wheelchair to home.  

## 2018-06-17 NOTE — Consult Note (Signed)
Consult Note                                                      Maria ManuelBarbara D Brierley  ZOX:[id number]  DOB: 1940-06-02  DOA: 06/17/2018  PCP: Joaquim Namuncan, Graham S, MD   Outpatient Specialists: Dr. Danielle DessElsner   Requesting physician/NP: Leighton ParodyAnna Hernandez NP  Reason for consultation: Weakness   History of Present llness   Maria [name] is an 78 y.o. female  With H/O L-spine radiculopathy, chronic low back pain, status post L spine discectomy by Dr. Danielle DessElsner in September 2019, chronic narcotic use, essential hypertension, DM type 2 insulin-dependent, anxiety, depression who comes to the hospital with primary complaints of generalized weakness and Demond-colored urine.  She denies any fevers, no new focal weakness, since her back surgery she has had mild left lower extremity weakness but that is essentially unchanged.  She came to the ER where her work-up was unremarkable, initially the ER staff thought that patient was unable to stand or walk and I was called to admit the patient for further work-up.  However when I came to the room patient was able to stand up walk to the bathroom and came back.  She actually desires to go home, she had earlier refused MRI of her low back as well.  At this time she has received some IV fluids, she feels good, her lab work is unremarkable including unremarkable UA and EKG, I discussed her case with Dr. Danielle DessElsner as well who had seen the patient 1 week ago in the office.  Patient currently wants to go home, only subjective complaint is generalized weakness and mild darkness in the color of her urine which is improved after IV fluids.    Review Of Systems    A full 10 point Review of Systems was done, except as stated above, all other Review of Systems were  negative.    Social History   Social History   Tobacco Use  . Smoking status: Never Smoker  . Smokeless tobacco: Never Used  Substance Use Topics  . Alcohol use: No    Alcohol/week: 0.0 standard drinks      Family History   Family History  Problem Relation Age of Onset  . Hypertension Mother   . Heart disease Mother   . Diabetes Sister   . Myasthenia gravis Sister   . Diabetes Brother        1/2 juvenile; foot ulcer; periph. neuropathy  . Heart disease Maternal Grandfather        MI  . Hypertension Maternal  Grandfather   . Depression Paternal Aunt   . Hypertension Maternal Grandmother   . Diabetes Maternal Grandmother   . Stroke Maternal Grandmother   . Hypertension Paternal Grandmother   . Diabetes Paternal Grandmother   . Hypertension Paternal Grandfather   . Diabetes Paternal Grandfather   . Breast cancer Maternal Aunt   . Colon cancer Neg Hx       Medications   Prior to Admission medications   Medication Sig Start Date End Date Taking? Authorizing Provider  Alirocumab (PRALUENT) 75 MG/ML SOPN Inject 1 pen into the skin every 14 (fourteen) days. 01/21/18   Wendall Stade, MD  amitriptyline (ELAVIL) 10 MG tablet Take 1-3 tablets (10-30 mg total) by mouth at bedtime. Patient not taking: Reported on 06/13/2018 05/16/18   Joaquim Nam, MD  aspirin EC 81 MG tablet Take 81 mg by mouth once.     [provider]  atenolol (TENORMIN) 25 MG tablet Take 0.5 tablets (12.5 mg total) by mouth daily. 04/08/18   Wendall Stade, MD  cyanocobalamin (,VITAMIN B-12,) 1000 MCG/ML injection 1000 mcg injected IM weekly x4 doses then monthly thereafter. 06/16/18   Joaquim Nam, MD  glucose blood (ONE TOUCH ULTRA TEST) test strip USE AS INSTRUCTED TO TEST BLOOD SUGAR THREE TIMES DAILY. 10/18/17   Joaquim Nam, MD  glyBURIDE (DIABETA) 5 MG tablet TAKE 1 TO 2 TABLETS BY MOUTH DAILY WITH BREAKFAST. 01/23/18   Joaquim Nam, MD  insulin degludec (TRESIBA FLEXTOUCH) 100  UNIT/ML SOPN FlexTouch Pen Inject 0.12 mLs (12 Units total) into the skin daily at 10 pm. 04/23/18   Joaquim Nam, MD  Insulin Pen Needle (PEN NEEDLES) 32G X 4 MM MISC 1 each by Does not apply route daily. 05/18/17   Carlus Pavlov, MD  loperamide (IMODIUM) 2 MG capsule Take 2 mg by mouth 4 (four) times daily as needed for diarrhea or loose stools.     [provider]  losartan (COZAAR) 25 MG tablet Take 1 tablet (25 mg total) by mouth daily. 02/13/18   Wendall Stade, MD  nitroGLYCERIN (NITROSTAT) 0.4 MG SL tablet PLACE 1 TABLET (0.4 MG TOTAL) UNDER THE TONGUE EVERY 5 (FIVE) MINUTES AS NEEDED FOR CHEST PAIN. 07/26/17   Rosalio Macadamia, NP  omeprazole (PRILOSEC) 20 MG capsule Take 1 capsule (20 mg total) by mouth 2 (two) times daily as needed (for acid reflux/indigestion.). 04/23/18   Joaquim Nam, MD  oxyCODONE (OXY IR/ROXICODONE) 5 MG immediate release tablet Take 1-2 tablets (5-10 mg total) by mouth every 3 (three) hours as needed for severe pain (for pain.). Patient taking differently: Take 10 mg by mouth every 3 (three) hours as needed for severe pain (for pain.).  02/07/18   Barnett Abu, MD  traMADol (ULTRAM) 50 MG tablet TAKE 1 TABLET BY MOUTH EVERY 6 HOURS AS NEEDED FOR MODERATE PAIN. MAY CAUSE SEDATION. Patient not taking: Reported on 06/13/2018 05/15/18   Joaquim Nam, MD    Anti-infectives (From admission, onward)   Start     Dose/Rate Route Frequency Ordered Stop   06/17/18 1600  cephALEXin (KEFLEX) capsule 500 mg     500 mg Oral  Once 06/17/18 1548 06/17/18 1613      Scheduled Meds: . amitriptyline  10 mg Oral QHS  . amitriptyline  10-30 mg Oral QHS  . aspirin EC  81 mg Oral Once  . atenolol  12.5 mg Oral Daily  . pantoprazole  40 mg Oral Daily  Continuous Infusions: . lactated ringers    . sodium chloride     PRN Meds:.loperamide, traMADol  Allergies  Allergen Reactions  . Crestor [Rosuvastatin] Other (See Comments)    MYALGIAS  .  Ezetimibe-Simvastatin Other (See Comments)    MYALGIAS  . Pravastatin Other (See Comments)    Myalgias  . Lyrica [Pregabalin]     swelling  . Nsaids     UNSPECIFIED REACTION   . Acetaminophen Anxiety and Other (See Comments)    REACTION: jittery with plain tylenol, but tolerated tylenol/benadryl combination  . Carvedilol Other (See Comments)    nightmares  . Gabapentin Diarrhea  . Glipizide Other (See Comments)    GI intolerance.    . Ibuprofen Other (See Comments)    REACTION: GI upset at high doses  . Insulin Glargine Rash    Rash  . Levemir [Insulin Detemir] Rash  . Macrobid [Nitrofurantoin Macrocrystal] Nausea And Vomiting and Other (See Comments)    ACHES  . Metformin Nausea And Vomiting    REACTION: GI upset  . Metoprolol Tartrate Other (See Comments)    REACTION: nightmares  . Naproxen Nausea And Vomiting    REACTION: GI upset    Objective   Physical Exam  Vitals  Blood pressure (!) 148/127, pulse 72, temperature 97.7 F (36.5 C), temperature source Oral, resp. rate 18, height 5\' 5"  (1.651 m), weight 66.7 kg, SpO2 99 %.   1. General elderly white female walking in the hallway without any distress,  2. Normal affect and insight, Not Suicidal or Homicidal, Awake Alert, Oriented X 3.  3. No F.N deficits, ALL C.Nerves Intact, mild relative L leg weakness ( old per pt), Sensation intact all 4 extremities, Plantars down going.  4. Ears and Eyes appear Normal, Conjunctivae clear, PERRLA. Moist Oral Mucosa.  5. Supple Neck, No JVD, No cervical lymphadenopathy appriciated, No Carotid Bruits.  6. Symmetrical Chest wall movement, Good air movement bilaterally, CTAB.  7. RRR, No Gallops, Rubs or Murmurs, No Parasternal Heave.  8. Positive Bowel Sounds, Abdomen Soft, No tenderness, No organomegaly appriciated,No rebound -guarding or rigidity.  9.  No Cyanosis, Normal Skin Turgor, No Skin Rash or Bruise.  10. Good muscle tone,  joints appear normal , no effusions,  Normal ROM.  11. No Palpable Lymph Nodes in Neck or Axillae     Data   CBC Recent Labs  Lab 06/17/18 1137  WBC 7.5  HGB 13.5  HCT 41.9  PLT 355  MCV 86.4  MCH 27.8  MCHC 32.2  RDW 13.4  LYMPHSABS 2.2  MONOABS 0.5  EOSABS 0.2  BASOSABS 0.0   ------------------------------------------------------------------------------------------------------------------  Chemistries  Recent Labs  Lab 06/17/18 1137  NA 141  K 4.4  CL 107  CO2 22  GLUCOSE 172*  BUN 16  CREATININE 1.02*  CALCIUM 9.6   ------------------------------------------------------------------------------------------------------------------ estimated creatinine clearance is 40.9 mL/min (A) (by C-G formula based on SCr of 1.02 mg/dL (H)). ------------------------------------------------------------------------------------------------------------------ No results for input(s): TSH, T4TOTAL, T3FREE, THYROIDAB in the last 72 hours.  Invalid input(s): FREET3   Coagulation profile No results for input(s): INR, PROTIME in the last 168 hours. ------------------------------------------------------------------------------------------------------------------- No results for input(s): DDIMER in the last 72 hours. -------------------------------------------------------------------------------------------------------------------  Cardiac Enzymes No results for input(s): CKMB, TROPONINI, MYOGLOBIN in the last 168 hours.  Invalid input(s): CK ------------------------------------------------------------------------------------------------------------------ Invalid input(s): POCBNP   ---------------------------------------------------------------------------------------------------------------  Urinalysis    Component Value Date/Time   COLORURINE AMBER (A) 06/17/2018 1522   APPEARANCEUR HAZY (A) 06/17/2018 1522   LABSPEC  1.019 06/17/2018 1522   PHURINE 5.0 06/17/2018 1522   GLUCOSEU NEGATIVE 06/17/2018 1522    HGBUR SMALL (A) 06/17/2018 1522   BILIRUBINUR NEGATIVE 06/17/2018 1522   BILIRUBINUR neg 07/20/2016 1027   KETONESUR 5 (A) 06/17/2018 1522   PROTEINUR NEGATIVE 06/17/2018 1522   UROBILINOGEN negative 07/20/2016 1027   NITRITE POSITIVE (A) 06/17/2018 1522   LEUKOCYTESUR NEGATIVE 06/17/2018 1522     Imaging    No results found.  My personal review of EKG: Rhythm NSR,   no Acute ST changes  Assessment & Paln    1.  Acute on chronic low back pain.  Mild generalized weakness and mild dehydration causing Pedraza urine.  Patient seen, no new focal deficits except mild chronic left lower extremity weakness.  She ambulated without much assistance in the ER.  She does not want to be admitted and wants to go home, has been adequately hydrated with 1 L of fluid in the ER.  I have encouraged her to use more tramadol and less of short acting opioid which could be causing some generalized weakness.  Have also requested her to follow-up with Dr. Danielle Dess within a week.  She was also offered MRI in the hospital which she refused.  She has no fever or leukocytosis, her WBC count in the UA is less than 5 hence I do not think she has an active UTI.  Being discharged home by the ER physician, requested her to follow with Dr. Everardo All within a week and her PCP within a week.  Minimize OxyIR use and to take more Tylenol and tramadol which she already has at home for back pain.  Asked her to keep herself well-hydrated.  Follow with PCP within a week as well.         Family Communication: Plan discussed with patient and family friend    Thank you for the consult, we will follow the patient with you in the Hospital.   Susa Raring M.D on 06/17/2018 at 4:49 PM

## 2018-06-18 ENCOUNTER — Telehealth: Payer: Self-pay

## 2018-06-18 MED ORDER — TRAMADOL HCL 50 MG PO TABS: ORAL_TABLET | ORAL | Status: DC

## 2018-06-18 NOTE — Telephone Encounter (Signed)
Unable to reach patient by phone, VM full.  Called daughter [name]) and gave the information to her who will let her Mom know.

## 2018-06-18 NOTE — Telephone Encounter (Signed)
Please see message below and also, does patient need ER follow up appointment?

## 2018-06-18 NOTE — Telephone Encounter (Signed)
Pt's daughter called and left a message on the triage line stating she went to the ER yesterday and diagnosed with a UTI. She has been having dizziness. The ER doctor suggested it was coming from the oxycodone and that she should be taking the tramadol more. Said she tried the tramadol but it does not help taking 1 tablet every 6 hours. Asking if that could be increased.

## 2018-06-18 NOTE — Telephone Encounter (Signed)
She could try taking 100mg  TID (that would be 6 pills a day instead of 4).  Sedation caution.  Please offer ER f/u.  Thanks.

## 2018-06-19 ENCOUNTER — Ambulatory Visit: Payer: Medicare Other

## 2018-06-19 ENCOUNTER — Telehealth: Payer: Self-pay | Admitting: Family Medicine

## 2018-06-19 NOTE — Telephone Encounter (Signed)
Please give the order.  Thanks.   

## 2018-06-19 NOTE — Telephone Encounter (Signed)
Best number [phone] [facility] @ [facility]  Called needing to get verbals  Hold PT for this week restart on 06/23/2018  Need for verbal for nursing evaluation for medication management

## 2018-06-19 NOTE — Telephone Encounter (Signed)
Dedria advised

## 2018-06-20 ENCOUNTER — Telehealth: Payer: Self-pay | Admitting: Family Medicine

## 2018-06-20 DIAGNOSIS — R29898 Other symptoms and signs involving the musculoskeletal system: Secondary | ICD-10-CM

## 2018-06-20 LAB — URINE CULTURE: Culture: >=100000 — AB

## 2018-06-20 NOTE — Telephone Encounter (Signed)
Pt's daughter called office to let Dr.Duncan know her A1C was good, but the pt is not eating a lot. She wants to know if she can come off the medication for her diabetes. Pt's daughter is requesting a call back. Best # [phone]

## 2018-06-20 NOTE — Telephone Encounter (Signed)
If her sugar is controlled and if she isn't having lows, then I wouldn't change her meds yet.  If she has any lows in the meantime, then I would cut the back on glyburide- her current dose by 1 pill per day.  Also, I thought she was going to f/u with endo when possible.  Endo may have input re: her meds.  Thanks.

## 2018-06-20 NOTE — Telephone Encounter (Signed)
Maria [name] from [facility] called requesting verbal orders for skilled nursing. It is for medication information and assessments. She can be reached at [phone]

## 2018-06-20 NOTE — Telephone Encounter (Signed)
Daughter says she will do that once her Mom gets through some of the UTI issues.

## 2018-06-20 NOTE — Telephone Encounter (Signed)
Crystal Leadman with [facility] advised.  Crystal says the only other thing that she thinks the patient needs is a bedside commode, not for beside the bed but to fit over the commode for more support.  Order will be placed with [facility].

## 2018-06-20 NOTE — Telephone Encounter (Signed)
Daughter advised.  Daughter is not sure if patient has seen Endocrinology or not and says that right now, her Mom won't know.  Patient is very weak and confused.

## 2018-06-20 NOTE — Telephone Encounter (Signed)
She was not confused at the last visit here.  If that is a new issue then please offer recheck with routine emergent cautions (SOB, etc) in the meantime.   Thanks.

## 2018-06-20 NOTE — Telephone Encounter (Signed)
Please give the order.  Thanks.   

## 2018-06-21 ENCOUNTER — Telehealth: Payer: Self-pay | Admitting: *Deleted

## 2018-06-21 NOTE — Progress Notes (Signed)
ED Antimicrobial Stewardship Positive Culture Follow Up   Maria [name] is an 78 y.o. female who presented to [facility] on 06/17/2018 with a chief complaint of  Chief Complaint  Patient presents with  . flu like sx  . possible uti   Presented with generalized weakness and Hicks colored urine - UA 0-5 WBC, positive nitrite, and many bacteria.  Recent Results (from the past 720 hour(s))  Urine culture     Status: Abnormal   Collection Time: 06/17/18  3:28 PM  Result Value Ref Range Status   Specimen Description URINE, RANDOM  Final   Special Requests NONE  Final   Culture (A)  Final    >=100,000 COLONIES/mL KLEBSIELLA OXYTOCA >=100,000 COLONIES/mL AEROCOCCUS URINAE ORGANISM 2 Standardized susceptibility testing for this organism is not available. Performed at [facility], [address], [place], [place] [address]    Report Status 06/20/2018 FINAL  Final   Organism ID, Bacteria KLEBSIELLA OXYTOCA (A)  Final      Susceptibility   Klebsiella oxytoca - MIC*    AMPICILLIN >=32 RESISTANT Resistant     CEFAZOLIN >=64 RESISTANT Resistant     CEFTRIAXONE <=1 SENSITIVE Sensitive     CIPROFLOXACIN <=0.25 SENSITIVE Sensitive     GENTAMICIN <=1 SENSITIVE Sensitive     IMIPENEM <=0.25 SENSITIVE Sensitive     NITROFURANTOIN <=16 SENSITIVE Sensitive     TRIMETH/SULFA <=20 SENSITIVE Sensitive     AMPICILLIN/SULBACTAM 8 SENSITIVE Sensitive     PIP/TAZO <=4 SENSITIVE Sensitive     Extended ESBL NEGATIVE Sensitive     * >=100,000 COLONIES/mL KLEBSIELLA OXYTOCA    [x]  Treated with Cephalexin, organism resistant to prescribed antimicrobial []  Patient discharged originally without antimicrobial agent and treatment is now indicated  New antibiotic prescription: Cefdinir 300 mg twice daily for 5 days  ED Provider: Kennon Portela Norely Schlick 06/21/2018, 11:31 AM Clinical Pharmacist Monday - Friday phone -  [phone] Saturday - Sunday phone - [phone]

## 2018-06-21 NOTE — Telephone Encounter (Signed)
Post ED Visit - Positive Culture Follow-up  Culture report reviewed by antimicrobial stewardship pharmacist:  []  Enzo BiNathan Batchelder, Pharm.D. []  Celedonio MiyamotoJeremy Frens, Pharm.D., BCPS AQ-ID []  Garvin FilaMike Maccia, Pharm.D., BCPS []  Georgina PillionElizabeth Martin, [address]Ph[address] BCPS []  BloomingtonMinh Pham, [address]Pharm[address]D., BCPS, AAHIVP []  Estella HuskMichelle Turner, Pharm.D., BCPS, AAHIVP []  Lysle Pearlachel Rumbarger, PharmD, BCPS []  Phillips Climeshuy Dang, PharmD, BCPS []  Agapito GamesAlison Masters, PharmD, BCPS []  Verlan FriendsErin Deja, PharmD  Positive urine culture, reviewed by Alveria ApleySophia Caccavale, PA-C Spoke with spouse of patient who states patient is improving and has no urinary symptoms at this time.  No further patient follow-up is required at this time.  Virl AxeRobertson, Rebecah Dangerfield [facility] 06/21/2018, 11:48 AM

## 2018-06-21 NOTE — Addendum Note (Signed)
Addended by: Annamarie Major on: 06/21/2018 10:10 AM   Modules accepted: Orders

## 2018-06-24 ENCOUNTER — Telehealth: Payer: Self-pay | Admitting: Family Medicine

## 2018-06-24 NOTE — Telephone Encounter (Signed)
Deidre from [facility] called with an update on status of patient. She is still complaining of lower back pain. She recently started antibiotic for UTI and she is not getting any relief. She's requesting a call from a nurse to discuss alternatives. She can be reached at [phone]

## 2018-06-24 NOTE — Telephone Encounter (Signed)
Left detailed message on voicemail of Maria [name] but then phoned patient.  Patient's daughter [name]) answered and scheduled an appointment for Thursday, February 13 after she finishes the antibiotic and if much better, will call to cancel.

## 2018-06-27 ENCOUNTER — Ambulatory Visit: Payer: Medicare Other | Admitting: Family Medicine

## 2018-06-27 ENCOUNTER — Telehealth: Payer: Self-pay | Admitting: Family Medicine

## 2018-06-27 NOTE — Telephone Encounter (Signed)
Please give the order.  Thanks.   

## 2018-06-27 NOTE — Telephone Encounter (Signed)
[facility] called to request verbal for pt  Extend PT twice a week for 2 wks starting 2/16.  Deidre [phone]

## 2018-06-28 ENCOUNTER — Telehealth: Payer: Self-pay | Admitting: Family Medicine

## 2018-06-28 NOTE — Telephone Encounter (Signed)
Done

## 2018-06-28 NOTE — Telephone Encounter (Signed)
Best number [phone]  Deidre @ [facility]  Called to get verbal order to hold PT until  Friday 07/05/2018  Pt has had a decline  Waiting to get a stat MRI this weekend for possible back related issues. Her neurologist is ordering this   Please call Deidre today with order

## 2018-06-28 NOTE — Telephone Encounter (Signed)
See other phone note if needed. Please give the order to hold as requested.  Thanks.

## 2018-06-28 NOTE — Telephone Encounter (Signed)
Please give the order to hold as requested.  Thanks.

## 2018-06-28 NOTE — Telephone Encounter (Signed)
Deidre says that there has been a change in patient's condition and they are asking to put a hold on this order until next week.  Patient's neurologist has ordered a STAT MRI this weekend and they will resume after this test has been done and reported.

## 2018-07-02 ENCOUNTER — Telehealth: Payer: Self-pay | Admitting: Family Medicine

## 2018-07-02 NOTE — Telephone Encounter (Signed)
Patient's daughter,Sherry,called. She said her mother received a call from our office.  I couldn't find that anyone had called her.  She did have an appointment last Thursday to f/u UTI, but she cancelled the appointment due to her back pain and she had an appointment with Dr.Elsner. She's worried about coming in to the office because she's afraid she'll catch the flu.  [name] said if she needs to be checked they have a nurse from [facility] that will be coming this Thursday and she could check the urine.

## 2018-07-02 NOTE — Telephone Encounter (Signed)
Spoke with daughter and it is agreed that we don't know why she would have received a call from here but assured her that whoever it was would call back if needed.

## 2018-07-03 ENCOUNTER — Other Ambulatory Visit: Payer: Self-pay | Admitting: Neurological Surgery

## 2018-07-03 DIAGNOSIS — M4726 Other spondylosis with radiculopathy, lumbar region: Secondary | ICD-10-CM

## 2018-07-05 ENCOUNTER — Telehealth: Payer: Self-pay | Admitting: Family Medicine

## 2018-07-05 NOTE — Telephone Encounter (Signed)
Deidre,physical therapist with [facility],called asking for PT 2 x a week for 3 weeks beginning 07/07/18.

## 2018-07-07 NOTE — Telephone Encounter (Signed)
Please give the order.  Thanks.   

## 2018-07-08 NOTE — Telephone Encounter (Signed)
Left detailed message on voicemail of Deidre.

## 2018-07-08 NOTE — Telephone Encounter (Signed)
Deidre called back to get order.  Please call Deidre.

## 2018-07-16 ENCOUNTER — Ambulatory Visit
Admission: RE | Admit: 2018-07-16 | Discharge: 2018-07-16 | Disposition: A | Discharge status: Home / Self Care | Payer: Medicare Other | Source: Ambulatory Visit | Attending: Neurological Surgery | Admitting: Neurological Surgery

## 2018-07-16 DIAGNOSIS — M4726 Other spondylosis with radiculopathy, lumbar region: Secondary | ICD-10-CM

## 2018-07-16 MED ORDER — GADOBENATE DIMEGLUMINE 529 MG/ML IV SOLN
14.0000 mL | Freq: Once | INTRAVENOUS | Status: AC | PRN
  Administered 2018-07-16: 14 mL via INTRAVENOUS

## 2018-07-18 ENCOUNTER — Ambulatory Visit: Payer: Medicare Other | Admitting: Family Medicine

## 2018-07-18 ENCOUNTER — Encounter: Payer: Self-pay | Admitting: Family Medicine

## 2018-07-18 VITALS — BP 94/54 | HR 67 | Temp 97.3°F | Ht 65.0 in | Wt 144.2 lb

## 2018-07-18 DIAGNOSIS — R300 Dysuria: Secondary | ICD-10-CM

## 2018-07-18 DIAGNOSIS — N281 Cyst of kidney, acquired: Secondary | ICD-10-CM

## 2018-07-18 DIAGNOSIS — I10 Essential (primary) hypertension: Secondary | ICD-10-CM | POA: Diagnosis not present

## 2018-07-18 DIAGNOSIS — E538 Deficiency of other specified B group vitamins: Secondary | ICD-10-CM

## 2018-07-18 MED ORDER — CEPHALEXIN 250 MG PO CAPS: 250.0000 mg | ORAL_CAPSULE | Freq: Four times a day (QID) | ORAL | 0 refills | Status: AC

## 2018-07-18 MED ORDER — CEPHALEXIN 250 MG PO CAPS: 250.0000 mg | ORAL_CAPSULE | Freq: Four times a day (QID) | ORAL | 0 refills | Status: DC

## 2018-07-18 MED ORDER — CYANOCOBALAMIN 1000 MCG/ML IJ SOLN
1000.0000 ug | Freq: Once | INTRAMUSCULAR | Status: AC
  Administered 2018-07-18: 1000 ug via INTRAMUSCULAR

## 2018-07-18 MED ORDER — GLYBURIDE 5 MG PO TABS: ORAL_TABLET | ORAL | Status: DC

## 2018-07-18 NOTE — Progress Notes (Signed)
Lower BP noted today.  Had been 113/60s this AM on home check.  She can be episodically lightheaded on standing.    She still has episodic neuropathy pain.    She saw Dr. Danielle Dess yesterday.  Renal cysts noted on MRI, d/w pt and images reviewed with patient OV.  Yesterday with burning with urination.  No fevers.  No vomiting.  She has a change in odor with urination.    UTI hx noted.    She hasn't been on B12 tx yet.    PMH and SH reviewed  ROS: Per HPI unless specifically indicated in ROS section   Meds, vitals, and allergies reviewed.   GEN: nad, alert and oriented HEENT: mucous membranes moist NECK: supple w/o LA CV: rrr.  PULM: ctab, no inc wob ABD: soft, +bs EXT: no edema SKIN: well perfused.  No CVA pain, no suprapubic pain.

## 2018-07-18 NOTE — Patient Instructions (Addendum)
We make arrangements for referrals, extra imaging, and other appointments based on the urgency of the situation. Referrals are handled based on the clinical situation, not in the order that they are placed. If you do not see one of our referral coordinators on the way out of the clinic today, then you should expect a call in the next 1 to 2 weeks. We work diligently to process all referrals as quickly as possible.    I would start B12 injections weekly for 4 weeks then monthly thereafter with recheck B12 level in 3 months.  Schedule the injection for next week on the way out.   Stop losartan for now and update me about your BP next week.   Start keflex and drink plenty of water.  Get a urine sample collected when possible, hopefully prior to starting keflex.  Take care.  Glad to see you.

## 2018-07-21 NOTE — Assessment & Plan Note (Signed)
I would start B12 injections weekly for 4 weeks then monthly thereafter with recheck B12 level in 3 months.  She can schedule the injection for next week on the way out.

## 2018-07-21 NOTE — Assessment & Plan Note (Signed)
Potentially overtreated Stop losartan for now and update me about BP next week.

## 2018-07-21 NOTE — Assessment & Plan Note (Signed)
Discussed with patient about options.  Start keflex and drink plenty of water.  She can get a urine sample collected when possible, hopefully prior to starting keflex.  She could not produce a sample at the office visit.

## 2018-07-21 NOTE — Assessment & Plan Note (Addendum)
Images discussed with patient, reviewed with patient at office visit.  Refer to urology.  She agrees.  >25 minutes spent in face to face time with patient, >50% spent in counselling or coordination of care.

## 2018-07-23 ENCOUNTER — Telehealth: Payer: Self-pay | Admitting: Family Medicine

## 2018-07-23 NOTE — Telephone Encounter (Signed)
Best number [phone] Maria [name] @ [facility] called to get verbal order for re certification for  PT

## 2018-07-24 NOTE — Telephone Encounter (Signed)
Diedre called back to check status. She is requesting a cb today.

## 2018-07-24 NOTE — Telephone Encounter (Signed)
Verbal order given to Deidre as instructed by telephone and verbalized understanding.

## 2018-07-24 NOTE — Telephone Encounter (Signed)
Please give the order.  Thanks.   

## 2018-07-25 ENCOUNTER — Other Ambulatory Visit: Payer: Self-pay

## 2018-07-25 ENCOUNTER — Telehealth: Payer: Self-pay | Admitting: Family Medicine

## 2018-07-25 ENCOUNTER — Ambulatory Visit (INDEPENDENT_AMBULATORY_CARE_PROVIDER_SITE_OTHER): Payer: Medicare Other | Admitting: *Deleted

## 2018-07-25 DIAGNOSIS — E538 Deficiency of other specified B group vitamins: Secondary | ICD-10-CM | POA: Diagnosis not present

## 2018-07-25 MED ORDER — CYANOCOBALAMIN 1000 MCG/ML IJ SOLN
1000.0000 ug | Freq: Once | INTRAMUSCULAR | Status: AC
  Administered 2018-07-25: 1000 ug via INTRAMUSCULAR

## 2018-07-25 NOTE — Progress Notes (Signed)
Per orders of Dr. Duncan, injection of B12 inj. given by Sabiha Sura M. Patient tolerated injection well. 

## 2018-07-25 NOTE — Telephone Encounter (Signed)
Best number [phone]  Raynelle Fanning @ [facility] called to let you know pt is being discharged from nursing service today 3/12 Pt will have PT one more time on Friday 3/13  PT person  will call with discharge from agency  Raynelle Fanning stated pt is doing good

## 2018-07-26 NOTE — Telephone Encounter (Signed)
Diedre called back to get subsequent verbal order- PT 2x week for 3 wks, effective 3/15. Call [phone]- can leave a message

## 2018-07-26 NOTE — Telephone Encounter (Signed)
Left detailed message on voicemail.  

## 2018-07-26 NOTE — Telephone Encounter (Signed)
Please give the order.  Thanks.   

## 2018-07-26 NOTE — Telephone Encounter (Signed)
Noted. Thanks.

## 2018-08-01 ENCOUNTER — Ambulatory Visit: Payer: Medicare Other

## 2018-08-01 ENCOUNTER — Ambulatory Visit (INDEPENDENT_AMBULATORY_CARE_PROVIDER_SITE_OTHER): Payer: Medicare Other

## 2018-08-01 ENCOUNTER — Other Ambulatory Visit: Payer: Self-pay

## 2018-08-01 DIAGNOSIS — E538 Deficiency of other specified B group vitamins: Secondary | ICD-10-CM | POA: Diagnosis not present

## 2018-08-01 MED ORDER — CYANOCOBALAMIN 1000 MCG/ML IJ SOLN
1000.0000 ug | Freq: Once | INTRAMUSCULAR | Status: AC
  Administered 2018-08-01: 1000 ug via INTRAMUSCULAR

## 2018-08-01 NOTE — Progress Notes (Signed)
Per orders of Dr. Para March, injection of Vit B-12 given by Dorothy Spark. Patient tolerated injection well. Given in Lt deltoid

## 2018-08-06 ENCOUNTER — Other Ambulatory Visit: Payer: Self-pay | Admitting: Family Medicine

## 2018-08-08 ENCOUNTER — Ambulatory Visit: Payer: Medicare Other

## 2018-08-08 ENCOUNTER — Telehealth: Payer: Self-pay | Admitting: *Deleted

## 2018-08-08 NOTE — Telephone Encounter (Signed)
Due to current COVID 19 pandemic, our office is severely reducing in person visits in order to minimize the risk to our patients and healthcare providers. We recommend to convert your appointment to a video visit. She has a smart phone; I explained process to her. She stated she would rather reschedule so she can come in. We rescheduled for June. She verbalized understanding, appreciation.

## 2018-08-09 ENCOUNTER — Ambulatory Visit: Payer: Medicare Other | Admitting: Diagnostic Neuroimaging

## 2018-08-14 NOTE — Progress Notes (Signed)
Agree. Thanks

## 2018-08-15 ENCOUNTER — Telehealth: Payer: Self-pay | Admitting: Family Medicine

## 2018-08-15 NOTE — Telephone Encounter (Signed)
Maria [name] from [facility] called said pt was discharged today from physical therapy with goals met and she had a fall on Tuesday afternoon. She has bruises on left hand and it is also swollen and a bruise on her left eye.   [phone]

## 2018-08-16 NOTE — Telephone Encounter (Signed)
See below re: fall.  Please get update on patient.  Thanks.

## 2018-08-16 NOTE — Telephone Encounter (Signed)
Called pt and she said feels fine she was carrying bath mats she had just washed and slipped on one and fell. She said she did hit her eye so it is black and a little swollen but she said she feels fine. Pt isn't in any pain, she is doing her normal routine today but she does have a black eye. Pt's aware that being on ASA can make her bruise easy but she isn't in any pain at all. Pt said she didn't black out or anything, she has no vision changes, no headache, and no "groggy feeling". I advised pt if she develops any new sxs at all to let us know asap but for now pt said she is fine and she thanks Dr. Para March for checking on her

## 2018-08-16 NOTE — Telephone Encounter (Signed)
Noted. Thanks.

## 2018-08-19 DIAGNOSIS — E1151 Type 2 diabetes mellitus with diabetic peripheral angiopathy without gangrene: Secondary | ICD-10-CM

## 2018-08-19 DIAGNOSIS — Z79891 Long term (current) use of opiate analgesic: Secondary | ICD-10-CM

## 2018-08-19 DIAGNOSIS — M21372 Foot drop, left foot: Secondary | ICD-10-CM | POA: Diagnosis not present

## 2018-08-19 DIAGNOSIS — M5431 Sciatica, right side: Secondary | ICD-10-CM

## 2018-08-19 DIAGNOSIS — Z9181 History of falling: Secondary | ICD-10-CM

## 2018-08-19 DIAGNOSIS — K219 Gastro-esophageal reflux disease without esophagitis: Secondary | ICD-10-CM

## 2018-08-19 DIAGNOSIS — E1140 Type 2 diabetes mellitus with diabetic neuropathy, unspecified: Secondary | ICD-10-CM | POA: Diagnosis not present

## 2018-08-19 DIAGNOSIS — I2510 Atherosclerotic heart disease of native coronary artery without angina pectoris: Secondary | ICD-10-CM

## 2018-08-21 ENCOUNTER — Other Ambulatory Visit: Payer: Self-pay

## 2018-08-21 ENCOUNTER — Telehealth: Payer: Self-pay | Admitting: Family Medicine

## 2018-08-21 ENCOUNTER — Encounter: Payer: Self-pay | Admitting: Family Medicine

## 2018-08-21 ENCOUNTER — Ambulatory Visit (INDEPENDENT_AMBULATORY_CARE_PROVIDER_SITE_OTHER): Payer: Medicare Other | Admitting: Family Medicine

## 2018-08-21 ENCOUNTER — Other Ambulatory Visit (INDEPENDENT_AMBULATORY_CARE_PROVIDER_SITE_OTHER): Payer: Medicare Other

## 2018-08-21 DIAGNOSIS — N3000 Acute cystitis without hematuria: Secondary | ICD-10-CM | POA: Diagnosis not present

## 2018-08-21 DIAGNOSIS — R300 Dysuria: Secondary | ICD-10-CM

## 2018-08-21 LAB — POCT URINALYSIS DIPSTICK
Bilirubin, UA: NEGATIVE
Glucose, UA: NEGATIVE
Ketones, UA: NEGATIVE
Nitrite, UA: POSITIVE
Protein, UA: POSITIVE — AB
Spec Grav, UA: 1.025 (ref 1.010–1.025)
Urobilinogen, UA: 0.2 E.U./dL
pH, UA: 6 (ref 5.0–8.0)

## 2018-08-21 MED ORDER — CEPHALEXIN 500 MG PO CAPS: 500.0000 mg | ORAL_CAPSULE | Freq: Two times a day (BID) | ORAL | 0 refills | Status: AC

## 2018-08-21 NOTE — Telephone Encounter (Signed)
Best number [phone]  Made pt  Appointment 4/8

## 2018-08-21 NOTE — Progress Notes (Signed)
Virtual Visit via Telephone Note  I connected with Maria [name] on 08/21/18 at 11:00 AM EDT by telephone and verified that I am speaking with the correct person using two identifiers.   I discussed the limitations, risks, security and privacy concerns of performing an evaluation and management service by telephone and the availability of in person appointments. I also discussed with the patient that there may be a patient responsible charge related to this service. The patient expressed understanding and agreed to proceed.  Patient location: Home Provider Location: [place] Participants: Lynnda Child and [name]   History of Present Illness:  #Dysuria - burning w/ urination - started yesterday - treatment: increased fluids and more water - increased frequency no accidents - urgency - no bladder emptying empty - has them 2-3 times a year - keflex has worked for her in the past - denies fever, n/v, abdominal pain, flank pain    07/18/2018: Keflex for UTI - unable to provide sample 06/17/2018: ER - UTI - treated with keflex   Observations/Objective: Speaking in complete sentences, no fever (per report), no distress  UA with +protein and +LE no nitrites  Assessment and Plan: Problem List Items Addressed This Visit      Other   Dysuria   Relevant Orders   POCT urinalysis dipstick   Urine Culture    Other Visit Diagnoses    Acute cystitis without hematuria    -  Primary   Relevant Medications   cephALEXin (KEFLEX) 500 MG capsule     UA consistent with infection. Urine culture sent since she has had resistant infections in the past. Advised ER if symptoms worsen with long weekend coming up.   Follow Up Instructions:  Return if symptoms worsen or fail to improve.   I discussed the assessment and treatment plan with the patient. The patient was provided an opportunity to ask questions and all were answered. The patient agreed with the plan and  demonstrated an understanding of the instructions.   The patient was advised to call back or seek an in-person evaluation if the symptoms worsen or if the condition fails to improve as anticipated.  I provided 6 minutes of non-face-to-face time during this encounter.   Lynnda Child, MD

## 2018-08-21 NOTE — Addendum Note (Signed)
Addended by: Alvina Chou on: 08/21/2018 10:24 AM   Modules accepted: Orders

## 2018-08-23 LAB — URINE CULTURE
MICRO NUMBER:: 383571
SPECIMEN QUALITY:: ADEQUATE

## 2018-09-03 ENCOUNTER — Ambulatory Visit (INDEPENDENT_AMBULATORY_CARE_PROVIDER_SITE_OTHER): Payer: Medicare Other | Admitting: Family Medicine

## 2018-09-03 DIAGNOSIS — E782 Mixed hyperlipidemia: Secondary | ICD-10-CM

## 2018-09-03 DIAGNOSIS — M79643 Pain in unspecified hand: Secondary | ICD-10-CM

## 2018-09-03 DIAGNOSIS — E538 Deficiency of other specified B group vitamins: Secondary | ICD-10-CM | POA: Diagnosis not present

## 2018-09-03 DIAGNOSIS — G629 Polyneuropathy, unspecified: Secondary | ICD-10-CM | POA: Diagnosis not present

## 2018-09-03 DIAGNOSIS — E1159 Type 2 diabetes mellitus with other circulatory complications: Secondary | ICD-10-CM | POA: Diagnosis not present

## 2018-09-03 MED ORDER — CYANOCOBALAMIN 1000 MCG/ML IJ SOLN: INTRAMUSCULAR | Status: DC

## 2018-09-03 NOTE — Progress Notes (Signed)
Interactive audio and video telecommunications were attempted between this provider and patient, however failed, due to patient having technical difficulties OR patient did not have access to video capability.  We continued and completed visit with audio only.   Virtual Visit via Telephone Note  I connected with patient on 09/03/18 at 10:28 AM by telephone and verified that I am speaking with the correct person using two identifiers.  Location of patient: home.   Location of MD: [facility] Name of referring provider (if blank then none associated): Names per persons and role in encounter:  MD: Ferd Hibbs, Patient: name listed above.    I discussed the limitations, risks, security and privacy concerns of performing an evaluation and management service by telephone and the availability of in person appointments. I also discussed with the patient that there may be a patient responsible charge related to this service. The patient expressed understanding and agreed to proceed.  History of Present Illness:  Diabetes:  Using medications without difficulties:yes Hypoglycemic episodes:rare, cautions d/w pt.  She gets a snack prior to bed.  Hyperglycemic episodes:no Feet problems: neuropathy d/w pt.  See below.  Has tolerated 20 mg of amitriptyline at night. Blood Sugars averaging: 80-100s eye exam within last year: deferred for now.   Weight loss noted by patient after surgery, but her diet and weight loss levelled off recently.    She is taking tramadol at night for neuropathy pain, rarely during the day.  She can't tell much difference with B12 injection.  H/o B12 def noted. She did weekly x4 shots, then monthly shots.    She is off praluent since she had aches on the med, resolved off med.  She is going to f/u cards when possible.  She was going to ask cardiology about trying pravastatin again, since she maybe able to tolerate that.     Urinary sx are better.  D/w pt.    She had  some B 1st toe redness last night but not today.  She has baseline pain last night, she has more neuropathy pain at night.  She has occ swelling if she doesn't prop up her feet.    She had fallen a 3 weeks ago and still has some tenderness and puffiness at the L 5th MCP.  D/w pt options.   She is better but not back to baseline.  We talked about coming in for xrays, offered, but she wanted to defer for now.  She'll update [name] as needed.     Observations/Objective: nad Speaking in complete sentences.    Assessment and Plan: Neuropathy.  She can inc amitriptyline to 30mg  at night.  She will update me as needed.  Routine foot care discussed with patient.  B12 def, due for repeat labs.  Discussed.   DM2.   No change in meds at this point.  See notes on follow-up A1c.  HLD.  She is off praluent since she had aches on the med, resolved off med.  She is going to f/u cards when possible.  She was going to ask cardiology about trying pravastatin again, since she maybe able to tolerate that.     Hand pain. She had fallen a 3 weeks ago and still has some tenderness and puffiness at the L 5th MCP.  D/w pt options, I do not know if she has a fracture by just talking to her over the phone and she understood that..   She is better but not back to  baseline.  We talked about coming in for xrays, offered, but she wanted to defer for now.  She'll update us as needed.    Follow Up Instructions: She needs lab appointment on 09/10/2018 at 3:15.  Needs labs prior to B12 shot.    I discussed the assessment and treatment plan with the patient. The patient was provided an opportunity to ask questions and all were answered. The patient agreed with the plan and demonstrated an understanding of the instructions.   The patient was advised to call back or seek an in-person evaluation if the symptoms worsen or if the condition fails to improve as anticipated.  I provided 30 minutes of non-face-to-face time during this  encounter.  Crawford GivensGraham Britini Garcilazo, MD

## 2018-09-05 ENCOUNTER — Telehealth: Payer: Self-pay | Admitting: Pharmacist

## 2018-09-05 DIAGNOSIS — M79643 Pain in unspecified hand: Secondary | ICD-10-CM | POA: Insufficient documentation

## 2018-09-05 NOTE — Assessment & Plan Note (Signed)
Neuropathy.  She can inc amitriptyline to 30mg  at night.  She will update me as needed.  Routine foot care discussed with patient.

## 2018-09-05 NOTE — Telephone Encounter (Signed)
Called pt to discuss resuming pravastatin 20mg  HS since she developed myalgias on Praluent. LMOM for pt. Will plan to titrate pravastatin to max tolerated dose and can consider Nexletol if LDL remains above goal.

## 2018-09-05 NOTE — Assessment & Plan Note (Signed)
Hand pain. She had fallen a 3 weeks ago and still has some tenderness and puffiness at the L 5th MCP.  D/w pt options, I do not know if she has a fracture by just talking to her over the phone and she understood that..   She is better but not back to baseline.  We talked about coming in for xrays, offered, but she wanted to defer for now.  She'll update [name] as needed.

## 2018-09-05 NOTE — Assessment & Plan Note (Signed)
HLD.  She is off praluent since she had aches on the med, resolved off med.  She is going to f/u cards when possible.  She was going to ask cardiology about trying pravastatin again, since she maybe able to tolerate that.    I will update cards about this.

## 2018-09-05 NOTE — Assessment & Plan Note (Signed)
B12 def, due for repeat labs.  Discussed.

## 2018-09-05 NOTE — Assessment & Plan Note (Signed)
DM2.   No change in meds at this point.  See notes on follow-up A1c.

## 2018-09-05 NOTE — Telephone Encounter (Signed)
Wendall Stade, MD  Joaquim Nam, MD; Awilda Metro, [facility]        Ok to restart pravastatin and follow her lipids can refer to lipid clinic as well   Previous Messages    ----- Message -----  From: Joaquim Nam, MD  Sent: 09/05/2018 12:10 AM EDT  To: Wendall Stade, MD   She is off praluent since she had aches on the med, resolved off med. She may be able to start pravastatin again, since she was able to tolerate that in the past.  I did not restart pravastatin yet. I was going to defer to you since you had followed her lipids previously. If you want me to restart the pravastatin and follow her lipids then I can. Just let me know. Thanks.   Maria [name]

## 2018-09-06 MED ORDER — PRAVASTATIN SODIUM 20 MG PO TABS: 20.0000 mg | ORAL_TABLET | Freq: Every evening | ORAL | 11 refills | Status: DC

## 2018-09-06 NOTE — Telephone Encounter (Signed)
Spoke with pt who is willing to resume pravastatin 20mg  HS. Rx sent to pharmacy, will call pt in 1 month to assess tolerability.

## 2018-09-10 ENCOUNTER — Other Ambulatory Visit (INDEPENDENT_AMBULATORY_CARE_PROVIDER_SITE_OTHER): Payer: Medicare Other

## 2018-09-10 ENCOUNTER — Ambulatory Visit (INDEPENDENT_AMBULATORY_CARE_PROVIDER_SITE_OTHER): Payer: Medicare Other

## 2018-09-10 DIAGNOSIS — E538 Deficiency of other specified B group vitamins: Secondary | ICD-10-CM | POA: Diagnosis not present

## 2018-09-10 DIAGNOSIS — E1159 Type 2 diabetes mellitus with other circulatory complications: Secondary | ICD-10-CM | POA: Diagnosis not present

## 2018-09-10 MED ORDER — CYANOCOBALAMIN 1000 MCG/ML IJ SOLN
1000.0000 ug | Freq: Once | INTRAMUSCULAR | Status: AC
  Administered 2018-09-10: 1000 ug via INTRAMUSCULAR

## 2018-09-10 NOTE — Progress Notes (Signed)
Pt given Monthly B12 in Right Deltoid. Had lab drawn prior to shot.

## 2018-09-11 LAB — VITAMIN B12: Vitamin B-12: 258 pg/mL (ref 211–911)

## 2018-09-11 LAB — HEMOGLOBIN A1C: Hgb A1c MFr Bld: 5.9 % (ref 4.6–6.5)

## 2018-09-12 ENCOUNTER — Other Ambulatory Visit: Payer: Self-pay | Admitting: Family Medicine

## 2018-09-12 DIAGNOSIS — E1159 Type 2 diabetes mellitus with other circulatory complications: Secondary | ICD-10-CM

## 2018-09-12 DIAGNOSIS — E538 Deficiency of other specified B group vitamins: Secondary | ICD-10-CM

## 2018-09-12 MED ORDER — CYANOCOBALAMIN 1000 MCG/ML IJ SOLN: INTRAMUSCULAR | Status: DC

## 2018-09-16 ENCOUNTER — Other Ambulatory Visit: Payer: Self-pay | Admitting: Family Medicine

## 2018-09-25 ENCOUNTER — Ambulatory Visit (INDEPENDENT_AMBULATORY_CARE_PROVIDER_SITE_OTHER): Payer: Medicare Other

## 2018-09-25 DIAGNOSIS — E538 Deficiency of other specified B group vitamins: Secondary | ICD-10-CM

## 2018-09-25 MED ORDER — CYANOCOBALAMIN 1000 MCG/ML IJ SOLN
1000.0000 ug | Freq: Once | INTRAMUSCULAR | Status: AC
  Administered 2018-09-25: 1000 ug via INTRAMUSCULAR

## 2018-09-25 NOTE — Progress Notes (Signed)
Per orders of Dr. Sharen Hones, injection of Vitamin B12 given by Kizzie Ide, RN.  Administered to L deltoid IM.  Patient tolerated injection well.

## 2018-10-03 ENCOUNTER — Emergency Department (HOSPITAL_COMMUNITY): Payer: Medicare Other

## 2018-10-03 ENCOUNTER — Other Ambulatory Visit: Payer: Self-pay

## 2018-10-03 ENCOUNTER — Encounter (HOSPITAL_COMMUNITY): Payer: Self-pay | Admitting: Emergency Medicine

## 2018-10-03 ENCOUNTER — Emergency Department (HOSPITAL_COMMUNITY)
Admission: EM | Admit: 2018-10-03 | Discharge: 2018-10-03 | Disposition: A | Discharge status: Home / Self Care | Payer: Medicare Other | Attending: Emergency Medicine | Admitting: Emergency Medicine

## 2018-10-03 DIAGNOSIS — W19XXXA Unspecified fall, initial encounter: Secondary | ICD-10-CM | POA: Insufficient documentation

## 2018-10-03 DIAGNOSIS — M549 Dorsalgia, unspecified: Secondary | ICD-10-CM | POA: Insufficient documentation

## 2018-10-03 DIAGNOSIS — Y939 Activity, unspecified: Secondary | ICD-10-CM | POA: Insufficient documentation

## 2018-10-03 DIAGNOSIS — Z79899 Other long term (current) drug therapy: Secondary | ICD-10-CM | POA: Insufficient documentation

## 2018-10-03 DIAGNOSIS — R55 Syncope and collapse: Secondary | ICD-10-CM

## 2018-10-03 DIAGNOSIS — G8929 Other chronic pain: Secondary | ICD-10-CM | POA: Insufficient documentation

## 2018-10-03 DIAGNOSIS — Y929 Unspecified place or not applicable: Secondary | ICD-10-CM | POA: Diagnosis not present

## 2018-10-03 DIAGNOSIS — I10 Essential (primary) hypertension: Secondary | ICD-10-CM | POA: Diagnosis not present

## 2018-10-03 DIAGNOSIS — Y999 Unspecified external cause status: Secondary | ICD-10-CM | POA: Diagnosis not present

## 2018-10-03 DIAGNOSIS — S0101XA Laceration without foreign body of scalp, initial encounter: Secondary | ICD-10-CM | POA: Insufficient documentation

## 2018-10-03 DIAGNOSIS — E119 Type 2 diabetes mellitus without complications: Secondary | ICD-10-CM | POA: Insufficient documentation

## 2018-10-03 DIAGNOSIS — Z7982 Long term (current) use of aspirin: Secondary | ICD-10-CM | POA: Insufficient documentation

## 2018-10-03 DIAGNOSIS — S7011XA Contusion of right thigh, initial encounter: Secondary | ICD-10-CM

## 2018-10-03 DIAGNOSIS — E162 Hypoglycemia, unspecified: Secondary | ICD-10-CM

## 2018-10-03 DIAGNOSIS — I2510 Atherosclerotic heart disease of native coronary artery without angina pectoris: Secondary | ICD-10-CM | POA: Insufficient documentation

## 2018-10-03 LAB — CBC WITH DIFFERENTIAL/PLATELET
Abs Immature Granulocytes: 0.02 10*3/uL (ref 0.00–0.07)
Basophils Absolute: 0 10*3/uL (ref 0.0–0.1)
Basophils Relative: 0 %
Eosinophils Absolute: 0.9 10*3/uL — ABNORMAL HIGH (ref 0.0–0.5)
Eosinophils Relative: 9 %
HCT: 33.6 % — ABNORMAL LOW (ref 36.0–46.0)
Hemoglobin: 11 g/dL — ABNORMAL LOW (ref 12.0–15.0)
Immature Granulocytes: 0 %
Lymphocytes Relative: 24 %
Lymphs Abs: 2.4 10*3/uL (ref 0.7–4.0)
MCH: 30.6 pg (ref 26.0–34.0)
MCHC: 32.7 g/dL (ref 30.0–36.0)
MCV: 93.3 fL (ref 80.0–100.0)
Monocytes Absolute: 0.5 10*3/uL (ref 0.1–1.0)
Monocytes Relative: 5 %
Neutro Abs: 6.2 10*3/uL (ref 1.7–7.7)
Neutrophils Relative %: 62 %
Platelets: 295 10*3/uL (ref 150–400)
RBC: 3.6 MIL/uL — ABNORMAL LOW (ref 3.87–5.11)
RDW: 13.2 % (ref 11.5–15.5)
WBC: 10 10*3/uL (ref 4.0–10.5)
nRBC: 0 % (ref 0.0–0.2)

## 2018-10-03 LAB — URINALYSIS, ROUTINE W REFLEX MICROSCOPIC
Bilirubin Urine: NEGATIVE
Glucose, UA: NEGATIVE mg/dL
Hgb urine dipstick: NEGATIVE
Ketones, ur: NEGATIVE mg/dL
Leukocytes,Ua: NEGATIVE
Nitrite: NEGATIVE
Protein, ur: NEGATIVE mg/dL
Specific Gravity, Urine: 1.015 (ref 1.005–1.030)
pH: 6 (ref 5.0–8.0)

## 2018-10-03 LAB — BASIC METABOLIC PANEL
Anion gap: 11 (ref 5–15)
BUN: 11 mg/dL (ref 8–23)
CO2: 21 mmol/L — ABNORMAL LOW (ref 22–32)
Calcium: 8.8 mg/dL — ABNORMAL LOW (ref 8.9–10.3)
Chloride: 109 mmol/L (ref 98–111)
Creatinine, Ser: 0.87 mg/dL (ref 0.44–1.00)
GFR calc Af Amer: >60 mL/min (ref >60)
GFR calc non Af Amer: >60 mL/min (ref >60)
Glucose, Bld: 91 mg/dL (ref 70–99)
Potassium: 4.4 mmol/L (ref 3.5–5.1)
Sodium: 141 mmol/L (ref 135–145)

## 2018-10-03 LAB — CBG MONITORING, ED
Glucose-Capillary: 132 mg/dL — ABNORMAL HIGH (ref 70–99)
Glucose-Capillary: 61 mg/dL — ABNORMAL LOW (ref 70–99)
Glucose-Capillary: 138 mg/dL — ABNORMAL HIGH (ref 70–99)

## 2018-10-03 MED ORDER — FENTANYL CITRATE (PF) 100 MCG/2ML IJ SOLN
50.0000 ug | Freq: Once | INTRAMUSCULAR | Status: AC
  Administered 2018-10-03: 50 ug via INTRAVENOUS
  Filled 2018-10-03: qty 2

## 2018-10-03 MED ORDER — SODIUM CHLORIDE 0.9 % IV BOLUS
1000.0000 mL | Freq: Once | INTRAVENOUS | Status: AC
  Administered 2018-10-03: 1000 mL via INTRAVENOUS

## 2018-10-03 MED ORDER — DEXTROSE 50 % IV SOLN
INTRAVENOUS | Status: AC
  Administered 2018-10-03: 50 mL
  Filled 2018-10-03: qty 50

## 2018-10-03 NOTE — ED Notes (Addendum)
Yamily Vollmer [phone] husband, please call with updates

## 2018-10-03 NOTE — ED Notes (Signed)
EMT Note: Pt is able to stand and use bedside toilet, tolerated well with one assist.

## 2018-10-03 NOTE — ED Notes (Signed)
Patient transported to CT 

## 2018-10-03 NOTE — ED Notes (Signed)
Surgical center placed staples to right side of head.

## 2018-10-03 NOTE — ED Provider Notes (Signed)
[facility] EMERGENCY DEPARTMENT Provider Note   CSN: [id number] Arrival date & time: 10/03/18  1117    History   Chief Complaint Chief Complaint  Patient presents with   Loss of Consciousness    HPI Maria [name] is a 78 y.o. female.     The history is provided by the patient and medical records. No language interpreter was used.  Loss of Consciousness    78 year old female with history of diabetes, carotid disease, coronary artery disease, hyperlipidemia, PSVT presenting via EMS from surgical center for evaluation of a syncopal episode.  Patient states she has chronic back pain, but prior back surgery and has had recurrent leg pain.  She was scheduled to receive a spinal injection by neurosurgeon Dr. Danielle Dess today.  She was made n.p.o. last night.  When she arrived to the facility, she report feeling weak lightheadedness and subsequently had a witnessed syncopal episode while trying to go to the bathroom.  She struck the back of her head and suffered a laceration.  She did not have any documented seizure activity.  Per Dr. Danielle Dess, she had a brief transient difficulty moving her right leg which did improve and she was able to ambulate afterward.  Initial CBG was 79.  Patient report she fell her syncopal episode is likely due to her low blood sugar.  She did not eat her breakfast this morning and she took her diabetic medication, glipizide, last night prior to bed.  She denies any recent sickness.  She does complain of mild throbbing headache to the back of her head where she fell but denies any significant neck pain or confusion.  No complaints of chest pain or shortness of breath no productive cough.  Denies any abdominal pain.  She does complain of pain and swelling to her right hip which is new since the fall.  Pain is throbbing, moderate in severity.  No report of any numbness.  Does not complain of any tongue biting or urinary or bowel incontinence.  She is not on any  blood thinner medication.  She does not complain of any heart palpitation.  Past Medical History:  Diagnosis Date   Anemia 05/1985   Carotid artery disease (HCC)    a. 02/2015 - 1-39% bilaterally.   Chronic back pain    Concussion    Coronary artery disease    a. s/p DES to [facility] 01/2015.   DDD (degenerative disc disease), cervical    DDD (degenerative disc disease), lumbar    Fracture of lower leg 07/2001   Right   GERD (gastroesophageal reflux disease)    Hyperlipidemia    Hypertension    Multinodular thyroid    Pneumonia ?1990's X 1   PSVT (paroxysmal supraventricular tachycardia) (HCC)    a. s/p catheter ablation of a parahisian atrial tachycardia which was successfully ablated from the non-coronary cusp of the aortic root 06/2014.   Renal cyst 07/10/2006   bilateral   Right knee pain 02/2010   Injected - Dr. Shelle Iron   Sinus pause    a. By event monitoring 09/2014 - BB stopped at that time.   Syncope and collapse 07/2001   "that's when I broke my leg"   Type II diabetes mellitus (HCC) 2002    Patient Active Problem List   Diagnosis Date Noted   Hand pain 09/05/2018   Weakness 06/17/2018   B12 deficiency 06/16/2018   Neuropathy 06/13/2018   Neuropathic pain 05/19/2018   Edema 04/24/2018   Lumbosacral  radiculopathy at L4 02/04/2018   Right sided sciatica 10/02/2017   Neck pain 01/02/2017   Bruising 09/19/2015   Dysuria 06/15/2015   Carotid artery disease (HCC)    Coronary atherosclerosis of native coronary artery 01/22/2015   Angina pectoris (HCC) 01/22/2015   Chest pain 11/18/2014   Sinus node dysfunction (HCC) 11/02/2014   SVT (supraventricular tachycardia) (HCC) 07/06/2014   Benign paroxysmal positional vertigo 05/21/2014   Weight loss 06/09/2012   Functional diarrhea 02/01/2012   Type 2 diabetes mellitus with vascular disease (HCC) 02/13/2011   Shoulder pain, left 09/12/2010   ADJUSTMENT REACTION, ADULT 05/17/2010    KNEE PAIN, RIGHT 10/01/2009   SYNCOPE 02/22/2009   PSVT 11/12/2008   GERD 11/12/2008   Renal cyst 11/12/2008   Backache 11/12/2008   Multiple thyroid nodules 08/07/2007   Reflux esophagitis 08/07/2007   Hyperlipidemia 08/21/2006   ANEMIA-NOS 08/21/2006   DEPRESSION 08/21/2006   Essential hypertension 08/21/2006   DEGENERATIVE DISC DISEASE, CERVICAL SPINE 08/21/2006   DEGENERATIVE DISC DISEASE, LUMBAR SPINE 08/21/2006    Past Surgical History:  Procedure Laterality Date   ANTERIOR CERVICAL DECOMP/DISCECTOMY FUSION  10/03/2005   C4/5; C5/6  "it's got a plate in there"   ANTERIOR LAT LUMBAR FUSION Left 02/04/2018   Procedure: Lumbar two-three Lumbar three-four Lumbar four-five  Anterolateral decompression/percutaneous posterior arthrodesis, Mazor;  Surgeon: Barnett Abu, MD;  Location: [facility] OR;  Service: Neurosurgery;  Laterality: Left;   APPLICATION OF ROBOTIC ASSISTANCE FOR SPINAL PROCEDURE N/A 02/04/2018   Procedure: APPLICATION OF ROBOTIC ASSISTANCE FOR SPINAL PROCEDURE;  Surgeon: Barnett Abu, MD;  Location: [facility] OR;  Service: Neurosurgery;  Laterality: N/A;   BACK SURGERY     BREAST BIOPSY Bilateral 1968 (multiple)   "all benign"   BREAST LUMPECTOMY Bilateral 1968   CARDIAC CATHETERIZATION N/A 11/18/2014   Procedure: Left Heart Cath and Coronary Angiography;  Surgeon: Peter M Swaziland, MD;  Location: [facility] INVASIVE CV LAB;  Service: Cardiovascular;  Laterality: N/A;   CARDIAC CATHETERIZATION N/A 01/22/2015   Procedure: Coronary Stent Intervention;  Surgeon: Peter M Swaziland, MD;  Location: [facility] INVASIVE CV LAB;  Service: Cardiovascular;  Laterality: N/A;   CARDIAC CATHETERIZATION N/A 06/10/2015   Procedure: Left Heart Cath and Coronary Angiography;  Surgeon: Peter M Swaziland, MD;  Location: [facility] INVASIVE CV LAB;  Service: Cardiovascular;  Laterality: N/A;   CATARACT EXTRACTION W/ INTRAOCULAR LENS  IMPLANT, BILATERAL Bilateral 12/2010   CORONARY ANGIOPLASTY      ESOPHAGOGASTRODUODENOSCOPY  08/14/2005   gastropathy biopsy, negative   LAPAROSCOPIC CHOLECYSTECTOMY  08/1992   LUMBAR PERCUTANEOUS PEDICLE SCREW 3 LEVEL N/A 02/04/2018   Procedure: LUMBAR PERCUTANEOUS PEDICLE SCREW LUMBAR TWO - LUMBAR FIVE;  Surgeon: Barnett Abu, MD;  Location: [facility] OR;  Service: Neurosurgery;  Laterality: N/A;   POSTERIOR LAMINECTOMY / DECOMPRESSION LUMBAR SPINE  09/2001   due to herniated disc   SUPRAVENTRICULAR TACHYCARDIA ABLATION N/A 07/06/2014   Procedure: SUPRAVENTRICULAR TACHYCARDIA ABLATION;  Surgeon: Marinus Maw, MD;  Location: [facility] CATH LAB;  Service: Cardiovascular;  Laterality: N/A;   VAGINAL HYSTERECTOMY  1968   spotting     OB History   No obstetric history on file.      Home Medications    Prior to Admission medications   Medication Sig Start Date End Date Taking? Authorizing Provider  nitroGLYCERIN (NITROSTAT) 0.4 MG SL tablet PLACE 1 TABLET (0.4 MG TOTAL) UNDER THE TONGUE EVERY 5 (FIVE) MINUTES AS NEEDED FOR CHEST PAIN. 07/26/17  Yes Rosalio Macadamia, NP  amitriptyline (ELAVIL) 10 MG  tablet Take 1-3 tablets (10-30 mg total) by mouth at bedtime. 05/16/18   Joaquim Nam, MD  aspirin EC 81 MG tablet Take 81 mg by mouth once.     [provider]  atenolol (TENORMIN) 25 MG tablet Take 0.5 tablets (12.5 mg total) by mouth daily. 04/08/18   Wendall Stade, MD  cyanocobalamin (,VITAMIN B-12,) 1000 MCG/ML injection 1000 mcg injected IM every 14 days. Patient taking differently: Inject 1,000 mcg into the muscle every 14 (fourteen) days. 1000 mcg injected IM every 14 days. 09/12/18   Joaquim Nam, MD  glucose blood (ONE TOUCH ULTRA TEST) test strip USE AS INSTRUCTED TO TEST BLOOD SUGAR THREE TIMES DAILY. Dx Code E11.59 08/07/18   Joaquim Nam, MD  glyBURIDE (DIABETA) 5 MG tablet TAKE 1 TO 2 TABLETS BY MOUTH DAILY WITH BREAKFAST. Patient taking differently: Take 5 mg by mouth daily with breakfast.  09/16/18   Joaquim Nam, MD  loperamide  (IMODIUM) 2 MG capsule Take 2 mg by mouth 4 (four) times daily as needed for diarrhea or loose stools.     [provider]  omeprazole (PRILOSEC) 20 MG capsule Take 1 capsule (20 mg total) by mouth 2 (two) times daily as needed (for acid reflux/indigestion.). Patient not taking: Reported on 09/03/2018 04/23/18   Joaquim Nam, MD  pravastatin (PRAVACHOL) 20 MG tablet Take 1 tablet (20 mg total) by mouth every evening. 09/06/18 09/06/19  Wendall Stade, MD  traMADol (ULTRAM) 50 MG tablet TAKE 1-2 TABLETS BY MOUTH EVERY 8 HOURS AS NEEDED FOR MODERATE PAIN. MAY CAUSE SEDATION. Patient taking differently: Take 50 mg by mouth as needed. TAKE 1-2 TABLETS BY MOUTH EVERY 8 HOURS AS NEEDED FOR MODERATE PAIN. MAY CAUSE SEDATION. 06/18/18   Joaquim Nam, MD    Family History Family History  Problem Relation Age of Onset   Hypertension Mother    Heart disease Mother    Diabetes Sister    Myasthenia gravis Sister    Diabetes Brother        1/2 juvenile; foot ulcer; periph. neuropathy   Heart disease Maternal Grandfather        MI   Hypertension Maternal Grandfather    Depression Paternal Aunt    Hypertension Maternal Grandmother    Diabetes Maternal Grandmother    Stroke Maternal Grandmother    Hypertension Paternal Grandmother    Diabetes Paternal Grandmother    Hypertension Paternal Grandfather    Diabetes Paternal Grandfather    Breast cancer Maternal Aunt    Colon cancer Neg Hx     Social History Social History   Tobacco Use   Smoking status: Never Smoker   Smokeless tobacco: Never Used  Substance Use Topics   Alcohol use: No    Alcohol/week: 0.0 standard drinks   Drug use: No     Allergies   Crestor [rosuvastatin]; Ezetimibe-simvastatin; Pravastatin; Lyrica [pregabalin]; Nsaids; Praluent [alirocumab]; Acetaminophen; Carvedilol; Gabapentin; Glipizide; Ibuprofen; Insulin glargine; Levemir [insulin detemir]; Macrobid [nitrofurantoin macrocrystal];  Metformin; Metoprolol tartrate; and Naproxen   Review of Systems Review of Systems  Cardiovascular: Positive for syncope.  All other systems reviewed and are negative.    Physical Exam Updated Vital Signs BP (!) 154/81    Pulse 83    Temp 98.2 F (36.8 C) (Oral)    Resp 16    Ht 5\' 4"  (1.626 m)    Wt 65.8 kg    SpO2 100%    BMI 24.89 kg/m   Physical  Exam Vitals signs and nursing note reviewed.  Constitutional:      General: She is not in acute distress.    Appearance: She is well-developed.  HENT:     Head:     Comments: 2.5 cm horizontal laceration noted to right parietal region with surgical staple in place, it is mildly tender to palpation but no crepitus.    Nose: Nose normal.  Eyes:     Extraocular Movements: Extraocular movements intact.     Conjunctiva/sclera: Conjunctivae normal.     Pupils: Pupils are equal, round, and reactive to light.  Neck:     Musculoskeletal: Normal range of motion and neck supple.     Comments: No cervical midline spine tenderness crepitus or step-off. Cardiovascular:     Rate and Rhythm: Normal rate and regular rhythm.     Pulses: Normal pulses.     Heart sounds: Normal heart sounds.  Pulmonary:     Effort: Pulmonary effort is normal.     Breath sounds: Normal breath sounds.  Abdominal:     Palpations: Abdomen is soft.     Tenderness: There is no abdominal tenderness.  Musculoskeletal:        General: Tenderness (Right upper leg: An area of moderate edema and tenderness noted to lateral proximal thigh on palpation with normal hip range of motion and no deformity, no shortening of legs.) present.  Skin:    Capillary Refill: Capillary refill takes less than 2 seconds.     Findings: No rash.  Neurological:     Mental Status: She is alert and oriented to person, place, and time.  Psychiatric:        Mood and Affect: Mood normal.      ED Treatments / Results  Labs (all labs ordered are listed, but only abnormal results are  displayed) Labs Reviewed  BASIC METABOLIC PANEL - Abnormal; Notable for the following components:      Result Value   CO2 21 (*)    Calcium 8.8 (*)    All other components within normal limits  CBC WITH DIFFERENTIAL/PLATELET - Abnormal; Notable for the following components:   RBC 3.60 (*)    Hemoglobin 11.0 (*)    HCT 33.6 (*)    Eosinophils Absolute 0.9 (*)    All other components within normal limits  URINALYSIS, ROUTINE W REFLEX MICROSCOPIC - Abnormal; Notable for the following components:   Color, Urine STRAW (*)    APPearance HAZY (*)    All other components within normal limits  CBG MONITORING, ED - Abnormal; Notable for the following components:   Glucose-Capillary 61 (*)    All other components within normal limits  CBG MONITORING, ED - Abnormal; Notable for the following components:   Glucose-Capillary 132 (*)    All other components within normal limits  CBG MONITORING, ED - Abnormal; Notable for the following components:   Glucose-Capillary 138 (*)    All other components within normal limits    EKG EKG Interpretation  Date/Time:  Thursday Oct 03 2018 11:28:18 EDT Ventricular Rate:  86 PR Interval:    QRS Duration: 140 QT Interval:  406 QTC Calculation: 486 R Axis:   -69 Text Interpretation:  Sinus rhythm RBBB and LAFB Confirmed by Kristine RoyalMessick, Peter [phone]([id number]) on 10/03/2018 11:32:54 AM   Radiology Ct Head Wo Contrast  Result Date: 10/03/2018 CLINICAL DATA:  Minor head trauma.  Fall EXAM: CT HEAD WITHOUT CONTRAST TECHNIQUE: Contiguous axial images were obtained from the base of the skull through  the vertex without intravenous contrast. COMPARISON:  CT head 02/22/2017 FINDINGS: Brain: Mild atrophy. Moderate chronic microvascular ischemic changes in the white matter. Chronic cyst or lacune in the left basal ganglia unchanged. Negative for acute infarct.  Negative for hemorrhage or mass. Vascular: Negative for hyperdense vessel Skull: Negative for skull fracture. Right  parietal scalp laceration with skin staples. Sinuses/Orbits: Paranasal sinuses clear. Bilateral cataract surgery. Other: None IMPRESSION: No acute abnormality. Atrophy with moderate chronic microvascular ischemia in the white matter. Negative for skull fracture. Electronically Signed   By: Marlan Palau M.D.   On: 10/03/2018 12:53   Dg Hip Unilat W Or Wo Pelvis 2-3 Views Right  Result Date: 10/03/2018 CLINICAL DATA:  Fall. EXAM: DG HIP (WITH OR WITHOUT PELVIS) 2-3V RIGHT COMPARISON:  CT abdomen pelvis dated February 20, 2012. FINDINGS: No acute fracture or dislocation. The hip joint spaces are relatively preserved. Right hip chondrocalcinosis. The pubic symphysis and sacroiliac joints are intact. Prior lumbar fusion. Soft tissue swelling lateral to the right greater trochanter. IMPRESSION: 1. Soft tissue swelling lateral to the right greater trochanter. No acute osseous abnormality. Electronically Signed   By: Obie Dredge M.D.   On: 10/03/2018 12:19    Procedures .Critical Care Performed by: Fayrene Helper, PA-C Authorized by: Fayrene Helper, PA-C   Critical care provider statement:    Critical care time (minutes):  45   Critical care was time spent personally by me on the following activities:  Discussions with consultants, evaluation of patient's response to treatment, examination of patient, ordering and performing treatments and interventions, ordering and review of laboratory studies, ordering and review of radiographic studies, pulse oximetry, re-evaluation of patient's condition, obtaining history from patient or surrogate and review of old charts   (including critical care time)  Medications Ordered in ED Medications  dextrose 50 % solution (50 mLs  Given 10/03/18 1150)  sodium chloride 0.9 % bolus 1,000 mL (0 mLs Intravenous Stopped 10/03/18 1348)  fentaNYL (SUBLIMAZE) injection 50 mcg (50 mcg Intravenous Given 10/03/18 1307)     Initial Impression / Assessment and Plan / ED Course  I  have reviewed the triage vital signs and the nursing notes.  Pertinent labs & imaging results that were available during my care of the patient were reviewed by me and considered in my medical decision making (see chart for details).        BP (!) 165/68    Pulse 82    Temp 98.2 F (36.8 C) (Oral)    Resp 13    Ht  (1.626 m)    Wt 65.8 kg    SpO2 98%    BMI 24.89 kg/m    Final Clinical Impressions(s) / ED Diagnoses   Final diagnoses:  Syncope and collapse  Hypoglycemia  Hematoma of right thigh, initial encounter  Scalp laceration, initial encounter    ED Discharge Orders    None     12:00 PM Patient here due to a syncopal episode.  It is likely due to hypoglycemia as her current CBG is 61.  She was made n.p.o. for a lumbar injection today.  She has not had any procedure done yet.  She does have a laceration to her right parietal scalp that was surgically stapled by neurosurgeon Dr. Danielle Dess who has seen her initially.  She does complain of pain to her right lateral thigh and upper leg.  It appears she has a developing hematoma to this region.  Will obtain x-ray.  No seizure activity.  She is positive orthostasis therefore IV fluid given.  Patient also received an amp of D50 for hypoglycemia.  EKG shows right bundle branch block and left anterior fascicular block which is unchanged from prior.  No other concerning arrhythmia noted.  2:07 PM After giving food, an amp of D50 as well as multiple fingersticks, CBG has improved to 138 and patient felt better.  She has positive orthostatic vital sign, she received IV fluid.  Urine did not shows any signs of infection.  Electrolyte panels are reassuring.  No significant anemia.  CT scan of head is unremarkable.  X-ray of right hip and pelvis without acute bony pathology.  Soft tissue swelling noted lateral to the right greater trochanter.  Ice packs provided along with pain medication.  At this time, patient request to be discharged and  states she fears COVID-19 exposure.  Pt is aware that we do offer admission given her significant hematoma.  I suspect combination of taking glyburide, and not eating along with not drinking as preparation for her procedure today has caused her positive orthostasis and hypoglycemia causing syncope.  I encourage patient to call and follow-up with her neurosurgeon to reschedule her procedure.  Her surgical staple will need to be removed in 5 to 7 days.  Return precaution discussed.  Care discussed with Dr. Rodena Medin.   Fayrene Helper, PA-C 10/03/18 1433    Wynetta Fines, MD 10/04/18 [phone]

## 2018-10-03 NOTE — ED Notes (Signed)
Pt returned from CT °

## 2018-10-03 NOTE — ED Triage Notes (Signed)
[facility] syncopal episode. Pt there for chronic back pain and had LOC and fell. EMS reports pt hitting head. Denies blood thinners. Pt has swelling to right outer thigh but denies leg pain. No shortening or rotation noted.

## 2018-10-03 NOTE — Discharge Instructions (Signed)
You have been evaluated for your most recent passing out spell.  This is likely due to having low blood sugar and dehydration.  You suffered a laceration to your scalp.  Please have surgical staples remove in 5-7 days.  You also suffered a moderate size bruise to your right hip. Apply ice to affected area several times daily to aid healing.  Take over the counter pain medication as needed.  Return to the ER if your condition worsen or if you have other concerns.

## 2018-10-03 NOTE — ED Notes (Signed)
Transported to radiology 

## 2018-10-03 NOTE — ED Notes (Signed)
Nurse navigator helped patient call her husband who is waiting in the parking lot and updated plan of care. Patient alert resting comfortably.

## 2018-10-03 NOTE — ED Notes (Signed)
EMT Note: While obtaining orthostatic VS pt c/o pain in her right leg. Pt was unable to stand for the 3 minute BP. RN notified.

## 2018-10-04 ENCOUNTER — Ambulatory Visit: Payer: Self-pay | Admitting: *Deleted

## 2018-10-04 NOTE — Telephone Encounter (Signed)
I spoke with pt; FBS today was 107; pt has lost weight and BS been running in 70's. Pt did not take glyburide last night or this morning. Pt is OK except sore all over. Dr Para March said hold glyburide unless BS is > than 200. If > 200 start glyburide and let Dr Para March know. If BS drops low let Dr Para March know. If pt feels OK pt has virtual visit scheduled with Dr Para March on 10/08/18 at 11:30. ED precautions given and pt voiced understanding and appreciative. FYI to Dr Para March.

## 2018-10-04 NOTE — Telephone Encounter (Signed)
Noted. Thanks.

## 2018-10-04 NOTE — Telephone Encounter (Signed)
Zella Ball called from [facility] to request to have this patient triage. Pt passed out yesterday at an office visit and hit her head. Cut her head and has a hematoma on right hip. She was taking to the hospital and had a ct done of her head. She is concerned that since she has lost weight her blood sugars have been lower and at one time was in the 40's. Her blood sugars have been ranging into the 120's before. Yesterday they told her her blood sugar was low and that was why she passed out., she was given juice and it came back up. Will skype [facility] regarding an appointment or recommendation. Advised to call 911 if she passes out again. Her and her husband voiced understanding. Reason for Disposition . [1] Caller has URGENT medication or insulin pump question AND [2] triager unable to answer question  Answer Assessment - Initial Assessment Questions 1. SYMPTOMS: "What symptoms are you concerned about?"     Feeling faint, passing out,  2. ONSET:  "When did the symptoms start?"     yesterday 3. BLOOD GLUCOSE: "What is your blood glucose level?"      107 this morning 4. USUAL RANGE: "What is your blood glucose level usually?" (e.g., usual fasting morning value, usual evening value)      70's in the morningusually 5. TYPE 1 or 2:  "Do you know what type of diabetes you have?"  (e.g., Type 1, Type 2, Gestational; doesn't know)      Type 2 6. INSULIN: "Do you take insulin?" "What type of insulin(s) do you use? What is the mode of delivery? (syringe, pen (e.g., injection or  pump)      no 7. DIABETES PILLS: "Do you take any pills for your diabetes?"     yes 8. OTHER SYMPTOMS: "Do you have any symptoms?" (e.g., fever, frequent urination, difficulty breathing, vomiting)     no 9. LOW BLOOD GLUCOSE TREATMENT: "What have you done so far to treat the low blood glucose level?"     Eat or drink mik 10. FOOD: "When did you last eat or drink?"       Not yet 11. ALONE: "Are you alone right now or is  someone with you?"        Husband is with her 12. PREGNANCY: "Is there any chance you are pregnant?" "When was your last menstrual period?"       n/a  Protocols used: DIABETES - LOW BLOOD SUGAR-A-AH

## 2018-10-08 ENCOUNTER — Telehealth: Payer: Self-pay

## 2018-10-08 ENCOUNTER — Ambulatory Visit (INDEPENDENT_AMBULATORY_CARE_PROVIDER_SITE_OTHER): Payer: Medicare Other | Admitting: Family Medicine

## 2018-10-08 ENCOUNTER — Telehealth: Payer: Self-pay | Admitting: Cardiovascular Disease

## 2018-10-08 DIAGNOSIS — R55 Syncope and collapse: Secondary | ICD-10-CM

## 2018-10-08 DIAGNOSIS — E1159 Type 2 diabetes mellitus with other circulatory complications: Secondary | ICD-10-CM

## 2018-10-08 DIAGNOSIS — E538 Deficiency of other specified B group vitamins: Secondary | ICD-10-CM | POA: Diagnosis not present

## 2018-10-08 DIAGNOSIS — S0101XS Laceration without foreign body of scalp, sequela: Secondary | ICD-10-CM

## 2018-10-08 MED ORDER — CYANOCOBALAMIN 1000 MCG/ML IJ SOLN: INTRAMUSCULAR | Status: DC

## 2018-10-08 NOTE — Telephone Encounter (Signed)
New message:   Patient calling concering getting in to see the doctor as soon as possible. Please call patient.

## 2018-10-08 NOTE — Telephone Encounter (Signed)
Called and LMOVM.  I didn't leave confidential info.  I'll route this to Dr. Danielle Dess as Maria [name].  I talked with the patient this AM.  She is going to be off all DM2 meds and she'll update me about her sugar in about 1 week.  We'll go from there.   I appreciate the help of all involved.

## 2018-10-08 NOTE — Telephone Encounter (Signed)
Called patient back about her message. Patient was in the ED recently for syncope and was wanting to follow up with Dr. Eden [name] due to her EKG report RBBB and LAFB. Informed patient that she had this on previous EKG in February, but she is due for 6 month f/u appt. Made a virtual visit with Dr. Eden [name] next week. Patient verbalized understanding and gave consent to do virtual visit.       Virtual Visit Pre-Appointment Phone Call  "(Name), I am calling you today to discuss your upcoming appointment. We are currently trying to limit exposure to the virus that causes COVID-19 by seeing patients at home rather than in the office."  1. "What is the BEST phone number to call the day of the visit?" - include this in appointment notes  2. Do you have or have access to (through a family member/friend) a smartphone with video capability that we can use for your visit?" a. If yes - list this number in appt notes as cell (if different from BEST phone #) and list the appointment type as a VIDEO visit in appointment notes b. If no - list the appointment type as a PHONE visit in appointment notes  3. Confirm consent - "In the setting of the current Covid19 crisis, you are scheduled for a (phone or video) visit with your provider on (date) at (time).  Just as we do with many in-office visits, in order for you to participate in this visit, we must obtain consent.  If you'd like, I can send this to your mychart (if signed up) or email for you to review.  Otherwise, I can obtain your verbal consent now.  All virtual visits are billed to your insurance company just like a normal visit would be.  By agreeing to a virtual visit, we'd like you to understand that the technology does not allow for your provider to perform an examination, and thus may limit your provider's ability to fully assess your condition. If your provider identifies any concerns that need to be evaluated in person, we will make arrangements to do so.   Finally, though the technology is pretty good, we cannot assure that it will always work on either your or our end, and in the setting of a video visit, we may have to convert it to a phone-only visit.  In either situation, we cannot ensure that we have a secure connection.  Are you willing to proceed? Yes  4. Advise patient to be prepared - "Two hours prior to your appointment, go ahead and check your blood pressure, pulse, oxygen saturation, and your weight (if you have the equipment to check those) and write them all down. When your visit starts, your provider will ask you for this information. If you have an Apple Watch or Kardia device, please plan to have heart rate information ready on the day of your appointment. Please have a pen and paper handy nearby the day of the visit as well."  5. Give patient instructions for MyChart download to smartphone OR Doximity/Doxy.me as below if video visit (depending on what platform provider is using)  6. Inform patient they will receive a phone call 15 minutes prior to their appointment time (may be from unknown caller ID) so they should be prepared to answer    TELEPHONE CALL NOTE  Maria [name] has been deemed a candidate for a follow-up tele-health visit to limit community exposure during the Covid-19 pandemic. I spoke with the patient via  phone to ensure availability of phone/video source, confirm preferred email & phone number, and discuss instructions and expectations.  I reminded Maria Maria [name] to be prepared with any vital sign and/or heart rhythm information that could potentially be obtained via home monitoring, at the time of her visit. I reminded Maria [name] to expect a phone call prior to her visit.  Maria Chick, RN 10/08/2018 1:36 PM  IF USING DOXIMITY or DOXY.ME - The patient will receive a link just prior to their visit by text.     FULL LENGTH CONSENT FOR TELE-HEALTH VISIT   I hereby voluntarily request, consent and  authorize [facility] and its employed or contracted physicians, physician assistants, nurse practitioners or other licensed health care professionals (the Practitioner), to provide me with telemedicine health care services (the Services") as deemed necessary by the treating Practitioner. I acknowledge and consent to receive the Services by the Practitioner via telemedicine. I understand that the telemedicine visit will involve communicating with the Practitioner through live audiovisual communication technology and the disclosure of certain medical information by electronic transmission. I acknowledge that I have been given the opportunity to request an in-person assessment or other available alternative prior to the telemedicine visit and am voluntarily participating in the telemedicine visit.  I understand that I have the right to withhold or withdraw my consent to the use of telemedicine in the course of my care at any time, without affecting my right to future care or treatment, and that the Practitioner or I may terminate the telemedicine visit at any time. I understand that I have the right to inspect all information obtained and/or recorded in the course of the telemedicine visit and may receive copies of available information for a reasonable fee.  I understand that some of the potential risks of receiving the Services via telemedicine include:   Delay or interruption in medical evaluation due to technological equipment failure or disruption;  Information transmitted may not be sufficient (e.g. poor resolution of images) to allow for appropriate medical decision making by the Practitioner; and/or   In rare instances, security protocols could fail, causing a breach of personal health information.  Furthermore, I acknowledge that it is my responsibility to provide information about my medical history, conditions and care that is complete and accurate to the best of my ability. I acknowledge that  Practitioner's advice, recommendations, and/or decision may be based on factors not within their control, such as incomplete or inaccurate data provided by me or distortions of diagnostic images or specimens that may result from electronic transmissions. I understand that the practice of medicine is not an exact science and that Practitioner makes no warranties or guarantees regarding treatment outcomes. I acknowledge that I will receive a copy of this consent concurrently upon execution via email to the email address I last provided but may also request a printed copy by calling the office of [facility].    I understand that my insurance will be billed for this visit.   I have read or had this consent read to me.  I understand the contents of this consent, which adequately explains the benefits and risks of the Services being provided via telemedicine.   I have been provided ample opportunity to ask questions regarding this consent and the Services and have had my questions answered to my satisfaction.  I give my informed consent for the services to be provided through the use of telemedicine in my medical care  By participating  in this telemedicine visit I agree to the above.

## 2018-10-08 NOTE — Progress Notes (Signed)
Interactive audio and video telecommunications were attempted between this provider and patient, however failed, due to patient having technical difficulties OR patient did not have access to video capability.  We continued and completed visit with audio only.   Virtual Visit via Telephone Note  I connected with patient on 10/08/18 at 11:50 AM by telephone and verified that I am speaking with the correct person using two identifiers.  Location of patient: home  Location of MD: [facility] Name of referring provider (if blank then none associated): Names per persons and role in encounter:  MD: Ferd Hibbs, Patient: name listed above.    I discussed the limitations, risks, security and privacy concerns of performing an evaluation and management service by telephone and the availability of in person appointments. I also discussed with the patient that there may be a patient responsible charge related to this service. The patient expressed understanding and agreed to proceed.  CC follow up.   History of Present Illness:   Recent episode of hypoglycemia.  She was going for injection with neurosurgery clinic.  Her sugar was not high the AM of the procedure.  She hadn't had breakfast that day.  She was walking down the hall to the bathroom at the facility.  She passed out, with some warning that she was going to have trouble.  She was transferred to ER.    She is going to f/u with cardiology about RBBB and LAFB.  She'll call about follow up.  I will await cardiology notes  She took 1/2 tab glyburide last night and sugar was 107 this AM.  D/w pt about stopping med totally.  Goal to avoid hypoglycemia.  Discussed with patient that it is reasonable to check sugar in the AM and then as needed.    She is going to f/u with Dr. Danielle Dess about staple removal.   She isn't having HAs.     Observations/Objective: No apparent distress Speech normal.  Assessment and Plan: Scalp laceration.  She is  going to follow-up with Dr. Danielle Dess about staple removal.  She does not have ongoing headaches or postconcussive symptoms at this point.  Diabetes.  With hypoglycemia recently noted..  Would stop glyburide and update me in about 1 week.  We can make plans at that point regarding follow-up.  Routine pandemic considerations discussed with patient  Syncope.  Likely related to hypoglycemia and this should be addressed by stopping glyburide.  She is going to follow-up with cardiology.  Discussed with patient.  I will await cardiology follow-up notes.  Routine cautions given to patient.  Follow Up Instructions: See above   I discussed the assessment and treatment plan with the patient. The patient was provided an opportunity to ask questions and all were answered. The patient agreed with the plan and demonstrated an understanding of the instructions.   The patient was advised to call back or seek an in-person evaluation if the symptoms worsen or if the condition fails to improve as anticipated.  I provided 22 minutes of non-face-to-face time during this encounter.  Crawford Givens, MD

## 2018-10-08 NOTE — Telephone Encounter (Signed)
Dr Danielle Dess called about an episode she had at their office last week. Blood sugar 70. Gave Apple Juice. Pt passed out while walking down the hall to get infusion. Husband has said this is a common occurrence. She is falling frequently. Was sent to ER. Had a laceration. Call him at [phone] if you have any questions. He will be in surgery most of the day from this point.

## 2018-10-09 DIAGNOSIS — S0101XA Laceration without foreign body of scalp, initial encounter: Secondary | ICD-10-CM | POA: Insufficient documentation

## 2018-10-09 NOTE — Assessment & Plan Note (Signed)
She is going to follow-up with Dr. Danielle Dess about staple removal.  She does not have ongoing headaches or postconcussive symptoms at this point.

## 2018-10-09 NOTE — Assessment & Plan Note (Signed)
With hypoglycemia recently noted..  Would stop glyburide and update me in about 1 week.  We can make plans at that point regarding follow-up.

## 2018-10-09 NOTE — Assessment & Plan Note (Signed)
Likely related to hypoglycemia and this should be addressed by stopping glyburide.  She is going to follow-up with cardiology.  Discussed with patient.  I will await cardiology follow-up notes.  Routine cautions given to patient

## 2018-10-10 NOTE — Progress Notes (Signed)
Virtual Visit via Video Note   This visit type was conducted due to national recommendations for restrictions regarding the COVID-19 Pandemic (e.g. social distancing) in an effort to limit this patient's exposure and mitigate transmission in our community.  Due to her co-morbid illnesses, this patient is at least at moderate risk for complications without adequate follow up.  This format is felt to be most appropriate for this patient at this time.  All issues noted in this document were discussed and addressed.  A limited physical exam was performed with this format.  Please refer to the patient's chart for her consent to telehealth for [facility].   Date:  10/15/2018   ID:  Maria [name], DOB [birth year], MRN [id number]  Patient Location: Home Provider Location: Office  PCP:  Joaquim Nam, MD  Cardiologist:  Charlton Haws, MD   Electrophysiologist:  None   Evaluation Performed:  Follow-Up Visit  Chief Complaint:  CAD  History of Present Illness:    Maria [name] is a 78 y.o. female with CAD. DES to circumflex 2016. History of HTN, DM, GERD, HLD intolerant to statins Dyspnea with Repatha. Also hisotry of SVT ablation in 2016 Last seen by PA 01/17/18 for preoperative clearance Needed back surgery Myovue done 01/21/18 normal EF 72%  Seen in ER 10/03/18 with "syncope"  She was at Dr Verlee Rossetti office to get spinal injection and had not eaten with lots of back and left leg pain Passed out while going to bathroom BS 61  but she did not eat and had taken her glipizide She was given D50 and saline for ? orthostasis No cardiac issues noted   Seeing neurology for neuropathy next week Started in September after back surgery Elavil helps her rest   The patient does not have symptoms concerning for COVID-19 infection (fever, chills, cough, or new shortness of breath).    Past Medical History:  Diagnosis Date  . Anemia 05/1985  . Carotid artery disease (HCC)    a. 02/2015 - 1-39%  bilaterally.  . Chronic back pain   . Concussion   . Coronary artery disease    a. s/p DES to [facility] 01/2015.  . DDD (degenerative disc disease), cervical   . DDD (degenerative disc disease), lumbar   . Fracture of lower leg 07/2001   Right  . GERD (gastroesophageal reflux disease)   . Hyperlipidemia   . Hypertension   . Multinodular thyroid   . Pneumonia ?1990's X 1  . PSVT (paroxysmal supraventricular tachycardia) (HCC)    a. s/p catheter ablation of a parahisian atrial tachycardia which was successfully ablated from the non-coronary cusp of the aortic root 06/2014.  [name] Renal cyst 07/10/2006   bilateral  . Right knee pain 02/2010   Injected - Dr. Shelle Iron  . Sinus pause    a. By event monitoring 09/2014 - BB stopped at that time.  . Syncope and collapse 07/2001   "that's when I broke my leg"  . Type II diabetes mellitus (HCC) 2002   Past Surgical History:  Procedure Laterality Date  . ANTERIOR CERVICAL DECOMP/DISCECTOMY FUSION  10/03/2005   C4/5; C5/6  "it's got a plate in there"  . ANTERIOR LAT LUMBAR FUSION Left 02/04/2018   Procedure: Lumbar two-three Lumbar three-four Lumbar four-five  Anterolateral decompression/percutaneous posterior arthrodesis, Mazor;  Surgeon: Barnett Abu, MD;  Location: [facility] OR;  Service: Neurosurgery;  Laterality: Left;  . APPLICATION OF ROBOTIC ASSISTANCE FOR SPINAL PROCEDURE N/A 02/04/2018   Procedure: APPLICATION OF  ROBOTIC ASSISTANCE FOR SPINAL PROCEDURE;  Surgeon: Barnett AbuElsner, Henry, MD;  [facility];  Service: Neurosurgery;  Laterality: N/A;  . BACK SURGERY    . BREAST BIOPSY Bilateral 1968 (multiple)   "all benign"  . BREAST LUMPECTOMY Bilateral 1968  . CARDIAC CATHETERIZATION N/A 11/18/2014   Procedure: Left Heart Cath and Coronary Angiography;  Surgeon: Peter M SwazilandJordan, MD;  [facility];  Service: Cardiovascular;  Laterality: N/A;  . CARDIAC CATHETERIZATION N/A 01/22/2015   Procedure: Coronary Stent Intervention;  Surgeon: Peter M SwazilandJordan,  MD;  [facility];  Service: Cardiovascular;  Laterality: N/A;  . CARDIAC CATHETERIZATION N/A 06/10/2015   Procedure: Left Heart Cath and Coronary Angiography;  Surgeon: Peter M SwazilandJordan, MD;  [facility];  Service: Cardiovascular;  Laterality: N/A;  . CATARACT EXTRACTION W/ INTRAOCULAR LENS  IMPLANT, BILATERAL Bilateral 12/2010  . CORONARY ANGIOPLASTY    . ESOPHAGOGASTRODUODENOSCOPY  08/14/2005   gastropathy biopsy, negative  . LAPAROSCOPIC CHOLECYSTECTOMY  08/1992  . LUMBAR PERCUTANEOUS PEDICLE SCREW 3 LEVEL N/A 02/04/2018   Procedure: LUMBAR PERCUTANEOUS PEDICLE SCREW LUMBAR TWO - LUMBAR FIVE;  Surgeon: Barnett AbuElsner, Henry, MD;  Location: [facility] OR;  Service: Neurosurgery;  Laterality: N/A;  . POSTERIOR LAMINECTOMY / DECOMPRESSION LUMBAR SPINE  09/2001   due to herniated disc  . SUPRAVENTRICULAR TACHYCARDIA ABLATION N/A 07/06/2014   Procedure: SUPRAVENTRICULAR TACHYCARDIA ABLATION;  Surgeon: Marinus MawGregg W Taylor, MD;  [facility];  Service: Cardiovascular;  Laterality: N/A;  . VAGINAL HYSTERECTOMY  1968   spotting     Current Meds  Medication Sig  . amitriptyline (ELAVIL) 10 MG tablet Take 1-3 tablets (10-30 mg total) by mouth at bedtime.  [name]. aspirin EC 81 MG tablet Take 81 mg by mouth once.   [name]. atenolol (TENORMIN) 25 MG tablet Take 0.5 tablets (12.5 mg total) by mouth daily.  . cyanocobalamin (,VITAMIN B-12,) 1000 MCG/ML injection 1000 mcg injected IM every 14 days.  [name]. glucose blood (ONE TOUCH ULTRA TEST) test strip USE AS INSTRUCTED TO TEST BLOOD SUGAR THREE TIMES DAILY. Dx Code E11.59  . loperamide (IMODIUM) 2 MG capsule Take 2 mg by mouth 4 (four) times daily as needed for diarrhea or loose stools.   . nitroGLYCERIN (NITROSTAT) 0.4 MG SL tablet PLACE 1 TABLET (0.4 MG TOTAL) UNDER THE TONGUE EVERY 5 (FIVE) MINUTES AS NEEDED FOR CHEST PAIN.  [name]. omeprazole (PRILOSEC) 20 MG capsule Take 1 capsule (20 mg total) by mouth 2 (two) times daily as needed (for acid  reflux/indigestion.).  [name]. pravastatin (PRAVACHOL) 20 MG tablet Take 1 tablet (20 mg total) by mouth every evening.  . traMADol (ULTRAM) 50 MG tablet TAKE 1-2 TABLETS BY MOUTH EVERY 8 HOURS AS NEEDED FOR MODERATE PAIN. MAY CAUSE SEDATION.     Allergies:   Crestor [rosuvastatin]; Ezetimibe-simvastatin; Pravastatin; Lyrica [pregabalin]; Nsaids; Praluent [alirocumab]; Acetaminophen; Carvedilol; Gabapentin; Glipizide; Ibuprofen; Insulin glargine; Levemir [insulin detemir]; Macrobid [nitrofurantoin macrocrystal]; Metformin; Metoprolol tartrate; and Naproxen   Social History   Tobacco Use  . Smoking status: Never Smoker  . Smokeless tobacco: Never Used  Substance Use Topics  . Alcohol use: No    Alcohol/week: 0.0 standard drinks  . Drug use: No     Family Hx: The patient's family history includes Breast cancer in her maternal aunt; Depression in her paternal aunt; Diabetes in her brother, maternal grandmother, paternal grandfather, paternal grandmother, and sister; Heart disease in her maternal grandfather and mother; Hypertension in her maternal grandfather, maternal grandmother, mother,  paternal grandfather, and paternal grandmother; Myasthenia gravis in her sister; Stroke in her maternal grandmother. There is no history of Colon cancer.  ROS:   Please see the history of present illness.     All other systems reviewed and are negative.   Prior CV studies:   The following studies were reviewed today:  Myovue 9/919    Labs/Other Tests and Data Reviewed:    EKG:   NSR no acute changes 10/07/18 chronic RBBB /LAFB   Recent Labs: 06/13/2018: TSH 2.13 10/03/2018: BUN 11; Creatinine, Ser 0.87; Hemoglobin 11.0; Platelets 295; Potassium 4.4; Sodium 141   Recent Lipid Panel Lab Results  Component Value Date/Time   CHOL 106 10/26/2016 08:48 AM   TRIG 88 10/26/2016 08:48 AM   HDL 45 10/26/2016 08:48 AM   CHOLHDL 2.4 10/26/2016 08:48 AM   CHOLHDL 5 07/05/2016 04:02 PM   LDLCALC 43  10/26/2016 08:48 AM   LDLDIRECT 168.1 02/18/2010 07:51 AM    Wt Readings from Last 3 Encounters:  10/15/18 64 kg  10/03/18 65.8 kg  07/18/18 65.4 kg     Objective:    Vital Signs:  Ht  (1.626 m)   Wt 64 kg   BMI 24.20 kg/m    Telephone no exam   ASSESSMENT & PLAN:    1. CAD:  DES to circumflex 2016 normal myovue September 2019 stable 2. Syncope:  Seems vagal related to low BS and pain  3. HLD:  Back on pravastatin qod  4. DM:  Discussed low carb diet.  Target hemoglobin A1c is 6.5 or less.  Continue current medications. 5. Back:  Chronic pain f/u Elsner  6. RBBB/LAFB:  Chronic yearly ECG not related to her "syncope" has had for years   COVID-19 Education: The signs and symptoms of COVID-19 were discussed with the patient and how to seek care for testing (follow up with PCP or arrange E-visit).  The importance of social distancing was discussed today.  Time:   Today, I have spent 30 minutes with the patient with telehealth technology discussing the above problems.     Medication Adjustments/Labs and Tests Ordered: Current medicines are reviewed at length with the patient today.  Concerns regarding medicines are outlined above.   Tests Ordered: No orders of the defined types were placed in this encounter.   Medication Changes: No orders of the defined types were placed in this encounter.   Disposition:  Follow up in 6 months  Signed, Charlton Haws, MD  10/15/2018 4:12 PM    [facility]

## 2018-10-15 ENCOUNTER — Encounter: Payer: Self-pay | Admitting: Cardiovascular Disease

## 2018-10-15 ENCOUNTER — Other Ambulatory Visit: Payer: Self-pay

## 2018-10-15 ENCOUNTER — Telehealth (INDEPENDENT_AMBULATORY_CARE_PROVIDER_SITE_OTHER): Payer: Medicare Other | Admitting: Cardiovascular Disease

## 2018-10-15 VITALS — Ht 64.0 in | Wt 141.0 lb

## 2018-10-15 DIAGNOSIS — I2510 Atherosclerotic heart disease of native coronary artery without angina pectoris: Secondary | ICD-10-CM | POA: Diagnosis not present

## 2018-10-15 NOTE — Patient Instructions (Addendum)
Medication Instructions:   If you need a refill on your cardiac medications before your next appointment, please call your pharmacy.   Lab work: Your physician recommends that you return for lab work in: 3 months for fasting lipid and liver panel.  If you have labs (blood work) drawn today and your tests are completely normal, you will receive your results only by: [name] MyChart Message (if you have MyChart) OR . A paper copy in the mail If you have any lab test that is abnormal or we need to change your treatment, we will call you to review the results.  Testing/Procedures: None ordered today.  Follow-Up: At [facility], you and your health needs are our priority.  As part of our continuing mission to provide you with exceptional heart care, we have created designated Provider Care Teams.  These Care Teams include your primary Cardiologist (physician) and Advanced Practice Providers (APPs -  Physician Assistants and Nurse Practitioners) who all work together to provide you with the care you need, when you need it. You will need a follow up appointment in 6 months.  Please call our office 2 months in advance to schedule this appointment.  You may see Charlton Haws, MD or one of the following Advanced Practice Providers on your designated Care Team:   Norma Fredrickson, NP Nada Boozer, NP . Georgie Chard, NP

## 2018-10-16 ENCOUNTER — Telehealth: Payer: Self-pay | Admitting: Pharmacist

## 2018-10-16 ENCOUNTER — Ambulatory Visit (INDEPENDENT_AMBULATORY_CARE_PROVIDER_SITE_OTHER): Payer: Medicare Other

## 2018-10-16 DIAGNOSIS — E538 Deficiency of other specified B group vitamins: Secondary | ICD-10-CM | POA: Diagnosis not present

## 2018-10-16 DIAGNOSIS — E1159 Type 2 diabetes mellitus with other circulatory complications: Secondary | ICD-10-CM

## 2018-10-16 MED ORDER — EZETIMIBE 10 MG PO TABS: 10.0000 mg | ORAL_TABLET | Freq: Every day | ORAL | 11 refills | Status: DC

## 2018-10-16 MED ORDER — CYANOCOBALAMIN 1000 MCG/ML IJ SOLN
1000.0000 ug | Freq: Once | INTRAMUSCULAR | Status: AC
  Administered 2018-10-16: 1000 ug via INTRAMUSCULAR

## 2018-10-16 NOTE — Telephone Encounter (Signed)
I thank you for your help.  I put in the follow-up lipid order.

## 2018-10-16 NOTE — Addendum Note (Signed)
Addended by: Joaquim Nam on: 10/16/2018 02:10 PM   Modules accepted: Orders

## 2018-10-16 NOTE — Progress Notes (Signed)
Per orders of Dr. Sharen Hones for Dr. Para March due to his being out of the office,  injection of Vitamin B12 given by Kizzie Ide, RN.  Administered to right deltoid IM.   Patient tolerated injection well.

## 2018-10-16 NOTE — Telephone Encounter (Signed)
Called pt to discuss lipid lowering therapy - she decreased her pravastatin 20mg  to every other day about 2 weeks ago due to weakness/myalgias on daily dosing. She reports feeling much better on QOD dosing. She is agreeable to starting ezetimibe 10mg  daily in addition to her pravastatin. Will recheck lipids in 2-3 months - she has lab work scheduled in August before her appt with Dr Para March. Will send him a message to see if he can add a lipid panel to her lab orders.

## 2018-10-17 NOTE — Telephone Encounter (Signed)
Called patient and advised her due to current COVID 19 pandemic, our office is severely reducing in person visits in order to minimize the risk to our patients and healthcare providers.  I advised if she prefers to come in we need to reschedule for Tues afternoon this month or for July. She asked to come in July; we rescheduled. She  verbalized understanding, appreciation.

## 2018-10-21 ENCOUNTER — Ambulatory Visit: Payer: Self-pay | Admitting: Diagnostic Neuroimaging

## 2018-10-29 ENCOUNTER — Telehealth: Payer: Self-pay

## 2018-10-29 NOTE — Telephone Encounter (Signed)
Copied from [place] [id number]. Topic: Quick Communication - See Telephone Encounter >> Oct 28, 2018  4:10 PM Loma Boston wrote: CRM for notification. See Telephone encounter for: 10/28/18. AFTER HRS  Pt called and wants to have an appt for her B-12 shot. Pls FU at [phone]

## 2018-10-30 NOTE — Telephone Encounter (Signed)
Pt scheduled for 10/31/18 @ 2:15pm

## 2018-10-31 ENCOUNTER — Other Ambulatory Visit: Payer: Self-pay

## 2018-10-31 ENCOUNTER — Ambulatory Visit (INDEPENDENT_AMBULATORY_CARE_PROVIDER_SITE_OTHER): Payer: Medicare Other | Admitting: *Deleted

## 2018-10-31 DIAGNOSIS — E538 Deficiency of other specified B group vitamins: Secondary | ICD-10-CM

## 2018-10-31 MED ORDER — CYANOCOBALAMIN 1000 MCG/ML IJ SOLN
1000.0000 ug | Freq: Once | INTRAMUSCULAR | Status: AC
  Administered 2018-10-31: 1000 ug via INTRAMUSCULAR

## 2018-10-31 NOTE — Progress Notes (Signed)
Per orders of Dr. Duncan, injection of B12 1000 mcg/mL  given by Reece Fehnel. Patient tolerated injection well.  

## 2018-11-13 ENCOUNTER — Telehealth: Payer: Self-pay | Admitting: Family Medicine

## 2018-11-13 MED ORDER — AMITRIPTYLINE HCL 10 MG PO TABS: 10.0000 mg | ORAL_TABLET | Freq: Every day | ORAL | 3 refills | Status: DC

## 2018-11-13 NOTE — Telephone Encounter (Signed)
Patient returned Anastasiya's call and I let her know her rx was sent in to pharmacy.

## 2018-11-13 NOTE — Telephone Encounter (Signed)
Sent. Thanks.   

## 2018-11-13 NOTE — Telephone Encounter (Signed)
Called patient to let her know but voicemail was full and I could not leave a message

## 2018-11-13 NOTE — Telephone Encounter (Signed)
Pt called to get refill for amitriptyline. Dr Ellene Route refilled last in May. Pt is out of medication, said she has contacted pharmacy but needs updated refill request. Please send to [facility] on [address].

## 2018-11-13 NOTE — Telephone Encounter (Signed)
Patient advised by Morey Hummingbird

## 2018-11-18 ENCOUNTER — Encounter: Payer: Self-pay | Admitting: Diagnostic Neuroimaging

## 2018-11-18 ENCOUNTER — Other Ambulatory Visit: Payer: Self-pay

## 2018-11-18 ENCOUNTER — Ambulatory Visit: Payer: Medicare Other | Admitting: Diagnostic Neuroimaging

## 2018-11-18 VITALS — BP 132/77 | HR 83 | Temp 98.6°F | Ht 66.0 in | Wt 149.4 lb

## 2018-11-18 DIAGNOSIS — G8929 Other chronic pain: Secondary | ICD-10-CM

## 2018-11-18 DIAGNOSIS — R200 Anesthesia of skin: Secondary | ICD-10-CM

## 2018-11-18 DIAGNOSIS — M5442 Lumbago with sciatica, left side: Secondary | ICD-10-CM

## 2018-11-18 DIAGNOSIS — M5441 Lumbago with sciatica, right side: Secondary | ICD-10-CM | POA: Diagnosis not present

## 2018-11-18 DIAGNOSIS — G629 Polyneuropathy, unspecified: Secondary | ICD-10-CM

## 2018-11-18 NOTE — Progress Notes (Signed)
[facility]  PATIENT: Maria ManuelBarbara D Schoffstall DOB: May 26, 1940  REFERRING CLINICIAN: Saintclair HalstedG Duncan HISTORY FROM: patient  REASON FOR VISIT: new consult    HISTORICAL  CHIEF COMPLAINT:  Chief Complaint  Patient presents with  . New Patient (Initial Visit)    Referral from Dr. Crawford GivensGraham Duncan fro neuropathy room 6 pt with husband Roanna EpleyGarland and his temp is 98.0    HISTORY OF PRESENT ILLNESS:   78 year old female here for evaluation of low back pain and lower extremity numbness.  2019 patient had low back pain rating to the right leg.  She underwent lumbar spine surgery in September 2019.  Postoperatively her right leg pain improved but then she started to develop left leg and foot pain.  She had numbness, pain, redness and swelling in the left leg and foot.  This was evaluated by PCP and neurosurgery.  At some point patient was diagnosed with "neuropathy" and prescribed amitriptyline which seemed to help.  Patient has diabetes which is under good control.  Also has B12 deficiency, now under treatment.   REVIEW OF SYSTEMS: Full 14 system review of systems performed and negative with exception of: As per HPI.  ALLERGIES: Allergies  Allergen Reactions  . Crestor [Rosuvastatin] Other (See Comments)    MYALGIAS  . Ezetimibe-Simvastatin Other (See Comments)    MYALGIAS  . Pravastatin Other (See Comments)    Myalgias  . Lyrica [Pregabalin]     swelling  . Nsaids     UNSPECIFIED REACTION   . Praluent [Alirocumab]     aches  . Acetaminophen Anxiety and Other (See Comments)    REACTION: jittery with plain tylenol, but tolerated tylenol/benadryl combination  . Carvedilol Other (See Comments)    nightmares  . Gabapentin Diarrhea  . Glipizide Other (See Comments)    GI intolerance.    . Ibuprofen Other (See Comments)    REACTION: GI upset at high doses  . Insulin Glargine Rash    Rash  . Levemir [Insulin Detemir] Rash  . Macrobid [Nitrofurantoin Macrocrystal] Nausea And  Vomiting and Other (See Comments)    ACHES  . Metformin Nausea And Vomiting    REACTION: GI upset  . Metoprolol Tartrate Other (See Comments)    REACTION: nightmares  . Naproxen Nausea And Vomiting    REACTION: GI upset    HOME MEDICATIONS: Outpatient Medications Prior to Visit  Medication Sig Dispense Refill  . amitriptyline (ELAVIL) 10 MG tablet Take 1-3 tablets (10-30 mg total) by mouth at bedtime. 90 tablet 3  . aspirin EC 81 MG tablet Take 81 mg by mouth once.     [name]. atenolol (TENORMIN) 25 MG tablet Take 0.5 tablets (12.5 mg total) by mouth daily. 45 tablet 3  . cyanocobalamin (,VITAMIN B-12,) 1000 MCG/ML injection 1000 mcg injected IM every 14 days.    [name]. ezetimibe (ZETIA) 10 MG tablet Take 1 tablet (10 mg total) by mouth daily. 30 tablet 11  . glucose blood (ONE TOUCH ULTRA TEST) test strip USE AS INSTRUCTED TO TEST BLOOD SUGAR THREE TIMES DAILY. Dx Code E11.59 100 each 3  . loperamide (IMODIUM) 2 MG capsule Take 2 mg by mouth 4 (four) times daily as needed for diarrhea or loose stools.     . nitroGLYCERIN (NITROSTAT) 0.4 MG SL tablet PLACE 1 TABLET (0.4 MG TOTAL) UNDER THE TONGUE EVERY 5 (FIVE) MINUTES AS NEEDED FOR CHEST PAIN. 25 tablet 1  . omeprazole (PRILOSEC) 20 MG capsule Take 1 capsule (20 mg total) by mouth 2 (  two) times daily as needed (for acid reflux/indigestion.).    [name]. pravastatin (PRAVACHOL) 20 MG tablet Take 1 tablet (20 mg total) by mouth every evening. 30 tablet 11  . traMADol (ULTRAM) 50 MG tablet TAKE 1-2 TABLETS BY MOUTH EVERY 8 HOURS AS NEEDED FOR MODERATE PAIN. MAY CAUSE SEDATION.     No facility-administered medications prior to visit.     PAST MEDICAL HISTORY: Past Medical History:  Diagnosis Date  . Anemia 05/1985  . Carotid artery disease (HCC)    a. 02/2015 - 1-39% bilaterally.  . Chronic back pain   . Concussion   . Coronary artery disease    [facility]  . DDD (degenerative disc disease), cervical   . DDD (degenerative disc  disease), lumbar   . Fracture of lower leg 07/2001   Right  . GERD (gastroesophageal reflux disease)   . Hyperlipidemia   . Hypertension   . Multinodular thyroid   . Pneumonia ?1990's X 1  . PSVT (paroxysmal supraventricular tachycardia) (HCC)    a. s/p catheter ablation of a parahisian atrial tachycardia which was successfully ablated from the non-coronary cusp of the aortic root 06/2014.  [name]. Renal cyst 07/10/2006   bilateral  . Right knee pain 02/2010   Injected - Dr. Shelle IronBeane  . Sinus pause    a. By event monitoring 09/2014 - BB stopped at that time.  . Syncope and collapse 07/2001   "that's when I broke my leg"  . Type II diabetes mellitus (HCC) 2002    PAST SURGICAL HISTORY: Past Surgical History:  Procedure Laterality Date  . ANTERIOR CERVICAL DECOMP/DISCECTOMY FUSION  10/03/2005   C4/5; C5/6  "it's got a plate in there"  . ANTERIOR LAT LUMBAR FUSION Left 02/04/2018   Procedure: Lumbar two-three Lumbar three-four Lumbar four-five  Anterolateral decompression/percutaneous posterior arthrodesis, Mazor;  Surgeon: Barnett AbuElsner, Henry, MD;  Location: [facility] OR;  Service: Neurosurgery;  Laterality: Left;  . APPLICATION OF ROBOTIC ASSISTANCE FOR SPINAL PROCEDURE N/A 02/04/2018   Procedure: APPLICATION OF ROBOTIC ASSISTANCE FOR SPINAL PROCEDURE;  Surgeon: Barnett AbuElsner, Henry, MD;  Location: [facility] OR;  Service: Neurosurgery;  Laterality: N/A;  . BACK SURGERY    . BREAST BIOPSY Bilateral 1968 (multiple)   "all benign"  . BREAST LUMPECTOMY Bilateral 1968  . CARDIAC CATHETERIZATION N/A 11/18/2014   Procedure: Left Heart Cath and Coronary Angiography;  Surgeon: Peter M SwazilandJordan, MD;  [facility];  Service: Cardiovascular;  Laterality: N/A;  . CARDIAC CATHETERIZATION N/A 01/22/2015   Procedure: Coronary Stent Intervention;  Surgeon: Peter M SwazilandJordan, MD;  [facility];  Service: Cardiovascular;  Laterality: N/A;  . CARDIAC CATHETERIZATION N/A 06/10/2015   Procedure: Left Heart Cath and  Coronary Angiography;  Surgeon: Peter M SwazilandJordan, MD;  [facility];  Service: Cardiovascular;  Laterality: N/A;  . CATARACT EXTRACTION W/ INTRAOCULAR LENS  IMPLANT, BILATERAL Bilateral 12/2010  . CORONARY ANGIOPLASTY    . ESOPHAGOGASTRODUODENOSCOPY  08/14/2005   gastropathy biopsy, negative  . LAPAROSCOPIC CHOLECYSTECTOMY  08/1992  . LUMBAR PERCUTANEOUS PEDICLE SCREW 3 LEVEL N/A 02/04/2018   Procedure: LUMBAR PERCUTANEOUS PEDICLE SCREW LUMBAR TWO - LUMBAR FIVE;  Surgeon: Barnett AbuElsner, Henry, MD;  Location: [facility] OR;  Service: Neurosurgery;  Laterality: N/A;  . POSTERIOR LAMINECTOMY / DECOMPRESSION LUMBAR SPINE  09/2001   due to herniated disc  . SUPRAVENTRICULAR TACHYCARDIA ABLATION N/A 07/06/2014   Procedure: SUPRAVENTRICULAR TACHYCARDIA ABLATION;  Surgeon: Marinus MawGregg W Taylor, MD;  [facility];  Service: Cardiovascular;  Laterality: N/A;  . VAGINAL HYSTERECTOMY  1968   spotting    FAMILY HISTORY: Family History  Problem Relation Age of Onset  . Hypertension Mother   . Heart disease Mother   . Diabetes Sister   . Myasthenia gravis Sister   . Diabetes Brother        1/2 juvenile; foot ulcer; periph. neuropathy  . Heart disease Maternal Grandfather        MI  . Hypertension Maternal Grandfather   . Depression Paternal Aunt   . Hypertension Maternal Grandmother   . Diabetes Maternal Grandmother   . Stroke Maternal Grandmother   . Hypertension Paternal Grandmother   . Diabetes Paternal Grandmother   . Hypertension Paternal Grandfather   . Diabetes Paternal Grandfather   . Breast cancer Maternal Aunt   . Colon cancer Neg Hx     SOCIAL HISTORY: Social History   Socioeconomic History  . Marital status: Married    Spouse name: Not on file  . Number of children: 2  . Years of education: Not on file  . Highest education level: Not on file  Occupational History  . Occupation: Retired; Doctor, general practice, [employer]    Employer: RETIRED    Comment: Works  a couple days a week  Social Needs  . Financial resource strain: Not on file  . Food insecurity    Worry: Not on file    Inability: Not on file  . Transportation needs    Medical: Not on file    Non-medical: Not on file  Tobacco Use  . Smoking status: Never Smoker  . Smokeless tobacco: Never Used  Substance and Sexual Activity  . Alcohol use: No    Alcohol/week: 0.0 standard drinks  . Drug use: No  . Sexual activity: Yes  Lifestyle  . Physical activity    Days per week: Not on file    Minutes per session: Not on file  . Stress: Not on file  Relationships  . Social Herbalist on phone: Not on file    Gets together: Not on file    Attends religious service: Not on file    Active member of club or organization: Not on file    Attends meetings of clubs or organizations: Not on file    Relationship status: Not on file  . Intimate partner violence    Fear of current or ex partner: Not on file    Emotionally abused: Not on file    Physically abused: Not on file    Forced sexual activity: Not on file  Other Topics Concern  . Not on file  Social History Narrative   Widowed 2011 after 22 years of marriage (husband had C.diff)   Remarried 2015     PHYSICAL EXAM  GENERAL EXAM/CONSTITUTIONAL: Vitals:  Vitals:   11/18/18 1034  BP: 132/77  Pulse: 83  Temp: 98.6 F (37 C)  Weight: 149 lb 6.4 oz (67.8 kg)  Height: 5\' 6"  (1.676 m)     Body mass index is 24.11 kg/m. Wt Readings from Last 3 Encounters:  11/18/18 149 lb 6.4 oz (67.8 kg)  10/15/18 141 lb (64 kg)  10/03/18 145 lb (65.8 kg)     Patient is in no distress; well developed, nourished and groomed; neck is supple  CARDIOVASCULAR:  Examination of carotid arteries is normal; no carotid bruits  Regular rate and rhythm, no murmurs  Examination of peripheral vascular system by observation and palpation  is normal  EYES:  Ophthalmoscopic exam of optic discs and posterior segments is normal; no  papilledema or hemorrhages  No exam data present  MUSCULOSKELETAL:  Gait, strength, tone, movements noted in Neurologic exam below  NEUROLOGIC: MENTAL STATUS:  MMSE - Mini Mental State Exam 07/05/2016 07/05/2016  Orientation to time 5 5  Orientation to Place 5 5  Registration 3 3  Attention/ Calculation 0 0  Recall 3 3  Language- name 2 objects 0 0  Language- repeat 1 1  Language- follow 3 step command 3 3  Language- read & follow direction 0 0  Write a sentence 0 0  Copy design 0 0  Total score 20 20    awake, alert, oriented to person, place and time  recent and remote memory intact  normal attention and concentration  language fluent, comprehension intact, naming intact  fund of knowledge appropriate  CRANIAL NERVE:   2nd - no papilledema on fundoscopic exam  2nd, 3rd, 4th, 6th - pupils equal and reactive to light, visual fields full to confrontation, extraocular muscles intact, no nystagmus  5th - facial sensation symmetric  7th - facial strength symmetric  8th - hearing intact  9th - palate elevates symmetrically, uvula midline  11th - shoulder shrug symmetric  12th - tongue protrusion midline  MOTOR:   normal bulk and tone, full strength in the BUE, BLE  SENSORY:   normal and symmetric to light touch, temperature, vibration  COORDINATION:   finger-nose-finger, fine finger movements normal  REFLEXES:   deep tendon reflexes TRACE and symmetric  GAIT/STATION:   narrow based gait     DIAGNOSTIC DATA (LABS, IMAGING, TESTING) - I reviewed patient records, labs, notes, testing and imaging myself where available.  Lab Results  Component Value Date   WBC 10.0 10/03/2018   HGB 11.0 (L) 10/03/2018   HCT 33.6 (L) 10/03/2018   MCV 93.3 10/03/2018   PLT 295 10/03/2018      Component Value Date/Time   NA 141 10/03/2018 1145   K 4.4 10/03/2018 1145   CL 109 10/03/2018 1145   CO2 21 (L) 10/03/2018 1145   GLUCOSE 91 10/03/2018 1145   BUN  11 10/03/2018 1145   CREATININE 0.87 10/03/2018 1145   CREATININE 0.97 (H) 06/09/2015 1012   CALCIUM 8.8 (L) 10/03/2018 1145   PROT 6.1 10/26/2016 0848   ALBUMIN 4.1 10/26/2016 0848   AST 17 10/26/2016 0848   ALT 15 10/26/2016 0848   ALKPHOS 92 10/26/2016 0848   BILITOT 0.5 10/26/2016 0848   GFRNONAA >60 10/03/2018 1145   GFRAA >60 10/03/2018 1145   Lab Results  Component Value Date   CHOL 106 10/26/2016   HDL 45 10/26/2016   LDLCALC 43 10/26/2016   LDLDIRECT 168.1 02/18/2010   TRIG 88 10/26/2016   CHOLHDL 2.4 10/26/2016   Lab Results  Component Value Date   HGBA1C 5.9 09/10/2018   Vitamin B-12  Date Value Ref Range Status  09/10/2018 258 211 - 911 pg/mL Final  06/13/2018 149 (L) 211 - 911 pg/mL Final   Lab Results  Component Value Date   TSH 2.13 06/13/2018    07/16/18 MRI lumbar spine (without)  [I reviewed images myself and agree with interpretation. -VRP]  1. L2-L5 PLIF and interbody fusion. 2. Stable L1-2 left foraminal moderate to severe multifactorial stenosis. Otherwise no significant residual spinal canal stenosis or foraminal stenosis. 3. No acute osseous abnormality. No abnormal enhancement of cauda equina    ASSESSMENT AND PLAN  78 y.o. year old female here with:  Dx:  1. Numbness   2. Neuropathy   3. Chronic bilateral low back pain with bilateral sciatica      PLAN:  NEUROPATHY - continue B12 replacement, diabetes tx, amitriptyline  BACK pain - continue PT exercises  Return for return to PCP.    Suanne MarkerVIKRAM R. PENUMALLI, MD 11/18/2018, 11:04 AM Certified in Neurology, Neurophysiology and Neuroimaging  [facility] [address], [address] [place], [place] [address] [phone]

## 2018-11-27 ENCOUNTER — Ambulatory Visit (INDEPENDENT_AMBULATORY_CARE_PROVIDER_SITE_OTHER): Payer: Medicare Other | Admitting: *Deleted

## 2018-11-27 ENCOUNTER — Other Ambulatory Visit: Payer: Self-pay

## 2018-11-27 DIAGNOSIS — E538 Deficiency of other specified B group vitamins: Secondary | ICD-10-CM

## 2018-11-27 MED ORDER — CYANOCOBALAMIN 1000 MCG/ML IJ SOLN
1000.0000 ug | Freq: Once | INTRAMUSCULAR | Status: AC
  Administered 2018-11-27: 1000 ug via INTRAMUSCULAR

## 2018-11-27 NOTE — Progress Notes (Addendum)
Per orders of Dr. Damita Dunnings, injection of Vit B12 given by Nyoka Cowden, Candies Palm M. Patient tolerated injection well.  Agree.  Thanks.  Maria [name] .12/06/18

## 2018-12-12 ENCOUNTER — Ambulatory Visit (INDEPENDENT_AMBULATORY_CARE_PROVIDER_SITE_OTHER): Payer: Medicare Other | Admitting: *Deleted

## 2018-12-12 DIAGNOSIS — E538 Deficiency of other specified B group vitamins: Secondary | ICD-10-CM | POA: Diagnosis not present

## 2018-12-12 MED ORDER — CYANOCOBALAMIN 1000 MCG/ML IJ SOLN
1000.0000 ug | Freq: Once | INTRAMUSCULAR | Status: AC
  Administered 2018-12-12: 15:00:00 1000 ug via INTRAMUSCULAR

## 2018-12-12 NOTE — Progress Notes (Signed)
Per orders of Dr. Duncan, injection of B12 given by Kalii Chesmore M. Patient tolerated injection well. 

## 2018-12-18 ENCOUNTER — Ambulatory Visit (INDEPENDENT_AMBULATORY_CARE_PROVIDER_SITE_OTHER): Payer: Medicare Other | Admitting: Family Medicine

## 2018-12-18 ENCOUNTER — Other Ambulatory Visit: Payer: Medicare Other

## 2018-12-18 ENCOUNTER — Encounter: Payer: Self-pay | Admitting: Family Medicine

## 2018-12-18 VITALS — Ht 65.0 in | Wt 144.0 lb

## 2018-12-18 DIAGNOSIS — R300 Dysuria: Secondary | ICD-10-CM | POA: Diagnosis not present

## 2018-12-18 DIAGNOSIS — R8290 Unspecified abnormal findings in urine: Secondary | ICD-10-CM

## 2018-12-18 DIAGNOSIS — N390 Urinary tract infection, site not specified: Secondary | ICD-10-CM

## 2018-12-18 LAB — POCT URINALYSIS DIPSTICK
Bilirubin, UA: NEGATIVE
Blood, UA: NEGATIVE
Glucose, UA: NEGATIVE
Ketones, UA: NEGATIVE
Leukocytes, UA: NEGATIVE
Nitrite, UA: NEGATIVE
Protein, UA: NEGATIVE
Spec Grav, UA: 1.02 (ref 1.010–1.025)
Urobilinogen, UA: 0.2 E.U./dL
pH, UA: 5.5 (ref 5.0–8.0)

## 2018-12-18 MED ORDER — CEPHALEXIN 500 MG PO CAPS: 500.0000 mg | ORAL_CAPSULE | Freq: Two times a day (BID) | ORAL | 0 refills | Status: AC

## 2018-12-18 NOTE — Progress Notes (Signed)
Virtual Visit via Telephone Note  I connected with Maria [name] on 12/18/18 at  4:00 PM EDT by telephone and verified that I am speaking with the correct person using two identifiers.  Location: Patient: in her home Provider: [place]   I discussed the limitations, risks, security and privacy concerns of performing an evaluation and management service by telephone and the availability of in person appointments. I also discussed with the patient that there may be a patient responsible charge related to this service. The patient expressed understanding and agreed to proceed.   History of Present Illness: This is a 78 year old female who requests virtual visit today to discuss urinary tract symptoms.  She did drop off her urine earlier today which I have reviewed. She reports a 2 to 3-day history of dysuria, cloudy urine and strong odor.  She denies fever, abdominal pain, nausea/vomiting, new or unusual back pain.  Last UTI was April 2020.  She got good relief with cephalexin.   Observations/Objective: The patient is alert and answers questions appropriately.  She is normally conversive without sounding short of breath.  No audible wheeze or witnessed cough.  Ht 5\' 5"  (1.651 m)   Wt 144 lb (65.3 kg)   BMI 23.96 kg/m  Results for orders placed or performed in visit on 12/18/18  Urinalysis Dipstick  Result Value Ref Range   Color, UA light yellow    Clarity, UA clear    Glucose, UA Negative Negative   Bilirubin, UA neg    Ketones, UA neg    Spec Grav, UA 1.020 1.010 - 1.025   Blood, UA neg    pH, UA 5.5 5.0 - 8.0   Protein, UA Negative Negative   Urobilinogen, UA 0.2 0.2 or 1.0 E.U./dL   Nitrite, UA neg    Leukocytes, UA Negative Negative   Appearance     Odor       Assessment and Plan: 1. Burning with urination -Patient reports that her symptoms are consistent with all of her other urinary tract infections and she prefers not to wait for culture results prior to  starting antibiotic.  She reports that cephalexin has worked well for her in the past and request this be sent in for her today. -Agreed to go ahead and start antibiotic while awaiting culture.  Encouraged her to increase fluids. -Return to clinic/ER precautions reviewed- fever over 100, abdominal pain, vomiting, back pain - Urinalysis Dipstick-negative leukocytes negative nitrites negative blood - cephALEXin (KEFLEX) 500 MG capsule; Take 1 capsule (500 mg total) by mouth 2 (two) times daily for 7 days.  Dispense: 14 capsule; Refill: 0 - Urine Culture  2. Abnormal urine odor - Urinalysis Dipstick - cephALEXin (KEFLEX) 500 MG capsule; Take 1 capsule (500 mg total) by mouth 2 (two) times daily for 7 days.  Dispense: 14 capsule; Refill: 0 - Urine Culture  3. Frequent UTI - Urine Culture   Maria Reamer, FNP-BC  [facility]  12/18/2018 4:18 PM   Follow Up Instructions:    I discussed the assessment and treatment plan with the patient. The patient was provided an opportunity to ask questions and all were answered. The patient agreed with the plan and demonstrated an understanding of the instructions.   The patient was advised to call back or seek an in-person evaluation if the symptoms worsen or if the condition fails to improve as anticipated.  I provided 5 minutes of non-face-to-face time during  this encounter.   Maria Belfasteborah B Gessner, FNP

## 2018-12-19 ENCOUNTER — Ambulatory Visit: Payer: Medicare Other

## 2018-12-20 LAB — URINE CULTURE
MICRO NUMBER:: 739673
SPECIMEN QUALITY:: ADEQUATE

## 2018-12-26 ENCOUNTER — Ambulatory Visit (INDEPENDENT_AMBULATORY_CARE_PROVIDER_SITE_OTHER): Payer: Medicare Other

## 2018-12-26 DIAGNOSIS — E538 Deficiency of other specified B group vitamins: Secondary | ICD-10-CM

## 2018-12-26 MED ORDER — CYANOCOBALAMIN 1000 MCG/ML IJ SOLN
1000.0000 ug | Freq: Once | INTRAMUSCULAR | Status: AC
  Administered 2018-12-26: 1000 ug via INTRAMUSCULAR

## 2018-12-26 NOTE — Progress Notes (Signed)
Per orders of Dr. Duncan, injection of vit B12 given by Remijio Holleran. Patient tolerated injection well.  

## 2018-12-30 ENCOUNTER — Other Ambulatory Visit: Payer: Self-pay | Admitting: Family Medicine

## 2018-12-30 DIAGNOSIS — E1159 Type 2 diabetes mellitus with other circulatory complications: Secondary | ICD-10-CM

## 2018-12-30 DIAGNOSIS — E538 Deficiency of other specified B group vitamins: Secondary | ICD-10-CM

## 2019-01-02 ENCOUNTER — Other Ambulatory Visit (INDEPENDENT_AMBULATORY_CARE_PROVIDER_SITE_OTHER): Payer: Medicare Other

## 2019-01-02 DIAGNOSIS — E1159 Type 2 diabetes mellitus with other circulatory complications: Secondary | ICD-10-CM | POA: Diagnosis not present

## 2019-01-02 DIAGNOSIS — E538 Deficiency of other specified B group vitamins: Secondary | ICD-10-CM

## 2019-01-02 LAB — COMPREHENSIVE METABOLIC PANEL
ALT: 10 U/L (ref 0–35)
AST: 13 U/L (ref 0–37)
Albumin: 4.2 g/dL (ref 3.5–5.2)
Alkaline Phosphatase: 89 U/L (ref 39–117)
BUN: 16 mg/dL (ref 6–23)
CO2: 29 mEq/L (ref 19–32)
Calcium: 9.7 mg/dL (ref 8.4–10.5)
Chloride: 104 mEq/L (ref 96–112)
Creatinine, Ser: 0.98 mg/dL (ref 0.40–1.20)
GFR: 54.69 mL/min — ABNORMAL LOW (ref >60.00)
Glucose, Bld: 116 mg/dL — ABNORMAL HIGH (ref 70–99)
Potassium: 5.1 mEq/L (ref 3.5–5.1)
Sodium: 141 mEq/L (ref 135–145)
Total Bilirubin: 0.4 mg/dL (ref 0.2–1.2)
Total Protein: 6.7 g/dL (ref 6.0–8.3)

## 2019-01-02 LAB — LIPID PANEL
Cholesterol: 199 mg/dL (ref 0–200)
HDL: 64.2 mg/dL (ref >39.00)
LDL Cholesterol: 122 mg/dL — ABNORMAL HIGH (ref 0–99)
NonHDL: 134.58
Total CHOL/HDL Ratio: 3
Triglycerides: 63 mg/dL (ref 0.0–149.0)
VLDL: 12.6 mg/dL (ref 0.0–40.0)

## 2019-01-02 LAB — VITAMIN B12: Vitamin B-12: 881 pg/mL (ref 211–911)

## 2019-01-02 LAB — HEMOGLOBIN A1C: Hgb A1c MFr Bld: 7 % — ABNORMAL HIGH (ref 4.6–6.5)

## 2019-01-09 ENCOUNTER — Encounter: Payer: Self-pay | Admitting: Family Medicine

## 2019-01-09 ENCOUNTER — Other Ambulatory Visit: Payer: Self-pay

## 2019-01-09 ENCOUNTER — Ambulatory Visit (INDEPENDENT_AMBULATORY_CARE_PROVIDER_SITE_OTHER): Payer: Medicare Other | Admitting: Family Medicine

## 2019-01-09 VITALS — BP 142/84 | HR 95 | Temp 98.0°F | Ht 65.0 in | Wt 152.4 lb

## 2019-01-09 DIAGNOSIS — E1159 Type 2 diabetes mellitus with other circulatory complications: Secondary | ICD-10-CM

## 2019-01-09 DIAGNOSIS — E782 Mixed hyperlipidemia: Secondary | ICD-10-CM | POA: Diagnosis not present

## 2019-01-09 DIAGNOSIS — E538 Deficiency of other specified B group vitamins: Secondary | ICD-10-CM

## 2019-01-09 DIAGNOSIS — Z23 Encounter for immunization: Secondary | ICD-10-CM

## 2019-01-09 MED ORDER — CYANOCOBALAMIN 1000 MCG/ML IJ SOLN
1000.0000 ug | Freq: Once | INTRAMUSCULAR | Status: AC
  Administered 2019-01-09: 12:00:00 1000 ug via INTRAMUSCULAR

## 2019-01-09 NOTE — Progress Notes (Signed)
Diabetes:  No meds.  Hypoglycemic episodes: no Hyperglycemic episodes:no Feet problems: she is putting up with neuropathy, she is careful walking.  No plantar numbness for L dorsal foot numbness.   Blood Sugars averaging: 120-170s eye exam within last year: due, d/w pt.  Labs d/w pt.  A1c 7.    Elevated Cholesterol: Using medications without problems:yes Muscle aches: no Diet compliance: yes Exercise: as tolerated, walking as tolerated. She is making some progress.  Off pravastatin, on zetia with no ADE on med. She recently increased to daily zetia.    Meds, vitals, and allergies reviewed.   ROS: Per HPI unless specifically indicated in ROS section   GEN: nad, alert and oriented HEENT: ncat NECK: supple w/o LA CV: rrr. PULM: ctab, no inc wob ABD: soft, +bs EXT: no edema SKIN: no acute rash  Diabetic foot exam: Normal inspection No skin breakdown No calluses  Normal DP pulses Normal sensation to light touch and monofilament except for mild decreased sensation with light touch on the dorsum of the left foot Nails normal

## 2019-01-09 NOTE — Patient Instructions (Addendum)
Continue as is with B12 shots every 2 weeks.  Flu shot today.  Recheck labs prior to a visit in about 3-4 months.  Update me as needed.  Take care.  Glad to see you.  Call about an eye exam when possible.

## 2019-01-10 ENCOUNTER — Encounter: Payer: Self-pay | Admitting: Pharmacist

## 2019-01-10 ENCOUNTER — Telehealth: Payer: Self-pay | Admitting: Pharmacist

## 2019-01-10 DIAGNOSIS — E782 Mixed hyperlipidemia: Secondary | ICD-10-CM

## 2019-01-10 NOTE — Telephone Encounter (Signed)
Called pt to follow up with lipids, LDL 122 on most recent labs above goal < 70 due to CAD. Labs reflect pt taking Zetia every other day, last week she increased her dose to every day. She is tolerating daily Zetia well so far.  She stopped her pravastatin 20mg  every other day due to myalgias. She is also intolerant to rosuvastatin 5mg  every other day, Vytorin, Repatha (hoarse voice), and Praluent (myalgias).  Discussed adding Nexletol or changing to Nexlizet, she wishes to have lipids rechecked in another few months since she increased the frequency of her Zetia. Discussed that this will still not bring her LDL to goal - will follow up with pt at that time to discuss adding Nexletol again.

## 2019-01-12 NOTE — Assessment & Plan Note (Signed)
Off pravastatin, on zetia with no ADE on med. She recently increased to daily zetia.  We can recheck her labs later on.  Continue work on diet.  She agrees.

## 2019-01-12 NOTE — Assessment & Plan Note (Signed)
Recheck labs prior to a visit in about 3-4 months.  No change in meds at this point.  See after visit summary.  She agrees.  Foot care discussed with patient.

## 2019-02-06 ENCOUNTER — Ambulatory Visit (INDEPENDENT_AMBULATORY_CARE_PROVIDER_SITE_OTHER): Payer: Medicare Other

## 2019-02-06 DIAGNOSIS — E538 Deficiency of other specified B group vitamins: Secondary | ICD-10-CM | POA: Diagnosis not present

## 2019-02-06 MED ORDER — CYANOCOBALAMIN 1000 MCG/ML IJ SOLN
1000.0000 ug | Freq: Once | INTRAMUSCULAR | Status: AC
  Administered 2019-02-06: 1000 ug via INTRAMUSCULAR

## 2019-02-06 NOTE — Progress Notes (Signed)
Per orders of Dr. Damita Dunnings, injection of B12-every 2 weeks given by Kris Mouton. Patient tolerated injection well.

## 2019-02-09 ENCOUNTER — Encounter: Payer: Self-pay | Admitting: Family Medicine

## 2019-02-20 ENCOUNTER — Telehealth: Payer: Self-pay | Admitting: Cardiovascular Disease

## 2019-02-20 NOTE — Telephone Encounter (Signed)
[place] neurology Dr Carles Collet or Posey Pronto are nice Primary would need to refer

## 2019-02-20 NOTE — Telephone Encounter (Signed)
New Message:  The patient wanted to let Dr. Johnsie Cancel know that she was unhappy with the previous doctor that was treating her Neuropathy. The patient mentioned that Dr. Johnsie Cancel had some other recommendations for doctors, but she could not remember their names. If Dr. Johnsie Cancel could recommend other doctors to treat her Neuropathy

## 2019-02-25 ENCOUNTER — Ambulatory Visit: Payer: Medicare Other

## 2019-03-11 ENCOUNTER — Ambulatory Visit (INDEPENDENT_AMBULATORY_CARE_PROVIDER_SITE_OTHER): Payer: Medicare Other

## 2019-03-11 ENCOUNTER — Telehealth: Payer: Self-pay

## 2019-03-11 DIAGNOSIS — E538 Deficiency of other specified B group vitamins: Secondary | ICD-10-CM | POA: Diagnosis not present

## 2019-03-11 MED ORDER — CYANOCOBALAMIN 1000 MCG/ML IJ SOLN
1000.0000 ug | Freq: Once | INTRAMUSCULAR | Status: AC
  Administered 2019-03-11: 1000 ug via INTRAMUSCULAR

## 2019-03-11 NOTE — Telephone Encounter (Signed)
Yes, just not between 1 and 2 and please ask her to schedule these appts in advance to avoid this situation.

## 2019-03-11 NOTE — Telephone Encounter (Signed)
left voicemail for pt to call us back and schedule appointment

## 2019-03-11 NOTE — Progress Notes (Signed)
Pt received B12 injection in left Deltoid. Tolerated well. Stated that she is scheduled for 2 weeks for her repeat.

## 2019-03-11 NOTE — Telephone Encounter (Signed)
Appointment 10/27 Pt aware

## 2019-03-11 NOTE — Telephone Encounter (Signed)
Pt called wanting her b12 inj today.  She stated she is due.  Can I work her in and put see lugene.  Next nurse visit is 11/11.  Pt stated she gets these every 2 weeks

## 2019-03-11 NOTE — Telephone Encounter (Signed)
[place] Night - Client Nonclinical Telephone Record AccessNurse Client [place] Night - Client Client Site [facility] [place] Physician Renford Dills - MD Contact Type Call Who Is Calling Patient / Member / Family / Caregiver Caller Name [name] Caller Phone Number [phone] Patient Name Maria [name] Patient DOB 30-Mar-1941 Call Type Message Only Information Provided Reason for Call Request to Schedule Office Appointment Initial Comment Caller states that she needs a B-12 shot appointment. Additional Comment Office hours given. She needs the appointment for Tuesday Oct 27th Call Closed By: Vito Backers Transaction Date/Time: 03/10/2019 17:15:49 (ET)

## 2019-03-26 ENCOUNTER — Ambulatory Visit (INDEPENDENT_AMBULATORY_CARE_PROVIDER_SITE_OTHER): Payer: Medicare Other

## 2019-03-26 DIAGNOSIS — E538 Deficiency of other specified B group vitamins: Secondary | ICD-10-CM | POA: Diagnosis not present

## 2019-03-26 MED ORDER — CYANOCOBALAMIN 1000 MCG/ML IJ SOLN
1000.0000 ug | Freq: Once | INTRAMUSCULAR | Status: AC
  Administered 2019-03-26: 1000 ug via INTRAMUSCULAR

## 2019-03-26 NOTE — Progress Notes (Signed)
Per orders of Dr. Leonard Downing signed by Alma Friendly, in his absence injection of B12-getting this every 2 weeks right now (Right deltoid) given by Kris Mouton. Patient tolerated injection well.

## 2019-03-31 ENCOUNTER — Encounter (INDEPENDENT_AMBULATORY_CARE_PROVIDER_SITE_OTHER): Payer: Self-pay

## 2019-03-31 ENCOUNTER — Other Ambulatory Visit: Payer: Medicare Other | Admitting: *Deleted

## 2019-03-31 ENCOUNTER — Other Ambulatory Visit: Payer: Self-pay

## 2019-03-31 DIAGNOSIS — E782 Mixed hyperlipidemia: Secondary | ICD-10-CM

## 2019-03-31 LAB — LIPID PANEL
Chol/HDL Ratio: 3.2 ratio (ref 0.0–4.4)
Cholesterol, Total: 188 mg/dL (ref 100–199)
HDL: 59 mg/dL (ref >39)
LDL Chol Calc (NIH): 115 mg/dL — ABNORMAL HIGH (ref 0–99)
Triglycerides: 74 mg/dL (ref 0–149)
VLDL Cholesterol Cal: 14 mg/dL (ref 5–40)

## 2019-04-01 ENCOUNTER — Telehealth: Payer: Self-pay | Admitting: Pharmacist

## 2019-04-01 MED ORDER — NEXLIZET 180-10 MG PO TABS: 1.0000 | ORAL_TABLET | Freq: Every day | ORAL | 11 refills | Status: DC

## 2019-04-01 NOTE — Telephone Encounter (Signed)
Called pharmacy, copay is $48.30 for 1 month supply (some applied towards deductible). Left message for pt to see if she is ok with this copay or if we need to apply for pt assistance through [facility].

## 2019-04-01 NOTE — Telephone Encounter (Signed)
LDL improved slightly from 122 to 115 since increasing Zetia frequency from every other day to daily. She did not want to add new therapy at time of last lipid panel.   Her LDL goal is < 70 due to history of CAD s/p PCI, DM, and angina.  She's previously intolerant to pravastatin 20mg  every other day, rosuvastatin 5mg  every other day, Vytorin, Repatha (hoarse voice), and Praluent (myalgias).  Called pt to again discuss starting Nexletol or changing her Zetia to Nexlizet combination pill. She is agreeable to changing to [employer] - uses [facility]. Prior authorization submitted and approved through 09/29/19. Rx sent to pharmacy to determine if affordable.

## 2019-04-02 NOTE — Telephone Encounter (Signed)
Spoke with pt - she is ok with copay and will pick up Nexlizet later today. She will call with any issues tolerating therapy. Re-emphasized that she should stop taking her ezetimibe.

## 2019-04-04 NOTE — Progress Notes (Signed)
Date:  04/15/2019   ID:  Maria [name], DOB 11-13-1940, MRN [id number]  Provider Location: Office  PCP:  Joaquim Namuncan, Graham S, MD  Cardiologist:  Charlton HawsPeter Braylinn Gulden, MD   Electrophysiologist:  None   Evaluation Performed:  Follow-Up Visit  Chief Complaint:  CAD  History of Present Illness:    Maria [name] is a 78 y.o. female with CAD. DES to circumflex 2016. History of HTN, DM, GERD, HLD intolerant to statins Dyspnea with Repatha. Also hisotry of SVT ablation in 2016 Last seen by PA 01/17/18 for preoperative clearance Needed back surgery Myovue done 01/21/18 normal EF 72%  Seen in ER 10/03/18 with "syncope"  She was at Dr Verlee RossettiElsner's office to get spinal injection and had not eaten with lots of back and left leg pain Passed out while going to bathroom BS 61  but she did not eat and had taken her glipizide She was given D50 and saline for ? orthostasis No cardiac issues noted   Seeing neurology for neuropathy next week Started in September after back surgery Elavil helps her rest   03/31/19 HLD Rx changed to Nexlizet LDL not at goal 115 prior to this on 03/31/19   Had back surgery in September with poor balance and neuropathy since then   The patient does not have symptoms concerning for COVID-19 infection (fever, chills, cough, or new shortness of breath).    Past Medical History:  Diagnosis Date  . Anemia 05/1985  . Carotid artery disease (HCC)    a. 02/2015 - 1-39% bilaterally.  . Chronic back pain   . Concussion   . Coronary artery disease    [facility]  . DDD (degenerative disc disease), cervical   . DDD (degenerative disc disease), lumbar   . Fracture of lower leg 07/2001   Right  . GERD (gastroesophageal reflux disease)   . Hyperlipidemia   . Hypertension   . Multinodular thyroid   . Pneumonia ?1990's X 1  . PSVT (paroxysmal supraventricular tachycardia) (HCC)    a. s/p catheter ablation of a parahisian atrial tachycardia which was successfully ablated from  the non-coronary cusp of the aortic root 06/2014.  [name]. Renal cyst 07/10/2006   bilateral  . Right knee pain 02/2010   Injected - Dr. Shelle IronBeane  . Sinus pause    a. By event monitoring 09/2014 - BB stopped at that time.  . Syncope and collapse 07/2001   "that's when I broke my leg"  . Type II diabetes mellitus (HCC) 2002   Past Surgical History:  Procedure Laterality Date  . ANTERIOR CERVICAL DECOMP/DISCECTOMY FUSION  10/03/2005   C4/5; C5/6  "it's got a plate in there"  . ANTERIOR LAT LUMBAR FUSION Left 02/04/2018   Procedure: Lumbar two-three Lumbar three-four Lumbar four-five  Anterolateral decompression/percutaneous posterior arthrodesis, Mazor;  Surgeon: Barnett AbuElsner, Henry, MD;  Location: [facility] OR;  Service: Neurosurgery;  Laterality: Left;  . APPLICATION OF ROBOTIC ASSISTANCE FOR SPINAL PROCEDURE N/A 02/04/2018   Procedure: APPLICATION OF ROBOTIC ASSISTANCE FOR SPINAL PROCEDURE;  Surgeon: Barnett AbuElsner, Henry, MD;  Location: [facility] OR;  Service: Neurosurgery;  Laterality: N/A;  . BACK SURGERY    . BREAST BIOPSY Bilateral 1968 (multiple)   "all benign"  . BREAST LUMPECTOMY Bilateral 1968  . CARDIAC CATHETERIZATION N/A 11/18/2014   Procedure: Left Heart Cath and Coronary Angiography;  Surgeon: Nadyne Gariepy M SwazilandJordan, MD;  [facility];  Service: Cardiovascular;  Laterality: N/A;  . CARDIAC CATHETERIZATION N/A 01/22/2015  Procedure: Coronary Stent Intervention;  Surgeon: Cheyna Retana M Martinique, MD;  Location: [facility] CV LAB;  Service: Cardiovascular;  Laterality: N/A;  . CARDIAC CATHETERIZATION N/A 06/10/2015   Procedure: Left Heart Cath and Coronary Angiography;  Surgeon: Charlie Seda M Martinique, MD;  Location: [facility] CV LAB;  Service: Cardiovascular;  Laterality: N/A;  . CATARACT EXTRACTION W/ INTRAOCULAR LENS  IMPLANT, BILATERAL Bilateral 12/2010  . CORONARY ANGIOPLASTY    . ESOPHAGOGASTRODUODENOSCOPY  08/14/2005   gastropathy biopsy, negative  . LAPAROSCOPIC CHOLECYSTECTOMY  08/1992  . LUMBAR PERCUTANEOUS  PEDICLE SCREW 3 LEVEL N/A 02/04/2018   Procedure: LUMBAR PERCUTANEOUS PEDICLE SCREW LUMBAR TWO - LUMBAR FIVE;  Surgeon: Kristeen Miss, MD;  Location: [facility];  Service: Neurosurgery;  Laterality: N/A;  . POSTERIOR LAMINECTOMY / DECOMPRESSION LUMBAR SPINE  09/2001   due to herniated disc  . SUPRAVENTRICULAR TACHYCARDIA ABLATION N/A 07/06/2014   Procedure: SUPRAVENTRICULAR TACHYCARDIA ABLATION;  Surgeon: Evans Lance, MD;  Location: [facility] CATH LAB;  Service: Cardiovascular;  Laterality: N/A;  . VAGINAL HYSTERECTOMY  1968   spotting     No outpatient medications have been marked as taking for the 04/15/19 encounter (Office Visit) with Josue Hector, MD.     Allergies:   Crestor [rosuvastatin], Ezetimibe-simvastatin, Pravastatin, Lyrica [pregabalin], Nsaids, Praluent [alirocumab], Repatha [evolocumab], Acetaminophen, Carvedilol, Gabapentin, Glipizide, Ibuprofen, Insulin glargine, Levemir [insulin detemir], Macrobid [nitrofurantoin macrocrystal], Metformin, Metoprolol tartrate, and Naproxen   Social History   Tobacco Use  . Smoking status: Never Smoker  . Smokeless tobacco: Never Used  Substance Use Topics  . Alcohol use: No    Alcohol/week: 0.0 standard drinks  . Drug use: No     Family Hx: The patient's family history includes Breast cancer in her maternal aunt; Depression in her paternal aunt; Diabetes in her brother, maternal grandmother, paternal grandfather, paternal grandmother, and sister; Heart disease in her maternal grandfather and mother; Hypertension in her maternal grandfather, maternal grandmother, mother, paternal grandfather, and paternal grandmother; Myasthenia gravis in her sister; Stroke in her maternal grandmother. There is no history of Colon cancer.  ROS:   Please see the history of present illness.     All other systems reviewed and are negative.   Prior CV studies:   The following studies were reviewed today:  Myovue 9/919  Venous Duplex No DVT 03/20/18     Labs/Other Tests and Data Reviewed:    EKG:   NSR no acute changes 10/07/18 chronic RBBB /LAFB   Recent Labs: 06/13/2018: TSH 2.13 10/03/2018: Hemoglobin 11.0; Platelets 295 01/02/2019: ALT 10; BUN 16; Creatinine, Ser 0.98; Potassium 5.1; Sodium 141   Recent Lipid Panel Lab Results  Component Value Date/Time   CHOL 188 03/31/2019 08:32 AM   TRIG 74 03/31/2019 08:32 AM   HDL 59 03/31/2019 08:32 AM   CHOLHDL 3.2 03/31/2019 08:32 AM   CHOLHDL 3 01/02/2019 10:28 AM   LDLCALC 115 (H) 03/31/2019 08:32 AM   LDLDIRECT 168.1 02/18/2010 07:51 AM    Wt Readings from Last 3 Encounters:  04/15/19 161 lb (73 kg)  01/09/19 152 lb 6 oz (69.1 kg)  12/18/18 144 lb (65.3 kg)     Objective:    Vital Signs:  BP (!) 148/82   Pulse 87   Ht 5\' 5"  (1.651 m)   Wt 161 lb (73 kg)   SpO2 99%   BMI 26.79 kg/m    Affect appropriate Healthy:  appears stated age HEENT: normal Neck supple with no adenopathy JVP normal no bruits no thyromegaly Lungs  clear with no wheezing and good diaphragmatic motion Heart:  S1/S2 no murmur, no rub, gallop or click PMI normal Abdomen: benighn, BS positve, no tenderness, no AAA no bruit.  No HSM or HJR Distal pulses intact with no bruits No edema Neuro non-focal Skin warm and dry No muscular weakness   ASSESSMENT & PLAN:    1. CAD:  DES to circumflex 2016 normal myovue September 2019 stable 2. Syncope:  Seems vagal related to low BS and pain  3. HLD:  On Nexlizet now f/u labs in mid February  4. DM:  Discussed low carb diet.  Target hemoglobin A1c is 6.5 or less.  Continue current medications. 5. Back:  Chronic pain f/u Elsner post surgery now with poor balance and neuropathy refer to PT/ vestibular For balance issues  6. RBBB/LAFB:  Chronic yearly ECG not related to her "syncope" has had for years   COVID-19 Education: The signs and symptoms of COVID-19 were discussed with the patient and how to seek care for testing (follow up with PCP or arrange  E-visit).  The importance of social distancing was discussed today.  Time:   Today, I have spent 30 minutes with the patient       Medication Adjustments/Labs and Tests Ordered: Current medicines are reviewed at length with the patient today.  Concerns regarding medicines are outlined above.   Tests Ordered:  Lipid and liver in mid February   Medication Changes: No orders of the defined types were placed in this encounter.   Disposition:  Follow up in 6 months  Signed, Charlton Haws, MD  04/15/2019 10:35 AM    [facility]

## 2019-04-09 ENCOUNTER — Ambulatory Visit (INDEPENDENT_AMBULATORY_CARE_PROVIDER_SITE_OTHER): Payer: Medicare Other | Admitting: *Deleted

## 2019-04-09 DIAGNOSIS — E538 Deficiency of other specified B group vitamins: Secondary | ICD-10-CM | POA: Diagnosis not present

## 2019-04-09 MED ORDER — CYANOCOBALAMIN 1000 MCG/ML IJ SOLN
1000.0000 ug | Freq: Once | INTRAMUSCULAR | Status: AC
  Administered 2019-04-09: 1000 ug via INTRAMUSCULAR

## 2019-04-09 NOTE — Progress Notes (Signed)
Per orders of Dr. Gutierrez, in absence of Dr. Duncan,  injection of Vitamin B12  given by Hurley Sobel Simpson. Patient tolerated injection well.  

## 2019-04-14 ENCOUNTER — Telehealth: Payer: Self-pay | Admitting: Cardiovascular Disease

## 2019-04-14 NOTE — Telephone Encounter (Signed)
New message     Called to confirm appt for tomorrow.  Patient want to know if her husband can come with her.  She states that she walks with a cane and need him to help with balance.  Please let her know if he can come up.

## 2019-04-14 NOTE — Telephone Encounter (Signed)
Called patient to let her know visitor policy. Patient thanked the office for her call.

## 2019-04-15 ENCOUNTER — Encounter: Payer: Self-pay | Admitting: Cardiovascular Disease

## 2019-04-15 ENCOUNTER — Other Ambulatory Visit: Payer: Self-pay

## 2019-04-15 ENCOUNTER — Ambulatory Visit (INDEPENDENT_AMBULATORY_CARE_PROVIDER_SITE_OTHER): Payer: Medicare Other | Admitting: Cardiovascular Disease

## 2019-04-15 VITALS — BP 148/82 | HR 87 | Ht 65.0 in | Wt 161.0 lb

## 2019-04-15 DIAGNOSIS — R42 Dizziness and giddiness: Secondary | ICD-10-CM | POA: Diagnosis not present

## 2019-04-15 DIAGNOSIS — R2689 Other abnormalities of gait and mobility: Secondary | ICD-10-CM

## 2019-04-15 DIAGNOSIS — R55 Syncope and collapse: Secondary | ICD-10-CM

## 2019-04-15 NOTE — Patient Instructions (Addendum)
Medication Instructions:   *If you need a refill on your cardiac medications before your next appointment, please call your pharmacy*  Lab Work:  If you have labs (blood work) drawn today and your tests are completely normal, you will receive your results only by: [name] MyChart Message (if you have MyChart) OR . A paper copy in the mail If you have any lab test that is abnormal or we need to change your treatment, we will call you to review the results.  Testing/Procedures: None ordered today.  Follow-Up: At [facility], you and your health needs are our priority.  As part of our continuing mission to provide you with exceptional heart care, we have created designated Provider Care Teams.  These Care Teams include your primary Cardiologist (physician) and Advanced Practice Providers (APPs -  Physician Assistants and Nurse Practitioners) who all work together to provide you with the care you need, when you need it.  Your next appointment:   6 month(s)  The format for your next appointment:   In Person  Provider:   You may see Jenkins Rouge, MD or one of the following Advanced Practice Providers on your designated Care Team:    Truitt Merle, NP  Cecilie Kicks, NP  Kathyrn Drown, NP  You have been referred to vestibular rehabilitation with Rudell Cobb.

## 2019-04-23 ENCOUNTER — Ambulatory Visit: Payer: Medicare Other

## 2019-04-24 ENCOUNTER — Other Ambulatory Visit: Payer: Self-pay

## 2019-04-24 ENCOUNTER — Ambulatory Visit (INDEPENDENT_AMBULATORY_CARE_PROVIDER_SITE_OTHER): Payer: Medicare Other

## 2019-04-24 ENCOUNTER — Other Ambulatory Visit (INDEPENDENT_AMBULATORY_CARE_PROVIDER_SITE_OTHER): Payer: Medicare Other

## 2019-04-24 DIAGNOSIS — E538 Deficiency of other specified B group vitamins: Secondary | ICD-10-CM

## 2019-04-24 DIAGNOSIS — E1159 Type 2 diabetes mellitus with other circulatory complications: Secondary | ICD-10-CM

## 2019-04-24 LAB — VITAMIN B12: Vitamin B-12: 708 pg/mL (ref 211–911)

## 2019-04-24 LAB — HEMOGLOBIN A1C: Hgb A1c MFr Bld: 6.5 % (ref 4.6–6.5)

## 2019-04-24 MED ORDER — CYANOCOBALAMIN 1000 MCG/ML IJ SOLN
1000.0000 ug | Freq: Once | INTRAMUSCULAR | Status: AC
  Administered 2019-04-24: 1000 ug via INTRAMUSCULAR

## 2019-04-24 NOTE — Progress Notes (Signed)
Pt given Every 2 weeks B12 injection in Left Deltoid. Tolerated well. She will schedule her next appointment.

## 2019-04-29 ENCOUNTER — Other Ambulatory Visit: Payer: Self-pay

## 2019-04-29 ENCOUNTER — Ambulatory Visit: Payer: Medicare Other | Admitting: Family Medicine

## 2019-04-29 ENCOUNTER — Encounter: Payer: Self-pay | Admitting: Family Medicine

## 2019-04-29 VITALS — BP 144/74 | HR 109 | Temp 97.4°F | Ht 65.0 in | Wt 158.2 lb

## 2019-04-29 DIAGNOSIS — M792 Neuralgia and neuritis, unspecified: Secondary | ICD-10-CM

## 2019-04-29 DIAGNOSIS — E538 Deficiency of other specified B group vitamins: Secondary | ICD-10-CM | POA: Diagnosis not present

## 2019-04-29 DIAGNOSIS — E1159 Type 2 diabetes mellitus with other circulatory complications: Secondary | ICD-10-CM | POA: Diagnosis not present

## 2019-04-29 MED ORDER — GLYBURIDE 5 MG PO TABS: 2.5000 mg | ORAL_TABLET | Freq: Every day | ORAL | Status: DC

## 2019-04-29 MED ORDER — AMITRIPTYLINE HCL 10 MG PO TABS: 20.0000 mg | ORAL_TABLET | Freq: Every day | ORAL | Status: DC

## 2019-04-29 NOTE — Progress Notes (Signed)
This visit occurred during the SARS-CoV-2 public health emergency.  Safety protocols were in place, including screening questions prior to the visit, additional usage of staff PPE, and extensive cleaning of exam room while observing appropriate contact time as indicated for disinfecting solutions.  Diabetes:  Taking 1/2 glyburide at night if sugar is elevated at night.  Hypoglycemic episodes:no Hyperglycemic episodes:no Feet problems: see below.  Blood Sugars averaging: ~100 in the AMs now eye exam within last year: due, d/w pt.  She is going next month.   A1c controlled.   She is walking with a cane, dealing with neuropathy with pain and change in sensation.  Foot pain is worse than leg pain.  She is sleeping well with amitriptyline.  D/w pt about inc dose from 20 to 25mg  at night.   shingrix d/w pt.  Her voice is a little hoarse.  She had unremarkable ENT eval in the meantime.  Her mother's voice was similar.  Swallowing well.    B12 normal, not high, not low, d/w pt. I would continue tx as is.   PMH and SH reviewed Meds, vitals, and allergies reviewed.   ROS: Per HPI unless specifically indicated in ROS section   GEN: nad, alert and oriented HEENT: ncat NECK: supple w/o LA CV: rrr.  Recheck pulse 90.  PULM: ctab, no inc wob ABD: soft, +bs EXT: no edema SKIN: no acute rash

## 2019-04-29 NOTE — Patient Instructions (Addendum)
Keep taking B12 as is and try a little higher dose of amitriptyline at night.   Take care.  Glad to see you.  Recheck in about 4 months.  We can do labs ahead of time.   Check with your insurance to see if they will cover the shingles shot.

## 2019-05-01 NOTE — Assessment & Plan Note (Signed)
Recheck in about 4 months.  We can do labs ahead of time.  Continue half tab of glyburide at night if her sugar is elevated.  She is doing well otherwise.  A1c controlled.  Discussed with patient.

## 2019-05-01 NOTE — Assessment & Plan Note (Signed)
Recheck in about 4 months.  We can do labs ahead of time.  Continue as is in the meantime.

## 2019-05-01 NOTE — Assessment & Plan Note (Signed)
She is walking with a cane, dealing with neuropathy with pain and change in sensation.  Foot pain is worse than leg pain.  She is sleeping well with amitriptyline.  D/w pt about inc dose from 20 to 25mg  at night.  >25 minutes spent in face to face time with patient, >50% spent in counselling or coordination of care

## 2019-05-15 ENCOUNTER — Ambulatory Visit (INDEPENDENT_AMBULATORY_CARE_PROVIDER_SITE_OTHER): Payer: Medicare Other

## 2019-05-15 ENCOUNTER — Other Ambulatory Visit: Payer: Self-pay

## 2019-05-15 DIAGNOSIS — E538 Deficiency of other specified B group vitamins: Secondary | ICD-10-CM | POA: Diagnosis not present

## 2019-05-15 MED ORDER — CYANOCOBALAMIN 1000 MCG/ML IJ SOLN
1000.0000 ug | Freq: Once | INTRAMUSCULAR | Status: AC
  Administered 2019-05-15: 10:00:00 1000 ug via INTRAMUSCULAR

## 2019-05-15 NOTE — Progress Notes (Signed)
Pt given q 2 week B12 injection in Right Deltoid. Pt tolerated well.

## 2019-06-04 ENCOUNTER — Ambulatory Visit (INDEPENDENT_AMBULATORY_CARE_PROVIDER_SITE_OTHER): Payer: Medicare PPO

## 2019-06-04 ENCOUNTER — Other Ambulatory Visit: Payer: Self-pay

## 2019-06-04 DIAGNOSIS — E538 Deficiency of other specified B group vitamins: Secondary | ICD-10-CM

## 2019-06-04 MED ORDER — CYANOCOBALAMIN 1000 MCG/ML IJ SOLN
1000.0000 ug | Freq: Once | INTRAMUSCULAR | Status: AC
  Administered 2019-06-04: 1000 ug via INTRAMUSCULAR

## 2019-06-04 NOTE — Progress Notes (Signed)
Per orders of Dr. Tower, injection of vit B12 given by Tulsi Crossett. Patient tolerated injection well.  

## 2019-06-09 ENCOUNTER — Other Ambulatory Visit: Payer: Self-pay

## 2019-06-09 ENCOUNTER — Ambulatory Visit: Payer: Medicare PPO | Admitting: Family Medicine

## 2019-06-09 ENCOUNTER — Encounter: Payer: Self-pay | Admitting: Family Medicine

## 2019-06-09 DIAGNOSIS — M792 Neuralgia and neuritis, unspecified: Secondary | ICD-10-CM

## 2019-06-09 MED ORDER — AMITRIPTYLINE HCL 10 MG PO TABS: ORAL_TABLET | ORAL | 12 refills | Status: DC

## 2019-06-09 NOTE — Progress Notes (Signed)
This visit occurred during the SARS-CoV-2 public health emergency.  Safety protocols were in place, including screening questions prior to the visit, additional usage of staff PPE, and extensive cleaning of exam room while observing appropriate contact time as indicated for disinfecting solutions.  Back pain. She has been taking amitriptyline 20mg  qhs.  "I'm in pain all the time except for night after I take amitriptyline."  L thigh pain is worse, shooting pain.  She didn't get help from tramadol.  She has less sensation in the feet, with the neuropathy/pain progressing proximally.  She has the same pain in the R leg but not a pronounced as the L.  Progressive change in the last year.    She has back pain at baseline.  She wants to avoid surgery if at all possible.    She has some shoulder pain at baseline, that isn't changed. This is different from her leg pain.    She is tolerating nexlizet.  Taking B12 IM twice a month.  A1c 6.5 and B12 wnl recently.  She is intolerant to mult meds.    Meds, vitals, and allergies reviewed.   ROS: Per HPI unless specifically indicated in ROS section   nad ncat Neck supple, no LA rrr ctab abd soft, not ttp Dec sens L foot with inc sensitivity with pain on light touch L thigh.  L foot weaker for plantar and dorsiflexion at baseline.  Still able to bear weight.

## 2019-06-09 NOTE — Patient Instructions (Addendum)
Try taking 25mg  amitriptyline at night.  If tolerated and needed, then add on 5mg  amitriptyline in the mornings.  See how that does and let me know.   We may need to get you over to neurology for a second opinion if this isn't helping.  Take care.  Glad to see you.

## 2019-06-11 NOTE — Assessment & Plan Note (Signed)
Worse.  Discussed options.  Would try taking 25mg  amitriptyline at night.  If tolerated and needed, then add on 5mg  amitriptyline in the mornings.  She will see how that does and let me know.   We may need to get her over to neurology for a second opinion if this isn't helping.  D/w pt.  She agrees.

## 2019-06-16 ENCOUNTER — Telehealth: Payer: Self-pay | Admitting: Cardiovascular Disease

## 2019-06-16 NOTE — Telephone Encounter (Signed)
Pt calling stating that her insurance will cover her medication bempedoic Acid-Ezetimibe 180-10 mg tablet, pt stated that she needed a prior auth done. Please address

## 2019-06-16 NOTE — Telephone Encounter (Signed)
This was already previously done. Advised pt she can pick up rx at her pharmacy and that she should bring a copy of her updated insurance. She prefers to call back later to schedule follow up labs as she cannot currently drive due to recent surgery.

## 2019-06-18 ENCOUNTER — Other Ambulatory Visit: Payer: Self-pay

## 2019-06-18 ENCOUNTER — Ambulatory Visit (INDEPENDENT_AMBULATORY_CARE_PROVIDER_SITE_OTHER): Payer: Medicare PPO | Admitting: *Deleted

## 2019-06-18 DIAGNOSIS — E538 Deficiency of other specified B group vitamins: Secondary | ICD-10-CM

## 2019-06-18 MED ORDER — CYANOCOBALAMIN 1000 MCG/ML IJ SOLN
1000.0000 ug | Freq: Once | INTRAMUSCULAR | Status: AC
  Administered 2019-06-18: 1000 ug via INTRAMUSCULAR

## 2019-06-18 NOTE — Progress Notes (Signed)
Per orders of Katherine Clark, NP in Dr. Duncan's absence, injection of Vit B12 given by Jeslie Lowe M. Patient tolerated injection well.  

## 2019-06-23 ENCOUNTER — Ambulatory Visit: Payer: Medicare PPO

## 2019-07-07 ENCOUNTER — Ambulatory Visit: Payer: Medicare PPO

## 2019-07-09 DIAGNOSIS — E119 Type 2 diabetes mellitus without complications: Secondary | ICD-10-CM | POA: Diagnosis not present

## 2019-07-09 DIAGNOSIS — H52203 Unspecified astigmatism, bilateral: Secondary | ICD-10-CM | POA: Diagnosis not present

## 2019-07-09 DIAGNOSIS — H43813 Vitreous degeneration, bilateral: Secondary | ICD-10-CM | POA: Diagnosis not present

## 2019-07-09 DIAGNOSIS — H04123 Dry eye syndrome of bilateral lacrimal glands: Secondary | ICD-10-CM | POA: Diagnosis not present

## 2019-07-09 LAB — HM DIABETES EYE EXAM

## 2019-07-10 ENCOUNTER — Ambulatory Visit: Payer: Medicare PPO | Attending: Internal Medicine

## 2019-07-10 DIAGNOSIS — Z23 Encounter for immunization: Secondary | ICD-10-CM | POA: Insufficient documentation

## 2019-07-10 NOTE — Progress Notes (Signed)
   Covid-19 Vaccination Clinic  Name:  Maria [name]    MRN: [id number] DOB: [birth year]  07/10/2019  Maria [name] was observed post Covid-19 immunization for 15 minutes without incidence. She was provided with Vaccine Information Sheet and instruction to access the V-Safe system.   Maria [name] was instructed to call 911 with any severe reactions post vaccine: [name] Difficulty breathing  . Swelling of your face and throat  . A fast heartbeat  . A bad rash all over your body  . Dizziness and weakness    Immunizations Administered    Name Date Dose VIS Date Route   Pfizer COVID-19 Vaccine 07/10/2019 10:16 AM 0.3 mL 04/25/2019 Intramuscular   Manufacturer: [employer], [employer]   Lot: [id number]   NDC: [address]-3

## 2019-07-23 ENCOUNTER — Ambulatory Visit: Payer: Medicare PPO

## 2019-07-30 ENCOUNTER — Ambulatory Visit: Payer: Medicare PPO | Attending: Internal Medicine

## 2019-07-30 DIAGNOSIS — Z23 Encounter for immunization: Secondary | ICD-10-CM

## 2019-07-30 NOTE — Progress Notes (Signed)
   Covid-19 Vaccination Clinic  Name:  Maria [name]    MRN: [id number] DOB: June 12, 1940  07/30/2019  [name] was observed post Covid-19 immunization for 15 minutes without incident. She was provided with Vaccine Information Sheet and instruction to access the V-Safe system.   [name] was instructed to call 911 with any severe reactions post vaccine: [name] Difficulty breathing  . Swelling of face and throat  . A fast heartbeat  . A bad rash all over body  . Dizziness and weakness   Immunizations Administered    Name Date Dose VIS Date Route   Pfizer COVID-19 Vaccine 07/30/2019 12:04 PM 0.3 mL 04/25/2019 Intramuscular   Manufacturer: [employer], [employer]   Lot: [id number]   NDC: [address]-0

## 2019-08-13 DIAGNOSIS — N281 Cyst of kidney, acquired: Secondary | ICD-10-CM | POA: Diagnosis not present

## 2019-08-14 ENCOUNTER — Other Ambulatory Visit: Payer: Medicare PPO

## 2019-08-14 ENCOUNTER — Other Ambulatory Visit: Payer: Self-pay

## 2019-08-14 ENCOUNTER — Other Ambulatory Visit (INDEPENDENT_AMBULATORY_CARE_PROVIDER_SITE_OTHER): Payer: Medicare PPO

## 2019-08-14 ENCOUNTER — Ambulatory Visit (INDEPENDENT_AMBULATORY_CARE_PROVIDER_SITE_OTHER): Payer: Medicare PPO

## 2019-08-14 DIAGNOSIS — E538 Deficiency of other specified B group vitamins: Secondary | ICD-10-CM

## 2019-08-14 DIAGNOSIS — Z0000 Encounter for general adult medical examination without abnormal findings: Secondary | ICD-10-CM

## 2019-08-14 DIAGNOSIS — E1159 Type 2 diabetes mellitus with other circulatory complications: Secondary | ICD-10-CM | POA: Diagnosis not present

## 2019-08-14 MED ORDER — CYANOCOBALAMIN 1000 MCG/ML IJ SOLN
1000.0000 ug | Freq: Once | INTRAMUSCULAR | Status: AC
  Administered 2019-08-14: 14:00:00 1000 ug via INTRAMUSCULAR

## 2019-08-14 NOTE — Progress Notes (Signed)
Per orders of Dr. Duncan, injection of vit B12 given by Nakota Ackert. Patient tolerated injection well.  

## 2019-08-14 NOTE — Addendum Note (Signed)
Addended by: Aquilla Solian on: 08/14/2019 02:10 PM   Modules accepted: Orders

## 2019-08-14 NOTE — Patient Instructions (Signed)
Maria [name] , Thank you for taking time to come for your Medicare Wellness Visit. I appreciate your ongoing commitment to your health goals. Please review the following plan we discussed and let me know if I can assist you in the future.   Screening recommendations/referrals: Colonoscopy: declined Mammogram: declined Bone Density: declined Recommended yearly ophthalmology/optometry visit for glaucoma screening and checkup Recommended yearly dental visit for hygiene and checkup  Vaccinations: Influenza vaccine: Up to date, completed 01/09/2019 Pneumococcal vaccine: Completed series Tdap vaccine: Up to date, completed 02/22/2017 Shingles vaccine: discussed    Advanced directives: Please bring a copy of your POA (Power of [place]) and/or Living Will to your next appointment.   Conditions/risks identified: diabetes, hypertension, hyperlipidemia  Next appointment: 08/22/2019 @ 9:45 am    Preventive Care 80 Years and Older, Female Preventive care refers to lifestyle choices and visits with your health care provider that can promote health and wellness. What does preventive care include?  A yearly physical exam. This is also called an annual well check.  Dental exams once or twice a year.  Routine eye exams. Ask your health care provider how often you should have your eyes checked.  Personal lifestyle choices, including:  Daily care of your teeth and gums.  Regular physical activity.  Eating a healthy diet.  Avoiding tobacco and drug use.  Limiting alcohol use.  Practicing safe sex.  Taking low-dose aspirin every day.  Taking vitamin and mineral supplements as recommended by your health care provider. What happens during an annual well check? The services and screenings done by your health care provider during your annual well check will depend on your age, overall health, lifestyle risk factors, and family history of disease. Counseling  Your health care provider may ask you  questions about your:  Alcohol use.  Tobacco use.  Drug use.  Emotional well-being.  Home and relationship well-being.  Sexual activity.  Eating habits.  History of falls.  Memory and ability to understand (cognition).  Work and work Astronomer.  Reproductive health. Screening  You may have the following tests or measurements:  Height, weight, and BMI.  Blood pressure.  Lipid and cholesterol levels. These may be checked every 5 years, or more frequently if you are over 10 years old.  Skin check.  Lung cancer screening. You may have this screening every year starting at age 80 if you have a 30-pack-year history of smoking and currently smoke or have quit within the past 15 years.  Fecal occult blood test (FOBT) of the stool. You may have this test every year starting at age 80.  Flexible sigmoidoscopy or colonoscopy. You may have a sigmoidoscopy every 5 years or a colonoscopy every 10 years starting at age 80.  Hepatitis C blood test.  Hepatitis B blood test.  Sexually transmitted disease (STD) testing.  Diabetes screening. This is done by checking your blood sugar (glucose) after you have not eaten for a while (fasting). You may have this done every 1-3 years.  Bone density scan. This is done to screen for osteoporosis. You may have this done starting at age 80.  Mammogram. This may be done every 1-2 years. Talk to your health care provider about how often you should have regular mammograms. Talk with your health care provider about your test results, treatment options, and if necessary, the need for more tests. Vaccines  Your health care provider may recommend certain vaccines, such as:  Influenza vaccine. This is recommended every year.  Tetanus, diphtheria,  and acellular pertussis (Tdap, Td) vaccine. You may need a Td booster every 10 years.  Zoster vaccine. You may need this after age 80.  Pneumococcal 13-valent conjugate (PCV13) vaccine. One dose is  recommended after age 80.  Pneumococcal polysaccharide (PPSV23) vaccine. One dose is recommended after age 80. Talk to your health care provider about which screenings and vaccines you need and how often you need them. This information is not intended to replace advice given to you by your health care provider. Make sure you discuss any questions you have with your health care provider. Document Released: 05/28/2015 Document Revised: 01/19/2016 Document Reviewed: 03/02/2015 Elsevier Interactive Patient Education  2017 [place] Prevention in the Home Falls can cause injuries. They can happen to people of all ages. There are many things you can do to make your home safe and to help prevent falls. What can I do on the outside of my home?  Regularly fix the edges of walkways and driveways and fix any cracks.  Remove anything that might make you trip as you walk through a door, such as a raised step or threshold.  Trim any bushes or trees on the path to your home.  Use bright outdoor lighting.  Clear any walking paths of anything that might make someone trip, such as rocks or tools.  Regularly check to see if handrails are loose or broken. Make sure that both sides of any steps have handrails.  Any raised decks and porches should have guardrails on the edges.  Have any leaves, snow, or ice cleared regularly.  Use sand or salt on walking paths during winter.  Clean up any spills in your garage right away. This includes oil or grease spills. What can I do in the bathroom?  Use night lights.  Install grab bars by the toilet and in the tub and shower. Do not use towel bars as grab bars.  Use non-skid mats or decals in the tub or shower.  If you need to sit down in the shower, use a plastic, non-slip stool.  Keep the floor dry. Clean up any water that spills on the floor as soon as it happens.  Remove soap buildup in the tub or shower regularly.  Attach bath mats  securely with double-sided non-slip rug tape.  Do not have throw rugs and other things on the floor that can make you trip. What can I do in the bedroom?  Use night lights.  Make sure that you have a light by your bed that is easy to reach.  Do not use any sheets or blankets that are too big for your bed. They should not hang down onto the floor.  Have a firm chair that has side arms. You can use this for support while you get dressed.  Do not have throw rugs and other things on the floor that can make you trip. What can I do in the kitchen?  Clean up any spills right away.  Avoid walking on wet floors.  Keep items that you use a lot in easy-to-reach places.  If you need to reach something above you, use a strong step stool that has a grab bar.  Keep electrical cords out of the way.  Do not use floor polish or wax that makes floors slippery. If you must use wax, use non-skid floor wax.  Do not have throw rugs and other things on the floor that can make you trip. What can I do with my  stairs?  Do not leave any items on the stairs.  Make sure that there are handrails on both sides of the stairs and use them. Fix handrails that are broken or loose. Make sure that handrails are as long as the stairways.  Check any carpeting to make sure that it is firmly attached to the stairs. Fix any carpet that is loose or worn.  Avoid having throw rugs at the top or bottom of the stairs. If you do have throw rugs, attach them to the floor with carpet tape.  Make sure that you have a light switch at the top of the stairs and the bottom of the stairs. If you do not have them, ask someone to add them for you. What else can I do to help prevent falls?  Wear shoes that:  Do not have high heels.  Have rubber bottoms.  Are comfortable and fit you well.  Are closed at the toe. Do not wear sandals.  If you use a stepladder:  Make sure that it is fully opened. Do not climb a closed  stepladder.  Make sure that both sides of the stepladder are locked into place.  Ask someone to hold it for you, if possible.  Clearly mark and make sure that you can see:  Any grab bars or handrails.  First and last steps.  Where the edge of each step is.  Use tools that help you move around (mobility aids) if they are needed. These include:  Canes.  Walkers.  Scooters.  Crutches.  Turn on the lights when you go into a [place] area. Replace any light bulbs as soon as they burn out.  Set up your furniture so you have a clear path. Avoid moving your furniture around.  If any of your floors are uneven, fix them.  If there are any pets around you, be aware of where they are.  Review your medicines with your doctor. Some medicines can make you feel dizzy. This can increase your chance of falling. Ask your doctor what other things that you can do to help prevent falls. This information is not intended to replace advice given to you by your health care provider. Make sure you discuss any questions you have with your health care provider. Document Released: 02/25/2009 Document Revised: 10/07/2015 Document Reviewed: 06/05/2014 Elsevier Interactive Patient Education  2017 [employer].

## 2019-08-14 NOTE — Progress Notes (Signed)
PCP notes:  Health Maintenance: Colonoscopy- declined Mammogram- declined Dexa- declined   Abnormal Screenings: none   Patient concerns: Discuss neuropathy pain    Nurse concerns: none   Next PCP appt.: 08/22/2019 @ 9:45 am

## 2019-08-14 NOTE — Addendum Note (Signed)
Addended by: Chaska Hagger L on: 08/14/2019 02:10 PM   Modules accepted: Orders  

## 2019-08-14 NOTE — Addendum Note (Signed)
Addended by: Jalicia Roszak L on: 08/14/2019 02:10 PM   Modules accepted: Orders  
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
## 2019-08-15 LAB — COMPREHENSIVE METABOLIC PANEL
AG Ratio: 1.8 (calc) (ref 1.0–2.5)
ALT: 11 U/L (ref 6–29)
AST: 17 U/L (ref 10–35)
Albumin: 4.4 g/dL (ref 3.6–5.1)
Alkaline phosphatase (APISO): 87 U/L (ref 37–153)
BUN/Creatinine Ratio: 17 (calc) (ref 6–22)
BUN: 19 mg/dL (ref 7–25)
CO2: 26 mmol/L (ref 20–32)
Calcium: 10.1 mg/dL (ref 8.6–10.4)
Chloride: 106 mmol/L (ref 98–110)
Creat: 1.14 mg/dL — ABNORMAL HIGH (ref 0.60–0.88)
Globulin: 2.4 g/dL (calc) (ref 1.9–3.7)
Glucose, Bld: 107 mg/dL — ABNORMAL HIGH (ref 65–99)
Potassium: 4.4 mmol/L (ref 3.5–5.3)
Sodium: 142 mmol/L (ref 135–146)
Total Bilirubin: 0.6 mg/dL (ref 0.2–1.2)
Total Protein: 6.8 g/dL (ref 6.1–8.1)

## 2019-08-15 LAB — LIPID PANEL
Cholesterol: 143 mg/dL (ref <200)
HDL: 42 mg/dL — ABNORMAL LOW (ref >=50)
LDL Cholesterol (Calc): 81 mg/dL (calc)
Non-HDL Cholesterol (Calc): 101 mg/dL (calc) (ref <130)
Total CHOL/HDL Ratio: 3.4 (calc) (ref <5.0)
Triglycerides: 108 mg/dL (ref <150)

## 2019-08-15 LAB — HEMOGLOBIN A1C
Hgb A1c MFr Bld: 5.9 % of total Hgb — ABNORMAL HIGH (ref <5.7)
Mean Plasma Glucose: 123 (calc)
eAG (mmol/L): 6.8 (calc)

## 2019-08-15 LAB — VITAMIN B12: Vitamin B-12: 542 pg/mL (ref 200–1100)

## 2019-08-22 ENCOUNTER — Encounter: Payer: Self-pay | Admitting: Family Medicine

## 2019-08-22 ENCOUNTER — Other Ambulatory Visit: Payer: Self-pay

## 2019-08-22 ENCOUNTER — Ambulatory Visit: Payer: Medicare PPO | Admitting: Family Medicine

## 2019-08-22 VITALS — BP 138/82 | HR 67 | Temp 97.3°F | Ht 65.0 in | Wt 162.1 lb

## 2019-08-22 DIAGNOSIS — E782 Mixed hyperlipidemia: Secondary | ICD-10-CM

## 2019-08-22 DIAGNOSIS — Z7189 Other specified counseling: Secondary | ICD-10-CM

## 2019-08-22 DIAGNOSIS — M792 Neuralgia and neuritis, unspecified: Secondary | ICD-10-CM

## 2019-08-22 DIAGNOSIS — N281 Cyst of kidney, acquired: Secondary | ICD-10-CM

## 2019-08-22 DIAGNOSIS — Z0000 Encounter for general adult medical examination without abnormal findings: Secondary | ICD-10-CM

## 2019-08-22 DIAGNOSIS — E1159 Type 2 diabetes mellitus with other circulatory complications: Secondary | ICD-10-CM

## 2019-08-22 MED ORDER — AMITRIPTYLINE HCL 10 MG PO TABS: ORAL_TABLET | ORAL | Status: DC

## 2019-08-22 NOTE — Progress Notes (Signed)
This visit occurred during the SARS-CoV-2 public health emergency.  Safety protocols were in place, including screening questions prior to the visit, additional usage of staff PPE, and extensive cleaning of exam room while observing appropriate contact time as indicated for disinfecting solutions.  Diabetes:  Using medications without difficulties: taking glyburide at night.   Hypoglycemic episodes: no Hyperglycemic episodes: no Feet problems: still with neuropathy pain at baseline.  Blood Sugars averaging: usually ~140 eye exam within last year: yes Labs d/w pt.  A1c at goal.   She still in pain and amitriptyline helps but she is only taking it at night.  She is having pain in the day.  Still walking with a cane. She has pain radiating down from the R back to the R hip and leg, in the flank.  She has seen Dr. Danielle Dess and is going to see urology in the near future.  Known renal cyst.  She has B leg neuropathy at baseline.  Neuropathy pain is better with amitriptyline but R sided back pain isn't better with amitriptyline use.  She tried taking 1/2 tab amitriptyline in the AM but it didn't help.  Is taking 20mg  at night.  Limited relief from tramadol.  She has tried to limit use of ibuprofen.    Elevated Cholesterol: Using medications without problems: yes Muscle aches: not from current med.  Diet compliance: encouraged.  Exercise: limited by pain.   Tolerating nexlizet w/o aches.    covid 2021.  Tetanus 2018 zostavax prev Flu yearly  PNA up to date.  Daughter designated if patient were incapacitated.  Then if needed, son designated.  Then if needed, husband designated.   Mammogram d/w pt.   See avs.   She wanted to defer DXA at this point.   Defer colon CA screening, d/w pt.    PMH and SH reviewed  Meds, vitals, and allergies reviewed.   ROS: Per HPI unless specifically indicated in ROS section   GEN: nad, alert and oriented HEENT: ncat NECK: supple w/o LA CV: rrr. PULM: ctab,  no inc wob ABD: soft, +bs EXT: no edema SKIN: no acute rash  Diabetic foot exam: Normal inspection No skin breakdown No calluses  Normal DP pulses Normal sensation to light touch on the bilateral feet but decreased sensation to monofilament on the left foot.  Sensation intact on the right foot. Nails normal

## 2019-08-22 NOTE — Patient Instructions (Addendum)
Please call about a mammogram.  Use the least amount of ibuprofen possible, with food.   Recheck A1c at a visit in about 6 months.  Update me and stop glyburide if you have any lows.   We'll set up the ultrasound.   Let me check on seeing Dr. Murray Hodgkins for options for pain.  Take care.  Glad to see you.

## 2019-08-24 ENCOUNTER — Telehealth: Payer: Self-pay | Admitting: Family Medicine

## 2019-08-24 DIAGNOSIS — Z0000 Encounter for general adult medical examination without abnormal findings: Secondary | ICD-10-CM | POA: Insufficient documentation

## 2019-08-24 DIAGNOSIS — Z7189 Other specified counseling: Secondary | ICD-10-CM | POA: Insufficient documentation

## 2019-08-24 NOTE — Assessment & Plan Note (Signed)
A1c at goal.  Stop glyburide if any lows.  Recheck periodically.  See after visit summary.  She agrees.

## 2019-08-24 NOTE — Assessment & Plan Note (Signed)
Several issues here.  She can increase amitriptyline to 30 mg at night and see if that helps.  We will ask for input from Dr. Leo Rod. Murray Hodgkins.  Also unclear if she is having any pain is related to the renal cyst and we can recheck a renal ultrasound.  Ordered.  She has urology follow-up pending.  I do not think her pain is statin related.  Labs discussed with patient.

## 2019-08-24 NOTE — Assessment & Plan Note (Signed)
covid 2021.  Tetanus 2018 zostavax prev Flu yearly  PNA up to date.  Daughter designated if patient were incapacitated.  Then if needed, son designated.  Then if needed, husband designated.   Mammogram d/w pt.   See avs.   She wanted to defer DXA at this point.   Defer colon CA screening, d/w pt.

## 2019-08-24 NOTE — Telephone Encounter (Signed)
Please check with Dr. Danielle Dess, send him a message or I can talk with him if that would be more useful.  I need his advice.  Even if the patient no longer has a surgical issue with her back, I question if she is still having some type of neuropathic pain that could potentially be addressed through Dr. Chari Manning clinic.  I would like input from Dr. Danielle Dess and from Dr. Murray Hodgkins.  In the meantime we are working up her renal cyst with a recheck ultrasound.  I thank all involved.

## 2019-08-24 NOTE — Assessment & Plan Note (Signed)
Tolerating nexlizet w/o aches.   Would continue as is.  She agrees.

## 2019-08-24 NOTE — Assessment & Plan Note (Signed)
Daughter designated if patient were incapacitated.  Then if needed, son designated.  Then if needed, husband designated.

## 2019-08-25 ENCOUNTER — Encounter: Payer: Self-pay | Admitting: Family Medicine

## 2019-08-25 NOTE — Progress Notes (Signed)
[facility]/thx dmf 

## 2019-08-26 ENCOUNTER — Ambulatory Visit
Admission: RE | Admit: 2019-08-26 | Discharge: 2019-08-26 | Disposition: A | Discharge status: Home / Self Care | Payer: Medicare PPO | Source: Ambulatory Visit | Attending: Family Medicine | Admitting: Family Medicine

## 2019-08-26 DIAGNOSIS — N281 Cyst of kidney, acquired: Secondary | ICD-10-CM | POA: Diagnosis not present

## 2019-09-15 DIAGNOSIS — N281 Cyst of kidney, acquired: Secondary | ICD-10-CM | POA: Diagnosis not present

## 2019-09-15 DIAGNOSIS — M546 Pain in thoracic spine: Secondary | ICD-10-CM | POA: Diagnosis not present

## 2019-09-18 ENCOUNTER — Telehealth: Payer: Self-pay | Admitting: Family Medicine

## 2019-09-18 DIAGNOSIS — G629 Polyneuropathy, unspecified: Secondary | ICD-10-CM

## 2019-09-18 NOTE — Telephone Encounter (Signed)
Patient called to get a referral to Dr.Patel or Dr.Tat or whoever is available at [facility]. Patient saw someone at [facility]. Patient said she doesn't want to go back there.  Patient said she discussed this with Dr.Duncan at her last office visit. Patient can go anytime.  Patient said she's having a terrible time with neuropathy and would like the appointment as soon as possible.

## 2019-09-19 ENCOUNTER — Encounter: Payer: Self-pay | Admitting: Neurology

## 2019-09-19 NOTE — Telephone Encounter (Signed)
I put in the referral.  Thanks.  

## 2019-09-21 ENCOUNTER — Emergency Department (HOSPITAL_COMMUNITY): Payer: Medicare PPO

## 2019-09-21 ENCOUNTER — Telehealth: Payer: Self-pay

## 2019-09-21 ENCOUNTER — Emergency Department (HOSPITAL_COMMUNITY)
Admission: EM | Admit: 2019-09-21 | Discharge: 2019-09-21 | Disposition: A | Discharge status: Home / Self Care | Payer: Medicare PPO | Attending: Emergency Medicine | Admitting: Emergency Medicine

## 2019-09-21 DIAGNOSIS — W01198A Fall on same level from slipping, tripping and stumbling with subsequent striking against other object, initial encounter: Secondary | ICD-10-CM | POA: Insufficient documentation

## 2019-09-21 DIAGNOSIS — W19XXXA Unspecified fall, initial encounter: Secondary | ICD-10-CM

## 2019-09-21 DIAGNOSIS — I10 Essential (primary) hypertension: Secondary | ICD-10-CM | POA: Insufficient documentation

## 2019-09-21 DIAGNOSIS — Z79899 Other long term (current) drug therapy: Secondary | ICD-10-CM | POA: Diagnosis not present

## 2019-09-21 DIAGNOSIS — I2510 Atherosclerotic heart disease of native coronary artery without angina pectoris: Secondary | ICD-10-CM | POA: Diagnosis not present

## 2019-09-21 DIAGNOSIS — Y999 Unspecified external cause status: Secondary | ICD-10-CM | POA: Diagnosis not present

## 2019-09-21 DIAGNOSIS — S61411A Laceration without foreign body of right hand, initial encounter: Secondary | ICD-10-CM | POA: Diagnosis not present

## 2019-09-21 DIAGNOSIS — M6281 Muscle weakness (generalized): Secondary | ICD-10-CM | POA: Diagnosis not present

## 2019-09-21 DIAGNOSIS — Y9389 Activity, other specified: Secondary | ICD-10-CM | POA: Insufficient documentation

## 2019-09-21 DIAGNOSIS — S0990XA Unspecified injury of head, initial encounter: Secondary | ICD-10-CM | POA: Diagnosis not present

## 2019-09-21 DIAGNOSIS — S199XXA Unspecified injury of neck, initial encounter: Secondary | ICD-10-CM | POA: Diagnosis not present

## 2019-09-21 DIAGNOSIS — E119 Type 2 diabetes mellitus without complications: Secondary | ICD-10-CM | POA: Diagnosis not present

## 2019-09-21 DIAGNOSIS — R42 Dizziness and giddiness: Secondary | ICD-10-CM | POA: Insufficient documentation

## 2019-09-21 DIAGNOSIS — Z7982 Long term (current) use of aspirin: Secondary | ICD-10-CM | POA: Insufficient documentation

## 2019-09-21 DIAGNOSIS — R52 Pain, unspecified: Secondary | ICD-10-CM | POA: Diagnosis not present

## 2019-09-21 DIAGNOSIS — M79643 Pain in unspecified hand: Secondary | ICD-10-CM | POA: Diagnosis not present

## 2019-09-21 DIAGNOSIS — Z7984 Long term (current) use of oral hypoglycemic drugs: Secondary | ICD-10-CM | POA: Insufficient documentation

## 2019-09-21 DIAGNOSIS — M542 Cervicalgia: Secondary | ICD-10-CM | POA: Diagnosis not present

## 2019-09-21 DIAGNOSIS — Y929 Unspecified place or not applicable: Secondary | ICD-10-CM | POA: Diagnosis not present

## 2019-09-21 NOTE — ED Notes (Signed)
Pt alert and oriented, able to get dressed with husband assist.  Ready for DC.

## 2019-09-21 NOTE — TOC Transition Note (Signed)
Transition of Care [facility]) - CM/SW Discharge Note   Patient Details  Name: Maria [name] MRN: [id number] Date of Birth: [birth year]  Transition of Care [facility]) CM/SW Contact:  Lockie Pares, RN Phone Number: 09/21/2019, 6:08 PM   Clinical Narrative:     [facility] and offer PT and OT.  In the home. Patient  Will be made aware of the company by phone.   Final next level of care: Home w Home Health Services Barriers to Discharge: No Barriers Identified   Patient Goals and CMS Choice Patient states their goals for this hospitalization and ongoing recovery are:: To go home with equipment to keep me from falling      Discharge Placement                       Discharge Plan and Services                DME Arranged: Dan Humphreys DME Agency: [employer] Date DME Agency Contacted: 09/21/19 Time DME Agency Contacted: [phone] Representative spoke with at DME Agency: Ledell Noss HH Arranged: PT, OT HH Agency: [facility] Date [facility] Agency Contacted: 09/21/19 Time HH Agency Contacted: 1800 Representative spoke with at [facility] Agency: Denyse Amass  Social Determinants of Health (SDOH) Interventions     Readmission Risk Interventions No flowsheet data found.

## 2019-09-21 NOTE — ED Notes (Signed)
Husband reports pt has hx of frequent falls, 21 of 6 years.  Pt reports that she started Valium for r/o kidney vs back problems after surgery.  Does endorse considerable neuropathy.  Denies any dizziness prior to fall, felt her feet give way.

## 2019-09-21 NOTE — Evaluation (Signed)
Physical Therapy Evaluation Patient Details Name: Maria [name] MRN: [id number] DOB: [birth year] Today's Date: 09/21/2019   History of Present Illness  79 year old female with a history of chronic neck and back pain, hypertension, hyperlipidemia, neuropathy, diabetes who presents after a fall. Pt has neuropathy and pt's spouse reports a history of ~21 falls in last 6 years while in ED.  Clinical Impression  Pt presents to PT with deficits in functional mobility, gait, balance, endurance, power, strength, sensation, and with a history of frequent falls. Pt currently requires significant physical assistance to perform bed mobility, as well as assistance to prevent potential fall during ambulation with L knee buckling. Pt also demonstrates L foot drag during gait and describes a history of progressive worsening of her LE neuropathy, largely contributing to her falls. Pt will benefit from continued acute PT POC to improve balance, strength, and gait quality in the hopes of reducing falls risk. PT currently recommending discharge to SNF as pt has had 21 falls in the last 6 years per spouse, and based on her current gait and mobility status remains at a high falls risk. If pt decides to discharge home she will benefit from HHPT, and AFO to reduce foot drag in LLE, and physical assistance from caregivers for all mobility.    Follow Up Recommendations SNF;Supervision/Assistance - 24 hour(if pt decide home will need HHPT and assist for all mobility)    Equipment Recommendations  Other (comment)(L AFO to reduce foot drop)    Recommendations for Other Services       Precautions / Restrictions Precautions Precautions: Fall Restrictions Weight Bearing Restrictions: No      Mobility  Bed Mobility Overal bed mobility: Needs Assistance Bed Mobility: Supine to Sit;Sit to Supine     Supine to sit: Mod assist Sit to supine: Mod assist      Transfers Overall transfer level: Needs  assistance Equipment used: Rolling walker (2 wheeled) Transfers: Sit to/from Stand Sit to Stand: Min guard            Ambulation/Gait Ambulation/Gait assistance: Editor, commissioning (Feet): 70 Feet Assistive device: Rolling walker (2 wheeled) Gait Pattern/deviations: Trunk flexed;Step-through pattern;Decreased step length - left;Decreased dorsiflexion - left Gait velocity: reduced Gait velocity interpretation: <1.8 ft/sec, indicate of risk for recurrent falls General Gait Details: pt with shortened step through gait, foot drag of LLE with fatigue and 2 instances of L knee buckling needing minA to correct. PT also provides cues to maintain RW closer to BOS, pt able to intermittently do this but then returns to flexed posture with RW ahead  Stairs            Wheelchair Mobility    Modified Rankin (Stroke Patients Only)       Balance Overall balance assessment: Needs assistance Sitting-balance support: Bilateral upper extremity supported;Feet unsupported Sitting balance-Leahy Scale: Poor Sitting balance - Comments: min-modA at edge of bed Postural control: Posterior lean Standing balance support: Bilateral upper extremity supported Standing balance-Leahy Scale: Fair Standing balance comment: minG for static standing balance                             Pertinent Vitals/Pain Pain Assessment: Faces Faces Pain Scale: Hurts little more Pain Location: R hand Pain Descriptors / Indicators: Grimacing Pain Intervention(s): Monitored during session    Home Living Family/patient expects to be discharged to:: Private residence Living Arrangements: Spouse/significant other Available Help at Discharge: Family;Available 24 hours/day Type of  Home: House Home Access: Stairs to enter Entrance Stairs-Rails: Psychiatric nurse of Steps: 4 Home Layout: One level Home Equipment: Environmental consultant - 2 wheels;Wheelchair - [employer];Shower seat;Grab  bars - toilet;Grab bars - tub/shower      Prior Function Level of Independence: Independent with assistive device(s)         Comments: pt ambulates with cane, frequent falls (21 in last 6 years per spouse)     Hand Dominance        Extremity/Trunk Assessment   Upper Extremity Assessment Upper Extremity Assessment: Generalized weakness    Lower Extremity Assessment Lower Extremity Assessment: Generalized weakness;LLE deficits/detail;RLE deficits/detail RLE Sensation: history of peripheral neuropathy LLE Deficits / Details: L DF 4-/5, drag during gait LLE Sensation: history of peripheral neuropathy    Cervical / Trunk Assessment Cervical / Trunk Assessment: Kyphotic  Communication   Communication: No difficulties  Cognition Arousal/Alertness: Awake/alert Behavior During Therapy: WFL for tasks assessed/performed Overall Cognitive Status: Within Functional Limits for tasks assessed                                        General Comments General comments (skin integrity, edema, etc.): VSS on RA    Exercises     Assessment/Plan    PT Assessment Patient needs continued PT services  PT Problem List Decreased strength;Decreased activity tolerance;Decreased balance;Decreased mobility       PT Treatment Interventions DME instruction;Gait training;Stair training;Functional mobility training;Therapeutic activities;Therapeutic exercise;Balance training;Neuromuscular re-education;Patient/family education    PT Goals (Current goals can be found in the Care Plan section)  Acute Rehab PT Goals Patient Stated Goal: To reduce fall frequency PT Goal Formulation: With patient/family Time For Goal Achievement: 10/05/19 Potential to Achieve Goals: Good    Frequency Min 3X/week   Barriers to discharge        Co-evaluation               AM-PAC PT "6 Clicks" Mobility  Outcome Measure Help needed turning from your back to your side while in a flat bed  without using bedrails?: A Lot Help needed moving from lying on your back to sitting on the side of a flat bed without using bedrails?: A Lot Help needed moving to and from a bed to a chair (including a wheelchair)?: A Little Help needed standing up from a chair using your arms (e.g., wheelchair or bedside chair)?: A Little Help needed to walk in hospital room?: A Little Help needed climbing 3-5 steps with a railing? : A Lot 6 Click Score: 15    End of Session   Activity Tolerance: Patient tolerated treatment well Patient left: in bed;with call bell/phone within reach;with family/visitor present Nurse Communication: Mobility status PT Visit Diagnosis: Unsteadiness on feet (R26.81);History of falling (Z91.81);Other symptoms and signs involving the nervous system (R29.898);Muscle weakness (generalized) (M62.81)    Time: 0254-2706 PT Time Calculation (min) (ACUTE ONLY): 23 min   Charges:   PT Evaluation $PT Eval Moderate Complexity: 1 Mod          Zenaida Niece, PT, DPT Acute Rehabilitation Pager: [phone]   Zenaida Niece 09/21/2019, 3:33 PM

## 2019-09-21 NOTE — Progress Notes (Addendum)
S-Patient came into the ED today with a fall. Patient falls often has had over 20 falls in 6 years. Has neuropathy that has gotten progressively worse.As well, has dizziness, that was accentuated with valium. Patient does not know why she was prescribed valium.    B- Long history of falls and neuropathy surgery on back with rods. Balance issues A- Thin lady, looks stated age, somewhat frail hard of hearing, lying on stretcher currently in hard collar, awaiting CT results. A&O x 4.  R-When cleared from CT- Recommend PT evaluation, to assess type of equipment needed at home. PT Home health and PT for [employer] Maneuvers for possible long standing  Labyrinthitis related to balance issues. Discussed with Dr. Fredderick Phenix.  And Elon Jester with Nursing.

## 2019-09-21 NOTE — Progress Notes (Signed)
PT recommendations were for SNF, however patient does not want to go to SNF at this time. Moving forward with walker at home and Home Health PT/OT and social work.  Appreciate PT input.

## 2019-09-21 NOTE — Discharge Instructions (Signed)
Change the dressings once daily and use antibiotic ointment as discussed.  Follow-up with your primary care doctor for reevaluation.  Return here as needed if you have any worsening symptoms.

## 2019-09-21 NOTE — ED Provider Notes (Addendum)
[place] EMERGENCY DEPARTMENT Provider Note   CSN: [id number] Arrival date & time:        History Chief Complaint  Patient presents with  . Fall  . Dizziness    Maria [name] is a 79 y.o. female.  Patient is a 79 year old female with a history of chronic neck and back pain, hypertension, hyperlipidemia, neuropathy, diabetes who presents after a fall.  She states she was getting some shoes out of her closet and lost her balance causing her to fall backwards hitting her head on the wall.  She had no loss of consciousness.  She complains of pain to her head where she hit it and neck pain.  She says she always has some neck and back pain but feels like her neck pain is a little worse after the fall.  She denies any current back pain.  She has a skin tear to her right hand.  She denies any other injuries from the fall.  She says that she has neuropathy which causes her to fall.  She feels like that is what led her to lose her balance today.  She did not have any dizziness associated with the fall.  She has taken some Valium recently for back pain and has had some dizziness related to that but did not have any dizziness related to the fall.  No associated chest pain or shortness of breath.        Past Medical History:  Diagnosis Date  . Anemia 05/1985  . Carotid artery disease ([place])    a. 02/2015 - 1-39% bilaterally.  . Chronic back pain   . Concussion   . Coronary artery disease    a. s/p DES to [facility] 01/2015.  . DDD (degenerative disc disease), cervical   . DDD (degenerative disc disease), lumbar   . Fracture of lower leg 07/2001   Right  . GERD (gastroesophageal reflux disease)   . Hyperlipidemia   . Hypertension   . Multinodular thyroid   . Pneumonia ?1990's X 1  . PSVT (paroxysmal supraventricular tachycardia) ([place])    a. s/p catheter ablation of a parahisian atrial tachycardia which was successfully ablated from the non-coronary cusp of the aortic root  06/2014.  [name] Renal cyst 07/10/2006   bilateral  . Right knee pain 02/2010   Injected - Dr. Tonita Cong  . Sinus pause    a. By event monitoring 09/2014 - BB stopped at that time.  . Syncope and collapse 07/2001   "that's when I broke my leg"  . Type II diabetes mellitus ([place]) 2002    Patient Active Problem List   Diagnosis Date Noted  . Healthcare maintenance 08/24/2019  . Advance care planning 08/24/2019  . Hand pain 09/05/2018  . Weakness 06/17/2018  . B12 deficiency 06/16/2018  . Neuropathy 06/13/2018  . Neuropathic pain 05/19/2018  . Edema 04/24/2018  . Lumbosacral radiculopathy at L4 02/04/2018  . Right sided sciatica 10/02/2017  . Neck pain 01/02/2017  . Bruising 09/19/2015  . Dysuria 06/15/2015  . Carotid artery disease ([place])   . Coronary atherosclerosis of native coronary artery 01/22/2015  . Coronary artery disease involving native coronary artery of native heart without angina pectoris 01/22/2015  . Chest pain 11/18/2014  . Sinus node dysfunction ([place]) 11/02/2014  . SVT (supraventricular tachycardia) ([place]) 07/06/2014  . Benign paroxysmal positional vertigo 05/21/2014  . Weight loss 06/09/2012  . Functional diarrhea 02/01/2012  . Type 2 diabetes mellitus with vascular disease ([place]) 02/13/2011  .  Shoulder pain, left 09/12/2010  . ADJUSTMENT REACTION, ADULT 05/17/2010  . KNEE PAIN, RIGHT 10/01/2009  . SYNCOPE 02/22/2009  . PSVT 11/12/2008  . GERD 11/12/2008  . Renal cyst 11/12/2008  . Backache 11/12/2008  . Multiple thyroid nodules 08/07/2007  . Reflux esophagitis 08/07/2007  . Hyperlipidemia 08/21/2006  . ANEMIA-NOS 08/21/2006  . DEPRESSION 08/21/2006  . Essential hypertension 08/21/2006  . DEGENERATIVE DISC DISEASE, CERVICAL SPINE 08/21/2006  . DEGENERATIVE DISC DISEASE, LUMBAR SPINE 08/21/2006    Past Surgical History:  Procedure Laterality Date  . ANTERIOR CERVICAL DECOMP/DISCECTOMY FUSION  10/03/2005   C4/5; C5/6  "it's got a plate in there"  . ANTERIOR  LAT LUMBAR FUSION Left 02/04/2018   Procedure: Lumbar two-three Lumbar three-four Lumbar four-five  Anterolateral decompression/percutaneous posterior arthrodesis, [place];  Surgeon: Barnett Abu, MD;  Location: [facility] OR;  Service: Neurosurgery;  Laterality: Left;  . APPLICATION OF ROBOTIC ASSISTANCE FOR SPINAL PROCEDURE N/A 02/04/2018   Procedure: APPLICATION OF ROBOTIC ASSISTANCE FOR SPINAL PROCEDURE;  Surgeon: Barnett Abu, MD;  Location: [facility] OR;  Service: Neurosurgery;  Laterality: N/A;  . BACK SURGERY    . BREAST BIOPSY Bilateral 1968 (multiple)   "all benign"  . BREAST LUMPECTOMY Bilateral 1968  . CARDIAC CATHETERIZATION N/A 11/18/2014   Procedure: Left Heart Cath and Coronary Angiography;  Surgeon: Peter M Swaz[place]and, MD;  Location: [facility] INVASIVE CV LAB;  Service: Cardiovascular;  Laterality: N/A;  . CARDIAC CATHETERIZATION N/A 01/22/2015   Procedure: Coronary Stent Intervention;  Surgeon: Peter M Swaziland, MD;  Location: [facility] INVASIVE CV LAB;  Service: Cardiovascular;  Laterality: N/A;  . CARDIAC CATHETERIZATION N/A 06/10/2015   Procedure: Left Heart Cath and Coronary Angiography;  Surgeon: Peter M Swaziland, MD;  Location: [facility] INVASIVE CV LAB;  Service: Cardiovascular;  Laterality: N/A;  . CATARACT EXTRACTION W/ INTRAOCULAR LENS  IMPLANT, BILATERAL Bilateral 12/2010  . CORONARY ANGIOPLASTY    . ESOPHAGOGASTRODUODENOSCOPY  08/14/2005   gastropathy biopsy, negative  . LAPAROSCOPIC CHOLECYSTECTOMY  08/1992  . LUMBAR PERCUTANEOUS PEDICLE SCREW 3 LEVEL N/A 02/04/2018   Procedure: LUMBAR PERCUTANEOUS PEDICLE SCREW LUMBAR TWO - LUMBAR FIVE;  Surgeon: Barnett Abu, MD;  Location: [facility] OR;  Service: Neurosurgery;  Laterality: N/A;  . POSTERIOR LAMINECTOMY / DECOMPRESSION LUMBAR SPINE  09/2001   due to herniated disc  . SUPRAVENTRICULAR TACHYCARDIA ABLATION N/A 07/06/2014   Procedure: SUPRAVENTRICULAR TACHYCARDIA ABLATION;  Surgeon: Marinus Maw, MD;  Location: [facility] CATH LAB;  Service: Cardiovascular;  Laterality: N/A;    . VAGINAL HYSTERECTOMY  1968   spotting     OB History   No obstetric history on file.     Family History  Problem Relation Age of Onset  . Hypertension Mother   . Heart disease Mother   . Diabetes Sister   . Myasthenia gravis Sister   . Diabetes Brother        1/2 juvenile; foot ulcer; periph. neuropathy  . Heart disease Maternal Grandfather        MI  . Hypertension Maternal Grandfather   . Depression Paternal Aunt   . Hypertension Maternal Grandmother   . Diabetes Maternal Grandmother   . Stroke Maternal Grandmother   . Hypertension Paternal Grandmother   . Diabetes Paternal Grandmother   . Hypertension Paternal Grandfather   . Diabetes Paternal Grandfather   . Breast cancer Maternal Aunt   . Colon cancer Neg Hx     Social History   Tobacco Use  . Smoking status: Never Smoker  . Smokeless tobacco: Never Used  Substance Use Topics  . Alcohol use: No    Alcohol/week: 0.0 standard drinks  . Drug use: No    Home Medications Prior to Admission medications   Medication Sig Start Date End Date Taking? Authorizing Provider  amitriptyline (ELAVIL) 10 MG tablet Take 20-30 mg in the PM. 08/22/19   Joaquim Namuncan, Graham S, MD  aspirin EC 81 MG tablet Take 81 mg by mouth once.     [provider]  atenolol (TENORMIN) 25 MG tablet Take 0.5 tablets (12.5 mg total) by mouth daily. 04/08/18   Wendall StadeNishan, [name], MD  Bempedoic Acid-Ezetimibe (NEXLIZET) 180-10 MG TABS Take 1 tablet by mouth daily. 04/01/19   Wendall StadeNishan, [name], MD  cyanocobalamin (,VITAMIN B-12,) 1000 MCG/ML injection 1000 mcg injected IM every 14 days. 10/08/18   Joaquim Namuncan, Graham S, MD  glucose blood (ONE TOUCH ULTRA TEST) test strip USE AS INSTRUCTED TO TEST BLOOD SUGAR THREE TIMES DAILY. Dx Code E11.59 08/07/18   Joaquim Namuncan, Graham S, MD  glyBURIDE (DIABETA) 5 MG tablet Take 0.5 tablets (2.5 mg total) by mouth at bedtime. If sugar is elevated at night. 04/29/19   Joaquim Namuncan, Graham S, MD  loperamide (IMODIUM) 2 MG capsule  Take 2 mg by mouth 4 (four) times daily as needed for diarrhea or loose stools.     [provider]  nitroGLYCERIN (NITROSTAT) 0.4 MG SL tablet PLACE 1 TABLET (0.4 MG TOTAL) UNDER THE TONGUE EVERY 5 (FIVE) MINUTES AS NEEDED FOR CHEST PAIN. 07/26/17   Rosalio MacadamiaGerhardt, Lori C, NP  omeprazole (PRILOSEC) 20 MG capsule Take 1 capsule (20 mg total) by mouth 2 (two) times daily as needed (for acid reflux/indigestion.). 04/23/18   Joaquim Namuncan, Graham S, MD  traMADol (ULTRAM) 50 MG tablet TAKE 1-2 TABLETS BY MOUTH EVERY 8 HOURS AS NEEDED FOR MODERATE PAIN. MAY CAUSE SEDATION. 06/18/18   Joaquim Namuncan, Graham S, MD    Allergies    Crestor [rosuvastatin], Ezetimibe-simvastatin, Pravastatin, Lyrica [pregabalin], Nsaids, Praluent [alirocumab], Repatha [evolocumab], Acetaminophen, Carvedilol, Gabapentin, Ibuprofen, Insulin glargine, Levemir [insulin detemir], Macrobid [nitrofurantoin macrocrystal], Metformin, Metoprolol tartrate, and Naproxen  Review of Systems   Review of Systems  Constitutional: Negative for chills, diaphoresis, fatigue and fever.  HENT: Negative for congestion, rhinorrhea and sneezing.   Eyes: Negative.   Respiratory: Negative for cough, chest tightness and shortness of breath.   Cardiovascular: Negative for chest pain and leg swelling.  Gastrointestinal: Negative for abdominal pain, blood in stool, diarrhea, nausea and vomiting.  Genitourinary: Negative for difficulty urinating, flank pain, frequency and hematuria.  Musculoskeletal: Positive for back pain (chronic) and neck pain. Negative for arthralgias.  Skin: Positive for wound. Negative for rash.  Neurological: Positive for dizziness and headaches. Negative for speech difficulty, weakness and numbness.    Physical Exam Updated Vital Signs BP 134/74 (BP Location: Left Arm)   Pulse 84   Temp 98.4 F (36.9 C) (Oral)   Resp 18   Ht 5\' 10"  (1.778 m)   Wt 77.1 kg   SpO2 98%   BMI 24.39 kg/m   Physical Exam Constitutional:       Appearance: She is well-developed.  HENT:     Head: Normocephalic and atraumatic.  Eyes:     Pupils: Pupils are equal, round, and reactive to light.  Neck:     Comments: Patient has a c-collar in place.  She has tenderness to her upper cervical spine.  No pain to the thoracic or lumbosacral spine. Cardiovascular:     Rate and Rhythm: Normal rate and regular  rhythm.     Heart sounds: Normal heart sounds.  Pulmonary:     Effort: Pulmonary effort is normal. No respiratory distress.     Breath sounds: Normal breath sounds. No wheezing or rales.  Chest:     Chest wall: No tenderness.  Abdominal:     General: Bowel sounds are normal.     Palpations: Abdomen is soft.     Tenderness: There is no abdominal tenderness. There is no guarding or rebound.  Musculoskeletal:        General: Normal range of motion.     Comments: Patient has a skin tear to the dorsal surface of her right hand.  No underlying bony tenderness.  No pain on palpation or range of motion of the extremities.  Lymphadenopathy:     Cervical: No cervical adenopathy.  Skin:    General: Skin is warm and dry.     Findings: No rash.  Neurological:     Mental Status: She is alert and oriented to person, place, and time.     ED Results / Procedures / Treatments   Labs (all labs ordered are listed, but only abnormal results are displayed) Labs Reviewed - No data to display  EKG None  Radiology CT Head Wo Contrast  Result Date: 09/21/2019 CLINICAL DATA:  Head trauma, headache. Neck pain from fall. Additional history provided: Patient fell this morning hitting head on wall at home. EXAM: CT HEAD WITHOUT CONTRAST CT CERVICAL SPINE WITHOUT CONTRAST TECHNIQUE: Multidetector CT imaging of the head and cervical spine was performed following the standard protocol without intravenous contrast. Multiplanar CT image reconstructions of the cervical spine were also generated. COMPARISON:  Head CT 10/03/2018, CT cervical spine 02/22/2017,  thyroid ultrasound for biopsy 05/31/2017. FINDINGS: CT HEAD FINDINGS Brain: Stable, moderate ill-defined hypoattenuation within the cerebral white matter is nonspecific, but consistent with chronic small vessel ischemic disease. Redemonstrated remote left basal ganglia lacunar infarct. Unchanged mild generalized parenchymal atrophy. There is no acute intracranial hemorrhage. No demarcated cortical infarct. No extra-axial fluid collection. No evidence of intracranial mass. No midline shift. Vascular: No hyperdense vessel.  Atherosclerotic calcifications. Skull: Normal. Negative for fracture or focal lesion. Sinuses/Orbits: Visualized orbits show no acute finding. Small right maxillary sinus air-fluid level. No significant mastoid effusion. CT CERVICAL SPINE FINDINGS Alignment: Unchanged 2 mm C6-C7 grade 1 anterolisthesis. Unchanged trace C7-T1 anterolisthesis. Skull base and vertebrae: The basion-dental and atlanto-dental intervals are maintained.No evidence of acute fracture to the cervical spine. A corticated linear lucency traverses portions of the C6 left inferior articular process which may reflect a remote nondisplaced fracture Soft tissues and spinal canal: No prevertebral fluid or swelling. No visible canal hematoma. Disc levels: Prior C4-C6 ACDF. No evidence of hardware compromise. No more than mild disc degeneration at the non fused levels. Multilevel uncovertebral and facet hypertrophy. No high-grade bony spinal canal stenosis. Upper chest: No consolidation within the imaged lung apices. No visible pneumothorax. Other: Redemonstrated 1.7 cm left thyroid lobe nodule. This nodule was previously biopsied on 05/31/2017. IMPRESSION: CT head: 1. No evidence of acute intracranial abnormality. 2. Stable generalized parenchymal atrophy and chronic small vessel ischemic disease. 3. Right maxillary sinus air-fluid level. Correlate for acute sinusitis. CT cervical spine: 1. No evidence of acute fracture to the  cervical spine. 2. A corticated linear lucency traverses portions of the C6 left inferior articular process, which may reflect a nondisplaced remote fracture. 3. Prior C4-C6 ACDF.  No evidence of hardware compromise. 4. Cervical spondylosis without high-grade bony spinal  canal narrowing. 5. Unchanged C6-C7 and C7-T1 grade 1 retrolisthesis. 6. Redemonstrated 1.7 cm left thyroid lobe nodule. This nodule was biopsied previously on 05/31/2017. Electronically Signed   By: Jackey Loge DO   On: 09/21/2019 13:23   CT Cervical Spine Wo Contrast  Result Date: 09/21/2019 CLINICAL DATA:  Head trauma, headache. Neck pain from fall. Additional history provided: Patient fell this morning hitting head on wall at home. EXAM: CT HEAD WITHOUT CONTRAST CT CERVICAL SPINE WITHOUT CONTRAST TECHNIQUE: Multidetector CT imaging of the head and cervical spine was performed following the standard protocol without intravenous contrast. Multiplanar CT image reconstructions of the cervical spine were also generated. COMPARISON:  Head CT 10/03/2018, CT cervical spine 02/22/2017, thyroid ultrasound for biopsy 05/31/2017. FINDINGS: CT HEAD FINDINGS Brain: Stable, moderate ill-defined hypoattenuation within the cerebral white matter is nonspecific, but consistent with chronic small vessel ischemic disease. Redemonstrated remote left basal ganglia lacunar infarct. Unchanged mild generalized parenchymal atrophy. There is no acute intracranial hemorrhage. No demarcated cortical infarct. No extra-axial fluid collection. No evidence of intracranial mass. No midline shift. Vascular: No hyperdense vessel.  Atherosclerotic calcifications. Skull: Normal. Negative for fracture or focal lesion. Sinuses/Orbits: Visualized orbits show no acute finding. Small right maxillary sinus air-fluid level. No significant mastoid effusion. CT CERVICAL SPINE FINDINGS Alignment: Unchanged 2 mm C6-C7 grade 1 anterolisthesis. Unchanged trace C7-T1 anterolisthesis. Skull  base and vertebrae: The basion-dental and atlanto-dental intervals are maintained.No evidence of acute fracture to the cervical spine. A corticated linear lucency traverses portions of the C6 left inferior articular process which may reflect a remote nondisplaced fracture Soft tissues and spinal canal: No prevertebral fluid or swelling. No visible canal hematoma. Disc levels: Prior C4-C6 ACDF. No evidence of hardware compromise. No more than mild disc degeneration at the non fused levels. Multilevel uncovertebral and facet hypertrophy. No high-grade bony spinal canal stenosis. Upper chest: No consolidation within the imaged lung apices. No visible pneumothorax. Other: Redemonstrated 1.7 cm left thyroid lobe nodule. This nodule was previously biopsied on 05/31/2017. IMPRESSION: CT head: 1. No evidence of acute intracranial abnormality. 2. Stable generalized parenchymal atrophy and chronic small vessel ischemic disease. 3. Right maxillary sinus air-fluid level. Correlate for acute sinusitis. CT cervical spine: 1. No evidence of acute fracture to the cervical spine. 2. A corticated linear lucency traverses portions of the C6 left inferior articular process, which may reflect a nondisplaced remote fracture. 3. Prior C4-C6 ACDF.  No evidence of hardware compromise. 4. Cervical spondylosis without high-grade bony spinal canal narrowing. 5. Unchanged C6-C7 and C7-T1 grade 1 retrolisthesis. 6. Redemonstrated 1.7 cm left thyroid lobe nodule. This nodule was biopsied previously on 05/31/2017. Electronically Signed   By: Jackey Loge DO   On: 09/21/2019 13:23    Procedures Procedures (including critical care time)  Medications Ordered in ED Medications - No data to display  ED Course  I have reviewed the triage vital signs and the nursing notes.  Pertinent labs & imaging results that were available during my care of the patient were reviewed by me and considered in my medical decision making (see chart for  details).    MDM Rules/Calculators/A&P                      Patient is a 79 year old female who presents after a mechanical fall.  She hit her head and has some pain in her head and neck.  She has no acute traumatic injuries noted on CT of her head or cervical spine.  She is neurologically intact.  She has a skin tear to her right hand which was repaired with Steri-Strips and nonstick dressing with antibiotic ointment.  No other injuries are identified.  Case management has seen the patient and recommended a PT consult due to her frequent falls.  Once PT has assessed the patient, we likely can get her discharge.  I have also ordered outpatient home physical therapy and occupational therapy services.  15:39 PT has evaluated patient and recommended skilled nursing facility placement.  Patient is adamantly against this.  She says that with her husband's help she is able to get around at home without too much trouble.  She has not had any recent changes in her mobility.  She said her fall today was due to her neuropathy and balance issues because of that.  It does not sound like she has any acute changes and she does not want skilled nursing home placement.  Outpatient services were arranged. Final Clinical Impression(s) / ED Diagnoses Final diagnoses:  Fall, initial encounter  Injury of head, initial encounter  Skin tear of right hand without complication, initial encounter    Rx / DC Orders ED Discharge Orders    None       Rolan Bucco, MD 09/21/19 1523    Rolan Bucco, MD 09/21/19 1540

## 2019-09-21 NOTE — ED Notes (Signed)
Walker here and pt training done.

## 2019-09-21 NOTE — ED Triage Notes (Signed)
Pt from home with ems for fall. Pt was bending over in the closet, felt dizzy and fell backwards, hit her right hand on doorknob, skin tear noted to hand, bleeding controlled. Denies LOC. Pt recently started on diazepam last week for neuropathy pain and took 1/2 her dose this morning due to it causing her to feel dizzy earlier in the week. C collar in place due to neck pain.  Pt a.o, GCS 15.    Bp 160/92 HR 88 CBG 173 98% on room air [id number]

## 2019-09-21 NOTE — TOC Transition Note (Signed)
Transition of Care [facility]) - CM/SW Discharge Note   Patient Details  Name: TENEIL SHILLER MRN: [id number] Date of Birth: Oct 02, 1940  Transition of Care [facility]) CM/SW Contact:  Lockie Pares, RN Phone Number: 09/21/2019, 4:36 PM   Clinical Narrative:    Patient came in with fall dizzy has had numerous falls. Complins of being dizzy often. Consulted PT to evaluate patient. Their evaluation revealed  SNF placement, however, patient declined.  Walker ordered will be in the room shortly. Home health pending, PT/OT and social work.    Final next level of care: Home w Home Health Services Barriers to Discharge: No Barriers Identified   Patient Goals and CMS Choice Patient states their goals for this hospitalization and ongoing recovery are:: To go home with equipment to keep me from falling      Discharge Placement  Home with Home health and walker                     Discharge Plan and Services                DME Arranged: Dan Humphreys DME Agency: [employer] Date DME Agency Contacted: 09/21/19 Time DME Agency Contacted: [phone] Representative spoke with at DME Agency: [facility] Arranged: PT, OT          Social Determinants of Health (SDOH) Interventions     Readmission Risk Interventions No flowsheet data found.

## 2019-09-21 NOTE — Telephone Encounter (Signed)
Spoke to husband of patient Almedia Cordell. Let him know that Frances Furbish would be calling him to set up home health PT OT

## 2019-09-24 ENCOUNTER — Telehealth: Payer: Self-pay | Admitting: Family Medicine

## 2019-09-24 DIAGNOSIS — M5136 Other intervertebral disc degeneration, lumbar region: Secondary | ICD-10-CM | POA: Diagnosis not present

## 2019-09-24 DIAGNOSIS — I119 Hypertensive heart disease without heart failure: Secondary | ICD-10-CM | POA: Diagnosis not present

## 2019-09-24 DIAGNOSIS — G8929 Other chronic pain: Secondary | ICD-10-CM | POA: Diagnosis not present

## 2019-09-24 DIAGNOSIS — E1140 Type 2 diabetes mellitus with diabetic neuropathy, unspecified: Secondary | ICD-10-CM | POA: Diagnosis not present

## 2019-09-24 DIAGNOSIS — M5030 Other cervical disc degeneration, unspecified cervical region: Secondary | ICD-10-CM | POA: Diagnosis not present

## 2019-09-24 DIAGNOSIS — S61401D Unspecified open wound of right hand, subsequent encounter: Secondary | ICD-10-CM | POA: Diagnosis not present

## 2019-09-24 DIAGNOSIS — M5117 Intervertebral disc disorders with radiculopathy, lumbosacral region: Secondary | ICD-10-CM | POA: Diagnosis not present

## 2019-09-24 DIAGNOSIS — I2510 Atherosclerotic heart disease of native coronary artery without angina pectoris: Secondary | ICD-10-CM | POA: Diagnosis not present

## 2019-09-24 DIAGNOSIS — R296 Repeated falls: Secondary | ICD-10-CM | POA: Diagnosis not present

## 2019-09-24 NOTE — Telephone Encounter (Signed)
Haskel Schroeder, physical therapist with [facility] called requesting Verbal Orders for physical therapy    1 time a week for 1 week 2 times a week for 2 weeks 1 time a week for 1 week   Bhavin also stated that there are several medications on the patient's list that she has told him she is no longer taking

## 2019-09-24 NOTE — Telephone Encounter (Signed)
Please clarify her medication list.  Please give the order.  Thanks.

## 2019-09-25 ENCOUNTER — Telehealth: Payer: Self-pay

## 2019-09-25 DIAGNOSIS — R296 Repeated falls: Secondary | ICD-10-CM | POA: Diagnosis not present

## 2019-09-25 DIAGNOSIS — I119 Hypertensive heart disease without heart failure: Secondary | ICD-10-CM | POA: Diagnosis not present

## 2019-09-25 DIAGNOSIS — M5030 Other cervical disc degeneration, unspecified cervical region: Secondary | ICD-10-CM | POA: Diagnosis not present

## 2019-09-25 DIAGNOSIS — E1140 Type 2 diabetes mellitus with diabetic neuropathy, unspecified: Secondary | ICD-10-CM | POA: Diagnosis not present

## 2019-09-25 DIAGNOSIS — S61401D Unspecified open wound of right hand, subsequent encounter: Secondary | ICD-10-CM | POA: Diagnosis not present

## 2019-09-25 DIAGNOSIS — G8929 Other chronic pain: Secondary | ICD-10-CM | POA: Diagnosis not present

## 2019-09-25 DIAGNOSIS — M5136 Other intervertebral disc degeneration, lumbar region: Secondary | ICD-10-CM | POA: Diagnosis not present

## 2019-09-25 DIAGNOSIS — I2510 Atherosclerotic heart disease of native coronary artery without angina pectoris: Secondary | ICD-10-CM | POA: Diagnosis not present

## 2019-09-25 DIAGNOSIS — M5117 Intervertebral disc disorders with radiculopathy, lumbosacral region: Secondary | ICD-10-CM | POA: Diagnosis not present

## 2019-09-25 NOTE — Telephone Encounter (Signed)
Please give the order.  Thanks.   

## 2019-09-25 NOTE — Telephone Encounter (Signed)
Maria [name] OT with  [facility] request verbal orders for extension of HH OT 2 x a wk for 1 wk for next wk.  Maria [name] also said pt has a cut on back of rt hand that was addressed in hospital; pts husband dressed hand today and telfa was sticking to hand, there are still dried bloody areas around the edges of the cut that is held together with steri strips. Maria [name] OT request verbal order for [facility] nursing services for wound care. Maria [name] said pt and her husband are in agreement about a  [facility] nurse coming to help with wound care.Please advise.

## 2019-09-25 NOTE — Telephone Encounter (Signed)
Order given to [place], OT with [facility].

## 2019-09-25 NOTE — Telephone Encounter (Signed)
Left detailed message on voicemail of Maria [name], PT with [facility].  Spoke with patient about her meds list and patient says that she has a call in to Dr. Fabio Bering office to help her verify her medications because these medications were prescribed by him.

## 2019-09-29 DIAGNOSIS — E1140 Type 2 diabetes mellitus with diabetic neuropathy, unspecified: Secondary | ICD-10-CM | POA: Diagnosis not present

## 2019-09-29 DIAGNOSIS — S61401D Unspecified open wound of right hand, subsequent encounter: Secondary | ICD-10-CM | POA: Diagnosis not present

## 2019-09-29 DIAGNOSIS — I119 Hypertensive heart disease without heart failure: Secondary | ICD-10-CM | POA: Diagnosis not present

## 2019-09-29 DIAGNOSIS — M5030 Other cervical disc degeneration, unspecified cervical region: Secondary | ICD-10-CM | POA: Diagnosis not present

## 2019-09-29 DIAGNOSIS — M5136 Other intervertebral disc degeneration, lumbar region: Secondary | ICD-10-CM | POA: Diagnosis not present

## 2019-09-29 DIAGNOSIS — M5117 Intervertebral disc disorders with radiculopathy, lumbosacral region: Secondary | ICD-10-CM | POA: Diagnosis not present

## 2019-09-29 DIAGNOSIS — G8929 Other chronic pain: Secondary | ICD-10-CM | POA: Diagnosis not present

## 2019-09-29 DIAGNOSIS — I2510 Atherosclerotic heart disease of native coronary artery without angina pectoris: Secondary | ICD-10-CM | POA: Diagnosis not present

## 2019-09-29 DIAGNOSIS — R296 Repeated falls: Secondary | ICD-10-CM | POA: Diagnosis not present

## 2019-09-30 ENCOUNTER — Other Ambulatory Visit: Payer: Self-pay | Admitting: Nurse Practitioner

## 2019-09-30 DIAGNOSIS — G8929 Other chronic pain: Secondary | ICD-10-CM | POA: Diagnosis not present

## 2019-09-30 DIAGNOSIS — R296 Repeated falls: Secondary | ICD-10-CM | POA: Diagnosis not present

## 2019-09-30 DIAGNOSIS — E1140 Type 2 diabetes mellitus with diabetic neuropathy, unspecified: Secondary | ICD-10-CM | POA: Diagnosis not present

## 2019-09-30 DIAGNOSIS — I2510 Atherosclerotic heart disease of native coronary artery without angina pectoris: Secondary | ICD-10-CM | POA: Diagnosis not present

## 2019-09-30 DIAGNOSIS — M5136 Other intervertebral disc degeneration, lumbar region: Secondary | ICD-10-CM | POA: Diagnosis not present

## 2019-09-30 DIAGNOSIS — M5117 Intervertebral disc disorders with radiculopathy, lumbosacral region: Secondary | ICD-10-CM | POA: Diagnosis not present

## 2019-09-30 DIAGNOSIS — S61401D Unspecified open wound of right hand, subsequent encounter: Secondary | ICD-10-CM | POA: Diagnosis not present

## 2019-09-30 DIAGNOSIS — M5030 Other cervical disc degeneration, unspecified cervical region: Secondary | ICD-10-CM | POA: Diagnosis not present

## 2019-09-30 DIAGNOSIS — I119 Hypertensive heart disease without heart failure: Secondary | ICD-10-CM | POA: Diagnosis not present

## 2019-10-01 DIAGNOSIS — M5030 Other cervical disc degeneration, unspecified cervical region: Secondary | ICD-10-CM | POA: Diagnosis not present

## 2019-10-01 DIAGNOSIS — S61401D Unspecified open wound of right hand, subsequent encounter: Secondary | ICD-10-CM | POA: Diagnosis not present

## 2019-10-01 DIAGNOSIS — M5117 Intervertebral disc disorders with radiculopathy, lumbosacral region: Secondary | ICD-10-CM | POA: Diagnosis not present

## 2019-10-01 DIAGNOSIS — E1140 Type 2 diabetes mellitus with diabetic neuropathy, unspecified: Secondary | ICD-10-CM | POA: Diagnosis not present

## 2019-10-01 DIAGNOSIS — R296 Repeated falls: Secondary | ICD-10-CM | POA: Diagnosis not present

## 2019-10-01 DIAGNOSIS — G8929 Other chronic pain: Secondary | ICD-10-CM | POA: Diagnosis not present

## 2019-10-01 DIAGNOSIS — I119 Hypertensive heart disease without heart failure: Secondary | ICD-10-CM | POA: Diagnosis not present

## 2019-10-01 DIAGNOSIS — M5136 Other intervertebral disc degeneration, lumbar region: Secondary | ICD-10-CM | POA: Diagnosis not present

## 2019-10-01 DIAGNOSIS — I2510 Atherosclerotic heart disease of native coronary artery without angina pectoris: Secondary | ICD-10-CM | POA: Diagnosis not present

## 2019-10-02 DIAGNOSIS — I119 Hypertensive heart disease without heart failure: Secondary | ICD-10-CM | POA: Diagnosis not present

## 2019-10-02 DIAGNOSIS — M5136 Other intervertebral disc degeneration, lumbar region: Secondary | ICD-10-CM | POA: Diagnosis not present

## 2019-10-02 DIAGNOSIS — G8929 Other chronic pain: Secondary | ICD-10-CM | POA: Diagnosis not present

## 2019-10-02 DIAGNOSIS — S61401D Unspecified open wound of right hand, subsequent encounter: Secondary | ICD-10-CM | POA: Diagnosis not present

## 2019-10-02 DIAGNOSIS — I2510 Atherosclerotic heart disease of native coronary artery without angina pectoris: Secondary | ICD-10-CM | POA: Diagnosis not present

## 2019-10-02 DIAGNOSIS — R296 Repeated falls: Secondary | ICD-10-CM | POA: Diagnosis not present

## 2019-10-02 DIAGNOSIS — M5117 Intervertebral disc disorders with radiculopathy, lumbosacral region: Secondary | ICD-10-CM | POA: Diagnosis not present

## 2019-10-02 DIAGNOSIS — M5030 Other cervical disc degeneration, unspecified cervical region: Secondary | ICD-10-CM | POA: Diagnosis not present

## 2019-10-02 DIAGNOSIS — E1140 Type 2 diabetes mellitus with diabetic neuropathy, unspecified: Secondary | ICD-10-CM | POA: Diagnosis not present

## 2019-10-03 DIAGNOSIS — E042 Nontoxic multinodular goiter: Secondary | ICD-10-CM

## 2019-10-03 DIAGNOSIS — Z8701 Personal history of pneumonia (recurrent): Secondary | ICD-10-CM

## 2019-10-03 DIAGNOSIS — M170 Bilateral primary osteoarthritis of knee: Secondary | ICD-10-CM

## 2019-10-03 DIAGNOSIS — Z9181 History of falling: Secondary | ICD-10-CM

## 2019-10-03 DIAGNOSIS — Z7982 Long term (current) use of aspirin: Secondary | ICD-10-CM

## 2019-10-03 DIAGNOSIS — G8929 Other chronic pain: Secondary | ICD-10-CM | POA: Diagnosis not present

## 2019-10-03 DIAGNOSIS — D509 Iron deficiency anemia, unspecified: Secondary | ICD-10-CM

## 2019-10-03 DIAGNOSIS — M5136 Other intervertebral disc degeneration, lumbar region: Secondary | ICD-10-CM | POA: Diagnosis not present

## 2019-10-03 DIAGNOSIS — Z7984 Long term (current) use of oral hypoglycemic drugs: Secondary | ICD-10-CM

## 2019-10-03 DIAGNOSIS — I6523 Occlusion and stenosis of bilateral carotid arteries: Secondary | ICD-10-CM

## 2019-10-03 DIAGNOSIS — I119 Hypertensive heart disease without heart failure: Secondary | ICD-10-CM | POA: Diagnosis not present

## 2019-10-03 DIAGNOSIS — S61401D Unspecified open wound of right hand, subsequent encounter: Secondary | ICD-10-CM | POA: Diagnosis not present

## 2019-10-03 DIAGNOSIS — K219 Gastro-esophageal reflux disease without esophagitis: Secondary | ICD-10-CM

## 2019-10-03 DIAGNOSIS — M5030 Other cervical disc degeneration, unspecified cervical region: Secondary | ICD-10-CM | POA: Diagnosis not present

## 2019-10-03 DIAGNOSIS — R296 Repeated falls: Secondary | ICD-10-CM | POA: Diagnosis not present

## 2019-10-03 DIAGNOSIS — E785 Hyperlipidemia, unspecified: Secondary | ICD-10-CM

## 2019-10-03 DIAGNOSIS — I2510 Atherosclerotic heart disease of native coronary artery without angina pectoris: Secondary | ICD-10-CM | POA: Diagnosis not present

## 2019-10-03 DIAGNOSIS — N281 Cyst of kidney, acquired: Secondary | ICD-10-CM

## 2019-10-03 DIAGNOSIS — F329 Major depressive disorder, single episode, unspecified: Secondary | ICD-10-CM

## 2019-10-03 DIAGNOSIS — Z981 Arthrodesis status: Secondary | ICD-10-CM

## 2019-10-03 DIAGNOSIS — M5117 Intervertebral disc disorders with radiculopathy, lumbosacral region: Secondary | ICD-10-CM | POA: Diagnosis not present

## 2019-10-03 DIAGNOSIS — M47812 Spondylosis without myelopathy or radiculopathy, cervical region: Secondary | ICD-10-CM

## 2019-10-03 DIAGNOSIS — W19XXXD Unspecified fall, subsequent encounter: Secondary | ICD-10-CM

## 2019-10-03 DIAGNOSIS — E1140 Type 2 diabetes mellitus with diabetic neuropathy, unspecified: Secondary | ICD-10-CM | POA: Diagnosis not present

## 2019-10-06 ENCOUNTER — Ambulatory Visit (INDEPENDENT_AMBULATORY_CARE_PROVIDER_SITE_OTHER): Payer: Medicare PPO | Admitting: Family Medicine

## 2019-10-06 ENCOUNTER — Encounter: Payer: Self-pay | Admitting: Family Medicine

## 2019-10-06 ENCOUNTER — Telehealth: Payer: Self-pay

## 2019-10-06 ENCOUNTER — Other Ambulatory Visit: Payer: Self-pay

## 2019-10-06 DIAGNOSIS — I119 Hypertensive heart disease without heart failure: Secondary | ICD-10-CM | POA: Diagnosis not present

## 2019-10-06 DIAGNOSIS — G8929 Other chronic pain: Secondary | ICD-10-CM | POA: Diagnosis not present

## 2019-10-06 DIAGNOSIS — S61401D Unspecified open wound of right hand, subsequent encounter: Secondary | ICD-10-CM | POA: Diagnosis not present

## 2019-10-06 DIAGNOSIS — R296 Repeated falls: Secondary | ICD-10-CM | POA: Diagnosis not present

## 2019-10-06 DIAGNOSIS — M5117 Intervertebral disc disorders with radiculopathy, lumbosacral region: Secondary | ICD-10-CM | POA: Diagnosis not present

## 2019-10-06 DIAGNOSIS — S61419D Laceration without foreign body of unspecified hand, subsequent encounter: Secondary | ICD-10-CM | POA: Diagnosis not present

## 2019-10-06 DIAGNOSIS — E1140 Type 2 diabetes mellitus with diabetic neuropathy, unspecified: Secondary | ICD-10-CM | POA: Diagnosis not present

## 2019-10-06 DIAGNOSIS — M5030 Other cervical disc degeneration, unspecified cervical region: Secondary | ICD-10-CM | POA: Diagnosis not present

## 2019-10-06 DIAGNOSIS — I2510 Atherosclerotic heart disease of native coronary artery without angina pectoris: Secondary | ICD-10-CM | POA: Diagnosis not present

## 2019-10-06 DIAGNOSIS — M5136 Other intervertebral disc degeneration, lumbar region: Secondary | ICD-10-CM | POA: Diagnosis not present

## 2019-10-06 NOTE — Progress Notes (Signed)
This visit occurred during the SARS-CoV-2 public health emergency.  Safety protocols were in place, including screening questions prior to the visit, additional usage of staff PPE, and extensive cleaning of exam room while observing appropriate contact time as indicated for disinfecting solutions.  ER f/u.  She fell into the closet at home, lost her balance.  Called 911.  ER eval done.  Unclear if that was related to diazepam use, used as muscle relaxer.  No syncope.    Laceration on the dorsum R hand.  Appears to be healing cleanly now.  Not draining now.    D/w pt about mentation.  Alert and oriented, knows the year month and day 2021.  May.  24th.  Monday.  She didn't notice confusion but husband did note that her attention span was lower, making scheduling errors.  Occ repeating questions.  Some mild HA after hitting her head with the event.  No photophobia.  She is doing well reading.  Denies dysuria.  Meds, vitals, and allergies reviewed.   ROS: Per HPI unless specifically indicated in ROS section   nad ncat Speech normal.  Affect normal.  Judgment appears intact. rrr ctab Clean appearing laceration on the dorsum of the right hand.  Redressed with nonstick bandage and Neosporin with Coban overlay.  Distally neurovascular intact. Abdomen soft, not tender.

## 2019-10-06 NOTE — Patient Instructions (Signed)
Keep the cut clean and covered and update me as needed.  Take care.  Glad to see you.

## 2019-10-06 NOTE — Telephone Encounter (Signed)
Angel left v/m as FYI; pt already has appt today at 3 PM with Dr Para March. Lawanna Kobus concerned pt may have UTI; did not list symptoms except husband was concerned about pt having confusion that might be related to possible UTI. Pt also has a wound that Lawanna Kobus wanted Dr Para March to see. The appt note has place on back of hand? Unable to reach [place] by phone. FYI to Dr Para March and CMA.

## 2019-10-07 NOTE — Telephone Encounter (Signed)
See OV note.  Thanks.  

## 2019-10-08 DIAGNOSIS — R296 Repeated falls: Secondary | ICD-10-CM | POA: Diagnosis not present

## 2019-10-08 DIAGNOSIS — M5030 Other cervical disc degeneration, unspecified cervical region: Secondary | ICD-10-CM | POA: Diagnosis not present

## 2019-10-08 DIAGNOSIS — G8929 Other chronic pain: Secondary | ICD-10-CM | POA: Diagnosis not present

## 2019-10-08 DIAGNOSIS — I119 Hypertensive heart disease without heart failure: Secondary | ICD-10-CM | POA: Diagnosis not present

## 2019-10-08 DIAGNOSIS — M5117 Intervertebral disc disorders with radiculopathy, lumbosacral region: Secondary | ICD-10-CM | POA: Diagnosis not present

## 2019-10-08 DIAGNOSIS — I2510 Atherosclerotic heart disease of native coronary artery without angina pectoris: Secondary | ICD-10-CM | POA: Diagnosis not present

## 2019-10-08 DIAGNOSIS — E1140 Type 2 diabetes mellitus with diabetic neuropathy, unspecified: Secondary | ICD-10-CM | POA: Diagnosis not present

## 2019-10-08 DIAGNOSIS — S61401D Unspecified open wound of right hand, subsequent encounter: Secondary | ICD-10-CM | POA: Diagnosis not present

## 2019-10-08 DIAGNOSIS — S61419A Laceration without foreign body of unspecified hand, initial encounter: Secondary | ICD-10-CM | POA: Insufficient documentation

## 2019-10-08 DIAGNOSIS — M5136 Other intervertebral disc degeneration, lumbar region: Secondary | ICD-10-CM | POA: Diagnosis not present

## 2019-10-08 NOTE — Assessment & Plan Note (Signed)
Redressed as above.  Routine cautions discussed with patient.  Does not appear infected.  Update me as needed.  Should gradually heal.  Fall cautions discussed with patient.  She has apparently normal mentation at the office visit and it is reasonable to follow clinically for now.

## 2019-10-09 DIAGNOSIS — E1140 Type 2 diabetes mellitus with diabetic neuropathy, unspecified: Secondary | ICD-10-CM | POA: Diagnosis not present

## 2019-10-09 DIAGNOSIS — M5117 Intervertebral disc disorders with radiculopathy, lumbosacral region: Secondary | ICD-10-CM | POA: Diagnosis not present

## 2019-10-09 DIAGNOSIS — G8929 Other chronic pain: Secondary | ICD-10-CM | POA: Diagnosis not present

## 2019-10-09 DIAGNOSIS — I2510 Atherosclerotic heart disease of native coronary artery without angina pectoris: Secondary | ICD-10-CM | POA: Diagnosis not present

## 2019-10-09 DIAGNOSIS — R296 Repeated falls: Secondary | ICD-10-CM | POA: Diagnosis not present

## 2019-10-09 DIAGNOSIS — I119 Hypertensive heart disease without heart failure: Secondary | ICD-10-CM | POA: Diagnosis not present

## 2019-10-09 DIAGNOSIS — M5030 Other cervical disc degeneration, unspecified cervical region: Secondary | ICD-10-CM | POA: Diagnosis not present

## 2019-10-09 DIAGNOSIS — M5136 Other intervertebral disc degeneration, lumbar region: Secondary | ICD-10-CM | POA: Diagnosis not present

## 2019-10-09 DIAGNOSIS — S61401D Unspecified open wound of right hand, subsequent encounter: Secondary | ICD-10-CM | POA: Diagnosis not present

## 2019-10-15 DIAGNOSIS — M5136 Other intervertebral disc degeneration, lumbar region: Secondary | ICD-10-CM | POA: Diagnosis not present

## 2019-10-15 DIAGNOSIS — I2510 Atherosclerotic heart disease of native coronary artery without angina pectoris: Secondary | ICD-10-CM | POA: Diagnosis not present

## 2019-10-15 DIAGNOSIS — E1140 Type 2 diabetes mellitus with diabetic neuropathy, unspecified: Secondary | ICD-10-CM | POA: Diagnosis not present

## 2019-10-15 DIAGNOSIS — M5117 Intervertebral disc disorders with radiculopathy, lumbosacral region: Secondary | ICD-10-CM | POA: Diagnosis not present

## 2019-10-15 DIAGNOSIS — S61401D Unspecified open wound of right hand, subsequent encounter: Secondary | ICD-10-CM | POA: Diagnosis not present

## 2019-10-15 DIAGNOSIS — M5030 Other cervical disc degeneration, unspecified cervical region: Secondary | ICD-10-CM | POA: Diagnosis not present

## 2019-10-15 DIAGNOSIS — I119 Hypertensive heart disease without heart failure: Secondary | ICD-10-CM | POA: Diagnosis not present

## 2019-10-15 DIAGNOSIS — R296 Repeated falls: Secondary | ICD-10-CM | POA: Diagnosis not present

## 2019-10-15 DIAGNOSIS — G8929 Other chronic pain: Secondary | ICD-10-CM | POA: Diagnosis not present

## 2019-10-16 DIAGNOSIS — I119 Hypertensive heart disease without heart failure: Secondary | ICD-10-CM | POA: Diagnosis not present

## 2019-10-16 DIAGNOSIS — S61401D Unspecified open wound of right hand, subsequent encounter: Secondary | ICD-10-CM | POA: Diagnosis not present

## 2019-10-16 DIAGNOSIS — E1140 Type 2 diabetes mellitus with diabetic neuropathy, unspecified: Secondary | ICD-10-CM | POA: Diagnosis not present

## 2019-10-16 DIAGNOSIS — M5136 Other intervertebral disc degeneration, lumbar region: Secondary | ICD-10-CM | POA: Diagnosis not present

## 2019-10-16 DIAGNOSIS — M5117 Intervertebral disc disorders with radiculopathy, lumbosacral region: Secondary | ICD-10-CM | POA: Diagnosis not present

## 2019-10-16 DIAGNOSIS — G8929 Other chronic pain: Secondary | ICD-10-CM | POA: Diagnosis not present

## 2019-10-16 DIAGNOSIS — M5030 Other cervical disc degeneration, unspecified cervical region: Secondary | ICD-10-CM | POA: Diagnosis not present

## 2019-10-16 DIAGNOSIS — I2510 Atherosclerotic heart disease of native coronary artery without angina pectoris: Secondary | ICD-10-CM | POA: Diagnosis not present

## 2019-10-16 DIAGNOSIS — R296 Repeated falls: Secondary | ICD-10-CM | POA: Diagnosis not present

## 2019-10-21 DIAGNOSIS — S61401D Unspecified open wound of right hand, subsequent encounter: Secondary | ICD-10-CM | POA: Diagnosis not present

## 2019-10-21 DIAGNOSIS — I119 Hypertensive heart disease without heart failure: Secondary | ICD-10-CM | POA: Diagnosis not present

## 2019-10-21 DIAGNOSIS — R296 Repeated falls: Secondary | ICD-10-CM | POA: Diagnosis not present

## 2019-10-21 DIAGNOSIS — M5030 Other cervical disc degeneration, unspecified cervical region: Secondary | ICD-10-CM | POA: Diagnosis not present

## 2019-10-21 DIAGNOSIS — I2510 Atherosclerotic heart disease of native coronary artery without angina pectoris: Secondary | ICD-10-CM | POA: Diagnosis not present

## 2019-10-21 DIAGNOSIS — E1140 Type 2 diabetes mellitus with diabetic neuropathy, unspecified: Secondary | ICD-10-CM | POA: Diagnosis not present

## 2019-10-21 DIAGNOSIS — M5136 Other intervertebral disc degeneration, lumbar region: Secondary | ICD-10-CM | POA: Diagnosis not present

## 2019-10-21 DIAGNOSIS — G8929 Other chronic pain: Secondary | ICD-10-CM | POA: Diagnosis not present

## 2019-10-21 DIAGNOSIS — M5117 Intervertebral disc disorders with radiculopathy, lumbosacral region: Secondary | ICD-10-CM | POA: Diagnosis not present

## 2019-10-30 DIAGNOSIS — R1084 Generalized abdominal pain: Secondary | ICD-10-CM | POA: Diagnosis not present

## 2019-10-30 DIAGNOSIS — R109 Unspecified abdominal pain: Secondary | ICD-10-CM | POA: Diagnosis not present

## 2019-12-13 ENCOUNTER — Other Ambulatory Visit: Payer: Self-pay | Admitting: Family Medicine

## 2020-01-02 ENCOUNTER — Ambulatory Visit: Payer: Medicare PPO | Admitting: Neurology

## 2020-01-02 ENCOUNTER — Other Ambulatory Visit: Payer: Self-pay

## 2020-01-02 ENCOUNTER — Encounter: Payer: Self-pay | Admitting: Neurology

## 2020-01-02 VITALS — BP 162/84 | HR 74 | Resp 18 | Ht 70.0 in | Wt 159.0 lb

## 2020-01-02 DIAGNOSIS — G629 Polyneuropathy, unspecified: Secondary | ICD-10-CM | POA: Diagnosis not present

## 2020-01-02 DIAGNOSIS — M5417 Radiculopathy, lumbosacral region: Secondary | ICD-10-CM | POA: Diagnosis not present

## 2020-01-02 NOTE — Patient Instructions (Signed)
Check feet daily  Use shower chair  Try using a pediatric size rollator for support

## 2020-01-02 NOTE — Progress Notes (Signed)
[facility] Neurology Division Clinic Note - Initial Visit   Date: 01/02/20  Maria [name] MRN: [id number] DOB: 11-12-1940   Dear Dr. Para March:  Thank you for your kind referral of Maria [name] for consultation of neuropathy. Although her history is well known to you, please allow [name] to reiterate it for the purpose of our medical record. The patient was accompanied to the clinic by husband who also provides collateral information.     History of Present Illness: Maria [name] is a 79 y.o. right-handed female with CAD, hypertension, hyperlipidemia, diabetes mellitus, s/p ACDF at C4-6, and PLIF L2-5 (2019), and GERD presenting for evaluation of neuropathy.   She reports having numbness over the top of both feet and into her lower legs since her back surgery in 2019.  Symptoms are constant without any alleviating or exacerbating factors.  She denies tingling or burning pain, but has sharp pain in the feet.  Over the past 3 weeks, she has sharp stabbing pain over the left thigh.  She endorses mild weakness in the legs and imbalance.  She uses a cane and has fallen several times.  She takes amitriptyline 10mg  in the morning and 20mg  at bedtime which helps her sleep and get some relief. MRI lumbar spine from 2020 was reviewed and shows stable fusion with left L1-2 foraminal stenosis.  Her diabetes is well-controlled.  No history of alcohol abuse.  Vitamin B12 and TSH is normal.    Out-side paper records, electronic medical record, and images have been reviewed where available and summarized as:  CT head and cervical spine 09/21/2019: CT head:  1. No evidence of acute intracranial abnormality. 2. Stable generalized parenchymal atrophy and chronic small vessel ischemic disease. 3. Right maxillary sinus air-fluid level. Correlate for acute sinusitis.  CT cervical spine: 1. No evidence of acute fracture to the cervical spine. 2. A corticated linear lucency traverses portions of the  C6 left inferior articular process, which may reflect a nondisplaced remote fracture. 3. Prior C4-C6 ACDF.  No evidence of hardware compromise. 4. Cervical spondy losis without high-grade bony spinal canal narrowing. 5. Unchanged C6-C7 and C7-T1 grade 1 retrolisthesis. 6. Redemonstrated 1.7 cm left thyroid lobe nodule. This nodule was biopsied previously on 05/31/2017.  MRI lumbar spine wwo contrast 07/16/2018: 1. L2-L5 PLIF and interbody fusion. 2. Stable L1-2 left foraminal moderate to severe multifactorial stenosis. Otherwise no significant residual spinal canal stenosis or foraminal stenosis. 3. No acute osseous abnormality. No abnormal enhancement of cauda equina  Lab Results  Component Value Date   HGBA1C 5.9 (H) 08/14/2019   Lab Results  Component Value Date   VITAMINB12 542 08/14/2019   Lab Results  Component Value Date   TSH 2.13 06/13/2018   No results found for: ESRSEDRATE, POCTSEDRATE  Past Medical History:  Diagnosis Date  . Anemia 05/1985  . Carotid artery disease (HCC)    a. 02/2015 - 1-39% bilaterally.  . Chronic back pain   . Concussion   . Coronary artery disease    a. s/p DES to [facility] 01/2015.  . DDD (degenerative disc disease), cervical   . DDD (degenerative disc disease), lumbar   . Fracture of lower leg 07/2001   Right  . GERD (gastroesophageal reflux disease)   . Hyperlipidemia   . Hypertension   . Multinodular thyroid   . Pneumonia ?1990's X 1  . PSVT (paroxysmal supraventricular tachycardia) (HCC)    a. s/p catheter ablation of a parahisian atrial tachycardia which was successfully  ablated from the non-coronary cusp of the aortic root 06/2014.  [name]. Renal cyst 07/10/2006   bilateral  . Right knee pain 02/2010   Injected - Dr. Shelle IronBeane  . Sinus pause    a. By event monitoring 09/2014 - BB stopped at that time.  . Syncope and collapse 07/2001   "that's when I broke my leg"  . Type II diabetes mellitus (HCC) 2002    Past Surgical History:  Procedure  Laterality Date  . ANTERIOR CERVICAL DECOMP/DISCECTOMY FUSION  10/03/2005   C4/5; C5/6  "it's got a plate in there"  . ANTERIOR LAT LUMBAR FUSION Left 02/04/2018   Procedure: Lumbar two-three Lumbar three-four Lumbar four-five  Anterolateral decompression/percutaneous posterior arthrodesis, Mazor;  Surgeon: Barnett AbuElsner, Henry, MD;  Location: [facility] OR;  Service: Neurosurgery;  Laterality: Left;  . APPLICATION OF ROBOTIC ASSISTANCE FOR SPINAL PROCEDURE N/A 02/04/2018   Procedure: APPLICATION OF ROBOTIC ASSISTANCE FOR SPINAL PROCEDURE;  Surgeon: Barnett AbuElsner, Henry, MD;  Location: [facility] OR;  Service: Neurosurgery;  Laterality: N/A;  . BACK SURGERY    . BREAST BIOPSY Bilateral 1968 (multiple)   "all benign"  . BREAST LUMPECTOMY Bilateral 1968  . CARDIAC CATHETERIZATION N/A 11/18/2014   Procedure: Left Heart Cath and Coronary Angiography;  Surgeon: Peter M SwazilandJordan, MD;  [facility];  Service: Cardiovascular;  Laterality: N/A;  . CARDIAC CATHETERIZATION N/A 01/22/2015   Procedure: Coronary Stent Intervention;  Surgeon: Peter M SwazilandJordan, MD;  [facility];  Service: Cardiovascular;  Laterality: N/A;  . CARDIAC CATHETERIZATION N/A 06/10/2015   Procedure: Left Heart Cath and Coronary Angiography;  Surgeon: Peter M SwazilandJordan, MD;  [facility];  Service: Cardiovascular;  Laterality: N/A;  . CATARACT EXTRACTION W/ INTRAOCULAR LENS  IMPLANT, BILATERAL Bilateral 12/2010  . CORONARY ANGIOPLASTY    . ESOPHAGOGASTRODUODENOSCOPY  08/14/2005   gastropathy biopsy, negative  . LAPAROSCOPIC CHOLECYSTECTOMY  08/1992  . LUMBAR PERCUTANEOUS PEDICLE SCREW 3 LEVEL N/A 02/04/2018   Procedure: LUMBAR PERCUTANEOUS PEDICLE SCREW LUMBAR TWO - LUMBAR FIVE;  Surgeon: Barnett AbuElsner, Henry, MD;  Location: [facility] OR;  Service: Neurosurgery;  Laterality: N/A;  . POSTERIOR LAMINECTOMY / DECOMPRESSION LUMBAR SPINE  09/2001   due to herniated disc  . SUPRAVENTRICULAR TACHYCARDIA ABLATION N/A 07/06/2014   Procedure:  SUPRAVENTRICULAR TACHYCARDIA ABLATION;  Surgeon: Marinus MawGregg W Taylor, MD;  [facility];  Service: Cardiovascular;  Laterality: N/A;  . VAGINAL HYSTERECTOMY  1968   spotting     Medications:  Outpatient Encounter Medications as of 01/02/2020  Medication Sig  . amitriptyline (ELAVIL) 10 MG tablet Take 20-30 mg in the PM.  . aspirin EC 81 MG tablet Take 81 mg by mouth once.   [name]. atenolol (TENORMIN) 25 MG tablet Take 0.5 tablets (12.5 mg total) by mouth daily.  . Bempedoic Acid-Ezetimibe (NEXLIZET) 180-10 MG TABS Take 1 tablet by mouth daily.  . cyanocobalamin (,VITAMIN B-12,) 1000 MCG/ML injection 1000 mcg injected IM every 14 days.  [name]. glucose blood (ONE TOUCH ULTRA TEST) test strip USE AS INSTRUCTED TO TEST BLOOD SUGAR THREE TIMES DAILY. Dx Code E11.59  . glyBURIDE (DIABETA) 5 MG tablet TAKE 1 TO 2 TABLETS BY MOUTH DAILY WITH BREAKFAST.  [name]. loperamide (IMODIUM) 2 MG capsule Take 2 mg by mouth 4 (four) times daily as needed for diarrhea or loose stools.   . nitroGLYCERIN (NITROSTAT) 0.4 MG SL tablet PLACE 1 TABLET UNDER THE TONGUE EVERY 5 MINUTES AS NEEDED FOR CHEST PAIN FOR UP TO 3 DOSES  .  omeprazole (PRILOSEC) 20 MG capsule Take 1 capsule (20 mg total) by mouth 2 (two) times daily as needed (for acid reflux/indigestion.).  [name] traMADol (ULTRAM) 50 MG tablet TAKE 1-2 TABLETS BY MOUTH EVERY 8 HOURS AS NEEDED FOR MODERATE PAIN. MAY CAUSE SEDATION.   No facility-administered encounter medications on file as of 01/02/2020.    Allergies:  Allergies  Allergen Reactions  . Crestor [Rosuvastatin] Other (See Comments)    MYALGIAS  . Ezetimibe-Simvastatin Other (See Comments)    MYALGIAS  . Pravastatin Other (See Comments)    Myalgias  . Diazepam     Balance difficulty.    Roselee Nova [Pregabalin]     swelling  . Nsaids     UNSPECIFIED REACTION   . Praluent [Alirocumab]     myalgias  . Repatha [Evolocumab]     Hoarse voice  . Acetaminophen Anxiety and Other (See Comments)    REACTION:  jittery with plain tylenol, but tolerated tylenol/benadryl combination  . Carvedilol Other (See Comments)    nightmares  . Gabapentin Diarrhea  . Ibuprofen Other (See Comments)    REACTION: GI upset at high doses  . Insulin Glargine Rash    Rash  . Levemir [Insulin Detemir] Rash  . Macrobid [Nitrofurantoin Macrocrystal] Nausea And Vomiting and Other (See Comments)    ACHES  . Metformin Nausea And Vomiting    REACTION: GI upset  . Metoprolol Tartrate Other (See Comments)    REACTION: nightmares  . Naproxen Nausea And Vomiting    REACTION: GI upset    Family History: Family History  Problem Relation Age of Onset  . Hypertension Mother   . Heart disease Mother   . Diabetes Sister   . Myasthenia gravis Sister   . Diabetes Brother        1/2 juvenile; foot ulcer; periph. neuropathy  . Heart disease Maternal Grandfather        MI  . Hypertension Maternal Grandfather   . Depression Paternal Aunt   . Hypertension Maternal Grandmother   . Diabetes Maternal Grandmother   . Stroke Maternal Grandmother   . Hypertension Paternal Grandmother   . Diabetes Paternal Grandmother   . Hypertension Paternal Grandfather   . Diabetes Paternal Grandfather   . Breast cancer Maternal Aunt   . Colon cancer Neg Hx     Social History: Social History   Tobacco Use  . Smoking status: Never Smoker  . Smokeless tobacco: Never Used  Vaping Use  . Vaping Use: Never used  Substance Use Topics  . Alcohol use: No    Alcohol/week: 0.0 standard drinks  . Drug use: No   Social History   Social History Narrative   Widowed 2011 after 52 years of marriage (husband had C.diff)   Remarried 2015   Right handed   One story home   Occasional caffeine    Vital Signs:  BP (!) 162/84   Pulse 74   Resp 18   Ht 5\' 10"  (1.778 m)   Wt 159 lb (72.1 kg)   SpO2 98%   BMI 22.81 kg/m    Neurological Exam: MENTAL STATUS including orientation to time, place, person, recent and remote memory, attention  span and concentration, language, and fund of knowledge is normal.  Speech is not dysarthric.  CRANIAL NERVES: II:  No visual field defects.    III-IV-VI: Pupils equal round and reactive to light.  Normal conjugate, extra-ocular eye movements in all directions of gaze.  No nystagmus.  No ptosis.  V:  Normal facial sensation.    VII:  Normal facial symmetry and movements.   VIII:  Normal hearing and vestibular function.   IX-X:  Normal palatal movement.   XI:  Normal shoulder shrug and head rotation.   XII:  Normal tongue strength and range of motion, no deviation or fasciculation.  MOTOR:  No atrophy, fasciculations or abnormal movements.  No pronator drift.   Upper Extremity:  Right  Left  Deltoid  5/5   5/5   Biceps  5/5   5/5   Triceps  5/5   5/5   Infraspinatus 5/5  5/5  Medial pectoralis 5/5  5/5  Wrist extensors  5/5   5/5   Wrist flexors  5/5   5/5   Finger extensors  5/5   5/5   Finger flexors  5/5   5/5   Dorsal interossei  5/5   5/5   Abductor pollicis  5/5   5/5   Tone (Ashworth scale)  0  0   Lower Extremity:  Right  Left  Hip flexors  5/5   5/5   Hip extensors  5/5   5/5   Adductor 5/5  5/5  Abductor 5/5  5/5  Knee flexors  5/5   5/5   Knee extensors  5/5   5/5   Dorsiflexors  5/5   5/5   Plantarflexors  5-/5   5-/5   Toe extensors  5-/5   5-/5   Toe flexors  5-/5   5-/5   Tone (Ashworth scale)  0  0   MSRs:  Right        Left                  brachioradialis 2+  2+  biceps 2+  2+  triceps 2+  2+  patellar 2+  2+  ankle jerk 0  0  Hoffman no  no  plantar response down  down   SENSORY: Vibration and temperature is reduced distal to ankles bilaterally. Pin prick is intact.  Rhomberg testing is positive.  COORDINATION/GAIT: Normal finger-to- nose-finger.  Intact rapid alternating movements bilaterally.  Gait is wide-based, assisted with cane, and unsteady  IMPRESSION: 1.  Peripheral neuropathy, age-related.  Diabetes is playing much less of a role  since it is very well-controlled.  Her neurological examination shows a distal predominant peripheral neuropathy. I had extensive discussion with the patient regarding the pathogenesis, etiology, management, and natural course of neuropathy. Neuropathy tends to be slowly progressive and management is symptomatic, unfortunately, there is no curative treatment. She seems to be getting adequate relief of pain with amitriptyline 10mg  in the morning and 20mg  at bedtime. Patient educated on daily foot inspection, fall prevention, and safety precautions around the home.  NCS/EMG was declined.  Start using a rollator, she may prefer a pediatric size as she tells me that her hallways are narrow She had many questions which was answered to the best of my ability  2.  Left lumbar radiculopathy s/p PLIF at L2-L5 (2019, Dr. ).  Her left thigh paresthesias and pain may be stemming from residual lumbar stenosis vs referred pain from her hip.  Physical therapy offered, she would like to do home exercises for low back strengthening which was also recommended by her nephrologist since she has a renal cyst  Return to clinic as needed  Total time spent reviewing records, interview, history/exam, documentation, and coordination of care on day of encounter:  45 min  Thank you for allowing me to participate in patient's care.  If I can answer any additional questions, I would be pleased to do so.    Sincerely,    Bryton Romagnoli K. Posey Pronto, DO

## 2020-01-15 ENCOUNTER — Encounter: Payer: Self-pay | Admitting: Family Medicine

## 2020-01-15 ENCOUNTER — Ambulatory Visit: Payer: Medicare PPO | Admitting: Family Medicine

## 2020-01-15 ENCOUNTER — Other Ambulatory Visit: Payer: Self-pay

## 2020-01-15 VITALS — BP 140/88 | HR 86 | Temp 97.4°F | Wt 158.5 lb

## 2020-01-15 DIAGNOSIS — R300 Dysuria: Secondary | ICD-10-CM

## 2020-01-15 DIAGNOSIS — N3001 Acute cystitis with hematuria: Secondary | ICD-10-CM

## 2020-01-15 LAB — POC URINALSYSI DIPSTICK (AUTOMATED)
Bilirubin, UA: NEGATIVE
Glucose, UA: NEGATIVE
Ketones, UA: NEGATIVE
Nitrite, UA: POSITIVE
Protein, UA: POSITIVE — AB
Spec Grav, UA: >=1.03 — AB (ref 1.010–1.025)
Urobilinogen, UA: NEGATIVE E.U./dL — AB
pH, UA: 6 (ref 5.0–8.0)

## 2020-01-15 MED ORDER — CEPHALEXIN 500 MG PO CAPS: 500.0000 mg | ORAL_CAPSULE | Freq: Two times a day (BID) | ORAL | 0 refills | Status: AC

## 2020-01-15 NOTE — Patient Instructions (Signed)
If you develop fevers, chills, worsening symptoms - seek medical care

## 2020-01-15 NOTE — Progress Notes (Signed)
   Subjective:     Maria [name] is a 78 y.o. female presenting for Dysuria (x 3-4 days ), Urinary Frequency, and Urinary Urgency     HPI   #UTI - pain in the low back - burning with urination  - frequency - difficulty empty - urgency - no fever/chills - has not noticed blood  - did have some nausea    Review of Systems   Social History   Tobacco Use  Smoking Status Never Smoker  Smokeless Tobacco Never Used        Objective:    BP Readings from Last 3 Encounters:  01/15/20 140/88  01/02/20 (!) 162/84  10/06/19 140/74   Wt Readings from Last 3 Encounters:  01/15/20 158 lb 8 oz (71.9 kg)  01/02/20 159 lb (72.1 kg)  09/21/19 170 lb (77.1 kg)    BP 140/88   Pulse 86   Temp (!) 97.4 F (36.3 C) (Temporal)   Wt 158 lb 8 oz (71.9 kg)   SpO2 98%   BMI 22.74 kg/m    Physical Exam Constitutional:      General: She is not in acute distress.    Appearance: She is well-developed. She is not diaphoretic.  HENT:     Right Ear: External ear normal.     Left Ear: External ear normal.     Nose: Nose normal.  Eyes:     Conjunctiva/sclera: Conjunctivae normal.  Cardiovascular:     Rate and Rhythm: Normal rate and regular rhythm.     Heart sounds: No murmur heard.   Pulmonary:     Effort: Pulmonary effort is normal. No respiratory distress.     Breath sounds: Normal breath sounds. No wheezing.  Abdominal:     General: Abdomen is flat. Bowel sounds are normal. There is no distension.     Tenderness: There is no abdominal tenderness. There is no right CVA tenderness or left CVA tenderness.  Musculoskeletal:     Cervical back: Neck supple.  Skin:    General: Skin is warm and dry.     Capillary Refill: Capillary refill takes less than 2 seconds.  Neurological:     Mental Status: She is alert. Mental status is at baseline.  Psychiatric:        Mood and Affect: Mood normal.        Behavior: Behavior normal.     UA: + LE, +nitrities      Assessment  & Plan:   Problem List Items Addressed This Visit      Other   Dysuria   Relevant Orders   POCT Urinalysis Dipstick (Automated) (Completed)    Other Visit Diagnoses    Acute cystitis with hematuria    -  Primary   Relevant Medications   cephALEXin (KEFLEX) 500 MG capsule     Previous UCx pansensitive e coli and responded to cephalexin  Hydration Abx Return precautions and ER precautions discussed  Return if symptoms worsen or fail to improve.  Lynnda Child, MD  This visit occurred during the SARS-CoV-2 public health emergency.  Safety protocols were in place, including screening questions prior to the visit, additional usage of staff PPE, and extensive cleaning of exam room while observing appropriate contact time as indicated for disinfecting solutions.

## 2020-01-27 DIAGNOSIS — G5793 Unspecified mononeuropathy of bilateral lower limbs: Secondary | ICD-10-CM | POA: Diagnosis not present

## 2020-01-27 DIAGNOSIS — M4726 Other spondylosis with radiculopathy, lumbar region: Secondary | ICD-10-CM | POA: Diagnosis not present

## 2020-02-03 DIAGNOSIS — M5116 Intervertebral disc disorders with radiculopathy, lumbar region: Secondary | ICD-10-CM | POA: Diagnosis not present

## 2020-02-03 DIAGNOSIS — M5416 Radiculopathy, lumbar region: Secondary | ICD-10-CM | POA: Diagnosis not present

## 2020-02-04 DIAGNOSIS — R030 Elevated blood-pressure reading, without diagnosis of hypertension: Secondary | ICD-10-CM | POA: Diagnosis not present

## 2020-02-04 DIAGNOSIS — Z9181 History of falling: Secondary | ICD-10-CM | POA: Diagnosis not present

## 2020-02-04 DIAGNOSIS — Z7984 Long term (current) use of oral hypoglycemic drugs: Secondary | ICD-10-CM | POA: Diagnosis not present

## 2020-02-04 DIAGNOSIS — I2510 Atherosclerotic heart disease of native coronary artery without angina pectoris: Secondary | ICD-10-CM | POA: Diagnosis not present

## 2020-02-04 DIAGNOSIS — Z833 Family history of diabetes mellitus: Secondary | ICD-10-CM | POA: Diagnosis not present

## 2020-02-04 DIAGNOSIS — Z8249 Family history of ischemic heart disease and other diseases of the circulatory system: Secondary | ICD-10-CM | POA: Diagnosis not present

## 2020-02-04 DIAGNOSIS — G8929 Other chronic pain: Secondary | ICD-10-CM | POA: Diagnosis not present

## 2020-02-04 DIAGNOSIS — E1142 Type 2 diabetes mellitus with diabetic polyneuropathy: Secondary | ICD-10-CM | POA: Diagnosis not present

## 2020-02-04 DIAGNOSIS — Z7722 Contact with and (suspected) exposure to environmental tobacco smoke (acute) (chronic): Secondary | ICD-10-CM | POA: Diagnosis not present

## 2020-02-17 NOTE — Progress Notes (Deleted)
Date:  02/17/2020   ID:  Maria [name], DOB [birth year], MRN [id number]   PCP:  Joaquim Nam, MD  Cardiologist:  Charlton Haws, MD   Electrophysiologist:  None   Evaluation Performed:  Follow-Up Visit  Chief Complaint:  CAD  History of Present Illness:    Maria [name] is a 79 y.o. female with CAD. DES to circumflex 2016. History of HTN, DM, GERD, HLD intolerant to statins Dyspnea with Repatha. Also hisotry of SVT ablation in 2016 Seen by PA 01/17/18 for preoperative clearance Needed back surgery Myovue done 01/21/18 normal EF 72%  Seen in ER 10/03/18 with "syncope"  She was at Dr Verlee Rossetti office to get spinal injection and had not eaten with lots of back and left leg pain Passed out while going to bathroom BS 61  but she did not eat and had taken her glipizide She was given D50 and saline for ? orthostasis No cardiac issues noted   Also sees neurology for neuropathy Started in September after back surgery Elavil helps her rest   03/31/19 HLD Rx changed to Nexlizet LDL not at goal 115 now improved to 81 09/21/19 fell ? From valium use and poor balance laceration to hand head CT ok 01/15/20 Seen by primary  for UTI Rx with Keflex   ***   Past Medical History:  Diagnosis Date  . Anemia 05/1985  . Carotid artery disease (HCC)    a. 02/2015 - 1-39% bilaterally.  . Chronic back pain   . Concussion   . Coronary artery disease    a. s/p DES to [facility] 01/2015.  . DDD (degenerative disc disease), cervical   . DDD (degenerative disc disease), lumbar   . Fracture of lower leg 07/2001   Right  . GERD (gastroesophageal reflux disease)   . Hyperlipidemia   . Hypertension   . Multinodular thyroid   . Pneumonia ?1990's X 1  . PSVT (paroxysmal supraventricular tachycardia) (HCC)    a. s/p catheter ablation of a parahisian atrial tachycardia which was successfully ablated from the non-coronary cusp of the aortic root 06/2014.  [name] Renal cyst 07/10/2006   bilateral  . Right knee pain 02/2010     Injected - Dr. Shelle Iron  . Sinus pause    a. By event monitoring 09/2014 - BB stopped at that time.  . Syncope and collapse 07/2001   "that's when I broke my leg"  . Type II diabetes mellitus (HCC) 2002   Past Surgical History:  Procedure Laterality Date  . ANTERIOR CERVICAL DECOMP/DISCECTOMY FUSION  10/03/2005   C4/5; C5/6  "it's got a plate in there"  . ANTERIOR LAT LUMBAR FUSION Left 02/04/2018   Procedure: Lumbar two-three Lumbar three-four Lumbar four-five  Anterolateral decompression/percutaneous posterior arthrodesis, Mazor;  Surgeon: Barnett Abu, MD;  [facility]: [facility];  Service: Neurosurgery;  Laterality: Left;  . APPLICATION OF ROBOTIC ASSISTANCE FOR SPINAL PROCEDURE N/A 02/04/2018   Procedure: APPLICATION OF ROBOTIC ASSISTANCE FOR SPINAL PROCEDURE;  Surgeon: Barnett Abu, MD;  Location: [facility] OR;  Service: Neurosurgery;  Laterality: N/A;  . BACK SURGERY    . BREAST BIOPSY Bilateral 1968 (multiple)   "all benign"  . BREAST LUMPECTOMY Bilateral 1968  . CARDIAC CATHETERIZATION N/A 11/18/2014   Procedure: Left Heart Cath and Coronary Angiography;  Surgeon: Messiah Ahr M [place], MD;  Location: [facility] INVASIVE CV LAB;  Service: Cardiovascular;  Laterality: N/A;  . CARDIAC CATHETERIZATION N/A 01/22/2015   Procedure: Coronary Stent Intervention;  Surgeon: Aailyah Dunbar M [place], MD;  Location: [facility] INVASIVE CV LAB;  Service: Cardiovascular;  Laterality: N/A;  . CARDIAC CATHETERIZATION N/A 06/10/2015   Procedure: Left Heart Cath and Coronary Angiography;  Surgeon: Gordon Vandunk M [place], MD;  Location: [facility] INVASIVE CV LAB;  Service: Cardiovascular;  Laterality: N/A;  . CATARACT EXTRACTION W/ INTRAOCULAR LENS  IMPLANT, BILATERAL Bilateral 12/2010  . CORONARY ANGIOPLASTY    . ESOPHAGOGASTRODUODENOSCOPY  08/14/2005   gastropathy biopsy, negative  . LAPAROSCOPIC CHOLECYSTECTOMY  08/1992  . LUMBAR PERCUTANEOUS PEDICLE SCREW 3 LEVEL N/A 02/04/2018   Procedure: LUMBAR PERCUTANEOUS PEDICLE SCREW LUMBAR TWO - LUMBAR FIVE;  Surgeon:  Barnett Abu, MD;  Location[facility];  Service: Neurosurgery;  Laterality: N/A;  . POSTERIOR LAMINECTOMY / DECOMPRESSION LUMBAR SPINE  09/2001   due to herniated disc  . SUPRAVENTRICULAR TACHYCARDIA ABLATION N/A 07/06/2014   Procedure: SUPRAVENTRICULAR TACHYCARDIA ABLATION;  Surgeon: Marinus Maw, MD;  Location: [facility] CATH LAB;  Service: Cardiovascular;  Laterality: N/A;  . VAGINAL HYSTERECTOMY  1968   spotting     No outpatient medications have been marked as taking for the 03/01/20 encounter (Appointment) with Wendall Stade, MD.     Allergies:   Crestor [rosuvastatin], Ezetimibe-simvastatin, Pravastatin, Diazepam, Lyrica [pregabalin], Nsaids, Praluent [alirocumab], Repatha [evolocumab], Acetaminophen, Carvedilol, Gabapentin, Ibuprofen, Insulin glargine, Levemir [insulin detemir], Macrobid [nitrofurantoin macrocrystal], Metformin, Metoprolol tartrate, and Naproxen   Social History   Tobacco Use  . Smoking status: Never Smoker  . Smokeless tobacco: Never Used  Vaping Use  . Vaping Use: Never used  Substance Use Topics  . Alcohol use: No    Alcohol/week: 0.0 standard drinks  . Drug use: No     Family Hx: The patient's family history includes Breast cancer in her maternal aunt; Depression in her paternal aunt; Diabetes in her brother, maternal grandmother, paternal grandfather, paternal grandmother, and sister; Heart disease in her maternal grandfather and mother; Hypertension in her maternal grandfather, maternal grandmother, mother, paternal grandfather, and paternal grandmother; Myasthenia gravis in her sister; Stroke in her maternal grandmother. There is no history of Colon cancer.  ROS:   Please see the history of present illness.     All other systems reviewed and are negative.   Prior CV studies:   The following studies were reviewed today:  Myovue 9/919  Venous Duplex No DVT 03/20/18    Labs/Other Tests and Data Reviewed:    EKG:   NSR no acute changes 10/07/18 chronic  RBBB /LAFB   Recent Labs: 08/14/2019: ALT 11; BUN 19; Creat 1.14; Potassium 4.4; Sodium 142   Recent Lipid Panel Lab Results  Component Value Date/Time   CHOL 143 08/14/2019 02:14 PM   CHOL 188 03/31/2019 08:32 AM   TRIG 108 08/14/2019 02:14 PM   HDL 42 (L) 08/14/2019 02:14 PM   HDL 59 03/31/2019 08:32 AM   CHOLHDL 3.4 08/14/2019 02:14 PM   LDLCALC 81 08/14/2019 02:14 PM   LDLDIRECT 168.1 02/18/2010 07:51 AM    Wt Readings from Last 3 Encounters:  01/15/20 158 lb 8 oz (71.9 kg)  01/02/20 159 lb (72.1 kg)  09/21/19 170 lb (77.1 kg)     Objective:    Vital Signs:  There were no vitals taken for this visit.   Affect appropriate Healthy:  appears stated age HEENT: normal Neck supple with no adenopathy JVP normal no bruits no thyromegaly Lungs clear with no wheezing and good diaphragmatic motion Heart:  S1/S2 no murmur, no rub, gallop or click PMI normal Abdomen: benighn, BS positve, no tenderness, no AAA  no bruit.  No HSM or HJR Distal pulses intact with no bruits No edema Neuro non-focal Skin warm and dry No muscular weakness   ASSESSMENT & PLAN:    1. CAD:  DES to circumflex 2016 normal myovue September 2019 stable 2. Syncope:  Seems vagal related to low BS and pain  3. HLD:  On Nexlizet LDL improved at 81  4. DM:  Discussed low carb diet.  Target hemoglobin A1c is 6.5 or less.  Continue current medications. 5. Back:  Chronic pain f/u Elsner post surgery now with poor balance and neuropathy refer to PT/ vestibular For balance issues  6. RBBB/LAFB:  Chronic yearly ECG not related to her "syncope" has had for years   COVID-19 Education: The signs and symptoms of COVID-19 were discussed with the patient and how to seek care for testing (follow up with PCP or arrange E-visit).  The importance of social distancing was discussed today.  Time:   Today, I have spent 30 minutes with the patient       Medication Adjustments/Labs and Tests Ordered: Current medicines  are reviewed at length with the patient today.  Concerns regarding medicines are outlined above.   Tests Ordered:  ***  Medication Changes: No orders of the defined types were placed in this encounter.   Disposition:  Follow up in 6 months  Signed, Charlton Haws, MD  02/17/2020 5:03 PM    [facility]

## 2020-03-01 ENCOUNTER — Ambulatory Visit: Payer: Medicare PPO | Admitting: Cardiovascular Disease

## 2020-04-23 ENCOUNTER — Encounter: Payer: Self-pay | Admitting: Family Medicine

## 2020-04-23 ENCOUNTER — Ambulatory Visit: Payer: Medicare PPO | Admitting: Family Medicine

## 2020-04-23 ENCOUNTER — Other Ambulatory Visit: Payer: Self-pay

## 2020-04-23 VITALS — BP 150/80 | HR 102 | Temp 96.3°F | Ht 65.0 in | Wt 155.1 lb

## 2020-04-23 DIAGNOSIS — E782 Mixed hyperlipidemia: Secondary | ICD-10-CM | POA: Diagnosis not present

## 2020-04-23 DIAGNOSIS — E538 Deficiency of other specified B group vitamins: Secondary | ICD-10-CM | POA: Diagnosis not present

## 2020-04-23 DIAGNOSIS — G629 Polyneuropathy, unspecified: Secondary | ICD-10-CM

## 2020-04-23 DIAGNOSIS — M5137 Other intervertebral disc degeneration, lumbosacral region: Secondary | ICD-10-CM

## 2020-04-23 DIAGNOSIS — E1159 Type 2 diabetes mellitus with other circulatory complications: Secondary | ICD-10-CM

## 2020-04-23 DIAGNOSIS — R300 Dysuria: Secondary | ICD-10-CM | POA: Diagnosis not present

## 2020-04-23 LAB — BASIC METABOLIC PANEL
BUN: 14 mg/dL (ref 6–23)
CO2: 27 mEq/L (ref 19–32)
Calcium: 9 mg/dL (ref 8.4–10.5)
Chloride: 104 mEq/L (ref 96–112)
Creatinine, Ser: 0.93 mg/dL (ref 0.40–1.20)
GFR: 57.96 mL/min — ABNORMAL LOW (ref >60.00)
Glucose, Bld: 245 mg/dL — ABNORMAL HIGH (ref 70–99)
Potassium: 3.6 mEq/L (ref 3.5–5.1)
Sodium: 141 mEq/L (ref 135–145)

## 2020-04-23 LAB — HEMOGLOBIN A1C: Hgb A1c MFr Bld: 7.2 % — ABNORMAL HIGH (ref 4.6–6.5)

## 2020-04-23 LAB — VITAMIN B12: Vitamin B-12: 238 pg/mL (ref 211–911)

## 2020-04-23 MED ORDER — CEPHALEXIN 500 MG PO CAPS
500.0000 mg | ORAL_CAPSULE | Freq: Two times a day (BID) | ORAL | 0 refills | Status: DC
Start: 2020-04-23 — End: 2020-05-11

## 2020-04-23 MED ORDER — GLYBURIDE 5 MG PO TABS: 5.0000 mg | ORAL_TABLET | Freq: Every day | ORAL | Status: DC

## 2020-04-23 MED ORDER — CYANOCOBALAMIN 1000 MCG/ML IJ SOLN
1000.0000 ug | Freq: Once | INTRAMUSCULAR | Status: AC
  Administered 2020-04-23: 1000 ug via INTRAMUSCULAR

## 2020-04-23 NOTE — Progress Notes (Signed)
This visit occurred during the SARS-CoV-2 public health emergency.  Safety protocols were in place, including screening questions prior to the visit, additional usage of staff PPE, and extensive cleaning of exam room while observing appropriate contact time as indicated for disinfecting solutions.  Due for B12 shot.   Done at office visit.  She is going to follow up with Dr. [name].  She had run out of nexlizet.  She had tolerated that in the past.    Dysuria: yes, burning duration of symptoms: a few weeks.   abdominal pain:no fevers:no vomiting:no  Still with B leg pain and lower back pain during the day.  Better with amitriptyline at night.  Taking advil twice a day.  Using tramadol for pain but not with sig relief.  I told her I would check with the neurosurgery clinic to see if they had other options for pain control at this point.  She is not interested in surgery.  Meds, vitals, and allergies reviewed.   Per HPI unless specifically indicated in ROS section   GEN: nad, alert and oriented HEENT: ncat NECK: supple CV: rrr.  PULM: ctab, no inc wob ABD: soft, +bs, suprapubic area not tender EXT: no edema SKIN: well perfused.  BACK: no CVA pain Dec sensation on the feet compared to the hands B.

## 2020-04-23 NOTE — Patient Instructions (Addendum)
Labs today, then B12 shot.  I want to know if you feel any changes after the B12 shot.   Drink plenty of water and start the antibiotics today after collecting the urine sample.  We'll contact you with your lab report.  Take care.

## 2020-04-25 ENCOUNTER — Telehealth: Payer: Self-pay | Admitting: Family Medicine

## 2020-04-25 NOTE — Assessment & Plan Note (Signed)
She had run out of nexlizet but is going to follow-up with Dr. [name].  I will defer to him.

## 2020-04-25 NOTE — Assessment & Plan Note (Addendum)
Presumed cystitis.  Check labs and then preemptively start Keflex.  Rationale discussed with patient.  Okay for outpatient follow-up.  She will carry a cup home to collect a specimen if she cannot give a urine sample at the office visit today.

## 2020-04-25 NOTE — Assessment & Plan Note (Signed)
Repeat dose done today.  See notes on labs.  I want her to let me know if she feels any difference with repeat B12 dose today.

## 2020-04-25 NOTE — Assessment & Plan Note (Signed)
She still having back pain and I will ask for input from the surgery clinic about options.  I do not know if she be a candidate for injections in her back or any other nonsurgical treatment.

## 2020-04-25 NOTE — Telephone Encounter (Signed)
Please call over to Dr. Verlee Rossetti clinic- the patient still having pain in her legs.  She did not want to have another surgery but I want to know if there is any other nonsurgical options they could offer her at this point.  I thank him for the help.

## 2020-04-25 NOTE — Assessment & Plan Note (Signed)
It could be that her symptoms are related to her back or more likely from a combination of factors, including diabetes and B12 deficiency.  See notes on labs.

## 2020-04-26 ENCOUNTER — Other Ambulatory Visit (INDEPENDENT_AMBULATORY_CARE_PROVIDER_SITE_OTHER): Payer: Medicare PPO

## 2020-04-26 DIAGNOSIS — R300 Dysuria: Secondary | ICD-10-CM

## 2020-04-26 LAB — URINALYSIS, ROUTINE W REFLEX MICROSCOPIC
Bilirubin Urine: NEGATIVE
Hgb urine dipstick: NEGATIVE
Ketones, ur: NEGATIVE
Nitrite: POSITIVE — AB
Specific Gravity, Urine: 1.025 (ref 1.000–1.030)
Total Protein, Urine: NEGATIVE
Urine Glucose: NEGATIVE
Urobilinogen, UA: 1 (ref 0.0–1.0)
pH: 6 (ref 5.0–8.0)

## 2020-04-27 NOTE — Telephone Encounter (Signed)
Called and left voicemail for Dr. Verlee Rossetti CMA to return call regarding this matter. Awaiting call back.

## 2020-04-28 LAB — URINE CULTURE
MICRO NUMBER:: 11309778
SPECIMEN QUALITY:: ADEQUATE

## 2020-05-03 ENCOUNTER — Telehealth: Payer: Self-pay

## 2020-05-03 NOTE — Telephone Encounter (Signed)
I have reached out to Dr.Elsner office for his CMA/Nurse to give the office a cb regarding this pt. I let them know that we need Dr. Verlee Rossetti input with this pt bc she is still having pain in her legs and do not wish to have surgery.  Awaiting for cb.

## 2020-05-11 ENCOUNTER — Telehealth: Payer: Self-pay | Admitting: Family Medicine

## 2020-05-11 ENCOUNTER — Telehealth: Payer: Self-pay

## 2020-05-11 MED ORDER — SULFAMETHOXAZOLE-TRIMETHOPRIM 800-160 MG PO TABS
1.0000 | ORAL_TABLET | Freq: Two times a day (BID) | ORAL | 0 refills | Status: DC
Start: 2020-05-11 — End: 2020-08-16

## 2020-05-11 NOTE — Telephone Encounter (Signed)
We can change to Septra, prescription sent, but this is with the presumption that her previous culture data would still apply.  If she not getting better with this or if she is feeling worse in the meantime then she does need to get rechecked.  Thanks.

## 2020-05-11 NOTE — Telephone Encounter (Signed)
Spoke with pt to let her know that Dr. Para March sent her in a new Rx for Dysuria and that if she is not getting any better or getting worse then she will need to come in to be rechecked. Pt understood.  Thank you,  Christy Gentles

## 2020-05-11 NOTE — Telephone Encounter (Signed)
Please Advise

## 2020-05-11 NOTE — Addendum Note (Signed)
Addended by: Joaquim Nam on: 05/11/2020 02:27 PM   Modules accepted: Orders

## 2020-05-11 NOTE — Telephone Encounter (Signed)
Patient called in stating that she still is having painful urination and smell to her urine but not as bad. Patient refused appt when offered stating that it is hard for her to walk. Patient is requesting another antibiotic. Please advise patient. EM

## 2020-05-25 NOTE — Progress Notes (Incomplete)
Date:  05/25/2020   ID:  Maria [name], DOB Sep 29, 1940, MRN [id number]  Provider Location: Office  PCP:  Joaquim Nam, MD  Cardiologist:  Charlton Haws, MD   Electrophysiologist:  None   Evaluation Performed:  Follow-Up Visit  Chief Complaint:  CAD  History of Present Illness:    Maria [name] is a 80 y.o. female with CAD. DES to circumflex 2016. History of HTN, DM, GERD, HLD intolerant to statins Dyspnea with Repatha. Also hisotry of SVT ablation in 2016 Myovue done 01/21/18 normal EF 72%  Seen in ER 10/03/18 with "syncope"  She was at Dr Verlee Rossetti office to get spinal injection and had not eaten with lots of back and left leg pain Passed out while going to bathroom BS 61  but she did not eat and had taken her glipizide She was given D50 and saline for ? orthostasis No cardiac issues noted   Chronic back pain and LE neuropathy biggest quality of life issue Not improved with surgery done September 2021 Balance very poor   Lab review shows A1c jumped to 7/2 04/23/20  LDL was 81 08/14/19   ***   Past Medical History:  Diagnosis Date  . Anemia 05/1985  . Carotid artery disease (HCC)    a. 02/2015 - 1-39% bilaterally.  . Chronic back pain   . Concussion   . Coronary artery disease    a. s/p DES to [facility] 01/2015.  . DDD (degenerative disc disease), cervical   . DDD (degenerative disc disease), lumbar   . Fracture of lower leg 07/2001   Right  . GERD (gastroesophageal reflux disease)   . Hyperlipidemia   . Hypertension   . Multinodular thyroid   . Pneumonia ?1990's X 1  . PSVT (paroxysmal supraventricular tachycardia) (HCC)    a. s/p catheter ablation of a parahisian atrial tachycardia which was successfully ablated from the non-coronary cusp of the aortic root 06/2014.  [name] Renal cyst 07/10/2006   bilateral  . Right knee pain 02/2010   Injected - Dr. Shelle Iron  . Sinus pause    a. By event monitoring 09/2014 - BB stopped at that time.  . Syncope and collapse 07/2001   "that's  when I broke my leg"  . Type II diabetes mellitus (HCC) 2002   Past Surgical History:  Procedure Laterality Date  . ANTERIOR CERVICAL DECOMP/DISCECTOMY FUSION  10/03/2005   C4/5; C5/6  "it's got a plate in there"  . ANTERIOR LAT LUMBAR FUSION Left 02/04/2018   Procedure: Lumbar two-three Lumbar three-four Lumbar four-five  Anterolateral decompression/percutaneous posterior arthrodesis, Mazor;  Surgeon: Barnett Abu, MD;  Location: [facility] OR;  Service: Neurosurgery;  Laterality: Left;  . APPLICATION OF ROBOTIC ASSISTANCE FOR SPINAL PROCEDURE N/A 02/04/2018   Procedure: APPLICATION OF ROBOTIC ASSISTANCE FOR SPINAL PROCEDURE;  Surgeon: Barnett Abu, MD;  Location: [facility] OR;  Service: Neurosurgery;  Laterality: N/A;  . BACK SURGERY    . BREAST BIOPSY Bilateral 1968 (multiple)   "all benign"  . BREAST LUMPECTOMY Bilateral 1968  . CARDIAC CATHETERIZATION N/A 11/18/2014   Procedure: Left Heart Cath and Coronary Angiography;  Surgeon: Teila Skalsky M [place], MD;  Location: [facility] INVASIVE CV LAB;  Service: Cardiovascular;  Laterality: N/A;  . CARDIAC CATHETERIZATION N/A 01/22/2015   Procedure: Coronary Stent Intervention;  Surgeon: Marlisa Caridi M [place], MD;  Location[facility] INVASIVE CV LAB;  Service: Cardiovascular;  Laterality: N/A;  . CARDIAC CATHETERIZATION N/A 06/10/2015   Procedure: Left Heart Cath and Coronary Angiography;  Surgeon: [name]  M [place], MD;  Location: [facility] INVASIVE CV LAB;  Service: Cardiovascular;  Laterality: N/A;  . CATARACT EXTRACTION W/ INTRAOCULAR LENS  IMPLANT, BILATERAL Bilateral 12/2010  . CORONARY ANGIOPLASTY    . ESOPHAGOGASTRODUODENOSCOPY  08/14/2005   gastropathy biopsy, negative  . LAPAROSCOPIC CHOLECYSTECTOMY  08/1992  . LUMBAR PERCUTANEOUS PEDICLE SCREW 3 LEVEL N/A 02/04/2018   Procedure: LUMBAR PERCUTANEOUS PEDICLE SCREW LUMBAR TWO - LUMBAR FIVE;  Surgeon: Barnett Abu, MD;  Location: [facility] OR;  Service: Neurosurgery;  Laterality: N/A;  . POSTERIOR LAMINECTOMY / DECOMPRESSION LUMBAR SPINE  09/2001    due to herniated disc  . SUPRAVENTRICULAR TACHYCARDIA ABLATION N/A 07/06/2014   Procedure: SUPRAVENTRICULAR TACHYCARDIA ABLATION;  Surgeon: Marinus Maw, MD;  Location: [facility] CATH LAB;  Service: Cardiovascular;  Laterality: N/A;  . VAGINAL HYSTERECTOMY  1968   spotting     No outpatient medications have been marked as taking for the 05/28/20 encounter (Appointment) with Wendall Stade, MD.     Allergies:   Crestor [rosuvastatin], Ezetimibe-simvastatin, Pravastatin, Diazepam, Lyrica [pregabalin], Nsaids, Praluent [alirocumab], Repatha [evolocumab], Acetaminophen, Carvedilol, Gabapentin, Ibuprofen, Insulin glargine, Levemir [insulin detemir], Macrobid [nitrofurantoin macrocrystal], Metformin, Metoprolol tartrate, and Naproxen   Social History   Tobacco Use  . Smoking status: Never Smoker  . Smokeless tobacco: Never Used  Vaping Use  . Vaping Use: Never used  Substance Use Topics  . Alcohol use: No    Alcohol/week: 0.0 standard drinks  . Drug use: No     Family Hx: The patient's family history includes Breast cancer in her maternal aunt; Depression in her paternal aunt; Diabetes in her brother, maternal grandmother, paternal grandfather, paternal grandmother, and sister; Heart disease in her maternal grandfather and mother; Hypertension in her maternal grandfather, maternal grandmother, mother, paternal grandfather, and paternal grandmother; Myasthenia gravis in her sister; Stroke in her maternal grandmother. There is no history of Colon cancer.  ROS:   Please see the history of present illness.     All other systems reviewed and are negative.   Prior CV studies:   The following studies were reviewed today:  Myovue 9/919  Venous Duplex No DVT 03/20/18    Labs/Other Tests and Data Reviewed:    EKG:   NSR no acute changes 10/07/18 chronic RBBB /LAFB   Recent Labs: 08/14/2019: ALT 11 04/23/2020: BUN 14; Creatinine, Ser 0.93; Potassium 3.6; Sodium 141   Recent Lipid Panel Lab  Results  Component Value Date/Time   CHOL 143 08/14/2019 02:14 PM   CHOL 188 03/31/2019 08:32 AM   TRIG 108 08/14/2019 02:14 PM   HDL 42 (L) 08/14/2019 02:14 PM   HDL 59 03/31/2019 08:32 AM   CHOLHDL 3.4 08/14/2019 02:14 PM   LDLCALC 81 08/14/2019 02:14 PM   LDLDIRECT 168.1 02/18/2010 07:51 AM    Wt Readings from Last 3 Encounters:  04/23/20 70.4 kg  01/15/20 71.9 kg  01/02/20 72.1 kg     Objective:    Vital Signs:  There were no vitals taken for this visit.   Affect appropriate Healthy:  appears stated age HEENT: normal Neck supple with no adenopathy JVP normal no bruits no thyromegaly Lungs clear with no wheezing and good diaphragmatic motion Heart:  S1/S2 no murmur, no rub, gallop or click PMI normal Abdomen: benighn, BS positve, no tenderness, no AAA no bruit.  No HSM or HJR Distal pulses intact with no bruits No edema Neuro non-focal Skin warm and dry No muscular weakness   ASSESSMENT & PLAN:    1. CAD:  DES to circumflex 2016 normal myovue September 2019 stable 2. Syncope:  Seems vagal related to low BS and pain  3. HLD:  On Nexlizet labs with primary  4. DM:  Discussed low carb diet.  Target hemoglobin A1c is 6.5 or less.  Continue current medications. 5. Back:  Chronic pain f/u Elsner post surgery now with poor balance and neuropathy refer to PT/ vestibular For balance issues  6. RBBB/LAFB:  Chronic yearly ECG not related to her "syncope" has had for years   COVID-19 Education: The signs and symptoms of COVID-19 were discussed with the patient and how to seek care for testing (follow up with PCP or arrange E-visit).  The importance of social distancing was discussed today.  Time:   Today, I have spent 30 minutes with the patient       Medication Adjustments/Labs and Tests Ordered: Current medicines are reviewed at length with the patient today.  Concerns regarding medicines are outlined above.   Tests Ordered:  ***  Medication Changes: No orders  of the defined types were placed in this encounter.   Disposition:  Follow up in 6 months  Signed, Charlton Haws, MD  05/25/2020 12:21 PM    [facility]

## 2020-05-28 ENCOUNTER — Ambulatory Visit: Payer: Medicare PPO | Admitting: Cardiovascular Disease

## 2020-06-02 DIAGNOSIS — R030 Elevated blood-pressure reading, without diagnosis of hypertension: Secondary | ICD-10-CM | POA: Diagnosis not present

## 2020-06-02 DIAGNOSIS — M48062 Spinal stenosis, lumbar region with neurogenic claudication: Secondary | ICD-10-CM | POA: Diagnosis not present

## 2020-06-03 ENCOUNTER — Other Ambulatory Visit: Payer: Self-pay | Admitting: Family Medicine

## 2020-06-04 NOTE — Telephone Encounter (Signed)
Pharmacy requests refill on: Tramadol 50 mg   LAST REFILL: 06/18/2018 LAST OV: 04/23/2020 NEXT OV: Not Scheduled  PHARMACY: [facility]

## 2020-06-06 NOTE — Telephone Encounter (Signed)
Sent. Thanks.   

## 2020-06-10 DIAGNOSIS — M5415 Radiculopathy, thoracolumbar region: Secondary | ICD-10-CM | POA: Diagnosis not present

## 2020-06-10 DIAGNOSIS — M5115 Intervertebral disc disorders with radiculopathy, thoracolumbar region: Secondary | ICD-10-CM | POA: Diagnosis not present

## 2020-06-22 ENCOUNTER — Other Ambulatory Visit: Payer: Self-pay | Admitting: Family Medicine

## 2020-06-22 NOTE — Telephone Encounter (Signed)
Pharmacy requests refill on: Amitriptyline 10 mg   LAST REFILL: 08/22/2019 LAST OV: 04/23/2020 NEXT OV: Not Scheduled  PHARMACY: [facility]

## 2020-06-23 NOTE — Telephone Encounter (Signed)
Sent. Thanks.   

## 2020-07-13 ENCOUNTER — Ambulatory Visit: Payer: Medicare PPO | Admitting: Family Medicine

## 2020-07-16 ENCOUNTER — Other Ambulatory Visit: Payer: Self-pay

## 2020-07-16 ENCOUNTER — Encounter: Payer: Self-pay | Admitting: Family Medicine

## 2020-07-16 ENCOUNTER — Ambulatory Visit: Payer: Medicare PPO | Admitting: Family Medicine

## 2020-07-16 VITALS — BP 126/78 | HR 90 | Temp 97.4°F | Ht 65.0 in | Wt 153.0 lb

## 2020-07-16 DIAGNOSIS — M51379 Other intervertebral disc degeneration, lumbosacral region without mention of lumbar back pain or lower extremity pain: Secondary | ICD-10-CM

## 2020-07-16 DIAGNOSIS — M5137 Other intervertebral disc degeneration, lumbosacral region: Secondary | ICD-10-CM

## 2020-07-16 DIAGNOSIS — M549 Dorsalgia, unspecified: Secondary | ICD-10-CM | POA: Diagnosis not present

## 2020-07-16 DIAGNOSIS — E538 Deficiency of other specified B group vitamins: Secondary | ICD-10-CM | POA: Diagnosis not present

## 2020-07-16 LAB — BASIC METABOLIC PANEL
BUN: 13 mg/dL (ref 6–23)
CO2: 27 mEq/L (ref 19–32)
Calcium: 9.4 mg/dL (ref 8.4–10.5)
Chloride: 108 mEq/L (ref 96–112)
Creatinine, Ser: 0.9 mg/dL (ref 0.40–1.20)
GFR: 60.19 mL/min (ref >60.00)
Glucose, Bld: 260 mg/dL — ABNORMAL HIGH (ref 70–99)
Potassium: 3.9 mEq/L (ref 3.5–5.1)
Sodium: 143 mEq/L (ref 135–145)

## 2020-07-16 MED ORDER — CYANOCOBALAMIN 1000 MCG/ML IJ SOLN
1000.0000 ug | Freq: Once | INTRAMUSCULAR | Status: AC
  Administered 2020-07-16: 1000 ug via INTRAMUSCULAR

## 2020-07-16 NOTE — Patient Instructions (Addendum)
Go to the lab on the way out.   If you have mychart we'll likely use that to update you.    Take care.  Glad to see you. B12 shot today.  Need repeat B12 shot every 2 weeks.    See if you feel any better after the B12 shot today.

## 2020-07-16 NOTE — Progress Notes (Signed)
This visit occurred during the SARS-CoV-2 public health emergency.  Safety protocols were in place, including screening questions prior to the visit, additional usage of staff PPE, and extensive cleaning of exam room while observing appropriate contact time as indicated for disinfecting solutions.  Leg pain and aches.  Ongoing.  H/o B12 def with most recent level >200.  No recent B12 injection done.  B12 to be done today.    She feel/hears a popping sensation in her back.  L side if back is sore.  She is going to f/u with Dr. Danielle Dess.  She still has B leg pain, attributed to her back.  Walking with a walker.    She quit amitriptyline due to leg shaking.  That is better off amitriptyline.    She is getting by with advil 600mg  TID with food.  She has tolerated that.  She noted advil helped more than tramadol.     Meds, vitals, and allergies reviewed.   ROS: Per HPI unless specifically indicated in ROS section   nad ncat Neck supple, no lymphadenopathy rrr ctab abd soft, not ttp R lower back and midline back ttp.   Able to bear weight.  Diabetic foot exam: Normal inspection No skin breakdown No calluses  Normal DP pulses Decreased sensation to monofilament bilaterally.   Nails normal  Strength intact for the bilateral lower extremities on gross exam.  30 minutes were devoted to patient care in this encounter (this includes time spent reviewing the patient's file/history, interviewing and examining the patient, counseling/reviewing plan with patient).

## 2020-07-17 NOTE — Assessment & Plan Note (Signed)
With neuropathy symptoms.  Discussed options.  Repeat labs today. B12 shot today.  Need repeat B12 shot every 2 weeks.    I want her to see if she feels any better after the B12 shot today and update [name].  Need BMET then MRI with and without contrast L spine.   We will send a copy of this note to update Dr. Danielle Dess. I greatly appreciate his input.

## 2020-07-17 NOTE — Assessment & Plan Note (Signed)
See backache discussion.

## 2020-08-02 DIAGNOSIS — L814 Other melanin hyperpigmentation: Secondary | ICD-10-CM | POA: Diagnosis not present

## 2020-08-02 DIAGNOSIS — L821 Other seborrheic keratosis: Secondary | ICD-10-CM | POA: Diagnosis not present

## 2020-08-03 ENCOUNTER — Ambulatory Visit: Payer: Medicare PPO

## 2020-08-03 ENCOUNTER — Other Ambulatory Visit: Payer: Self-pay

## 2020-08-04 DIAGNOSIS — M48062 Spinal stenosis, lumbar region with neurogenic claudication: Secondary | ICD-10-CM | POA: Diagnosis not present

## 2020-08-04 DIAGNOSIS — R030 Elevated blood-pressure reading, without diagnosis of hypertension: Secondary | ICD-10-CM | POA: Diagnosis not present

## 2020-08-05 ENCOUNTER — Other Ambulatory Visit: Payer: Self-pay | Admitting: Cardiovascular Disease

## 2020-08-09 ENCOUNTER — Other Ambulatory Visit: Payer: Self-pay | Admitting: Family Medicine

## 2020-08-10 NOTE — Progress Notes (Signed)
Date:  08/16/2020   ID:  Maria [name], DOB [birth year], MRN [id number]  Provider Location: Office  PCP:  Joaquim Nam, MD  Cardiologist:  Charlton Haws, MD   Electrophysiologist:  None   Evaluation Performed:  Follow-Up Visit  Chief Complaint:  CAD  History of Present Illness:    Maria [name] is a 80 y.o. female with CAD. DES to circumflex 2016. History of HTN, DM, GERD, HLD intolerant to statins Dyspnea with Repatha. Also hisotry of SVT ablation in 2016 Last seen by PA 01/17/18 for preoperative clearance Needed back surgery Myovue done 01/21/18 normal EF 72%  Seen in ER 10/03/18 with "syncope"  She was at Dr Verlee Rossetti office to get spinal injection and had not eaten with lots of back and left leg pain Passed out while going to bathroom BS 61  but she did not eat and had taken her glipizide She was given D50 and saline for ? orthostasis No cardiac issues noted   Seeing neurology for neuropathy Started in September after back surgery Elavil stopped due to leg shaking   03/31/19 HLD Rx changed to Nexlizet LDL not at goal 115 prior to this on 03/31/19   Had back surgery in September 2021  with poor balance and neuropathy since then  Also with mild dependent edema in left ankle/foot only   Past Medical History:  Diagnosis Date  . Anemia 05/1985  . Carotid artery disease (HCC)    a. 02/2015 - 1-39% bilaterally.  . Chronic back pain   . Concussion   . Coronary artery disease    a. s/p DES to [facility] 01/2015.  . DDD (degenerative disc disease), cervical   . DDD (degenerative disc disease), lumbar   . Fracture of lower leg 07/2001   Right  . GERD (gastroesophageal reflux disease)   . Hyperlipidemia   . Hypertension   . Multinodular thyroid   . Pneumonia ?1990's X 1  . PSVT (paroxysmal supraventricular tachycardia) (HCC)    a. s/p catheter ablation of a parahisian atrial tachycardia which was successfully ablated from the non-coronary cusp of the aortic root 06/2014.  [name] Renal cyst  07/10/2006   bilateral  . Right knee pain 02/2010   Injected - Dr. Shelle Iron  . Sinus pause    a. By event monitoring 09/2014 - BB stopped at that time.  . Syncope and collapse 07/2001   "that's when I broke my leg"  . Type II diabetes mellitus (HCC) 2002   Past Surgical History:  Procedure Laterality Date  . ANTERIOR CERVICAL DECOMP/DISCECTOMY FUSION  10/03/2005   C4/5; C5/6  "it's got a plate in there"  . ANTERIOR LAT LUMBAR FUSION Left 02/04/2018   Procedure: Lumbar two-three Lumbar three-four Lumbar four-five  Anterolateral decompression/percutaneous posterior arthrodesis, Mazor;  Surgeon: Barnett Abu, MD;  Location: [facility] OR;  Service: Neurosurgery;  Laterality: Left;  . APPLICATION OF ROBOTIC ASSISTANCE FOR SPINAL PROCEDURE N/A 02/04/2018   Procedure: APPLICATION OF ROBOTIC ASSISTANCE FOR SPINAL PROCEDURE;  Surgeon: Barnett Abu, MD;  Location: [facility] OR;  Service: Neurosurgery;  Laterality: N/A;  . BACK SURGERY    . BREAST BIOPSY Bilateral 1968 (multiple)   "all benign"  . BREAST LUMPECTOMY Bilateral 1968  . CARDIAC CATHETERIZATION N/A 11/18/2014   Procedure: Left Heart Cath and Coronary Angiography;  Surgeon: Jaelyn Bourgoin M [place], MD;  Location: [facility] INVASIVE CV LAB;  Service: Cardiovascular;  Laterality: N/A;  . CARDIAC CATHETERIZATION N/A 01/22/2015   Procedure: Coronary Stent Intervention;  Surgeon: Demetria Pore  [place], MD;  Location: [facility] INVASIVE CV LAB;  Service: Cardiovascular;  Laterality: N/A;  . CARDIAC CATHETERIZATION N/A 06/10/2015   Procedure: Left Heart Cath and Coronary Angiography;  Surgeon: Chuckie Mccathern M [place], MD;  Location: [facility] INVASIVE CV LAB;  Service: Cardiovascular;  Laterality: N/A;  . CATARACT EXTRACTION W/ INTRAOCULAR LENS  IMPLANT, BILATERAL Bilateral 12/2010  . CORONARY ANGIOPLASTY    . ESOPHAGOGASTRODUODENOSCOPY  08/14/2005   gastropathy biopsy, negative  . LAPAROSCOPIC CHOLECYSTECTOMY  08/1992  . LUMBAR PERCUTANEOUS PEDICLE SCREW 3 LEVEL N/A 02/04/2018   Procedure: LUMBAR PERCUTANEOUS  PEDICLE SCREW LUMBAR TWO - LUMBAR FIVE;  Surgeon: Barnett Abu, MD;  Location: [facility] OR;  Service: Neurosurgery;  Laterality: N/A;  . POSTERIOR LAMINECTOMY / DECOMPRESSION LUMBAR SPINE  09/2001   due to herniated disc  . SUPRAVENTRICULAR TACHYCARDIA ABLATION N/A 07/06/2014   Procedure: SUPRAVENTRICULAR TACHYCARDIA ABLATION;  Surgeon: Marinus Maw, MD;  [facility] LAB;  Service: Cardiovascular;  Laterality: N/A;  . VAGINAL HYSTERECTOMY  1968   spotting     Current Meds  Medication Sig  . aspirin EC 81 MG tablet Take 81 mg by mouth once.   [name] atenolol (TENORMIN) 25 MG tablet Take 0.5 tablets (12.5 mg total) by mouth daily.  . Bempedoic Acid-Ezetimibe (NEXLIZET) 180-10 MG TABS Take 1 tablet by mouth daily. Keep upcoming appointment for further refills  . cyanocobalamin (,VITAMIN B-12,) 1000 MCG/ML injection 1000 mcg injected IM every 14 days.  [name] glyBURIDE (DIABETA) 5 MG tablet Take 1 tablet (5 mg total) by mouth daily.  [name] loperamide (IMODIUM) 2 MG capsule Take 2 mg by mouth 4 (four) times daily as needed for diarrhea or loose stools.   . nitroGLYCERIN (NITROSTAT) 0.4 MG SL tablet PLACE 1 TABLET UNDER THE TONGUE EVERY 5 MINUTES AS NEEDED FOR CHEST PAIN FOR UP TO 3 DOSES  . omeprazole (PRILOSEC) 20 MG capsule Take 1 capsule (20 mg total) by mouth 2 (two) times daily as needed (for acid reflux/indigestion.).  [name] ONETOUCH ULTRA test strip USE AS INSTRUCTED TO TEST BLOOD SUGAR THREE TIMES DAILY.  . traMADol (ULTRAM) 50 MG tablet TAKE 1 TABLET BY MOUTH EVERY 6 HOURS AS NEEDED     Allergies:   Crestor [rosuvastatin], Ezetimibe-simvastatin, Pravastatin, Diazepam, Ezetimibe, Lyrica [pregabalin], Nsaids, Praluent [alirocumab], Repatha [evolocumab], Acetaminophen, Carvedilol, Gabapentin, Ibuprofen, Insulin glargine, Levemir [insulin detemir], Macrobid [nitrofurantoin macrocrystal], Metformin, Metoprolol tartrate, and Naproxen   Social History   Tobacco Use  . Smoking status: Never Smoker  .  Smokeless tobacco: Never Used  Vaping Use  . Vaping Use: Never used  Substance Use Topics  . Alcohol use: No    Alcohol/week: 0.0 standard drinks  . Drug use: No     Family Hx: The patient's family history includes Breast cancer in her maternal aunt; Depression in her paternal aunt; Diabetes in her brother, maternal grandmother, paternal grandfather, paternal grandmother, and sister; Heart disease in her maternal grandfather and mother; Hypertension in her maternal grandfather, maternal grandmother, mother, paternal grandfather, and paternal grandmother; Myasthenia gravis in her sister; Stroke in her maternal grandmother. There is no history of Colon cancer.  ROS:   Please see the history of present illness.     All other systems reviewed and are negative.   Prior CV studies:   The following studies were reviewed today:  Myovue 9/919  Venous Duplex No DVT 03/20/18    Labs/Other Tests and Data Reviewed:    EKG:   NSR no acute changes 10/07/18 chronic RBBB /LAFB 08/16/2020 ST  rate 103 RBBB LAFB  Recent Labs: 07/16/2020: BUN 13; Creatinine, Ser 0.90; Potassium 3.9; Sodium 143   Recent Lipid Panel Lab Results  Component Value Date/Time   CHOL 143 08/14/2019 02:14 PM   CHOL 188 03/31/2019 08:32 AM   TRIG 108 08/14/2019 02:14 PM   HDL 42 (L) 08/14/2019 02:14 PM   HDL 59 03/31/2019 08:32 AM   CHOLHDL 3.4 08/14/2019 02:14 PM   LDLCALC 81 08/14/2019 02:14 PM   LDLDIRECT 168.1 02/18/2010 07:51 AM    Wt Readings from Last 3 Encounters:  08/16/20 69.9 kg  07/16/20 69.4 kg  04/23/20 70.4 kg     Objective:    Vital Signs:  BP (!) 142/70   Pulse (!) 103   Ht 5\' 5"  (1.651 m)   Wt 69.9 kg   SpO2 97%   BMI 25.63 kg/m    Affect appropriate Healthy:  appears stated age HEENT: normal Neck supple with no adenopathy JVP normal no bruits no thyromegaly Lungs clear with no wheezing and good diaphragmatic motion Heart:  S1/S2 no murmur, no rub, gallop or click PMI normal Abdomen:  benighn, BS positve, no tenderness, no AAA no bruit.  No HSM or HJR Distal pulses intact with no bruits No edema Neuro non-focal Skin warm and dry No muscular weakness   ASSESSMENT & PLAN:    1. CAD:  DES to circumflex 2016 normal myovue September 2019 stable 2. Syncope:  Seems vagal related to low BS and pain  3. HLD:  On Nexlizet now f/u labs ordered  4. DM:  Discussed low carb diet.  Target hemoglobin A1c is 6.5 or less.  Continue current medications. 5. Back:  Chronic pain f/u Elsner post surgery now with poor balance and neuropathy refer to PT/ vestibular For balance issues  6. RBBB/LAFB:  Chronic yearly ECG not related to her "syncope" has had for years   COVID-19 Education: The signs and symptoms of COVID-19 were discussed with the patient and how to seek care for testing (follow up with PCP or arrange E-visit).  The importance of social distancing was discussed today.  Time:   Today, I have spent 30 minutes with the patient       Medication Adjustments/Labs and Tests Ordered: Current medicines are reviewed at length with the patient today.  Concerns regarding medicines are outlined above.   Tests Ordered:  Lipid and liver   Medication Changes: No orders of the defined types were placed in this encounter.   Disposition:  Follow up in 6 months  Signed, October 2019, MD  08/16/2020 9:11 AM    [facility]

## 2020-08-11 ENCOUNTER — Other Ambulatory Visit: Payer: Self-pay

## 2020-08-11 ENCOUNTER — Ambulatory Visit
Admission: RE | Admit: 2020-08-11 | Discharge: 2020-08-11 | Disposition: A | Discharge status: Home / Self Care | Payer: Medicare PPO | Source: Ambulatory Visit | Attending: Family Medicine | Admitting: Family Medicine

## 2020-08-11 ENCOUNTER — Ambulatory Visit (INDEPENDENT_AMBULATORY_CARE_PROVIDER_SITE_OTHER): Payer: Medicare PPO

## 2020-08-11 DIAGNOSIS — E538 Deficiency of other specified B group vitamins: Secondary | ICD-10-CM

## 2020-08-11 DIAGNOSIS — M5135 Other intervertebral disc degeneration, thoracolumbar region: Secondary | ICD-10-CM | POA: Diagnosis not present

## 2020-08-11 DIAGNOSIS — M48061 Spinal stenosis, lumbar region without neurogenic claudication: Secondary | ICD-10-CM | POA: Diagnosis not present

## 2020-08-11 DIAGNOSIS — M4319 Spondylolisthesis, multiple sites in spine: Secondary | ICD-10-CM | POA: Diagnosis not present

## 2020-08-11 DIAGNOSIS — M5136 Other intervertebral disc degeneration, lumbar region: Secondary | ICD-10-CM | POA: Diagnosis not present

## 2020-08-11 DIAGNOSIS — M549 Dorsalgia, unspecified: Secondary | ICD-10-CM

## 2020-08-11 MED ORDER — GADOBENATE DIMEGLUMINE 529 MG/ML IV SOLN
14.0000 mL | Freq: Once | INTRAVENOUS | Status: AC | PRN
  Administered 2020-08-11: 14 mL via INTRAVENOUS

## 2020-08-11 MED ORDER — CYANOCOBALAMIN 1000 MCG/ML IJ SOLN
1000.0000 ug | Freq: Once | INTRAMUSCULAR | Status: AC
  Administered 2020-08-11: 1000 ug via INTRAMUSCULAR

## 2020-08-11 NOTE — Progress Notes (Signed)
Per orders of Dr. Eustaquio Boyden, (in absence of Dr. Para March) injection of b-12 given by Donnamarie Poag in right deltoid. Patient tolerated injection well. Patient will make appointment for 2 week. Per last note patient was asked if noticed any improvement in leg pain. Patient has not noticed any change but will let us know at next injection.

## 2020-08-13 ENCOUNTER — Other Ambulatory Visit: Payer: Medicare PPO

## 2020-08-13 DIAGNOSIS — M48062 Spinal stenosis, lumbar region with neurogenic claudication: Secondary | ICD-10-CM | POA: Diagnosis not present

## 2020-08-16 ENCOUNTER — Ambulatory Visit: Payer: Medicare PPO | Admitting: Cardiovascular Disease

## 2020-08-16 ENCOUNTER — Other Ambulatory Visit: Payer: Self-pay

## 2020-08-16 ENCOUNTER — Encounter: Payer: Self-pay | Admitting: Cardiovascular Disease

## 2020-08-16 VITALS — BP 142/70 | HR 103 | Ht 65.0 in | Wt 154.0 lb

## 2020-08-16 DIAGNOSIS — I2510 Atherosclerotic heart disease of native coronary artery without angina pectoris: Secondary | ICD-10-CM | POA: Diagnosis not present

## 2020-08-16 DIAGNOSIS — Z79899 Other long term (current) drug therapy: Secondary | ICD-10-CM

## 2020-08-16 DIAGNOSIS — E782 Mixed hyperlipidemia: Secondary | ICD-10-CM

## 2020-08-16 MED ORDER — NEXLIZET 180-10 MG PO TABS: 1.0000 | ORAL_TABLET | Freq: Every day | ORAL | 3 refills | Status: DC

## 2020-08-16 NOTE — Patient Instructions (Signed)
Medication Instructions:    *If you need a refill on your cardiac medications before your next appointment, please call your pharmacy*   Lab Work:   If you have labs (blood work) drawn today and your tests are completely normal, you will receive your results only by: [name] MyChart Message (if you have MyChart) OR . A paper copy in the mail If you have any lab test that is abnormal or we need to change your treatment, we will call you to review the results.   Testing/Procedures: none   Follow-Up: At [facility], you and your health needs are our priority.  As part of our continuing mission to provide you with exceptional heart care, we have created designated Provider Care Teams.  These Care Teams include your primary Cardiologist (physician) and Advanced Practice Providers (APPs -  Physician Assistants and Nurse Practitioners) who all work together to provide you with the care you need, when you need it.  We recommend signing up for the patient portal called "MyChart".  Sign up information is provided on this After Visit Summary.  MyChart is used to connect with patients for Virtual Visits (Telemedicine).  Patients are able to view lab/test results, encounter notes, upcoming appointments, etc.  Non-urgent messages can be sent to your provider as well.   To learn more about what you can do with MyChart, go to [email or web address].    Your next appointment:   12 month(s)  The format for your next appointment:   In Person  Provider:   Charlton Haws, MD   Other Instructions

## 2020-08-18 ENCOUNTER — Encounter (HOSPITAL_COMMUNITY): Payer: Self-pay

## 2020-08-18 ENCOUNTER — Observation Stay (HOSPITAL_COMMUNITY)
Admission: EM | Admit: 2020-08-18 | Discharge: 2020-08-20 | Disposition: A | Discharge status: Home / Self Care | Payer: Medicare PPO | Attending: Internal Medicine | Admitting: Internal Medicine

## 2020-08-18 ENCOUNTER — Emergency Department (HOSPITAL_COMMUNITY): Payer: Medicare PPO

## 2020-08-18 ENCOUNTER — Other Ambulatory Visit: Payer: Self-pay

## 2020-08-18 DIAGNOSIS — G319 Degenerative disease of nervous system, unspecified: Secondary | ICD-10-CM | POA: Diagnosis not present

## 2020-08-18 DIAGNOSIS — E1165 Type 2 diabetes mellitus with hyperglycemia: Secondary | ICD-10-CM | POA: Insufficient documentation

## 2020-08-18 DIAGNOSIS — R739 Hyperglycemia, unspecified: Secondary | ICD-10-CM | POA: Diagnosis not present

## 2020-08-18 DIAGNOSIS — I672 Cerebral atherosclerosis: Secondary | ICD-10-CM | POA: Diagnosis not present

## 2020-08-18 DIAGNOSIS — Z20822 Contact with and (suspected) exposure to covid-19: Secondary | ICD-10-CM | POA: Diagnosis not present

## 2020-08-18 DIAGNOSIS — R6889 Other general symptoms and signs: Secondary | ICD-10-CM | POA: Diagnosis not present

## 2020-08-18 DIAGNOSIS — I639 Cerebral infarction, unspecified: Secondary | ICD-10-CM | POA: Diagnosis present

## 2020-08-18 DIAGNOSIS — R2681 Unsteadiness on feet: Secondary | ICD-10-CM | POA: Insufficient documentation

## 2020-08-18 DIAGNOSIS — G459 Transient cerebral ischemic attack, unspecified: Secondary | ICD-10-CM | POA: Diagnosis not present

## 2020-08-18 DIAGNOSIS — R29810 Facial weakness: Secondary | ICD-10-CM | POA: Diagnosis not present

## 2020-08-18 DIAGNOSIS — R4781 Slurred speech: Secondary | ICD-10-CM | POA: Diagnosis present

## 2020-08-18 DIAGNOSIS — Z7984 Long term (current) use of oral hypoglycemic drugs: Secondary | ICD-10-CM | POA: Diagnosis not present

## 2020-08-18 DIAGNOSIS — I2510 Atherosclerotic heart disease of native coronary artery without angina pectoris: Secondary | ICD-10-CM | POA: Insufficient documentation

## 2020-08-18 DIAGNOSIS — Z7982 Long term (current) use of aspirin: Secondary | ICD-10-CM | POA: Insufficient documentation

## 2020-08-18 DIAGNOSIS — I10 Essential (primary) hypertension: Secondary | ICD-10-CM | POA: Diagnosis not present

## 2020-08-18 DIAGNOSIS — E876 Hypokalemia: Secondary | ICD-10-CM | POA: Diagnosis not present

## 2020-08-18 DIAGNOSIS — E785 Hyperlipidemia, unspecified: Secondary | ICD-10-CM | POA: Diagnosis present

## 2020-08-18 DIAGNOSIS — Z743 Need for continuous supervision: Secondary | ICD-10-CM | POA: Diagnosis not present

## 2020-08-18 DIAGNOSIS — E041 Nontoxic single thyroid nodule: Secondary | ICD-10-CM | POA: Diagnosis not present

## 2020-08-18 DIAGNOSIS — E1159 Type 2 diabetes mellitus with other circulatory complications: Secondary | ICD-10-CM

## 2020-08-18 DIAGNOSIS — R410 Disorientation, unspecified: Secondary | ICD-10-CM | POA: Diagnosis not present

## 2020-08-18 LAB — CBC WITH DIFFERENTIAL/PLATELET
Abs Immature Granulocytes: 0.02 10*3/uL (ref 0.00–0.07)
Basophils Absolute: 0 10*3/uL (ref 0.0–0.1)
Basophils Relative: 0 %
Eosinophils Absolute: 0.2 10*3/uL (ref 0.0–0.5)
Eosinophils Relative: 2 %
HCT: 33.5 % — ABNORMAL LOW (ref 36.0–46.0)
Hemoglobin: 11.4 g/dL — ABNORMAL LOW (ref 12.0–15.0)
Immature Granulocytes: 0 %
Lymphocytes Relative: 28 %
Lymphs Abs: 2.6 10*3/uL (ref 0.7–4.0)
MCH: 31.2 pg (ref 26.0–34.0)
MCHC: 34 g/dL (ref 30.0–36.0)
MCV: 91.8 fL (ref 80.0–100.0)
Monocytes Absolute: 0.7 10*3/uL (ref 0.1–1.0)
Monocytes Relative: 7 %
Neutro Abs: 5.9 10*3/uL (ref 1.7–7.7)
Neutrophils Relative %: 63 %
Platelets: 372 10*3/uL (ref 150–400)
RBC: 3.65 MIL/uL — ABNORMAL LOW (ref 3.87–5.11)
RDW: 14 % (ref 11.5–15.5)
WBC: 9.4 10*3/uL (ref 4.0–10.5)
nRBC: 0 % (ref 0.0–0.2)

## 2020-08-18 LAB — BASIC METABOLIC PANEL
Anion gap: 10 (ref 5–15)
BUN: 12 mg/dL (ref 8–23)
CO2: 26 mmol/L (ref 22–32)
Calcium: 8.8 mg/dL — ABNORMAL LOW (ref 8.9–10.3)
Chloride: 103 mmol/L (ref 98–111)
Creatinine, Ser: 0.93 mg/dL (ref 0.44–1.00)
GFR, Estimated: >60 mL/min (ref >60)
Glucose, Bld: 337 mg/dL — ABNORMAL HIGH (ref 70–99)
Potassium: 3.4 mmol/L — ABNORMAL LOW (ref 3.5–5.1)
Sodium: 139 mmol/L (ref 135–145)

## 2020-08-18 LAB — PROTIME-INR
INR: 1 (ref 0.8–1.2)
Prothrombin Time: 13 seconds (ref 11.4–15.2)

## 2020-08-18 MED ORDER — IOHEXOL 350 MG/ML SOLN
75.0000 mL | Freq: Once | INTRAVENOUS | Status: AC | PRN
  Administered 2020-08-18: 75 mL via INTRAVENOUS

## 2020-08-18 NOTE — ED Notes (Signed)
Pt to ct scanner

## 2020-08-18 NOTE — ED Triage Notes (Signed)
Pt bib [facility] from home where she lives with her husband. Pt was on phone with family and they noticed that she was confused and slurring her speech. Pt's son went to her house at 1925 and noted that pt had slurred speech, L sided weakness, and confusion. When EMS arrived at 1944, all symptoms had resolved.  Pt denies any symptoms at this time.  EMS VS: BP=206/130 HR=86 02=96% on room air

## 2020-08-18 NOTE — ED Provider Notes (Signed)
[facility]   CSN: [id number] Arrival date & time: 08/18/20  2023     History Chief Complaint  Patient presents with  . Aphasia    Maryln ManuelBarbara D Youngren is a 80 y.o. female past medical history carotid artery disease, CAD, hypertension, hyperlipidemia, type 2 diabetes, paroxysmal SVT, hyperlipidemia, left lower extremity neuropathy.  She is presenting today with complaint of an episode of confusion and difficulty speaking.  Symptoms began around 7 PM.  She states she was having trouble getting her words out, daughter states her speech sounded slurred and to wait on the phone.  Patient states she was having difficulty recalling her daughter's last name and figuring out how to function her phone.  Her symptoms lasted about 40 minutes to 1 hour in duration.  They have completely resolved, she feels at her baseline.  She did not have any weakness or numbness, denies any vision changes, headache, chest pain, shortness of breath.  This has not happened before.  She is taking her medications as directed today.  Daughter states EMS noted a blood sugar in the 300s.  The history is provided by the patient (daughter).       Past Medical History:  Diagnosis Date  . Anemia 05/1985  . Carotid artery disease (HCC)    a. 02/2015 - 1-39% bilaterally.  . Chronic back pain   . Concussion   . Coronary artery disease    [facility]  . DDD (degenerative disc disease), cervical   . DDD (degenerative disc disease), lumbar   . Fracture of lower leg 07/2001   Right  . GERD (gastroesophageal reflux disease)   . Hyperlipidemia   . Hypertension   . Multinodular thyroid   . Pneumonia ?1990's X 1  . PSVT (paroxysmal supraventricular tachycardia) (HCC)    a. s/p catheter ablation of a parahisian atrial tachycardia which was successfully ablated from the non-coronary cusp of the aortic root 06/2014.  [name]. Renal cyst 07/10/2006   bilateral  . Right  knee pain 02/2010   Injected - Dr. Shelle IronBeane  . Sinus pause    a. By event monitoring 09/2014 - BB stopped at that time.  . Syncope and collapse 07/2001   "that's when I broke my leg"  . Type II diabetes mellitus (HCC) 2002    Patient Active Problem List   Diagnosis Date Noted  . Healthcare maintenance 08/24/2019  . Advance care planning 08/24/2019  . Hand pain 09/05/2018  . Weakness 06/17/2018  . B12 deficiency 06/16/2018  . Neuropathy 06/13/2018  . Neuropathic pain 05/19/2018  . Edema 04/24/2018  . Lumbosacral radiculopathy at L4 02/04/2018  . Right sided sciatica 10/02/2017  . Neck pain 01/02/2017  . Bruising 09/19/2015  . Dysuria 06/15/2015  . Carotid artery disease (HCC)   . Coronary atherosclerosis of native coronary artery 01/22/2015  . Coronary artery disease involving native coronary artery of native heart without angina pectoris 01/22/2015  . Chest pain 11/18/2014  . Sinus node dysfunction (HCC) 11/02/2014  . SVT (supraventricular tachycardia) (HCC) 07/06/2014  . Benign paroxysmal positional vertigo 05/21/2014  . Weight loss 06/09/2012  . Functional diarrhea 02/01/2012  . Type 2 diabetes mellitus with vascular disease (HCC) 02/13/2011  . Shoulder pain, left 09/12/2010  . ADJUSTMENT REACTION, ADULT 05/17/2010  . KNEE PAIN, RIGHT 10/01/2009  . SYNCOPE 02/22/2009  . PSVT 11/12/2008  . GERD 11/12/2008  . Renal cyst 11/12/2008  . Backache 11/12/2008  . Multiple thyroid  nodules 08/07/2007  . Reflux esophagitis 08/07/2007  . Hyperlipidemia 08/21/2006  . ANEMIA-NOS 08/21/2006  . DEPRESSION 08/21/2006  . Essential hypertension 08/21/2006  . DEGENERATIVE DISC DISEASE, CERVICAL SPINE 08/21/2006  . DEGENERATIVE DISC DISEASE, LUMBAR SPINE 08/21/2006    Past Surgical History:  Procedure Laterality Date  . ANTERIOR CERVICAL DECOMP/DISCECTOMY FUSION  10/03/2005   C4/5; C5/6  "it's got a plate in there"  . ANTERIOR LAT LUMBAR FUSION Left 02/04/2018   Procedure: Lumbar  two-three Lumbar three-four Lumbar four-five  Anterolateral decompression/percutaneous posterior arthrodesis, Mazor;  Surgeon: Barnett Abu, MD;  Location: [facility] OR;  Service: Neurosurgery;  Laterality: Left;  . APPLICATION OF ROBOTIC ASSISTANCE FOR SPINAL PROCEDURE N/A 02/04/2018   Procedure: APPLICATION OF ROBOTIC ASSISTANCE FOR SPINAL PROCEDURE;  Surgeon: Barnett Abu, MD;  Location: [facility] OR;  Service: Neurosurgery;  Laterality: N/A;  . BACK SURGERY    . BREAST BIOPSY Bilateral 1968 (multiple)   "all benign"  . BREAST LUMPECTOMY Bilateral 1968  . CARDIAC CATHETERIZATION N/A 11/18/2014   Procedure: Left Heart Cath and Coronary Angiography;  Surgeon: Peter M [place], MD;  Location: [facility] INVASIVE CV LAB;  Service: Cardiovascular;  Laterality: N/A;  . CARDIAC CATHETERIZATION N/A 01/22/2015   Procedure: Coronary Stent Intervention;  Surgeon: Peter M [place], MD;  Location: [facility] INVASIVE CV LAB;  Service: Cardiovascular;  Laterality: N/A;  . CARDIAC CATHETERIZATION N/A 06/10/2015   Procedure: Left Heart Cath and Coronary Angiography;  Surgeon: Peter M [place], MD;  Location: [facility] INVASIVE CV LAB;  Service: Cardiovascular;  Laterality: N/A;  . CATARACT EXTRACTION W/ INTRAOCULAR LENS  IMPLANT, BILATERAL Bilateral 12/2010  . CORONARY ANGIOPLASTY    . ESOPHAGOGASTRODUODENOSCOPY  08/14/2005   gastropathy biopsy, negative  . LAPAROSCOPIC CHOLECYSTECTOMY  08/1992  . LUMBAR PERCUTANEOUS PEDICLE SCREW 3 LEVEL N/A 02/04/2018   Procedure: LUMBAR PERCUTANEOUS PEDICLE SCREW LUMBAR TWO - LUMBAR FIVE;  Surgeon: Barnett Abu, MD;  Location: [facility] OR;  Service: Neurosurgery;  Laterality: N/A;  . POSTERIOR LAMINECTOMY / DECOMPRESSION LUMBAR SPINE  09/2001   due to herniated disc  . SUPRAVENTRICULAR TACHYCARDIA ABLATION N/A 07/06/2014   Procedure: SUPRAVENTRICULAR TACHYCARDIA ABLATION;  Surgeon: Marinus Maw, MD;  Location: [facility] CATH LAB;  Service: Cardiovascular;  Laterality: N/A;  . VAGINAL HYSTERECTOMY  1968   spotting     OB  History   No obstetric history on file.     Family History  Problem Relation Age of Onset  . Hypertension Mother   . Heart disease Mother   . Diabetes Sister   . Myasthenia gravis Sister   . Diabetes Brother        1/2 juvenile; foot ulcer; periph. neuropathy  . Heart disease Maternal Grandfather        MI  . Hypertension Maternal Grandfather   . Depression Paternal Aunt   . Hypertension Maternal Grandmother   . Diabetes Maternal Grandmother   . Stroke Maternal Grandmother   . Hypertension Paternal Grandmother   . Diabetes Paternal Grandmother   . Hypertension Paternal Grandfather   . Diabetes Paternal Grandfather   . Breast cancer Maternal Aunt   . Colon cancer Neg Hx     Social History   Tobacco Use  . Smoking status: Never Smoker  . Smokeless tobacco: Never Used  Vaping Use  . Vaping Use: Never used  Substance Use Topics  . Alcohol use: No    Alcohol/week: 0.0 standard drinks  . Drug use: No    Home Medications Prior to Admission medications   Medication Sig  Start Date End Date Taking? Authorizing Provider  aspirin EC 81 MG tablet Take 81 mg by mouth once.     [provider]  atenolol (TENORMIN) 25 MG tablet Take 0.5 tablets (12.5 mg total) by mouth daily. 04/08/18   Wendall Stade, MD  Bempedoic Acid-Ezetimibe (NEXLIZET) 180-10 MG TABS Take 1 tablet by mouth daily. 08/16/20   Wendall Stade, MD  cyanocobalamin (,VITAMIN B-12,) 1000 MCG/ML injection 1000 mcg injected IM every 14 days. 10/08/18   Joaquim Nam, MD  glyBURIDE (DIABETA) 5 MG tablet Take 1 tablet (5 mg total) by mouth daily. 04/23/20   Joaquim Nam, MD  loperamide (IMODIUM) 2 MG capsule Take 2 mg by mouth 4 (four) times daily as needed for diarrhea or loose stools.     [provider]  nitroGLYCERIN (NITROSTAT) 0.4 MG SL tablet PLACE 1 TABLET UNDER THE TONGUE EVERY 5 MINUTES AS NEEDED FOR CHEST PAIN FOR UP TO 3 DOSES 09/30/19   Wendall Stade, MD  omeprazole (PRILOSEC) 20 MG  capsule Take 1 capsule (20 mg total) by mouth 2 (two) times daily as needed (for acid reflux/indigestion.). 04/23/18   Joaquim Nam, MD  [facility] ULTRA test strip USE AS INSTRUCTED TO TEST BLOOD SUGAR THREE TIMES DAILY. 08/09/20   Joaquim Nam, MD  traMADol Janean Sark) 50 MG tablet TAKE 1 TABLET BY MOUTH EVERY 6 HOURS AS NEEDED 06/06/20   Joaquim Nam, MD    Allergies    Crestor [rosuvastatin], Ezetimibe-simvastatin, Pravastatin, Diazepam, Ezetimibe, Lyrica [pregabalin], Nsaids, Praluent [alirocumab], Repatha [evolocumab], Acetaminophen, Carvedilol, Gabapentin, Ibuprofen, Insulin glargine, Levemir [insulin detemir], Macrobid [nitrofurantoin macrocrystal], Metformin, Metoprolol tartrate, and Naproxen  Review of Systems   Review of Systems  Neurological: Positive for speech difficulty.  Psychiatric/Behavioral: Positive for confusion.  All other systems reviewed and are negative.   Physical Exam Updated Vital Signs BP 130/72   Pulse 94   Temp 98.6 F (37 C) (Oral)   Resp 17   Ht 5\' 5"  (1.651 m)   Wt 68 kg   SpO2 98%   BMI 24.96 kg/m   Physical Exam Vitals and nursing note reviewed.  Constitutional:      General: She is not in acute distress.    Appearance: She is well-developed. She is not ill-appearing.  HENT:     Head: Normocephalic and atraumatic.  Eyes:     Conjunctiva/sclera: Conjunctivae normal.  Cardiovascular:     Rate and Rhythm: Normal rate and regular rhythm.  Pulmonary:     Effort: Pulmonary effort is normal. No respiratory distress.     Breath sounds: Normal breath sounds.  Abdominal:     General: Bowel sounds are normal.     Palpations: Abdomen is soft.     Tenderness: There is no abdominal tenderness.  Musculoskeletal:     Right lower leg: No edema.     Left lower leg: No edema.  Skin:    General: Skin is warm.  Neurological:     Mental Status: She is alert.     Comments: Mental Status:  Alert, oriented, thought content appropriate, able to  give a coherent history. Speech fluent without evidence of aphasia. Able to follow 2 step commands without difficulty.  Cranial Nerves:  II:  Peripheral visual fields grossly normal, pupils equal, round, reactive to light III,IV, VI: ptosis not present, extra-ocular motions intact bilaterally  V,VII: smile symmetric, facial light touch sensation equal VIII: hearing grossly normal to voice  X: uvula elevates symmetrically  XI: bilateral shoulder shrug symmetric and strong XII: midline tongue extension without fassiculations Motor:  Normal tone. 5/5 strength in upper and lower extremities bilaterally including strong and equal grip strength and dorsiflexion/plantar flexion Sensory: grossly normal in all extremities.  Cerebellar: normal finger-to-nose with bilateral upper extremities CV: distal pulses palpable throughout    Psychiatric:        Behavior: Behavior normal.     ED Results / Procedures / Treatments   Labs (all labs ordered are listed, but only abnormal results are displayed) Labs Reviewed  BASIC METABOLIC PANEL - Abnormal; Notable for the following components:      Result Value   Potassium 3.4 (*)    Glucose, Bld 337 (*)    Calcium 8.8 (*)    All other components within normal limits  CBC WITH DIFFERENTIAL/PLATELET - Abnormal; Notable for the following components:   RBC 3.65 (*)    Hemoglobin 11.4 (*)    HCT 33.5 (*)    All other components within normal limits  RESP PANEL BY RT-PCR (FLU A&B, COVID) ARPGX2  PROTIME-INR    EKG None  Radiology No results found.  Procedures Procedures   Medications Ordered in ED Medications  iohexol (OMNIPAQUE) 350 MG/ML injection 75 mL (75 mLs Intravenous Contrast Given 08/18/20 2254)    ED Course  I have reviewed the triage vital signs and the nursing notes.  Pertinent labs & imaging results that were available during my care of the patient were reviewed by me and considered in my medical decision making (see chart for  details).  Clinical Course as of 08/18/20 2357  Wed Aug 18, 2020  2117 ABCDE2 score of 5 [JR]  2355 Neurology consulted, will follow. [JR]    Clinical Course User Index [JR] Diavian Furgason, [place] N, PA-C   MDM Rules/Calculators/A&P                         Patient is an 80 year old female with multiple risk factors presenting for evaluation of what appears to be TIA.  Symptoms began around 7 PM this evening, resolved somewhere between 740 to 8 PM.  Symptoms included slurred speech, disorientation and difficulty word finding.  She is asymptomatic on evaluation and has no focal neuro deficits.  Blood work ordered.  CTA of the head and neck also obtained.  ABCD 2 score of 5, she will likely need admission for TIA work-up.   Neurology consulted and will follow.  Scans and admission to be followed by oncoming provider, Petrucelli, PA-C.  Patient in agreement with admission. Final Clinical Impression(s) / ED Diagnoses Final diagnoses:  TIA (transient ischemic attack)    Rx / DC Orders ED Discharge Orders    None       Quantina Dershem, [place] N, PA-C 08/18/20 2357    Benjiman Core, MD 08/20/20 [phone]

## 2020-08-18 NOTE — ED Notes (Signed)
Pt returned from ct scanner.  

## 2020-08-19 ENCOUNTER — Observation Stay (HOSPITAL_COMMUNITY): Payer: Medicare PPO

## 2020-08-19 ENCOUNTER — Encounter (HOSPITAL_COMMUNITY)
Admission: EM | Disposition: A | Discharge status: Home / Self Care | Payer: Self-pay | Source: Home / Self Care | Attending: Emergency Medicine

## 2020-08-19 ENCOUNTER — Encounter (HOSPITAL_COMMUNITY): Payer: Self-pay | Admitting: Internal Medicine

## 2020-08-19 ENCOUNTER — Observation Stay (HOSPITAL_BASED_OUTPATIENT_CLINIC_OR_DEPARTMENT_OTHER): Payer: Medicare PPO

## 2020-08-19 DIAGNOSIS — R739 Hyperglycemia, unspecified: Secondary | ICD-10-CM

## 2020-08-19 DIAGNOSIS — I616 Nontraumatic intracerebral hemorrhage, multiple localized: Secondary | ICD-10-CM | POA: Diagnosis not present

## 2020-08-19 DIAGNOSIS — I471 Supraventricular tachycardia: Secondary | ICD-10-CM

## 2020-08-19 DIAGNOSIS — G9389 Other specified disorders of brain: Secondary | ICD-10-CM | POA: Diagnosis not present

## 2020-08-19 DIAGNOSIS — I639 Cerebral infarction, unspecified: Secondary | ICD-10-CM | POA: Diagnosis not present

## 2020-08-19 DIAGNOSIS — E1159 Type 2 diabetes mellitus with other circulatory complications: Secondary | ICD-10-CM

## 2020-08-19 DIAGNOSIS — E782 Mixed hyperlipidemia: Secondary | ICD-10-CM

## 2020-08-19 DIAGNOSIS — G930 Cerebral cysts: Secondary | ICD-10-CM | POA: Diagnosis not present

## 2020-08-19 DIAGNOSIS — G459 Transient cerebral ischemic attack, unspecified: Secondary | ICD-10-CM | POA: Diagnosis not present

## 2020-08-19 DIAGNOSIS — I6389 Other cerebral infarction: Secondary | ICD-10-CM

## 2020-08-19 DIAGNOSIS — E876 Hypokalemia: Secondary | ICD-10-CM

## 2020-08-19 DIAGNOSIS — I63521 Cerebral infarction due to unspecified occlusion or stenosis of right anterior cerebral artery: Secondary | ICD-10-CM

## 2020-08-19 DIAGNOSIS — I10 Essential (primary) hypertension: Secondary | ICD-10-CM

## 2020-08-19 HISTORY — PX: LOOP RECORDER INSERTION: EP1214

## 2020-08-19 LAB — CBC
HCT: 35 % — ABNORMAL LOW (ref 36.0–46.0)
Hemoglobin: 11.8 g/dL — ABNORMAL LOW (ref 12.0–15.0)
MCH: 31.1 pg (ref 26.0–34.0)
MCHC: 33.7 g/dL (ref 30.0–36.0)
MCV: 92.3 fL (ref 80.0–100.0)
Platelets: 345 10*3/uL (ref 150–400)
RBC: 3.79 MIL/uL — ABNORMAL LOW (ref 3.87–5.11)
RDW: 14 % (ref 11.5–15.5)
WBC: 9.5 10*3/uL (ref 4.0–10.5)
nRBC: 0 % (ref 0.0–0.2)

## 2020-08-19 LAB — MAGNESIUM: Magnesium: 1.6 mg/dL — ABNORMAL LOW (ref 1.7–2.4)

## 2020-08-19 LAB — GLUCOSE, CAPILLARY
Glucose-Capillary: 195 mg/dL — ABNORMAL HIGH (ref 70–99)
Glucose-Capillary: 197 mg/dL — ABNORMAL HIGH (ref 70–99)
Glucose-Capillary: 137 mg/dL — ABNORMAL HIGH (ref 70–99)
Glucose-Capillary: 284 mg/dL — ABNORMAL HIGH (ref 70–99)

## 2020-08-19 LAB — ECHOCARDIOGRAM COMPLETE
Area-P 1/2: 3.03 cm2
Calc EF: 64 %
Height: 65 in
S' Lateral: 2.3 cm
Single Plane A2C EF: 57.1 %
Single Plane A4C EF: 69.8 %
Weight: 2400 oz

## 2020-08-19 LAB — BASIC METABOLIC PANEL
Anion gap: 6 (ref 5–15)
BUN: 9 mg/dL (ref 8–23)
CO2: 28 mmol/L (ref 22–32)
Calcium: 9.1 mg/dL (ref 8.9–10.3)
Chloride: 105 mmol/L (ref 98–111)
Creatinine, Ser: 0.77 mg/dL (ref 0.44–1.00)
GFR, Estimated: >60 mL/min (ref >60)
Glucose, Bld: 237 mg/dL — ABNORMAL HIGH (ref 70–99)
Potassium: 4 mmol/L (ref 3.5–5.1)
Sodium: 139 mmol/L (ref 135–145)

## 2020-08-19 LAB — LIPID PANEL
Cholesterol: 190 mg/dL (ref 0–200)
HDL: 46 mg/dL (ref >40)
LDL Cholesterol: 127 mg/dL — ABNORMAL HIGH (ref 0–99)
Total CHOL/HDL Ratio: 4.1 RATIO
Triglycerides: 85 mg/dL (ref <150)
VLDL: 17 mg/dL (ref 0–40)

## 2020-08-19 LAB — HEMOGLOBIN A1C
Hgb A1c MFr Bld: 9.4 % — ABNORMAL HIGH (ref 4.8–5.6)
Mean Plasma Glucose: 223.08 mg/dL

## 2020-08-19 LAB — RESP PANEL BY RT-PCR (FLU A&B, COVID) ARPGX2
Influenza A by PCR: NEGATIVE
Influenza B by PCR: NEGATIVE
SARS Coronavirus 2 by RT PCR: NEGATIVE

## 2020-08-19 LAB — TSH: TSH: 1.97 u[IU]/mL (ref 0.350–4.500)

## 2020-08-19 SURGERY — LOOP RECORDER INSERTION
Anesthesia: LOCAL

## 2020-08-19 MED ORDER — ONDANSETRON HCL 4 MG/2ML IJ SOLN: 4.0000 mg | Freq: Four times a day (QID) | INTRAMUSCULAR | Status: DC | PRN

## 2020-08-19 MED ORDER — ENSURE ENLIVE PO LIQD
237.0000 mL | Freq: Two times a day (BID) | ORAL | Status: DC
  Administered 2020-08-19 – 2020-08-20 (×2): 237 mL via ORAL

## 2020-08-19 MED ORDER — CLOPIDOGREL BISULFATE 75 MG PO TABS: 75.0000 mg | ORAL_TABLET | Freq: Every day | ORAL | 0 refills | Status: DC

## 2020-08-19 MED ORDER — LORAZEPAM 2 MG/ML IJ SOLN
1.0000 mg | Freq: Once | INTRAMUSCULAR | Status: AC | PRN
  Administered 2020-08-19: 1 mg via INTRAVENOUS
  Filled 2020-08-19: qty 1

## 2020-08-19 MED ORDER — INSULIN ASPART 100 UNIT/ML ~~LOC~~ SOLN
0.0000 [IU] | Freq: Every day | SUBCUTANEOUS | Status: DC
  Administered 2020-08-19: 3 [IU] via SUBCUTANEOUS

## 2020-08-19 MED ORDER — ASPIRIN EC 325 MG PO TBEC: 325.0000 mg | DELAYED_RELEASE_TABLET | Freq: Once | ORAL | Status: DC

## 2020-08-19 MED ORDER — INSULIN ASPART 100 UNIT/ML ~~LOC~~ SOLN
0.0000 [IU] | Freq: Three times a day (TID) | SUBCUTANEOUS | Status: DC
  Administered 2020-08-19: 1 [IU] via SUBCUTANEOUS
  Administered 2020-08-19 (×2): 2 [IU] via SUBCUTANEOUS
  Administered 2020-08-20: 5 [IU] via SUBCUTANEOUS

## 2020-08-19 MED ORDER — MAGNESIUM SULFATE 2 GM/50ML IV SOLN
2.0000 g | Freq: Once | INTRAVENOUS | Status: AC
  Administered 2020-08-19: 2 g via INTRAVENOUS
  Filled 2020-08-19: qty 50

## 2020-08-19 MED ORDER — ENSURE ENLIVE PO LIQD: 237.0000 mL | Freq: Two times a day (BID) | ORAL | 12 refills | Status: DC

## 2020-08-19 MED ORDER — CLOPIDOGREL BISULFATE 75 MG PO TABS
75.0000 mg | ORAL_TABLET | Freq: Every day | ORAL | Status: DC
  Administered 2020-08-19 – 2020-08-20 (×2): 75 mg via ORAL
  Filled 2020-08-19 (×2): qty 1

## 2020-08-19 MED ORDER — MAGNESIUM SULFATE IN D5W 1-5 GM/100ML-% IV SOLN
1.0000 g | Freq: Once | INTRAVENOUS | Status: AC
  Administered 2020-08-19: 1 g via INTRAVENOUS
  Filled 2020-08-19: qty 100

## 2020-08-19 MED ORDER — STROKE: EARLY STAGES OF RECOVERY BOOK
Freq: Once | Status: AC
Start: 2020-08-19 — End: 2020-08-19
  Filled 2020-08-19: qty 1

## 2020-08-19 MED ORDER — LIDOCAINE-EPINEPHRINE 1 %-1:100000 IJ SOLN
INTRAMUSCULAR | Status: AC
  Filled 2020-08-19: qty 1

## 2020-08-19 MED ORDER — ASPIRIN EC 325 MG PO TBEC
325.0000 mg | DELAYED_RELEASE_TABLET | Freq: Every day | ORAL | Status: DC
  Administered 2020-08-19 – 2020-08-20 (×2): 325 mg via ORAL
  Filled 2020-08-19 (×2): qty 1

## 2020-08-19 MED ORDER — LIDOCAINE HCL (PF) 1 % IJ SOLN
INTRAMUSCULAR | Status: DC | PRN
  Administered 2020-08-19: 20 mL

## 2020-08-19 MED ORDER — ASPIRIN EC 81 MG PO TBEC: 81.0000 mg | DELAYED_RELEASE_TABLET | Freq: Every day | ORAL | Status: DC

## 2020-08-19 MED ORDER — POTASSIUM CHLORIDE CRYS ER 10 MEQ PO TBCR
40.0000 meq | EXTENDED_RELEASE_TABLET | Freq: Once | ORAL | Status: AC
  Administered 2020-08-19: 40 meq via ORAL
  Filled 2020-08-19: qty 4

## 2020-08-19 MED ORDER — BEMPEDOIC ACID-EZETIMIBE 180-10 MG PO TABS: 1.0000 | ORAL_TABLET | Freq: Every day | ORAL | Status: DC

## 2020-08-19 MED ORDER — ASPIRIN 325 MG PO TBEC: 325.0000 mg | DELAYED_RELEASE_TABLET | Freq: Every day | ORAL | 1 refills | Status: DC

## 2020-08-19 SURGICAL SUPPLY — 2 items
MONITOR REVEAL LINQ II (Prosthesis & Implant Heart) ×1 IMPLANT
PACK LOOP INSERTION (CUSTOM PROCEDURE TRAY) ×2 IMPLANT

## 2020-08-19 NOTE — Progress Notes (Signed)
OT Cancellation Note  Patient Details Name: CRYSTALYN DELIA MRN: [id number] DOB: Jul 28, 1940   Cancelled Treatment:    Reason Eval/Treat Not Completed: Patient at procedure or test/ unavailable. Pt initially with MD (neurologist), returned and ECHO in room.  Will follow and see as able.   Barry Brunner, OT Acute Rehabilitation Services Pager [phone] Office [phone]   Chancy Milroy 08/19/2020, 11:08 AM

## 2020-08-19 NOTE — Consult Note (Signed)
Neurology Consultation Reason for Consult: c/f TIA  Requesting Physician: Benjiman Core  CC: Transient difficulty speaking/thinking  History is obtained from: Patient and chart review  HPI: Maria [name] is a 80 y.o. female with a past medical history significant for coronary artery disease (s/p DES to [facility] 01/2015), hypertension, hyperlipidemia, diabetes paroxysmal SVT s/p ablation, sinus pause for which beta-blocker was stopped, degenerative disc disease status post cervical spine and lumbar spine surgeries  Patient was in her usual state of health on 4/6 when she began to have some difficulty thinking of her husband's name, her daughter's name, and using her phone at around 7 PM.  She was home alone at the time.  Her daughter then called her and they spoke briefly, her daughter noting that she was having some speech difficulty at that time.  The patient initially hung up after a brief conversation but then decided to call her daughter back due to her concern about her speech at which point her daughter called with the patient's husband, who came home and brought her to the emergency room.  Prior to the patient's arrival her symptoms had fully resolved, lasting about an hour total.  Blood pressure was initially 215/82 on arrival with heart rates in the 80s to 90s respiratory rate normal and satting well on room.  Blood pressure normalized to the 130s/60s without intervention  Note she is not on statin secondary to myalgias, and not on Repatha secondary to hoarse voice, however she is on nexlizit which she reports she started less than a year ago and has been tolerating well.  Otherwise she has had no recent medication changes.  She has been noting worsening feet swelling, left greater than right in the past few weeks, exacerbated by extended standing, relieved by putting her feet up.  Seen by cardiology, Dr. [name], on 4/4 with a diagnosis of dependent edema for this issue.  Additionally she  reports her cardiologist took her off of aspirin years ago due to her complaints of easy bruising.  LKW: 7 PM on 4/6 tPA given?: No, symptoms resolved at time of presentation Premorbid modified rankin scale:    1 - No significant disability. Able to carry out all usual activities, despite some symptoms.  ROS: All other review of systems was negative except as noted in the HPI.    Past Medical History:  Diagnosis Date  . Anemia 05/1985  . Carotid artery disease (HCC)    a. 02/2015 - 1-39% bilaterally.  . Chronic back pain   . Concussion   . Coronary artery disease    a. s/p DES to [facility] 01/2015.  . DDD (degenerative disc disease), cervical   . DDD (degenerative disc disease), lumbar   . Fracture of lower leg 07/2001   Right  . GERD (gastroesophageal reflux disease)   . Hyperlipidemia   . Hypertension   . Multinodular thyroid   . Pneumonia ?1990's X 1  . PSVT (paroxysmal supraventricular tachycardia) (HCC)    a. s/p catheter ablation of a parahisian atrial tachycardia which was successfully ablated from the non-coronary cusp of the aortic root 06/2014.  [name] Renal cyst 07/10/2006   bilateral  . Right knee pain 02/2010   Injected - Dr. Shelle Iron  . Sinus pause    a. By event monitoring 09/2014 - BB stopped at that time.  . Syncope and collapse 07/2001   "that's when I broke my leg"  . Type II diabetes mellitus (HCC) 2002   Past Surgical History:  Procedure Laterality Date  . ANTERIOR CERVICAL DECOMP/DISCECTOMY FUSION  10/03/2005   C4/5; C5/6  "it's got a plate in there"  . ANTERIOR LAT LUMBAR FUSION Left 02/04/2018   Procedure: Lumbar two-three Lumbar three-four Lumbar four-five  Anterolateral decompression/percutaneous posterior arthrodesis, Mazor;  Surgeon: Barnett Abu, MD;  Location: [facility] OR;  Service: Neurosurgery;  Laterality: Left;  . APPLICATION OF ROBOTIC ASSISTANCE FOR SPINAL PROCEDURE N/A 02/04/2018   Procedure: APPLICATION OF ROBOTIC ASSISTANCE FOR SPINAL PROCEDURE;  Surgeon:  Barnett Abu, MD;  Location: [facility] OR;  Service: Neurosurgery;  Laterality: N/A;  . BACK SURGERY    . BREAST BIOPSY Bilateral 1968 (multiple)   "all benign"  . BREAST LUMPECTOMY Bilateral 1968  . CARDIAC CATHETERIZATION N/A 11/18/2014   Procedure: Left Heart Cath and Coronary Angiography;  Surgeon: Peter M Swaziland, MD;  Location: [facility] INVASIVE CV LAB;  Service: Cardiovascular;  Laterality: N/A;  . CARDIAC CATHETERIZATION N/A 01/22/2015   Procedure: Coronary Stent Intervention;  Surgeon: Peter M Swaziland, MD;  Location: [facility] INVASIVE CV LAB;  Service: Cardiovascular;  Laterality: N/A;  . CARDIAC CATHETERIZATION N/A 06/10/2015   Procedure: Left Heart Cath and Coronary Angiography;  Surgeon: Peter M Swaziland, MD;  Location: [facility] INVASIVE CV LAB;  Service: Cardiovascular;  Laterality: N/A;  . CATARACT EXTRACTION W/ INTRAOCULAR LENS  IMPLANT, BILATERAL Bilateral 12/2010  . CORONARY ANGIOPLASTY    . ESOPHAGOGASTRODUODENOSCOPY  08/14/2005   gastropathy biopsy, negative  . LAPAROSCOPIC CHOLECYSTECTOMY  08/1992  . LUMBAR PERCUTANEOUS PEDICLE SCREW 3 LEVEL N/A 02/04/2018   Procedure: LUMBAR PERCUTANEOUS PEDICLE SCREW LUMBAR TWO - LUMBAR FIVE;  Surgeon: Barnett Abu, MD;  Location: [facility] OR;  Service: Neurosurgery;  Laterality: N/A;  . POSTERIOR LAMINECTOMY / DECOMPRESSION LUMBAR SPINE  09/2001   due to herniated disc  . SUPRAVENTRICULAR TACHYCARDIA ABLATION N/A 07/06/2014   Procedure: SUPRAVENTRICULAR TACHYCARDIA ABLATION;  Surgeon: Marinus Maw, MD;  Location: [facility] CATH LAB;  Service: Cardiovascular;  Laterality: N/A;  . VAGINAL HYSTERECTOMY  1968   spotting   Current Outpatient Medications  Medication Instructions  . Bempedoic Acid-Ezetimibe (NEXLIZET) 180-10 MG TABS 1 tablet, Oral, Daily  . cyanocobalamin (,VITAMIN B-12,) 1000 MCG/ML injection 1000 mcg injected IM every 14 days.  [name] glyBURIDE (DIABETA) 5 mg, Oral, Daily  . loperamide (IMODIUM) 2 mg, Oral, 4 times daily PRN  . nitroGLYCERIN (NITROSTAT) 0.4 MG SL  tablet PLACE 1 TABLET UNDER THE TONGUE EVERY 5 MINUTES AS NEEDED FOR CHEST PAIN FOR UP TO 3 DOSES  . omeprazole (PRILOSEC) 20 mg, Oral, 2 times daily PRN  . ONETOUCH ULTRA test strip USE AS INSTRUCTED TO TEST BLOOD SUGAR THREE TIMES DAILY.  [name] oxyCODONE (OXY IR/ROXICODONE) 5 mg, Oral, Every 6 hours PRN  . traMADol (ULTRAM) 50 MG tablet TAKE 1 TABLET BY MOUTH EVERY 6 HOURS AS NEEDED   Family History  Problem Relation Age of Onset  . Hypertension Mother   . Heart disease Mother   . Diabetes Sister   . Myasthenia gravis Sister   . Diabetes Brother        1/2 juvenile; foot ulcer; periph. neuropathy  . Heart disease Maternal Grandfather        MI  . Hypertension Maternal Grandfather   . Depression Paternal Aunt   . Hypertension Maternal Grandmother   . Diabetes Maternal Grandmother   . Stroke Maternal Grandmother   . Hypertension Paternal Grandmother   . Diabetes Paternal Grandmother   . Hypertension Paternal Grandfather   . Diabetes Paternal Grandfather   . Breast  cancer Maternal Aunt   . Colon cancer Neg Hx    Social History:  reports that she has never smoked. She has never used smokeless tobacco. She reports that she does not drink alcohol and does not use drugs.  Exam: Current vital signs: BP 130/72   Pulse 94   Temp 98.6 F (37 C) (Oral)   Resp 17   Ht 5\' 5"  (1.651 m)   Wt 68 kg   SpO2 98%   BMI 24.96 kg/m  Vital signs in last 24 hours: Temp:  [98.6 F (37 C)] 98.6 F (37 C) (04/06 2029) Pulse Rate:  [89-94] 94 (04/06 2300) Resp:  [12-21] 17 (04/06 2300) BP: (130-215)/(55-82) 130/72 (04/06 2300) SpO2:  [97 %-99 %] 98 % (04/06 2300) Weight:  [68 kg] 68 kg (04/06 2031)   Physical Exam  Constitutional: Appears well-developed and well-nourished.  Psych: Affect appropriate to situation, intermittently mildly anxious especially about MRI Eyes: No scleral injection HENT: No oropharyngeal obstruction.  Good dentition. MSK: no joint deformities.  Cardiovascular:  Normal rate and regular rhythm.  Respiratory: Effort normal, non-labored breathing GI: Soft.  No distension. There is no tenderness.  Skin: Warm dry and intact visible skin  Neuro: Mental Status: Patient is awake, alert, oriented to person, place, month, year, and situation. Patient is able to give a clear and coherent history. No signs of aphasia or neglect Cranial Nerves: II: Visual Fields are full. Pupils are equal, round, and reactive to light.   III,IV, VI: EOMI without ptosis or diploplia.  V: Facial sensation is symmetric to temperature VII: Facial movement is symmetric.  VIII: hearing is intact to voice X: Uvula elevates symmetrically XI: Shoulder shrug and head turn is symmetric. XII: tongue is midline without atrophy or fasciculations.  Motor: Tone is normal. Bulk is normal. 5/5 strength was present in all four extremities other than bilateral hip flexor weakness (pain limited, 4/5) and left foot dorsiflexion weakness (4-/5) Sensory: Sensation is symmetric to light touch and temperature in the arms and legs. Deep Tendon Reflexes: 2+ and symmetric in the biceps and patellae.  Plantars: Toes are downgoing bilaterally.  Cerebellar: FNF and HKS are intact bilaterally  NIHSS total 0  I have reviewed labs in epic and the results pertinent to this consultation are: Blood glucose 337, with resultant mild hypokalemia at 3.4 and mild hypocalcemia at 8.8 CBC notable for mild anemia with hemoglobin 11.4 (normocytic, normal RDW), PT/INR within normal limits  Lab Results  Component Value Date   HGBA1C 7.2 (H) 04/23/2020   Lab Results  Component Value Date   CHOL 143 08/14/2019   HDL 42 (L) 08/14/2019   LDLCALC 81 08/14/2019   LDLDIRECT 168.1 02/18/2010   TRIG 108 08/14/2019   CHOLHDL 3.4 08/14/2019     I have personally reviewed the images obtained and agree with radiology impression:  Head CT: 1. No acute intracranial abnormality identified. Specifically,  no evidence for acute or evolving ischemia. 2. Moderate to advanced chronic microvascular ischemic disease. 3. Probable 7 mm hemangioma overlying the right frontal convexity without associated mass effect.  CTA: 1. 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 Attenuated and irregular flow is seen distally within right ACA branches, likely collateral. 2. Mild atherosclerotic change about the carotid bifurcations and carotid siphons without hemodynamically significant stenosis. 3. Diffuse small vessel atheromatous irregularity elsewhere throughout the intracranial circulation  Impression: This is an 80 year old woman with multiple vascular risk factors as above presenting with a transient confusional episode  where she particularly describes language difficulties.  This is concerning for potential left MCA territory TIA versus small residual infarction for which stroke work-up is indicated.  Given her burden of chronic microvascular disease, the other potential diagnosis is hypertensive emergency as she was quite hypertensive on arrival to the ED (though notably at this time her symptoms had already resolved making this less likely).  Recommendations:  #Likely TIA versus small stroke - Stroke labs TSH, HgbA1c, fasting lipid panel (ordered) - MRI brain w/o contrast  (ordered) - Frequent neuro checks - Echocardiogram (ordered) - Prophylactic therapy-Antiplatelet med: Aspirin - dose 325mg  PO or 300mg  PR, followed by 81 mg daily - Consider Plavix 300 mg load with 75 mg daily for 21 - 90 day course, held off for now given Patient's concerns about bruising - Continue home Nexilent - Risk factor modification - Telemetry monitoring; 30 day event monitor on discharge if no arrythmias captured  - Blood pressure goal   - Normotension given patient is tolerating normal blood pressure well and has self regulated to this - Stroke team to follow  MD-PhD [facility] [phone] Available 7 PM to 7 AM, outside of these hours please call Neurologist on call as listed on Amion.

## 2020-08-19 NOTE — ED Notes (Signed)
Attempted to call report x 1  

## 2020-08-19 NOTE — Evaluation (Signed)
Speech Language Pathology Evaluation Patient Details Name: Maria [name] MRN: [id number] DOB: 1941/05/04 Today's Date: 08/19/2020 Time: 8182-9937 SLP Time Calculation (min) (ACUTE ONLY): 21.73 min  Problem List:  Patient Active Problem List   Diagnosis Date Noted  . TIA (transient ischemic attack) 08/19/2020  . Hypokalemia 08/19/2020  . Hyperglycemia 08/19/2020  . Healthcare maintenance 08/24/2019  . Advance care planning 08/24/2019  . Hand pain 09/05/2018  . Weakness 06/17/2018  . B12 deficiency 06/16/2018  . Neuropathy 06/13/2018  . Neuropathic pain 05/19/2018  . Edema 04/24/2018  . Lumbosacral radiculopathy at L4 02/04/2018  . Right sided sciatica 10/02/2017  . Neck pain 01/02/2017  . Bruising 09/19/2015  . Dysuria 06/15/2015  . Carotid artery disease (HCC)   . Coronary atherosclerosis of native coronary artery 01/22/2015  . Coronary artery disease involving native coronary artery of native heart without angina pectoris 01/22/2015  . Chest pain 11/18/2014  . Sinus node dysfunction (HCC) 11/02/2014  . SVT (supraventricular tachycardia) (HCC) 07/06/2014  . Benign paroxysmal positional vertigo 05/21/2014  . Weight loss 06/09/2012  . Functional diarrhea 02/01/2012  . Type 2 diabetes mellitus with vascular disease (HCC) 02/13/2011  . Shoulder pain, left 09/12/2010  . ADJUSTMENT REACTION, ADULT 05/17/2010  . KNEE PAIN, RIGHT 10/01/2009  . SYNCOPE 02/22/2009  . PSVT 11/12/2008  . GERD 11/12/2008  . Renal cyst 11/12/2008  . Backache 11/12/2008  . Multiple thyroid nodules 08/07/2007  . Reflux esophagitis 08/07/2007  . Hyperlipidemia 08/21/2006  . ANEMIA-NOS 08/21/2006  . DEPRESSION 08/21/2006  . Essential hypertension 08/21/2006  . DEGENERATIVE DISC DISEASE, CERVICAL SPINE 08/21/2006  . DEGENERATIVE DISC DISEASE, LUMBAR SPINE 08/21/2006   Past Medical History:  Past Medical History:  Diagnosis Date  . Anemia 05/1985  . Carotid artery disease (HCC)    a. 02/2015 -  1-39% bilaterally.  . Chronic back pain   . Concussion   . Coronary artery disease    a. s/p DES to [facility] 01/2015.  . DDD (degenerative disc disease), cervical   . DDD (degenerative disc disease), lumbar   . Fracture of lower leg 07/2001   Right  . GERD (gastroesophageal reflux disease)   . Hyperlipidemia   . Hypertension   . Multinodular thyroid   . Pneumonia ?1990's X 1  . PSVT (paroxysmal supraventricular tachycardia) (HCC)    a. s/p catheter ablation of a parahisian atrial tachycardia which was successfully ablated from the non-coronary cusp of the aortic root 06/2014.  [name] Renal cyst 07/10/2006   bilateral  . Right knee pain 02/2010   Injected - Dr. Shelle Iron  . Sinus pause    a. By event monitoring 09/2014 - BB stopped at that time.  . Syncope and collapse 07/2001   "that's when I broke my leg"  . Type II diabetes mellitus (HCC) 2002   Past Surgical History:  Past Surgical History:  Procedure Laterality Date  . ANTERIOR CERVICAL DECOMP/DISCECTOMY FUSION  10/03/2005   C4/5; C5/6  "it's got a plate in there"  . ANTERIOR LAT LUMBAR FUSION Left 02/04/2018   Procedure: Lumbar two-three Lumbar three-four Lumbar four-five  Anterolateral decompression/percutaneous posterior arthrodesis, Mazor;  Surgeon: Barnett Abu, MD;  Location: [facility] OR;  Service: Neurosurgery;  Laterality: Left;  . APPLICATION OF ROBOTIC ASSISTANCE FOR SPINAL PROCEDURE N/A 02/04/2018   Procedure: APPLICATION OF ROBOTIC ASSISTANCE FOR SPINAL PROCEDURE;  Surgeon: Barnett Abu, MD;  Location: [facility] OR;  Service: Neurosurgery;  Laterality: N/A;  . BACK SURGERY    . BREAST BIOPSY Bilateral 1968 (multiple)   "  all benign"  . BREAST LUMPECTOMY Bilateral 1968  . CARDIAC CATHETERIZATION N/A 11/18/2014   Procedure: Left Heart Cath and Coronary Angiography;  Surgeon: Peter M Swaziland, MD;  Location: [facility] INVASIVE CV LAB;  Service: Cardiovascular;  Laterality: N/A;  . CARDIAC CATHETERIZATION N/A 01/22/2015   Procedure: Coronary Stent  Intervention;  Surgeon: Peter M Swaziland, MD;  Location: [facility] INVASIVE CV LAB;  Service: Cardiovascular;  Laterality: N/A;  . CARDIAC CATHETERIZATION N/A 06/10/2015   Procedure: Left Heart Cath and Coronary Angiography;  Surgeon: Peter M Swaziland, MD;  Location: [facility] INVASIVE CV LAB;  Service: Cardiovascular;  Laterality: N/A;  . CATARACT EXTRACTION W/ INTRAOCULAR LENS  IMPLANT, BILATERAL Bilateral 12/2010  . CORONARY ANGIOPLASTY    . ESOPHAGOGASTRODUODENOSCOPY  08/14/2005   gastropathy biopsy, negative  . LAPAROSCOPIC CHOLECYSTECTOMY  08/1992  . LOOP RECORDER INSERTION N/A 08/19/2020   Procedure: LOOP RECORDER INSERTION;  Surgeon: Marinus Maw, MD;  Location: [facility] INVASIVE CV LAB;  Service: Cardiovascular;  Laterality: N/A;  . LUMBAR PERCUTANEOUS PEDICLE SCREW 3 LEVEL N/A 02/04/2018   Procedure: LUMBAR PERCUTANEOUS PEDICLE SCREW LUMBAR TWO - LUMBAR FIVE;  Surgeon: Barnett Abu, MD;  Location: [facility] OR;  Service: Neurosurgery;  Laterality: N/A;  . POSTERIOR LAMINECTOMY / DECOMPRESSION LUMBAR SPINE  09/2001   due to herniated disc  . SUPRAVENTRICULAR TACHYCARDIA ABLATION N/A 07/06/2014   Procedure: SUPRAVENTRICULAR TACHYCARDIA ABLATION;  Surgeon: Marinus Maw, MD;  Location: [facility] CATH LAB;  Service: Cardiovascular;  Laterality: N/A;  . VAGINAL HYSTERECTOMY  1968   spotting   HPI:  Pt is an 80 y/o female with PMH includes HTN, CAD, and DM. She was admitted 4/6 secondary to slurred speech and difficulty with word finding. MRI brain 4/7: Small acute on chronic white matter infarct at the right corona radiata, right superior frontal gyrus.   Assessment / Plan / Recommendation Clinical Impression  Pt participated in speech/language/cognition evaluation with her husband present. Both parties denied the pt having any baseline deficits in speech, language, or cognition. Pt and her husband reported that her speech and language skills have returned to baseline. Pt denied any acute changes in cognition, but pt's  husband stated that she is not as "sharp" as prior to admission and that her processing speed is reduced. [facility] Mental Status Examination was completed to evaluate the pt's cognitive-linguistic skills. She achieved a score of 20/30 which is below the normal limits of 27 or more out of 30. She exhibited difficulty in the areas of awareness, attention, memory, and executive function. Her speech and language skills were WFL. Vocal intensity was reduced and pitch was high; however, speech intelligibility was adequate and pt's husband stated that her voice is at baseline. Skilled SLP services are clinically indicated at this time to improve cognitive-linguistic function.    SLP Assessment  SLP Recommendation/Assessment: Patient needs continued Speech Lanaguage Pathology Services SLP Visit Diagnosis: Cognitive communication deficit (R41.841)    Follow Up Recommendations  Home health SLP    Frequency and Duration min 2x/week  2 weeks      SLP Evaluation Cognition  Overall Cognitive Status: Impaired/Different from baseline Arousal/Alertness: Awake/alert Orientation Level: Oriented X4 (date and day off by one day. Pt stated Friday, April 8) Attention: Focused;Sustained Focused Attention: Impaired Focused Attention Impairment: Verbal complex Sustained Attention: Impaired Sustained Attention Impairment: Verbal basic Memory: Impaired Memory Impairment: Retrieval deficit;Decreased recall of new information (Immediate: 5/5; delayed: 0/5; with cues: 2/5) Awareness: Impaired Awareness Impairment: Intellectual impairment Problem Solving: Appears intact Executive  Function: Sequencing;Organizing Sequencing:  (Clock drawing: 2/4) Organizing: Appears intact (Backward digit span: 3/3)       Comprehension  Auditory Comprehension Overall Auditory Comprehension: Appears within functional limits for tasks assessed Yes/No Questions: Within Functional Limits Commands: Within Functional  Limits Conversation: Complex Reading Comprehension Reading Status: Not tested    Expression Expression Primary Mode of Expression: Verbal Verbal Expression Overall Verbal Expression: Appears within functional limits for tasks assessed Initiation: No impairment Naming: No impairment Pragmatics: No impairment   Oral / Motor  Oral Motor/Sensory Function Overall Oral Motor/Sensory Function: Within functional limits Motor Speech Overall Motor Speech: Appears within functional limits for tasks assessed Respiration: Within functional limits Phonation: Low vocal intensity Resonance: Within functional limits Articulation: Within functional limitis Intelligibility: Intelligible Motor Planning: Witnin functional limits   Mercy Malena I. Vear Clock, MS, CCC-SLP Acute Rehabilitation Services Office number [phone] Pager [phone]                   Scheryl Marten 08/19/2020, 5:52 PM

## 2020-08-19 NOTE — Discharge Summary (Addendum)
Physician Discharge Summary  [name]:[id number] DOB: May 28, 1940  PCP: Joaquim Nam, MD  Admitted from: Home Discharged to: Home  Admit date: 08/18/2020 Discharge date: 08/19/2020  Recommendations for Outpatient Follow-up:    Follow-up Information    [facility] Follow up.   Why: on 4/19 at 11:15 for post loop recorder check. Contact information: [address] [place] [address] [phone]       Care, [facility] Follow up.   Specialty: Home Health Services Why: The home health agency will contact you for the first home visit. Contact information: [address] [place] [address] [phone]        Joaquim Nam, MD. Schedule an appointment as soon as possible for a visit in 1 week(s).   Specialty: Family Medicine Why: To be seen with repeat labs (CBC, BMP, magnesium). Contact information: [address] [place] [address] [phone]        Wendall Stade, MD .   Specialty: Cardiology Contact information: [phone] [address] [place] [address] [phone]                Home Health:  Home Health Orders (From admission, onward)    Start     Ordered   08/19/20 1548  Home Health  At discharge       Question Answer Comment  To provide the following care/treatments PT   To provide the following care/treatments OT & SLP      08/19/20 1547           Equipment/Devices: None. Per TOC, patient has shower seat at home and elevated commode so they refused recommended 3 n 1    Discharge Condition: Improved and stable   Code Status: Full Code Diet recommendation:  Discharge Diet Orders (From admission, onward)    Start     Ordered   08/19/20 0000  Diet - low sodium heart healthy        08/19/20 1611   08/19/20 0000  Diet Carb Modified        08/19/20 1611           Discharge Diagnoses:  Principal  Problem:   TIA (transient ischemic attack) Active Problems:   Hyperlipidemia   Essential hypertension   Hypokalemia   Hyperglycemia   Brief Summary: 80 year old married female, medical history significant for but not limited to atrial tachycardia originating from the His bundle region, underwent successful ablation of this tachycardia approximately 5 years ago, no history of documented A. fib, carotid artery disease, CAD s/p PCI 2016, hypertension, hyperlipidemia, type II DM, presented to the ED via EMS for evaluation of confusion, slurred speech and word finding difficulty which started around 7 PM on 08/18/2020 and resolved after about 40 minutes to an hour.  Admitted for acute stroke and underwent evaluation and management.  Neurology and Cardiology were consulted.  HPI as per H&P by Dr. John Giovanni on 08/19/2020 as follows:  HPI: Maria [name] is a 80 y.o. female with medical history significant of carotid artery disease, CAD status post PCI in 2016, hypertension, hyperlipidemia, type 2 diabetes, PSVT status post catheter ablation in 2016 presented to the ED via EMS for evaluation of confusion, slurred speech, and word finding difficulty which started around 7 PM on 08/18/2020.  Symptoms lasted about 40 minutes to an hour and resolved before EMS arrived.  Blood pressure significantly elevated with systolic above  200 upon arrival to the ED.  Labs showing WBC 9.4, hemoglobin 11.4 (stable), platelet count 372K.  Sodium 139, potassium 3.4, chloride 103, bicarb 26, BUN 12, creatinine 0.9, glucose 337.  INR 1.0.  Screening Covid and influenza PCR negative.  CT head negative for acute or evolving ischemia.  Showing moderate to advanced chronic microvascular ischemic disease and a probable 7 mm hemangioma overlying the right frontal convexity without associated mass-effect.  CTA head and neck showing occlusion of the distal right A2 segment, age-indeterminate.  Attenuated and irregular flow is seen  distally within the right ACA branches, likely collateral.  Mild atherosclerotic change about the carotic bifurcations and carotid siphons without hemodynamically significant stenosis.  Diffuse small vessel atheromatous irregularity elsewhere throughout the intracranial circulation. Neurology consulted and recommended admission for TIA work-up.  Patient states she was talking to her daughter on the phone and became confused and was pressing the wrong buttons.  She was also having difficulty with her speech.  She did not have any weakness in her arm or leg.  Did not have any changes in her vision.  She is not sure how long her symptoms lasted.  Denies history of prior stroke or TIA.  She does not smoke cigarettes.  States she has a history of high blood pressure but is not on any medications as per decision from her primary care physician and cardiologist.  She does not take any blood thinners.  She has no other complaints.  Denies fevers, cough, shortness of breath, chest pain, nausea, vomiting, abdominal pain, or diarrhea.  Assessment and plan:  Acute ischemic stroke:  Neurology/stroke MD consulted and assisted with extensive evaluation and management.  She presented with reports of confusion, apraxia and word finding difficulty.  These have resolved without recurrence.  CT head without acute finding.  Probable 7 mm hemangioma overlying the right frontal convexity without associated mass-effect.  CTA head and neck: Distal right A2 occlusion.  MRI brain: Small acute on chronic white matter infarct in the right corona radiator, right superior frontal gyrus.  2D echo: LVEF 60-65%.  LDL: 127  A1c: 9.4.  Therapies have evaluated and recommend home health PT and OT, arranged by TOC.  Speech therapy evaluation pending.  Not on antithrombotics prior to admission.  As per neurology, now on aspirin 325 mg daily + Plavix 75 mg daily for 3 months followed by aspirin 325 mg daily alone given large  vessel stenosis/occlusion.  As per neurology, etiology of acute on chronic right ACA small infarct likely due to the right A2 occlusion.  However this likely would not explain patient's confusion, apraxia and word finding difficulty.  There was still concern for left MCA cortical TIA.  Given prior history of atrial tachycardia s/p successful ablation, reported multiple falls, no clear syncope, there was concern that patient may have paroxysmal A. fib or other irregular heartbeat placing her at increased risk of stroke secondary to cardioembolic etiology.  Thereby neurology consulted EP cardiology and loop recorder was placed on 2/7.  She will follow-up outpatient with cardiology.  Outpatient neurology follow-up.  History of atrial tachycardia/paroxysmal SVT, s/p successful ablation  Most recently seen by Dr. Eden EmmsNishan, Cardiology on 08/16/2020.  Now status post loop recorder placement 4/7 and will follow up with EP cardiology.  Hyperlipidemia  Intolerant of statins in the past.  LDL 127, goal <70  Continue prior home medication i.e. Nexlizet  Could consider referring patient to advanced lipid management clinic/Dr. Hilty  Type II DM, uncontrolled  A1c 9.4 suggests poor outpatient control.  Goal <7.  Continue prior home oral hypoglycemics but recommend close outpatient follow-up with PCP for improved control.  Essential hypertension  Presented initially with marked hypertension as noted above.  Blood pressures have gradually and spontaneously improved without intervention.  Close outpatient follow-up and home BP monitoring to consider need for initiation of antihypertensives.  Frequent falls  As per cardiology, no syncopal component per spouse, the most recent was months ago.  Therapies evaluated and recommended home health services as above.  Hypokalemia/hypomagnesemia  Replaced prior to discharge.  Follow labs as outpatient.  Anemia, possibly of chronic  disease  Stable.  Weight loss  Patient/family reported to RN that she has lost substantial amount of weight over the last several months.  Recommend close outpatient follow-up with PCP for further evaluation and management.  Pain  Confirmed with patient and spouse that patient takes both OxyIR and tramadol for pain.  I advised him that this is dangerous and can cause serious side effects which can include and not limited to worsening mental status changes/confusion, fall risk, constipation etc.  I have clearly stated to them that she should follow-up closely outpatient with her physicians and consider stopping 1 of those medicines at least.  They verbalized understanding.   Consultations:  Neurology  EP Cardiology  Procedures:  Loop recorder placement 4/7   Discharge Instructions  Discharge Instructions    Ambulatory referral to Neurology   Complete by: As directed    Follow up with stroke clinic NP (Jessica Vanschaick or Darrol Angel, if both not available, consider Manson Allan, or Ahern) at [facility] in about 4 weeks. Thanks.   Call MD for:   Complete by: As directed    Recurrent strokelike symptoms, recurrent confusion, speech difficulties or word finding difficulties.   Diet - low sodium heart healthy   Complete by: As directed    Diet Carb Modified   Complete by: As directed    Increase activity slowly   Complete by: As directed        Medication List    TAKE these medications   aspirin 325 MG EC tablet Take 1 tablet (325 mg total) by mouth daily. Start taking on: August 20, 2020   clopidogrel 75 MG tablet Commonly known as: PLAVIX Take 1 tablet (75 mg total) by mouth daily. Start taking on: August 20, 2020   cyanocobalamin 1000 MCG/ML injection Commonly known as: (VITAMIN B-12) 1000 mcg injected IM every 14 days.   feeding supplement Liqd Take 237 mLs by mouth 2 (two) times daily between meals. Start taking on: August 20, 2020   glyBURIDE 5 MG  tablet Commonly known as: DIABETA Take 1 tablet (5 mg total) by mouth daily.   loperamide 2 MG capsule Commonly known as: IMODIUM Take 2 mg by mouth 4 (four) times daily as needed for diarrhea or loose stools.   Nexlizet 180-10 MG Tabs Generic drug: Bempedoic Acid-Ezetimibe Take 1 tablet by mouth daily.   nitroGLYCERIN 0.4 MG SL tablet Commonly known as: NITROSTAT PLACE 1 TABLET UNDER THE TONGUE EVERY 5 MINUTES AS NEEDED FOR CHEST PAIN FOR UP TO 3 DOSES   omeprazole 20 MG capsule Commonly known as: PRILOSEC Take 1 capsule (20 mg total) by mouth 2 (two) times daily as needed (for acid reflux/indigestion.).   OneTouch Ultra test strip Generic drug: glucose blood USE AS INSTRUCTED TO TEST BLOOD SUGAR THREE TIMES DAILY.   oxyCODONE 5 MG immediate release tablet Commonly known as: Oxy  IR/ROXICODONE Take 5 mg by mouth every 6 (six) hours as needed.   traMADol 50 MG tablet Commonly known as: ULTRAM TAKE 1 TABLET BY MOUTH EVERY 6 HOURS AS NEEDED      Allergies  Allergen Reactions  . Crestor [Rosuvastatin] Other (See Comments)    MYALGIAS  . Ezetimibe-Simvastatin Other (See Comments)    MYALGIAS  . Pravastatin Other (See Comments)    Myalgias  . Diazepam     Balance difficulty.    . Ezetimibe   . Lyrica [Pregabalin]     swelling  . Nsaids     UNSPECIFIED REACTION   . Praluent [Alirocumab]     myalgias  . Repatha [Evolocumab]     Hoarse voice  . Acetaminophen Anxiety and Other (See Comments)    REACTION: jittery with plain tylenol, but tolerated tylenol/benadryl combination  . Carvedilol Other (See Comments)    nightmares  . Gabapentin Diarrhea  . Ibuprofen Other (See Comments)    REACTION: GI upset at high doses  . Insulin Glargine Rash    Rash  . Levemir [Insulin Detemir] Rash  . Macrobid [Nitrofurantoin Macrocrystal] Nausea And Vomiting and Other (See Comments)    ACHES  . Metformin Nausea And Vomiting    REACTION: GI upset  . Metoprolol Tartrate Other (See  Comments)    REACTION: nightmares  . Naproxen Nausea And Vomiting    REACTION: GI upset      Procedures/Studies: CT Angio Head W/Cm &/Or Wo Cm  Result Date: 08/19/2020 CLINICAL DATA:  Initial evaluation for acute TIA. EXAM: CT ANGIOGRAPHY HEAD AND NECK TECHNIQUE: Multidetector CT imaging of the head and neck was performed using the standard protocol during bolus administration of intravenous contrast. Multiplanar CT image reconstructions and MIPs were obtained to evaluate the vascular anatomy. Carotid stenosis measurements (when applicable) are obtained utilizing NASCET criteria, using the distal internal carotid diameter as the denominator. CONTRAST:  75mL OMNIPAQUE IOHEXOL 350 MG/ML SOLN COMPARISON:  Prior head CT from 09/21/2019 FINDINGS: CT HEAD FINDINGS Brain: Age-related cerebral atrophy with moderate to advanced chronic microvascular ischemic disease. Remote lacunar infarcts at the left basal ganglia and right frontal centrum semi ovale. No acute intracranial hemorrhage. No acute large vessel territory infarct. Suspected 7 mm meningioma overlying the right frontal convexity without significant mass effect (series 11, image 67). No other mass lesion, midline shift or mass effect. No hydrocephalus or extra-axial fluid collection. Vascular: No hyperdense vessel. Scattered vascular calcifications noted within the carotid siphons. Skull: Scalp soft tissues and calvarium within normal limits. Sinuses: Paranasal sinuses mastoid air cells are clear. Orbits: Globes and orbital soft tissues demonstrate no acute finding. Review of the MIP images confirms the above findings CTA NECK FINDINGS Aortic arch: Visualized aortic arch normal caliber with normal 3 vessel morphology. Mild atheromatous change about the origin of the great vessels without hemodynamically significant stenosis. Right carotid system: Right CCA patent from its origin to the bifurcation without stenosis. Mild calcified plaque about the right  carotid bulb/proximal right ICA without significant stenosis. Right ICA widely patent distally without stenosis, dissection or occlusion. Left carotid system: Left CCA patent from its origin to the bifurcation without stenosis. Mild eccentric mixed plaque about the left carotid bulb without significant stenosis. Left ICA patent distally without stenosis, dissection or occlusion. Vertebral arteries: Both vertebral arteries arise from subclavian arteries. Vertebral arteries widely patent without stenosis, dissection or occlusion. Skeleton: No visible acute osseous finding. No discrete or worrisome osseous lesions. Prior fusion at C4-C6. Grade 1 anterolisthesis  of C7 on T1. Other neck: No other acute soft tissue abnormality within the neck. No mass or adenopathy. 1.3 cm left thyroid nodule, felt to be of doubtful significance given size and patient age, no follow-up imaging recommended (ref: J Am Coll Radiol. 2015 Feb;12(2): 143-50). Upper chest: Visualized upper chest demonstrates no acute finding. Review of the MIP images confirms the above findings CTA HEAD FINDINGS Anterior circulation: Petrous segments patent bilaterally. Scattered atheromatous change within the carotid siphons with associated mild multifocal narrowing. A1 segments patent bilaterally. Normal anterior communicating artery complex. Left ACA widely patent to its distal aspect. There is age indeterminate occlusion of the distal right A2 segment (series 11, image 86). Attenuated irregular flow seen distally within right ACA branches, likely collateral in nature. M1 segments patent without stenosis. Normal MCA bifurcations. No proximal MCA branch occlusion. Diffuse small vessel atheromatous irregularity throughout the MCA branches bilaterally. Posterior circulation: Both V4 segments patent to the vertebrobasilar junction without stenosis. Right vertebral slightly dominant. Both PICA origins patent and normal. Basilar patent to its distal aspect without  stenosis. Superior cerebellar arteries patent bilaterally. Both PCAs primarily supplied via the basilar. Atheromatous irregularity within both PCAs without high-grade stenosis. Venous sinuses: Grossly patent allowing for timing the contrast bolus. Anatomic variants: None significant.  No aneurysm. Review of the MIP images confirms the above findings IMPRESSION: CT HEAD IMPRESSION: 1. No acute intracranial abnormality identified. Specifically, no evidence for acute or evolving ischemia. 2. Moderate to advanced chronic microvascular ischemic disease. 3. Probable 7 mm hemangioma overlying the right frontal convexity without associated mass effect. CTA HEAD AND NECK IMPRESSION: 1. 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 Attenuated and irregular flow is seen distally within right ACA branches, likely collateral. 2. Mild atherosclerotic change about the carotid bifurcations and carotid siphons without hemodynamically significant stenosis. 3. Diffuse small vessel atheromatous irregularity elsewhere throughout the intracranial circulation Electronically Signed   By: Rise Mu M.D.   On: 08/19/2020 00:33   CT Angio Neck W and/or Wo Contrast  Result Date: 08/19/2020 CLINICAL DATA:  Initial evaluation for acute TIA. EXAM: CT ANGIOGRAPHY HEAD AND NECK TECHNIQUE: Multidetector CT imaging of the head and neck was performed using the standard protocol during bolus administration of intravenous contrast. Multiplanar CT image reconstructions and MIPs were obtained to evaluate the vascular anatomy. Carotid stenosis measurements (when applicable) are obtained utilizing NASCET criteria, using the distal internal carotid diameter as the denominator. CONTRAST:  75mL OMNIPAQUE IOHEXOL 350 MG/ML SOLN COMPARISON:  Prior head CT from 09/21/2019 FINDINGS: CT HEAD FINDINGS Brain: Age-related cerebral atrophy with moderate to advanced chronic microvascular ischemic disease. Remote lacunar infarcts at the left  basal ganglia and right frontal centrum semi ovale. No acute intracranial hemorrhage. No acute large vessel territory infarct. Suspected 7 mm meningioma overlying the right frontal convexity without significant mass effect (series 11, image 67). No other mass lesion, midline shift or mass effect. No hydrocephalus or extra-axial fluid collection. Vascular: No hyperdense vessel. Scattered vascular calcifications noted within the carotid siphons. Skull: Scalp soft tissues and calvarium within normal limits. Sinuses: Paranasal sinuses mastoid air cells are clear. Orbits: Globes and orbital soft tissues demonstrate no acute finding. Review of the MIP images confirms the above findings CTA NECK FINDINGS Aortic arch: Visualized aortic arch normal caliber with normal 3 vessel morphology. Mild atheromatous change about the origin of the great vessels without hemodynamically significant stenosis. Right carotid system: Right CCA patent from its origin to the bifurcation without stenosis. Mild calcified  plaque about the right carotid bulb/proximal right ICA without significant stenosis. Right ICA widely patent distally without stenosis, dissection or occlusion. Left carotid system: Left CCA patent from its origin to the bifurcation without stenosis. Mild eccentric mixed plaque about the left carotid bulb without significant stenosis. Left ICA patent distally without stenosis, dissection or occlusion. Vertebral arteries: Both vertebral arteries arise from subclavian arteries. Vertebral arteries widely patent without stenosis, dissection or occlusion. Skeleton: No visible acute osseous finding. No discrete or worrisome osseous lesions. Prior fusion at C4-C6. Grade 1 anterolisthesis of C7 on T1. Other neck: No other acute soft tissue abnormality within the neck. No mass or adenopathy. 1.3 cm left thyroid nodule, felt to be of doubtful significance given size and patient age, no follow-up imaging recommended (ref: J Am Coll Radiol.  2015 Feb;12(2): 143-50). Upper chest: Visualized upper chest demonstrates no acute finding. Review of the MIP images confirms the above findings CTA HEAD FINDINGS Anterior circulation: Petrous segments patent bilaterally. Scattered atheromatous change within the carotid siphons with associated mild multifocal narrowing. A1 segments patent bilaterally. Normal anterior communicating artery complex. Left ACA widely patent to its distal aspect. There is age indeterminate occlusion of the distal right A2 segment (series 11, image 86). Attenuated irregular flow seen distally within right ACA branches, likely collateral in nature. M1 segments patent without stenosis. Normal MCA bifurcations. No proximal MCA branch occlusion. Diffuse small vessel atheromatous irregularity throughout the MCA branches bilaterally. Posterior circulation: Both V4 segments patent to the vertebrobasilar junction without stenosis. Right vertebral slightly dominant. Both PICA origins patent and normal. Basilar patent to its distal aspect without stenosis. Superior cerebellar arteries patent bilaterally. Both PCAs primarily supplied via the basilar. Atheromatous irregularity within both PCAs without high-grade stenosis. Venous sinuses: Grossly patent allowing for timing the contrast bolus. Anatomic variants: None significant.  No aneurysm. Review of the MIP images confirms the above findings IMPRESSION: CT HEAD IMPRESSION: 1. No acute intracranial abnormality identified. Specifically, no evidence for acute or evolving ischemia. 2. Moderate to advanced chronic microvascular ischemic disease. 3. Probable 7 mm hemangioma overlying the right frontal convexity without associated mass effect. CTA HEAD AND NECK IMPRESSION: 1. 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 Attenuated and irregular flow is seen distally within right ACA branches, likely collateral. 2. Mild atherosclerotic change about the carotid bifurcations and carotid  siphons without hemodynamically significant stenosis. 3. Diffuse small vessel atheromatous irregularity elsewhere throughout the intracranial circulation Electronically Signed   By: Rise Mu M.D.   On: 08/19/2020 00:33   MR BRAIN WO CONTRAST  Result Date: 08/19/2020 CLINICAL DATA:  80 year old female TIA. Age indeterminate right ACA distal A2 segment occlusion on CTA yesterday. EXAM: MRI HEAD WITHOUT CONTRAST TECHNIQUE: Multiplanar, multiecho pulse sequences of the brain and surrounding structures were obtained without intravenous contrast. COMPARISON:  CTA head and neck yesterday. Head CT 09/21/2019 and earlier. Brain MRI 11/24/2005. FINDINGS: Brain: Small area of partially restricted diffusion in the right superior frontal gyrus white matter (series 2, images 39-41) which surrounds a small cystic area of right corona radiata white matter encephalomalacia. No other restricted diffusion. Underlying confluent bilateral white matter T2 and FLAIR signal abnormality which has substantially progressed since the 2007 MRI. Fairly symmetric involvement in both hemispheres. Superimposed mild to moderate T2 heterogeneity throughout the bilateral deep gray matter nuclei and central pons. Some of the basal ganglia signal changes appear due to dilated perivascular spaces. No superimposed cortical encephalomalacia. Small chronic microhemorrhage in the left temporal lobe white matter  on series 7, image 39. No other chronic cerebral blood products. No midline shift, mass effect, evidence of mass lesion, ventriculomegaly, extra-axial collection or acute intracranial hemorrhage. Cervicomedullary junction and pituitary are within normal limits. Vascular: Major intracranial vascular flow voids are stable since 2007. Skull and upper cervical spine: Chronic cervical ACDF, otherwise normal for age visible cervical spine. Visualized bone marrow signal is within normal limits. Sinuses/Orbits: Postoperative changes to both  globes. Paranasal Visualized paranasal sinuses and mastoids are stable and well aerated. Other: Grossly normal visible internal auditory structures. Visible scalp and face soft tissues appear negative. IMPRESSION: 1. Small acute on chronic white matter infarct at the right corona radiata, right superior frontal gyrus. No associated hemorrhage or mass effect. 2. No other acute ischemia or acute intracranial abnormality. Underlying advanced bilateral white matter disease and moderate signal changes in the bilateral deep gray nuclei and pons also likely due to chronic small vessel ischemia. Electronically Signed   By: Odessa Fleming M.D.   On: 08/19/2020 07:18   EP PPM/ICD IMPLANT  Result Date: 08/19/2020 CONCLUSIONS:  1. Successful implantation of a [employer] Reveal LINQ implantable loop recorder for cryptogenic stroke  2. No early apparent complications. Lewayne Bunting, MD 08/19/2020 1:43 PM   ECHOCARDIOGRAM COMPLETE  Result Date: 08/19/2020    ECHOCARDIOGRAM REPORT   Patient Name:   Maria [name] Date of Exam: 08/19/2020 Medical Rec #:  [id number]      Height:       65.0 in Accession #:    [id number]     Weight:       150.0 lb Date of Birth:  [birth year]      BSA:          1.750 m Patient Age:    80 years       BP:           153/87 mmHg Patient Gender: F              HR:           83 bpm. Exam Location:  Inpatient Procedure: 2D Echo, Cardiac Doppler and Color Doppler Indications:    TIA  History:        Patient has prior history of Echocardiogram examinations, most                 recent 11/03/2014. CAD, Abnormal ECG and SVT, TIA,                 Signs/Symptoms:Syncope and Chest Pain; Risk                 Factors:Hypertension, Dyslipidemia and Diabetes.  Sonographer:    Sheralyn Boatman RDCS Referring Phys: [id number] Zakyia Gagan D Jovante Hammitt  Sonographer Comments: Technically difficult study due to poor echo windows. IMPRESSIONS  1. Left ventricular ejection fraction, by estimation, is 60 to 65%. The left ventricle has normal function. The left  ventricle has no regional wall motion abnormalities. Left ventricular diastolic parameters are consistent with Grade II diastolic dysfunction (pseudonormalization). Elevated left ventricular end-diastolic pressure.  2. Right ventricular systolic function is normal. The right ventricular size is normal. There is normal pulmonary artery systolic pressure.  3. The mitral valve is abnormal. No evidence of mitral valve regurgitation. No evidence of mitral stenosis.  4. The aortic valve is tricuspid. There is mild calcification of the aortic valve. Aortic valve regurgitation is not visualized. Mild aortic valve sclerosis is present, with no evidence of aortic valve stenosis.  5. The inferior vena  cava is normal in size with greater than 50% respiratory variability, suggesting right atrial pressure of 3 mmHg. FINDINGS  Left Ventricle: Left ventricular ejection fraction, by estimation, is 60 to 65%. The left ventricle has normal function. The left ventricle has no regional wall motion abnormalities. The left ventricular internal cavity size was normal in size. There is  no left ventricular hypertrophy. Left ventricular diastolic parameters are consistent with Grade II diastolic dysfunction (pseudonormalization). Elevated left ventricular end-diastolic pressure. Right Ventricle: The right ventricular size is normal. No increase in right ventricular wall thickness. Right ventricular systolic function is normal. There is normal pulmonary artery systolic pressure. The tricuspid regurgitant velocity is 2.57 m/s, and  with an assumed right atrial pressure of 8 mmHg, the estimated right ventricular systolic pressure is 34.4 mmHg. Left Atrium: Left atrial size was normal in size. Right Atrium: Right atrial size was normal in size. Pericardium: There is no evidence of pericardial effusion. Mitral Valve: The mitral valve is abnormal. There is mild calcification of the mitral valve leaflet(s). Mild mitral annular calcification. No  evidence of mitral valve regurgitation. No evidence of mitral valve stenosis. Tricuspid Valve: The tricuspid valve is normal in structure. Tricuspid valve regurgitation is not demonstrated. No evidence of tricuspid stenosis. Aortic Valve: The aortic valve is tricuspid. There is mild calcification of the aortic valve. Aortic valve regurgitation is not visualized. Mild aortic valve sclerosis is present, with no evidence of aortic valve stenosis. Pulmonic Valve: The pulmonic valve was normal in structure. Pulmonic valve regurgitation is mild. No evidence of pulmonic stenosis. Aorta: The aortic root is normal in size and structure. Venous: The inferior vena cava is normal in size with greater than 50% respiratory variability, suggesting right atrial pressure of 3 mmHg. IAS/Shunts: No atrial level shunt detected by color flow Doppler.  LEFT VENTRICLE PLAX 2D LVIDd:         3.70 cm     Diastology LVIDs:         2.30 cm     LV e' medial:    4.29 cm/s LV PW:         1.00 cm     LV E/e' medial:  20.7 LV IVS:        1.30 cm     LV e' lateral:   4.73 cm/s LVOT diam:     1.90 cm     LV E/e' lateral: 18.7 LV SV:         53 LV SV Index:   30 LVOT Area:     2.84 cm  LV Volumes (MOD) LV vol d, MOD A2C: 48.0 ml LV vol d, MOD A4C: 36.1 ml LV vol s, MOD A2C: 20.6 ml LV vol s, MOD A4C: 10.9 ml LV SV MOD A2C:     27.4 ml LV SV MOD A4C:     36.1 ml LV SV MOD BP:      26.9 ml RIGHT VENTRICLE RV S prime:     9.03 cm/s TAPSE (M-mode): 1.4 cm LEFT ATRIUM           Index       RIGHT ATRIUM           Index LA diam:      3.10 cm 1.77 cm/m  RA Area:     10.20 cm LA Vol (A2C): 14.2 ml 8.11 ml/m  RA Volume:   20.90 ml  11.94 ml/m LA Vol (A4C): 24.6 ml 14.05 ml/m  AORTIC VALVE  PULMONIC VALVE LVOT Vmax:   87.50 cm/s  PR End Diast Vel: 1.57 msec LVOT Vmean:  59.200 cm/s LVOT VTI:    0.187 m  AORTA Ao Root diam: 2.90 cm Ao Asc diam:  3.30 cm MITRAL VALVE                TRICUSPID VALVE MV Area (PHT): 3.03 cm     TR Peak grad:   26.4  mmHg MV Decel Time: 250 msec     TR Vmax:        257.00 cm/s MV E velocity: 88.60 cm/s MV A velocity: 104.00 cm/s  SHUNTS MV E/A ratio:  0.85         Systemic VTI:  0.19 m                             Systemic Diam: 1.90 cm Charlton Haws MD Electronically signed by Charlton Haws MD Signature Date/Time: 08/19/2020/12:10:06 PM    Final     Subjective: Interviewed and examined patient in the presence of her spouse at bedside.  Patient denies complaints.  Both indicate that she is back to her baseline.  No recurrence of confusion, word finding difficulties or any other strokelike symptoms.  Discharge Exam:  Vitals:   08/19/20 1015 08/19/20 1207 08/19/20 1400 08/19/20 1627  BP: (!) 156/81 132/63 (!) 142/70 (!) 152/80  Pulse:  87 85 93  Resp: 18 (!) 22 18 18   Temp: 98.6 F (37 C) 98.6 F (37 C) 98 F (36.7 C) 98.2 F (36.8 C)  TempSrc: Oral Oral Oral Oral  SpO2:  97% 97% 98%  Weight:      Height:        General: Elderly female, small built and frail lying comfortably supine in bed without distress. Cardiovascular: S1 & S2 heard, RRR, S1/S2 +. No murmurs, rubs, gallops or clicks. No JVD or pedal edema.  Telemetry personally reviewed: Sinus rhythm with BBB morphology. Respiratory: Clear to auscultation without wheezing, rhonchi or crackles. No increased work of breathing. Abdominal:  Non distended, non tender & soft. No organomegaly or masses appreciated. Normal bowel sounds heard. CNS: Alert and oriented. No focal deficits. Extremities: no edema, no cyanosis    The results of significant diagnostics from this hospitalization (including imaging, microbiology, ancillary and laboratory) are listed below for reference.     Microbiology: Recent Results (from the past 240 hour(s))  Resp Panel by RT-PCR (Flu A&B, Covid) Nasopharyngeal Swab     Status: None   Collection Time: 08/18/20 10:31 PM   Specimen: Nasopharyngeal Swab; Nasopharyngeal(NP) swabs in vial transport medium  Result Value Ref  Range Status   SARS Coronavirus 2 by RT PCR NEGATIVE NEGATIVE Final    Comment: (NOTE) SARS-CoV-2 target nucleic acids are NOT DETECTED.  The SARS-CoV-2 RNA is generally detectable in upper respiratory specimens during the acute phase of infection. The lowest concentration of SARS-CoV-2 viral copies this assay can detect is 138 copies/mL. A negative result does not preclude SARS-Cov-2 infection and should not be used as the sole basis for treatment or other patient management decisions. A negative result may occur with  improper specimen collection/handling, submission of specimen other than nasopharyngeal swab, presence of viral mutation(s) within the areas targeted by this assay, and inadequate number of viral copies(<138 copies/mL). A negative result must be combined with clinical observations, patient history, and epidemiological information. The expected result is Negative.  Fact Sheet for Patients:  10/18/20  Fact Sheet for Healthcare Providers:  SeriousBroker.it  This test is no t yet approved or cleared by the [place] FDA and  has been authorized for detection and/or diagnosis of SARS-CoV-2 by FDA under an Emergency Use Authorization (EUA). This EUA will remain  in effect (meaning this test can be used) for the duration of the COVID-19 declaration under Section 564(b)(1) of the Act, 21 U.S.C.section 360bbb-3(b)(1), unless the authorization is terminated  or revoked sooner.       Influenza A by PCR NEGATIVE NEGATIVE Final   Influenza B by PCR NEGATIVE NEGATIVE Final    Comment: (NOTE) The Xpert Xpress SARS-CoV-2/FLU/RSV plus assay is intended as an aid in the diagnosis of influenza from Nasopharyngeal swab specimens and should not be used as a sole basis for treatment. Nasal washings and aspirates are unacceptable for Xpert Xpress SARS-CoV-2/FLU/RSV testing.  Fact Sheet for  Patients: BloggerCourse.com  Fact Sheet for Healthcare Providers: SeriousBroker.it  This test is not yet approved or cleared by the [place] FDA and has been authorized for detection and/or diagnosis of SARS-CoV-2 by FDA under an Emergency Use Authorization (EUA). This EUA will remain in effect (meaning this test can be used) for the duration of the COVID-19 declaration under Section 564(b)(1) of the Act, 21 U.S.C. section 360bbb-3(b)(1), unless the authorization is terminated or revoked.  Performed at [facility], [address] [place], [place] [address]      Labs: CBC: Recent Labs  Lab 08/18/20 2117 08/19/20 0606  WBC 9.4 9.5  NEUTROABS 5.9  --   HGB 11.4* 11.8*  HCT 33.5* 35.0*  MCV 91.8 92.3  PLT 372 345    Basic Metabolic Panel: Recent Labs  Lab 08/18/20 2117 08/19/20 0606  NA 139 139  K 3.4* 4.0  CL 103 105  CO2 26 28  GLUCOSE 337* 237*  BUN 12 9  CREATININE 0.93 0.77  CALCIUM 8.8* 9.1  MG  --  1.6*    Liver Function Tests: No results for input(s): AST, ALT, ALKPHOS, BILITOT, PROT, ALBUMIN in the last 168 hours.  CBG: Recent Labs  Lab 08/19/20 0859 08/19/20 1213 08/19/20 1635  GLUCAP 195* 197* 137*    Hgb A1c Recent Labs    08/19/20 0606  HGBA1C 9.4*    Lipid Profile Recent Labs    08/19/20 0606  CHOL 190  HDL 46  LDLCALC 127*  TRIG 85  CHOLHDL 4.1    Thyroid function studies Recent Labs    08/19/20 0606  TSH 1.970    Discussed in detail with patient spouse at bedside, updated care and answered all questions  Time coordinating discharge: 45 minutes  SIGNED:  Marcellus Scott, MD, FACP, [facility]. [facility]  To contact the attending provider between 7A-7P or the covering provider during after hours 7P-7A, please log into the web site [email or web address] and access using universal [place] password for that web site. If you do not have the password, please  call the hospital operator.

## 2020-08-19 NOTE — ED Notes (Signed)
Night shift RN reported this  Pt was to be transported up to 3W08 after she returns from MRI around shift change. Tech is in room now, preparing her for transport.

## 2020-08-19 NOTE — Evaluation (Signed)
Physical Therapy Evaluation Patient Details Name: Maria [name] MRN: [id number] DOB: 05-22-1940 Today's Date: 08/19/2020   History of Present Illness  Pt is an 80 y/o female admitted 4/6 secondary to slurred speech and difficulty with word finding. 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 PMH includes HTN, CAD, and DM.  Clinical Impression  Pt admitted secondary to problem above with deficits below. Pt reporting increased weakness in BLE and increased fatigue which limited mobility tolerance this session. Improved balance noted with use of RW, so educated about use at home to increase safety. Feel pt would benefit from continued acute therapies prior to return home to increase safety with mobility. Will continue to follow acutely.     Follow Up Recommendations Home health PT;Supervision for mobility/OOB    Equipment Recommendations  None recommended by PT    Recommendations for Other Services       Precautions / Restrictions Precautions Precautions: Fall Restrictions Weight Bearing Restrictions: No      Mobility  Bed Mobility Overal bed mobility: Needs Assistance Bed Mobility: Supine to Sit;Sit to Supine     Supine to sit: Min assist;Mod assist Sit to supine: Supervision   General bed mobility comments: Pt requiring assist for trunk and scooting hips to EOB to come to sitting.    Transfers Overall transfer level: Needs assistance Equipment used: 1 person hand held assist;Rolling walker (2 wheeled) Transfers: Sit to/from Stand Sit to Stand: Min assist;Min guard         General transfer comment: Pt with difficulty standing without use of AD. Required min A for lift assist and steadying. Improved steadiness noted with use of RW. Pt requiring min guard A when using RW.  Ambulation/Gait Ambulation/Gait assistance: Min guard Gait Distance (Feet): 20 Feet Assistive device: Rolling walker (2 wheeled) Gait Pattern/deviations: Step-through pattern;Decreased stride  length Gait velocity: Decreased   General Gait Details: Only able to tolerate short distance and she reports her legs felt really weak. Per husband, this occasionally happens to the pt at home. Educated about using RW for increased safety.  Stairs            Wheelchair Mobility    Modified Rankin (Stroke Patients Only) Modified Rankin (Stroke Patients Only) Pre-Morbid Rankin Score: No significant disability Modified Rankin: Moderately severe disability     Balance Overall balance assessment: Needs assistance Sitting-balance support: No upper extremity supported;Feet supported Sitting balance-Leahy Scale: Fair     Standing balance support: Bilateral upper extremity supported;During functional activity Standing balance-Leahy Scale: Poor Standing balance comment: Reliant on BUE support                             Pertinent Vitals/Pain Pain Assessment: Faces Faces Pain Scale: Hurts little more Pain Location: back Pain Descriptors / Indicators: Grimacing;Guarding Pain Intervention(s): Limited activity within patient's tolerance;Monitored during session;Repositioned    Home Living Family/patient expects to be discharged to:: Private residence Living Arrangements: Spouse/significant other Available Help at Discharge: Family;Available 24 hours/day Type of Home: House Home Access: Ramped entrance;Stairs to enter Entrance Stairs-Rails: Right;Left Entrance Stairs-Number of Steps: 4 Home Layout: One level Home Equipment: Cane - single point;Walker - 2 wheels;Shower seat      Prior Function Level of Independence: Independent with assistive device(s)         Comments: Uses cane for ambulatioin     Hand Dominance        Extremity/Trunk Assessment   Upper Extremity Assessment Upper Extremity Assessment:  Defer to OT evaluation    Lower Extremity Assessment Lower Extremity Assessment: Generalized weakness;RLE deficits/detail;LLE deficits/detail RLE  Deficits / Details: Reports neuropathy up to knees LLE Deficits / Details: Reports neuropathy throughout LLE.    Cervical / Trunk Assessment Cervical / Trunk Assessment: Kyphotic  Communication   Communication: No difficulties  Cognition Arousal/Alertness: Awake/alert Behavior During Therapy: WFL for tasks assessed/performed Overall Cognitive Status: Impaired/Different from baseline Area of Impairment: Problem solving                             Problem Solving: Slow processing        General Comments General comments (skin integrity, edema, etc.): Pt's husband present during session.    Exercises     Assessment/Plan    PT Assessment Patient needs continued PT services  PT Problem List Decreased strength;Decreased activity tolerance;Decreased balance;Decreased mobility;Decreased knowledge of use of DME;Impaired sensation       PT Treatment Interventions DME instruction;Gait training;Functional mobility training;Therapeutic activities;Therapeutic exercise;Balance training;Patient/family education    PT Goals (Current goals can be found in the Care Plan section)  Acute Rehab PT Goals Patient Stated Goal: to feel better and go home PT Goal Formulation: With patient Time For Goal Achievement: 09/02/20 Potential to Achieve Goals: Good    Frequency Min 4X/week   Barriers to discharge        Co-evaluation               AM-PAC PT "6 Clicks" Mobility  Outcome Measure Help needed turning from your back to your side while in a flat bed without using bedrails?: A Little Help needed moving from lying on your back to sitting on the side of a flat bed without using bedrails?: A Lot Help needed moving to and from a bed to a chair (including a wheelchair)?: A Little Help needed standing up from a chair using your arms (e.g., wheelchair or bedside chair)?: A Little Help needed to walk in hospital room?: A Little Help needed climbing 3-5 steps with a railing? : A  Lot 6 Click Score: 16    End of Session Equipment Utilized During Treatment: Gait belt Activity Tolerance: Patient limited by fatigue Patient left: in bed;with call bell/phone within reach;with bed alarm set Nurse Communication: Mobility status PT Visit Diagnosis: Unsteadiness on feet (R26.81);Muscle weakness (generalized) (M62.81)    Time: 2751-7001 PT Time Calculation (min) (ACUTE ONLY): 16 min   Charges:   PT Evaluation $PT Eval Moderate Complexity: 1 Mod          Farley Ly, PT, DPT  Acute Rehabilitation Services  Pager: [phone] Office: [phone]   Lehman Prom 08/19/2020, 10:06 AM

## 2020-08-19 NOTE — ED Notes (Signed)
Pt transported to MRI 

## 2020-08-19 NOTE — Evaluation (Signed)
Occupational Therapy Evaluation Patient Details Name: Maria [name] MRN: [id number] DOB: 1940-12-03 Today's Date: 08/19/2020    History of Present Illness Pt is an 80 y/o female admitted 4/6 secondary to slurred speech and difficulty with word finding. 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 PMH includes HTN, CAD, and DM.   Clinical Impression   Patient admitted for above and limited by problem list below, including impaired cognition, decreased activity tolerance, impaired balance, generalized weakness. She follows commands but presents with decreased safety awareness, problem solving. Patient requires min assist for UB ADLs, up to max assist for LB ADLs, min assist for transfers.  She complete sit to stand with min assist, but required mod assist to maintain standing balance during LB self care tasks; sitting EOB loosing balance posteriorly and reports this is due to back discomfort. Difficult to determine baseline, as patient/spouse report fluctuating assist levels for ADLs and mobility.  Educated on safety, recommendations and fall prevention.  Recommended use of RW for all mobility/ADLs with hands on assist. Based on performance today, believe she will benefit from further OT services while admitted and after dc at [facility] level to optimize independence, safety with ADLs, mobility.  Will follow.     Follow Up Recommendations  Home health OT;Supervision/Assistance - 24 hour    Equipment Recommendations  3 in 1 bedside commode    Recommendations for Other Services       Precautions / Restrictions Precautions Precautions: Fall Restrictions Weight Bearing Restrictions: No      Mobility Bed Mobility Overal bed mobility: Needs Assistance Bed Mobility: Supine to Sit;Sit to Supine     Supine to sit: Min assist Sit to supine: Supervision   General bed mobility comments: min assist for trunk support to ascend, given increased time and cueing with HOB elevated using rails, returned to  supine with supervision    Transfers Overall transfer level: Needs assistance Equipment used: Rolling walker (2 wheeled);Straight cane Transfers: Sit to/from Stand Sit to Stand: Min assist         General transfer comment: min assist from chair at sink, EOB; cueing for hand placement and safety, assist to power up and steady; posterior lean and fatigue with poor tolerance, requiring up to mod assist to maintain standing during ADL tasks    Balance Overall balance assessment: Needs assistance Sitting-balance support: No upper extremity supported;Feet supported Sitting balance-Leahy Scale: Fair Sitting balance - Comments: posterior lean, requires min-min guard for static sitting   Standing balance support: Bilateral upper extremity supported;During functional activity Standing balance-Leahy Scale: Poor Standing balance comment: Reliant on BUE support                           ADL either performed or assessed with clinical judgement   ADL Overall ADL's : Needs assistance/impaired     Grooming: Minimal assistance;Standing;Wash/dry hands;Oral care   Upper Body Bathing: Sitting;Minimal assistance   Lower Body Bathing: Sit to/from stand;Moderate assistance   Upper Body Dressing : Minimal assistance;Sitting   Lower Body Dressing: Maximal assistance;Sit to/from stand   Toilet Transfer: Minimal assistance;Ambulation           Functional mobility during ADLs: Minimal assistance;Moderate assistance;Rolling walker;Cueing for safety General ADL Comments: pt limited by weakness, decreased activity tolerance, impaired cognition     Vision   Additional Comments: appears [facility]     Perception     Praxis      Pertinent Vitals/Pain Pain Assessment: Faces Faces Pain  Scale: No hurt Pain Location: back Pain Descriptors / Indicators: Grimacing;Guarding Pain Intervention(s): Monitored during session     Hand Dominance Right   Extremity/Trunk Assessment Upper  Extremity Assessment Upper Extremity Assessment: Generalized weakness   Lower Extremity Assessment Lower Extremity Assessment: Defer to PT evaluation RLE Deficits / Details: Reports neuropathy up to knees LLE Deficits / Details: Reports neuropathy throughout LLE.   Cervical / Trunk Assessment Cervical / Trunk Assessment: Kyphotic   Communication Communication Communication: No difficulties   Cognition Arousal/Alertness: Awake/alert Behavior During Therapy: WFL for tasks assessed/performed Overall Cognitive Status: Impaired/Different from baseline Area of Impairment: Following commands;Awareness;Problem solving;Safety/judgement                       Following Commands: Follows one step commands consistently;Follows one step commands with increased time;Follows multi-step commands inconsistently Safety/Judgement: Decreased awareness of safety Awareness: Emergent Problem Solving: Slow processing;Difficulty sequencing;Requires verbal cues General Comments: pt presenting with decreased safety awareness and poor problem sovling, requires verbal cueing to complete basic ADLs and poor awareness   General Comments  pt in restroom upon entry, utilize cane and assumed spouse assisted into restroom.  Requires min A for safety/balance with cane to reach sink, educated on need to use RW and have hands on assist for any mobility at this time.  Pt reports increased urinary urgency and incontinence (without drinking much) and RN notified.    Exercises     Shoulder Instructions      Home Living Family/patient expects to be discharged to:: Private residence Living Arrangements: Spouse/significant other Available Help at Discharge: Family;Available 24 hours/day Type of Home: House Home Access: Ramped entrance;Stairs to enter Entrance Stairs-Number of Steps: 4 Entrance Stairs-Rails: Right;Left Home Layout: One level     Bathroom Shower/Tub: Walk-in shower;Tub/shower unit   Bathroom  Toilet: Handicapped height     Home Equipment: Cane - single point;Walker - 2 wheels;Shower seat          Prior Functioning/Environment Level of Independence: Independent with assistive device(s)        Comments: uses cane for ambulation, not driving; limited iADLs per spouse        OT Problem List: Decreased strength;Decreased activity tolerance;Impaired balance (sitting and/or standing);Decreased cognition;Decreased safety awareness;Decreased knowledge of use of DME or AE;Decreased knowledge of precautions      OT Treatment/Interventions: Self-care/ADL training;DME and/or AE instruction;Therapeutic activities;Cognitive remediation/compensation;Patient/family education;Balance training;Therapeutic exercise;Energy conservation    OT Goals(Current goals can be found in the care plan section) Acute Rehab OT Goals Patient Stated Goal: to feel better and go home OT Goal Formulation: With patient Time For Goal Achievement: 09/02/20 Potential to Achieve Goals: Good  OT Frequency: Min 2X/week   Barriers to D/C:            Co-evaluation              AM-PAC OT "6 Clicks" Daily Activity     Outcome Measure Help from another person eating meals?: A Little Help from another person taking care of personal grooming?: A Little Help from another person toileting, which includes using toliet, bedpan, or urinal?: A Little Help from another person bathing (including washing, rinsing, drying)?: A Lot Help from another person to put on and taking off regular upper body clothing?: A Little Help from another person to put on and taking off regular lower body clothing?: A Lot 6 Click Score: 16   End of Session Equipment Utilized During Treatment: Gait belt;Rolling walker Nurse Communication: Mobility status;Other (comment);Precautions (  urinary frequency)  Activity Tolerance: Patient tolerated treatment well Patient left: in bed;with call bell/phone within reach;with bed alarm set;with  family/visitor present  OT Visit Diagnosis: Other abnormalities of gait and mobility (R26.89);Muscle weakness (generalized) (M62.81);History of falling (Z91.81);Other symptoms and signs involving cognitive function                Time: 1062-6948 OT Time Calculation (min): 29 min Charges:  OT General Charges $OT Visit: 1 Visit OT Evaluation $OT Eval Moderate Complexity: 1 Mod OT Treatments $Self Care/Home Management : 8-22 mins  Barry Brunner, OT Acute Rehabilitation Services Pager [phone] Office [phone]   Chancy Milroy 08/19/2020, 1:06 PM

## 2020-08-19 NOTE — Progress Notes (Signed)
Inpatient Diabetes Program Recommendations  AACE/ADA: New Consensus Statement on Inpatient Glycemic Control (2015)  Target Ranges:  Prepandial:   less than 140 mg/dL      Peak postprandial:   less than 180 mg/dL (1-2 hours)      Critically ill patients:  140 - 180 mg/dL   Lab Results  Component Value Date   GLUCAP 195 (H) 08/19/2020   HGBA1C 9.4 (H) 08/19/2020    Review of Glycemic Control Results for Maria [name], Maria [name] (MRN [id number]) as of 08/19/2020 10:17  Ref. Range 08/19/2020 08:59  Glucose-Capillary Latest Ref Range: 70 - 99 mg/dL 951 (H)   Diabetes history: DM  Outpatient Diabetes medications:  Glyburide 5 mg daily Current orders for Inpatient glycemic control:  Novolog sensitive tid with meals and HS  Inpatient Diabetes Program Recommendations:    A1C is >goal.  May consider adding Lantus 5 units daily while in the hospital.   Thanks,  Beryl Meager, RN, BC-ADM Inpatient Diabetes Coordinator Pager [phone] (8a-5p)

## 2020-08-19 NOTE — Discharge Instructions (Signed)
Care After Your Loop Recorder  . You have a [employer] Loop Recorder   . Monitor your cardiac device site for redness, swelling, and drainage. Call the device clinic at [phone] if you experience these symptoms or fever/chills.  . You may shower 3 days after your loop recorder implant and wash your incision with soap and water. Avoid lotions, ointments, or perfumes over your incision until it is well-healed.  . You may use a hot tub or a pool AFTER your wound check appointment if the incision is completely closed.  . Your device is MRI compatible.   . Remote monitoring is used to monitor your cardiac device from home. This monitoring is scheduled every month days by our office. It allows us to keep an eye on the functioning of your device to ensure it is working properly.   Additional discharge instructions  Please get your medications reviewed and adjusted by your Primary MD.  Please request your Primary MD to go over all Hospital Tests and Procedure/Radiological results at the follow up, please get all Hospital records sent to your Prim MD by signing hospital release before you go home.  If you had Pneumonia of Lung problems at the Hospital: Please get a 2 view Chest X ray done in 6-8 weeks after hospital discharge or sooner if instructed by your Primary MD.  If you have Congestive Heart Failure: Please call your Cardiologist or Primary MD anytime you have any of the following symptoms:  1) 3 pound weight gain in 24 hours or 5 pounds in 1 week  2) shortness of breath, with or without a dry hacking cough  3) swelling in the hands, feet or stomach  4) if you have to sleep on extra pillows at night in order to breathe  Follow cardiac low salt diet and 1.5 lit/day fluid restriction.  If you have diabetes Accuchecks 4 times/day, Once in AM empty stomach and then before each meal. Log in all results and show them to your primary doctor at your next visit. If any glucose reading is  under 80 or above 300 call your primary MD immediately.  If you have Seizure/Convulsions/Epilepsy: Please do not drive, operate heavy machinery, participate in activities at heights or participate in high speed sports until you have seen by Primary MD or a Neurologist and advised to do so again.  If you had Gastrointestinal Bleeding: Please ask your Primary MD to check a complete blood count within one week of discharge or at your next visit. Your endoscopic/colonoscopic biopsies that are pending at the time of discharge, will also need to followed by your Primary MD.  Get Medicines reviewed and adjusted. Please take all your medications with you for your next visit with your Primary MD  Please request your Primary MD to go over all hospital tests and procedure/radiological results at the follow up, please ask your Primary MD to get all Hospital records sent to his/her office.  If you experience worsening of your admission symptoms, develop shortness of breath, life threatening emergency, suicidal or homicidal thoughts you must seek medical attention immediately by calling 911 or calling your MD immediately  if symptoms less severe.  You must read complete instructions/literature along with all the possible adverse reactions/side effects for all the Medicines you take and that have been prescribed to you. Take any new Medicines after you have completely understood and accpet all the possible adverse reactions/side effects.   Do not drive or operate heavy machinery when taking Pain   medications.   Do not take more than prescribed Pain, Sleep and Anxiety Medications  Special Instructions: If you have smoked or chewed Tobacco  in the last 2 yrs please stop smoking, stop any regular Alcohol  and or any Recreational drug use.  Wear Seat belts while driving.  Please note You were cared for by a hospitalist during your hospital stay. If you have any questions about your discharge medications or the  care you received while you were in the hospital after you are discharged, you can call the unit and asked to speak with the hospitalist on call if the hospitalist that took care of you is not available. Once you are discharged, your primary care physician will handle any further medical issues. Please note that NO REFILLS for any discharge medications will be authorized once you are discharged, as it is imperative that you return to your primary care physician (or establish a relationship with a primary care physician if you do not have one) for your aftercare needs so that they can reassess your need for medications and monitor your lab values.  You can reach the hospitalist office at phone [phone] or fax [phone]   If you do not have a primary care physician, you can call [phone] for a physician referral.   

## 2020-08-19 NOTE — ED Notes (Signed)
Attempted to call report x2

## 2020-08-19 NOTE — H&P (Addendum)
History and Physical    Maria [name]:[id number] DOB: [birth year] DOA: 08/18/2020  PCP: Joaquim Nam, MD Patient coming from: Home  Chief Complaint: Confusion, slurred speech  HPI: Maria [name] is a 80 y.o. female with medical history significant of carotid artery disease, CAD status post PCI in 2016, hypertension, hyperlipidemia, type 2 diabetes, PSVT status post catheter ablation in 2016 presented to the ED via EMS for evaluation of confusion, slurred speech, and word finding difficulty which started around 7 PM on 08/18/2020.  Symptoms lasted about 40 minutes to an hour and resolved before EMS arrived.  Blood pressure significantly elevated with systolic above 200 upon arrival to the ED.  Labs showing WBC 9.4, hemoglobin 11.4 (stable), platelet count 372K.  Sodium 139, potassium 3.4, chloride 103, bicarb 26, BUN 12, creatinine 0.9, glucose 337.  INR 1.0.  Screening Covid and influenza PCR negative.  CT head negative for acute or evolving ischemia.  Showing moderate to advanced chronic microvascular ischemic disease and a probable 7 mm hemangioma overlying the right frontal convexity without associated mass-effect.  CTA head and neck showing occlusion of the distal right A2 segment, age-indeterminate.  Attenuated and irregular flow is seen distally within the right ACA branches, likely collateral.  Mild atherosclerotic change about the carotic bifurcations and carotid siphons without hemodynamically significant stenosis.  Diffuse small vessel atheromatous irregularity elsewhere throughout the intracranial circulation. Neurology consulted and recommended admission for TIA work-up.  Patient states she was talking to her daughter on the phone and became confused and was pressing the wrong buttons.  She was also having difficulty with her speech.  She did not have any weakness in her arm or leg.  Did not have any changes in her vision.  She is not sure how long her symptoms lasted.  Denies history of  prior stroke or TIA.  She does not smoke cigarettes.  States she has a history of high blood pressure but is not on any medications as per decision from her primary care physician and cardiologist.  She does not take any blood thinners.  She has no other complaints.  Denies fevers, cough, shortness of breath, chest pain, nausea, vomiting, abdominal pain, or diarrhea.  Review of Systems:  All systems reviewed and apart from history of presenting illness, are negative.  Past Medical History:  Diagnosis Date  . Anemia 05/1985  . Carotid artery disease (HCC)    a. 02/2015 - 1-39% bilaterally.  . Chronic back pain   . Concussion   . Coronary artery disease    a. s/p DES to [facility] 01/2015.  . DDD (degenerative disc disease), cervical   . DDD (degenerative disc disease), lumbar   . Fracture of lower leg 07/2001   Right  . GERD (gastroesophageal reflux disease)   . Hyperlipidemia   . Hypertension   . Multinodular thyroid   . Pneumonia ?1990's X 1  . PSVT (paroxysmal supraventricular tachycardia) (HCC)    a. s/p catheter ablation of a parahisian atrial tachycardia which was successfully ablated from the non-coronary cusp of the aortic root 06/2014.  [name] Renal cyst 07/10/2006   bilateral  . Right knee pain 02/2010   Injected - Dr. Shelle Iron  . Sinus pause    a. By event monitoring 09/2014 - BB stopped at that time.  . Syncope and collapse 07/2001   "that's when I broke my leg"  . Type II diabetes mellitus (HCC) 2002    Past Surgical History:  Procedure Laterality Date  .  ANTERIOR CERVICAL DECOMP/DISCECTOMY FUSION  10/03/2005   C4/5; C5/6  "it's got a plate in there"  . ANTERIOR LAT LUMBAR FUSION Left 02/04/2018   Procedure: Lumbar two-three Lumbar three-four Lumbar four-five  Anterolateral decompression/percutaneous posterior arthrodesis, Mazor;  Surgeon: Barnett Abu, MD;  Location: [facility] OR;  Service: Neurosurgery;  Laterality: Left;  . APPLICATION OF ROBOTIC ASSISTANCE FOR SPINAL PROCEDURE N/A  02/04/2018   Procedure: APPLICATION OF ROBOTIC ASSISTANCE FOR SPINAL PROCEDURE;  Surgeon: Barnett Abu, MD;  Location: [facility] OR;  Service: Neurosurgery;  Laterality: N/A;  . BACK SURGERY    . BREAST BIOPSY Bilateral 1968 (multiple)   "all benign"  . BREAST LUMPECTOMY Bilateral 1968  . CARDIAC CATHETERIZATION N/A 11/18/2014   Procedure: Left Heart Cath and Coronary Angiography;  Surgeon: Peter M Swaziland, MD;  Location: [facility] INVASIVE CV LAB;  Service: Cardiovascular;  Laterality: N/A;  . CARDIAC CATHETERIZATION N/A 01/22/2015   Procedure: Coronary Stent Intervention;  Surgeon: Peter M Swaziland, MD;  Location: [facility] INVASIVE CV LAB;  Service: Cardiovascular;  Laterality: N/A;  . CARDIAC CATHETERIZATION N/A 06/10/2015   Procedure: Left Heart Cath and Coronary Angiography;  Surgeon: Peter M Swaziland, MD;  Location: [facility] INVASIVE CV LAB;  Service: Cardiovascular;  Laterality: N/A;  . CATARACT EXTRACTION W/ INTRAOCULAR LENS  IMPLANT, BILATERAL Bilateral 12/2010  . CORONARY ANGIOPLASTY    . ESOPHAGOGASTRODUODENOSCOPY  08/14/2005   gastropathy biopsy, negative  . LAPAROSCOPIC CHOLECYSTECTOMY  08/1992  . LUMBAR PERCUTANEOUS PEDICLE SCREW 3 LEVEL N/A 02/04/2018   Procedure: LUMBAR PERCUTANEOUS PEDICLE SCREW LUMBAR TWO - LUMBAR FIVE;  Surgeon: Barnett Abu, MD;  Location: [facility] OR;  Service: Neurosurgery;  Laterality: N/A;  . POSTERIOR LAMINECTOMY / DECOMPRESSION LUMBAR SPINE  09/2001   due to herniated disc  . SUPRAVENTRICULAR TACHYCARDIA ABLATION N/A 07/06/2014   Procedure: SUPRAVENTRICULAR TACHYCARDIA ABLATION;  Surgeon: Marinus Maw, MD;  Location: [facility] CATH LAB;  Service: Cardiovascular;  Laterality: N/A;  . VAGINAL HYSTERECTOMY  1968   spotting     reports that she has never smoked. She has never used smokeless tobacco. She reports that she does not drink alcohol and does not use drugs.  Allergies  Allergen Reactions  . Crestor [Rosuvastatin] Other (See Comments)    MYALGIAS  . Ezetimibe-Simvastatin Other (See  Comments)    MYALGIAS  . Pravastatin Other (See Comments)    Myalgias  . Diazepam     Balance difficulty.    . Ezetimibe   . Lyrica [Pregabalin]     swelling  . Nsaids     UNSPECIFIED REACTION   . Praluent [Alirocumab]     myalgias  . Repatha [Evolocumab]     Hoarse voice  . Acetaminophen Anxiety and Other (See Comments)    REACTION: jittery with plain tylenol, but tolerated tylenol/benadryl combination  . Carvedilol Other (See Comments)    nightmares  . Gabapentin Diarrhea  . Ibuprofen Other (See Comments)    REACTION: GI upset at high doses  . Insulin Glargine Rash    Rash  . Levemir [Insulin Detemir] Rash  . Macrobid [Nitrofurantoin Macrocrystal] Nausea And Vomiting and Other (See Comments)    ACHES  . Metformin Nausea And Vomiting    REACTION: GI upset  . Metoprolol Tartrate Other (See Comments)    REACTION: nightmares  . Naproxen Nausea And Vomiting    REACTION: GI upset    Family History  Problem Relation Age of Onset  . Hypertension Mother   . Heart disease Mother   . Diabetes Sister   .  Myasthenia gravis Sister   . Diabetes Brother        1/2 juvenile; foot ulcer; periph. neuropathy  . Heart disease Maternal Grandfather        MI  . Hypertension Maternal Grandfather   . Depression Paternal Aunt   . Hypertension Maternal Grandmother   . Diabetes Maternal Grandmother   . Stroke Maternal Grandmother   . Hypertension Paternal Grandmother   . Diabetes Paternal Grandmother   . Hypertension Paternal Grandfather   . Diabetes Paternal Grandfather   . Breast cancer Maternal Aunt   . Colon cancer Neg Hx     Prior to Admission medications   Medication Sig Start Date End Date Taking? Authorizing Provider  Bempedoic Acid-Ezetimibe (NEXLIZET) 180-10 MG TABS Take 1 tablet by mouth daily. 08/16/20  Yes Wendall StadeNishan, [name], MD  cyanocobalamin (,VITAMIN B-12,) 1000 MCG/ML injection 1000 mcg injected IM every 14 days. 10/08/18  Yes Joaquim Namuncan, Graham S, MD  glyBURIDE (DIABETA)  5 MG tablet Take 1 tablet (5 mg total) by mouth daily. 04/23/20  Yes Joaquim Namuncan, Graham S, MD  loperamide (IMODIUM) 2 MG capsule Take 2 mg by mouth 4 (four) times daily as needed for diarrhea or loose stools.    Yes [provider]  nitroGLYCERIN (NITROSTAT) 0.4 MG SL tablet PLACE 1 TABLET UNDER THE TONGUE EVERY 5 MINUTES AS NEEDED FOR CHEST PAIN FOR UP TO 3 DOSES 09/30/19  Yes Wendall StadeNishan, [name], MD  omeprazole (PRILOSEC) 20 MG capsule Take 1 capsule (20 mg total) by mouth 2 (two) times daily as needed (for acid reflux/indigestion.). 04/23/18  Yes Joaquim Namuncan, Graham S, MD  [facility] test strip USE AS INSTRUCTED TO TEST BLOOD SUGAR THREE TIMES DAILY. 08/09/20  Yes Joaquim Namuncan, Graham S, MD  oxyCODONE (OXY IR/ROXICODONE) 5 MG immediate release tablet Take 5 mg by mouth every 6 (six) hours as needed. 08/13/20  Yes [provider]  traMADol (ULTRAM) 50 MG tablet TAKE 1 TABLET BY MOUTH EVERY 6 HOURS AS NEEDED 06/06/20  Yes Joaquim Namuncan, Graham S, MD    Physical Exam: Vitals:   08/18/20 2200 08/18/20 2215 08/18/20 2300 08/19/20 0030  BP: (!) 168/55 (!) 160/72 130/72 132/61  Pulse: 90 90 94 87  Resp: 15 13 17 13   Temp:      TempSrc:      SpO2: 97% 97% 98% 99%  Weight:      Height:        Physical Exam Constitutional:      General: She is not in acute distress. HENT:     Head: Normocephalic and atraumatic.  Eyes:     Extraocular Movements: Extraocular movements intact.     Conjunctiva/sclera: Conjunctivae normal.  Cardiovascular:     Rate and Rhythm: Normal rate and regular rhythm.     Pulses: Normal pulses.  Pulmonary:     Effort: Pulmonary effort is normal. No respiratory distress.     Breath sounds: Normal breath sounds. No wheezing or rales.  Abdominal:     General: Bowel sounds are normal. There is no distension.     Palpations: Abdomen is soft.     Tenderness: There is no abdominal tenderness.  Musculoskeletal:        General: No swelling or tenderness.     Cervical back: Normal  range of motion and neck supple.  Skin:    General: Skin is warm and dry.  Neurological:     General: No focal deficit present.     Mental Status: She is alert and  oriented to person, place, and time.     Cranial Nerves: No cranial nerve deficit.     Sensory: No sensory deficit.     Motor: No weakness.     Comments: Speech fluent No facial droop     Labs on Admission: I have personally reviewed following labs and imaging studies  CBC: Recent Labs  Lab 08/18/20 2117  WBC 9.4  NEUTROABS 5.9  HGB 11.4*  HCT 33.5*  MCV 91.8  PLT 372   Basic Metabolic Panel: Recent Labs  Lab 08/18/20 2117  NA 139  K 3.4*  CL 103  CO2 26  GLUCOSE 337*  BUN 12  CREATININE 0.93  CALCIUM 8.8*   GFR: Estimated Creatinine Clearance: 42.7 mL/min (by C-G formula based on SCr of 0.93 mg/dL). Liver Function Tests: No results for input(s): AST, ALT, ALKPHOS, BILITOT, PROT, ALBUMIN in the last 168 hours. No results for input(s): LIPASE, AMYLASE in the last 168 hours. No results for input(s): AMMONIA in the last 168 hours. Coagulation Profile: Recent Labs  Lab 08/18/20 2117  INR 1.0   Cardiac Enzymes: No results for input(s): CKTOTAL, CKMB, CKMBINDEX, TROPONINI in the last 168 hours. BNP (last 3 results) No results for input(s): PROBNP in the last 8760 hours. HbA1C: No results for input(s): HGBA1C in the last 72 hours. CBG: No results for input(s): GLUCAP in the last 168 hours. Lipid Profile: No results for input(s): CHOL, HDL, LDLCALC, TRIG, CHOLHDL, LDLDIRECT in the last 72 hours. Thyroid Function Tests: No results for input(s): TSH, T4TOTAL, FREET4, T3FREE, THYROIDAB in the last 72 hours. Anemia Panel: No results for input(s): VITAMINB12, FOLATE, FERRITIN, TIBC, IRON, RETICCTPCT in the last 72 hours. Urine analysis:    Component Value Date/Time   COLORURINE YELLOW 04/26/2020 1306   APPEARANCEUR Cloudy (A) 04/26/2020 1306   LABSPEC 1.025 04/26/2020 1306   PHURINE 6.0 04/26/2020  1306   GLUCOSEU NEGATIVE 04/26/2020 1306   HGBUR NEGATIVE 04/26/2020 1306   BILIRUBINUR NEGATIVE 04/26/2020 1306   BILIRUBINUR negative 01/15/2020 1616   KETONESUR NEGATIVE 04/26/2020 1306   PROTEINUR Positive (A) 01/15/2020 1616   PROTEINUR NEGATIVE 10/03/2018 1158   UROBILINOGEN 1.0 04/26/2020 1306   NITRITE POSITIVE (A) 04/26/2020 1306   LEUKOCYTESUR MODERATE (A) 04/26/2020 1306    Radiological Exams on Admission: CT Angio Head W/Cm &/Or Wo Cm  Result Date: 08/19/2020 CLINICAL DATA:  Initial evaluation for acute TIA. EXAM: CT ANGIOGRAPHY HEAD AND NECK TECHNIQUE: Multidetector CT imaging of the head and neck was performed using the standard protocol during bolus administration of intravenous contrast. Multiplanar CT image reconstructions and MIPs were obtained to evaluate the vascular anatomy. Carotid stenosis measurements (when applicable) are obtained utilizing NASCET criteria, using the distal internal carotid diameter as the denominator. CONTRAST:  48mL OMNIPAQUE IOHEXOL 350 MG/ML SOLN COMPARISON:  Prior head CT from 09/21/2019 FINDINGS: CT HEAD FINDINGS Brain: Age-related cerebral atrophy with moderate to advanced chronic microvascular ischemic disease. Remote lacunar infarcts at the left basal ganglia and right frontal centrum semi ovale. No acute intracranial hemorrhage. No acute large vessel territory infarct. Suspected 7 mm meningioma overlying the right frontal convexity without significant mass effect (series 11, image 67). No other mass lesion, midline shift or mass effect. No hydrocephalus or extra-axial fluid collection. Vascular: No hyperdense vessel. Scattered vascular calcifications noted within the carotid siphons. Skull: Scalp soft tissues and calvarium within normal limits. Sinuses: Paranasal sinuses mastoid air cells are clear. Orbits: Globes and orbital soft tissues demonstrate no acute finding. Review of the  MIP images confirms the above findings CTA NECK FINDINGS Aortic arch:  Visualized aortic arch normal caliber with normal 3 vessel morphology. Mild atheromatous change about the origin of the great vessels without hemodynamically significant stenosis. Right carotid system: Right CCA patent from its origin to the bifurcation without stenosis. Mild calcified plaque about the right carotid bulb/proximal right ICA without significant stenosis. Right ICA widely patent distally without stenosis, dissection or occlusion. Left carotid system: Left CCA patent from its origin to the bifurcation without stenosis. Mild eccentric mixed plaque about the left carotid bulb without significant stenosis. Left ICA patent distally without stenosis, dissection or occlusion. Vertebral arteries: Both vertebral arteries arise from subclavian arteries. Vertebral arteries widely patent without stenosis, dissection or occlusion. Skeleton: No visible acute osseous finding. No discrete or worrisome osseous lesions. Prior fusion at C4-C6. Grade 1 anterolisthesis of C7 on T1. Other neck: No other acute soft tissue abnormality within the neck. No mass or adenopathy. 1.3 cm left thyroid nodule, felt to be of doubtful significance given size and patient age, no follow-up imaging recommended (ref: J Am Coll Radiol. 2015 Feb;12(2): 143-50). Upper chest: Visualized upper chest demonstrates no acute finding. Review of the MIP images confirms the above findings CTA HEAD FINDINGS Anterior circulation: Petrous segments patent bilaterally. Scattered atheromatous change within the carotid siphons with associated mild multifocal narrowing. A1 segments patent bilaterally. Normal anterior communicating artery complex. Left ACA widely patent to its distal aspect. There is age indeterminate occlusion of the distal right A2 segment (series 11, image 86). Attenuated irregular flow seen distally within right ACA branches, likely collateral in nature. M1 segments patent without stenosis. Normal MCA bifurcations. No proximal MCA branch  occlusion. Diffuse small vessel atheromatous irregularity throughout the MCA branches bilaterally. Posterior circulation: Both V4 segments patent to the vertebrobasilar junction without stenosis. Right vertebral slightly dominant. Both PICA origins patent and normal. Basilar patent to its distal aspect without stenosis. Superior cerebellar arteries patent bilaterally. Both PCAs primarily supplied via the basilar. Atheromatous irregularity within both PCAs without high-grade stenosis. Venous sinuses: Grossly patent allowing for timing the contrast bolus. Anatomic variants: None significant.  No aneurysm. Review of the MIP images confirms the above findings IMPRESSION: CT HEAD IMPRESSION: 1. No acute intracranial abnormality identified. Specifically, no evidence for acute or evolving ischemia. 2. Moderate to advanced chronic microvascular ischemic disease. 3. Probable 7 mm hemangioma overlying the right frontal convexity without associated mass effect. CTA HEAD AND NECK IMPRESSION: 1. 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 Attenuated and irregular flow is seen distally within right ACA branches, likely collateral. 2. Mild atherosclerotic change about the carotid bifurcations and carotid siphons without hemodynamically significant stenosis. 3. Diffuse small vessel atheromatous irregularity elsewhere throughout the intracranial circulation Electronically Signed   By: Rise Mu M.D.   On: 08/19/2020 00:33   CT Angio Neck W and/or Wo Contrast  Result Date: 08/19/2020 CLINICAL DATA:  Initial evaluation for acute TIA. EXAM: CT ANGIOGRAPHY HEAD AND NECK TECHNIQUE: Multidetector CT imaging of the head and neck was performed using the standard protocol during bolus administration of intravenous contrast. Multiplanar CT image reconstructions and MIPs were obtained to evaluate the vascular anatomy. Carotid stenosis measurements (when applicable) are obtained utilizing NASCET criteria, using  the distal internal carotid diameter as the denominator. CONTRAST:  96mL OMNIPAQUE IOHEXOL 350 MG/ML SOLN COMPARISON:  Prior head CT from 09/21/2019 FINDINGS: CT HEAD FINDINGS Brain: Age-related cerebral atrophy with moderate to advanced chronic microvascular ischemic disease. Remote lacunar infarcts at  the left basal ganglia and right frontal centrum semi ovale. No acute intracranial hemorrhage. No acute large vessel territory infarct. Suspected 7 mm meningioma overlying the right frontal convexity without significant mass effect (series 11, image 67). No other mass lesion, midline shift or mass effect. No hydrocephalus or extra-axial fluid collection. Vascular: No hyperdense vessel. Scattered vascular calcifications noted within the carotid siphons. Skull: Scalp soft tissues and calvarium within normal limits. Sinuses: Paranasal sinuses mastoid air cells are clear. Orbits: Globes and orbital soft tissues demonstrate no acute finding. Review of the MIP images confirms the above findings CTA NECK FINDINGS Aortic arch: Visualized aortic arch normal caliber with normal 3 vessel morphology. Mild atheromatous change about the origin of the great vessels without hemodynamically significant stenosis. Right carotid system: Right CCA patent from its origin to the bifurcation without stenosis. Mild calcified plaque about the right carotid bulb/proximal right ICA without significant stenosis. Right ICA widely patent distally without stenosis, dissection or occlusion. Left carotid system: Left CCA patent from its origin to the bifurcation without stenosis. Mild eccentric mixed plaque about the left carotid bulb without significant stenosis. Left ICA patent distally without stenosis, dissection or occlusion. Vertebral arteries: Both vertebral arteries arise from subclavian arteries. Vertebral arteries widely patent without stenosis, dissection or occlusion. Skeleton: No visible acute osseous finding. No discrete or worrisome  osseous lesions. Prior fusion at C4-C6. Grade 1 anterolisthesis of C7 on T1. Other neck: No other acute soft tissue abnormality within the neck. No mass or adenopathy. 1.3 cm left thyroid nodule, felt to be of doubtful significance given size and patient age, no follow-up imaging recommended (ref: J Am Coll Radiol. 2015 Feb;12(2): 143-50). Upper chest: Visualized upper chest demonstrates no acute finding. Review of the MIP images confirms the above findings CTA HEAD FINDINGS Anterior circulation: Petrous segments patent bilaterally. Scattered atheromatous change within the carotid siphons with associated mild multifocal narrowing. A1 segments patent bilaterally. Normal anterior communicating artery complex. Left ACA widely patent to its distal aspect. There is age indeterminate occlusion of the distal right A2 segment (series 11, image 86). Attenuated irregular flow seen distally within right ACA branches, likely collateral in nature. M1 segments patent without stenosis. Normal MCA bifurcations. No proximal MCA branch occlusion. Diffuse small vessel atheromatous irregularity throughout the MCA branches bilaterally. Posterior circulation: Both V4 segments patent to the vertebrobasilar junction without stenosis. Right vertebral slightly dominant. Both PICA origins patent and normal. Basilar patent to its distal aspect without stenosis. Superior cerebellar arteries patent bilaterally. Both PCAs primarily supplied via the basilar. Atheromatous irregularity within both PCAs without high-grade stenosis. Venous sinuses: Grossly patent allowing for timing the contrast bolus. Anatomic variants: None significant.  No aneurysm. Review of the MIP images confirms the above findings IMPRESSION: CT HEAD IMPRESSION: 1. No acute intracranial abnormality identified. Specifically, no evidence for acute or evolving ischemia. 2. Moderate to advanced chronic microvascular ischemic disease. 3. Probable 7 mm hemangioma overlying the right  frontal convexity without associated mass effect. CTA HEAD AND NECK IMPRESSION: 1. 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 Attenuated and irregular flow is seen distally within right ACA branches, likely collateral. 2. Mild atherosclerotic change about the carotid bifurcations and carotid siphons without hemodynamically significant stenosis. 3. Diffuse small vessel atheromatous irregularity elsewhere throughout the intracranial circulation Electronically Signed   By: Rise Mu M.D.   On: 08/19/2020 00:33    EKG: Independently reviewed.  Sinus rhythm, RBBB, LAFB.  No acute changes.  Assessment/Plan Principal Problem:   TIA (  transient ischemic attack) Active Problems:   Hyperlipidemia   Essential hypertension   Hypokalemia   Hyperglycemia   TIA Patient presented to the ED for evaluation of confusion, slurred speech, and word finding difficulty which started around 7 PM yesterday (08/18/2020) and symptoms resolved in about 40 minutes to an hour prior to EMS arrival.  Neuro exam currently nonfocal. CT head negative for acute or evolving ischemia.  Showing moderate to advanced chronic microvascular ischemic disease and a probable 7 mm hemangioma overlying the right frontal convexity without associated mass-effect.  CTA head and neck showing occlusion of the distal right A2 segment, age-indeterminate.  Attenuated and irregular flow is seen distally within the right ACA branches, likely collateral.  Mild atherosclerotic change about the carotic bifurcations and carotid siphons without hemodynamically significant stenosis.  Diffuse small vessel atheromatous irregularity elsewhere throughout the intracranial circulation. Neurology consulted by ED provider and recommended admission for TIA work-up. -Telemetry monitoring -Allow for permissive hypertension up to 220/120 -MRI of the brain without contrast -TTE -Hemoglobin A1c, fasting lipid panel -Antiplatelet therapy plans per  neurology. -Frequent neurochecks -PT, OT, speech therapy. -N.p.o. until cleared by bedside swallow evaluation or formal speech evaluation  Mild hypokalemia -Monitor potassium and magnesium levels, replete as needed.  Hypertension Blood pressure was significantly elevated on arrival to the ED with systolic recorded as above 200.  Now improved without any treatment and systolic in the 130s.  Patient is not on any antihypertensives at home. -Allow permissive hypertension at this time  Hyperlipidemia -Lipid panel ordered.  Continue home Nexlizet.  Non-insulin-dependent type 2 diabetes with hyperglycemia Blood glucose in the 300s but labs not suggestive of DKA. -Check A1c.  Sliding scale insulin sensitive ACHS.   DVT prophylaxis: SCDs Code Status: Patient wishes to be full code. Family Communication: Husband at bedside. Disposition Plan: Status is: Observation  The patient remains OBS appropriate and will d/c before 2 midnights.  Dispo: The patient is from: Home              Anticipated d/c is to: Home              Patient currently is not medically stable to d/c.   Difficult to place patient No  Level of care: Level of care: Telemetry Medical   The medical decision making on this patient was of high complexity and the patient is at high risk for clinical deterioration, therefore this is a level 3 visit.  John Giovanni MD [facility]  If 7PM-7AM, please contact night-coverage [email or web address]  08/19/2020, 4:15 AM

## 2020-08-19 NOTE — ED Provider Notes (Signed)
Assumed care of patient from [place] Robinson PA-C at change of shift pending CTA & consult for admission.   Please see prior provider note for full H&P Briefly patient is an 80 yo female w/ a hx of carotid artery disease, CAD, hypertension, hyperlipidemia, & T2DM who presented to the emergency department with an episode of confusion and difficulty speaking that occurred at 7 PM and resolved within 40 minutes to 1 hour.  Has not reoccurred.  Prior provider spoke with neurology service, in agreement with plan for admission for TIA work-up.  CT head & CT angio of head and neck: 1. No acute intracranial abnormality identified. Specifically, no evidence for acute or evolving ischemia. 2. Moderate to advanced chronic microvascular ischemic disease. 3. Probable 7 mm hemangioma overlying the right frontal convexity without associated mass effect. CTA HEAD AND NECK IMPRESSION: 1. 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 Attenuated and irregular flow is seen distally within right ACA branches, likely collateral. 2. Mild atherosclerotic change about the carotid bifurcations and carotid siphons without hemodynamically significant stenosis. 3. Diffuse small vessel atheromatous irregularity elsewhere throughout the intracranial circulation  Blood pressure 132/61, pulse 87, temperature 98.6 F (37 C), temperature source Oral, resp. rate 13, height 5\' 5"  (1.651 m), weight 68 kg, SpO2 99 %.  68: CONSULT: Rediscussed with neurologist Dr. 100, same plan.   00:50: CONSULT: Discussed with hospitalist Dr. Iver Nestle- accepts admission.    Results for orders placed or performed during the hospital encounter of 08/18/20  Resp Panel by RT-PCR (Flu A&B, Covid) Nasopharyngeal Swab   Specimen: Nasopharyngeal Swab; Nasopharyngeal(NP) swabs in vial transport medium  Result Value Ref Range   SARS Coronavirus 2 by RT PCR NEGATIVE NEGATIVE   Influenza A by PCR NEGATIVE NEGATIVE   Influenza B by PCR  NEGATIVE NEGATIVE  Basic metabolic panel  Result Value Ref Range   Sodium 139 135 - 145 mmol/L   Potassium 3.4 (L) 3.5 - 5.1 mmol/L   Chloride 103 98 - 111 mmol/L   CO2 26 22 - 32 mmol/L   Glucose, Bld 337 (H) 70 - 99 mg/dL   BUN 12 8 - 23 mg/dL   Creatinine, Ser 10/18/20 0.44 - 1.00 mg/dL   Calcium 8.8 (L) 8.9 - 10.3 mg/dL   GFR, Estimated 5.03 >88 mL/min   Anion gap 10 5 - 15  CBC with Differential  Result Value Ref Range   WBC 9.4 4.0 - 10.5 K/uL   RBC 3.65 (L) 3.87 - 5.11 MIL/uL   Hemoglobin 11.4 (L) 12.0 - 15.0 g/dL   HCT >82 (L) 80.0 - 34.9 %   MCV 91.8 80.0 - 100.0 fL   MCH 31.2 26.0 - 34.0 pg   MCHC 34.0 30.0 - 36.0 g/dL   RDW 17.9 15.0 - 56.9 %   Platelets 372 150 - 400 K/uL   nRBC 0.0 0.0 - 0.2 %   Neutrophils Relative % 63 %   Neutro Abs 5.9 1.7 - 7.7 K/uL   Lymphocytes Relative 28 %   Lymphs Abs 2.6 0.7 - 4.0 K/uL   Monocytes Relative 7 %   Monocytes Absolute 0.7 0.1 - 1.0 K/uL   Eosinophils Relative 2 %   Eosinophils Absolute 0.2 0.0 - 0.5 K/uL   Basophils Relative 0 %   Basophils Absolute 0.0 0.0 - 0.1 K/uL   Immature Granulocytes 0 %   Abs Immature Granulocytes 0.02 0.00 - 0.07 K/uL  Protime-INR  Result Value Ref Range   Prothrombin Time 13.0  11.4 - 15.2 seconds   INR 1.0 0.8 - 1.2   CT Angio Head W/Cm &/Or Wo Cm  Result Date: 08/19/2020 CLINICAL DATA:  Initial evaluation for acute TIA. EXAM: CT ANGIOGRAPHY HEAD AND NECK TECHNIQUE: Multidetector CT imaging of the head and neck was performed using the standard protocol during bolus administration of intravenous contrast. Multiplanar CT image reconstructions and MIPs were obtained to evaluate the vascular anatomy. Carotid stenosis measurements (when applicable) are obtained utilizing NASCET criteria, using the distal internal carotid diameter as the denominator. CONTRAST:  75mL OMNIPAQUE IOHEXOL 350 MG/ML SOLN COMPARISON:  Prior head CT from 09/21/2019 FINDINGS: CT HEAD FINDINGS Brain: Age-related cerebral atrophy  with moderate to advanced chronic microvascular ischemic disease. Remote lacunar infarcts at the left basal ganglia and right frontal centrum semi ovale. No acute intracranial hemorrhage. No acute large vessel territory infarct. Suspected 7 mm meningioma overlying the right frontal convexity without significant mass effect (series 11, image 67). No other mass lesion, midline shift or mass effect. No hydrocephalus or extra-axial fluid collection. Vascular: No hyperdense vessel. Scattered vascular calcifications noted within the carotid siphons. Skull: Scalp soft tissues and calvarium within normal limits. Sinuses: Paranasal sinuses mastoid air cells are clear. Orbits: Globes and orbital soft tissues demonstrate no acute finding. Review of the MIP images confirms the above findings CTA NECK FINDINGS Aortic arch: Visualized aortic arch normal caliber with normal 3 vessel morphology. Mild atheromatous change about the origin of the great vessels without hemodynamically significant stenosis. Right carotid system: Right CCA patent from its origin to the bifurcation without stenosis. Mild calcified plaque about the right carotid bulb/proximal right ICA without significant stenosis. Right ICA widely patent distally without stenosis, dissection or occlusion. Left carotid system: Left CCA patent from its origin to the bifurcation without stenosis. Mild eccentric mixed plaque about the left carotid bulb without significant stenosis. Left ICA patent distally without stenosis, dissection or occlusion. Vertebral arteries: Both vertebral arteries arise from subclavian arteries. Vertebral arteries widely patent without stenosis, dissection or occlusion. Skeleton: No visible acute osseous finding. No discrete or worrisome osseous lesions. Prior fusion at C4-C6. Grade 1 anterolisthesis of C7 on T1. Other neck: No other acute soft tissue abnormality within the neck. No mass or adenopathy. 1.3 cm left thyroid nodule, felt to be of  doubtful significance given size and patient age, no follow-up imaging recommended (ref: J Am Coll Radiol. 2015 Feb;12(2): 143-50). Upper chest: Visualized upper chest demonstrates no acute finding. Review of the MIP images confirms the above findings CTA HEAD FINDINGS Anterior circulation: Petrous segments patent bilaterally. Scattered atheromatous change within the carotid siphons with associated mild multifocal narrowing. A1 segments patent bilaterally. Normal anterior communicating artery complex. Left ACA widely patent to its distal aspect. There is age indeterminate occlusion of the distal right A2 segment (series 11, image 86). Attenuated irregular flow seen distally within right ACA branches, likely collateral in nature. M1 segments patent without stenosis. Normal MCA bifurcations. No proximal MCA branch occlusion. Diffuse small vessel atheromatous irregularity throughout the MCA branches bilaterally. Posterior circulation: Both V4 segments patent to the vertebrobasilar junction without stenosis. Right vertebral slightly dominant. Both PICA origins patent and normal. Basilar patent to its distal aspect without stenosis. Superior cerebellar arteries patent bilaterally. Both PCAs primarily supplied via the basilar. Atheromatous irregularity within both PCAs without high-grade stenosis. Venous sinuses: Grossly patent allowing for timing the contrast bolus. Anatomic variants: None significant.  No aneurysm. Review of the MIP images confirms the above findings IMPRESSION: CT HEAD IMPRESSION:  1. No acute intracranial abnormality identified. Specifically, no evidence for acute or evolving ischemia. 2. Moderate to advanced chronic microvascular ischemic disease. 3. Probable 7 mm hemangioma overlying the right frontal convexity without associated mass effect. CTA HEAD AND NECK IMPRESSION: 1. 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 Attenuated and irregular flow is seen distally within right ACA  branches, likely collateral. 2. Mild atherosclerotic change about the carotid bifurcations and carotid siphons without hemodynamically significant stenosis. 3. Diffuse small vessel atheromatous irregularity elsewhere throughout the intracranial circulation Electronically Signed   By: Rise Mu M.D.   On: 08/19/2020 00:33   CT Angio Neck W and/or Wo Contrast  Result Date: 08/19/2020 CLINICAL DATA:  Initial evaluation for acute TIA. EXAM: CT ANGIOGRAPHY HEAD AND NECK TECHNIQUE: Multidetector CT imaging of the head and neck was performed using the standard protocol during bolus administration of intravenous contrast. Multiplanar CT image reconstructions and MIPs were obtained to evaluate the vascular anatomy. Carotid stenosis measurements (when applicable) are obtained utilizing NASCET criteria, using the distal internal carotid diameter as the denominator. CONTRAST:  65mL OMNIPAQUE IOHEXOL 350 MG/ML SOLN COMPARISON:  Prior head CT from 09/21/2019 FINDINGS: CT HEAD FINDINGS Brain: Age-related cerebral atrophy with moderate to advanced chronic microvascular ischemic disease. Remote lacunar infarcts at the left basal ganglia and right frontal centrum semi ovale. No acute intracranial hemorrhage. No acute large vessel territory infarct. Suspected 7 mm meningioma overlying the right frontal convexity without significant mass effect (series 11, image 67). No other mass lesion, midline shift or mass effect. No hydrocephalus or extra-axial fluid collection. Vascular: No hyperdense vessel. Scattered vascular calcifications noted within the carotid siphons. Skull: Scalp soft tissues and calvarium within normal limits. Sinuses: Paranasal sinuses mastoid air cells are clear. Orbits: Globes and orbital soft tissues demonstrate no acute finding. Review of the MIP images confirms the above findings CTA NECK FINDINGS Aortic arch: Visualized aortic arch normal caliber with normal 3 vessel morphology. Mild atheromatous  change about the origin of the great vessels without hemodynamically significant stenosis. Right carotid system: Right CCA patent from its origin to the bifurcation without stenosis. Mild calcified plaque about the right carotid bulb/proximal right ICA without significant stenosis. Right ICA widely patent distally without stenosis, dissection or occlusion. Left carotid system: Left CCA patent from its origin to the bifurcation without stenosis. Mild eccentric mixed plaque about the left carotid bulb without significant stenosis. Left ICA patent distally without stenosis, dissection or occlusion. Vertebral arteries: Both vertebral arteries arise from subclavian arteries. Vertebral arteries widely patent without stenosis, dissection or occlusion. Skeleton: No visible acute osseous finding. No discrete or worrisome osseous lesions. Prior fusion at C4-C6. Grade 1 anterolisthesis of C7 on T1. Other neck: No other acute soft tissue abnormality within the neck. No mass or adenopathy. 1.3 cm left thyroid nodule, felt to be of doubtful significance given size and patient age, no follow-up imaging recommended (ref: J Am Coll Radiol. 2015 Feb;12(2): 143-50). Upper chest: Visualized upper chest demonstrates no acute finding. Review of the MIP images confirms the above findings CTA HEAD FINDINGS Anterior circulation: Petrous segments patent bilaterally. Scattered atheromatous change within the carotid siphons with associated mild multifocal narrowing. A1 segments patent bilaterally. Normal anterior communicating artery complex. Left ACA widely patent to its distal aspect. There is age indeterminate occlusion of the distal right A2 segment (series 11, image 86). Attenuated irregular flow seen distally within right ACA branches, likely collateral in nature. M1 segments patent without stenosis. Normal MCA bifurcations. No proximal  MCA branch occlusion. Diffuse small vessel atheromatous irregularity throughout the MCA branches  bilaterally. Posterior circulation: Both V4 segments patent to the vertebrobasilar junction without stenosis. Right vertebral slightly dominant. Both PICA origins patent and normal. Basilar patent to its distal aspect without stenosis. Superior cerebellar arteries patent bilaterally. Both PCAs primarily supplied via the basilar. Atheromatous irregularity within both PCAs without high-grade stenosis. Venous sinuses: Grossly patent allowing for timing the contrast bolus. Anatomic variants: None significant.  No aneurysm. Review of the MIP images confirms the above findings IMPRESSION: CT HEAD IMPRESSION: 1. No acute intracranial abnormality identified. Specifically, no evidence for acute or evolving ischemia. 2. Moderate to advanced chronic microvascular ischemic disease. 3. Probable 7 mm hemangioma overlying the right frontal convexity without associated mass effect. CTA HEAD AND NECK IMPRESSION: 1. 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 Attenuated and irregular flow is seen distally within right ACA branches, likely collateral. 2. Mild atherosclerotic change about the carotid bifurcations and carotid siphons without hemodynamically significant stenosis. 3. Diffuse small vessel atheromatous irregularity elsewhere throughout the intracranial circulation Electronically Signed   By: Rise Mu M.D.   On: 08/19/2020 00:33   MR Lumbar Spine W Wo Contrast  Result Date: 08/12/2020 CLINICAL DATA:  Low back pain.  Prior back surgery 2019. EXAM: MRI LUMBAR SPINE WITHOUT AND WITH CONTRAST TECHNIQUE: Multiplanar and multiecho pulse sequences of the lumbar spine were obtained without and with intravenous contrast. CONTRAST:  37mL MULTIHANCE GADOBENATE DIMEGLUMINE 529 MG/ML IV SOLN COMPARISON:  MRI lumbar spine 07/16/2018 FINDINGS: Segmentation:  Normal Alignment: 5 mm retrolisthesis T12-L1 has progressed in the interval. 5 mm retrolisthesis L1-2 unchanged.  Remaining alignment anatomic.  Vertebrae:  Negative for fracture or mass. Hardware: Pedicle screw and interbody fusion L2 through L5 unchanged from the prior study. Conus medullaris and cauda equina: Conus extends to the L1 level. Conus and cauda equina appear normal. Paraspinal and other soft tissues: Negative for paraspinous mass, adenopathy, fluid collection. Large 7 cm right renal cyst unchanged. Disc levels: T12-L1: Progressive disc degeneration with disc space narrowing and spurring asymmetric to the left. Progressive retrolisthesis. Bilateral facet hypertrophy. Moderate subarticular and foraminal stenosis on the left has progressed in the interval. Mild spinal stenosis L1-2: 5 mm retrolisthesis with disc degeneration and spurring and bilateral facet degeneration. Mild left subarticular stenosis unchanged. L2-3: Pedicle screw and interbody fusion.  Negative for stenosis L3-4: Pedicle screw and interbody fusion.  Negative for stenosis L4-5: Pedicle screw and interbody fusion.  Negative for stenosis L5-S1: Normal disc space. Moderate facet degeneration. No significant foraminal stenosis. IMPRESSION: Progressive disc degeneration at T12-L1 with retrolisthesis and spurring. Moderate subarticular and foraminal stenosis on the left and mild spinal stenosis 5 mm retrolisthesis L1-2 unchanged with mild left subarticular stenosis. PLIF L2 through L5 without stenosis. Electronically Signed   By: Marlan Palau M.D.   On: 08/12/2020 10:39      Cherly Anderson, PA-C 08/19/20 0157    Tegeler, Canary Brim, MD 08/19/20 [phone]

## 2020-08-19 NOTE — Consult Note (Addendum)
ELECTROPHYSIOLOGY CONSULT NOTE  Patient ID: Maria [name] MRN: [id number], DOB/AGE: 1941/02/04   Admit date: 08/18/2020 Date of Consult: 08/19/2020  Primary Physician: Joaquim Nam, MD Primary Cardiologist: Charlton Haws, MD  Primary Electrophysiologist: Re-establish with Dr. Ladona Ridgel  Reason for Consultation: Cryptogenic stroke; recommendations regarding Implantable Loop Recorder Insurance: [facility] Medicare  History of Present Illness EP has been asked to evaluate Maria [name] for placement of an implantable loop recorder to monitor for atrial fibrillation by Dr Roda Shutters.  The patient was admitted on 08/18/2020 with AMS and aphasia.    Imaging demonstrated Stroke : Right corona radiata infarct likely secondary to small vessel occlusion vs cardioembolic source.  She has undergone workup for stroke including:   CTA head & neck : per above  MRI  : Impression         Small acute on chronic white matter infarct at the right corona radiata, right superior frontal gyrus.  2D Echo performed but not read(ongoing currently)  LDL 127  HgbA1c 9.4  The patient has been monitored on telemetry which has demonstrated sinus rhythm with no arrhythmias.  Inpatient stroke work-up will not require a TEE per Neurology.   Echocardiogram as above. Lab work is reviewed.  Prior to admission, the patient denies chest pain, shortness of breath, dizziness, palpitations, or syncope.  She is recovering from her stroke with plans to return home  at discharge.  Past Medical History:  Diagnosis Date  . Anemia 05/1985  . Carotid artery disease (HCC)    a. 02/2015 - 1-39% bilaterally.  . Chronic back pain   . Concussion   . Coronary artery disease    a. s/p DES to [facility] 01/2015.  . DDD (degenerative disc disease), cervical   . DDD (degenerative disc disease), lumbar   . Fracture of lower leg 07/2001   Right  . GERD (gastroesophageal reflux disease)   . Hyperlipidemia   . Hypertension   . Multinodular  thyroid   . Pneumonia ?1990's X 1  . PSVT (paroxysmal supraventricular tachycardia) (HCC)    a. s/p catheter ablation of a parahisian atrial tachycardia which was successfully ablated from the non-coronary cusp of the aortic root 06/2014.  [name] Renal cyst 07/10/2006   bilateral  . Right knee pain 02/2010   Injected - Dr. Shelle Iron  . Sinus pause    a. By event monitoring 09/2014 - BB stopped at that time.  . Syncope and collapse 07/2001   "that's when I broke my leg"  . Type II diabetes mellitus (HCC) 2002     Surgical History:  Past Surgical History:  Procedure Laterality Date  . ANTERIOR CERVICAL DECOMP/DISCECTOMY FUSION  10/03/2005   C4/5; C5/6  "it's got a plate in there"  . ANTERIOR LAT LUMBAR FUSION Left 02/04/2018   Procedure: Lumbar two-three Lumbar three-four Lumbar four-five  Anterolateral decompression/percutaneous posterior arthrodesis, Mazor;  Surgeon: Barnett Abu, MD;  Location: [facility] OR;  Service: Neurosurgery;  Laterality: Left;  . APPLICATION OF ROBOTIC ASSISTANCE FOR SPINAL PROCEDURE N/A 02/04/2018   Procedure: APPLICATION OF ROBOTIC ASSISTANCE FOR SPINAL PROCEDURE;  Surgeon: Barnett Abu, MD;  Location: [facility] OR;  Service: Neurosurgery;  Laterality: N/A;  . BACK SURGERY    . BREAST BIOPSY Bilateral 1968 (multiple)   "all benign"  . BREAST LUMPECTOMY Bilateral 1968  . CARDIAC CATHETERIZATION N/A 11/18/2014   Procedure: Left Heart Cath and Coronary Angiography;  Surgeon: Peter M Swaziland, MD;  Location: [facility] INVASIVE CV LAB;  Service: Cardiovascular;  Laterality:  N/A;  . CARDIAC CATHETERIZATION N/A 01/22/2015   Procedure: Coronary Stent Intervention;  Surgeon: Peter M Swaziland, MD;  Location: [facility] INVASIVE CV LAB;  Service: Cardiovascular;  Laterality: N/A;  . CARDIAC CATHETERIZATION N/A 06/10/2015   Procedure: Left Heart Cath and Coronary Angiography;  Surgeon: Peter M Swaziland, MD;  Location: [facility] INVASIVE CV LAB;  Service: Cardiovascular;  Laterality: N/A;  . CATARACT EXTRACTION W/ INTRAOCULAR  LENS  IMPLANT, BILATERAL Bilateral 12/2010  . CORONARY ANGIOPLASTY    . ESOPHAGOGASTRODUODENOSCOPY  08/14/2005   gastropathy biopsy, negative  . LAPAROSCOPIC CHOLECYSTECTOMY  08/1992  . LUMBAR PERCUTANEOUS PEDICLE SCREW 3 LEVEL N/A 02/04/2018   Procedure: LUMBAR PERCUTANEOUS PEDICLE SCREW LUMBAR TWO - LUMBAR FIVE;  Surgeon: Barnett Abu, MD;  Location: [facility] OR;  Service: Neurosurgery;  Laterality: N/A;  . POSTERIOR LAMINECTOMY / DECOMPRESSION LUMBAR SPINE  09/2001   due to herniated disc  . SUPRAVENTRICULAR TACHYCARDIA ABLATION N/A 07/06/2014   Procedure: SUPRAVENTRICULAR TACHYCARDIA ABLATION;  Surgeon: Marinus Maw, MD;  Location: [facility] CATH LAB;  Service: Cardiovascular;  Laterality: N/A;  . VAGINAL HYSTERECTOMY  1968   spotting     Medications Prior to Admission  Medication Sig Dispense Refill Last Dose  . Bempedoic Acid-Ezetimibe (NEXLIZET) 180-10 MG TABS Take 1 tablet by mouth daily. 90 tablet 3 08/18/2020 at Unknown time  . cyanocobalamin (,VITAMIN B-12,) 1000 MCG/ML injection 1000 mcg injected IM every 14 days.   Past Week at Unknown time  . glyBURIDE (DIABETA) 5 MG tablet Take 1 tablet (5 mg total) by mouth daily.   08/18/2020 at Unknown time  . loperamide (IMODIUM) 2 MG capsule Take 2 mg by mouth 4 (four) times daily as needed for diarrhea or loose stools.    08/18/2020 at Unknown time  . nitroGLYCERIN (NITROSTAT) 0.4 MG SL tablet PLACE 1 TABLET UNDER THE TONGUE EVERY 5 MINUTES AS NEEDED FOR CHEST PAIN FOR UP TO 3 DOSES 25 tablet 5 PRN  . omeprazole (PRILOSEC) 20 MG capsule Take 1 capsule (20 mg total) by mouth 2 (two) times daily as needed (for acid reflux/indigestion.).   08/18/2020 at Unknown time  . ONETOUCH ULTRA test strip USE AS INSTRUCTED TO TEST BLOOD SUGAR THREE TIMES DAILY. 100 strip 3   . oxyCODONE (OXY IR/ROXICODONE) 5 MG immediate release tablet Take 5 mg by mouth every 6 (six) hours as needed.   08/18/2020 at Unknown time  . traMADol (ULTRAM) 50 MG tablet TAKE 1 TABLET BY MOUTH  EVERY 6 HOURS AS NEEDED 60 tablet 2 Past Week at Unknown time    Inpatient Medications:  . aspirin EC  325 mg Oral Daily  . Bempedoic Acid-Ezetimibe  1 tablet Oral Daily  . clopidogrel  75 mg Oral Daily  . insulin aspart  0-5 Units Subcutaneous QHS  . insulin aspart  0-9 Units Subcutaneous TID WC    Allergies:  Allergies  Allergen Reactions  . Crestor [Rosuvastatin] Other (See Comments)    MYALGIAS  . Ezetimibe-Simvastatin Other (See Comments)    MYALGIAS  . Pravastatin Other (See Comments)    Myalgias  . Diazepam     Balance difficulty.    . Ezetimibe   . Lyrica [Pregabalin]     swelling  . Nsaids     UNSPECIFIED REACTION   . Praluent [Alirocumab]     myalgias  . Repatha [Evolocumab]     Hoarse voice  . Acetaminophen Anxiety and Other (See Comments)    REACTION: jittery with plain tylenol, but tolerated tylenol/benadryl combination  .  Carvedilol Other (See Comments)    nightmares  . Gabapentin Diarrhea  . Ibuprofen Other (See Comments)    REACTION: GI upset at high doses  . Insulin Glargine Rash    Rash  . Levemir [Insulin Detemir] Rash  . Macrobid [Nitrofurantoin Macrocrystal] Nausea And Vomiting and Other (See Comments)    ACHES  . Metformin Nausea And Vomiting    REACTION: GI upset  . Metoprolol Tartrate Other (See Comments)    REACTION: nightmares  . Naproxen Nausea And Vomiting    REACTION: GI upset    Social History   Socioeconomic History  . Marital status: Married    Spouse name: Not on file  . Number of children: 2  . Years of education: Not on file  . Highest education level: Not on file  Occupational History  . Occupation: Retired; Hotel manager, [employer]    Employer: RETIRED    Comment: Works a couple days a week  Tobacco Use  . Smoking status: Never Smoker  . Smokeless tobacco: Never Used  Vaping Use  . Vaping Use: Never used  Substance and Sexual Activity  . Alcohol use: No    Alcohol/week: 0.0 standard drinks   . Drug use: No  . Sexual activity: Yes  Other Topics Concern  . Not on file  Social History Narrative   Widowed 2011 after 52 years of marriage (husband had C.diff)   Remarried 2015   Right handed   One story home   Occasional caffeine   Social Determinants of Health   Financial Resource Strain: Not on file  Food Insecurity: Not on file  Transportation Needs: Not on file  Physical Activity: Not on file  Stress: Not on file  Social Connections: Not on file  Intimate Partner Violence: Not on file     Family History  Problem Relation Age of Onset  . Hypertension Mother   . Heart disease Mother   . Diabetes Sister   . Myasthenia gravis Sister   . Diabetes Brother        1/2 juvenile; foot ulcer; periph. neuropathy  . Heart disease Maternal Grandfather        MI  . Hypertension Maternal Grandfather   . Depression Paternal Aunt   . Hypertension Maternal Grandmother   . Diabetes Maternal Grandmother   . Stroke Maternal Grandmother   . Hypertension Paternal Grandmother   . Diabetes Paternal Grandmother   . Hypertension Paternal Grandfather   . Diabetes Paternal Grandfather   . Breast cancer Maternal Aunt   . Colon cancer Neg Hx       Review of Systems: All other systems reviewed and are otherwise negative except as noted above.  Physical Exam: Vitals:   08/19/20 0430 08/19/20 0705 08/19/20 0755 08/19/20 1015  BP: (!) 121/106 (!) 121/106 (!) 153/87 (!) 156/81  Pulse: 95 95 82   Resp: Temp:  98.6 F (37 C) 98 F (36.7 C) 98.6 F (37 C)  TempSrc:   Oral Oral  SpO2: 99% 99%    Weight:      Height:        GEN- The patient is well appearing, alert and oriented x 3 today.   Head- normocephalic, atraumatic Eyes-  Sclera clear, conjunctiva pink Ears- hearing intact Oropharynx- clear Neck- supple Lungs- Clear to ausculation bilaterally, normal work of breathing Heart- Regular rate and rhythm, no murmurs, rubs or gallops  GI- soft, NT, ND, +  BS Extremities- no clubbing,  cyanosis, or edema MS- no significant deformity or atrophy Skin- no rash or lesion Psych- euthymic mood, full affect   Labs:   Lab Results  Component Value Date   WBC 9.5 08/19/2020   HGB 11.8 (L) 08/19/2020   HCT 35.0 (L) 08/19/2020   MCV 92.3 08/19/2020   PLT 345 08/19/2020    Recent Labs  Lab 08/19/20 0606  NA 139  K 4.0  CL 105  CO2 28  BUN 9  CREATININE 0.77  CALCIUM 9.1  GLUCOSE 237*     Radiology/Studies: CT Angio Head W/Cm &/Or Wo Cm  Result Date: 08/19/2020 CLINICAL DATA:  Initial evaluation for acute TIA. EXAM: CT ANGIOGRAPHY HEAD AND NECK TECHNIQUE: Multidetector CT imaging of the head and neck was performed using the standard protocol during bolus administration of intravenous contrast. Multiplanar CT image reconstructions and MIPs were obtained to evaluate the vascular anatomy. Carotid stenosis measurements (when applicable) are obtained utilizing NASCET criteria, using the distal internal carotid diameter as the denominator. CONTRAST:  55mL OMNIPAQUE IOHEXOL 350 MG/ML SOLN COMPARISON:  Prior head CT from 09/21/2019 FINDINGS: CT HEAD FINDINGS Brain: Age-related cerebral atrophy with moderate to advanced chronic microvascular ischemic disease. Remote lacunar infarcts at the left basal ganglia and right frontal centrum semi ovale. No acute intracranial hemorrhage. No acute large vessel territory infarct. Suspected 7 mm meningioma overlying the right frontal convexity without significant mass effect (series 11, image 67). No other mass lesion, midline shift or mass effect. No hydrocephalus or extra-axial fluid collection. Vascular: No hyperdense vessel. Scattered vascular calcifications noted within the carotid siphons. Skull: Scalp soft tissues and calvarium within normal limits. Sinuses: Paranasal sinuses mastoid air cells are clear. Orbits: Globes and orbital soft tissues demonstrate no acute finding. Review of the MIP images confirms the above  findings CTA NECK FINDINGS Aortic arch: Visualized aortic arch normal caliber with normal 3 vessel morphology. Mild atheromatous change about the origin of the great vessels without hemodynamically significant stenosis. Right carotid system: Right CCA patent from its origin to the bifurcation without stenosis. Mild calcified plaque about the right carotid bulb/proximal right ICA without significant stenosis. Right ICA widely patent distally without stenosis, dissection or occlusion. Left carotid system: Left CCA patent from its origin to the bifurcation without stenosis. Mild eccentric mixed plaque about the left carotid bulb without significant stenosis. Left ICA patent distally without stenosis, dissection or occlusion. Vertebral arteries: Both vertebral arteries arise from subclavian arteries. Vertebral arteries widely patent without stenosis, dissection or occlusion. Skeleton: No visible acute osseous finding. No discrete or worrisome osseous lesions. Prior fusion at C4-C6. Grade 1 anterolisthesis of C7 on T1. Other neck: No other acute soft tissue abnormality within the neck. No mass or adenopathy. 1.3 cm left thyroid nodule, felt to be of doubtful significance given size and patient age, no follow-up imaging recommended (ref: J Am Coll Radiol. 2015 Feb;12(2): 143-50). Upper chest: Visualized upper chest demonstrates no acute finding. Review of the MIP images confirms the above findings CTA HEAD FINDINGS Anterior circulation: Petrous segments patent bilaterally. Scattered atheromatous change within the carotid siphons with associated mild multifocal narrowing. A1 segments patent bilaterally. Normal anterior communicating artery complex. Left ACA widely patent to its distal aspect. There is age indeterminate occlusion of the distal right A2 segment (series 11, image 86). Attenuated irregular flow seen distally within right ACA branches, likely collateral in nature. M1 segments patent without stenosis. Normal MCA  bifurcations. No proximal MCA branch occlusion. Diffuse small vessel atheromatous irregularity throughout the MCA  branches bilaterally. Posterior circulation: Both V4 segments patent to the vertebrobasilar junction without stenosis. Right vertebral slightly dominant. Both PICA origins patent and normal. Basilar patent to its distal aspect without stenosis. Superior cerebellar arteries patent bilaterally. Both PCAs primarily supplied via the basilar. Atheromatous irregularity within both PCAs without high-grade stenosis. Venous sinuses: Grossly patent allowing for timing the contrast bolus. Anatomic variants: None significant.  No aneurysm. Review of the MIP images confirms the above findings IMPRESSION: CT HEAD IMPRESSION: 1. No acute intracranial abnormality identified. Specifically, no evidence for acute or evolving ischemia. 2. Moderate to advanced chronic microvascular ischemic disease. 3. Probable 7 mm hemangioma overlying the right frontal convexity without associated mass effect. CTA HEAD AND NECK IMPRESSION: 1. 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 Attenuated and irregular flow is seen distally within right ACA branches, likely collateral. 2. Mild atherosclerotic change about the carotid bifurcations and carotid siphons without hemodynamically significant stenosis. 3. Diffuse small vessel atheromatous irregularity elsewhere throughout the intracranial circulation Electronically Signed   By: Rise Mu M.D.   On: 08/19/2020 00:33   CT Angio Neck W and/or Wo Contrast  Result Date: 08/19/2020 CLINICAL DATA:  Initial evaluation for acute TIA. EXAM: CT ANGIOGRAPHY HEAD AND NECK TECHNIQUE: Multidetector CT imaging of the head and neck was performed using the standard protocol during bolus administration of intravenous contrast. Multiplanar CT image reconstructions and MIPs were obtained to evaluate the vascular anatomy. Carotid stenosis measurements (when applicable) are  obtained utilizing NASCET criteria, using the distal internal carotid diameter as the denominator. CONTRAST:  75mL OMNIPAQUE IOHEXOL 350 MG/ML SOLN COMPARISON:  Prior head CT from 09/21/2019 FINDINGS: CT HEAD FINDINGS Brain: Age-related cerebral atrophy with moderate to advanced chronic microvascular ischemic disease. Remote lacunar infarcts at the left basal ganglia and right frontal centrum semi ovale. No acute intracranial hemorrhage. No acute large vessel territory infarct. Suspected 7 mm meningioma overlying the right frontal convexity without significant mass effect (series 11, image 67). No other mass lesion, midline shift or mass effect. No hydrocephalus or extra-axial fluid collection. Vascular: No hyperdense vessel. Scattered vascular calcifications noted within the carotid siphons. Skull: Scalp soft tissues and calvarium within normal limits. Sinuses: Paranasal sinuses mastoid air cells are clear. Orbits: Globes and orbital soft tissues demonstrate no acute finding. Review of the MIP images confirms the above findings CTA NECK FINDINGS Aortic arch: Visualized aortic arch normal caliber with normal 3 vessel morphology. Mild atheromatous change about the origin of the great vessels without hemodynamically significant stenosis. Right carotid system: Right CCA patent from its origin to the bifurcation without stenosis. Mild calcified plaque about the right carotid bulb/proximal right ICA without significant stenosis. Right ICA widely patent distally without stenosis, dissection or occlusion. Left carotid system: Left CCA patent from its origin to the bifurcation without stenosis. Mild eccentric mixed plaque about the left carotid bulb without significant stenosis. Left ICA patent distally without stenosis, dissection or occlusion. Vertebral arteries: Both vertebral arteries arise from subclavian arteries. Vertebral arteries widely patent without stenosis, dissection or occlusion. Skeleton: No visible acute  osseous finding. No discrete or worrisome osseous lesions. Prior fusion at C4-C6. Grade 1 anterolisthesis of C7 on T1. Other neck: No other acute soft tissue abnormality within the neck. No mass or adenopathy. 1.3 cm left thyroid nodule, felt to be of doubtful significance given size and patient age, no follow-up imaging recommended (ref: J Am Coll Radiol. 2015 Feb;12(2): 143-50). Upper chest: Visualized upper chest demonstrates no acute finding. Review of the  MIP images confirms the above findings CTA HEAD FINDINGS Anterior circulation: Petrous segments patent bilaterally. Scattered atheromatous change within the carotid siphons with associated mild multifocal narrowing. A1 segments patent bilaterally. Normal anterior communicating artery complex. Left ACA widely patent to its distal aspect. There is age indeterminate occlusion of the distal right A2 segment (series 11, image 86). Attenuated irregular flow seen distally within right ACA branches, likely collateral in nature. M1 segments patent without stenosis. Normal MCA bifurcations. No proximal MCA branch occlusion. Diffuse small vessel atheromatous irregularity throughout the MCA branches bilaterally. Posterior circulation: Both V4 segments patent to the vertebrobasilar junction without stenosis. Right vertebral slightly dominant. Both PICA origins patent and normal. Basilar patent to its distal aspect without stenosis. Superior cerebellar arteries patent bilaterally. Both PCAs primarily supplied via the basilar. Atheromatous irregularity within both PCAs without high-grade stenosis. Venous sinuses: Grossly patent allowing for timing the contrast bolus. Anatomic variants: None significant.  No aneurysm. Review of the MIP images confirms the above findings IMPRESSION: CT HEAD IMPRESSION: 1. No acute intracranial abnormality identified. Specifically, no evidence for acute or evolving ischemia. 2. Moderate to advanced chronic microvascular ischemic disease. 3.  Probable 7 mm hemangioma overlying the right frontal convexity without associated mass effect. CTA HEAD AND NECK IMPRESSION: 1. 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 Attenuated and irregular flow is seen distally within right ACA branches, likely collateral. 2. Mild atherosclerotic change about the carotid bifurcations and carotid siphons without hemodynamically significant stenosis. 3. Diffuse small vessel atheromatous irregularity elsewhere throughout the intracranial circulation Electronically Signed   By: Rise MuBenjamin  McClintock M.D.   On: 08/19/2020 00:33   MR BRAIN WO CONTRAST  Result Date: 08/19/2020 CLINICAL DATA:  80 year old female TIA. Age indeterminate right ACA distal A2 segment occlusion on CTA yesterday. EXAM: MRI HEAD WITHOUT CONTRAST TECHNIQUE: Multiplanar, multiecho pulse sequences of the brain and surrounding structures were obtained without intravenous contrast. COMPARISON:  CTA head and neck yesterday. Head CT 09/21/2019 and earlier. Brain MRI 11/24/2005. FINDINGS: Brain: Small area of partially restricted diffusion in the right superior frontal gyrus white matter (series 2, images 39-41) which surrounds a small cystic area of right corona radiata white matter encephalomalacia. No other restricted diffusion. Underlying confluent bilateral white matter T2 and FLAIR signal abnormality which has substantially progressed since the 2007 MRI. Fairly symmetric involvement in both hemispheres. Superimposed mild to moderate T2 heterogeneity throughout the bilateral deep gray matter nuclei and central pons. Some of the basal ganglia signal changes appear due to dilated perivascular spaces. No superimposed cortical encephalomalacia. Small chronic microhemorrhage in the left temporal lobe white matter on series 7, image 39. No other chronic cerebral blood products. No midline shift, mass effect, evidence of mass lesion, ventriculomegaly, extra-axial collection or acute intracranial  hemorrhage. Cervicomedullary junction and pituitary are within normal limits. Vascular: Major intracranial vascular flow voids are stable since 2007. Skull and upper cervical spine: Chronic cervical ACDF, otherwise normal for age visible cervical spine. Visualized bone marrow signal is within normal limits. Sinuses/Orbits: Postoperative changes to both globes. Paranasal Visualized paranasal sinuses and mastoids are stable and well aerated. Other: Grossly normal visible internal auditory structures. Visible scalp and face soft tissues appear negative. IMPRESSION: 1. Small acute on chronic white matter infarct at the right corona radiata, right superior frontal gyrus. No associated hemorrhage or mass effect. 2. No other acute ischemia or acute intracranial abnormality. Underlying advanced bilateral white matter disease and moderate signal changes in the bilateral deep gray nuclei and pons also  likely due to chronic small vessel ischemia. Electronically Signed   By: Odessa Fleming M.D.   On: 08/19/2020 07:18   MR Lumbar Spine W Wo Contrast  Result Date: 08/12/2020 CLINICAL DATA:  Low back pain.  Prior back surgery 2019. EXAM: MRI LUMBAR SPINE WITHOUT AND WITH CONTRAST TECHNIQUE: Multiplanar and multiecho pulse sequences of the lumbar spine were obtained without and with intravenous contrast. CONTRAST:  14mL MULTIHANCE GADOBENATE DIMEGLUMINE 529 MG/ML IV SOLN COMPARISON:  MRI lumbar spine 07/16/2018 FINDINGS: Segmentation:  Normal Alignment: 5 mm retrolisthesis T12-L1 has progressed in the interval. 5 mm retrolisthesis L1-2 unchanged.  Remaining alignment anatomic. Vertebrae:  Negative for fracture or mass. Hardware: Pedicle screw and interbody fusion L2 through L5 unchanged from the prior study. Conus medullaris and cauda equina: Conus extends to the L1 level. Conus and cauda equina appear normal. Paraspinal and other soft tissues: Negative for paraspinous mass, adenopathy, fluid collection. Large 7 cm right renal cyst  unchanged. Disc levels: T12-L1: Progressive disc degeneration with disc space narrowing and spurring asymmetric to the left. Progressive retrolisthesis. Bilateral facet hypertrophy. Moderate subarticular and foraminal stenosis on the left has progressed in the interval. Mild spinal stenosis L1-2: 5 mm retrolisthesis with disc degeneration and spurring and bilateral facet degeneration. Mild left subarticular stenosis unchanged. L2-3: Pedicle screw and interbody fusion.  Negative for stenosis L3-4: Pedicle screw and interbody fusion.  Negative for stenosis L4-5: Pedicle screw and interbody fusion.  Negative for stenosis L5-S1: Normal disc space. Moderate facet degeneration. No significant foraminal stenosis. IMPRESSION: Progressive disc degeneration at T12-L1 with retrolisthesis and spurring. Moderate subarticular and foraminal stenosis on the left and mild spinal stenosis 5 mm retrolisthesis L1-2 unchanged with mild left subarticular stenosis. PLIF L2 through L5 without stenosis. Electronically Signed   By: Marlan Palau M.D.   On: 08/12/2020 10:39    12-lead ECG on arrival shows NSR at 95 with underlying RBBB and LAFB (personally reviewed) All prior EKG's in EPIC reviewed with no documented atrial fibrillation  Telemetry shows NSR/ sinus tach 90-110s (personally reviewed)  Assessment and Plan:  1. Cryptogenic stroke The patient presents with cryptogenic stroke.  The patient does not have a TEE planned for this AM.  I spoke at length with the patient about monitoring for afib with an implantable loop recorder.  Risks, benefits, and alteratives to implantable loop recorder were discussed with the patient today.   At this time, the patient is very clear in their decision to proceed with implantable loop recorder.   Wound care was reviewed with the patient (keep incision clean and dry for 3 days).  Wound check scheduled and entered in AVS. Please call with questions.   2. Paroxysmal SVT Difficult case  in 2016 with Dr. Ladona Ridgel She has not had documented fib. He would recommend proceeding with loop recorder.   3. Frequent falls  With no syncopal component per husband, though most recent was "months" ago. Several times she was aware of "giving out" and able to be brought to a seated position.  She has previously been taken off BB due to bradycardia  Loop recorder will also help elucidate this issue.   Graciella Freer, PA-C 08/19/2020 12:04 PM  EP attending  Patient seen and examined.  Agree with the findings as noted above by physician assistant Otilio Saber.  The patient is a very pleasant 80 year old woman with a history of atrial tachycardia originating from the His bundle region.  She underwent successful ablation of this tachycardia approximately 5  years ago.  The patient was in her usual state of health when she slipped obtained a stroke resulting in aphasia.  She has had a very marked improvement.  She has undergone evaluation which has demonstrated no obvious explanation for her stroke.  Telemetry is demonstrated sinus rhythm.  She presents now to consider implantable loop recorder insertion.  I discussed the treatment options with the patient in detail along with her's husband.  The risk, benefits, goals, and expectations of the procedure were reviewed and she would like to proceed.  Lewayne Bunting, MD

## 2020-08-19 NOTE — Progress Notes (Signed)
  Echocardiogram 2D Echocardiogram has been performed.  Janalyn Harder 08/19/2020, 11:21 AM

## 2020-08-19 NOTE — Progress Notes (Signed)
Called by RN.  Pt had order for IV magnesium earlier today and then to discharge after the magnesium infused. Pt had lost IV and it was just reestablished a short time ago. Magnesium is being started but will take two hours to infuse.  Husband stated to nurse over the phone that it would be too late for him to come to hospital to pick up Maria [name] after the magnesium is infused.  Discharge is held overnight and pt will have to be discharged tomorrow am.

## 2020-08-19 NOTE — Progress Notes (Addendum)
STROKE TEAM PROGRESS NOTE   INTERVAL HISTORY Her husband is at the bedside.  On exam today patient appears a bit tired, but is able to answer all questions with some assistance from her husband, and is able to fully participate in exam. Patient reports that she recalled having sudden difficulty yesterday "getting my words out and using my cell phone." Patient reports that her daughter told her she did not understand the patient and patient recalls being frustrated and knowing what she wanted to say, but not be able to communicate her thoughts. Patient also was unable to recognize her daughter and husband. Patient reports this lasted 1.5- 2 hours. Patient denied having headache or vision changes, but did report diffuse muscle weakness that has persisted. Patient and husband also endorsed a significant hx of 30+ falls with the most recent being 6-8 months ago. Patient reported that she would often lose her balance and feel dizzy prior to falling. Patient endorsed a history of LOC with some falls.   Vitals:   08/19/20 0030 08/19/20 0430 08/19/20 0705 08/19/20 0755  BP: 132/61 (!) 121/106 (!) 121/106 (!) 153/87  Pulse: 87 95 95 82  Resp: 13 20 20 20   Temp:   98.6 F (37 C) 98 F (36.7 C)  TempSrc:    Oral  SpO2: 99% 99% 99%   Weight:      Height:       CBC:  Recent Labs  Lab 08/18/20 2117 08/19/20 0606  WBC 9.4 9.5  NEUTROABS 5.9  --   HGB 11.4* 11.8*  HCT 33.5* 35.0*  MCV 91.8 92.3  PLT 372 345   Basic Metabolic Panel:  Recent Labs  Lab 08/18/20 2117 08/19/20 0606  NA 139 139  K 3.4* 4.0  CL 103 105  CO2 26 28  GLUCOSE 337* 237*  BUN 12 9  CREATININE 0.93 0.77  CALCIUM 8.8* 9.1  MG  --  1.6*   Lipid Panel:  Recent Labs  Lab 08/19/20 0606  CHOL 190  TRIG 85  HDL 46  CHOLHDL 4.1  VLDL 17  LDLCALC 10/19/20*   HgbA1c:  Recent Labs  Lab 08/19/20 0606  HGBA1C 9.4*   Urine Drug Screen: No results for input(s): LABOPIA, COCAINSCRNUR, LABBENZ, AMPHETMU, THCU, LABBARB in  the last 168 hours.  Alcohol Level No results for input(s): ETH in the last 168 hours.  IMAGING past 24 hours CT Angio Head W/Cm &/Or Wo Cm  Result Date: 08/19/2020 CLINICAL DATA:  Initial evaluation for acute TIA. EXAM: CT ANGIOGRAPHY HEAD AND NECK TECHNIQUE: Multidetector CT imaging of the head and neck was performed using the standard protocol during bolus administration of intravenous contrast. Multiplanar CT image reconstructions and MIPs were obtained to evaluate the vascular anatomy. Carotid stenosis measurements (when applicable) are obtained utilizing NASCET criteria, using the distal internal carotid diameter as the denominator. CONTRAST:  73mL OMNIPAQUE IOHEXOL 350 MG/ML SOLN COMPARISON:  Prior head CT from 09/21/2019 FINDINGS: CT HEAD FINDINGS Brain: Age-related cerebral atrophy with moderate to advanced chronic microvascular ischemic disease. Remote lacunar infarcts at the left basal ganglia and right frontal centrum semi ovale. No acute intracranial hemorrhage. No acute large vessel territory infarct. Suspected 7 mm meningioma overlying the right frontal convexity without significant mass effect (series 11, image 67). No other mass lesion, midline shift or mass effect. No hydrocephalus or extra-axial fluid collection. Vascular: No hyperdense vessel. Scattered vascular calcifications noted within the carotid siphons. Skull: Scalp soft tissues and calvarium within normal limits. Sinuses: Paranasal  sinuses mastoid air cells are clear. Orbits: Globes and orbital soft tissues demonstrate no acute finding. Review of the MIP images confirms the above findings CTA NECK FINDINGS Aortic arch: Visualized aortic arch normal caliber with normal 3 vessel morphology. Mild atheromatous change about the origin of the great vessels without hemodynamically significant stenosis. Right carotid system: Right CCA patent from its origin to the bifurcation without stenosis. Mild calcified plaque about the right carotid  bulb/proximal right ICA without significant stenosis. Right ICA widely patent distally without stenosis, dissection or occlusion. Left carotid system: Left CCA patent from its origin to the bifurcation without stenosis. Mild eccentric mixed plaque about the left carotid bulb without significant stenosis. Left ICA patent distally without stenosis, dissection or occlusion. Vertebral arteries: Both vertebral arteries arise from subclavian arteries. Vertebral arteries widely patent without stenosis, dissection or occlusion. Skeleton: No visible acute osseous finding. No discrete or worrisome osseous lesions. Prior fusion at C4-C6. Grade 1 anterolisthesis of C7 on T1. Other neck: No other acute soft tissue abnormality within the neck. No mass or adenopathy. 1.3 cm left thyroid nodule, felt to be of doubtful significance given size and patient age, no follow-up imaging recommended (ref: J Am Coll Radiol. 2015 Feb;12(2): 143-50). Upper chest: Visualized upper chest demonstrates no acute finding. Review of the MIP images confirms the above findings CTA HEAD FINDINGS Anterior circulation: Petrous segments patent bilaterally. Scattered atheromatous change within the carotid siphons with associated mild multifocal narrowing. A1 segments patent bilaterally. Normal anterior communicating artery complex. Left ACA widely patent to its distal aspect. There is age indeterminate occlusion of the distal right A2 segment (series 11, image 86). Attenuated irregular flow seen distally within right ACA branches, likely collateral in nature. M1 segments patent without stenosis. Normal MCA bifurcations. No proximal MCA branch occlusion. Diffuse small vessel atheromatous irregularity throughout the MCA branches bilaterally. Posterior circulation: Both V4 segments patent to the vertebrobasilar junction without stenosis. Right vertebral slightly dominant. Both PICA origins patent and normal. Basilar patent to its distal aspect without  stenosis. Superior cerebellar arteries patent bilaterally. Both PCAs primarily supplied via the basilar. Atheromatous irregularity within both PCAs without high-grade stenosis. Venous sinuses: Grossly patent allowing for timing the contrast bolus. Anatomic variants: None significant.  No aneurysm. Review of the MIP images confirms the above findings IMPRESSION: CT HEAD IMPRESSION: 1. No acute intracranial abnormality identified. Specifically, no evidence for acute or evolving ischemia. 2. Moderate to advanced chronic microvascular ischemic disease. 3. Probable 7 mm hemangioma overlying the right frontal convexity without associated mass effect. CTA HEAD AND NECK IMPRESSION: 1. 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 Attenuated and irregular flow is seen distally within right ACA branches, likely collateral. 2. Mild atherosclerotic change about the carotid bifurcations and carotid siphons without hemodynamically significant stenosis. 3. Diffuse small vessel atheromatous irregularity elsewhere throughout the intracranial circulation Electronically Signed   By: Rise Mu M.D.   On: 08/19/2020 00:33   CT Angio Neck W and/or Wo Contrast  Result Date: 08/19/2020 CLINICAL DATA:  Initial evaluation for acute TIA. EXAM: CT ANGIOGRAPHY HEAD AND NECK TECHNIQUE: Multidetector CT imaging of the head and neck was performed using the standard protocol during bolus administration of intravenous contrast. Multiplanar CT image reconstructions and MIPs were obtained to evaluate the vascular anatomy. Carotid stenosis measurements (when applicable) are obtained utilizing NASCET criteria, using the distal internal carotid diameter as the denominator. CONTRAST:  59mL OMNIPAQUE IOHEXOL 350 MG/ML SOLN COMPARISON:  Prior head CT from 09/21/2019 FINDINGS: CT  HEAD FINDINGS Brain: Age-related cerebral atrophy with moderate to advanced chronic microvascular ischemic disease. Remote lacunar infarcts at the left  basal ganglia and right frontal centrum semi ovale. No acute intracranial hemorrhage. No acute large vessel territory infarct. Suspected 7 mm meningioma overlying the right frontal convexity without significant mass effect (series 11, image 67). No other mass lesion, midline shift or mass effect. No hydrocephalus or extra-axial fluid collection. Vascular: No hyperdense vessel. Scattered vascular calcifications noted within the carotid siphons. Skull: Scalp soft tissues and calvarium within normal limits. Sinuses: Paranasal sinuses mastoid air cells are clear. Orbits: Globes and orbital soft tissues demonstrate no acute finding. Review of the MIP images confirms the above findings CTA NECK FINDINGS Aortic arch: Visualized aortic arch normal caliber with normal 3 vessel morphology. Mild atheromatous change about the origin of the great vessels without hemodynamically significant stenosis. Right carotid system: Right CCA patent from its origin to the bifurcation without stenosis. Mild calcified plaque about the right carotid bulb/proximal right ICA without significant stenosis. Right ICA widely patent distally without stenosis, dissection or occlusion. Left carotid system: Left CCA patent from its origin to the bifurcation without stenosis. Mild eccentric mixed plaque about the left carotid bulb without significant stenosis. Left ICA patent distally without stenosis, dissection or occlusion. Vertebral arteries: Both vertebral arteries arise from subclavian arteries. Vertebral arteries widely patent without stenosis, dissection or occlusion. Skeleton: No visible acute osseous finding. No discrete or worrisome osseous lesions. Prior fusion at C4-C6. Grade 1 anterolisthesis of C7 on T1. Other neck: No other acute soft tissue abnormality within the neck. No mass or adenopathy. 1.3 cm left thyroid nodule, felt to be of doubtful significance given size and patient age, no follow-up imaging recommended (ref: J Am Coll Radiol.  2015 Feb;12(2): 143-50). Upper chest: Visualized upper chest demonstrates no acute finding. Review of the MIP images confirms the above findings CTA HEAD FINDINGS Anterior circulation: Petrous segments patent bilaterally. Scattered atheromatous change within the carotid siphons with associated mild multifocal narrowing. A1 segments patent bilaterally. Normal anterior communicating artery complex. Left ACA widely patent to its distal aspect. There is age indeterminate occlusion of the distal right A2 segment (series 11, image 86). Attenuated irregular flow seen distally within right ACA branches, likely collateral in nature. M1 segments patent without stenosis. Normal MCA bifurcations. No proximal MCA branch occlusion. Diffuse small vessel atheromatous irregularity throughout the MCA branches bilaterally. Posterior circulation: Both V4 segments patent to the vertebrobasilar junction without stenosis. Right vertebral slightly dominant. Both PICA origins patent and normal. Basilar patent to its distal aspect without stenosis. Superior cerebellar arteries patent bilaterally. Both PCAs primarily supplied via the basilar. Atheromatous irregularity within both PCAs without high-grade stenosis. Venous sinuses: Grossly patent allowing for timing the contrast bolus. Anatomic variants: None significant.  No aneurysm. Review of the MIP images confirms the above findings IMPRESSION: CT HEAD IMPRESSION: 1. No acute intracranial abnormality identified. Specifically, no evidence for acute or evolving ischemia. 2. Moderate to advanced chronic microvascular ischemic disease. 3. Probable 7 mm hemangioma overlying the right frontal convexity without associated mass effect. CTA HEAD AND NECK IMPRESSION: 1. 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 Attenuated and irregular flow is seen distally within right ACA branches, likely collateral. 2. Mild atherosclerotic change about the carotid bifurcations and carotid  siphons without hemodynamically significant stenosis. 3. Diffuse small vessel atheromatous irregularity elsewhere throughout the intracranial circulation Electronically Signed   By: Rise MuBenjamin  McClintock M.D.   On: 08/19/2020 00:33   MR  BRAIN WO CONTRAST  Result Date: 08/19/2020 CLINICAL DATA:  80 year old female TIA. Age indeterminate right ACA distal A2 segment occlusion on CTA yesterday. EXAM: MRI HEAD WITHOUT CONTRAST TECHNIQUE: Multiplanar, multiecho pulse sequences of the brain and surrounding structures were obtained without intravenous contrast. COMPARISON:  CTA head and neck yesterday. Head CT 09/21/2019 and earlier. Brain MRI 11/24/2005. FINDINGS: Brain: Small area of partially restricted diffusion in the right superior frontal gyrus white matter (series 2, images 39-41) which surrounds a small cystic area of right corona radiata white matter encephalomalacia. No other restricted diffusion. Underlying confluent bilateral white matter T2 and FLAIR signal abnormality which has substantially progressed since the 2007 MRI. Fairly symmetric involvement in both hemispheres. Superimposed mild to moderate T2 heterogeneity throughout the bilateral deep gray matter nuclei and central pons. Some of the basal ganglia signal changes appear due to dilated perivascular spaces. No superimposed cortical encephalomalacia. Small chronic microhemorrhage in the left temporal lobe white matter on series 7, image 39. No other chronic cerebral blood products. No midline shift, mass effect, evidence of mass lesion, ventriculomegaly, extra-axial collection or acute intracranial hemorrhage. Cervicomedullary junction and pituitary are within normal limits. Vascular: Major intracranial vascular flow voids are stable since 2007. Skull and upper cervical spine: Chronic cervical ACDF, otherwise normal for age visible cervical spine. Visualized bone marrow signal is within normal limits. Sinuses/Orbits: Postoperative changes to both  globes. Paranasal Visualized paranasal sinuses and mastoids are stable and well aerated. Other: Grossly normal visible internal auditory structures. Visible scalp and face soft tissues appear negative. IMPRESSION: 1. Small acute on chronic white matter infarct at the right corona radiata, right superior frontal gyrus. No associated hemorrhage or mass effect. 2. No other acute ischemia or acute intracranial abnormality. Underlying advanced bilateral white matter disease and moderate signal changes in the bilateral deep gray nuclei and pons also likely due to chronic small vessel ischemia. Electronically Signed   By: Odessa Fleming M.D.   On: 08/19/2020 07:18    PHYSICAL EXAM Constitutional: Appears well-developed and well-nourished.  Eyes: No scleral injection HENT: No oropharyngeal obstruction.  Good dentition. MSK: no joint deformities.  Cardiovascular: Normal rate and regular rhythm.  Respiratory: Effort normal, non-labored breathing Skin: Warm dry and intact visible skin  Neuro: Mental Status: Patient is awake, alert, oriented to person, place, month, year, and situation. Patient is able to give a clear and coherent history. No signs of aphasia or neglect Cranial Nerves: II: Visual Fields are full. Pupils are equal, round, and reactive to light.   III,IV, VI: EOMI without ptosis or diploplia.  V: Facial sensation is symmetric to temperature VII: Facial movement is symmetric.  VIII: hearing is intact to voice X: Uvula elevates symmetrically XI: Shoulder shrug and head turn is symmetric. XII: tongue is midline without atrophy or fasciculations.  Motor: Tone is normal. Bulk is normal. 5/5 strength in BUE, 4/5 in BLE Sensory: Sensation is symmetric to light touch and temperature in the arms and legs. Deep Tendon Reflexes: 2+ and symmetric in the triceps and patellae.  Plantars: Toes are downgoing bilaterally.  Cerebellar: FNF are intact bilaterally  ASSESSMENT/PLAN Ms. Maria [name]  is a 80 y.o. female with history of coronary artery disease (s/p DES to [facility] 01/2015), hypertension, hyperlipidemia, diabetes paroxysmal SVT s/p ablation, sinus pause for which beta-blocker was stopped, degenerative disc disease status post cervical spine and lumbar spine surgeries presenting with reports of AMS and aphasia. Patient cardiac hx so pSVT s/p ablation and reported hx of 30+ falls,  resembling syncopal episodes, is concerning that patient has intermittent A fib or other irregular heart beat that is placing her at increased stroke 2/2 cardioembolic etiology. Patient agreed to placement of a loop recorder to be monitored by her OP Cardiologist for further evaluation. Patient is also not on antihypertensives and at presentation has a sys although her pressure gradually decreased without therapeutic intervention. Patient was also noted to have occlusion of distal A2 and diffuse small vessel atheromatous irregularities further suggesting that HLD could be increasing risk for stroke. Unfortunately patient is unable to take statins due to significant myopathy.    Stroke:  right CR infarct likely secondary to R A2 occlusion, large vessel source. However, her aphasia and confusion episode concerning for left cortical TIA due to cardiac source  CT head no acute finding, Probable 7 mm hemangioma overlying the right frontal convexity without associated mass effect.  CTA head & neck - distal right A2 occlusion  MRI small acute on chronic white matter infarct at the right corona radiata, right superior frontal gyrus.  2D Echo EF 60-65%  Recommend Loop recorder placement to rule out afib, patient and husband verbally consented to placement  LDL 127  HgbA1c 9.4  No antithrombotic prior to admission, now on ASA 325 and Plavix 75 for 3 mon and then ASA alone given large vessel stenosis/occlusion  Therapy recommendations: outpt PT/OT  Disposition:  Home  Hx of pSVT s/p ablation Hx of sinus  pause  Loop Recorder placment . F/u w/ Cardiologist OP, see's Dr. [name] LV: 08/16/20  Hyperlipidemia  Continue home med:  nexlizet  LDL 127, goal < 70  Not tolerating statin in the past  Continue nexlizet on discharge  Diabetes type II  Restart home meds at d'c  HgbA1c 9.4, goal < 7.0  CBGs  SSI  Uncontrolled  Close PCP follow up for better DM control  HTN  Home meds - none  BP fluctuate   Recommend home BP monitoring  PCP follow up closely  Other Stroke Risk Factors  Advanced Age >/= 38   CAD status post stenting  Hospital day # 0  Eliseo Gum, MD PGY-1  ATTENDING NOTE: I reviewed above note and agree with the assessment and plan. Pt was seen and examined.   80 year old female with history of CAD status post stenting, hypertension, hyperlipidemia, diabetes, paroxysmal SVT status post ablation and sinus pause in 2016 admitted for episode of confusion, apraxia and word finding difficulty.  Currently resolved.  CT no acute abnormality.  CTA head neck showed right distal A2 occlusion.  MRI showed acute on chronic right ACA small infarct.  EF 60 to 65%.  A1c 9.4, LDL 127.  Creatinine 0.77.  On exam, patient mildly sleepy but able to follow simple commands and answer all questions appropriately.  No focal deficit.  Etiology for acute on chronic right ACA small infarct likely due to right A2 occlusion.  However, this likely not explain patient confusion, apraxia and word finding difficulty.  Still concerning for left MCA cortical TIA.  Patient had history of CAD status post stenting, proximal SVT and sinus pause status post ablation in 2016, advanced age, highly concerning for paroxysmal A. fib.  Recommend loop recorder to rule out A. fib.  Given intracranial vascular occlusions/stenosis, recommend aspirin 325 and Plavix 75 DAPT for 3 months and then aspirin alone.  Patient has history of statin intolerance, continue Nexlizet on discharge.  Stroke risk factor  modification.  PT/OT recommend outpatient PT/OT.  For detailed assessment and plan, please refer to above as I have made changes wherever appropriate.   Neurology will sign off. Please call with questions. Pt will follow up with stroke clinic NP at [facility] in about 4 weeks. Thanks for the consult.   Marvel Plan, MD PhD Stroke Neurology 08/19/2020 4:04 PM     To contact Stroke Continuity provider, please refer to [email or web address]. After hours, contact General Neurology

## 2020-08-19 NOTE — TOC Transition Note (Signed)
Transition of Care [facility]) - CM/SW Discharge Note   Patient Details  Name: SHALANE FLORENDO MRN: [id number] Date of Birth: 1940-09-16  Transition of Care [facility]) CM/SW Contact:  Kermit Balo, RN Phone Number: 08/19/2020, 4:00 PM   Clinical Narrative:    Patient is discharging home with home health services through [facility]. Cory with [facility] accepted the referral.  Pt denies issues with home medications or transportation. Pt will have 24 hour supervision at home between her spouse and daughter. Pt has shower seat at home and elevated commode so they are refusing recommended 3 in 1.  Pt has transport home when medically ready.   Final next level of care: Home w Home Health Services Barriers to Discharge: No Barriers Identified   Patient Goals and CMS Choice   CMS Medicare.gov Compare Post Acute Care list provided to:: Patient Choice offered to / list presented to : [facility]  Discharge Placement                       Discharge Plan and Services                          HH Arranged: PT,OT [facility] Agency: [facility] Date [facility] Agency Contacted: 08/19/20   Representative spoke with at [facility] Agency: Kandee Keen  Social Determinants of Health (SDOH) Interventions     Readmission Risk Interventions No flowsheet data found.

## 2020-08-20 LAB — GLUCOSE, CAPILLARY: Glucose-Capillary: 263 mg/dL — ABNORMAL HIGH (ref 70–99)

## 2020-08-20 NOTE — Progress Notes (Signed)
OT note:  Patient progressing towards OT goals.  She remains limited by decreased activity tolerance, impaired balance, generalized weakness and decreased cognition. Completing transfers and in room mobility with min guard fading to close supervision today using RW, reinforced safety with RW mgmt and fall prevention to have support with ADLs/mobility.  Completing grooming at sink with min guard, having to sit prior to ambulating back to recliner to due fatigue.  Continue to recommend HHOT services and 24/7 support at dc.  Will follow.    08/20/20 0900  OT Visit Information  Last OT Received On 08/20/20  Assistance Needed +1  History of Present Illness Pt is an 80 y/o female admitted 4/6 secondary to slurred speech and difficulty with word finding. 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 PMH includes HTN, CAD, and DM.  Precautions  Precautions Fall  Pain Assessment  Pain Assessment Faces  Faces Pain Scale 0  Pain Intervention(s) Monitored during session  Cognition  Arousal/Alertness Awake/alert  Behavior During Therapy WFL for tasks assessed/performed  Overall Cognitive Status Impaired/Different from baseline  Area of Impairment Safety/judgement;Awareness;Problem solving;Memory  Memory Decreased short-term memory  Safety/Judgement Decreased awareness of safety;Decreased awareness of deficits  Awareness Emergent  Problem Solving Slow processing;Difficulty sequencing;Requires verbal cues  General Comments continues to require cueing for safety during ADL routine and mobility, decreased awareness  ADL  Overall ADL's  Needs assistance/impaired  Grooming Min Warden/ranger Min guard;Ambulation;RW  Toilet Transfer Details (indicate cue type and reason) simulated to/from recliner  Functional mobility during ADLs Min guard;Rolling walker;Cueing for safety  General ADL Comments remains limited by weakness, balance, cognition and safety, pt requires min cueing for RW mgmt and  safety  Bed Mobility  General bed mobility comments in recliner upon arrival  Balance  Overall balance assessment Needs assistance  Sitting-balance support No upper extremity supported;Feet supported  Sitting balance-Leahy Scale Good  Standing balance support Bilateral upper extremity supported;Single extremity supported;During functional activity  Standing balance-Leahy Scale Poor  Standing balance comment relies on BUE support dynamically, at least 1 UE/leaning on sink during grooming tasks  Restrictions  Weight Bearing Restrictions No  Transfers  Overall transfer level Needs assistance  Equipment used Rolling walker (2 wheeled);Straight cane  Transfers Sit to/from Stand  Sit to Stand Min guard;Supervision  General transfer comment min guard fading to supervision, cueing for hand placement  General Comments  General comments (skin integrity, edema, etc.) edicated on energy conservation, safety with having chair at sink in bathroom and using RW for mobility with spouse supporting to prevent fall risk  OT - End of Session  Equipment Utilized During Treatment Rolling walker  Activity Tolerance Patient tolerated treatment well  Patient left in chair;with call bell/phone within reach;with chair alarm set  Nurse Communication Mobility status  OT Assessment/Plan  OT Plan Discharge plan remains appropriate;Frequency remains appropriate  OT Visit Diagnosis Other abnormalities of gait and mobility (R26.89);Muscle weakness (generalized) (M62.81);History of falling (Z91.81);Other symptoms and signs involving cognitive function  OT Frequency (ACUTE ONLY) Min 2X/week  Follow Up Recommendations Home health OT;Supervision/Assistance - 24 hour  OT Equipment 3 in 1 bedside commode  AM-PAC OT "6 Clicks" Daily Activity Outcome Measure (Version 2)  Help from another person eating meals? 3  Help from another person taking care of personal grooming? 3  Help from another person toileting, which includes  using toliet, bedpan, or urinal? 3  Help from another person bathing (including washing, rinsing, drying)? 2  Help from another person to put  on and taking off regular upper body clothing? 3  Help from another person to put on and taking off regular lower body clothing? 2  6 Click Score 16  OT Goal Progression  Progress towards OT goals Progressing toward goals  Acute Rehab OT Goals  Patient Stated Goal home today  OT Goal Formulation With patient  OT Time Calculation  OT Start Time (ACUTE ONLY) 0907  OT Stop Time (ACUTE ONLY) 3833  OT Time Calculation (min) 14 min  OT General Charges  $OT Visit 1 Visit  OT Treatments  $Self Care/Home Management  8-22 mins   Barry Brunner, OT Acute Rehabilitation Services Pager [phone] Office [phone]

## 2020-08-20 NOTE — Progress Notes (Signed)
Pt wheeled off unit with all her belongings

## 2020-08-20 NOTE — Progress Notes (Signed)
Progress Note/no charge note.  Patient had been discharged yesterday.  However discharge was delayed because she was getting IV magnesium, IV line got infiltrated and by the time the new one was placed it was quite late for family to pick up patient and hence she could not leave last night.  By the time I went to see the patient today, patient had just discharged home.  I discussed with patient's RN who reported that patient was stable without any acute issues.  I did not see the patient today.  I did confirm with TOC team that home health SLP would be arranged which was ordered quite late in the day yesterday.  Marcellus Scott, MD, [place], [facility]. [facility]  To contact the attending provider between 7A-7P or the covering provider during after hours 7P-7A, please log into the web site [email or web address] and access using universal [employer] password for that web site. If you do not have the password, please call the hospital operator.

## 2020-08-20 NOTE — Progress Notes (Signed)
Physical Therapy Treatment Patient Details Name: ANSHIKA PETHTEL MRN: [id number] DOB: 09/03/1940 Today's Date: 08/20/2020    History of Present Illness Pt is an 80 y/o female admitted 4/6 secondary to slurred speech and difficulty with word finding. 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 PMH includes HTN, CAD, and DM.    PT Comments    Patient progressing towards physical therapy goals. Patient in recliner on PT arrival and agreeable to session. Patient refused ambulation this session due to recently ambulating with OT and preparing for d/c. Patient performed standing exercises and repetitive sit to stands with supervision. Educated patient and spouse on energy conservation, use of RW for safety, and fall prevention. Continue to recommend HHPT following discharge to maximize functional mobility and independence.     Follow Up Recommendations  Home health PT;Supervision for mobility/OOB     Equipment Recommendations  None recommended by PT    Recommendations for Other Services       Precautions / Restrictions Precautions Precautions: Fall Restrictions Weight Bearing Restrictions: No    Mobility  Bed Mobility               General bed mobility comments: in recliner on arrival    Transfers Overall transfer level: Needs assistance Equipment used: Rolling [place] Resor (2 wheeled) Transfers: Sit to/from Stand Sit to Stand: Supervision         General transfer comment: supervision for safety from recliner. Sit to stand x 4 with RW.  Ambulation/Gait             General Gait Details: refused ambulation as patient recently ambulated with OT and preparing to d/c   Stairs             Wheelchair Mobility    Modified Rankin (Stroke Patients Only)       Balance Overall balance assessment: Needs assistance Sitting-balance support: No upper extremity supported;Feet supported Sitting balance-Leahy Scale: Good     Standing balance support: Bilateral upper  extremity supported;During functional activity Standing balance-Leahy Scale: Poor Standing balance comment: Reliant on BUE support                            Cognition Arousal/Alertness: Awake/alert Behavior During Therapy: WFL for tasks assessed/performed Overall Cognitive Status: Impaired/Different from baseline Area of Impairment: Safety/judgement;Awareness;Problem solving;Memory                     Memory: Decreased short-term memory   Safety/Judgement: Decreased awareness of safety;Decreased awareness of deficits Awareness: Emergent Problem Solving: Slow processing;Difficulty sequencing;Requires verbal cues General Comments: requires cues for safety with mobility and use of RW at home.      Exercises General Exercises - Lower Extremity Long Arc Quad: Both;10 reps;Seated Hip Flexion/Marching: Both;10 reps;Standing Mini-Sqauts: 10 reps    General Comments General comments (skin integrity, edema, etc.): Educated on energy conservation, RW use, and fall prevention with spouse and patient      Pertinent Vitals/Pain Pain Assessment: No/denies pain Faces Pain Scale: No hurt Pain Intervention(s): Monitored during session    Home Living                      Prior Function            PT Goals (current goals can now be found in the care plan section) Acute Rehab PT Goals Patient Stated Goal: to go home PT Goal Formulation: With patient Time For Goal Achievement: 09/02/20  Potential to Achieve Goals: Good Progress towards PT goals: Progressing toward goals    Frequency    Min 4X/week      PT Plan Current plan remains appropriate    Co-evaluation              AM-PAC PT "6 Clicks" Mobility   Outcome Measure  Help needed turning from your back to your side while in a flat bed without using bedrails?: A Little Help needed moving from lying on your back to sitting on the side of a flat bed without using bedrails?: A Little Help  needed moving to and from a bed to a chair (including a wheelchair)?: A Little Help needed standing up from a chair using your arms (e.g., wheelchair or bedside chair)?: A Little Help needed to walk in hospital room?: A Little Help needed climbing 3-5 steps with a railing? : A Little 6 Click Score: 18    End of Session   Activity Tolerance: Patient limited by fatigue Patient left: in chair;with call bell/phone within reach;with chair alarm set Nurse Communication: Mobility status PT Visit Diagnosis: Unsteadiness on feet (R26.81);Muscle weakness (generalized) (M62.81)     Time: 2876-8115 PT Time Calculation (min) (ACUTE ONLY): 24 min  Charges:  $Therapeutic Exercise: 8-22 mins $Therapeutic Activity: 8-22 mins                     Devora Tortorella A. Dan Humphreys PT, DPT Acute Rehabilitation Services Pager [phone] Office [phone]    Viviann Spare 08/20/2020, 10:24 AM

## 2020-08-23 DIAGNOSIS — I69354 Hemiplegia and hemiparesis following cerebral infarction affecting left non-dominant side: Secondary | ICD-10-CM | POA: Diagnosis not present

## 2020-08-23 DIAGNOSIS — I2510 Atherosclerotic heart disease of native coronary artery without angina pectoris: Secondary | ICD-10-CM | POA: Diagnosis not present

## 2020-08-23 DIAGNOSIS — I471 Supraventricular tachycardia: Secondary | ICD-10-CM | POA: Diagnosis not present

## 2020-08-23 DIAGNOSIS — I119 Hypertensive heart disease without heart failure: Secondary | ICD-10-CM | POA: Diagnosis not present

## 2020-08-23 DIAGNOSIS — D649 Anemia, unspecified: Secondary | ICD-10-CM | POA: Diagnosis not present

## 2020-08-23 DIAGNOSIS — M5030 Other cervical disc degeneration, unspecified cervical region: Secondary | ICD-10-CM | POA: Diagnosis not present

## 2020-08-23 DIAGNOSIS — E1165 Type 2 diabetes mellitus with hyperglycemia: Secondary | ICD-10-CM | POA: Diagnosis not present

## 2020-08-23 DIAGNOSIS — M5136 Other intervertebral disc degeneration, lumbar region: Secondary | ICD-10-CM | POA: Diagnosis not present

## 2020-08-23 DIAGNOSIS — M4313 Spondylolisthesis, cervicothoracic region: Secondary | ICD-10-CM | POA: Diagnosis not present

## 2020-08-24 ENCOUNTER — Telehealth: Payer: Self-pay

## 2020-08-24 DIAGNOSIS — D649 Anemia, unspecified: Secondary | ICD-10-CM | POA: Diagnosis not present

## 2020-08-24 DIAGNOSIS — E1165 Type 2 diabetes mellitus with hyperglycemia: Secondary | ICD-10-CM | POA: Diagnosis not present

## 2020-08-24 DIAGNOSIS — I2510 Atherosclerotic heart disease of native coronary artery without angina pectoris: Secondary | ICD-10-CM | POA: Diagnosis not present

## 2020-08-24 DIAGNOSIS — M4313 Spondylolisthesis, cervicothoracic region: Secondary | ICD-10-CM | POA: Diagnosis not present

## 2020-08-24 DIAGNOSIS — I119 Hypertensive heart disease without heart failure: Secondary | ICD-10-CM | POA: Diagnosis not present

## 2020-08-24 DIAGNOSIS — M5030 Other cervical disc degeneration, unspecified cervical region: Secondary | ICD-10-CM | POA: Diagnosis not present

## 2020-08-24 DIAGNOSIS — I69354 Hemiplegia and hemiparesis following cerebral infarction affecting left non-dominant side: Secondary | ICD-10-CM | POA: Diagnosis not present

## 2020-08-24 DIAGNOSIS — M5136 Other intervertebral disc degeneration, lumbar region: Secondary | ICD-10-CM | POA: Diagnosis not present

## 2020-08-24 DIAGNOSIS — I471 Supraventricular tachycardia: Secondary | ICD-10-CM | POA: Diagnosis not present

## 2020-08-24 NOTE — Telephone Encounter (Signed)
Marcelino Duster OT with [facility] HH left v/m requesting verbal orders for [facility] OT 1 x a wk for 1 wk; then hold OT for 1 wk; then [facility] OT 1 x a wk for 4 wks.

## 2020-08-25 NOTE — Telephone Encounter (Signed)
Verbal orders given to [facility] for [facility] OT

## 2020-08-25 NOTE — Telephone Encounter (Signed)
Please give the order.  Thanks.   

## 2020-08-26 ENCOUNTER — Ambulatory Visit: Payer: Medicare PPO | Admitting: Physician Assistant

## 2020-08-26 ENCOUNTER — Encounter: Payer: Self-pay | Admitting: Family Medicine

## 2020-08-26 ENCOUNTER — Ambulatory Visit: Payer: Medicare PPO | Admitting: Family Medicine

## 2020-08-26 ENCOUNTER — Other Ambulatory Visit: Payer: Self-pay

## 2020-08-26 ENCOUNTER — Encounter: Payer: Self-pay | Admitting: Physician Assistant

## 2020-08-26 VITALS — BP 128/84 | HR 94 | Ht 65.0 in | Wt 156.6 lb

## 2020-08-26 VITALS — BP 124/84 | HR 101 | Temp 97.6°F | Ht 65.0 in | Wt 155.0 lb

## 2020-08-26 DIAGNOSIS — Z8673 Personal history of transient ischemic attack (TIA), and cerebral infarction without residual deficits: Secondary | ICD-10-CM | POA: Diagnosis not present

## 2020-08-26 DIAGNOSIS — E538 Deficiency of other specified B group vitamins: Secondary | ICD-10-CM | POA: Diagnosis not present

## 2020-08-26 DIAGNOSIS — I471 Supraventricular tachycardia: Secondary | ICD-10-CM

## 2020-08-26 DIAGNOSIS — I639 Cerebral infarction, unspecified: Secondary | ICD-10-CM | POA: Diagnosis not present

## 2020-08-26 DIAGNOSIS — M5417 Radiculopathy, lumbosacral region: Secondary | ICD-10-CM | POA: Diagnosis not present

## 2020-08-26 DIAGNOSIS — I6523 Occlusion and stenosis of bilateral carotid arteries: Secondary | ICD-10-CM

## 2020-08-26 DIAGNOSIS — I2510 Atherosclerotic heart disease of native coronary artery without angina pectoris: Secondary | ICD-10-CM

## 2020-08-26 DIAGNOSIS — E785 Hyperlipidemia, unspecified: Secondary | ICD-10-CM | POA: Diagnosis not present

## 2020-08-26 MED ORDER — CYANOCOBALAMIN 1000 MCG/ML IJ SOLN
1000.0000 ug | Freq: Once | INTRAMUSCULAR | Status: AC
  Administered 2020-08-26: 1000 ug via INTRAMUSCULAR

## 2020-08-26 NOTE — Progress Notes (Signed)
This visit occurred during the SARS-CoV-2 public health emergency.  Safety protocols were in place, including screening questions prior to the visit, additional usage of staff PPE, and extensive cleaning of exam room while observing appropriate contact time as indicated for disinfecting solutions. ======================= Inpatient f/u re: CVA.  See below.  Symptom onset and inpatient course discussed with patient as below.  Assessment and plan:  Acute ischemic stroke:  Neurology/stroke MD consulted and assisted with extensive evaluation and management.  She presented with reports of confusion, apraxia and word finding difficulty. These have resolved without recurrence.  CT head without acute finding. Probable 7 mm hemangioma overlying the right frontal convexity without associated mass-effect.  CTA head and neck: Distal right A2 occlusion.  MRI brain: Small acute on chronic white matter infarct in the right corona radiator, right superior frontal gyrus.  2D echo: LVEF 60-65%.  LDL: 127  A1c: 9.4.  Therapies have evaluated and recommend home health PT and OT, arranged by TOC. Speech therapy evaluation pending.  Not on antithrombotics prior to admission. As per neurology, now on aspirin 325 mg daily + Plavix 75 mg daily for 3 months followed by aspirin 325 mg daily alone given large vessel stenosis/occlusion.  As per neurology, etiology of acute on chronic right ACA small infarct likely due to the right A2 occlusion. However this likely would not explain patient's confusion, apraxia and word finding difficulty. There was still concern for left MCA cortical TIA. Given prior history of atrial tachycardia s/p successful ablation, reported multiple falls, no clear syncope, there was concern that patient may have paroxysmal A. fib or other irregular heartbeat placing her at increased risk of stroke secondary to cardioembolic etiology. Thereby neurology consulted EP cardiology and loop recorder was placed on  2/7. She will follow-up outpatient with cardiology.  Outpatient neurology follow-up. History of atrial tachycardia/paroxysmal SVT, s/p successful ablation  Most recently seen by Dr. [name], Cardiology on 08/16/2020.  Now status post loop recorder placement 4/7 and will follow up with EP cardiology. Hyperlipidemia  Intolerant of statins in the past.  LDL 127, goal <70  Continue prior home medication i.e. Nexlizet  Could consider referring patient to advanced lipid management clinic/Dr. Hilty Type II DM, uncontrolled  A1c 9.4 suggests poor outpatient control. Goal <7.  Continue prior home oral hypoglycemics but recommend close outpatient follow-up with PCP for improved control. Essential hypertension  Presented initially with marked hypertension as noted above.  Blood pressures have gradually and spontaneously improved without intervention.  Close outpatient follow-up and home BP monitoring to consider need for initiation of antihypertensives. Frequent falls  As per cardiology, no syncopal component per spouse, the most recent was months ago.  Therapies evaluated and recommended home health services as above. Hypokalemia/hypomagnesemia  Replaced prior to discharge. Follow labs as outpatient. Anemia, possibly of chronic disease  Stable. Weight loss  Patient/family reported to RN that she has lost substantial amount of weight over the last several months.  Recommend close outpatient follow-up with PCP for further evaluation and management. Pain  Confirmed with patient and spouse that patient takes both OxyIR and tramadol for pain. I advised him that this is dangerous and can cause serious side effects which can include and not limited to worsening mental status changes/confusion, fall risk, constipation etc.  I have clearly stated to them that she should follow-up closely outpatient with her physicians and consider stopping 1 of those medicines at least. They verbalized  understanding. =======================  She had prev speech changes that led  in inpatient tx.   She had trouble with getting nexlizet filled and I asked her to check with cardiology about that.  She has tolerated it prev.    She was concerned about falls with combination of aspirin and plavix.  No bleeding.  D/w pt.  She is considering the combination, risk and benefit d/w pt.    She had loop recorder placed.  Site bandaged, removed bandage, no erythema.  Steri strips still in place.   She attributed some weight loss to pain over the last two years.  Rare use of oxycodone.  Has used advil more than oxycodone.  Tramadol helps a little.  She tries to limit tramadol and oxycodone and doesn't take them together.   Routine cautions given to patient.  DM2 d/w pt.  Sugar recently improved.  She had prev back injection with hyperglycemia after that.  Her sugar may continue to improve, d/w pt. previous A1c discussed with patient.  Due for B12 shot, d/w pt.  Done at OV.    She felt an extra heart beat last night w/o CP.  Self resolved.  Not a run of tachycardia per patient report.    Meds, vitals, and allergies reviewed.   ROS: Per HPI unless specifically indicated in ROS section   GEN: nad, alert and oriented HEENT: ncat NECK: supple w/o LA CV: rrr PULM: ctab, no inc wob ABD: soft, +bs EXT: no edema SKIN: Well-perfused.  30 minutes were devoted to patient care in this encounter (this includes time spent reviewing the patient's file/history, interviewing and examining the patient, counseling/reviewing plan with patient).

## 2020-08-26 NOTE — Progress Notes (Signed)
Cardiology Office Note    Date:  08/26/2020   ID:  Maria [name], DOB 1940/11/25, MRN [id number]  PCP:  Joaquim Nam, MD  Cardiologist:  Charlton Haws, MD  Electrophysiologist:  None   Chief Complaint: hospital requested cardiology follow-up after stroke  History of Present Illness:   Maria [name] is a 80 y.o. female with history of CAD s/p DES to CX 2016, HTN, DM, GERD, HLD (intolerant of statins, hoarse voice with Repatha, myalgias with Praluent), SVT s/p ablation 2016, sinus pause by monitoring 2016 (BB stopped at that time), prior episode of syncope 2020 (hypoglycemia, possible orthostasis/vagal related to back pain), falls, RBBB/LAFB, mild carotid disease (1-39% BICA 2016), recent stroke s/p ILR. Last cath was in 2017 showing 30% prox RCA, 25% D1, patent LCx stent, normal LV function. Last stress test in 01/2018 was low risk, probable normal perfusion and mild soft tissue attenuation (diaphragm), no ischemia or scar. She was recently admitted earlier this month with stroke manifested by confusion, slurred speech and word finding difficulty. Cerebrovascular disease was noted on CTA with distal A2 occlusion. CT head showed no acute finding, 7 mm hemangioma overlying the right frontal convexity without associated mass-effect. MRI small acute on chronic white matter infarct at the right corona radiata, right superior frontal gyrus. 2D Echo 08/19/20 showed EF 60-65%, grade 2 DD, elevated LVEDP, mild aortic sclerosis without stenosis. Loop recorder was placed by EP team. Neurology recommended ASA 325 and Plavix 75 for 3 months and then ASA alone given large vessel stenosis/occlusion.  She is seen back for follow-up today at the request of the discharging team. She has seen her PCP earlier today. She is feeling better from her recent stroke. No chest pain, palpitations, dyspnea. She struggles with chronic peripheral neuropathy, balance difficulty and falls. She walks with a walker. As a result,  she self-discontinued her Plavix out of fear of falling/bleeding. No recurrent syncope. Husband states their son had to come over to review the loop recorder box function and has questions about it. She hasn't taken her Nexlizet in about 2 months due to no insurance coverage for this.   Labwork independently reviewed: 08/2020 K 4.0, Cr 0.77, TSH wnl, Hgb 11.8 (similar to last value), Mg 1.6, LDL 127, A1C 9.4 08/2019 LFTs wnl   Past Medical History:  Diagnosis Date  . Anemia 05/1985  . Carotid artery disease (HCC)    a. 02/2015 - 1-39% bilaterally.  . Chronic back pain   . Concussion   . Coronary artery disease    a. s/p DES to [facility] 01/2015.  . DDD (degenerative disc disease), cervical   . DDD (degenerative disc disease), lumbar   . Fracture of lower leg 07/2001   Right  . GERD (gastroesophageal reflux disease)   . Hyperlipidemia   . Hypertension   . Multinodular thyroid   . Pneumonia ?1990's X 1  . PSVT (paroxysmal supraventricular tachycardia) (HCC)    a. s/p catheter ablation of a parahisian atrial tachycardia which was successfully ablated from the non-coronary cusp of the aortic root 06/2014.  [name] RBBB with left anterior fascicular block   . Renal cyst 07/10/2006   bilateral  . Right knee pain 02/2010   Injected - Dr. Shelle Iron  . Sinus pause    a. By event monitoring 09/2014 - BB stopped at that time.  . Statin intolerance   . Stroke (HCC)   . Syncope and collapse 07/2001   "that's when I broke my leg"  .  Type II diabetes mellitus (HCC) 2002    Past Surgical History:  Procedure Laterality Date  . ANTERIOR CERVICAL DECOMP/DISCECTOMY FUSION  10/03/2005   C4/5; C5/6  "it's got a plate in there"  . ANTERIOR LAT LUMBAR FUSION Left 02/04/2018   Procedure: Lumbar two-three Lumbar three-four Lumbar four-five  Anterolateral decompression/percutaneous posterior arthrodesis, Mazor;  Surgeon: Barnett Abu, MD;  Location: [facility] OR;  Service: Neurosurgery;  Laterality: Left;  . APPLICATION OF  ROBOTIC ASSISTANCE FOR SPINAL PROCEDURE N/A 02/04/2018   Procedure: APPLICATION OF ROBOTIC ASSISTANCE FOR SPINAL PROCEDURE;  Surgeon: Barnett Abu, MD;  Location: [facility] OR;  Service: Neurosurgery;  Laterality: N/A;  . BACK SURGERY    . BREAST BIOPSY Bilateral 1968 (multiple)   "all benign"  . BREAST LUMPECTOMY Bilateral 1968  . CARDIAC CATHETERIZATION N/A 11/18/2014   Procedure: Left Heart Cath and Coronary Angiography;  Surgeon: Peter M Swaziland, MD;  Location: [facility] INVASIVE CV LAB;  Service: Cardiovascular;  Laterality: N/A;  . CARDIAC CATHETERIZATION N/A 01/22/2015   Procedure: Coronary Stent Intervention;  Surgeon: Peter M Swaziland, MD;  Location[facility] INVASIVE CV LAB;  Service: Cardiovascular;  Laterality: N/A;  . CARDIAC CATHETERIZATION N/A 06/10/2015   Procedure: Left Heart Cath and Coronary Angiography;  Surgeon: Peter M Swaziland, MD;  Location: [facility] INVASIVE CV LAB;  Service: Cardiovascular;  Laterality: N/A;  . CATARACT EXTRACTION W/ INTRAOCULAR LENS  IMPLANT, BILATERAL Bilateral 12/2010  . CORONARY ANGIOPLASTY    . ESOPHAGOGASTRODUODENOSCOPY  08/14/2005   gastropathy biopsy, negative  . LAPAROSCOPIC CHOLECYSTECTOMY  08/1992  . LOOP RECORDER INSERTION N/A 08/19/2020   Procedure: LOOP RECORDER INSERTION;  Surgeon: Marinus Maw, MD;  Location: [facility] INVASIVE CV LAB;  Service: Cardiovascular;  Laterality: N/A;  . LUMBAR PERCUTANEOUS PEDICLE SCREW 3 LEVEL N/A 02/04/2018   Procedure: LUMBAR PERCUTANEOUS PEDICLE SCREW LUMBAR TWO - LUMBAR FIVE;  Surgeon: Barnett Abu, MD;  Location: [facility] OR;  Service: Neurosurgery;  Laterality: N/A;  . POSTERIOR LAMINECTOMY / DECOMPRESSION LUMBAR SPINE  09/2001   due to herniated disc  . SUPRAVENTRICULAR TACHYCARDIA ABLATION N/A 07/06/2014   Procedure: SUPRAVENTRICULAR TACHYCARDIA ABLATION;  Surgeon: Marinus Maw, MD;  Location: [facility] CATH LAB;  Service: Cardiovascular;  Laterality: N/A;  . VAGINAL HYSTERECTOMY  1968   spotting    Current Medications: Current Meds  Medication  Sig  . aspirin EC 325 MG EC tablet Take 1 tablet (325 mg total) by mouth daily.  . cyanocobalamin (,VITAMIN B-12,) 1000 MCG/ML injection 1000 mcg injected IM every 14 days.  [name] glyBURIDE (DIABETA) 5 MG tablet Take 1 tablet (5 mg total) by mouth daily.  [name] loperamide (IMODIUM) 2 MG capsule Take 2 mg by mouth 4 (four) times daily as needed for diarrhea or loose stools.   . nitroGLYCERIN (NITROSTAT) 0.4 MG SL tablet PLACE 1 TABLET UNDER THE TONGUE EVERY 5 MINUTES AS NEEDED FOR CHEST PAIN FOR UP TO 3 DOSES  . omeprazole (PRILOSEC) 20 MG capsule Take 1 capsule (20 mg total) by mouth 2 (two) times daily as needed (for acid reflux/indigestion.).  [name] ONETOUCH ULTRA test strip USE AS INSTRUCTED TO TEST BLOOD SUGAR THREE TIMES DAILY.  [name] oxyCODONE (OXY IR/ROXICODONE) 5 MG immediate release tablet Take 5 mg by mouth every 6 (six) hours as needed.  . traMADol (ULTRAM) 50 MG tablet TAKE 1 TABLET BY MOUTH EVERY 6 HOURS AS NEEDED      Allergies:   Crestor [rosuvastatin], Ezetimibe-simvastatin, Pravastatin, Diazepam, Lyrica [pregabalin], Nsaids, Praluent [alirocumab], Repatha [evolocumab], Acetaminophen, Carvedilol, Gabapentin, Ibuprofen, Insulin glargine,  Levemir [insulin detemir], Macrobid [nitrofurantoin macrocrystal], Metformin, Metoprolol tartrate, and Naproxen   Social History   Socioeconomic History  . Marital status: Married    Spouse name: Not on file  . Number of children: 2  . Years of education: Not on file  . Highest education level: Not on file  Occupational History  . 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 Smoking status: Never Smoker  . Smokeless tobacco: Never Used  Vaping Use  . Vaping Use: Never used  Substance and Sexual Activity  . Alcohol use: No    Alcohol/week: 0.0 standard drinks  . Drug use: No  . Sexual activity: Yes  Other Topics Concern  . Not on file  Social History  Narrative   Widowed 2011 after 52 years of marriage (husband had C.diff)   Remarried 2015   Right handed   One story home   Occasional caffeine   Social Determinants of Health   Financial Resource Strain: Not on file  Food Insecurity: Not on file  Transportation Needs: Not on file  Physical Activity: Not on file  Stress: Not on file  Social Connections: Not on file     Family History:  The patient's family history includes Breast cancer in her maternal aunt; Depression in her paternal aunt; Diabetes in her brother, maternal grandmother, paternal grandfather, paternal grandmother, and sister; Heart disease in her maternal grandfather and mother; Hypertension in her maternal grandfather, maternal grandmother, mother, paternal grandfather, and paternal grandmother; Myasthenia gravis in her sister; Stroke in her maternal grandmother. There is no history of Colon cancer.  ROS:   Please see the history of present illness.  All other systems are reviewed and otherwise negative.    EKGs/Labs/Other Studies Reviewed:    Studies reviewed are outlined and summarized above. Reports included below if pertinent.  2D Echo 08/19/20  1. Left ventricular ejection fraction, by estimation, is 60 to 65%. The  left ventricle has normal function. The left ventricle has no regional  wall motion abnormalities. Left ventricular diastolic parameters are  consistent with Grade II diastolic  dysfunction (pseudonormalization). Elevated left ventricular end-diastolic  pressure.  2. Right ventricular systolic function is normal. The right ventricular  size is normal. There is normal pulmonary artery systolic pressure.  3. The mitral valve is abnormal. No evidence of mitral valve  regurgitation. No evidence of mitral stenosis.  4. The aortic valve is tricuspid. There is mild calcification of the  aortic valve. Aortic valve regurgitation is not visualized. Mild aortic  valve sclerosis is present, with no  evidence of aortic valve stenosis.  5. The inferior vena cava is normal in size with greater than 50%  respiratory variability, suggesting right atrial pressure of 3 mmHg.   LHC 05/2015   Prox RCA lesion, 30% stenosed.  1st Diag lesion, 35% stenosed.  The left ventricular systolic function is normal.   1. Nonobstructive CAD. Continued patency of the stent in the LCx. 2. Normal LV function.  Continue medical therapy. Consider switching Brilinta to alternative antiplatelet agent.     EKG:  EKG is not ordered today but reviewed recent tracing from hospital  Recent Labs: 08/19/2020: BUN 9; Creatinine, Ser 0.77; Hemoglobin 11.8; Magnesium 1.6; Platelets 345; Potassium 4.0; Sodium 139; TSH 1.970  Recent Lipid Panel    Component Value Date/Time   CHOL 190 08/19/2020 0606   CHOL 188 03/31/2019 0832  TRIG 85 08/19/2020 0606   HDL 46 08/19/2020 0606   HDL 59 03/31/2019 0832   CHOLHDL 4.1 08/19/2020 0606   VLDL 17 08/19/2020 0606   LDLCALC 127 (H) 08/19/2020 0606   LDLCALC 81 08/14/2019 1414   LDLDIRECT 168.1 02/18/2010 0751    PHYSICAL EXAM:    VS:  BP 128/84   Pulse 94   Ht 5\' 5"  (1.651 m)   Wt 156 lb 9.6 oz (71 kg)   SpO2 96%   BMI 26.06 kg/m   BMI: Body mass index is 26.06 kg/m.  GEN: Well nourished, well developed female in no acute distress HEENT: normocephalic, atraumatic Neck: no JVD, carotid bruits, or masses Cardiac: RRR; no murmurs, rubs, or gallops, no edema  Respiratory:  clear to auscultation bilaterally, normal work of breathing GI: soft, nontender, nondistended, + BS MS: no deformity or atrophy Skin: warm and dry, no rash. Steristrips covering ILR site with small area of dried blood underneath, no fresh oozing noted, no hematoma Neuro:  Alert and Oriented x 3, Strength and sensation are intact, follows commands Psych: euthymic mood, full affect  Wt Readings from Last 3 Encounters:  08/26/20 156 lb 9.6 oz (71 kg)  08/26/20 155 lb (70.3 kg)  08/18/20  150 lb (68 kg)     ASSESSMENT & PLAN:   1. Recent stroke, also history of mild carotid disease - concern for large vessel source or cardiac source by recent hospitalization. She has a loop recorder in place now. It is too early to assess the wound site completely since the Steri-Strips are still in place. She had an appt coming up next week with our EP APP but had to reschedule this as she will be out of town. It is rescheduled for 09/13/20. I had our MA reach out to our EP team today and 11/13/20 indicated they may not need her to have the wound check at all. However, the patient's husband states they think they are having issues with the monitoring box so our MA sent a message back to the EP team to help look into this. Per review with my MA, the EP team will reach out to the patient to help answer their questions about the loop recorder monitoring system as well as her follow-up. Of note, the patient also self-discontinued her Plavix because she does not feel safe taking it with the aspirin due to her history of falls. Since this was recommended by neurology I recommended she call their office to discuss ASAP in case they would like her to reconsider a different regimen. We provided their number as well. The EP team will need to keep this in mind if she does in fact have afib see on her ILR in the future.  2. CAD with HLD goal LDL<70 - no recent angina or dyspnea. Continue ASA as tolerated. She hasn't taken her Nexlizet in about 2 months due to no insurance coverage for this. I will have our MA reach out to the pharmacy team to help review options for coverage. No LFTs on file since 08/2019, will also update in the event that we are able to restart Nexlizet.  3. History of SVT - quiescent. Will be monitored by ILR now.   4. Hypomagnesemia - recheck Mg today with BMET given hypokalemia and hypomagnesemia in the hospital.  Disposition: F/u with Dr. 09/2019 as previously suggested in 08/2021. I did also  stress the importance of following up with neurology as well since her recent admission  was for neurologic reasons. Provided phone number for neurology office.  Medication Adjustments/Labs and Tests Ordered: Current medicines are reviewed at length with the patient today.  Concerns regarding medicines are outlined above. Medication changes, Labs and Tests ordered today are summarized above and listed in the Patient Instructions accessible in Encounters.   Signed, Laurann Montana, PA-C  08/26/2020 3:59 PM    [facility] [address] [place], [place], [place]  [address] Phone: [phone]; Fax: [phone]

## 2020-08-26 NOTE — Patient Instructions (Signed)
Medication Instructions:  Your physician recommends that you continue on your current medications as directed. Please refer to the Current Medication list given to you today.  *If you need a refill on your cardiac medications before your next appointment, please call your pharmacy*   Lab Work: TODAY:  BMET, MAG, & LFT  If you have labs (blood work) drawn today and your tests are completely normal, you will receive your results only by: [name] MyChart Message (if you have MyChart) OR . A paper copy in the mail If you have any lab test that is abnormal or we need to change your treatment, we will call you to review the results.   Testing/Procedures: Your physician has requested that you have a carotid duplex. This test is an ultrasound of the carotid arteries in your neck. It looks at blood flow through these arteries that supply the brain with blood. Allow one hour for this exam. There are no restrictions or special instructions.     Follow-Up: At [facility], you and your health needs are our priority.  As part of our continuing mission to provide you with exceptional heart care, we have created designated Provider Care Teams.  These Care Teams include your primary Cardiologist (physician) and Advanced Practice Providers (APPs -  Physician Assistants and Nurse Practitioners) who all work together to provide you with the care you need, when you need it.  We recommend signing up for the patient portal called "MyChart".  Sign up information is provided on this After Visit Summary.  MyChart is used to connect with patients for Virtual Visits (Telemedicine).  Patients are able to view lab/test results, encounter notes, upcoming appointments, etc.  Non-urgent messages can be sent to your provider as well.   To learn more about what you can do with MyChart, go to [email or web address].    Your next appointment:   12 month(s)  The format for your next appointment:   In Person  Provider:    You may see Charlton Haws, MD or one of the following Advanced Practice Providers on your designated Care Team:    Georgie Chard, NP    Other Instructions [facility] [phone]

## 2020-08-26 NOTE — Patient Instructions (Addendum)
Please ask cardiology about getting nexlizet filled.   Plan on recheck in about 3 months with A1c at the visit.   If your sugar isn't improving in the meantime, then let me know.  Take care.  Glad to see you. Don't take oxycodone and tramadol together.   B12 shot today.  I'll await the cardiology notes.

## 2020-08-27 ENCOUNTER — Telehealth: Payer: Self-pay

## 2020-08-27 ENCOUNTER — Other Ambulatory Visit: Payer: Self-pay | Admitting: Pharmacist

## 2020-08-27 LAB — HEPATIC FUNCTION PANEL
ALT: 12 IU/L (ref 0–32)
AST: 11 IU/L (ref 0–40)
Albumin: 4.3 g/dL (ref 3.6–4.6)
Alkaline Phosphatase: 100 IU/L (ref 44–121)
Bilirubin Total: 0.4 mg/dL (ref 0.0–1.2)
Bilirubin, Direct: 0.14 mg/dL (ref 0.00–0.40)
Total Protein: 6.6 g/dL (ref 6.0–8.5)

## 2020-08-27 LAB — BASIC METABOLIC PANEL
BUN/Creatinine Ratio: 14 (ref 12–28)
BUN: 14 mg/dL (ref 8–27)
CO2: 24 mmol/L (ref 20–29)
Calcium: 10.1 mg/dL (ref 8.7–10.3)
Chloride: 102 mmol/L (ref 96–106)
Creatinine, Ser: 0.99 mg/dL (ref 0.57–1.00)
Glucose: 212 mg/dL — ABNORMAL HIGH (ref 65–99)
Potassium: 4.1 mmol/L (ref 3.5–5.2)
Sodium: 143 mmol/L (ref 134–144)
eGFR: 57 mL/min/{1.73_m2} — ABNORMAL LOW (ref >59)

## 2020-08-27 LAB — MAGNESIUM: Magnesium: 1.8 mg/dL (ref 1.6–2.3)

## 2020-08-27 MED ORDER — NEXLIZET 180-10 MG PO TABS: 1.0000 | ORAL_TABLET | Freq: Every day | ORAL | 3 refills | Status: DC

## 2020-08-27 NOTE — Telephone Encounter (Signed)
-----   Message from Awilda Metro, RPH-CPP sent at 08/26/2020  4:29 PM EDT ----- Regarding: FW: NEXILET Are you able to help with prior auth renewal if needed/healthwell grant if needed? Thank you! ----- Message ----- From: Elliot Cousin, RMA Sent: 08/26/2020   4:22 PM EDT To: [facility] Subject: NEXILET                                        Per Dayna, pt reports insurance stopped paying for medication.  Can someone check and see if there is anything to get it approved or what she will need to do?  Thanks!

## 2020-08-27 NOTE — Telephone Encounter (Signed)
lmomed that they were approved for nexlizet and to call us back if they need a refill or assistance w/cost

## 2020-08-27 NOTE — Progress Notes (Signed)
Prior authorization for Nexlizet has been approved through 05/14/21.

## 2020-08-29 NOTE — Assessment & Plan Note (Signed)
B12 shot done at office visit. 

## 2020-08-29 NOTE — Assessment & Plan Note (Signed)
Not sedated on medication.  She does not take oxycodone and tramadol concurrently.  Rare use of oxycodone.  Routine cautions given to patient.

## 2020-08-29 NOTE — Assessment & Plan Note (Signed)
History of.  No new symptoms at this point.  Routine cautions given to patient.  She is hesitant to take aspirin and Plavix concurrently.  Discussed.  She is considering the combination at this point.  We expect her A1c to improve.  Previous hyperglycemia was attributed to back injection.  She had hyperglycemia with that but her sugars have improved in the meantime.  She is intolerant of statins but will check with cardiology about getting nexlizet filled.  She has cardiology follow-up pending.  See above.

## 2020-08-30 ENCOUNTER — Telehealth: Payer: Self-pay

## 2020-08-30 NOTE — Telephone Encounter (Signed)
-----   Message from Wiliam Ke, RN sent at 08/30/2020  1:28 PM EDT ----- Regarding: FW: Loop Recorder Pt rescheduled her loop "wound check" for 5/2.  That puts her about 1 month from hospital discharge. Does she still need to keep the 5/2 appt?  Boneta Lucks  ----- Message ----- From: Elliot Cousin, RMA Sent: 08/26/2020   4:10 PM EDT To: Wiliam Ke, RN Subject: Loop Recorder                                  Does pt need to keep wound check 5/2?  Also pt and husband want to make sure the "box" is communicating correctly, has his son came by and did the configuration for them this past weekend  Thanks so much!

## 2020-08-30 NOTE — Telephone Encounter (Signed)
Pt returned phone call.  Advised the ILR is transmitting and we are able to access reports on [facility].    Pt indicates steri-strip is still in place, no concerns with wound.  She will keep appt on 5/2 when she returns from her trip.

## 2020-08-30 NOTE — Telephone Encounter (Signed)
Attempted to reach pt to answer her questions regarding ILR and wound check appt.  No answer, LVM with device @ and hours. (DPR on file)  Verified pt is transmitting via Carelink, next summary report due 09/22/20.  AS far as wound check, we will ned to discuss site appearance and maybe change visit to Virtual visit if pt wishes and is able to send picture of site.

## 2020-08-31 ENCOUNTER — Encounter: Payer: Medicare PPO | Admitting: Physician Assistant

## 2020-08-31 NOTE — Telephone Encounter (Signed)
Called and spoke w/pt regarding is there a need for pt assistance and the pt stated that she makes 90,000 yearly and that is above the cut off for the healthwell foundation

## 2020-09-04 ENCOUNTER — Encounter (HOSPITAL_COMMUNITY): Payer: Self-pay | Admitting: Internal Medicine

## 2020-09-06 ENCOUNTER — Telehealth: Payer: Self-pay

## 2020-09-06 DIAGNOSIS — M5136 Other intervertebral disc degeneration, lumbar region: Secondary | ICD-10-CM | POA: Diagnosis not present

## 2020-09-06 DIAGNOSIS — M4313 Spondylolisthesis, cervicothoracic region: Secondary | ICD-10-CM | POA: Diagnosis not present

## 2020-09-06 DIAGNOSIS — I2510 Atherosclerotic heart disease of native coronary artery without angina pectoris: Secondary | ICD-10-CM | POA: Diagnosis not present

## 2020-09-06 DIAGNOSIS — M5417 Radiculopathy, lumbosacral region: Secondary | ICD-10-CM

## 2020-09-06 DIAGNOSIS — E1165 Type 2 diabetes mellitus with hyperglycemia: Secondary | ICD-10-CM | POA: Diagnosis not present

## 2020-09-06 DIAGNOSIS — D649 Anemia, unspecified: Secondary | ICD-10-CM | POA: Diagnosis not present

## 2020-09-06 DIAGNOSIS — I471 Supraventricular tachycardia: Secondary | ICD-10-CM | POA: Diagnosis not present

## 2020-09-06 DIAGNOSIS — M5030 Other cervical disc degeneration, unspecified cervical region: Secondary | ICD-10-CM | POA: Diagnosis not present

## 2020-09-06 DIAGNOSIS — I69354 Hemiplegia and hemiparesis following cerebral infarction affecting left non-dominant side: Secondary | ICD-10-CM | POA: Diagnosis not present

## 2020-09-06 DIAGNOSIS — I119 Hypertensive heart disease without heart failure: Secondary | ICD-10-CM | POA: Diagnosis not present

## 2020-09-06 NOTE — Telephone Encounter (Signed)
Tresa Endo PT with [facility] left v/m requesting written order for a four wheel walker with the large wheels faxed to [employer] or the DME of doctors choice. Kelly request cb when completed.

## 2020-09-07 ENCOUNTER — Ambulatory Visit (HOSPITAL_COMMUNITY)
Admission: RE | Admit: 2020-09-07 | Discharge: 2020-09-07 | Disposition: A | Discharge status: Home / Self Care | Payer: Medicare PPO | Source: Ambulatory Visit | Attending: Cardiovascular Disease | Admitting: Cardiovascular Disease

## 2020-09-07 ENCOUNTER — Other Ambulatory Visit: Payer: Self-pay

## 2020-09-07 DIAGNOSIS — I6523 Occlusion and stenosis of bilateral carotid arteries: Secondary | ICD-10-CM

## 2020-09-07 DIAGNOSIS — Z8673 Personal history of transient ischemic attack (TIA), and cerebral infarction without residual deficits: Secondary | ICD-10-CM

## 2020-09-07 DIAGNOSIS — I2510 Atherosclerotic heart disease of native coronary artery without angina pectoris: Secondary | ICD-10-CM

## 2020-09-07 DIAGNOSIS — E785 Hyperlipidemia, unspecified: Secondary | ICD-10-CM

## 2020-09-07 DIAGNOSIS — I471 Supraventricular tachycardia: Secondary | ICD-10-CM | POA: Diagnosis not present

## 2020-09-07 NOTE — Telephone Encounter (Signed)
Patient okay with this and DME order placed and faxed to [employer]. Called Kelly and LMTCB.

## 2020-09-07 NOTE — Telephone Encounter (Signed)
Make sure patient agrees and then send letter, dx M54.17.  Thanks.

## 2020-09-08 ENCOUNTER — Telehealth: Payer: Self-pay | Admitting: Physician Assistant

## 2020-09-08 NOTE — Telephone Encounter (Signed)
?  Pt is returning call to get carotid result ?

## 2020-09-08 NOTE — Telephone Encounter (Signed)
Called patient reviewed results of carotid US.  She verbalized understanding.  All questions answered.

## 2020-09-09 NOTE — Telephone Encounter (Signed)
Tresa Endo with [facility] is aware DME order is done and has been faxed to [employer]

## 2020-09-10 DIAGNOSIS — M5136 Other intervertebral disc degeneration, lumbar region: Secondary | ICD-10-CM | POA: Diagnosis not present

## 2020-09-10 DIAGNOSIS — M4313 Spondylolisthesis, cervicothoracic region: Secondary | ICD-10-CM | POA: Diagnosis not present

## 2020-09-10 DIAGNOSIS — I2510 Atherosclerotic heart disease of native coronary artery without angina pectoris: Secondary | ICD-10-CM | POA: Diagnosis not present

## 2020-09-10 DIAGNOSIS — I471 Supraventricular tachycardia: Secondary | ICD-10-CM | POA: Diagnosis not present

## 2020-09-10 DIAGNOSIS — E1165 Type 2 diabetes mellitus with hyperglycemia: Secondary | ICD-10-CM | POA: Diagnosis not present

## 2020-09-10 DIAGNOSIS — I119 Hypertensive heart disease without heart failure: Secondary | ICD-10-CM | POA: Diagnosis not present

## 2020-09-10 DIAGNOSIS — I69354 Hemiplegia and hemiparesis following cerebral infarction affecting left non-dominant side: Secondary | ICD-10-CM | POA: Diagnosis not present

## 2020-09-10 DIAGNOSIS — M5030 Other cervical disc degeneration, unspecified cervical region: Secondary | ICD-10-CM | POA: Diagnosis not present

## 2020-09-10 DIAGNOSIS — D649 Anemia, unspecified: Secondary | ICD-10-CM | POA: Diagnosis not present

## 2020-09-12 NOTE — Progress Notes (Signed)
Cardiology Office Note Date:  09/12/2020  Patient ID:  Maria, [name] Mar 16, 1941, MRN [id number] PCP:  Joaquim Nam, MD  Cardiologist:  Dr. [name] Electrophysiologist: Dr. Ladona Ridgel    Chief Complaint: wound check   History of Present Illness: Maria [name] is a 80 y.o. female with history of CAD (PCI 2016), HTN, HLD, DM, SVT, falls, peripheral neuropathy, stroke > loop.  She was  admitted on 08/18/2020 with AMS and aphasia, found with cryptogenic stroke and had loop implanted.  TODAY She is doing well No recurrent neuro symptoms No cardiac complaints or concerns, nothing new since her visit with D. Dunn, PA post hospital visit She has some minimal pedal edema that she says is somewhat chronic always some on the left, now some on the right as well No SOB  She has her loop connected via her phone APP, son helped with this and had their questions answered via out device clinic team   Device information MDT linq II, implanted 08/19/20, cryptogenic stroke   Past Medical History:  Diagnosis Date  . Anemia 05/1985  . Carotid artery disease (HCC)    a. 02/2015 - 1-39% bilaterally.  . Chronic back pain   . Concussion   . Coronary artery disease    a. s/p DES to [facility] 01/2015.  . DDD (degenerative disc disease), cervical   . DDD (degenerative disc disease), lumbar   . Fracture of lower leg 07/2001   Right  . GERD (gastroesophageal reflux disease)   . Hyperlipidemia   . Hypertension   . Multinodular thyroid   . Pneumonia ?1990's X 1  . PSVT (paroxysmal supraventricular tachycardia) (HCC)    a. s/p catheter ablation of a parahisian atrial tachycardia which was successfully ablated from the non-coronary cusp of the aortic root 06/2014.  [name] RBBB with left anterior fascicular block   . Renal cyst 07/10/2006   bilateral  . Right knee pain 02/2010   Injected - Dr. Shelle Iron  . Sinus pause    a. By event monitoring 09/2014 - BB stopped at that time.  . Statin intolerance   . Stroke  (HCC)   . Syncope and collapse 07/2001   "that's when I broke my leg"  . Type II diabetes mellitus (HCC) 2002    Past Surgical History:  Procedure Laterality Date  . ANTERIOR CERVICAL DECOMP/DISCECTOMY FUSION  10/03/2005   C4/5; C5/6  "it's got a plate in there"  . ANTERIOR LAT LUMBAR FUSION Left 02/04/2018   Procedure: Lumbar two-three Lumbar three-four Lumbar four-five  Anterolateral decompression/percutaneous posterior arthrodesis, Mazor;  Surgeon: Barnett Abu, MD;  Location: [facility] OR;  Service: Neurosurgery;  Laterality: Left;  . APPLICATION OF ROBOTIC ASSISTANCE FOR SPINAL PROCEDURE N/A 02/04/2018   Procedure: APPLICATION OF ROBOTIC ASSISTANCE FOR SPINAL PROCEDURE;  Surgeon: Barnett Abu, MD;  Location: [facility] OR;  Service: Neurosurgery;  Laterality: N/A;  . BACK SURGERY    . BREAST BIOPSY Bilateral 1968 (multiple)   "all benign"  . BREAST LUMPECTOMY Bilateral 1968  . CARDIAC CATHETERIZATION N/A 11/18/2014   Procedure: Left Heart Cath and Coronary Angiography;  Surgeon: Peter M Swaziland, MD;  Location: [facility] INVASIVE CV LAB;  Service: Cardiovascular;  Laterality: N/A;  . CARDIAC CATHETERIZATION N/A 01/22/2015   Procedure: Coronary Stent Intervention;  Surgeon: Peter M Swaziland, MD;  Location: [facility] INVASIVE CV LAB;  Service: Cardiovascular;  Laterality: N/A;  . CARDIAC CATHETERIZATION N/A 06/10/2015   Procedure: Left Heart Cath and Coronary Angiography;  Surgeon: Peter M Swaziland, MD;  Location: [facility] INVASIVE CV LAB;  Service: Cardiovascular;  Laterality: N/A;  . CATARACT EXTRACTION W/ INTRAOCULAR LENS  IMPLANT, BILATERAL Bilateral 12/2010  . CORONARY ANGIOPLASTY    . ESOPHAGOGASTRODUODENOSCOPY  08/14/2005   gastropathy biopsy, negative  . LAPAROSCOPIC CHOLECYSTECTOMY  08/1992  . LOOP RECORDER INSERTION N/A 08/19/2020   Procedure: LOOP RECORDER INSERTION;  Surgeon: Marinus Maw, MD;  Location: [facility] INVASIVE CV LAB;  Service: Cardiovascular;  Laterality: N/A;  . LUMBAR PERCUTANEOUS PEDICLE SCREW 3 LEVEL N/A  02/04/2018   Procedure: LUMBAR PERCUTANEOUS PEDICLE SCREW LUMBAR TWO - LUMBAR FIVE;  Surgeon: Barnett Abu, MD;  Location: [facility] OR;  Service: Neurosurgery;  Laterality: N/A;  . POSTERIOR LAMINECTOMY / DECOMPRESSION LUMBAR SPINE  09/2001   due to herniated disc  . SUPRAVENTRICULAR TACHYCARDIA ABLATION N/A 07/06/2014   Procedure: SUPRAVENTRICULAR TACHYCARDIA ABLATION;  Surgeon: Marinus Maw, MD;  Location: [facility] CATH LAB;  Service: Cardiovascular;  Laterality: N/A;  . VAGINAL HYSTERECTOMY  1968   spotting    Current Outpatient Medications  Medication Sig Dispense Refill  . aspirin EC 325 MG EC tablet Take 1 tablet (325 mg total) by mouth daily. 30 tablet 1  . Bempedoic Acid-Ezetimibe (NEXLIZET) 180-10 MG TABS Take 1 tablet by mouth daily. 90 tablet 3  . clopidogrel (PLAVIX) 75 MG tablet Take 1 tablet (75 mg total) by mouth daily. (Patient not taking: No sig reported) 90 tablet 0  . cyanocobalamin (,VITAMIN B-12,) 1000 MCG/ML injection 1000 mcg injected IM every 14 days.    . feeding supplement (ENSURE ENLIVE / ENSURE PLUS) LIQD Take 237 mLs by mouth 2 (two) times daily between meals. (Patient not taking: Reported on 08/26/2020) 237 mL 12  . glyBURIDE (DIABETA) 5 MG tablet Take 1 tablet (5 mg total) by mouth daily.    [name] loperamide (IMODIUM) 2 MG capsule Take 2 mg by mouth 4 (four) times daily as needed for diarrhea or loose stools.     . nitroGLYCERIN (NITROSTAT) 0.4 MG SL tablet PLACE 1 TABLET UNDER THE TONGUE EVERY 5 MINUTES AS NEEDED FOR CHEST PAIN FOR UP TO 3 DOSES 25 tablet 5  . omeprazole (PRILOSEC) 20 MG capsule Take 1 capsule (20 mg total) by mouth 2 (two) times daily as needed (for acid reflux/indigestion.).    [name] ONETOUCH ULTRA test strip USE AS INSTRUCTED TO TEST BLOOD SUGAR THREE TIMES DAILY. 100 strip 3  . oxyCODONE (OXY IR/ROXICODONE) 5 MG immediate release tablet Take 5 mg by mouth every 6 (six) hours as needed.    . traMADol (ULTRAM) 50 MG tablet TAKE 1 TABLET BY MOUTH EVERY 6 HOURS AS  NEEDED 60 tablet 2   No current facility-administered medications for this visit.    Allergies:   Crestor [rosuvastatin], Ezetimibe-simvastatin, Pravastatin, Diazepam, Lyrica [pregabalin], Nsaids, Praluent [alirocumab], Repatha [evolocumab], Acetaminophen, Carvedilol, Gabapentin, Ibuprofen, Insulin glargine, Levemir [insulin detemir], Macrobid [nitrofurantoin macrocrystal], Metformin, Metoprolol tartrate, and Naproxen   Social History:  The patient  reports that she has never smoked. She has never used smokeless tobacco. She reports that she does not drink alcohol and does not use drugs.   Family History:  The patient's family history includes Breast cancer in her maternal aunt; Depression in her paternal aunt; Diabetes in her brother, maternal grandmother, paternal grandfather, paternal grandmother, and sister; Heart disease in her maternal grandfather and mother; Hypertension in her maternal grandfather, maternal grandmother, mother, paternal grandfather, and paternal grandmother; Myasthenia gravis in her sister; Stroke in her maternal grandmother.  ROS:  Please see the history  of present illness.    All other systems are reviewed and otherwise negative.   PHYSICAL EXAM:  VS:  There were no vitals taken for this visit. BMI: There is no height or weight on file to calculate BMI. Well nourished, well developed, in no acute distress HEENT: normocephalic, atraumatic Neck: no JVD, carotid bruits or masses Cardiac:  RRR; no significant murmurs, no rubs, or gallops Lungs:  CTA b/l, no wheezing, rhonchi or rales Abd: soft, nontender MS: no deformity or atrophy Ext: trace LLE edema, none appreciated on the right Skin: warm and dry, no rash Neuro:  No gross deficits appreciated Psych: euthymic mood, full affect  ILR site: steri strips are removed without difficulty.  Wound is well healed, stable, no tethering or discomfort.  No erythema, edema, no signs of infection   EKG:  Not done  today  Device interrogation done today and reviewed by myself:  Battery is good R waves 0.23mV No arrhythmias or device observations   08/19/2020: TTE IMPRESSIONS  1. Left ventricular ejection fraction, by estimation, is 60 to 65%. The  left ventricle has normal function. The left ventricle has no regional  wall motion abnormalities. Left ventricular diastolic parameters are  consistent with Grade II diastolic  dysfunction (pseudonormalization). Elevated left ventricular end-diastolic  pressure.  2. Right ventricular systolic function is normal. The right ventricular  size is normal. There is normal pulmonary artery systolic pressure.  3. The mitral valve is abnormal. No evidence of mitral valve  regurgitation. No evidence of mitral stenosis.  4. The aortic valve is tricuspid. There is mild calcification of the  aortic valve. Aortic valve regurgitation is not visualized. Mild aortic  valve sclerosis is present, with no evidence of aortic valve stenosis.  5. The inferior vena cava is normal in size with greater than 50%  respiratory variability, suggesting right atrial pressure of 3 mmHg.    01/21/2018: stress myoview  Nuclear stress EF: 75%.  Probable normal perfusion and mild soft tissue attenuation (diaphragm) No ischemia or scar.  This is a low risk study.   06/10/2015: LHC  Prox RCA lesion, 30% stenosed.  1st Diag lesion, 35% stenosed.  The left ventricular systolic function is normal.   1. Nonobstructive CAD. Continued patency of the stent in the LCx. 2. Normal LV function.  Continue medical therapy. Consider switching Brilinta to alternative antiplatelet agent.    Recent Labs: 08/19/2020: Hemoglobin 11.8; Platelets 345; TSH 1.970 08/26/2020: ALT 12; BUN 14; Creatinine, Ser 0.99; Magnesium 1.8; Potassium 4.1; Sodium 143  08/19/2020: Cholesterol 190; HDL 46; LDL Cholesterol 127; Total CHOL/HDL Ratio 4.1; Triglycerides 85; VLDL 17   CrCl cannot be calculated  (Unknown ideal weight.).   Wt Readings from Last 3 Encounters:  08/26/20 156 lb 9.6 oz (71 kg)  08/26/20 155 lb (70.3 kg)  08/18/20 150 lb (68 kg)     Other studies reviewed: Additional studies/records reviewed today include: summarized above  ASSESSMENT AND PLAN:  1. Cryptogenic stroke >> Loop implant     Well healed     No AFib to date  2. CAD     No ischemic symptoms     Management with Dr. Glena Norfolk  3. HTN     Looks good, no changes  Disposition: F/u with remotes as usual and in clinic with EP as needed.  Current medicines are reviewed at length with the patient today.  The patient did not have any concerns regarding medicines.  Maria Fredrickson, PA-C 09/12/2020 8:50 AM  [address]  [place] [address] [phone] (office)  [phone] (fax)

## 2020-09-13 ENCOUNTER — Encounter: Payer: Self-pay | Admitting: Physician Assistant

## 2020-09-13 ENCOUNTER — Other Ambulatory Visit: Payer: Self-pay

## 2020-09-13 ENCOUNTER — Ambulatory Visit: Payer: Medicare PPO | Admitting: Physician Assistant

## 2020-09-13 VITALS — BP 130/70 | HR 98 | Ht 65.0 in | Wt 157.2 lb

## 2020-09-13 DIAGNOSIS — I10 Essential (primary) hypertension: Secondary | ICD-10-CM | POA: Diagnosis not present

## 2020-09-13 DIAGNOSIS — Z5189 Encounter for other specified aftercare: Secondary | ICD-10-CM | POA: Diagnosis not present

## 2020-09-13 DIAGNOSIS — Z4509 Encounter for adjustment and management of other cardiac device: Secondary | ICD-10-CM

## 2020-09-13 DIAGNOSIS — I2510 Atherosclerotic heart disease of native coronary artery without angina pectoris: Secondary | ICD-10-CM | POA: Diagnosis not present

## 2020-09-13 LAB — CUP PACEART INCLINIC DEVICE CHECK
Date Time Interrogation Session: 20220502160903
Implantable Pulse Generator Implant Date: 20220407

## 2020-09-13 NOTE — Patient Instructions (Signed)
Medication Instructions:   Your physician recommends that you continue on your current medications as directed. Please refer to the Current Medication list given to you today.  *If you need a refill on your cardiac medications before your next appointment, please call your pharmacy*   Lab Work: NONE ORDERED  TODAY   If you have labs (blood work) drawn today and your tests are completely normal, you will receive your results only by: [name] MyChart Message (if you have MyChart) OR . A paper copy in the mail If you have any lab test that is abnormal or we need to change your treatment, we will call you to review the results.   Testing/Procedures: NONE ORDERED  TODAY     Follow-Up: At [facility], you and your health needs are our priority.  As part of our continuing mission to provide you with exceptional heart care, we have created designated Provider Care Teams.  These Care Teams include your primary Cardiologist (physician) and Advanced Practice Providers (APPs -  Physician Assistants and Nurse Practitioners) who all work together to provide you with the care you need, when you need it.  We recommend signing up for the patient portal called "MyChart".  Sign up information is provided on this After Visit Summary.  MyChart is used to connect with patients for Virtual Visits (Telemedicine).  Patients are able to view lab/test results, encounter notes, upcoming appointments, etc.  Non-urgent messages can be sent to your provider as well.   To learn more about what you can do with MyChart, go to [email or web address].      CONTACT [facility] [phone] AS NEEDED FOR  ANY CARDIAC RELATED SYMPTOMS

## 2020-09-14 DIAGNOSIS — E1165 Type 2 diabetes mellitus with hyperglycemia: Secondary | ICD-10-CM | POA: Diagnosis not present

## 2020-09-14 DIAGNOSIS — M5136 Other intervertebral disc degeneration, lumbar region: Secondary | ICD-10-CM | POA: Diagnosis not present

## 2020-09-14 DIAGNOSIS — I119 Hypertensive heart disease without heart failure: Secondary | ICD-10-CM | POA: Diagnosis not present

## 2020-09-14 DIAGNOSIS — I69354 Hemiplegia and hemiparesis following cerebral infarction affecting left non-dominant side: Secondary | ICD-10-CM | POA: Diagnosis not present

## 2020-09-14 DIAGNOSIS — I2510 Atherosclerotic heart disease of native coronary artery without angina pectoris: Secondary | ICD-10-CM | POA: Diagnosis not present

## 2020-09-14 DIAGNOSIS — I471 Supraventricular tachycardia: Secondary | ICD-10-CM | POA: Diagnosis not present

## 2020-09-14 DIAGNOSIS — D649 Anemia, unspecified: Secondary | ICD-10-CM | POA: Diagnosis not present

## 2020-09-14 DIAGNOSIS — M5030 Other cervical disc degeneration, unspecified cervical region: Secondary | ICD-10-CM | POA: Diagnosis not present

## 2020-09-14 DIAGNOSIS — M4313 Spondylolisthesis, cervicothoracic region: Secondary | ICD-10-CM | POA: Diagnosis not present

## 2020-09-16 DIAGNOSIS — M5030 Other cervical disc degeneration, unspecified cervical region: Secondary | ICD-10-CM | POA: Diagnosis not present

## 2020-09-16 DIAGNOSIS — I119 Hypertensive heart disease without heart failure: Secondary | ICD-10-CM | POA: Diagnosis not present

## 2020-09-16 DIAGNOSIS — I69354 Hemiplegia and hemiparesis following cerebral infarction affecting left non-dominant side: Secondary | ICD-10-CM | POA: Diagnosis not present

## 2020-09-16 DIAGNOSIS — I471 Supraventricular tachycardia: Secondary | ICD-10-CM | POA: Diagnosis not present

## 2020-09-16 DIAGNOSIS — I2510 Atherosclerotic heart disease of native coronary artery without angina pectoris: Secondary | ICD-10-CM | POA: Diagnosis not present

## 2020-09-16 DIAGNOSIS — M5136 Other intervertebral disc degeneration, lumbar region: Secondary | ICD-10-CM | POA: Diagnosis not present

## 2020-09-16 DIAGNOSIS — D649 Anemia, unspecified: Secondary | ICD-10-CM | POA: Diagnosis not present

## 2020-09-16 DIAGNOSIS — E1165 Type 2 diabetes mellitus with hyperglycemia: Secondary | ICD-10-CM | POA: Diagnosis not present

## 2020-09-16 DIAGNOSIS — M4313 Spondylolisthesis, cervicothoracic region: Secondary | ICD-10-CM | POA: Diagnosis not present

## 2020-09-22 ENCOUNTER — Ambulatory Visit (INDEPENDENT_AMBULATORY_CARE_PROVIDER_SITE_OTHER): Payer: Medicare PPO

## 2020-09-22 DIAGNOSIS — I639 Cerebral infarction, unspecified: Secondary | ICD-10-CM | POA: Diagnosis not present

## 2020-09-23 LAB — CUP PACEART REMOTE DEVICE CHECK
Date Time Interrogation Session: 20220511103828
Implantable Pulse Generator Implant Date: 20220407

## 2020-09-27 DIAGNOSIS — E1165 Type 2 diabetes mellitus with hyperglycemia: Secondary | ICD-10-CM | POA: Diagnosis not present

## 2020-09-27 DIAGNOSIS — I2510 Atherosclerotic heart disease of native coronary artery without angina pectoris: Secondary | ICD-10-CM | POA: Diagnosis not present

## 2020-09-27 DIAGNOSIS — M4313 Spondylolisthesis, cervicothoracic region: Secondary | ICD-10-CM | POA: Diagnosis not present

## 2020-09-27 DIAGNOSIS — M5030 Other cervical disc degeneration, unspecified cervical region: Secondary | ICD-10-CM | POA: Diagnosis not present

## 2020-09-27 DIAGNOSIS — M5136 Other intervertebral disc degeneration, lumbar region: Secondary | ICD-10-CM | POA: Diagnosis not present

## 2020-09-27 DIAGNOSIS — I69354 Hemiplegia and hemiparesis following cerebral infarction affecting left non-dominant side: Secondary | ICD-10-CM | POA: Diagnosis not present

## 2020-09-27 DIAGNOSIS — I119 Hypertensive heart disease without heart failure: Secondary | ICD-10-CM | POA: Diagnosis not present

## 2020-09-27 DIAGNOSIS — I471 Supraventricular tachycardia: Secondary | ICD-10-CM | POA: Diagnosis not present

## 2020-09-27 DIAGNOSIS — D649 Anemia, unspecified: Secondary | ICD-10-CM | POA: Diagnosis not present

## 2020-10-04 DIAGNOSIS — M5030 Other cervical disc degeneration, unspecified cervical region: Secondary | ICD-10-CM | POA: Diagnosis not present

## 2020-10-04 DIAGNOSIS — E1165 Type 2 diabetes mellitus with hyperglycemia: Secondary | ICD-10-CM | POA: Diagnosis not present

## 2020-10-04 DIAGNOSIS — I2510 Atherosclerotic heart disease of native coronary artery without angina pectoris: Secondary | ICD-10-CM | POA: Diagnosis not present

## 2020-10-04 DIAGNOSIS — M4313 Spondylolisthesis, cervicothoracic region: Secondary | ICD-10-CM | POA: Diagnosis not present

## 2020-10-04 DIAGNOSIS — D649 Anemia, unspecified: Secondary | ICD-10-CM | POA: Diagnosis not present

## 2020-10-04 DIAGNOSIS — I119 Hypertensive heart disease without heart failure: Secondary | ICD-10-CM | POA: Diagnosis not present

## 2020-10-04 DIAGNOSIS — I471 Supraventricular tachycardia: Secondary | ICD-10-CM | POA: Diagnosis not present

## 2020-10-04 DIAGNOSIS — M5136 Other intervertebral disc degeneration, lumbar region: Secondary | ICD-10-CM | POA: Diagnosis not present

## 2020-10-04 DIAGNOSIS — I69354 Hemiplegia and hemiparesis following cerebral infarction affecting left non-dominant side: Secondary | ICD-10-CM | POA: Diagnosis not present

## 2020-10-12 DIAGNOSIS — E1165 Type 2 diabetes mellitus with hyperglycemia: Secondary | ICD-10-CM | POA: Diagnosis not present

## 2020-10-12 DIAGNOSIS — M5136 Other intervertebral disc degeneration, lumbar region: Secondary | ICD-10-CM | POA: Diagnosis not present

## 2020-10-12 DIAGNOSIS — I69354 Hemiplegia and hemiparesis following cerebral infarction affecting left non-dominant side: Secondary | ICD-10-CM | POA: Diagnosis not present

## 2020-10-12 DIAGNOSIS — D649 Anemia, unspecified: Secondary | ICD-10-CM | POA: Diagnosis not present

## 2020-10-12 DIAGNOSIS — I471 Supraventricular tachycardia: Secondary | ICD-10-CM | POA: Diagnosis not present

## 2020-10-12 DIAGNOSIS — M4313 Spondylolisthesis, cervicothoracic region: Secondary | ICD-10-CM | POA: Diagnosis not present

## 2020-10-12 DIAGNOSIS — M5030 Other cervical disc degeneration, unspecified cervical region: Secondary | ICD-10-CM | POA: Diagnosis not present

## 2020-10-12 DIAGNOSIS — I119 Hypertensive heart disease without heart failure: Secondary | ICD-10-CM | POA: Diagnosis not present

## 2020-10-12 DIAGNOSIS — I2510 Atherosclerotic heart disease of native coronary artery without angina pectoris: Secondary | ICD-10-CM | POA: Diagnosis not present

## 2020-10-14 NOTE — Progress Notes (Signed)
Carelink Summary Report / Loop Recorder 

## 2020-10-18 ENCOUNTER — Telehealth: Payer: Self-pay | Admitting: Cardiovascular Disease

## 2020-10-18 DIAGNOSIS — I471 Supraventricular tachycardia: Secondary | ICD-10-CM | POA: Diagnosis not present

## 2020-10-18 DIAGNOSIS — D649 Anemia, unspecified: Secondary | ICD-10-CM | POA: Diagnosis not present

## 2020-10-18 DIAGNOSIS — I69354 Hemiplegia and hemiparesis following cerebral infarction affecting left non-dominant side: Secondary | ICD-10-CM | POA: Diagnosis not present

## 2020-10-18 DIAGNOSIS — M4313 Spondylolisthesis, cervicothoracic region: Secondary | ICD-10-CM | POA: Diagnosis not present

## 2020-10-18 DIAGNOSIS — E1165 Type 2 diabetes mellitus with hyperglycemia: Secondary | ICD-10-CM | POA: Diagnosis not present

## 2020-10-18 DIAGNOSIS — I119 Hypertensive heart disease without heart failure: Secondary | ICD-10-CM | POA: Diagnosis not present

## 2020-10-18 DIAGNOSIS — I2510 Atherosclerotic heart disease of native coronary artery without angina pectoris: Secondary | ICD-10-CM | POA: Diagnosis not present

## 2020-10-18 DIAGNOSIS — M5030 Other cervical disc degeneration, unspecified cervical region: Secondary | ICD-10-CM | POA: Diagnosis not present

## 2020-10-18 DIAGNOSIS — M5136 Other intervertebral disc degeneration, lumbar region: Secondary | ICD-10-CM | POA: Diagnosis not present

## 2020-10-18 NOTE — Telephone Encounter (Signed)
Patient states she has to take a deep breath every now and then randomly day and night. This has occurred ~3 -4 times in a 24 hour period for the last week. Patient doesn't notice swelling in legs, maybe he left ankle a little, "if you didn't ask I would not have noticed." Hasn't missed any medications and no change in diet. She can not get her vital at this time because her husband has the BP cuff. She will check later and call back if the numbers are abnormal for her. Advised that the device clinic will be reaching out to check her loop implant. Gave ED precautions. Patient verbalized understanding.

## 2020-10-18 NOTE — Telephone Encounter (Signed)
Pt c/o Shortness Of Breath: STAT if SOB developed within the last 24 hours or pt is noticeably SOB on the phone  1. Are you currently SOB (can you hear that pt is SOB on the phone)? no  2. How long have you been experiencing SOB? About a week off and on   3. Are you SOB when sitting or when up moving around? Can happen any time either sitting still or after walking short distances from her bedroom to the bathroom   4. Are you currently experiencing any other symptoms?   Patient had a stroke about a month ago.

## 2020-10-18 NOTE — Telephone Encounter (Signed)
Returned patient's call. No episodes recorded on patient's loop recorder.

## 2020-10-22 ENCOUNTER — Other Ambulatory Visit: Payer: Self-pay

## 2020-10-22 ENCOUNTER — Ambulatory Visit: Payer: Medicare PPO | Admitting: Family Medicine

## 2020-10-22 ENCOUNTER — Encounter: Payer: Self-pay | Admitting: Family Medicine

## 2020-10-22 VITALS — BP 138/86 | HR 78 | Temp 97.6°F | Wt 155.0 lb

## 2020-10-22 DIAGNOSIS — R300 Dysuria: Secondary | ICD-10-CM | POA: Diagnosis not present

## 2020-10-22 DIAGNOSIS — R8290 Unspecified abnormal findings in urine: Secondary | ICD-10-CM

## 2020-10-22 DIAGNOSIS — E538 Deficiency of other specified B group vitamins: Secondary | ICD-10-CM | POA: Diagnosis not present

## 2020-10-22 DIAGNOSIS — G894 Chronic pain syndrome: Secondary | ICD-10-CM

## 2020-10-22 LAB — POC URINALSYSI DIPSTICK (AUTOMATED)
Bilirubin, UA: NEGATIVE
Blood, UA: NEGATIVE
Glucose, UA: NEGATIVE
Ketones, UA: NEGATIVE
Nitrite, UA: POSITIVE
Protein, UA: POSITIVE — AB
Spec Grav, UA: 1.025 (ref 1.010–1.025)
Urobilinogen, UA: 0.2 E.U./dL
pH, UA: 6 (ref 5.0–8.0)

## 2020-10-22 MED ORDER — CEPHALEXIN 500 MG PO CAPS
500.0000 mg | ORAL_CAPSULE | Freq: Two times a day (BID) | ORAL | 0 refills | Status: DC
Start: 2020-10-22 — End: 2020-11-18

## 2020-10-22 MED ORDER — OXYCODONE HCL 5 MG PO TABS: 5.0000 mg | ORAL_TABLET | Freq: Four times a day (QID) | ORAL | 0 refills | Status: DC | PRN

## 2020-10-22 MED ORDER — DICLOFENAC SODIUM 1 % EX GEL: 2.0000 g | Freq: Four times a day (QID) | CUTANEOUS | 1 refills | Status: DC | PRN

## 2020-10-22 MED ORDER — CYANOCOBALAMIN 1000 MCG/ML IJ SOLN
1000.0000 ug | Freq: Once | INTRAMUSCULAR | Status: AC
  Administered 2020-10-22: 1000 ug via INTRAMUSCULAR

## 2020-10-22 MED ORDER — ONETOUCH ULTRA VI STRP: ORAL_STRIP | 3 refills | Status: DC

## 2020-10-22 NOTE — Patient Instructions (Addendum)
Use the oxycodone as needed and let me know how that goes.  Take care.  Glad to see you. Start keflex in the meantime.

## 2020-10-22 NOTE — Progress Notes (Signed)
This visit occurred during the SARS-CoV-2 public health emergency.  Safety protocols were in place, including screening questions prior to the visit, additional usage of staff PPE, and extensive cleaning of exam room while observing appropriate contact time as indicated for disinfecting solutions.  dysuria: odor/frequency noted.   duration of symptoms: 1 week abdominal pain: no fevers:no back pain: at baseline.   vomiting:no U/a d/w pt.    Due for B12 shot, d/w pt.  She can schedule f/u on the way out.    She doesn't want to go through with back surgery, per patient this was a mutual decision with neurosurgery clinic.  She is taking oxycodone prn, only when ibuprofen doesn't help.  Not using oxycodone daily.  No ADE on med.  She may take ~4-5 tabs per week.  She rarely takes tramadol.  We talked about [place] STOP ACT, routine cautions with opiates, routine guidelines.  She is not abusing misusing or diverting the medication.  No adverse effect on medication.  I agreed to prescribe oxycodone for her.  This appears to be the best option at this point given her previous surgeries and medication response/tolerance is.  Diclofenac gel helped some. She should be able to tolerate it.  D/w pt.    Meds, vitals, and allergies reviewed.  Per HPI unless specifically indicated in ROS section   GEN: nad, alert and oriented HEENT: ncat NECK: supple CV: rrr.  PULM: ctab, no inc wob ABD: soft, +bs, suprapubic area not tender EXT: no edema SKIN: no acute rash BACK: no CVA pain

## 2020-10-24 DIAGNOSIS — G8929 Other chronic pain: Secondary | ICD-10-CM | POA: Insufficient documentation

## 2020-10-24 LAB — URINE CULTURE
MICRO NUMBER:: 11993962
SPECIMEN QUALITY:: ADEQUATE

## 2020-10-24 NOTE — Assessment & Plan Note (Signed)
Urinalysis discussed with patient.  Start Keflex.  See orders.  Check urine culture.  She agrees with plan.  Okay for outpatient follow-up.

## 2020-10-24 NOTE — Assessment & Plan Note (Signed)
See above.  She can return for contract and follow-up UDS.  She already gave a urine sample for urinalysis today which was already processed.  Continue oxycodone as needed.  No sedation on medication.  She is not sedated at office visit.  Routine cautions given to patient.  She agrees with plan.

## 2020-10-24 NOTE — Assessment & Plan Note (Signed)
Injection done at office visit today.  She can schedule follow-up on the way out.

## 2020-10-25 ENCOUNTER — Other Ambulatory Visit: Payer: Self-pay

## 2020-10-25 ENCOUNTER — Ambulatory Visit: Payer: Medicare PPO

## 2020-10-25 ENCOUNTER — Telehealth: Payer: Self-pay

## 2020-10-25 ENCOUNTER — Ambulatory Visit (INDEPENDENT_AMBULATORY_CARE_PROVIDER_SITE_OTHER): Payer: Medicare PPO

## 2020-10-25 DIAGNOSIS — I639 Cerebral infarction, unspecified: Secondary | ICD-10-CM | POA: Diagnosis not present

## 2020-10-25 NOTE — Telephone Encounter (Signed)
Called patient to complete her AWV. Patient stated that she was on her way our the door to another appointment she has forgot about and she will have to reschedule this for a later date. Appointment cancelled per patient request.

## 2020-10-25 NOTE — Progress Notes (Signed)
error 

## 2020-10-28 LAB — CUP PACEART REMOTE DEVICE CHECK
Date Time Interrogation Session: 20220613103916
Implantable Pulse Generator Implant Date: 20220407

## 2020-11-03 ENCOUNTER — Ambulatory Visit (INDEPENDENT_AMBULATORY_CARE_PROVIDER_SITE_OTHER): Payer: Medicare PPO

## 2020-11-03 DIAGNOSIS — E538 Deficiency of other specified B group vitamins: Secondary | ICD-10-CM | POA: Diagnosis not present

## 2020-11-03 MED ORDER — CYANOCOBALAMIN 1000 MCG/ML IJ SOLN
1000.0000 ug | Freq: Once | INTRAMUSCULAR | Status: AC
  Administered 2020-11-03: 1000 ug via INTRAMUSCULAR

## 2020-11-03 NOTE — Progress Notes (Deleted)
Per orders of Dr. Gutierrez, injection of vit B12 given by Francene Mcerlean. Patient tolerated injection well.  

## 2020-11-03 NOTE — Progress Notes (Signed)
Per orders of Dr. Gutierrez, injection of vit B12 given by Kiaya Haliburton. Patient tolerated injection well.  

## 2020-11-04 ENCOUNTER — Ambulatory Visit: Payer: Medicare PPO

## 2020-11-05 ENCOUNTER — Ambulatory Visit: Payer: Medicare PPO | Admitting: Family Medicine

## 2020-11-09 DIAGNOSIS — E119 Type 2 diabetes mellitus without complications: Secondary | ICD-10-CM | POA: Diagnosis not present

## 2020-11-09 DIAGNOSIS — H524 Presbyopia: Secondary | ICD-10-CM | POA: Diagnosis not present

## 2020-11-10 ENCOUNTER — Other Ambulatory Visit: Payer: Self-pay | Admitting: Cardiovascular Disease

## 2020-11-16 NOTE — Progress Notes (Signed)
Carelink Summary Report / Loop Recorder 

## 2020-11-18 ENCOUNTER — Telehealth: Payer: Self-pay

## 2020-11-18 MED ORDER — CEPHALEXIN 500 MG PO CAPS: 500.0000 mg | ORAL_CAPSULE | Freq: Two times a day (BID) | ORAL | 0 refills | Status: DC

## 2020-11-18 NOTE — Telephone Encounter (Signed)
Patient notified rx was sent and advised if no better after to make appt for recheck.

## 2020-11-18 NOTE — Telephone Encounter (Signed)
Pt said that she had a UTI last wk and finished abx and still has urinary odor (the same odor pt had when seen 10/22/20; (it was not last wk that pt had UTI)pt was last seen for UTI on 10/22/20 and pt finished abx about 10/30/20. Pt said she never completely cleared of symptoms; the symptoms got better but did not completely go away. Pt said now frequency and urgency of urine. No burning or pain upon urination; no fever and no abd or back pain. Pt request refill of abx to [facility]. Pt request cb after reviewed by Dr Para March.

## 2020-11-18 NOTE — Telephone Encounter (Signed)
Sent. If not better after this, then needs recheck.  Thanks.

## 2020-11-21 ENCOUNTER — Other Ambulatory Visit: Payer: Self-pay | Admitting: Family Medicine

## 2020-11-21 DIAGNOSIS — E1159 Type 2 diabetes mellitus with other circulatory complications: Secondary | ICD-10-CM

## 2020-11-21 DIAGNOSIS — E538 Deficiency of other specified B group vitamins: Secondary | ICD-10-CM

## 2020-11-23 ENCOUNTER — Other Ambulatory Visit: Payer: Self-pay

## 2020-11-23 ENCOUNTER — Other Ambulatory Visit (INDEPENDENT_AMBULATORY_CARE_PROVIDER_SITE_OTHER): Payer: Medicare PPO

## 2020-11-23 ENCOUNTER — Ambulatory Visit (INDEPENDENT_AMBULATORY_CARE_PROVIDER_SITE_OTHER): Payer: Medicare PPO | Admitting: *Deleted

## 2020-11-23 DIAGNOSIS — E538 Deficiency of other specified B group vitamins: Secondary | ICD-10-CM

## 2020-11-23 DIAGNOSIS — E1159 Type 2 diabetes mellitus with other circulatory complications: Secondary | ICD-10-CM | POA: Diagnosis not present

## 2020-11-23 LAB — CBC WITH DIFFERENTIAL/PLATELET
Basophils Absolute: 0 10*3/uL (ref 0.0–0.1)
Basophils Relative: 0.6 % (ref 0.0–3.0)
Eosinophils Absolute: 0.4 10*3/uL (ref 0.0–0.7)
Eosinophils Relative: 5.2 % — ABNORMAL HIGH (ref 0.0–5.0)
HCT: 37.2 % (ref 36.0–46.0)
Hemoglobin: 12.8 g/dL (ref 12.0–15.0)
Lymphocytes Relative: 24.1 % (ref 12.0–46.0)
Lymphs Abs: 1.9 10*3/uL (ref 0.7–4.0)
MCHC: 34.5 g/dL (ref 30.0–36.0)
MCV: 89.3 fl (ref 78.0–100.0)
Monocytes Absolute: 0.5 10*3/uL (ref 0.1–1.0)
Monocytes Relative: 6.6 % (ref 3.0–12.0)
Neutro Abs: 5 10*3/uL (ref 1.4–7.7)
Neutrophils Relative %: 63.5 % (ref 43.0–77.0)
Platelets: 379 10*3/uL (ref 150.0–400.0)
RBC: 4.17 Mil/uL (ref 3.87–5.11)
RDW: 12.9 % (ref 11.5–15.5)
WBC: 7.8 10*3/uL (ref 4.0–10.5)

## 2020-11-23 LAB — COMPREHENSIVE METABOLIC PANEL
ALT: 12 U/L (ref 0–35)
AST: 19 U/L (ref 0–37)
Albumin: 4.4 g/dL (ref 3.5–5.2)
Alkaline Phosphatase: 81 U/L (ref 39–117)
BUN: 19 mg/dL (ref 6–23)
CO2: 26 mEq/L (ref 19–32)
Calcium: 9.6 mg/dL (ref 8.4–10.5)
Chloride: 106 mEq/L (ref 96–112)
Creatinine, Ser: 1.05 mg/dL (ref 0.40–1.20)
GFR: 49.9 mL/min — ABNORMAL LOW (ref >60.00)
Glucose, Bld: 104 mg/dL — ABNORMAL HIGH (ref 70–99)
Potassium: 4.4 mEq/L (ref 3.5–5.1)
Sodium: 142 mEq/L (ref 135–145)
Total Bilirubin: 0.7 mg/dL (ref 0.2–1.2)
Total Protein: 6.7 g/dL (ref 6.0–8.3)

## 2020-11-23 LAB — HEMOGLOBIN A1C: Hgb A1c MFr Bld: 6.9 % — ABNORMAL HIGH (ref 4.6–6.5)

## 2020-11-23 LAB — VITAMIN B12: Vitamin B-12: 398 pg/mL (ref 211–911)

## 2020-11-23 LAB — LIPID PANEL
Cholesterol: 130 mg/dL (ref 0–200)
HDL: 36.7 mg/dL — ABNORMAL LOW (ref >39.00)
LDL Cholesterol: 70 mg/dL (ref 0–99)
NonHDL: 92.87
Total CHOL/HDL Ratio: 4
Triglycerides: 115 mg/dL (ref 0.0–149.0)
VLDL: 23 mg/dL (ref 0.0–40.0)

## 2020-11-23 MED ORDER — CYANOCOBALAMIN 1000 MCG/ML IJ SOLN
1000.0000 ug | Freq: Once | INTRAMUSCULAR | Status: AC
Start: 2020-11-23 — End: 2020-11-23
  Administered 2020-11-23: 1000 ug via INTRAMUSCULAR

## 2020-11-23 NOTE — Progress Notes (Signed)
Per orders of Dr. Para March, injection of B12 given by Blenda Mounts M in left deltoid. Patient tolerated injection well.

## 2020-11-26 ENCOUNTER — Telehealth: Payer: Self-pay | Admitting: Cardiovascular Disease

## 2020-11-26 NOTE — Telephone Encounter (Signed)
Pt c/o medication issue:  1. Name of Medication:  clopidogrel (PLAVIX) 75 MG tablet  2. How are you currently taking this medication (dosage and times per day)? As prescribed   3. Are you having a reaction (difficulty breathing--STAT)?   4. What is your medication issue?   Patient states she has been having headaches for the past 2-3 months and she assumes it's due to taking Plavix. She would like to know if she needs to continue taking it since she also takes Aspirin. Please advise.

## 2020-11-26 NOTE — Telephone Encounter (Signed)
Informed pt of the need to contact PCP re Plavix as this was started due to a TIA  or discuss with Neurologists .Maria [name]

## 2020-11-29 ENCOUNTER — Ambulatory Visit (INDEPENDENT_AMBULATORY_CARE_PROVIDER_SITE_OTHER): Payer: Medicare PPO

## 2020-11-29 DIAGNOSIS — I639 Cerebral infarction, unspecified: Secondary | ICD-10-CM | POA: Diagnosis not present

## 2020-11-30 LAB — CUP PACEART REMOTE DEVICE CHECK
Date Time Interrogation Session: 20220716103704
Implantable Pulse Generator Implant Date: 20220407

## 2020-12-02 ENCOUNTER — Ambulatory Visit: Payer: Medicare PPO | Admitting: Family Medicine

## 2020-12-09 ENCOUNTER — Encounter: Payer: Self-pay | Admitting: Family Medicine

## 2020-12-09 ENCOUNTER — Other Ambulatory Visit: Payer: Self-pay

## 2020-12-09 ENCOUNTER — Ambulatory Visit: Payer: Medicare PPO | Admitting: Family Medicine

## 2020-12-09 VITALS — BP 128/72 | HR 84 | Temp 97.8°F | Ht 65.0 in | Wt 155.0 lb

## 2020-12-09 DIAGNOSIS — G894 Chronic pain syndrome: Secondary | ICD-10-CM

## 2020-12-09 DIAGNOSIS — E538 Deficiency of other specified B group vitamins: Secondary | ICD-10-CM | POA: Diagnosis not present

## 2020-12-09 DIAGNOSIS — H6980 Other specified disorders of Eustachian tube, unspecified ear: Secondary | ICD-10-CM | POA: Diagnosis not present

## 2020-12-09 DIAGNOSIS — E1159 Type 2 diabetes mellitus with other circulatory complications: Secondary | ICD-10-CM | POA: Diagnosis not present

## 2020-12-09 MED ORDER — CYANOCOBALAMIN 1000 MCG/ML IJ SOLN
1000.0000 ug | Freq: Once | INTRAMUSCULAR | Status: AC
  Administered 2020-12-09: 1000 ug via INTRAMUSCULAR

## 2020-12-09 MED ORDER — OXYCODONE HCL 5 MG PO TABS: 5.0000 mg | ORAL_TABLET | Freq: Four times a day (QID) | ORAL | 0 refills | Status: DC | PRN

## 2020-12-09 MED ORDER — GLYBURIDE 5 MG PO TABS: 2.5000 mg | ORAL_TABLET | Freq: Two times a day (BID) | ORAL | Status: DC

## 2020-12-09 NOTE — Patient Instructions (Signed)
If you have more ear pain, then gently try to pop your ears and use flonase.   B12 shot every 14 days.  Dose done today.  Update me when you need refills on pain meds.  Take care.  Glad to see you. Plan on recheck in about 3 months.

## 2020-12-09 NOTE — Progress Notes (Signed)
This visit occurred during the SARS-CoV-2 public health emergency.  Safety protocols were in place, including screening questions prior to the visit, additional usage of staff PPE, and extensive cleaning of exam room while observing appropriate contact time as indicated for disinfecting solutions.  Due for B12 shot, d/w pt about recent labs.    Most recent A1c controlled, d/w pt.  Still on glyburide.  1/2 tab BID.  No lows or highs.  Compliant with glyburide.  Lower back pain.  Still using oxycodone as needed.  It helps.  No ADE in med. Refill sent.    R ear sx.  Noted episodically at night with pain.  Warm compress helps.  No drainage.  No fevers.  Meds, vitals, and allergies reviewed.   ROS: Per HPI unless specifically indicated in ROS section   Nad Ncat Minimal R ear wax.  She has B TM movement with valsalva today.   Neck supple, no LA Rrr Ctab

## 2020-12-12 NOTE — Assessment & Plan Note (Signed)
No change in glyburide.  Recheck periodically.

## 2020-12-12 NOTE — Assessment & Plan Note (Signed)
Continue replacement.  See med list in orders and after visit summary.

## 2020-12-12 NOTE — Assessment & Plan Note (Signed)
She is trying to avoid another procedure on her back.  Would continue oxycodone as is.  No adverse effect on medication.  Not sedated.  Routine cautions given to patient.

## 2020-12-12 NOTE — Assessment & Plan Note (Signed)
Likely has eustachian tube dysfunction.  Anatomy discussed with patient.  If she has recurrent symptoms then use Flonase and gently perform Valsalva and update me as needed.  She agrees with plan.

## 2020-12-15 ENCOUNTER — Other Ambulatory Visit: Payer: Self-pay | Admitting: Family Medicine

## 2020-12-17 ENCOUNTER — Other Ambulatory Visit: Payer: Self-pay | Admitting: Family Medicine

## 2020-12-20 ENCOUNTER — Telehealth: Payer: Self-pay

## 2020-12-20 NOTE — Telephone Encounter (Signed)
The patient states she been having sharp pain where her loop recorder at. It started last week and today she had some pain. Pt denies chest pain right now.  I told the patient the nurse will give her a call at [phone].

## 2020-12-21 NOTE — Progress Notes (Signed)
Carelink Summary Report / Loop Recorder 

## 2020-12-21 NOTE — Telephone Encounter (Signed)
Spoke with pt.  She states the pain is not localized to where her loop recorder is.  It is her whole chest that will hurt briefly.  It has happened a couple of times now, most recently yesterday.  Pt denies any other symptoms during event.  Pain resolves on its own after a few moments.    Advised patient that ILR did not capture any arrhythmias.  Stressed to pt that if symptom continues to occur or other symptoms occur she needs immediate attention.

## 2020-12-23 ENCOUNTER — Encounter: Payer: Self-pay | Admitting: Family Medicine

## 2020-12-23 ENCOUNTER — Telehealth (INDEPENDENT_AMBULATORY_CARE_PROVIDER_SITE_OTHER): Payer: Medicare PPO | Admitting: Family Medicine

## 2020-12-23 ENCOUNTER — Telehealth: Payer: Self-pay | Admitting: *Deleted

## 2020-12-23 DIAGNOSIS — U071 COVID-19: Secondary | ICD-10-CM

## 2020-12-23 MED ORDER — MOLNUPIRAVIR EUA 200MG CAPSULE: 4.0000 | ORAL_CAPSULE | Freq: Two times a day (BID) | ORAL | 0 refills | Status: AC

## 2020-12-23 NOTE — Telephone Encounter (Signed)
See phone visit note

## 2020-12-23 NOTE — Telephone Encounter (Signed)
PLEASE NOTE: All timestamps contained within this report are represented as [place] Standard Time. CONFIDENTIALTY NOTICE: This fax transmission is intended only for the addressee. It contains information that is legally privileged, confidential or otherwise protected from use or disclosure. If you are not the intended recipient, you are strictly prohibited from reviewing, disclosing, copying using or disseminating any of this information or taking any action in reliance on or regarding this information. If you have received this fax in error, please notify us immediately by telephone so that we can arrange for its return to [name]. Phone: [phone], Toll-Free: [phone], Fax: [phone] Page: 1 of 2 Call Id: [id number] [facility] Day - Client TELEPHONE ADVICE RECORD AccessNurse Patient Name: Maria [name] Gender: Female DOB: 09/12/1940 Age: 80 Y 4 M 20 D Return Phone Number: [phone] (Primary) Address: City/ State/ Zip: [place]  [address] Client [facility] Day - Client Client Site [facility] - Day Physician Raechel Ache - MD Contact Type Call Who Is Calling Patient / Member / Family / Caregiver Call Type Triage / Clinical Relationship To Patient Self Return Phone Number [phone] (Primary) Chief Complaint Sore Throat Reason for Call Symptomatic / Request for Health Information Initial Comment Caller states she was exposed to covid and experiencing bodyache sore throat and cough. Translation No Nurse Assessment Nurse: Lily Kocher, RN, Adriana Date/Time (Eastern Time): 12/23/2020 2:51:39 PM Confirm and document reason for call. If symptomatic, describe symptoms. ---pt states she was exposed to covid on Monday. pt is experiencing a sore throat, cough, and hoarseness, headache earlier and mild body aches. no fevers. pt is vaccinated and booster x1. sx onset last night Does the patient have any new or  worsening symptoms? ---Yes Will a triage be completed? ---Yes Related visit to physician within the last 2 weeks? ---No Does the PT have any chronic conditions? (i.e. diabetes, asthma, this includes High risk factors for pregnancy, etc.) ---Yes List chronic conditions. ---diabetes heart disease neuropathy Is this a behavioral health or substance abuse call? ---No Guidelines Guideline Title Affirmed Question Affirmed Notes Nurse Date/Time (Eastern Time) COVID-19 - Diagnosed or Suspected [1] HIGH RISK for severe COVID complications (e.g., weak immune system, age > 64 years, obesity with BMI > 25, pregnant, chronic lung disease or other chronic medical Lily Kocher, RN, Ricki Rodriguez 12/23/2020 2:54:22 PM PLEASE NOTE: All timestamps contained within this report are represented as [place] Standard Time. CONFIDENTIALTY NOTICE: This fax transmission is intended only for the addressee. It contains information that is legally privileged, confidential or otherwise protected from use or disclosure. If you are not the intended recipient, you are strictly prohibited from reviewing, disclosing, copying using or disseminating any of this information or taking any action in reliance on or regarding this information. If you have received this fax in error, please notify us immediately by telephone so that we can arrange for its return to [name]. Phone: [phone], Toll-Free: [phone], Fax: [phone] Page: 2 of 2 Call Id: [id number] Guidelines Guideline Title Affirmed Question Affirmed Notes Nurse Date/Time Lamount Cohen Time) condition) AND [2] COVID symptoms (e.g., cough, fever) (Exceptions: Already seen by PCP and no new or worsening symptoms.) Disp. Time Lamount Cohen Time) Disposition Final User 12/23/2020 3:12:03 PM Call PCP Now Yes Lily Kocher, RN, Adriana Caller Disagree/Comply Comply Caller Understands Yes PreDisposition Call Doctor Care Advice Given Per Guideline CALL PCP NOW: COUGH MEDICINES: *  COUGH DROPS: Over-the-counter cough drops can help a lot, especially for mild coughs. They soothe an irritated throat  and remove the tickle sensation in the back of the throat. Cough drops are easy to carry with you. * HOME REMEDY - HARD CANDY: Hard candy works just as well as over-the-counter cough drops. People who have diabetes should use sugar-free candy. FEVER MEDICINES: * For fevers above 101 F (38.3 C) take either acetaminophen or ibuprofen. CALL BACK IF: * You become worse CARE ADVICE given per COVID-19 - DIAGNOSED OR SUSPECTED (Adult) guideline. Comments User: Jerilynn Birkenhead, RN Date/Time Lamount Cohen Time): 12/23/2020 3:11:30 PM office states a nurse (from the office) will call pt

## 2020-12-23 NOTE — Telephone Encounter (Signed)
Patient scheduled for a phone visit today 12/23/20 with Dr. Para March at 4:30 pm.

## 2020-12-23 NOTE — Progress Notes (Signed)
Interactive audio and video telecommunications were attempted between this provider and patient, however failed, due to patient having technical difficulties OR patient did not have access to video capability.  We continued and completed visit with audio only.   Virtual Visit via Telephone Note  I connected with patient on 12/23/20  at 4:43 PM  by telephone and verified that I am speaking with the correct person using two identifiers.  Location of patient: home  Location of MD:  [facility] Name of referring provider (if blank then none associated): Names per persons and role in encounter:  MD: Ferd Hibbs, Patient: name listed above.    I discussed the limitations, risks, security and privacy concerns of performing an evaluation and management service by telephone and the availability of in person appointments. I also discussed with the patient that there may be a patient responsible charge related to this service. The patient expressed understanding and agreed to proceed.  CC: covid  History of Present Illness: covid exposure.  Recent sick exposure (was picking out a wedding dress with her granddaughter).  Contact found out about positive status after exposure to patient.  Sx started today for patient.  Positive covid test today.  ST and HA today.  Some cough.  No fevers known.  Not SOB.  Speaking in complete sentences.  No vomiting, no diarrhea.      Observations/Objective:nad Speech slightly hoarse but not in distress.  Assessment and Plan: COVID. Routine cautions given to patient.  Still okay for outpatient follow-up.  Rest and fluids.  Emergency use authorization for antivirals discussed with patient.  Discussed with patient about rationale for using molnupiravir.  This would be reasonable in her situation.  She agrees.  Routine instructions given.  Routine cautions given.  Prescription sent.  She will update [name] as needed.  If short of breath, to ER.  Routine quarantine  instructions given to patient.  Follow Up Instructions: see above.    I discussed the assessment and treatment plan with the patient. The patient was provided an opportunity to ask questions and all were answered. The patient agreed with the plan and demonstrated an understanding of the instructions.   The patient was advised to call back or seek an in-person evaluation if the symptoms worsen or if the condition fails to improve as anticipated.  I provided 13 minutes of non-face-to-face time during this encounter.  Crawford Givens, MD

## 2020-12-26 DIAGNOSIS — U071 COVID-19: Secondary | ICD-10-CM | POA: Insufficient documentation

## 2020-12-26 NOTE — Assessment & Plan Note (Signed)
COVID. Routine cautions given to patient.  Still okay for outpatient follow-up.  Rest and fluids.  Emergency use authorization for antivirals discussed with patient.  Discussed with patient about rationale for using molnupiravir.  This would be reasonable in her situation.  She agrees.  Routine instructions given.  Routine cautions given.  Prescription sent.  She will update [name] as needed.  If short of breath, to ER.  Routine quarantine instructions given to patient.

## 2020-12-28 ENCOUNTER — Other Ambulatory Visit: Payer: Self-pay | Admitting: Family Medicine

## 2020-12-28 NOTE — Telephone Encounter (Signed)
Last filled by Dr Perlie Mayo at the hospital on 08/20/20 #90 with 0 refill. LOV 12/09/20 routine follow up  No future appointments.

## 2020-12-29 NOTE — Telephone Encounter (Signed)
Unless she has been advised o/w in the meantime, would stop clopidogrel and continue aspirin.  Notes from inpatient stay state: ASA 325 and Plavix 75 for 3 mon and then ASA alone given large vessel stenosis/occlusion.   Thanks.

## 2020-12-30 LAB — CUP PACEART REMOTE DEVICE CHECK
Date Time Interrogation Session: 20220818103656
Implantable Pulse Generator Implant Date: 20220407

## 2021-01-03 ENCOUNTER — Ambulatory Visit (INDEPENDENT_AMBULATORY_CARE_PROVIDER_SITE_OTHER): Payer: Medicare PPO

## 2021-01-03 DIAGNOSIS — I639 Cerebral infarction, unspecified: Secondary | ICD-10-CM | POA: Diagnosis not present

## 2021-01-03 NOTE — Telephone Encounter (Signed)
Spoke with patient. Patient was not advised otherwise that she knows of. She will start taking Aspirin only.

## 2021-01-13 ENCOUNTER — Telehealth: Payer: Self-pay

## 2021-01-13 ENCOUNTER — Other Ambulatory Visit (INDEPENDENT_AMBULATORY_CARE_PROVIDER_SITE_OTHER): Payer: Medicare PPO

## 2021-01-13 DIAGNOSIS — R300 Dysuria: Secondary | ICD-10-CM

## 2021-01-13 MED ORDER — SULFAMETHOXAZOLE-TRIMETHOPRIM 800-160 MG PO TABS: 1.0000 | ORAL_TABLET | Freq: Two times a day (BID) | ORAL | 0 refills | Status: DC

## 2021-01-13 NOTE — Telephone Encounter (Addendum)
Would start septra (1 tab BID x3 days, #6 tabs total) once the urine is collected at home.  Update [name] as needed.  Please drop off urine when possible.  Have her label the container with her name and DOB.  I put in order for u/a and ucx. Thanks.    Routed to lab as FYI.

## 2021-01-13 NOTE — Telephone Encounter (Signed)
[facility] Day - Client TELEPHONE ADVICE RECORD Access[facility]urse Patient Name: Maria [name] Gender: Female DOB: 1940/07/06 Age: 80 Y 5 M 10 D Return Phone Number: [phone] (Primary), [phone] (Secondary) Address: City/ State/ Zip: [place] [address] Client [facility] Day - Client Client Site [facility] - Day Physician Raechel Ache - MD Contact Type Call Who Is Calling Patient / Member / Family / Caregiver Call Type Triage / Clinical Relationship To Patient Self Return Phone Number [phone] (Primary) Chief Complaint Urination Frequency Reason for Call Symptomatic / Request for Health Information Initial Comment Caller states she is having burning while urinating and frequency with odor. *No appointments available today. Translation No Nurse Assessment Nurse: Carylon Perches, RN, Hilda Lias Date/Time (Eastern Time): 01/13/2021 9:50:20 AM Confirm and document reason for call. If symptomatic, describe symptoms. ---Caller states she is having burning while urinating and frequency with odor. Afebrile. S/s started yesterday. Does the patient have any new or worsening symptoms? ---Yes Will a triage be completed? ---Yes Related visit to physician within the last 2 weeks? ---No Does the PT have any chronic conditions? (i.e. diabetes, asthma, this includes High risk factors for pregnancy, etc.) ---Yes List chronic conditions. ---diabetes, neuropathy Is this a behavioral health or substance abuse call? ---No Guidelines Guideline Title Affirmed Question Affirmed Notes Nurse Date/Time Lamount Cohen Time) Urinary Symptoms Urinating more frequently than usual (i.e., frequency) Carylon Perches, RN, Hilda Lias 01/13/2021 9:51:36 AM Disp. Time Lamount Cohen Time) Disposition Final User 01/13/2021 9:53:47 AM See PCP within 24 Hours Yes Carylon Perches, RN, Saul Fordyce NOTE: All timestamps contained within this report are represented as [place]  Standard Time. CONFIDENTIALTY NOTICE: This fax transmission is intended only for the addressee. It contains information that is legally privileged, confidential or otherwise protected from use or disclosure. If you are not the intended recipient, you are strictly prohibited from reviewing, disclosing, copying using or disseminating any of this information or taking any action in reliance on or regarding this information. If you have received this fax in error, please notify us immediately by telephone so that we can arrange for its return to [name]. Phone: [phone], Toll-Free: [phone], Fax: [phone] Page: 2 of 2 Call Id: [id number] Caller Disagree/Comply Comply Caller Understands Yes PreDisposition Did not know what to do Care Advice Given Per Guideline SEE PCP WITHIN 24 HOURS: * IF OFFICE WILL BE OPEN: You need to be examined within the next 24 hours. Call your doctor (or NP/PA) when the office opens and make an appointment. CALL BACK IF: * Fever occurs * Unable to urinate and bladder feels full * You become worse CARE ADVICE given per Urinary Symptoms (Adult) guideline. Referrals REFERRED TO PCP OFFICE

## 2021-01-13 NOTE — Telephone Encounter (Signed)
Patient is aware rx was sent to start. Patient is bringing urine sample up here this afternoon; advised to be sure name and DOB is on container.

## 2021-01-13 NOTE — Telephone Encounter (Signed)
Please see note below access nurse note. 

## 2021-01-13 NOTE — Addendum Note (Signed)
Addended by: Joaquim Nam on: 01/13/2021 01:57 PM   Modules accepted: Orders

## 2021-01-13 NOTE — Telephone Encounter (Signed)
Marie nurse with access nurse said pt having burning upon urination and odor to urine; I spoke with pt and pt gets UTI after drinks a soda and pt was recently at baby shower and drank a soda and symptoms started on 01/12/21. No abd or back pain and no fever. Pt has repair person coming to house today and pt can no longer drive and her husband has an all day appt on 01/14/21. I spoke with Dr Para March because pt wants her husband to bring a urine specimen in scalded mason jar today. Dr Para March said to send note to him; pt will wait for cb with when to bring urine specimen. Pt very appreciative. I advised pt to try to avoid soda.[facility].

## 2021-01-14 ENCOUNTER — Other Ambulatory Visit: Payer: Self-pay | Admitting: Family Medicine

## 2021-01-14 LAB — URINALYSIS, ROUTINE W REFLEX MICROSCOPIC
Bilirubin Urine: NEGATIVE
Ketones, ur: NEGATIVE
Nitrite: NEGATIVE
Specific Gravity, Urine: 1.025 (ref 1.000–1.030)
Urine Glucose: NEGATIVE
Urobilinogen, UA: 0.2 (ref 0.0–1.0)
pH: 6 (ref 5.0–8.0)

## 2021-01-14 NOTE — Telephone Encounter (Signed)
Sent. Thanks.   

## 2021-01-14 NOTE — Telephone Encounter (Signed)
Refill request for Tramadol 50mg  tablets  LOV - 12/09/20 Next OV - not scheduled Last refill - unsure last refill date as was done by hist. Provider

## 2021-01-16 LAB — URINE CULTURE
MICRO NUMBER:: 12321937
SPECIMEN QUALITY:: ADEQUATE

## 2021-01-19 NOTE — Progress Notes (Signed)
Carelink Summary Report / Loop Recorder 

## 2021-02-02 LAB — CUP PACEART REMOTE DEVICE CHECK
Date Time Interrogation Session: 20220920103403
Implantable Pulse Generator Implant Date: 20220407

## 2021-02-07 ENCOUNTER — Ambulatory Visit (INDEPENDENT_AMBULATORY_CARE_PROVIDER_SITE_OTHER): Payer: Medicare PPO

## 2021-02-07 DIAGNOSIS — I639 Cerebral infarction, unspecified: Secondary | ICD-10-CM

## 2021-02-14 NOTE — Progress Notes (Signed)
Carelink Summary Report / Loop Recorder 

## 2021-03-08 LAB — CUP PACEART REMOTE DEVICE CHECK
Date Time Interrogation Session: 20221023103420
Implantable Pulse Generator Implant Date: 20220407

## 2021-03-14 ENCOUNTER — Ambulatory Visit (INDEPENDENT_AMBULATORY_CARE_PROVIDER_SITE_OTHER): Payer: Medicare PPO

## 2021-03-14 DIAGNOSIS — I639 Cerebral infarction, unspecified: Secondary | ICD-10-CM | POA: Diagnosis not present

## 2021-03-21 NOTE — Progress Notes (Signed)
Carelink Summary Report / Loop Recorder 

## 2021-03-25 ENCOUNTER — Telehealth: Payer: Self-pay | Admitting: Family Medicine

## 2021-03-25 NOTE — Telephone Encounter (Signed)
Sent. Thanks.  Needs DM2 f/u when possible this winter.

## 2021-03-25 NOTE — Telephone Encounter (Signed)
Called Mrs. Rauls and got her scheduled for 12/20 at 1130

## 2021-03-25 NOTE — Telephone Encounter (Signed)
Refill request for Tramadol 50 mg tablets  LOV - 12/09/20 Next OV - not scheduled Last refill - 01/14/21 #60/2

## 2021-04-13 ENCOUNTER — Telehealth: Payer: Self-pay | Admitting: Cardiovascular Disease

## 2021-04-13 NOTE — Telephone Encounter (Signed)
Patient c/o Palpitations:  High priority if patient c/o lightheadedness, shortness of breath, or chest pain  How long have you had palpitations/irregular HR/ Afib? Are you having the symptoms now? NO PAIN RIGHT NOW BUT PT HAS PALPITATIONS ON 04/12/21  Are you currently experiencing lightheadedness, SOB or CP? NO  Do you have a history of afib (atrial fibrillation) or irregular heart rhythm? NO  Have you checked your BP or HR? (document readings if available): NO, PT TOOK BP CUFF WITH HIM TO HIS DRS/ APPT  Are you experiencing any other symptoms? NO OTHER SYMPTOMS

## 2021-04-13 NOTE — Telephone Encounter (Signed)
Attempted to review Linq transmission from last night. Noted that patient has not connected to relay since March 11, 2021. Successful telephone encounter to patient to assist with reconnecting monitor with implanted loop. Patient states she has been having significant Internet issues and her Internet provider is coming Ambulance person. This has been ongoing. She is provided [employer] tech support at [phone] and instructed to call for assistance with reconnection once Internet is up and running. Patient verbalized understanding. She denies need for in-clinic appointment for interrogation. ED precautions provided.

## 2021-04-13 NOTE — Telephone Encounter (Signed)
Pt called to report that yesterday she had a few episodes of her heart "bounding" she says no palpitations, no dizziness.. she feels well today and says she may not had as much fluids as she usually does... she says she would like to see Dr. [name] simce it has been a wjuile and she is unsyre about her meds... she has been taken off of Plavix and ASA but she still takes ASA 81 mg at night.. I made her an appt for Tuesday 04/19/21....she declined a sooner appt.  I will forward to the device nurses to see if anything was recorded on her Loop yesterday that we can possibly treat prior to her appt.

## 2021-04-14 NOTE — Progress Notes (Signed)
Cardiology Office Note    Date:  04/19/2021   ID:  Maria [name], DOB 1940/12/24, MRN [id number]  PCP:  Tonia Ghent, MD  Cardiologist:  Jenkins Rouge, MD  Electrophysiologist:  None    History of Present Illness:   Maria [name] is a 80 y.o. female with history of CAD s/p DES to CX 2016, HTN, DM, GERD, HLD (intolerant of statins, hoarse voice with Repatha, myalgias with Praluent), SVT s/p ablation 2016, sinus pause by monitoring 2016 (BB stopped at that time), prior episode of syncope 2020 (hypoglycemia, possible orthostasis/vagal related to back pain), falls, RBBB/LAFB, mild carotid disease (1-39% BICA 2016), stroke s/p ILR. Last cath was in 2017 showing 30% prox RCA, 25% D1, patent LCx stent, normal LV function. Last stress test in 01/2018 was low risk, probable normal perfusion and mild soft tissue attenuation (diaphragm), no ischemia or scar.   April 2022 admitted  with stroke manifested by confusion, slurred speech and word finding difficulty. Cerebrovascular disease was noted on CTA with distal A2 occlusion. CT head showed no acute finding, 7 mm hemangioma overlying the right frontal convexity without associated mass-effect. MRI small acute on chronic white matter infarct at the right corona radiata, right superior frontal gyrus. 2D Echo 08/19/20 showed EF 60-65%, grade 2 DD, elevated LVEDP, mild aortic sclerosis without stenosis. Loop recorder was placed by EP team. Neurology recommended ASA 325 and Plavix 75 for 3 months and then ASA alone given large vessel stenosis/occlusion.  Loop with no PAF to date She struggles with chronic peripheral neuropathy, balance difficulty and falls. She walks with a walker. As a result, she self-discontinued her Plavix out of fear of falling/bleeding. She hasn't taken her Nexlizet in about 2 months due to no insurance coverage for this.  No angina Had EP help her resynch her monitoring device    Labwork independently reviewed: 08/2020 K 4.0, Cr  0.77, TSH wnl, Hgb 11.8 (similar to last value), Mg 1.6, LDL 127, A1C 9.4 08/2019 LFTs wnl   Past Medical History:  Diagnosis Date   Anemia 05/1985   Carotid artery disease ([place])    a. 02/2015 - 1-39% bilaterally.   Chronic back pain    Concussion    Coronary artery disease    a. s/p DES to [facility] 01/2015.   DDD (degenerative disc disease), cervical    DDD (degenerative disc disease), lumbar    Fracture of lower leg 07/2001   Right   GERD (gastroesophageal reflux disease)    Hyperlipidemia    Hypertension    Multinodular thyroid    Pneumonia ?1990's X 1   PSVT (paroxysmal supraventricular tachycardia) ([place])    a. s/p catheter ablation of a parahisian atrial tachycardia which was successfully ablated from the non-coronary cusp of the aortic root 06/2014.   RBBB with left anterior fascicular block    Renal cyst 07/10/2006   bilateral   Right knee pain 02/2010   Injected - Dr. Tonita Cong   Sinus pause    a. By event monitoring 09/2014 - BB stopped at that time.   Statin intolerance    Stroke [facility])    Syncope and collapse 07/2001   "that's when I broke my leg"   Type II diabetes mellitus ([place]) 2002    Past Surgical History:  Procedure Laterality Date   ANTERIOR CERVICAL DECOMP/DISCECTOMY FUSION  10/03/2005   C4/5; C5/6  "it's got a plate in there"   ANTERIOR LAT LUMBAR FUSION Left 02/04/2018   Procedure: Lumbar two-three  Lumbar three-four Lumbar four-five  Anterolateral decompression/percutaneous posterior arthrodesis, [place];  Surgeon: Barnett Abu, MD;  Location: [facility] OR;  Service: Neurosurgery;  Laterality: Left;   APPLICATION OF ROBOTIC ASSISTANCE FOR SPINAL PROCEDURE N/A 02/04/2018   Procedure: APPLICATION OF ROBOTIC ASSISTANCE FOR SPINAL PROCEDURE;  Surgeon: Barnett Abu, MD;  Location: [facility] OR;  Service: Neurosurgery;  Laterality: N/A;   BACK SURGERY     BREAST BIOPSY Bilateral 1968 (multiple)   "all benign"   BREAST LUMPECTOMY Bilateral 1968   CARDIAC CATHETERIZATION N/A 11/18/2014    Procedure: Left Heart Cath and Coronary Angiography;  Surgeon: Philopateer Strine M [place], MD;  Location: [facility] INVASIVE CV LAB;  Service: Cardiovascular;  Laterality: N/A;   CARDIAC CATHETERIZATION N/A 01/22/2015   Procedure: Coronary Stent Intervention;  Surgeon: Eliberto Sole M [place], MD;  Location: [facility] INVASIVE CV LAB;  Service: Cardiovascular;  Laterality: N/A;   CARDIAC CATHETERIZATION N/A 06/10/2015   Procedure: Left Heart Cath and Coronary Angiography;  Surgeon: Jhovanny Guinta M [place], MD;  Location: [facility] INVASIVE CV LAB;  Service: Cardiovascular;  Laterality: N/A;   CATARACT EXTRACTION W/ INTRAOCULAR LENS  IMPLANT, BILATERAL Bilateral 12/2010   CORONARY ANGIOPLASTY     ESOPHAGOGASTRODUODENOSCOPY  08/14/2005   gastropathy biopsy, negative   LAPAROSCOPIC CHOLECYSTECTOMY  08/1992   LOOP RECORDER INSERTION N/A 08/19/2020   Procedure: LOOP RECORDER INSERTION;  Surgeon: Marinus Maw, MD;  Location: [facility] INVASIVE CV LAB;  Service: Cardiovascular;  Laterality: N/A;   LUMBAR PERCUTANEOUS PEDICLE SCREW 3 LEVEL N/A 02/04/2018   Procedure: LUMBAR PERCUTANEOUS PEDICLE SCREW LUMBAR TWO - LUMBAR FIVE;  Surgeon: Barnett Abu, MD;  Location: [facility] OR;  Service: Neurosurgery;  Laterality: N/A;   POSTERIOR LAMINECTOMY / DECOMPRESSION LUMBAR SPINE  09/2001   due to herniated disc   SUPRAVENTRICULAR TACHYCARDIA ABLATION N/A 07/06/2014   Procedure: SUPRAVENTRICULAR TACHYCARDIA ABLATION;  Surgeon: Marinus Maw, MD;  Location: [facility] CATH LAB;  Service: Cardiovascular;  Laterality: N/A;   VAGINAL HYSTERECTOMY  1968   spotting    Current Medications: Current Meds  Medication Sig   aspirin EC 81 MG tablet Take 81 mg by mouth daily. Swallow whole.   Bempedoic Acid-Ezetimibe (NEXLIZET) 180-10 MG TABS Take 1 tablet by mouth daily.   cyanocobalamin (,VITAMIN B-12,) 1000 MCG/ML injection 1000 mcg injected IM every 14 days.   diclofenac Sodium (VOLTAREN) 1 % GEL Apply 2 g topically 4 (four) times daily as needed.   glucose blood (ONETOUCH ULTRA) test  strip USE AS INSTRUCTED TO TEST BLOOD SUGAR THREE TIMES DAILY IF NEEDED.   glyBURIDE (DIABETA) 5 MG tablet TAKE 1 TO 2 TABLETS BY MOUTH DAILY WITH BREAKFAST.   loperamide (IMODIUM) 2 MG capsule Take 2 mg by mouth 4 (four) times daily as needed for diarrhea or loose stools.    nitroGLYCERIN (NITROSTAT) 0.4 MG SL tablet PLACE 1 TABLET UNDER THE TONGUE EVERY 5 MINUTES AS NEEDED FOR CHEST PAIN FOR UP TO 3 DOSES   omeprazole (PRILOSEC) 20 MG capsule Take 1 capsule (20 mg total) by mouth 2 (two) times daily as needed (for acid reflux/indigestion.).   traMADol (ULTRAM) 50 MG tablet TAKE 1 TABLET BY MOUTH EVERY 6 HOURS AS NEEDED      Allergies:   Crestor [rosuvastatin], Ezetimibe-simvastatin, Pravastatin, Diazepam, Lyrica [pregabalin], Nsaids, Praluent [alirocumab], Repatha [evolocumab], Acetaminophen, Carvedilol, Gabapentin, Ibuprofen, Insulin glargine, Levemir [insulin detemir], Macrobid [nitrofurantoin macrocrystal], Metformin, Metoprolol tartrate, and Naproxen   Social History   Socioeconomic History   Marital status: Married    Spouse name: Not on file  Number of children: 2   Years of education: Not on file   Highest education level: Not on file  Occupational History   Occupation: Retired; Doctor, general practice, [place]    Employer: RETIRED    Comment: Works a couple days a week  Tobacco Use   Smoking status: Never   Smokeless tobacco: Never  Scientific laboratory technician Use: Never used  Substance and Sexual Activity   Alcohol use: No    Alcohol/week: 0.0 standard drinks   Drug use: No   Sexual activity: Yes  Other Topics Concern   Not on file  Social History Narrative   Widowed 2011 after 51 years of marriage (husband had C.diff)   Remarried 2015   Right handed   One story home   Occasional caffeine   Social Determinants of Radio broadcast assistant Strain: Not on file  Food Insecurity: Not on file  Transportation Needs: Not on file  Physical Activity: Not on file   Stress: Not on file  Social Connections: Not on file     Family History:  The patient's family history includes Breast cancer in her maternal aunt; Depression in her paternal aunt; Diabetes in her brother, maternal grandmother, paternal grandfather, paternal grandmother, and sister; Heart disease in her maternal grandfather and mother; Hypertension in her maternal grandfather, maternal grandmother, mother, paternal grandfather, and paternal grandmother; Myasthenia gravis in her sister; Stroke in her maternal grandmother. There is no history of Colon cancer.  ROS:   Please see the history of present illness.  All other systems are reviewed and otherwise negative.    EKGs/Labs/Other Studies Reviewed:    Studies reviewed are outlined and summarized above. Reports included below if pertinent.  2D Echo 08/19/20   1. Left ventricular ejection fraction, by estimation, is 60 to 65%. The  left ventricle has normal function. The left ventricle has no regional  wall motion abnormalities. Left ventricular diastolic parameters are  consistent with Grade II diastolic  dysfunction (pseudonormalization). Elevated left ventricular end-diastolic  pressure.   2. Right ventricular systolic function is normal. The right ventricular  size is normal. There is normal pulmonary artery systolic pressure.   3. The mitral valve is abnormal. No evidence of mitral valve  regurgitation. No evidence of mitral stenosis.   4. The aortic valve is tricuspid. There is mild calcification of the  aortic valve. Aortic valve regurgitation is not visualized. Mild aortic  valve sclerosis is present, with no evidence of aortic valve stenosis.   5. The inferior vena cava is normal in size with greater than 50%  respiratory variability, suggesting right atrial pressure of 3 mmHg.   LHC 05/2015  Prox RCA lesion, 30% stenosed. 1st Diag lesion, 35% stenosed. The left ventricular systolic function is normal.   1.  Nonobstructive CAD. Continued patency of the stent in the LCx. 2. Normal LV function.   Continue medical therapy. Consider switching Brilinta to alternative antiplatelet agent.     EKG:  EKG is not ordered today but reviewed recent tracing from hospital  Recent Labs: 08/19/2020: TSH 1.970 08/26/2020: Magnesium 1.8 11/23/2020: ALT 12; BUN 19; Creatinine, Ser 1.05; Hemoglobin 12.8; Platelets 379.0; Potassium 4.4; Sodium 142  Recent Lipid Panel    Component Value Date/Time   CHOL 130 11/23/2020 0840   CHOL 188 03/31/2019 0832   TRIG 115.0 11/23/2020 0840   HDL 36.70 (L) 11/23/2020 0840   HDL 59 03/31/2019 0832   CHOLHDL 4 11/23/2020 0840   VLDL 23.0  11/23/2020 0840   LDLCALC 70 11/23/2020 0840   LDLCALC 81 08/14/2019 1414   LDLDIRECT 168.1 02/18/2010 0751    PHYSICAL EXAM:    VS:  BP 118/76   Pulse 86   Ht 5\' 5"  (1.651 m)   Wt 154 lb (69.9 kg)   SpO2 98%   BMI 25.63 kg/m   BMI: Body mass index is 25.63 kg/m.  Affect appropriate Healthy:  appears stated age 78: normal Neck supple with no adenopathy JVP normal no bruits no thyromegaly Lungs clear with no wheezing and good diaphragmatic motion Heart:  S1/S2 no murmur, no rub, gallop or click PMI normal Abdomen: benighn, BS positve, no tenderness, no AAA no bruit.  No HSM or HJR Distal pulses intact with no bruits No edema Neuro non-focal Skin warm and dry No muscular weakness   Wt Readings from Last 3 Encounters:  04/19/21 154 lb (69.9 kg)  12/23/20 155 lb (70.3 kg)  12/09/20 155 lb (70.3 kg)     ASSESSMENT & PLAN:   1. Stroke:  cryptogenic no PAF on Linq carotids ok If she will not take DAT favor having her take plavix alone rather than ASA alone   2. CAD :  stable no angina patent LCX stent cath 2017 non ischemic myovue 01/21/2018  3. History of SVT - quiescent. Will be monitored by ILR now.   4. HLD:  discussed compliance with Nexlizet   5. DM:  Discussed low carb diet.  Target hemoglobin A1c is 6.5  or less.  Continue current medications.   Disposition: F/u in a year   Medication Adjustments/Labs and Tests Ordered: Current medicines are reviewed at length with the patient today.  Concerns regarding medicines are outlined above. Medication changes, Labs and Tests ordered today are summarized above and listed in the Patient Instructions accessible in Encounters.   Signed, Jenkins Rouge, MD  04/19/2021 1:47 PM    [facility] [address], [place], [place]  [address] Phone: [phone]; Fax: [phone]

## 2021-04-18 ENCOUNTER — Ambulatory Visit: Payer: Medicare PPO

## 2021-04-19 ENCOUNTER — Ambulatory Visit: Payer: Medicare PPO | Admitting: Cardiovascular Disease

## 2021-04-19 ENCOUNTER — Encounter: Payer: Self-pay | Admitting: Cardiovascular Disease

## 2021-04-19 ENCOUNTER — Other Ambulatory Visit: Payer: Self-pay

## 2021-04-19 VITALS — BP 118/76 | HR 86 | Ht 65.0 in | Wt 154.0 lb

## 2021-04-19 DIAGNOSIS — I10 Essential (primary) hypertension: Secondary | ICD-10-CM

## 2021-04-19 DIAGNOSIS — Z8673 Personal history of transient ischemic attack (TIA), and cerebral infarction without residual deficits: Secondary | ICD-10-CM

## 2021-04-19 DIAGNOSIS — E782 Mixed hyperlipidemia: Secondary | ICD-10-CM | POA: Diagnosis not present

## 2021-04-19 DIAGNOSIS — I2510 Atherosclerotic heart disease of native coronary artery without angina pectoris: Secondary | ICD-10-CM | POA: Diagnosis not present

## 2021-04-19 NOTE — Patient Instructions (Signed)
Medication Instructions:  Your physician recommends that you continue on your current medications as directed. Please refer to the Current Medication list given to you today.  *If you need a refill on your cardiac medications before your next appointment, please call your pharmacy*   Lab Work: NONE If you have labs (blood work) drawn today and your tests are completely normal, you will receive your results only by: MyChart Message (if you have MyChart) OR A paper copy in the mail If you have any lab test that is abnormal or we need to change your treatment, we will call you to review the results.   Testing/Procedures: NONE   Follow-Up: At [facility], you and your health needs are our priority.  As part of our continuing mission to provide you with exceptional heart care, we have created designated Provider Care Teams.  These Care Teams include your primary Cardiologist (physician) and Advanced Practice Providers (APPs -  Physician Assistants and Nurse Practitioners) who all work together to provide you with the care you need, when you need it.  We recommend signing up for the patient portal called "MyChart".  Sign up information is provided on this After Visit Summary.  MyChart is used to connect with patients for Virtual Visits (Telemedicine).  Patients are able to view lab/test results, encounter notes, upcoming appointments, etc.  Non-urgent messages can be sent to your provider as well.   To learn more about what you can do with MyChart, go to [email or web address].    Your next appointment:   6 month(s)  The format for your next appointment:   In Person  Provider:   Peter [name], MD {   

## 2021-04-26 ENCOUNTER — Telehealth: Payer: Self-pay

## 2021-04-26 DIAGNOSIS — E1159 Type 2 diabetes mellitus with other circulatory complications: Secondary | ICD-10-CM

## 2021-04-26 NOTE — Telephone Encounter (Signed)
Patient requesting new rx for test strips. Contacted patient's pharmacy to see if rx was on file. One Touch Ultra is no longer covered by her insurance. They need a new rx for Accu Chek glucometer and test strips.   Dr. Para March, please sign off if all looks good.

## 2021-04-27 ENCOUNTER — Telehealth: Payer: Self-pay | Admitting: Family Medicine

## 2021-04-27 MED ORDER — GLUCOSE BLOOD VI STRP: ORAL_STRIP | 12 refills | Status: DC

## 2021-04-27 MED ORDER — ACCU-CHEK AVIVA PLUS W/DEVICE KIT: 1.0000 | PACK | 0 refills | Status: DC

## 2021-04-27 NOTE — Telephone Encounter (Signed)
Sent.  Agreed.  Thanks.

## 2021-04-27 NOTE — Chronic Care Management (AMB) (Signed)
°  Chronic Care Management   Outreach Note  04/27/2021 Name: SUANN KLIER MRN: [id number] DOB: [birth year]  Referred by: Joaquim Nam, MD Reason for referral : No chief complaint on file.   An unsuccessful telephone outreach was attempted today. The patient was referred to the pharmacist for assistance with care management and care coordination.   Follow Up Plan:   Tatjana Dellinger Upstream Scheduler

## 2021-04-29 ENCOUNTER — Telehealth: Payer: Self-pay | Admitting: Family Medicine

## 2021-04-29 NOTE — Progress Notes (Signed)
°  Chronic Care Management   Note  04/29/2021 Name: Maria [name] MRN: [id number] DOB: 01/25/1941  Maria [name] is a 80 y.o. year old female who is a primary care patient of Joaquim Nam, MD. I reached out to [name] by phone today in response to a referral sent by Maria [name]'s PCP, Joaquim Nam, MD.   Maria [name] was given information about Chronic Care Management services today including:  CCM service includes personalized support from designated clinical staff supervised by her physician, including individualized plan of care and coordination with other care providers 24/7 contact phone numbers for assistance for urgent and routine care needs. Service will only be billed when office clinical staff spend 20 minutes or more in a month to coordinate care. Only one practitioner may furnish and bill the service in a calendar month. The patient may stop CCM services at any time (effective at the end of the month) by phone call to the office staff.   Patient agreed to services and verbal consent obtained.   Follow up plan: PT AWARE $10 COPAY   Tatjana Dellinger Upstream Scheduler

## 2021-05-03 ENCOUNTER — Ambulatory Visit: Payer: Medicare PPO | Admitting: Family Medicine

## 2021-05-03 ENCOUNTER — Other Ambulatory Visit: Payer: Self-pay

## 2021-05-03 ENCOUNTER — Encounter: Payer: Self-pay | Admitting: Family Medicine

## 2021-05-03 VITALS — BP 140/82 | HR 85 | Temp 97.8°F | Ht 65.0 in | Wt 156.0 lb

## 2021-05-03 DIAGNOSIS — Z23 Encounter for immunization: Secondary | ICD-10-CM | POA: Diagnosis not present

## 2021-05-03 DIAGNOSIS — M792 Neuralgia and neuritis, unspecified: Secondary | ICD-10-CM

## 2021-05-03 DIAGNOSIS — E538 Deficiency of other specified B group vitamins: Secondary | ICD-10-CM | POA: Diagnosis not present

## 2021-05-03 DIAGNOSIS — E1159 Type 2 diabetes mellitus with other circulatory complications: Secondary | ICD-10-CM

## 2021-05-03 LAB — POCT GLYCOSYLATED HEMOGLOBIN (HGB A1C): Hemoglobin A1C: 5.9 % — AB (ref 4.0–5.6)

## 2021-05-03 MED ORDER — GLYBURIDE 5 MG PO TABS
2.5000 mg | ORAL_TABLET | Freq: Two times a day (BID) | ORAL | Status: DC
Start: 2021-05-03 — End: 2021-05-31

## 2021-05-03 MED ORDER — CYANOCOBALAMIN 1000 MCG/ML IJ SOLN
1000.0000 ug | Freq: Once | INTRAMUSCULAR | Status: AC
Start: 2021-05-03 — End: 2021-05-03
  Administered 2021-05-03: 12:00:00 1000 ug via INTRAMUSCULAR

## 2021-05-03 MED ORDER — OXYCODONE HCL 5 MG PO TABS: 5.0000 mg | ORAL_TABLET | Freq: Four times a day (QID) | ORAL | 0 refills | Status: DC | PRN

## 2021-05-03 NOTE — Patient Instructions (Addendum)
Ask the front about getting a B12 shot done every 2 weeks at [facility]  [address].    If you have any lows at night, then skip or stop the evening dose of glyburide.    Plan on recheck in about 6 months at a yearly visit.    Take care.  Glad to see you.  Check with your insurance to see if they will cover the shingles shot.

## 2021-05-03 NOTE — Progress Notes (Signed)
This visit occurred during the SARS-CoV-2 public health emergency.  Safety protocols were in place, including screening questions prior to the visit, additional usage of staff PPE, and extensive cleaning of exam room while observing appropriate contact time as indicated for disinfecting solutions.  Diabetes:  Using medications without difficulties: she had a lower sugar at night recently but that was atypical.  Usually ~100 in the AMs.   Hypoglycemic episodes: see above, cautions d/w pt.   Hyperglycemic episodes: no Feet problems: no tingling.   Blood Sugars averaging: ~100.   eye exam within last year: d/w pt.  She'll call about an appointment.   A1c 5.9.  d/w pt at OV.    She is still putting up with pain at baseline.  She has used oxycodone and that helps with the pain.  She uses it rarely.  Refill sent.  Not sedated with med use.    B12 and flu shot today at OV.  See avs.    Meds, vitals, and allergies reviewed.   ROS: Per HPI unless specifically indicated in ROS section   GEN: nad, alert and oriented HEENT: ncat NECK: supple w/o LA CV: rrr. PULM: ctab, no inc wob ABD: soft, +bs EXT: no edema SKIN: no acute rash

## 2021-05-04 NOTE — Assessment & Plan Note (Signed)
B12 injection done today.  See after visit summary.

## 2021-05-04 NOTE — Assessment & Plan Note (Signed)
A1c clearly improved.  If any lows at night, then skip or stop the evening dose of glyburide.  Otherwise continue glyburide as is for now Plan on recheck in about 6 months at a yearly visit.   She agrees with plan.

## 2021-05-04 NOTE — Assessment & Plan Note (Signed)
She uses oxycodone rarely.  Prescription sent.  Not sedated.  It helps with pain.

## 2021-05-05 DIAGNOSIS — C44319 Basal cell carcinoma of skin of other parts of face: Secondary | ICD-10-CM | POA: Diagnosis not present

## 2021-05-05 DIAGNOSIS — D485 Neoplasm of uncertain behavior of skin: Secondary | ICD-10-CM | POA: Diagnosis not present

## 2021-05-05 DIAGNOSIS — C44529 Squamous cell carcinoma of skin of other part of trunk: Secondary | ICD-10-CM | POA: Diagnosis not present

## 2021-05-23 ENCOUNTER — Ambulatory Visit (INDEPENDENT_AMBULATORY_CARE_PROVIDER_SITE_OTHER): Payer: Medicare PPO

## 2021-05-23 DIAGNOSIS — Z8673 Personal history of transient ischemic attack (TIA), and cerebral infarction without residual deficits: Secondary | ICD-10-CM

## 2021-05-23 LAB — CUP PACEART REMOTE DEVICE CHECK
Date Time Interrogation Session: 20230108230954
Implantable Pulse Generator Implant Date: 20220407

## 2021-05-31 ENCOUNTER — Other Ambulatory Visit: Payer: Self-pay | Admitting: Family Medicine

## 2021-05-31 NOTE — Progress Notes (Signed)
Carelink Summary Report / Loop Recorder 

## 2021-06-07 DIAGNOSIS — C44319 Basal cell carcinoma of skin of other parts of face: Secondary | ICD-10-CM | POA: Diagnosis not present

## 2021-06-16 ENCOUNTER — Telehealth: Payer: Self-pay

## 2021-06-16 NOTE — Progress Notes (Addendum)
Chronic Care Management Pharmacy Assistant   Name: Maria [name]  MRN: [id number] DOB: August 19, 1940  Reason for Encounter: CCM (Initial Questions)   Recent office visits:  05/03/2021 - Maria Stain, MD - Patient presented for diabetes. Labs: A1c. Change: aspirin EC 81 MG tablet. Change: glyBURIDE (DIABETA) 5 MG tablet. Stop: clopidogrel (PLAVIX) 75 MG tablet. Stop: feeding supplement (ENSURE ENLIVE / ENSURE PLUS) LIQD. Stop: sulfamethoxazole-trimethoprim (BACTRIM DS) 800-160 MG tablet. Administered: cyanocobalamin ((VITAMIN B-12)) injection 1,000 mcg.  01/13/2021 - Maria Stain, MD - Telephone - Patient presented for burn upon urination. Ordered: Urinalysis and Urine culture. Start: sulfamethoxazole-trimethoprim (BACTRIM DS) 800-160 MG tablet.  12/23/2020 - Maria Stain, MD - Video Visit - Patient presented for Covid. Start: molnupiravir EUA 200 mg CAPS.   Recent consult visits:  04/26/2021 - Maria [name], [facility] - Telephone - Accu Chek Glucometer and test strips ordered. 04/19/2021 - Maria Rouge, MD - Cardiology - Patient presented for history of stroke. No medication changes.   Hospital visits:  None in previous 6 months  Medications: Outpatient Encounter Medications as of 06/16/2021  Medication Sig   aspirin EC 81 MG tablet Take 81 mg by mouth daily. Swallow whole.   Bempedoic Acid-Ezetimibe (NEXLIZET) 180-10 MG TABS Take 1 tablet by mouth daily.   Blood Glucose Monitoring Suppl (ACCU-CHEK AVIVA PLUS) w/Device KIT 1 kit by Does not apply route as directed.   cyanocobalamin (,VITAMIN B-12,) 1000 MCG/ML injection 1000 mcg injected IM every 14 days.   diclofenac Sodium (VOLTAREN) 1 % GEL Apply 2 g topically 4 (four) times daily as needed.   glucose blood test strip Use as instructed, twice daily to check blood glucose.  Dx E11.9.   glyBURIDE (DIABETA) 5 MG tablet TAKE 1 TO 2 TABLETS BY MOUTH DAILY WITH BREAKFAST.   loperamide (IMODIUM) 2 MG capsule Take 2 mg by mouth 4 (four) times  daily as needed for diarrhea or loose stools.    nitroGLYCERIN (NITROSTAT) 0.4 MG SL tablet PLACE 1 TABLET UNDER THE TONGUE EVERY 5 MINUTES AS NEEDED FOR CHEST PAIN FOR UP TO 3 DOSES   omeprazole (PRILOSEC) 20 MG capsule Take 1 capsule (20 mg total) by mouth 2 (two) times daily as needed (for acid reflux/indigestion.).   oxyCODONE (OXY IR/ROXICODONE) 5 MG immediate release tablet Take 1 tablet (5 mg total) by mouth every 6 (six) hours as needed for moderate pain.   traMADol (ULTRAM) 50 MG tablet TAKE 1 TABLET BY MOUTH EVERY 6 HOURS AS NEEDED   No facility-administered encounter medications on file as of 06/16/2021.   Lab Results  Component Value Date/Time   HGBA1C 5.9 (A) 05/03/2021 11:33 AM   HGBA1C 6.9 (H) 11/23/2020 08:40 AM   HGBA1C 9.4 (H) 08/19/2020 06:06 AM   MICROALBUR 0.5 02/16/2009 09:31 AM   MICROALBUR 1.9 01/06/2008 09:47 AM    BP Readings from Last 3 Encounters:  05/03/21 140/82  04/19/21 118/76  12/09/20 128/72   Patient contacted to review initial questions prior to visit with Maria [name].  Have you seen any other providers since your last visit with PCP? No  Any changes in your medications or health? Yes Change: aspirin EC 81 MG tablet.  Change: glyBURIDE (DIABETA) 5 MG tablet.  Stop: clopidogrel (PLAVIX) 75 MG tablet.  Stop: feeding supplement (ENSURE ENLIVE / ENSURE PLUS) LIQD.  Stop: sulfamethoxazole-trimethoprim (BACTRIM DS) 800-160 MG tablet.  Any side effects from any medications? No  Do you have an symptoms or problems not managed by your medications? No  Any concerns about your health right now? No  Has your provider asked that you check blood pressure, blood sugar, or follow special diet at home? Yes Patient checks her blood sugar at home. Patient has not been logging it. I asked patient to take her blood glucose reading daily starting tomorrow until the phone call with Maria [name]. I advised patient that Sharyn Lull would ask for her log during their  appointment. Patient agreed.   Do you get any type of exercise on a regular basis? Yes - Patient walks around the house everyday. Patient uses a walker with wheels for stability.   Can you think of a goal you would like to reach for your health? No  Do you have any problems getting your medications? No  Is there anything that you would like to discuss during the appointment? No  Spoke with patient and reminded them to have all medications, supplements and any blood glucose and blood pressure readings available for review with pharmacist, at their telephone visit on 06/21/2021 at 11:00 am.    Star Rating Drugs:  Medication:  Last Fill: Day Supply No star rating drugs noted  Care Gaps: Annual wellness visit in last year? No 08/14/2019 Most Recent BP reading: 140/82 on 05/03/2021  If Diabetic: Most recent A1C reading: 5.9 05/03/2021 Last eye exam / retinopathy screening: 07/14/2019 Last diabetic foot exam: Up to date  Maria [name], CPP notified  Maria [name], [place] Assistant [phone]  I have reviewed the care management and care coordination activities outlined in this encounter and I am certifying that I agree with the content of this note. No further action required.  Maria [name], PharmD Clinical Pharmacist [facility] [phone]

## 2021-06-20 NOTE — Progress Notes (Signed)
Chronic Care Management Pharmacy Note  06/21/2021 Name:  Maria [name] MRN:  [id number] DOB:  [birth year]  Summary: Initial CCM visit. Reviewed medication adherence. No barriers identified. Reviewed DM, A1c 5.9% on glyburide 5 mg 1/2 tablet BID. Denies hypoglycemia. Fasting BG 125-135 this week. HTN, stable off medications. Checks at home occasionally. CAD/HLD, LDL 70 on Nexlizet, aspirin. GERD, rare use of omeprazole PRN. Chronic back pain, rare use of oxycodone before outings. Neuropathy, uses diclofenac gel PRN. Due for next PCP visit June 2023, reminded patient.  Recommendations/Changes made from today's visit: None  Plan: - CCM PharmD follow up 12 months - CCM HC diabetes review 6 months   Subjective: Maria [name] is an 81 y.o. year old female who is a primary patient of Damita Dunnings, Elveria Rising, MD.  The CCM team was consulted for assistance with disease management and care coordination needs.    Engaged with patient by telephone for initial visit in response to provider referral for pharmacy case management and/or care coordination services.   Consent to Services:  The patient was given the following information about Chronic Care Management services today, agreed to services, and gave verbal consent: 1. CCM service includes personalized support from designated clinical staff supervised by the primary care provider, including individualized plan of care and coordination with other care providers 2. 24/7 contact phone numbers for assistance for urgent and routine care needs. 3. Service will only be billed when office clinical staff spend 20 minutes or more in a month to coordinate care. 4. Only one practitioner may furnish and bill the service in a calendar month. 5.The patient may stop CCM services at any time (effective at the end of the month) by phone call to the office staff. 6. The patient will be responsible for cost sharing (co-pay) of up to 20% of the service fee (after annual  deductible is met). Patient agreed to services and consent obtained.  Patient Care Team: Tonia Ghent, MD as PCP - General Josue Hector, MD as PCP - Cardiology (Cardiology) Kristeen Miss, MD as Consulting Physician (Neurosurgery) Debbora Dus, [facility] (Pharmacist)  Recent office visits: 05/03/2021 - Elsie Stain, MD - Patient presented for diabetes. A1c 5.9%. If any lows at night, then skip or stop the evening dose of glyburide.  Otherwise continue glyburide as is for now. B12 injection. Follow up 6 months. 01/13/2021 - Elsie Stain, MD - Telephone - Patient presented for burn upon urination. Ordered: Urinalysis and Urine culture. Start: sulfamethoxazole-trimethoprim (BACTRIM DS) 800-160 MG tablet.  12/23/2020 - Elsie Stain, MD - Video Visit - Patient presented for Covid. Start: molnupiravir EUA 200 mg CAPS.    Recent consult visits:  04/26/2021 - Debbora Dus, [facility] - Telephone - Accu Chek Glucometer and test strips ordered. 04/19/2021 - Jenkins Rouge, MD - Cardiology - Patient presented for history of stroke. No medication changes.    Hospital visits:  None in previous 6 months  Objective:  Lab Results  Component Value Date   CREATININE 1.05 11/23/2020   BUN 19 11/23/2020   GFR 49.90 (L) 11/23/2020   EGFR 57 (L) 08/26/2020   GFRNONAA >60 08/19/2020   GFRAA >60 10/03/2018   NA 142 11/23/2020   K 4.4 11/23/2020   CALCIUM 9.6 11/23/2020   CO2 26 11/23/2020   GLUCOSE 104 (H) 11/23/2020    Lab Results  Component Value Date/Time   HGBA1C 5.9 (A) 05/03/2021 11:33 AM   HGBA1C 6.9 (H) 11/23/2020 08:40 AM   HGBA1C 9.4 (H)  08/19/2020 06:06 AM   GFR 49.90 (L) 11/23/2020 08:40 AM   GFR 60.19 07/16/2020 11:31 AM   MICROALBUR 0.5 02/16/2009 09:31 AM   MICROALBUR 1.9 01/06/2008 09:47 AM    Last diabetic Eye exam:  Lab Results  Component Value Date/Time   HMDIABEYEEXA No Retinopathy 07/09/2019 12:00 AM    Last diabetic Foot exam:  Lab Results  Component Value Date/Time    HMDIABFOOTEX yes 06/13/2010 12:00 AM     Lab Results  Component Value Date   CHOL 130 11/23/2020   HDL 36.70 (L) 11/23/2020   LDLCALC 70 11/23/2020   LDLDIRECT 168.1 02/18/2010   TRIG 115.0 11/23/2020   CHOLHDL 4 11/23/2020    Hepatic Function Latest Ref Rng & Units 11/23/2020 08/26/2020 08/14/2019  Total Protein 6.0 - 8.3 g/dL 6.7 6.6 6.8  Albumin 3.5 - 5.2 g/dL 4.4 4.3 -  AST 0 - 37 U/L 19 11 17   ALT 0 - 35 U/L 12 12 11   Alk Phosphatase 39 - 117 U/L 81 100 -  Total Bilirubin 0.2 - 1.2 mg/dL 0.7 0.4 0.6  Bilirubin, Direct 0.00 - 0.40 mg/dL - 0.14 -    Lab Results  Component Value Date/Time   TSH 1.970 08/19/2020 06:06 AM   TSH 2.13 06/13/2018 12:22 PM   TSH 2.98 07/05/2016 04:02 PM   FREET4 0.8 02/16/2009 09:31 AM   FREET4 0.9 01/06/2008 09:47 AM    CBC Latest Ref Rng & Units 11/23/2020 08/19/2020 08/18/2020  WBC 4.0 - 10.5 K/uL 7.8 9.5 9.4  Hemoglobin 12.0 - 15.0 g/dL 12.8 11.8(L) 11.4(L)  Hematocrit 36.0 - 46.0 % 37.2 35.0(L) 33.5(L)  Platelets 150.0 - 400.0 K/uL 379.0 345 372    Lab Results  Component Value Date/Time   VD25OH 32 02/16/2009 09:59 PM    Clinical ASCVD: Yes  The ASCVD Risk score (Arnett DK, et al., 2019) failed to calculate for the following reasons:   The 2019 ASCVD risk score is only valid for ages 50 to 74   The patient has a prior MI or stroke diagnosis    Depression screen [facility] 2/9 05/03/2021 04/23/2020 08/14/2019  Decreased Interest 0 0 0  Down, Depressed, Hopeless 0 0 0  PHQ - 2 Score 0 0 0  Altered sleeping 0 - 0  Tired, decreased energy 1 - 0  Change in appetite 0 - 0  Feeling bad or failure about yourself  0 - 0  Trouble concentrating 0 - 0  Moving slowly or fidgety/restless 0 - 0  Suicidal thoughts 0 - 0  PHQ-9 Score 1 - 0  Difficult doing work/chores Not difficult at all - Not difficult at all  Some recent data might be hidden    Social History   Tobacco Use  Smoking Status Never  Smokeless Tobacco Never   BP Readings from  Last 3 Encounters:  05/03/21 140/82  04/19/21 118/76  12/09/20 128/72   Pulse Readings from Last 3 Encounters:  05/03/21 85  04/19/21 86  12/09/20 84   Wt Readings from Last 3 Encounters:  05/03/21 156 lb (70.8 kg)  04/19/21 154 lb (69.9 kg)  12/23/20 155 lb (70.3 kg)   BMI Readings from Last 3 Encounters:  05/03/21 25.96 kg/m  04/19/21 25.63 kg/m  12/23/20 25.79 kg/m    Assessment/Interventions: Review of patient past medical history, allergies, medications, health status, including review of consultants reports, laboratory and other test data, was performed as part of comprehensive evaluation and provision of chronic care management services.  SDOH:  (Social Determinants of Health) assessments and interventions performed: Yes SDOH Interventions    Flowsheet Row Most Recent Value  SDOH Interventions   Financial Strain Interventions Intervention Not Indicated  Housing Interventions Intervention Not Indicated  Transportation Interventions Intervention Not Indicated      SDOH Screenings   Alcohol Screen: Not on file  Depression (PHQ2-9): Low Risk    PHQ-2 Score: 1  Financial Resource Strain: Low Risk    Difficulty of Paying Living Expenses: Not very hard  Food Insecurity: Not on file  Housing: [place] Risk Score: 0  Physical Activity: Not on file  Social Connections: Not on file  Stress: Not on file  Tobacco Use: Low Risk    Smoking Tobacco Use: Never   Smokeless Tobacco Use: Never   Passive Exposure: Not on file  Transportation Needs: No Transportation Needs   Lack of Transportation (Medical): No   Lack of Transportation (Non-Medical): No    CCM Care Plan  Allergies  Allergen Reactions   Crestor [Rosuvastatin] Other (See Comments)    MYALGIAS   Ezetimibe-Simvastatin Other (See Comments)    MYALGIAS   Pravastatin Other (See Comments)    Myalgias   Diazepam     Balance difficulty.     Lyrica [Pregabalin]     swelling   Nsaids      UNSPECIFIED REACTION    Praluent [Alirocumab]     myalgias   Repatha [Evolocumab]     Hoarse voice   Acetaminophen Anxiety and Other (See Comments)    REACTION: jittery with plain tylenol, but tolerated tylenol/benadryl combination   Carvedilol Other (See Comments)    nightmares   Gabapentin Diarrhea   Ibuprofen Other (See Comments)    REACTION: GI upset at high doses   Insulin Glargine Rash    Rash   Levemir [Insulin Detemir] Rash   Macrobid [Nitrofurantoin Macrocrystal] Nausea And Vomiting and Other (See Comments)    ACHES   Metformin Nausea And Vomiting    REACTION: GI upset   Metoprolol Tartrate Other (See Comments)    REACTION: nightmares   Naproxen Nausea And Vomiting    REACTION: GI upset    Medications Reviewed Today     Reviewed by Debbora Dus, [facility] (Pharmacist) on 06/21/21 at 1131  Med List Status: <None>   Medication Order Taking? Sig Documenting Provider Last Dose Status Informant  aspirin EC 81 MG tablet [id number] Yes Take 81 mg by mouth daily. Swallow whole. [provider] Taking Active   Bempedoic Acid-Ezetimibe (NEXLIZET) 180-10 MG TABS [id number] Yes Take 1 tablet by mouth daily. Josue Hector, MD Taking Active   Blood Glucose Monitoring Suppl (ACCU-CHEK AVIVA PLUS) w/Device KIT [id number] Yes 1 kit by Does not apply route as directed. Tonia Ghent, MD Taking Active   cyanocobalamin (,VITAMIN B-12,) 1000 MCG/ML injection [id number] Yes 1000 mcg injected IM every 14 days. Tonia Ghent, MD Taking Active Self  diclofenac Sodium (VOLTAREN) 1 % GEL [id number] Yes Apply 2 g topically 4 (four) times daily as needed. Tonia Ghent, MD Taking Active   glucose blood test strip [id number] Yes Use as instructed, twice daily to check blood glucose.  Dx E11.9. Tonia Ghent, MD Taking Active   glyBURIDE (DIABETA) 5 MG tablet [id number] Yes TAKE 1 TO 2 TABLETS BY MOUTH DAILY WITH BREAKFAST. Tonia Ghent, MD Taking Active   loperamide (IMODIUM) 2 MG  capsule [id number] Yes Take 2 mg by mouth 4 (four) times  daily as needed for diarrhea or loose stools.  [provider] Taking Active Self  nitroGLYCERIN (NITROSTAT) 0.4 MG SL tablet [id number] Yes PLACE 1 TABLET UNDER THE TONGUE EVERY 5 MINUTES AS NEEDED FOR CHEST PAIN FOR UP TO 3 DOSES Josue Hector, MD Taking Active   omeprazole (PRILOSEC) 20 MG capsule [id number] Yes Take 1 capsule (20 mg total) by mouth 2 (two) times daily as needed (for acid reflux/indigestion.). Tonia Ghent, MD Taking Active Self  oxyCODONE (OXY IR/ROXICODONE) 5 MG immediate release tablet [id number] Yes Take 1 tablet (5 mg total) by mouth every 6 (six) hours as needed for moderate pain. Tonia Ghent, MD Taking Active   traMADol (ULTRAM) 50 MG tablet [id number] Yes TAKE 1 TABLET BY MOUTH EVERY 6 HOURS AS NEEDED Tonia Ghent, MD Taking Active             Patient Active Problem List   Diagnosis Date Noted   COVID 12/26/2020   Chronic pain 10/24/2020   Stroke ([place]) 08/19/2020   Hypokalemia 08/19/2020   Hyperglycemia 08/19/2020   Healthcare maintenance 08/24/2019   Advance care planning 08/24/2019   Hand pain 09/05/2018   Weakness 06/17/2018   B12 deficiency 06/16/2018   Neuropathy 06/13/2018   Neuropathic pain 05/19/2018   Edema 04/24/2018   Lumbosacral radiculopathy at L4 02/04/2018   Right sided sciatica 10/02/2017   Neck pain 01/02/2017   Bruising 09/19/2015   Dysuria 06/15/2015   Carotid artery disease ([place])    Coronary atherosclerosis of native coronary artery 01/22/2015   Coronary artery disease involving native coronary artery of native heart without angina pectoris 01/22/2015   Chest pain 11/18/2014   Sinus node dysfunction (HCC) 11/02/2014   SVT (supraventricular tachycardia) ([place]) 07/06/2014   ETD (eustachian tube dysfunction) 05/21/2014   Benign paroxysmal positional vertigo 05/21/2014   Weight loss 06/09/2012   Functional diarrhea 02/01/2012   Type 2 diabetes mellitus with  vascular disease ([place]) 02/13/2011   Shoulder pain, left 09/12/2010   ADJUSTMENT REACTION, ADULT 05/17/2010   KNEE PAIN, RIGHT 10/01/2009   SYNCOPE 02/22/2009   PSVT 11/12/2008   GERD 11/12/2008   Renal cyst 11/12/2008   Backache 11/12/2008   Multiple thyroid nodules 08/07/2007   Reflux esophagitis 08/07/2007   Hyperlipidemia 08/21/2006   ANEMIA-NOS 08/21/2006   DEPRESSION 08/21/2006   Essential hypertension 08/21/2006   DEGENERATIVE [address] DISEASE, CERVICAL SPINE 08/21/2006   DEGENERATIVE DISC DISEASE, LUMBAR SPINE 08/21/2006    Immunization History  Administered Date(s) Administered   Fluad Quad(high Dose 65+) 05/03/2021   Influenza Split 02/22/2012   Influenza Whole 02/21/2006, 02/13/2007, 02/07/2008, 02/17/2009, 02/25/2010   Influenza, High Dose Seasonal PF 03/01/2017   Influenza,inj,Quad PF,6+ Mos 01/23/2015, 03/29/2016, 04/23/2018, 01/09/2019   Influenza,inj,quad, With Preservative 02/12/2017   Influenza-Unspecified 02/26/2014, 02/13/2016   PFIZER(Purple Top)SARS-COV-2 Vaccination 07/10/2019, 07/30/2019, 05/04/2020   Pneumococcal Conjugate-13 12/18/2013   Pneumococcal Polysaccharide-23 07/17/2000, 02/11/2015   Pneumococcal-Unspecified 07/14/2015   Td 05/15/1996, 01/15/2008   Tdap 02/22/2017   Zoster, Live 10/02/2011    Conditions to be addressed/monitored:  Hypertension, Hyperlipidemia, and Diabetes  Care Plan : [place]  Updates made by Debbora Dus, [place] since 06/21/2021 12:00 AM     Problem: CHL AMB "PATIENT-SPECIFIC PROBLEM"      Long-Range Goal: Disease Management   Start Date: 06/21/2021  Priority: High  Note:   Current Barriers:  None identified  Pharmacist Clinical Goal(s):  Patient will contact provider office for questions/concerns as evidenced notation of same in electronic health record  through collaboration with PharmD and provider.   Interventions: 1:1 collaboration with Tonia Ghent, MD regarding development and update of  comprehensive plan of care as evidenced by provider attestation and co-signature Inter-disciplinary care team collaboration (see longitudinal plan of care) Comprehensive medication review performed; medication list updated in electronic medical record  Hypertension (BP goal <140/90) -Controlled, per clinic readings -Current treatment: None  -Medications previously tried: losartan (d/c due to concern for over-treatment), carvedilol, metoprolol  -Current home readings: Spouse has checked at home recently and reports BP was very good. She does not recall range.  -Denies hypotensive/hypertensive symptoms -Educated on BP goals and benefits of medications for prevention of heart attack, stroke and kidney damage; -Counseled to monitor BP at home 1-2 times monthly, document, and provide log at future appointments -Recommended continue monitoring at home on occasion. Let us know if BP consistently > 140/90.  Hyperlipidemia: (LDL goal < 70) -Controlled, LDL 70 -Current treatment: Nexlizet 180-10 mg - 1 tablet daily -Medications previously tried: multiple intolerances  -Educated on Cholesterol goals;  -Recommended to continue current medication  Diabetes (A1c goal <7%) -Controlled, A1c 5.9%, pt denies any hypoglycemia. She states the glyburide works very well for her.  -Current medications: Glyburide 5 mg - take 1-2 tablet daily (pt taking differently: takes 1/2 tablet (2/5 mg) BID) Accu Chek Aviva Plus -Medications previously tried: metformin   -Current home glucose readings fasting glucose: 2/3 - 2/7 - 137, 130, 137, 126  post prandial glucose: none reported -Denies hypoglycemic/hyperglycemic symptoms -Current meal patterns: tries to limit carbs breakfast: scrambled egg sandwich or cheese toast drinks: orange juice and cranberry juice daily -Current exercise: limited mobility -Educated on A1c and blood sugar goals; -Counseled to check feet daily and get yearly eye exams - eye exam due  (patient is scheduling soon) -Recommended to continue current medication; Schedule eye exam.  GERD (Goal: Improve acid-reflux symptoms) -Controlled, per patient report - only used 1 in past month -Current treatment  Omeprazole 20 mg - 1 BID PRN -Medications previously tried: none   -Recommended to continue current medication  Patient Goals/Self-Care Activities Patient will:  - take medications as prescribed as evidenced by patient report and record review  Follow Up Plan: Telephone follow up appointment with care management team member scheduled for: - CCM PharmD follow up 12 months - CCM HC diabetes review 6 months    Medication Assistance: None required.  Patient affirms current coverage meets needs.  Compliance/Adherence/Medication fill history: Care Gaps: Eye exam due - pt aware and will schedule   [facility] Drugs: Glyburide 5 mg  Filled 05/31/2021 30 DS Nexlizet 180-10 mg  Filled 03/28/2021 90 DS  Patient's preferred pharmacy is:  [facility], [place] [address] Phone: [phone] Fax: [place], [place] [address] [place] [address] Phone: [phone] Fax: [phone]  Uses pill box? No - Keeps them organized in bag Pt endorses 100% compliance  We discussed: Benefits of medication synchronization, packaging and delivery as well as enhanced pharmacist oversight with [employer]. Patient decided to: Continue current medication management strategy  Care Plan and Follow Up Patient Decision:  Patient agrees to Care Plan and Follow-up.  Debbora Dus, PharmD Clinical Pharmacist Practitioner [facility] at [facility] [phone]

## 2021-06-21 ENCOUNTER — Ambulatory Visit (INDEPENDENT_AMBULATORY_CARE_PROVIDER_SITE_OTHER): Payer: Medicare PPO

## 2021-06-21 ENCOUNTER — Other Ambulatory Visit: Payer: Self-pay

## 2021-06-21 ENCOUNTER — Telehealth: Payer: Self-pay

## 2021-06-21 DIAGNOSIS — E1159 Type 2 diabetes mellitus with other circulatory complications: Secondary | ICD-10-CM

## 2021-06-21 DIAGNOSIS — I10 Essential (primary) hypertension: Secondary | ICD-10-CM

## 2021-06-21 DIAGNOSIS — E782 Mixed hyperlipidemia: Secondary | ICD-10-CM

## 2021-06-21 MED ORDER — OXYCODONE HCL 5 MG PO TABS: 5.0000 mg | ORAL_TABLET | Freq: Four times a day (QID) | ORAL | 0 refills | Status: DC | PRN

## 2021-06-21 NOTE — Telephone Encounter (Signed)
Sent. Thanks.  Please schedule yearly visit for ~June 2023.

## 2021-06-21 NOTE — Telephone Encounter (Signed)
Pt down to 1 pill oxycodone. Uses very sparingly for back pain. Requesting refill to [place] [facility].

## 2021-06-21 NOTE — Patient Instructions (Signed)
Dear Maria [name],  Below is a summary of the goals we discussed during our initial up appointment on June 21, 2021. Please contact me anytime with questions or concerns.   Visit Information  Patient Care Plan: CCM Pharmacy Care Plan     Problem Identified: CHL AMB "PATIENT-SPECIFIC PROBLEM"      Long-Range Goal: Disease Management   Start Date: 06/21/2021  Priority: High  Note:   Current Barriers:  None identified  Pharmacist Clinical Goal(s):  Patient will contact provider office for questions/concerns as evidenced notation of same in electronic health record through collaboration with PharmD and provider.   Interventions: 1:1 collaboration with Tonia Ghent, MD regarding development and update of comprehensive plan of care as evidenced by provider attestation and co-signature Inter-disciplinary care team collaboration (see longitudinal plan of care) Comprehensive medication review performed; medication list updated in electronic medical record  Hypertension (BP goal <140/90) -Controlled, per clinic readings -Current treatment: None  -Medications previously tried: losartan (d/c due to concern for over-treatment), carvedilol, metoprolol  -Current home readings: Spouse has checked at home recently and reports BP was very good. She does not recall range.  -Denies hypotensive/hypertensive symptoms -Educated on BP goals and benefits of medications for prevention of heart attack, stroke and kidney damage; -Counseled to monitor BP at home 1-2 times monthly, document, and provide log at future appointments -Recommended continue monitoring at home on occasion. Let us know if BP consistently > 140/90.  Hyperlipidemia: (LDL goal < 70) -Controlled, LDL 70 -Current treatment: Nexlizet 180-10 mg - 1 tablet daily -Medications previously tried: multiple intolerances  -Educated on Cholesterol goals;  -Recommended to continue current medication  Diabetes (A1c goal  <7%) -Controlled, A1c 5.9%, pt denies any hypoglycemia. She states the glyburide works very well for her.  -Current medications: Glyburide 5 mg - take 1-2 tablet daily (pt taking differently: takes 1/2 tablet (2/5 mg) BID) Accu Chek Aviva Plus -Medications previously tried: metformin   -Current home glucose readings fasting glucose: 2/3 - 2/7 - 137, 130, 137, 126  post prandial glucose: none reported -Denies hypoglycemic/hyperglycemic symptoms -Current meal patterns: tries to limit carbs breakfast: scrambled egg sandwich or cheese toast drinks: orange juice and cranberry juice daily -Current exercise: limited mobility -Educated on A1c and blood sugar goals; -Counseled to check feet daily and get yearly eye exams - eye exam due (patient is scheduling soon) -Recommended to continue current medication; Schedule eye exam.  GERD (Goal: Improve acid-reflux symptoms) -Controlled, per patient report - only used 1 in past month -Current treatment  Omeprazole 20 mg - 1 BID PRN -Medications previously tried: none   -Recommended to continue current medication  Patient Goals/Self-Care Activities Patient will:  - take medications as prescribed as evidenced by patient report and record review  Follow Up Plan: Telephone follow up appointment with care management team member scheduled for: - CCM PharmD follow up 12 months - CCM HC diabetes review 6 months     Ms. Wanke was given information about Chronic Care Management services today including:  CCM service includes personalized support from designated clinical staff supervised by her physician, including individualized plan of care and coordination with other care providers 24/7 contact phone numbers for assistance for urgent and routine care needs. Standard insurance, coinsurance, copays and deductibles apply for chronic care management only during months in which we provide at least 20 minutes of these services. Most insurances cover these  services at 100%, however patients may be responsible for any copay, coinsurance and/or  deductible if applicable. This service may help you avoid the need for more expensive face-to-face services. Only one practitioner may furnish and bill the service in a calendar month. The patient may stop CCM services at any time (effective at the end of the month) by phone call to the office staff.  Patient agreed to services and verbal consent obtained.   Patient verbalizes understanding of instructions and care plan provided today and agrees to view in [place]. Active MyChart status confirmed with patient.    Debbora Dus, PharmD Clinical Pharmacist Practitioner [place] [facility] at [facility] [phone]

## 2021-06-27 ENCOUNTER — Ambulatory Visit (INDEPENDENT_AMBULATORY_CARE_PROVIDER_SITE_OTHER): Payer: Medicare PPO

## 2021-06-27 DIAGNOSIS — Z8673 Personal history of transient ischemic attack (TIA), and cerebral infarction without residual deficits: Secondary | ICD-10-CM

## 2021-06-27 LAB — CUP PACEART REMOTE DEVICE CHECK
Date Time Interrogation Session: 20230212230608
Implantable Pulse Generator Implant Date: 20220407

## 2021-06-28 NOTE — Telephone Encounter (Signed)
Called pt and [email or web address].

## 2021-06-29 NOTE — Progress Notes (Signed)
Carelink Summary Report / Loop Recorder 

## 2021-07-12 DIAGNOSIS — E782 Mixed hyperlipidemia: Secondary | ICD-10-CM

## 2021-07-12 DIAGNOSIS — E119 Type 2 diabetes mellitus without complications: Secondary | ICD-10-CM

## 2021-07-12 DIAGNOSIS — I10 Essential (primary) hypertension: Secondary | ICD-10-CM | POA: Diagnosis not present

## 2021-07-18 ENCOUNTER — Other Ambulatory Visit: Payer: Self-pay

## 2021-07-18 ENCOUNTER — Encounter: Payer: Self-pay | Admitting: Family Medicine

## 2021-07-18 ENCOUNTER — Ambulatory Visit: Payer: Medicare PPO | Admitting: Family Medicine

## 2021-07-18 VITALS — BP 134/86 | HR 87 | Temp 98.1°F | Ht 65.0 in | Wt 154.0 lb

## 2021-07-18 DIAGNOSIS — E1159 Type 2 diabetes mellitus with other circulatory complications: Secondary | ICD-10-CM

## 2021-07-18 DIAGNOSIS — E538 Deficiency of other specified B group vitamins: Secondary | ICD-10-CM | POA: Diagnosis not present

## 2021-07-18 DIAGNOSIS — M549 Dorsalgia, unspecified: Secondary | ICD-10-CM

## 2021-07-18 LAB — BASIC METABOLIC PANEL
BUN: 18 mg/dL (ref 6–23)
CO2: 27 mEq/L (ref 19–32)
Calcium: 9.8 mg/dL (ref 8.4–10.5)
Chloride: 104 mEq/L (ref 96–112)
Creatinine, Ser: 0.96 mg/dL (ref 0.40–1.20)
GFR: 55.31 mL/min — ABNORMAL LOW (ref >60.00)
Glucose, Bld: 159 mg/dL — ABNORMAL HIGH (ref 70–99)
Potassium: 4.2 mEq/L (ref 3.5–5.1)
Sodium: 141 mEq/L (ref 135–145)

## 2021-07-18 MED ORDER — CYANOCOBALAMIN 1000 MCG/ML IJ SOLN
1000.0000 ug | Freq: Once | INTRAMUSCULAR | Status: AC
  Administered 2021-07-18: 1000 ug via INTRAMUSCULAR

## 2021-07-18 NOTE — Patient Instructions (Addendum)
Schedule a B12 shot for every 2 weeks.   ?Go to the lab on the way out.   If you have mychart we'll likely use that to update you.    ? ?Keep getting B12 shots every 2 weeks.  Then get a follow lab visit in about 6 weeks, at the B12 appointment.   ?Take care.  Glad to see you. ?

## 2021-07-18 NOTE — Progress Notes (Signed)
This visit occurred during the SARS-CoV-2 public health emergency.  Safety protocols were in place, including screening questions prior to the visit, additional usage of staff PPE, and extensive cleaning of exam room while observing appropriate contact time as indicated for disinfecting solutions. ? ?Due for B12 injection and needs f/u injection q14 days.  D/w pt about injection today and then getting back on schedule.   ? ?She had back pain at baseline and unclear if pain is from back or from renal cyst.  In discussion, more likely from her back.  No dysuria.  Imaging d/w pt.   ? ?IMPRESSION: ?1. Cysts in right kidney with dominant cyst arising from lower pole ?right kidney measuring 7.2 x 7.1 x 6.6 cm. ?  ?2. Previously noted mildly complex cyst arising from lower pole of ?left kidney is not appreciable on current examination. If further ?evaluation of this area on the left is felt to be warranted, CT or ?MR of the kidneys, ideally pre and postcontrast, could be helpful to ?further evaluate. ? ?Diabetes:  ?Using medications without difficulties: yes, usually taking glyburide 1/2 tab BID.   ?Hypoglycemic episodes:no ?Hyperglycemic episodes:no ?Feet problems: she has neuropathy at baseline.   ?Blood Sugars averaging: usually ~120s in the AMs ?eye exam within last year:  pending, is scheduled.   ? ?Meds, vitals, and allergies reviewed.  ? ?ROS: Per HPI unless specifically indicated in ROS section  ? ?GEN: nad, alert and oriented ?HEENT: ncat ?NECK: supple w/o LA ?CV: rrr ?PULM: ctab, no inc wob ?ABD: soft, +bs ?EXT: no edema ?SKIN: well perfused.  ? ?Diabetic foot exam: ?Normal inspection ?No skin breakdown ?No calluses  ?Normal DP pulses ?Normal sensation to light touch and monofilament ?Nails normal ?

## 2021-07-20 NOTE — Assessment & Plan Note (Signed)
Continue glyburide and recheck A1c later, see avs and orders.  She agrees.   ?

## 2021-07-20 NOTE — Assessment & Plan Note (Signed)
Restart replacement and recheck labs later.  See avs.  She agrees.   ?

## 2021-07-20 NOTE — Assessment & Plan Note (Signed)
Likely from her back and wouldn't suspect that she needs extra imaging of her kidney but reasonable to recheck Cr today.  See notes on labs.   ?

## 2021-07-25 NOTE — Progress Notes (Unsigned)
Subjective:   Maria [name] is a 81 y.o. female who presents for Medicare Annual (Subsequent) preventive examination.  I connected with Maria [name]  today by telephone and verified that I am speaking with the correct person using two identifiers. Location patient: home Location provider: work Persons participating in the virtual visit: patient, Marine scientist.    I discussed the limitations, risks, security and privacy concerns of performing an evaluation and management service by telephone and the availability of in person appointments. I also discussed with the patient that there may be a patient responsible charge related to this service. The patient expressed understanding and verbally consented to this telephonic visit.    Interactive audio and video telecommunications were attempted between this provider and patient, however failed, due to patient having technical difficulties OR patient did not have access to video capability.  We continued and completed visit with audio only.  Some vital signs may be absent or patient reported.   Time Spent with patient on telephone encounter: *** minutes  Review of Systems           Objective:    There were no vitals filed for this visit. There is no height or weight on file to calculate BMI.  Advanced Directives 08/19/2020 08/18/2020 01/02/2020 08/14/2019 06/17/2018 02/05/2018 02/04/2018  Does Patient Have a Medical Advance Directive? Yes Yes Yes Yes No No No  Type of Advance Directive Living will Living will - [place];Living will - Healthcare Power of [place]  Does patient want to make changes to medical advance directive? Yes (Inpatient - patient defers changing a medical advance directive at this time - Information given) - - - - No - Patient declined No - Patient declined  Copy of [place] in Chart? - - - No - copy requested - No - copy requested No - copy requested  Would patient  like information on creating a medical advance directive? No - Patient declined - - - No - Patient declined No - Patient declined No - Patient declined    Current Medications (verified) Outpatient Encounter Medications as of 07/26/2021  Medication Sig   aspirin EC 81 MG tablet Take 81 mg by mouth daily. Swallow whole.   Bempedoic Acid-Ezetimibe (NEXLIZET) 180-10 MG TABS Take 1 tablet by mouth daily.   Blood Glucose Monitoring Suppl (ACCU-CHEK AVIVA PLUS) w/Device KIT 1 kit by Does not apply route as directed.   cyanocobalamin (,VITAMIN B-12,) 1000 MCG/ML injection 1000 mcg injected IM every 14 days.   diclofenac Sodium (VOLTAREN) 1 % GEL Apply 2 g topically 4 (four) times daily as needed.   glucose blood test strip Use as instructed, twice daily to check blood glucose.  Dx E11.9.   glyBURIDE (DIABETA) 5 MG tablet TAKE 1 TO 2 TABLETS BY MOUTH DAILY WITH BREAKFAST.   loperamide (IMODIUM) 2 MG capsule Take 2 mg by mouth 4 (four) times daily as needed for diarrhea or loose stools.    nitroGLYCERIN (NITROSTAT) 0.4 MG SL tablet PLACE 1 TABLET UNDER THE TONGUE EVERY 5 MINUTES AS NEEDED FOR CHEST PAIN FOR UP TO 3 DOSES   omeprazole (PRILOSEC) 20 MG capsule Take 1 capsule (20 mg total) by mouth 2 (two) times daily as needed (for acid reflux/indigestion.).   oxyCODONE (OXY IR/ROXICODONE) 5 MG immediate release tablet Take 1 tablet (5 mg total) by mouth every 6 (six) hours as needed for moderate pain.   traMADol (ULTRAM) 50 MG tablet TAKE 1  TABLET BY MOUTH EVERY 6 HOURS AS NEEDED   No facility-administered encounter medications on file as of 07/26/2021.    Allergies (verified) Crestor [rosuvastatin], Ezetimibe-simvastatin, Pravastatin, Diazepam, Lyrica [pregabalin], Nsaids, Praluent [alirocumab], Repatha [evolocumab], Acetaminophen, Carvedilol, Gabapentin, Ibuprofen, Insulin glargine, Levemir [insulin detemir], Macrobid [nitrofurantoin macrocrystal], Metformin, Metoprolol tartrate, and Naproxen    History: Past Medical History:  Diagnosis Date   Anemia 05/1985   Carotid artery disease ([place])    a. 02/2015 - 1-39% bilaterally.   Chronic back pain    Concussion    Coronary artery disease    a. s/p DES to [facility] 01/2015.   DDD (degenerative disc disease), cervical    DDD (degenerative disc disease), lumbar    Fracture of lower leg 07/2001   Right   GERD (gastroesophageal reflux disease)    Hyperlipidemia    Hypertension    Multinodular thyroid    Pneumonia ?1990's X 1   PSVT (paroxysmal supraventricular tachycardia) ([place])    a. s/p catheter ablation of a parahisian atrial tachycardia which was successfully ablated from the non-coronary cusp of the aortic root 06/2014.   RBBB with left anterior fascicular block    Renal cyst 07/10/2006   bilateral   Right knee pain 02/2010   Injected - Dr. Tonita Cong   Sinus pause    a. By event monitoring 09/2014 - BB stopped at that time.   Statin intolerance    Stroke [facility])    Syncope and collapse 07/2001   "that's when I broke my leg"   Type II diabetes mellitus ([place]) 2002   Past Surgical History:  Procedure Laterality Date   ANTERIOR CERVICAL DECOMP/DISCECTOMY FUSION  10/03/2005   C4/5; C5/6  "it's got a plate in there"   ANTERIOR LAT LUMBAR FUSION Left 02/04/2018   Procedure: Lumbar two-three Lumbar three-four Lumbar four-five  Anterolateral decompression/percutaneous posterior arthrodesis, [place];  Surgeon: Kristeen Miss, MD;  Location: [facility];  Service: Neurosurgery;  Laterality: Left;   APPLICATION OF ROBOTIC ASSISTANCE FOR SPINAL PROCEDURE N/A 02/04/2018   Procedure: APPLICATION OF ROBOTIC ASSISTANCE FOR SPINAL PROCEDURE;  Surgeon: Kristeen Miss, MD;  Location: [place];  Service: Neurosurgery;  Laterality: N/A;   BACK SURGERY     BREAST BIOPSY Bilateral 1968 (multiple)   "all benign"   BREAST LUMPECTOMY Bilateral 1968   CARDIAC CATHETERIZATION N/A 11/18/2014   Procedure: Left Heart Cath and Coronary Angiography;  Surgeon: Peter M Martinique,  MD;  Location: [place] CV LAB;  Service: Cardiovascular;  Laterality: N/A;   CARDIAC CATHETERIZATION N/A 01/22/2015   Procedure: Coronary Stent Intervention;  Surgeon: Peter M Martinique, MD;  Location: [place] CV LAB;  Service: Cardiovascular;  Laterality: N/A;   CARDIAC CATHETERIZATION N/A 06/10/2015   Procedure: Left Heart Cath and Coronary Angiography;  Surgeon: Peter M Martinique, MD;  Location: [place] CV LAB;  Service: Cardiovascular;  Laterality: N/A;   CATARACT EXTRACTION W/ INTRAOCULAR LENS  IMPLANT, BILATERAL Bilateral 12/2010   CORONARY ANGIOPLASTY     ESOPHAGOGASTRODUODENOSCOPY  08/14/2005   gastropathy biopsy, negative   LAPAROSCOPIC CHOLECYSTECTOMY  08/1992   LOOP RECORDER INSERTION N/A 08/19/2020   Procedure: LOOP RECORDER INSERTION;  Surgeon: Evans Lance, MD;  Location: [place] [facility] LAB;  Service: Cardiovascular;  Laterality: N/A;   LUMBAR PERCUTANEOUS PEDICLE SCREW 3 LEVEL N/A 02/04/2018   Procedure: LUMBAR PERCUTANEOUS PEDICLE SCREW LUMBAR TWO - LUMBAR FIVE;  Surgeon: Kristeen Miss, MD;  Location: [place];  Service: Neurosurgery;  Laterality: N/A;   POSTERIOR LAMINECTOMY / DECOMPRESSION LUMBAR SPINE  09/2001   due to herniated disc   SUPRAVENTRICULAR TACHYCARDIA ABLATION N/A 07/06/2014   Procedure: SUPRAVENTRICULAR TACHYCARDIA ABLATION;  Surgeon: Evans Lance, MD;  Location: [facility] CATH LAB;  Service: Cardiovascular;  Laterality: N/A;   VAGINAL HYSTERECTOMY  1968   spotting   Family History  Problem Relation Age of Onset   Hypertension Mother    Heart disease Mother    Diabetes Sister    Myasthenia gravis Sister    Diabetes Brother        1/2 juvenile; foot ulcer; periph. neuropathy   Heart disease Maternal Grandfather        MI   Hypertension Maternal Grandfather    Depression Paternal Aunt    Hypertension Maternal Grandmother    Diabetes Maternal Grandmother    Stroke Maternal Grandmother    Hypertension Paternal Grandmother    Diabetes Paternal Grandmother     Hypertension Paternal Grandfather    Diabetes Paternal Grandfather    Breast cancer Maternal Aunt    Colon cancer Neg Hx    Social History   Socioeconomic History   Marital status: Married    Spouse name: Not on file   Number of children: 2   Years of education: Not on file   Highest education level: Not on file  Occupational History   Occupation: Retired; Doctor, general practice, [place]    Employer: RETIRED    Comment: Works a couple days a week  Tobacco Use   Smoking status: Never   Smokeless tobacco: Never  Scientific laboratory technician Use: Never used  Substance and Sexual Activity   Alcohol use: No    Alcohol/week: 0.0 standard drinks   Drug use: No   Sexual activity: Yes  Other Topics Concern   Not on file  Social History Narrative   Widowed 2011 after 52 years of marriage (husband had C.diff)   Remarried 2015   Right handed   One story home   Occasional caffeine   Social Determinants of Health   Financial Resource Strain: Low Risk    Difficulty of Paying Living Expenses: Not very hard  Food Insecurity: Not on file  Transportation Needs: No Transportation Needs   Lack of Transportation (Medical): No   Lack of Transportation (Non-Medical): No  Physical Activity: Not on file  Stress: Not on file  Social Connections: Not on file    Tobacco Counseling Counseling given: Not Answered   Clinical Intake:                Diabetes:  Is the patient diabetic?  Yes  If diabetic, was a CBG obtained today?  No  Did the patient bring in their glucometer from home?  No  How often do you monitor your CBG's? ***.   Financial Strains and Diabetes Management:  Are you having any financial strains with the device, your supplies or your medication? {YES/NO:21197}.  Does the patient want to be seen by Chronic Care Management for management of their diabetes?  {YES/NO:21197} Would the patient like to be referred to a Nutritionist or for Diabetic Management?   {YES/NO:21197}  Diabetic Exams:  Diabetic Eye Exam: Completed ***. Overdue for diabetic eye exam. Pt has been advised about the importance in completing this exam. A referral has been placed today. Message sent to referral coordinator for scheduling purposes. Advised pt to expect a call from our office re: appt.  Diabetic Foot Exam: Completed ***. Pt has been advised about the importance in completing this exam. Pt  is scheduled for diabetic foot exam on ***.           Activities of Daily Living In your present state of health, do you have any difficulty performing the following activities: 08/19/2020 08/19/2020  Hearing? - N  Vision? - N  Difficulty concentrating or making decisions? - N  Walking or climbing stairs? - Y  Dressing or bathing? - N  Doing errands, shopping? N -  Some recent data might be hidden    Patient Care Team: Tonia Ghent, MD as PCP - General Josue Hector, MD as PCP - Cardiology (Cardiology) Kristeen Miss, MD as Consulting Physician (Neurosurgery) Debbora Dus, [facility] (Pharmacist)  Indicate any recent Medical Services you may have received from other than [facility] providers in the past year (date may be approximate).     Assessment:   This is a routine wellness examination for [place].  Hearing/Vision screen No results found.  Dietary issues and exercise activities discussed:     Goals Addressed   None    Depression Screen PHQ 2/9 Scores 05/03/2021 04/23/2020 08/14/2019 05/31/2017 07/05/2016  PHQ - 2 Score 0 0 0 0 0  PHQ- 9 Score 1 - 0 - -    Fall Risk Fall Risk  05/03/2021 04/23/2020 01/02/2020 08/14/2019 11/18/2018  Falls in the past year? 0 0 1 1 1   Comment - - - tripped and fell -  Number falls in past yr: 0 0 1 1 1   Injury with Fall? 0 0 1 0 1  Comment - - - - had staples in head from fall in the past  Risk for fall due to : No Fall Risks - - Impaired balance/gait;Medication side effect;History of fall(s) -  Follow up Falls evaluation  completed Falls evaluation completed - Falls evaluation completed;Falls prevention discussed -    FALL RISK PREVENTION PERTAINING TO THE HOME:  Any stairs in or around the home? {YES/NO:21197} If so, are there any without handrails? {YES/NO:21197} Home free of loose throw rugs in walkways, pet beds, electrical cords, etc? {YES/NO:21197} Adequate lighting in your home to reduce risk of falls? {YES/NO:21197}  ASSISTIVE DEVICES UTILIZED TO PREVENT FALLS:  Life alert? {YES/NO:21197} Use of a cane, walker or w/c? {YES/NO:21197} Grab bars in the bathroom? {YES/NO:21197} Shower chair or bench in shower? {YES/NO:21197} Elevated toilet seat or a handicapped toilet? {YES/NO:21197}  TIMED UP AND GO:  Was the test performed? No .  Length of time to ambulate 10 feet: *** sec.   {Appearance of Gait:2101803}  Cognitive Function: MMSE - Mini Mental State Exam 08/14/2019 07/05/2016 07/05/2016  Orientation to time 5 5 5   Orientation to Place 5 5 5   Registration 3 3 3   Attention/ Calculation 5 0 0  Recall 3 3 3   Language- name 2 objects - 0 0  Language- repeat 1 1 1   Language- follow 3 step command - 3 3  Language- read & follow direction - 0 0  Write a sentence - 0 0  Copy design - 0 0  Total score - 20 20        Immunizations Immunization History  Administered Date(s) Administered   Fluad Quad(high Dose 65+) 05/03/2021   Influenza Split 02/22/2012   Influenza Whole 02/21/2006, 02/13/2007, 02/07/2008, 02/17/2009, 02/25/2010   Influenza, High Dose Seasonal PF 03/01/2017   Influenza,inj,Quad PF,6+ Mos 01/23/2015, 03/29/2016, 04/23/2018, 01/09/2019   Influenza,inj,quad, With Preservative 02/12/2017   Influenza-Unspecified 02/26/2014, 02/13/2016   PFIZER(Purple Top)SARS-COV-2 Vaccination 07/10/2019, 07/30/2019, 05/04/2020   Pneumococcal Conjugate-13 12/18/2013  Pneumococcal Polysaccharide-23 07/17/2000, 02/11/2015   Pneumococcal-Unspecified 07/14/2015   Td 05/15/1996, 01/15/2008    Tdap 02/22/2017   Zoster, Live 10/02/2011    TDAP status: Up to date  Flu Vaccine status: Up to date  Pneumococcal vaccine status: Up to date  {Covid-19 vaccine status:2101808}  Qualifies for Shingles Vaccine? Yes   Zostavax completed Yes   {Shingrix Completed?:2101804}  Screening Tests Health Maintenance  Topic Date Due   Zoster Vaccines- Shingrix (1 of 2) Never done   COVID-19 Vaccine (4 - Booster for Pfizer series) 06/29/2020   OPHTHALMOLOGY EXAM  07/13/2020   COLONOSCOPY (Pts 45-66yr Insurance coverage will need to be confirmed)  08/14/2023 (Originally 04/08/2017)   HEMOGLOBIN A1C  11/01/2021   FOOT EXAM  07/19/2022   TETANUS/TDAP  02/23/2027   Pneumonia Vaccine 81 Years old  Completed   INFLUENZA VACCINE  Completed   DEXA SCAN  Completed   HPV VACCINES  Aged Out    Health Maintenance  Health Maintenance Due  Topic Date Due   Zoster Vaccines- Shingrix (1 of 2) Never done   COVID-19 Vaccine (4 - Booster for Pfizer series) 06/29/2020   OPHTHALMOLOGY EXAM  07/13/2020    Colorectal cancer screening: No longer required.   {Mammogram status:21018020}  {Bone Density status:21018021}  Lung Cancer Screening: (Low Dose CT Chest recommended if Age 82-80years, 30 pack-year currently smoking OR have quit w/in 15years.) does not qualify.     Additional Screening:  Hepatitis C Screening: does qualify  Vision Screening: Recommended annual ophthalmology exams for early detection of glaucoma and other disorders of the eye. Is the patient up to date with their annual eye exam?  {YES/NO:21197} Who is the provider or what is the name of the office in which the patient attends annual eye exams? *** If pt is not established with a provider, would they like to be referred to a provider to establish care? {YES/NO:21197}.   Dental Screening: Recommended annual dental exams for proper oral hygiene  Community Resource Referral / Chronic Care Management: CRR required this  visit?  {YES/NO:21197}  CCM required this visit?  {YES/NO:21197}     Plan:     I have personally reviewed and noted the following in the patients chart:   Medical and social history Use of alcohol, tobacco or illicit drugs  Current medications and supplements including opioid prescriptions.  Functional ability and status Nutritional status Physical activity Advanced directives List of other physicians Hospitalizations, surgeries, and ER visits in previous 12 months Vitals Screenings to include cognitive, depression, and falls Referrals and appointments  In addition, I have reviewed and discussed with patient certain preventive protocols, quality metrics, and best practice recommendations. A written personalized care plan for preventive services as well as general preventive health recommendations were provided to patient.   Due to this being a telephonic visit, the after visit summary with patients personalized plan was offered to patient via mail or my-chart. ***Patient declined at this time./ Patient would like to access on my-chart/ per request, patient was mailed a copy of AVS./ Patient preferred to pick up at office at next visit.   TLoma Messing LPN   [id number]  Nurse Health Advisor  Nurse Notes: none

## 2021-07-26 ENCOUNTER — Ambulatory Visit (INDEPENDENT_AMBULATORY_CARE_PROVIDER_SITE_OTHER): Payer: Medicare PPO

## 2021-07-26 VITALS — Ht 65.0 in | Wt 154.0 lb

## 2021-07-26 DIAGNOSIS — Z1231 Encounter for screening mammogram for malignant neoplasm of breast: Secondary | ICD-10-CM | POA: Diagnosis not present

## 2021-07-26 DIAGNOSIS — Z780 Asymptomatic menopausal state: Secondary | ICD-10-CM

## 2021-07-26 DIAGNOSIS — Z0000 Encounter for general adult medical examination without abnormal findings: Secondary | ICD-10-CM | POA: Diagnosis not present

## 2021-07-26 NOTE — Patient Instructions (Signed)
Maria [name] , ?Thank you for taking time to complete your Medicare Wellness Visit. I appreciate your ongoing commitment to your health goals. Please review the following plan we discussed and let me know if I can assist you in the future.  ? ?Screening recommendations/referrals: ?Colonoscopy: no longer required  ?Mammogram: due, ordered today someone will call to schedule an appointment ?Bone Density: due, ordered today someone will call to schedule an appointment ?Recommended yearly ophthalmology/optometry visit for glaucoma screening and checkup ?Recommended yearly dental visit for hygiene and checkup ? ?Vaccinations: ?Influenza vaccine: up to date ?Pneumococcal vaccine: up to date ?Tdap vaccine: up to date, due 02/23/27 ?Shingles vaccine: Discuss with pharmacy   ?Covid-19:newest booster available at your local pharmacy  ? ?Advanced directives: Please bring a copy of Living Will and/or Healthcare Power of Attorney for your chart, when available  ? ? ?Conditions/risks identified: see problem list  ? ?Next appointment: Follow up in one year for your annual wellness visit  ? ? ?Preventive Care 82 Years and Older, Female ?Preventive care refers to lifestyle choices and visits with your health care provider that can promote health and wellness. ?What does preventive care include? ?A yearly physical exam. This is also called an annual well check. ?Dental exams once or twice a year. ?Routine eye exams. Ask your health care provider how often you should have your eyes checked. ?Personal lifestyle choices, including: ?Daily care of your teeth and gums. ?Regular physical activity. ?Eating a healthy diet. ?Avoiding tobacco and drug use. ?Limiting alcohol use. ?Practicing safe sex. ?Taking low-dose aspirin every day. ?Taking vitamin and mineral supplements as recommended by your health care provider. ?What happens during an annual well check? ?The services and screenings done by your health care provider during your annual well  check will depend on your age, overall health, lifestyle risk factors, and family history of disease. ?Counseling  ?Your health care provider may ask you questions about your: ?Alcohol use. ?Tobacco use. ?Drug use. ?Emotional well-being. ?Home and relationship well-being. ?Sexual activity. ?Eating habits. ?History of falls. ?Memory and ability to understand (cognition). ?Work and work Astronomer. ?Reproductive health. ?Screening  ?You may have the following tests or measurements: ?Height, weight, and BMI. ?Blood pressure. ?Lipid and cholesterol levels. These may be checked every 5 years, or more frequently if you are over 60 years old. ?Skin check. ?Lung cancer screening. You may have this screening every year starting at age 15 if you have a 30-pack-year history of smoking and currently smoke or have quit within the past 15 years. ?Fecal occult blood test (FOBT) of the stool. You may have this test every year starting at age 21. ?Flexible sigmoidoscopy or colonoscopy. You may have a sigmoidoscopy every 5 years or a colonoscopy every 10 years starting at age 52. ?Hepatitis C blood test. ?Hepatitis B blood test. ?Sexually transmitted disease (STD) testing. ?Diabetes screening. This is done by checking your blood sugar (glucose) after you have not eaten for a while (fasting). You may have this done every 1-3 years. ?Bone density scan. This is done to screen for osteoporosis. You may have this done starting at age 65. ?Mammogram. This may be done every 1-2 years. Talk to your health care provider about how often you should have regular mammograms. ?Talk with your health care provider about your test results, treatment options, and if necessary, the need for more tests. ?Vaccines  ?Your health care provider may recommend certain vaccines, such as: ?Influenza vaccine. This is recommended every year. ?Tetanus, diphtheria, and  acellular pertussis (Tdap, Td) vaccine. You may need a Td booster every 10 years. ?Zoster  vaccine. You may need this after age 20. ?Pneumococcal 13-valent conjugate (PCV13) vaccine. One dose is recommended after age 73. ?Pneumococcal polysaccharide (PPSV23) vaccine. One dose is recommended after age 36. ?Talk to your health care provider about which screenings and vaccines you need and how often you need them. ?This information is not intended to replace advice given to you by your health care provider. Make sure you discuss any questions you have with your health care provider. ?Document Released: 05/28/2015 Document Revised: 01/19/2016 Document Reviewed: 03/02/2015 ?Elsevier Interactive Patient Education ? 2017 [place]. ? ?Fall Prevention in the Home ?Falls can cause injuries. They can happen to people of all ages. There are many things you can do to make your home safe and to help prevent falls. ?What can I do on the outside of my home? ?Regularly fix the edges of walkways and driveways and fix any cracks. ?Remove anything that might make you trip as you walk through a door, such as a raised step or threshold. ?Trim any bushes or trees on the path to your home. ?Use bright outdoor lighting. ?Clear any walking paths of anything that might make someone trip, such as rocks or tools. ?Regularly check to see if handrails are loose or broken. Make sure that both sides of any steps have handrails. ?Any raised decks and porches should have guardrails on the edges. ?Have any leaves, snow, or ice cleared regularly. ?Use sand or salt on walking paths during winter. ?Clean up any spills in your garage right away. This includes oil or grease spills. ?What can I do in the bathroom? ?Use night lights. ?Install grab bars by the toilet and in the tub and shower. Do not use towel bars as grab bars. ?Use non-skid mats or decals in the tub or shower. ?If you need to sit down in the shower, use a plastic, non-slip stool. ?Keep the floor dry. Clean up any water that spills on the floor as soon as it happens. ?Remove  soap buildup in the tub or shower regularly. ?Attach bath mats securely with double-sided non-slip rug tape. ?Do not have throw rugs and other things on the floor that can make you trip. ?What can I do in the bedroom? ?Use night lights. ?Make sure that you have a light by your bed that is easy to reach. ?Do not use any sheets or blankets that are too big for your bed. They should not hang down onto the floor. ?Have a firm chair that has side arms. You can use this for support while you get dressed. ?Do not have throw rugs and other things on the floor that can make you trip. ?What can I do in the kitchen? ?Clean up any spills right away. ?Avoid walking on wet floors. ?Keep items that you use a lot in easy-to-reach places. ?If you need to reach something above you, use a strong step stool that has a grab bar. ?Keep electrical cords out of the way. ?Do not use floor polish or wax that makes floors slippery. If you must use wax, use non-skid floor wax. ?Do not have throw rugs and other things on the floor that can make you trip. ?What can I do with my stairs? ?Do not leave any items on the stairs. ?Make sure that there are handrails on both sides of the stairs and use them. Fix handrails that are broken or loose. Make sure that  handrails are as long as the stairways. ?Check any carpeting to make sure that it is firmly attached to the stairs. Fix any carpet that is loose or worn. ?Avoid having throw rugs at the top or bottom of the stairs. If you do have throw rugs, attach them to the floor with carpet tape. ?Make sure that you have a light switch at the top of the stairs and the bottom of the stairs. If you do not have them, ask someone to add them for you. ?What else can I do to help prevent falls? ?Wear shoes that: ?Do not have high heels. ?Have rubber bottoms. ?Are comfortable and fit you well. ?Are closed at the toe. Do not wear sandals. ?If you use a stepladder: ?Make sure that it is fully opened. Do not climb a  closed stepladder. ?Make sure that both sides of the stepladder are locked into place. ?Ask someone to hold it for you, if possible. ?Clearly mark and make sure that you can see: ?Any grab bars or handrails.

## 2021-08-01 ENCOUNTER — Ambulatory Visit (INDEPENDENT_AMBULATORY_CARE_PROVIDER_SITE_OTHER): Payer: Medicare PPO

## 2021-08-01 DIAGNOSIS — Z8673 Personal history of transient ischemic attack (TIA), and cerebral infarction without residual deficits: Secondary | ICD-10-CM | POA: Diagnosis not present

## 2021-08-01 LAB — CUP PACEART REMOTE DEVICE CHECK
Date Time Interrogation Session: 20230319230436
Implantable Pulse Generator Implant Date: 20220407

## 2021-08-03 ENCOUNTER — Other Ambulatory Visit: Payer: Medicare PPO

## 2021-08-03 ENCOUNTER — Other Ambulatory Visit: Payer: Self-pay

## 2021-08-03 ENCOUNTER — Ambulatory Visit (INDEPENDENT_AMBULATORY_CARE_PROVIDER_SITE_OTHER): Payer: Medicare PPO

## 2021-08-03 DIAGNOSIS — E538 Deficiency of other specified B group vitamins: Secondary | ICD-10-CM

## 2021-08-03 MED ORDER — CYANOCOBALAMIN 1000 MCG/ML IJ SOLN
1000.0000 ug | Freq: Once | INTRAMUSCULAR | Status: AC
  Administered 2021-08-03: 1000 ug via INTRAMUSCULAR

## 2021-08-03 NOTE — Progress Notes (Signed)
Pt come in  this morning to receive a Vit B12 injection , pt was given a Vit B12 injection IM in the Left Deltoid  successfully without  any concerns. ?

## 2021-08-10 NOTE — Progress Notes (Signed)
Carelink Summary Report / Loop Recorder 

## 2021-08-17 ENCOUNTER — Ambulatory Visit (INDEPENDENT_AMBULATORY_CARE_PROVIDER_SITE_OTHER): Payer: Medicare PPO

## 2021-08-17 DIAGNOSIS — E538 Deficiency of other specified B group vitamins: Secondary | ICD-10-CM

## 2021-08-17 MED ORDER — CYANOCOBALAMIN 1000 MCG/ML IJ SOLN
1000.0000 ug | Freq: Once | INTRAMUSCULAR | Status: AC
  Administered 2021-08-17: 1000 ug via INTRAMUSCULAR

## 2021-08-17 NOTE — Progress Notes (Signed)
Patient presented for B 12 injection given by Lemarcus Baggerly, CMA to right deltoid, patient voiced no concerns nor showed any signs of distress during injection.  

## 2021-08-19 ENCOUNTER — Other Ambulatory Visit: Payer: Self-pay | Admitting: Family Medicine

## 2021-08-19 NOTE — Telephone Encounter (Signed)
Refill request for Tramadol 50 mg tabs ? ?LOV - 07/18/21 ?Next OV - not scheduled ?Last refill - 03/25/21 #60/2 ? ?

## 2021-08-21 NOTE — Telephone Encounter (Signed)
Sent. Thanks.   

## 2021-08-22 ENCOUNTER — Other Ambulatory Visit: Payer: Self-pay | Admitting: Family Medicine

## 2021-08-22 MED ORDER — OXYCODONE HCL 5 MG PO TABS: 5.0000 mg | ORAL_TABLET | Freq: Four times a day (QID) | ORAL | 0 refills | Status: DC | PRN

## 2021-08-22 NOTE — Progress Notes (Signed)
Husband reported patient was needing refill on oxycodone.  Rx sent.   ?

## 2021-08-31 ENCOUNTER — Ambulatory Visit (INDEPENDENT_AMBULATORY_CARE_PROVIDER_SITE_OTHER): Payer: Medicare PPO

## 2021-08-31 DIAGNOSIS — E538 Deficiency of other specified B group vitamins: Secondary | ICD-10-CM

## 2021-08-31 MED ORDER — CYANOCOBALAMIN 1000 MCG/ML IJ SOLN
1000.0000 ug | Freq: Once | INTRAMUSCULAR | Status: AC
  Administered 2021-08-31: 1000 ug via INTRAMUSCULAR

## 2021-08-31 NOTE — Progress Notes (Signed)
Per orders of Dr. Eustaquio Boyden, injection of B-12 given by Donnamarie Poag in left deltoid. ?Patient tolerated injection well. Patient will make appointment for 2 week. ? ? ?

## 2021-09-01 ENCOUNTER — Ambulatory Visit
Admission: RE | Admit: 2021-09-01 | Discharge: 2021-09-01 | Disposition: A | Discharge status: Home / Self Care | Payer: Medicare PPO | Source: Ambulatory Visit | Attending: Family Medicine | Admitting: Family Medicine

## 2021-09-01 DIAGNOSIS — M810 Age-related osteoporosis without current pathological fracture: Secondary | ICD-10-CM | POA: Diagnosis not present

## 2021-09-01 DIAGNOSIS — Z780 Asymptomatic menopausal state: Secondary | ICD-10-CM | POA: Diagnosis not present

## 2021-09-01 DIAGNOSIS — Z1231 Encounter for screening mammogram for malignant neoplasm of breast: Secondary | ICD-10-CM

## 2021-09-01 DIAGNOSIS — M85852 Other specified disorders of bone density and structure, left thigh: Secondary | ICD-10-CM | POA: Diagnosis not present

## 2021-09-05 ENCOUNTER — Ambulatory Visit (INDEPENDENT_AMBULATORY_CARE_PROVIDER_SITE_OTHER): Payer: Medicare PPO

## 2021-09-05 DIAGNOSIS — I639 Cerebral infarction, unspecified: Secondary | ICD-10-CM

## 2021-09-05 LAB — CUP PACEART REMOTE DEVICE CHECK
Date Time Interrogation Session: 20230421230341
Implantable Pulse Generator Implant Date: 20220407

## 2021-09-07 ENCOUNTER — Encounter: Payer: Self-pay | Admitting: Family Medicine

## 2021-09-07 DIAGNOSIS — M810 Age-related osteoporosis without current pathological fracture: Secondary | ICD-10-CM | POA: Insufficient documentation

## 2021-09-15 ENCOUNTER — Encounter: Payer: Self-pay | Admitting: Family Medicine

## 2021-09-15 ENCOUNTER — Ambulatory Visit: Payer: Medicare PPO | Admitting: Family Medicine

## 2021-09-15 VITALS — BP 118/78 | HR 92 | Temp 98.0°F | Ht 65.0 in | Wt 156.0 lb

## 2021-09-15 DIAGNOSIS — E1159 Type 2 diabetes mellitus with other circulatory complications: Secondary | ICD-10-CM

## 2021-09-15 DIAGNOSIS — E538 Deficiency of other specified B group vitamins: Secondary | ICD-10-CM

## 2021-09-15 DIAGNOSIS — M810 Age-related osteoporosis without current pathological fracture: Secondary | ICD-10-CM | POA: Diagnosis not present

## 2021-09-15 MED ORDER — OXYCODONE HCL 5 MG PO TABS: 5.0000 mg | ORAL_TABLET | Freq: Four times a day (QID) | ORAL | 0 refills | Status: DC | PRN

## 2021-09-15 MED ORDER — CYANOCOBALAMIN 1000 MCG/ML IJ SOLN
1000.0000 ug | Freq: Once | INTRAMUSCULAR | Status: AC
  Administered 2021-09-15: 1000 ug via INTRAMUSCULAR

## 2021-09-15 NOTE — Patient Instructions (Addendum)
Go to the lab on the way out.   If you have mychart we'll likely use that to update you.    ?Then get a B12 dose after the labs.   ?Take care.  Glad to see you. ? ?Think about fosamax use in the meantime.  ?

## 2021-09-15 NOTE — Progress Notes (Signed)
She is putting up with pain and taking oxycodone at baseline, prn.  No ADE on med.  She is trying to "stretch" the rx, used during periods of inc activity or prolonged sitting.  Taking tramadol if needed in the meantime, not when taking oxycodone.   ? ?Still on B12 at baseline.  Due for a dose today.  Labs pending for today.   ? ?DM2.  Sugar has been 110-115.  No lows.  No ADE on meds.  A1c pending.  See notes on labs. ? ?D/w pt about DXA results and osteoporosis path/phys in general, including vit D and calcium.  ?Reasonable to consider treatment with bisphosphonate, ie fosamax, but it makes sense to check her vitamin D level first.  D/w pt about risk benefit, especially GI sx, jaw and long bone pathology.   ?She'll consider and update me with her preference after we check her vitamin D. ? ?Meds, vitals, and allergies reviewed.  ? ?ROS: Per HPI unless specifically indicated in ROS section  ? ?Nad ?Ncat ?Neck supple, no LA ?Rrr ?Ctab ?Abd soft, not ttp ?Skin well perfused. ? ?30 minutes were devoted to patient care in this encounter (this includes time spent reviewing the patient's file/history, interviewing and examining the patient, counseling/reviewing plan with patient).  ? ?

## 2021-09-16 LAB — HEMOGLOBIN A1C: Hgb A1c MFr Bld: 6.3 % (ref 4.6–6.5)

## 2021-09-16 LAB — VITAMIN D 25 HYDROXY (VIT D DEFICIENCY, FRACTURES): VITD: 26.82 ng/mL — ABNORMAL LOW (ref 30.00–100.00)

## 2021-09-16 LAB — VITAMIN B12: Vitamin B-12: 729 pg/mL (ref 211–911)

## 2021-09-18 ENCOUNTER — Other Ambulatory Visit: Payer: Self-pay | Admitting: Family Medicine

## 2021-09-18 DIAGNOSIS — E538 Deficiency of other specified B group vitamins: Secondary | ICD-10-CM

## 2021-09-18 DIAGNOSIS — M810 Age-related osteoporosis without current pathological fracture: Secondary | ICD-10-CM

## 2021-09-18 DIAGNOSIS — E1159 Type 2 diabetes mellitus with other circulatory complications: Secondary | ICD-10-CM

## 2021-09-18 MED ORDER — VITAMIN D (ERGOCALCIFEROL) 1.25 MG (50000 UNIT) PO CAPS: 50000.0000 [IU] | ORAL_CAPSULE | ORAL | 0 refills | Status: DC

## 2021-09-18 NOTE — Assessment & Plan Note (Signed)
Labs checked at office visit today.  See notes on labs.  Then dose given after that. ?

## 2021-09-18 NOTE — Assessment & Plan Note (Signed)
D/w pt about DXA results and osteoporosis path/phys in general, including vit D and calcium.  ?Reasonable to consider treatment with bisphosphonate, ie fosamax, but it makes sense to check her vitamin D level first.  D/w pt about risk benefit, especially GI sx, jaw and long bone pathology.   ?She'll consider and update me with her preference after we check her vitamin D. ?

## 2021-09-18 NOTE — Assessment & Plan Note (Signed)
Sugar has been 110-115.  No lows.  No ADE on meds.  A1c pending.  See notes on labs. ?

## 2021-09-19 DIAGNOSIS — D225 Melanocytic nevi of trunk: Secondary | ICD-10-CM | POA: Diagnosis not present

## 2021-09-19 DIAGNOSIS — D2239 Melanocytic nevi of other parts of face: Secondary | ICD-10-CM | POA: Diagnosis not present

## 2021-09-19 DIAGNOSIS — D2261 Melanocytic nevi of right upper limb, including shoulder: Secondary | ICD-10-CM | POA: Diagnosis not present

## 2021-09-19 DIAGNOSIS — Z09 Encounter for follow-up examination after completed treatment for conditions other than malignant neoplasm: Secondary | ICD-10-CM | POA: Diagnosis not present

## 2021-09-19 DIAGNOSIS — L821 Other seborrheic keratosis: Secondary | ICD-10-CM | POA: Diagnosis not present

## 2021-09-19 DIAGNOSIS — L814 Other melanin hyperpigmentation: Secondary | ICD-10-CM | POA: Diagnosis not present

## 2021-09-19 DIAGNOSIS — Z86007 Personal history of in-situ neoplasm of skin: Secondary | ICD-10-CM | POA: Diagnosis not present

## 2021-09-19 DIAGNOSIS — D2262 Melanocytic nevi of left upper limb, including shoulder: Secondary | ICD-10-CM | POA: Diagnosis not present

## 2021-09-19 DIAGNOSIS — Z08 Encounter for follow-up examination after completed treatment for malignant neoplasm: Secondary | ICD-10-CM | POA: Diagnosis not present

## 2021-09-20 ENCOUNTER — Ambulatory Visit: Payer: Medicare PPO | Admitting: Family Medicine

## 2021-09-20 ENCOUNTER — Encounter: Payer: Self-pay | Admitting: Family Medicine

## 2021-09-20 VITALS — BP 126/84 | HR 98 | Temp 97.5°F | Ht 65.0 in | Wt 156.5 lb

## 2021-09-20 DIAGNOSIS — S90822A Blister (nonthermal), left foot, initial encounter: Secondary | ICD-10-CM | POA: Insufficient documentation

## 2021-09-20 DIAGNOSIS — E1159 Type 2 diabetes mellitus with other circulatory complications: Secondary | ICD-10-CM | POA: Diagnosis not present

## 2021-09-20 MED ORDER — CEPHALEXIN 500 MG PO CAPS: 500.0000 mg | ORAL_CAPSULE | Freq: Three times a day (TID) | ORAL | 0 refills | Status: DC

## 2021-09-20 NOTE — Progress Notes (Signed)
? ? Patient ID: [place], female    DOB: 07-Apr-1941, 81 y.o.   MRN: [id number] ? ?This visit was conducted in person. ? ?BP 126/84   Pulse 98   Temp (!) 97.5 ?F (36.4 ?C) (Temporal)   Ht 5' 5"  (1.651 m)   Wt 156 lb 8 oz (71 kg)   SpO2 95%   BMI 26.04 kg/m?   ? ?CC: check foot blister  ?Subjective:  ? ?HPI: ?Maria [name] is a 81 y.o. female presenting on 09/20/2021 for Blister (C/o blister on anterior L foot at base of toes.  Pt concerned due to DM.  Noticed 09/19/21.) ? ? ?Yesterday noticed big blister that developed between left 4th /5th digits. No pain, redness, fever, swelling.  ?She wears well fitting shoes.  ?As of today the blister has burst.  ? ?No h/o MRSA ? ?H/o neuropathy after back surgery.  ?Ccontrolled diabetic on glyburide 33m 1/2 tab with breakfast and dinner, last saw PCP last week.  ?Lab Results  ?Component Value Date  ? HGBA1C 6.3 09/15/2021  ? ?Lab Results  ?Component Value Date  ? CREATININE 0.96 07/18/2021  ? BUN 18 07/18/2021  ? NA 141 07/18/2021  ? K 4.2 07/18/2021  ? CL 104 07/18/2021  ? CO2 27 07/18/2021  ?  ?   ? ?Relevant past medical, surgical, family and social history reviewed and updated as indicated. Interim medical history since our last visit reviewed. ?Allergies and medications reviewed and updated. ?Outpatient Medications Prior to Visit  ?Medication Sig Dispense Refill  ? aspirin EC 81 MG tablet Take 81 mg by mouth daily. Swallow whole.    ? Bempedoic Acid-Ezetimibe (NEXLIZET) 180-10 MG TABS Take 1 tablet by mouth daily. 90 tablet 3  ? Blood Glucose Monitoring Suppl (ACCU-CHEK AVIVA PLUS) w/Device KIT 1 kit by Does not apply route as directed. 1 kit 0  ? cyanocobalamin (,VITAMIN B-12,) 1000 MCG/ML injection 1000 mcg injected IM every 14 days.    ? diclofenac Sodium (VOLTAREN) 1 % GEL Apply 2 g topically 4 (four) times daily as needed. 100 g 1  ? glucose blood test strip Use as instructed, twice daily to check blood glucose.  Dx E11.9. 100 each 12  ? glyBURIDE (DIABETA) 5  MG tablet TAKE 1 TO 2 TABLETS BY MOUTH DAILY WITH BREAKFAST. 60 tablet 1  ? loperamide (IMODIUM) 2 MG capsule Take 2 mg by mouth 4 (four) times daily as needed for diarrhea or loose stools.     ? nitroGLYCERIN (NITROSTAT) 0.4 MG SL tablet PLACE 1 TABLET UNDER THE TONGUE EVERY 5 MINUTES AS NEEDED FOR CHEST PAIN FOR UP TO 3 DOSES 25 tablet 7  ? omeprazole (PRILOSEC) 20 MG capsule Take 1 capsule (20 mg total) by mouth 2 (two) times daily as needed (for acid reflux/indigestion.).    ? oxyCODONE (OXY IR/ROXICODONE) 5 MG immediate release tablet Take 1 tablet (5 mg total) by mouth every 6 (six) hours as needed for moderate pain. 30 tablet 0  ? traMADol (ULTRAM) 50 MG tablet Take 1 tablet (50 mg total) by mouth every 6 (six) hours as needed (do not take wtih oxycodone). 60 tablet 2  ? Vitamin D, Ergocalciferol, (DRISDOL) 1.25 MG (50000 UNIT) CAPS capsule Take 1 capsule (50,000 Units total) by mouth every 7 (seven) days. 12 capsule 0  ? ?No facility-administered medications prior to visit.  ?  ? ?Per HPI unless specifically indicated in ROS section below ?Review of Systems ? ?Objective:  ?BP 126/84  Pulse 98   Temp (!) 97.5 ?F (36.4 ?C) (Temporal)   Ht 5' 5"  (1.651 m)   Wt 156 lb 8 oz (71 kg)   SpO2 95%   BMI 26.04 kg/m?   ?Wt Readings from Last 3 Encounters:  ?09/20/21 156 lb 8 oz (71 kg)  ?09/15/21 156 lb (70.8 kg)  ?07/26/21 154 lb (69.9 kg)  ?  ?  ?Physical Exam ?Vitals and nursing note reviewed.  ?Constitutional:   ?   Appearance: Normal appearance. She is not ill-appearing.  ?Musculoskeletal:     ?   General: Normal range of motion.  ?   Right lower leg: No edema.  ?   Left lower leg: No edema.  ?   Comments: 2+ DP/PT on left  ?Skin: ?   General: Skin is warm and dry.  ?   Findings: Lesion present. No rash.  ?   Comments: Blister of left dorsal foot involving webspace between fourth and fifth digits without surrounding erythema  ?Neurological:  ?   Mental Status: She is alert.  ?   Sensory: Sensory deficit  present.  ?   Comments: Diminished sensation to light touch bilateral feet  ? ?   ?Results for orders placed or performed in visit on 09/15/21  ?Vitamin B12  ?Result Value Ref Range  ? Vitamin B-12 729 211 - 911 pg/mL  ?Hemoglobin A1c  ?Result Value Ref Range  ? Hgb A1c MFr Bld 6.3 4.6 - 6.5 %  ?VITAMIN D 25 Hydroxy (Vit-D Deficiency, Fractures)  ?Result Value Ref Range  ? VITD 26.82 (L) 30.00 - 100.00 ng/mL  ? ? ?Assessment & Plan:  ? ?Problem List Items Addressed This Visit   ? ? Type 2 diabetes mellitus with vascular disease ([place])  ?  Diabetes is well controlled only on glyburide. ? ?  ?  ? Blister of left foot - Primary  ?  Patient has developed acute blister to left foot.  ?Neuropathy increased risk risk of this. ?Given diabetic, will cover with Keflex course. ?Update if not improving with treatment.  Red flags to seek further care reviewed. ? ?  ?  ?  ? ?Meds ordered this encounter  ?Medications  ? cephALEXin (KEFLEX) 500 MG capsule  ?  Sig: Take 1 capsule (500 mg total) by mouth 3 (three) times daily.  ?  Dispense:  15 capsule  ?  Refill:  0  ? ?No orders of the defined types were placed in this encounter. ? ? ? ?Patient Instructions  ?You've developed a blister to left foot - possibly from friction from shoe rubbing on skin when working in the yard, possibly from bug bite.  ?Take antibiotic to prevent infection.  ?Elevate foot, keep area clean and dry  ?Let us know right away if not improving with treatment, or if any signs of infection (spreading redness, warmth, fever, or draining pus).  ? ?Follow up plan: ?Return if symptoms worsen or fail to improve. ? ?Ria Bush, MD  ?

## 2021-09-20 NOTE — Patient Instructions (Signed)
You've developed a blister to left foot - possibly from friction from shoe rubbing on skin when working in the yard, possibly from bug bite.  ?Take antibiotic to prevent infection.  ?Elevate foot, keep area clean and dry  ?Let us know right away if not improving with treatment, or if any signs of infection (spreading redness, warmth, fever, or draining pus).  ?

## 2021-09-20 NOTE — Progress Notes (Signed)
Carelink Summary Report / Loop Recorder 

## 2021-09-20 NOTE — Assessment & Plan Note (Signed)
Diabetes is well controlled only on glyburide. ?

## 2021-09-20 NOTE — Assessment & Plan Note (Signed)
Patient has developed acute blister to left foot.  ?Neuropathy increased risk risk of this. ?Given diabetic, will cover with Keflex course. ?Update if not improving with treatment.  Red flags to seek further care reviewed. ?

## 2021-09-26 ENCOUNTER — Other Ambulatory Visit: Payer: Self-pay | Admitting: Family Medicine

## 2021-09-28 ENCOUNTER — Encounter: Payer: Self-pay | Admitting: Family Medicine

## 2021-09-28 ENCOUNTER — Ambulatory Visit (INDEPENDENT_AMBULATORY_CARE_PROVIDER_SITE_OTHER)
Admission: RE | Admit: 2021-09-28 | Discharge: 2021-09-28 | Disposition: A | Discharge status: Home / Self Care | Payer: Medicare PPO | Source: Ambulatory Visit | Attending: Family Medicine | Admitting: Family Medicine

## 2021-09-28 ENCOUNTER — Ambulatory Visit: Payer: Medicare PPO | Admitting: Family Medicine

## 2021-09-28 VITALS — BP 140/76 | HR 81 | Temp 97.6°F | Ht 65.0 in | Wt 154.1 lb

## 2021-09-28 DIAGNOSIS — M85872 Other specified disorders of bone density and structure, left ankle and foot: Secondary | ICD-10-CM | POA: Diagnosis not present

## 2021-09-28 DIAGNOSIS — S93412A Sprain of calcaneofibular ligament of left ankle, initial encounter: Secondary | ICD-10-CM

## 2021-09-28 DIAGNOSIS — M7989 Other specified soft tissue disorders: Secondary | ICD-10-CM | POA: Diagnosis not present

## 2021-09-28 DIAGNOSIS — M25572 Pain in left ankle and joints of left foot: Secondary | ICD-10-CM | POA: Diagnosis not present

## 2021-09-28 DIAGNOSIS — G629 Polyneuropathy, unspecified: Secondary | ICD-10-CM | POA: Diagnosis not present

## 2021-09-28 NOTE — Progress Notes (Signed)
? ? ?[name] Jiovanna Frei, MD, [facility] ?Therapist, music at [facility] ?[place] ?[place], [address] ? ?Phone: [phone]  FAX: [phone] ? ?Maria [name] - 81 y.o. female  MRN [id number]  Date of Birth: [birth year] ? ?Date: 09/28/2021  PCP: Tonia Ghent, MD  Referral: Tonia Ghent, MD ? ?Chief Complaint  ?Patient presents with  ? Foot Pain  ?  Left  ? ?Subjective:  ? ?Maria [name] is a 81 y.o. very pleasant female patient with Body mass index is 25.64 kg/m?. who presents with the following: ? ?Pleasant lady, she presents with some left-sided foot pain.  She saw my partner Dr. Danise Mina on May 9 with concerns for left-sided foot blister. ? ?At that time, there was some concerns about a blister between the fourth and fifth toe.  At this point this is healing and globally improving. ? ?Roughly 2 days ago she started to have some acute pain in the lateral ankle.  She is not sure about any specific trauma, but she does have numbness and neuropathy in her distal lower extremities.  She is walking now with a cane and is having a limp.  Per the patient and her husband, this is more than would be typical and expected. ? ?Pain isolates to the lateral ankle, she does have some just proximal to this as well.  No significant pain in the midfoot or forefoot.  He has no pain medially ? ?L ankle, no known injury. ? ?Started to have some pain 2 days ago in the night.  ? ? ? ?Review of Systems is noted in the HPI, as appropriate ? ?Objective:  ? ?BP 140/76   Pulse 81   Temp 97.6 ?F (36.4 ?C) (Oral)   Ht _0  (1.651 m)   Wt 154 lb 1 oz (69.9 kg)   SpO2 97%   BMI 25.64 kg/m?  ? ?GEN: No acute distress; alert,appropriate. ?PULM: Breathing comfortably in no respiratory distress ?PSYCH: Normally interactive.  ? ?Swelling and tenderness at the lateral malleolus.  There is tenderness inferior to this at the region of the CFL.  Deltoid ligaments not significantly tender.  ATFL is  minimally tender.  Drawer testing is stable.  Subtalar tilt is stable as well as Kleiger exam. ? ?She does have some mild tenderness at the talus. ?Otherwise forefoot and midfoot are nontender ? ? ? ? ?Laboratory and Imaging Data: ?X-ray, Ankle: AP, Lateral, and Mortise Views ?Indication: Ankle pain ?Findings: There is no evidence for acute fracture or dislocation. The mortise appears preserved.  Patient does have some ankle joint arthritis as well as some midfoot arthritis.  Otherwise, grossly normal ankle film. Electronically Signed  By: Owens Loffler, MD On: 09/28/2021 10:40 AM EDT  ? ?Assessment and Plan:  ? ?  ICD-10-CM   ?1. Sprain of calcaneofibular ligament of left ankle, initial encounter  [id number]   ?  ?2. Acute left ankle pain  M25.572 DG Ankle Complete Left  ?  ?3. Neuropathy  G62.9   ?  ? ?CFL sprain.  She also is having some acute on chronic foot and ankle pain with known significant lower extremity neuropathy. ? ?I do not appreciate any acute fracture.  She does have some ankle and midfoot arthritis, question if this could potentially be causing some increased pain, and both neuropathy and arthritic changes could increase length of time for recovery. ? ?She does have a healing blister between her fourth and fifth digits.  At this time, I would not want to cover this at all.  I think at this point elevation with ice is likely all that is needed.  If she is having difficulty then I traditional Aircast would be appropriate. ? ?Social: This is causing her some impairment in her ability to walk, let alone exercise ? ?Medication Management during today's office visit: ?No orders of the defined types were placed in this encounter. ? ?Medications Discontinued During This Encounter  ?Medication Reason  ? cephALEXin (KEFLEX) 500 MG capsule Completed Course  ? ? ?Orders placed today for conditions managed today: ?Orders Placed This Encounter  ?Procedures  ? DG Ankle Complete Left  ? ? ?Follow-up if needed: No  follow-ups on file. ? ?Dragon [employer] One speech-to-text software was used for transcription in this dictation.  Possible transcriptional errors can occur using Editor, commissioning.  ? ?Signed, ? ?Idriss Quackenbush T. Tylia Ewell, MD ? ? ?Outpatient Encounter Medications as of 09/28/2021  ?Medication Sig  ? aspirin EC 81 MG tablet Take 81 mg by mouth daily. Swallow whole.  ? Bempedoic Acid-Ezetimibe (NEXLIZET) 180-10 MG TABS Take 1 tablet by mouth daily.  ? Blood Glucose Monitoring Suppl (ACCU-CHEK AVIVA PLUS) w/Device KIT 1 kit by Does not apply route as directed.  ? cyanocobalamin (,VITAMIN B-12,) 1000 MCG/ML injection 1000 mcg injected IM every 14 days.  ? diclofenac Sodium (VOLTAREN) 1 % GEL Apply 2 g topically 4 (four) times daily as needed.  ? glucose blood test strip Use as instructed, twice daily to check blood glucose.  Dx E11.9.  ? glyBURIDE (DIABETA) 5 MG tablet TAKE 1 TO 2 TABLETS BY MOUTH DAILY WITH BREAKFAST.  ? loperamide (IMODIUM) 2 MG capsule Take 2 mg by mouth 4 (four) times daily as needed for diarrhea or loose stools.   ? nitroGLYCERIN (NITROSTAT) 0.4 MG SL tablet PLACE 1 TABLET UNDER THE TONGUE EVERY 5 MINUTES AS NEEDED FOR CHEST PAIN FOR UP TO 3 DOSES  ? omeprazole (PRILOSEC) 20 MG capsule Take 1 capsule (20 mg total) by mouth 2 (two) times daily as needed (for acid reflux/indigestion.).  ? oxyCODONE (OXY IR/ROXICODONE) 5 MG immediate release tablet Take 1 tablet (5 mg total) by mouth every 6 (six) hours as needed for moderate pain.  ? traMADol (ULTRAM) 50 MG tablet Take 1 tablet (50 mg total) by mouth every 6 (six) hours as needed (do not take wtih oxycodone).  ? Vitamin D, Ergocalciferol, (DRISDOL) 1.25 MG (50000 UNIT) CAPS capsule Take 1 capsule (50,000 Units total) by mouth every 7 (seven) days.  ? [DISCONTINUED] cephALEXin (KEFLEX) 500 MG capsule Take 1 capsule (500 mg total) by mouth 3 (three) times daily.  ? ?No facility-administered encounter medications on file as of 09/28/2021.  ?  ?

## 2021-10-04 NOTE — Progress Notes (Signed)
Cardiology Office Note    Date:  10/13/2021   ID:  Maria [name], DOB 1941-03-10, MRN [id number]  PCP:  Tonia Ghent, MD  Cardiologist:  Jenkins Rouge, MD  Electrophysiologist:  None    History of Present Illness:   Maria [name] is a 81 y.o. female with history of CAD s/p DES to CX 2016, HTN, DM, GERD, HLD (intolerant of statins, hoarse voice with Repatha, myalgias with Praluent), SVT s/p ablation 2016, sinus pause by monitoring 2016 (BB stopped at that time), prior episode of syncope 2020 (hypoglycemia, possible orthostasis/vagal related to back pain), falls, RBBB/LAFB, mild carotid disease (1-39% BICA 2016), stroke s/p ILR. Last cath was in 2017 showing 30% prox RCA, 25% D1, patent LCx stent, normal LV function. Last stress test in 01/2018 was low risk, probable normal perfusion and mild soft tissue attenuation (diaphragm), no ischemia or scar.   April 2022 admitted  with stroke manifested by confusion, slurred speech and word finding difficulty. Cerebrovascular disease was noted on CTA with distal A2 occlusion. CT head showed no acute finding, 7 mm hemangioma overlying the right frontal convexity without associated mass-effect. MRI small acute on chronic white matter infarct at the right corona radiata, right superior frontal gyrus. 2D Echo 08/19/20 showed EF 60-65%, grade 2 DD, elevated LVEDP, mild aortic sclerosis without stenosis. Loop recorder was placed by EP team. Neurology recommended ASA 325 and Plavix 75 for 3 months and then ASA alone given large vessel stenosis/occlusion.  Loop with no PAF to date She struggles with chronic peripheral neuropathy, balance difficulty and falls. She walks with a walker. As a result, she self-discontinued her Plavix out of fear of falling/bleeding. She has trouble affording her Nexlizet   No angina/palpitations PACEART reviewed 09/05/21 normal   Seen by primary 09/28/21 with left foot pain in conjunction with her neuropathy and blister between 4/5 th  toe Xray only arthritis no fracture She is not concerned about blister   Has lived in same house for 62 years    Labwork independently reviewed: 08/2020 K 4.0, Cr 0.77, TSH wnl, Hgb 11.8 (similar to last value), Mg 1.6, LDL 127, A1C 9.4 08/2019 LFTs wnl   Past Medical History:  Diagnosis Date   Anemia 05/1985   Carotid artery disease ([place])    a. 02/2015 - 1-39% bilaterally.   Chronic back pain    Concussion    Coronary artery disease    a. s/p DES to [facility] 01/2015.   DDD (degenerative disc disease), cervical    DDD (degenerative disc disease), lumbar    Fracture of lower leg 07/2001   Right   GERD (gastroesophageal reflux disease)    Hyperlipidemia    Hypertension    Multinodular thyroid    Pneumonia ?1990's X 1   PSVT (paroxysmal supraventricular tachycardia) ([place])    a. s/p catheter ablation of a parahisian atrial tachycardia which was successfully ablated from the non-coronary cusp of the aortic root 06/2014.   RBBB with left anterior fascicular block    Renal cyst 07/10/2006   bilateral   Right knee pain 02/2010   Injected - Dr. Tonita Cong   Sinus pause    a. By event monitoring 09/2014 - BB stopped at that time.   Statin intolerance    Stroke [facility])    Syncope and collapse 07/2001   "that's when I broke my leg"   Type II diabetes mellitus ([place]) 2002    Past Surgical History:  Procedure Laterality Date   ANTERIOR  CERVICAL DECOMP/DISCECTOMY FUSION  10/03/2005   C4/5; C5/6  "it's got a plate in there"   ANTERIOR LAT LUMBAR FUSION Left 02/04/2018   Procedure: Lumbar two-three Lumbar three-four Lumbar four-five  Anterolateral decompression/percutaneous posterior arthrodesis, [place];  Surgeon: Kristeen Miss, MD;  Location: [place];  Service: Neurosurgery;  Laterality: Left;   APPLICATION OF ROBOTIC ASSISTANCE FOR SPINAL PROCEDURE N/A 02/04/2018   Procedure: APPLICATION OF ROBOTIC ASSISTANCE FOR SPINAL PROCEDURE;  Surgeon: Kristeen Miss, MD;  Location: [place];  Service: Neurosurgery;   Laterality: N/A;   BACK SURGERY     BREAST BIOPSY Bilateral 1968 (multiple)   "all benign"   BREAST LUMPECTOMY Bilateral 1968   CARDIAC CATHETERIZATION N/A 11/18/2014   Procedure: Left Heart Cath and Coronary Angiography;  Surgeon: Brylinn Teaney M Martinique, MD;  Location: [place] CV LAB;  Service: Cardiovascular;  Laterality: N/A;   CARDIAC CATHETERIZATION N/A 01/22/2015   Procedure: Coronary Stent Intervention;  Surgeon: Anaily Ashbaugh M [place]ique, MD;  Location: [place] CV LAB;  Service: Cardiovascular;  Laterality: N/A;   CARDIAC CATHETERIZATION N/A 06/10/2015   Procedure: Left Heart Cath and Coronary Angiography;  Surgeon: Miko Sirico M Martinique, MD;  Location: [place] CV LAB;  Service: Cardiovascular;  Laterality: N/A;   CATARACT EXTRACTION W/ INTRAOCULAR LENS  IMPLANT, BILATERAL Bilateral 12/2010   CORONARY ANGIOPLASTY     ESOPHAGOGASTRODUODENOSCOPY  08/14/2005   gastropathy biopsy, negative   LAPAROSCOPIC CHOLECYSTECTOMY  08/1992   LOOP RECORDER INSERTION N/A 08/19/2020   Procedure: LOOP RECORDER INSERTION;  Surgeon: Evans Lance, MD;  Location: [place] CV LAB;  Service: Cardiovascular;  Laterality: N/A;   LUMBAR PERCUTANEOUS PEDICLE SCREW 3 LEVEL N/A 02/04/2018   Procedure: LUMBAR PERCUTANEOUS PEDICLE SCREW LUMBAR TWO - LUMBAR FIVE;  Surgeon: Kristeen Miss, MD;  Location: [place];  Service: Neurosurgery;  Laterality: N/A;   POSTERIOR LAMINECTOMY / DECOMPRESSION LUMBAR SPINE  09/2001   due to herniated disc   SUPRAVENTRICULAR TACHYCARDIA ABLATION N/A 07/06/2014   Procedure: SUPRAVENTRICULAR TACHYCARDIA ABLATION;  Surgeon: Evans Lance, MD;  Location[facility] C[facility] LAB;  Service: Cardiovascular;  Laterality: N/A;   VAGINAL HYSTERECTOMY  1968   spotting    Current Medications: Current Meds  Medication Sig   aspirin EC 81 MG tablet Take 81 mg by mouth daily. Swallow whole.   Bempedoic Acid-Ezetimibe (NEXLIZET) 180-10 MG TABS Take 1 tablet by mouth daily.   Blood Glucose Monitoring Suppl (ACCU-CHEK AVIVA  PLUS) w/Device KIT 1 kit by Does not apply route as directed.   cyanocobalamin (,VITAMIN B-12,) 1000 MCG/ML injection 1000 mcg injected IM every 14 days.   diclofenac Sodium (VOLTAREN) 1 % GEL Apply 2 g topically 4 (four) times daily as needed.   glucose blood test strip Use as instructed, twice daily to check blood glucose.  Dx E11.9.   glyBURIDE (DIABETA) 5 MG tablet TAKE 1 TO 2 TABLETS BY MOUTH DAILY WITH BREAKFAST.   loperamide (IMODIUM) 2 MG capsule Take 2 mg by mouth 4 (four) times daily as needed for diarrhea or loose stools.    nitroGLYCERIN (NITROSTAT) 0.4 MG SL tablet PLACE 1 TABLET UNDER THE TONGUE EVERY 5 MINUTES AS NEEDED FOR CHEST PAIN FOR UP TO 3 DOSES   omeprazole (PRILOSEC) 20 MG capsule Take 1 capsule (20 mg total) by mouth 2 (two) times daily as needed (for acid reflux/indigestion.).   oxyCODONE (OXY IR/ROXICODONE) 5 MG immediate release tablet Take 1 tablet (5 mg total) by mouth every 6 (six) hours as needed for moderate pain.   traMADol (  ULTRAM) 50 MG tablet Take 1 tablet (50 mg total) by mouth every 6 (six) hours as needed (do not take wtih oxycodone).   Vitamin D, Ergocalciferol, (DRISDOL) 1.25 MG (50000 UNIT) CAPS capsule Take 1 capsule (50,000 Units total) by mouth every 7 (seven) days.      Allergies:   Crestor [rosuvastatin], Ezetimibe-simvastatin, Pravastatin, Diazepam, Lyrica [pregabalin], Nsaids, Praluent [alirocumab], Repatha [evolocumab], Acetaminophen, Carvedilol, Gabapentin, Ibuprofen, Insulin glargine, Levemir [insulin detemir], Macrobid [nitrofurantoin macrocrystal], Metformin, Metoprolol tartrate, and Naproxen   Social History   Socioeconomic History   Marital status: Married    Spouse name: Not on file   Number of children: 2   Years of education: Not on file   Highest education level: Not on file  Occupational History   Occupation: Retired; Doctor, general practice, [place]    Employer: RETIRED    Comment: Works a couple days a week   Tobacco Use   Smoking status: Never   Smokeless tobacco: Never  Scientific laboratory technician Use: Never used  Substance and Sexual Activity   Alcohol use: No    Alcohol/week: 0.0 standard drinks   Drug use: No   Sexual activity: Yes  Other Topics Concern   Not on file  Social History Narrative   Widowed 2011 after 45 years of marriage (husband had C.diff)   Remarried 2015   Right handed   One story home   Occasional caffeine   Social Determinants of Health   Financial Resource Strain: Low Risk    Difficulty of Paying Living Expenses: Not hard at all  Food Insecurity: No Food Insecurity   Worried About Charity fundraiser in the Last Year: Never true   [place] in the Last Year: Never true  Transportation Needs: No Transportation Needs   Lack of Transportation (Medical): No   Lack of Transportation (Non-Medical): No  Physical Activity: Inactive   Days of Exercise per Week: 0 days   Minutes of Exercise per Session: 0 min  Stress: No Stress Concern Present   Feeling of Stress : Not at all  Social Connections: Moderately Integrated   Frequency of Communication with Friends and Family: More than three times a week   Frequency of Social Gatherings with Friends and Family: More than three times a week   Attends Religious Services: More than 4 times per year   Active Member of [employer] or Organizations: No   Attends Music therapist: Never   Marital Status: Married     Family History:  The patient's family history includes Breast cancer in her maternal aunt; Depression in her paternal aunt; Diabetes in her brother, maternal grandmother, paternal grandfather, paternal grandmother, and sister; Heart disease in her maternal grandfather and mother; Hypertension in her maternal grandfather, maternal grandmother, mother, paternal grandfather, and paternal grandmother; Myasthenia gravis in her sister; Stroke in her maternal grandmother. There is no history of Colon  cancer.  ROS:   Please see the history of present illness.  All other systems are reviewed and otherwise negative.    EKGs/Labs/Other Studies Reviewed:    Studies reviewed are outlined and summarized above. Reports included below if pertinent.  2D Echo 08/19/20   1. Left ventricular ejection fraction, by estimation, is 60 to 65%. The  left ventricle has normal function. The left ventricle has no regional  wall motion abnormalities. Left ventricular diastolic parameters are  consistent with Grade II diastolic  dysfunction (pseudonormalization). Elevated left ventricular end-diastolic  pressure.  2. Right ventricular systolic function is normal. The right ventricular  size is normal. There is normal pulmonary artery systolic pressure.   3. The mitral valve is abnormal. No evidence of mitral valve  regurgitation. No evidence of mitral stenosis.   4. The aortic valve is tricuspid. There is mild calcification of the  aortic valve. Aortic valve regurgitation is not visualized. Mild aortic  valve sclerosis is present, with no evidence of aortic valve stenosis.   5. The inferior vena cava is normal in size with greater than 50%  respiratory variability, suggesting right atrial pressure of 3 mmHg.   LHC 05/2015  Prox RCA lesion, 30% stenosed. 1st Diag lesion, 35% stenosed. The left ventricular systolic function is normal.   1. Nonobstructive CAD. Continued patency of the stent in the LCx. 2. Normal LV function.   Continue medical therapy. Consider switching Brilinta to alternative antiplatelet agent.     EKG:  10/13/2021 SR rate 83 RBBB LAD    Recent Labs: 11/23/2020: ALT 12; Hemoglobin 12.8; Platelets 379.0 07/18/2021: BUN 18; Creatinine, Ser 0.96; Potassium 4.2; Sodium 141  Recent Lipid Panel    Component Value Date/Time   CHOL 130 11/23/2020 0840   CHOL 188 03/31/2019 0832   TRIG 115.0 11/23/2020 0840   HDL 36.70 (L) 11/23/2020 0840   HDL 59 03/31/2019 0832   CHOLHDL 4  11/23/2020 0840   VLDL 23.0 11/23/2020 0840   LDLCALC 70 11/23/2020 0840   LDLCALC 81 08/14/2019 1414   LDLDIRECT 168.1 02/18/2010 0751    PHYSICAL EXAM:    VS:  BP (!) 156/98   Pulse 83   Ht 5' 5"  (1.651 m)   Wt 153 lb (69.4 kg)   SpO2 98%   BMI 25.46 kg/m   BMI: Body mass index is 25.46 kg/m.  Affect appropriate Healthy:  appears stated age 69: normal Neck supple with no adenopathy JVP normal no bruits no thyromegaly Lungs clear with no wheezing and good diaphragmatic motion Heart:  S1/S2 no murmur, no rub, gallop or click PMI normal Abdomen: benighn, BS positve, no tenderness, no AAA no bruit.  No HSM or HJR Distal pulses intact with no bruits No edema Neuro non-focal Skin warm and dry No muscular weakness   Wt Readings from Last 3 Encounters:  10/13/21 153 lb (69.4 kg)  09/28/21 154 lb 1 oz (69.9 kg)  09/20/21 156 lb 8 oz (71 kg)     ASSESSMENT & PLAN:   1. Stroke:  cryptogenic no PAF on Linq carotids ok I She prefers to take ASA alone   2. CAD :  stable no angina patent LCX stent cath 2017 non ischemic myovue 01/21/2018  3. History of SVT - quiescent. Will be monitored by ILR now.   4. HLD:  discussed compliance with Nexlizet   5. DM:  Discussed low carb diet.  Target hemoglobin A1c is 6.5 or less.  Continue current medications.   Disposition: F/u in a year   Medication Adjustments/Labs and Tests Ordered: Current medicines are reviewed at length with the patient today.  Concerns regarding medicines are outlined above. Medication changes, Labs and Tests ordered today are summarized above and listed in the Patient Instructions accessible in Encounters.   Signed, Jenkins Rouge, MD  10/13/2021 9:52 AM    [address], [place], [place]  [address] Phone: [phone]; Fax: [phone]

## 2021-10-10 LAB — CUP PACEART REMOTE DEVICE CHECK
Date Time Interrogation Session: 20230524231607
Implantable Pulse Generator Implant Date: 20220407

## 2021-10-11 ENCOUNTER — Ambulatory Visit (INDEPENDENT_AMBULATORY_CARE_PROVIDER_SITE_OTHER): Payer: Medicare PPO

## 2021-10-11 DIAGNOSIS — I639 Cerebral infarction, unspecified: Secondary | ICD-10-CM

## 2021-10-13 ENCOUNTER — Encounter: Payer: Self-pay | Admitting: Cardiovascular Disease

## 2021-10-13 ENCOUNTER — Ambulatory Visit: Payer: Medicare PPO | Admitting: Cardiovascular Disease

## 2021-10-13 VITALS — BP 156/98 | HR 83 | Ht 65.0 in | Wt 153.0 lb

## 2021-10-13 DIAGNOSIS — I2510 Atherosclerotic heart disease of native coronary artery without angina pectoris: Secondary | ICD-10-CM

## 2021-10-13 DIAGNOSIS — E782 Mixed hyperlipidemia: Secondary | ICD-10-CM | POA: Diagnosis not present

## 2021-10-13 DIAGNOSIS — E785 Hyperlipidemia, unspecified: Secondary | ICD-10-CM

## 2021-10-13 NOTE — Patient Instructions (Signed)
Medication Instructions:  Your physician recommends that you continue on your current medications as directed. Please refer to the Current Medication list given to you today.  *If you need a refill on your cardiac medications before your next appointment, please call your pharmacy*  Lab Work: If you have labs (blood work) drawn today and your tests are completely normal, you will receive your results only by: [place] (if you have MyChart) OR A paper copy in the mail If you have any lab test that is abnormal or we need to change your treatment, we will call you to review the results.  Testing/Procedures: None order today.  Follow-Up: At [facility], you and your health needs are our priority.  As part of our continuing mission to provide you with exceptional heart care, we have created designated Provider Care Teams.  These Care Teams include your primary Cardiologist (physician) and Advanced Practice Providers (APPs -  Physician Assistants and Nurse Practitioners) who all work together to provide you with the care you need, when you need it.  We recommend signing up for the patient portal called "MyChart".  Sign up information is provided on this After Visit Summary.  MyChart is used to connect with patients for Virtual Visits (Telemedicine).  Patients are able to view lab/test results, encounter notes, upcoming appointments, etc.  Non-urgent messages can be sent to your provider as well.   To learn more about what you can do with MyChart, go to [email or web address].    Your next appointment:   1 year(s)  The format for your next appointment:   In Person  Provider:   Jenkins Rouge, MD {   Important Information About Sugar

## 2021-10-26 ENCOUNTER — Other Ambulatory Visit: Payer: Self-pay | Admitting: Cardiovascular Disease

## 2021-10-27 NOTE — Progress Notes (Signed)
Carelink Summary Report / Loop Recorder 

## 2021-11-07 ENCOUNTER — Telehealth: Payer: Self-pay | Admitting: Pharmacist

## 2021-11-08 MED ORDER — OXYCODONE HCL 5 MG PO TABS: 5.0000 mg | ORAL_TABLET | Freq: Four times a day (QID) | ORAL | 0 refills | Status: DC | PRN

## 2021-11-14 ENCOUNTER — Ambulatory Visit (INDEPENDENT_AMBULATORY_CARE_PROVIDER_SITE_OTHER): Payer: Medicare PPO

## 2021-11-14 DIAGNOSIS — I639 Cerebral infarction, unspecified: Secondary | ICD-10-CM

## 2021-11-15 LAB — CUP PACEART REMOTE DEVICE CHECK
Date Time Interrogation Session: 20230702230700
Implantable Pulse Generator Implant Date: 20220407

## 2021-11-22 DIAGNOSIS — H524 Presbyopia: Secondary | ICD-10-CM | POA: Diagnosis not present

## 2021-11-22 DIAGNOSIS — E119 Type 2 diabetes mellitus without complications: Secondary | ICD-10-CM | POA: Diagnosis not present

## 2021-11-22 LAB — HM DIABETES EYE EXAM

## 2021-11-23 ENCOUNTER — Other Ambulatory Visit: Payer: Self-pay | Admitting: Family Medicine

## 2021-11-23 NOTE — Telephone Encounter (Signed)
Refill request for Tramadol 50 mg tablets and Vit D 50,000 unit caps  LOV - 09/28/21 Next OV - not scheduled Last refill - Tramadol 08/21/21 #60/2                  Vitamin D 09/18/21 #12/0; last lab 09/15/21 and was 26.82

## 2021-11-23 NOTE — Telephone Encounter (Signed)
Sent. Thanks.  Would continue both now and we can recheck labs at yearly visit this fall.

## 2021-11-29 ENCOUNTER — Telehealth: Payer: Self-pay | Admitting: Family Medicine

## 2021-11-29 MED ORDER — OXYCODONE HCL 5 MG PO TABS: 5.0000 mg | ORAL_TABLET | Freq: Four times a day (QID) | ORAL | 0 refills | Status: DC | PRN

## 2021-11-29 NOTE — Telephone Encounter (Signed)
Sent. Thanks.   

## 2021-11-29 NOTE — Telephone Encounter (Signed)
LOV - 09/28/21 NOV - not scheduled RF - 11/08/21 #30/0

## 2021-11-29 NOTE — Telephone Encounter (Signed)
  Encourage patient to contact the pharmacy for refills or they can request refills through [facility]  Did the patient contact the pharmacy: No   LAST APPOINTMENT DATE: 09/28/21  NEXT APPOINTMENT DATE: N/A  MEDICATION: oxyCODONE (OXY IR/ROXICODONE) 5 MG immediate release tablet  Is the patient out of medication? Yes  PHARMACY: [facility] - [place], [place] - [address]  Let patient know to contact pharmacy at the end of the day to make sure medication is ready.  Please notify patient to allow 48-72 hours to process

## 2021-11-29 NOTE — Addendum Note (Signed)
Addended by: Joaquim Nam on: 11/29/2021 01:39 PM   Modules accepted: Orders

## 2021-12-08 NOTE — Progress Notes (Signed)
Carelink Summary Report / Loop Recorder 

## 2021-12-13 ENCOUNTER — Telehealth: Payer: Self-pay

## 2021-12-13 NOTE — Progress Notes (Signed)
Chronic Care Management Pharmacy Assistant   Name: Maria [name]  MRN: [id number] DOB: 11-13-1940  Reason for Encounter: CCM (Diabetes Disease State)  Recent office visits:  09/15/21 Elsie Stain, MD Osteoporosis Abnormal Labs: "B12 level is normal.  I would continue as is with replacement. Vitamin D is low.  I would defer treatment for osteoporosis until we get her vitamin D level normal.  A1c is slightly higher but reasonable at 6.3. I think it makes sense to schedule yearly visit for later this summer, at least 3 months from now.  We can recheck her labs at that point (or before the visit if possible).  In the meantime I would take vitamin D weekly.  I sent the prescription and I put in the follow-up lab orders. Administered: cyanocobalamin ((VITAMIN B-12)) injection 1,000 mcg  08/31/21 Administered: cyanocobalamin ((VITAMIN B-12)) injection 1,000 mcg 08/17/21 Administered: cyanocobalamin ((VITAMIN B-12)) injection 1,000 mcg 08/03/21 Administered: cyanocobalamin ((VITAMIN B-12)) injection 1,000 mcg 07/26/21 AWV 07/18/21 Elsie Stain, MD DM Administered: cyanocobalamin ((VITAMIN B-12)) injection 1,000 mcg 05/03/2021 - Elsie Stain, MD - Patient presented for diabetes. A1c 5.9%. If any lows at night, then skip or stop the evening dose of glyburide.  Otherwise continue glyburide as is for now. B12 injection. Follow up 6 months. 01/13/2021 - Elsie Stain, MD - Telephone - Patient presented for burn upon urination. Ordered: Urinalysis and Urine culture. Start: sulfamethoxazole-trimethoprim (BACTRIM DS) 800-160 MG tablet.  12/23/2020 - Elsie Stain, MD - Video Visit - Patient presented for Covid. Start: molnupiravir EUA 200 mg CAPS.   Recent consult visits:  10/13/21 Jenkins Rouge, MD (Cardiology) Coronary artery disease  No med changes FU 1 year 09/28/21 Owens Loffler, MD Sprain of calcaneofibular ligament of left ankle Ordered: DG Ankle Stop (completed: Cephalexin 500 mg 09/20/21  Ria Bush, MD Blister of left foot Start: Cephalexin 500 mg  09/01/21 Mobile Dexa Scan 09/01/21 Mobile BCG 10 04/26/2021 - Debbora Dus, [facility] - Telephone - Accu Chek Glucometer and test strips ordered. 04/19/2021 - Jenkins Rouge, MD - Cardiology - Patient presented for history of stroke. No medication changes  Hospital visits:  None in previous 6 months  Medications: Outpatient Encounter Medications as of 12/13/2021  Medication Sig   aspirin EC 81 MG tablet Take 81 mg by mouth daily. Swallow whole.   Blood Glucose Monitoring Suppl (ACCU-CHEK AVIVA PLUS) w/Device KIT 1 kit by Does not apply route as directed.   cyanocobalamin (,VITAMIN B-12,) 1000 MCG/ML injection 1000 mcg injected IM every 14 days.   diclofenac Sodium (VOLTAREN) 1 % GEL Apply 2 g topically 4 (four) times daily as needed.   glucose blood test strip Use as instructed, twice daily to check blood glucose.  Dx E11.9.   glyBURIDE (DIABETA) 5 MG tablet TAKE 1 TO 2 TABLETS BY MOUTH DAILY WITH BREAKFAST.   loperamide (IMODIUM) 2 MG capsule Take 2 mg by mouth 4 (four) times daily as needed for diarrhea or loose stools.    NEXLIZET 180-10 MG TABS TAKE 1 TABLET BY MOUTH DAILY.   nitroGLYCERIN (NITROSTAT) 0.4 MG SL tablet PLACE 1 TABLET UNDER THE TONGUE EVERY 5 MINUTES AS NEEDED FOR CHEST PAIN FOR UP TO 3 DOSES   omeprazole (PRILOSEC) 20 MG capsule Take 1 capsule (20 mg total) by mouth 2 (two) times daily as needed (for acid reflux/indigestion.).   oxyCODONE (OXY IR/ROXICODONE) 5 MG immediate release tablet Take 1 tablet (5 mg total) by mouth every 6 (six) hours as needed for moderate pain.  traMADol (ULTRAM) 50 MG tablet TAKE 1 TABLET BY MOUTH EVERY 6 HOURS AS NEEDED (DO NOT TAKE WTIH OXYCODONE).   Vitamin D, Ergocalciferol, (DRISDOL) 1.25 MG (50000 UNIT) CAPS capsule TAKE 1 CAPSULE BY MOUTH EVERY 7 DAYS.   No facility-administered encounter medications on file as of 12/13/2021.     Recent Relevant Labs: Lab Results   Component Value Date/Time   HGBA1C 6.3 09/15/2021 03:58 PM   HGBA1C 5.9 (A) 05/03/2021 11:33 AM   HGBA1C 6.9 (H) 11/23/2020 08:40 AM   MICROALBUR 0.5 02/16/2009 09:31 AM   MICROALBUR 1.9 01/06/2008 09:47 AM    Kidney Function Lab Results  Component Value Date/Time   CREATININE 0.96 07/18/2021 11:30 AM   CREATININE 1.05 11/23/2020 08:40 AM   CREATININE 1.14 (H) 08/14/2019 02:14 PM   CREATININE 0.97 (H) 06/09/2015 10:12 AM   GFR 55.31 (L) 07/18/2021 11:30 AM   GFRNONAA >60 08/19/2020 06:06 AM   GFRAA >60 10/03/2018 11:45 AM   Contacted patient on 12/13/2021 to discuss diabetes disease state.   Current antihyperglycemic regimen:  Glyburide 5 mg - take 1-2 tablet daily (pt taking differently: takes 1/2 tablet (2/5 mg) BID) Accu Chek Aviva Plus   Patient verbally confirms she is taking the above medications as directed. Yes patient stated Glyburide works better for her is she splits it up and takes 1/2 in the morning and 1/2 in the evening.   What recent interventions/DTPs have been made to improve glycemic control:  No recent interventions  Have there been any recent hospitalizations or ED visits since last visit with CPP? No  Patient denies hypoglycemic symptoms, including Pale, Sweaty, Shaky, Hungry, Nervous/irritable, and Vision changes  Patient denies hyperglycemic symptoms, including blurry vision, excessive thirst, fatigue, polyuria, and weakness  How often are you checking your blood sugar? once daily  What are your blood sugars ranging? I have asked patient to take their blood glucose daily and keep a log. Advised patient I would call back on 12/16/21 for log. Patient verbalized understanding and agreed.   During the week, how often does your blood glucose drop below 70? Never  Are you checking your feet daily/regularly? Yes  Adherence Review: Is the patient currently on a STATIN medication? No Is the patient currently on ACE/ARB medication? No Does the patient have  >5 day gap between last estimated fill dates? N/A  Care Gaps: Annual wellness visit in last year? Yes 07/26/21 Most recent A1C reading: 6.3 on 09/15/21 Most Recent BP reading: 159/98 on 10/13/2021  Last eye exam / retinopathy screening: Up to date Last diabetic foot exam: Up to date  Star Rating Drugs:  Medication:  Last Fill: Day Supply No star rating drugs noted  No appointments scheduled within the next 30 days. CCM appointment on 06/22/2022  Charlene Brooke, CPP notified  [name], [place] Clinical Pharmacy Assistant [phone]

## 2021-12-19 ENCOUNTER — Ambulatory Visit (INDEPENDENT_AMBULATORY_CARE_PROVIDER_SITE_OTHER): Payer: Medicare PPO

## 2021-12-19 DIAGNOSIS — I639 Cerebral infarction, unspecified: Secondary | ICD-10-CM

## 2021-12-19 LAB — CUP PACEART REMOTE DEVICE CHECK
Date Time Interrogation Session: 20230804231308
Implantable Pulse Generator Implant Date: 20220407

## 2021-12-28 ENCOUNTER — Telehealth: Payer: Self-pay

## 2021-12-28 NOTE — Progress Notes (Signed)
Complete

## 2021-12-28 NOTE — Telephone Encounter (Signed)
-----   Message from Claudina Lick, [place] sent at 12/28/2021  1:49 PM EDT ----- Regarding: 3 Month Follow-Up I noticed she had an appointment with Dr. Para March in May and patient needed to schedule a 3 month follow up in August. Patient has not done so and I am unable to schedule for Dr. Para March. Would you be able to schedule this for her? Thank you so much! Amy

## 2021-12-28 NOTE — Telephone Encounter (Signed)
Please call and schedule patients f/u that did not get scheduled.

## 2021-12-28 NOTE — Telephone Encounter (Signed)
In May PCP had recommended annual visit in 3 months (August), it does not look like appt has been made.  Per PCP results note 09/15/21: "Vitamin D is low.  I would defer treatment for osteoporosis until we get her vitamin D level normal.  A1c is slightly higher but reasonable at 6.3. I think it makes sense to schedule yearly visit for later this summer, at least 3 months from now.  We can recheck her labs at that point (or before the visit if possible)"

## 2021-12-28 NOTE — Telephone Encounter (Signed)
Patient scheduled.

## 2022-01-02 ENCOUNTER — Telehealth: Payer: Self-pay

## 2022-01-02 MED ORDER — OXYCODONE HCL 5 MG PO TABS: 5.0000 mg | ORAL_TABLET | Freq: Four times a day (QID) | ORAL | 0 refills | Status: DC | PRN

## 2022-01-02 NOTE — Telephone Encounter (Addendum)
Sent. Thanks.  I accidentally printed it but I shredded that and resent the rx.

## 2022-01-02 NOTE — Telephone Encounter (Signed)
Patient notified rx was sent 

## 2022-01-02 NOTE — Telephone Encounter (Signed)
LOV - 09/28/21 NOV - 01/19/22 RF - 11/29/21#30/0

## 2022-01-02 NOTE — Telephone Encounter (Signed)
MEDICATION: oxyCODONE (OXY IR/ROXICODONE) 5 MG immediate release tablet  PHARMACY: [facility] - [place], [place] - [address]  Comments: Patient is completely out, and would like a call if possible once it has been sent in.   **Let patient know to contact pharmacy at the end of the day to make sure medication is ready. **  ** Please notify patient to allow 48-72 hours to process**  **Encourage patient to contact the pharmacy for refills or they can request refills through [facility]**

## 2022-01-02 NOTE — Addendum Note (Signed)
Addended by: Joaquim Nam on: 01/02/2022 02:05 PM   Modules accepted: Orders

## 2022-01-06 ENCOUNTER — Telehealth: Payer: Self-pay

## 2022-01-06 NOTE — Progress Notes (Unsigned)
Reason for Encounter: CCM (Updated Blood Pressure Reading)  Contact patient for updated blood pressure reading. No answer; left message.  Lindsey Foltanski, CPP notified  Emagene Merfeld, RMA Clinical Pharmacy Assistant [phone]   

## 2022-01-18 NOTE — Progress Notes (Signed)
Carelink Summary Report / Loop Recorder 

## 2022-01-19 ENCOUNTER — Ambulatory Visit: Payer: Medicare PPO | Admitting: Family Medicine

## 2022-01-19 ENCOUNTER — Encounter: Payer: Self-pay | Admitting: Family Medicine

## 2022-01-19 DIAGNOSIS — M810 Age-related osteoporosis without current pathological fracture: Secondary | ICD-10-CM | POA: Diagnosis not present

## 2022-01-19 DIAGNOSIS — G894 Chronic pain syndrome: Secondary | ICD-10-CM | POA: Diagnosis not present

## 2022-01-19 DIAGNOSIS — E1159 Type 2 diabetes mellitus with other circulatory complications: Secondary | ICD-10-CM

## 2022-01-19 DIAGNOSIS — E538 Deficiency of other specified B group vitamins: Secondary | ICD-10-CM

## 2022-01-19 LAB — TSH: TSH: 2.35 u[IU]/mL (ref 0.35–5.50)

## 2022-01-19 LAB — LIPID PANEL
Cholesterol: 131 mg/dL (ref 0–200)
HDL: 41.1 mg/dL (ref >39.00)
LDL Cholesterol: 74 mg/dL (ref 0–99)
NonHDL: 90.34
Total CHOL/HDL Ratio: 3
Triglycerides: 82 mg/dL (ref 0.0–149.0)
VLDL: 16.4 mg/dL (ref 0.0–40.0)

## 2022-01-19 LAB — COMPREHENSIVE METABOLIC PANEL
ALT: 25 U/L (ref 0–35)
AST: 17 U/L (ref 0–37)
Albumin: 4.2 g/dL (ref 3.5–5.2)
Alkaline Phosphatase: 96 U/L (ref 39–117)
BUN: 19 mg/dL (ref 6–23)
CO2: 28 mEq/L (ref 19–32)
Calcium: 10 mg/dL (ref 8.4–10.5)
Chloride: 105 mEq/L (ref 96–112)
Creatinine, Ser: 0.99 mg/dL (ref 0.40–1.20)
GFR: 53.12 mL/min — ABNORMAL LOW (ref >60.00)
Glucose, Bld: 113 mg/dL — ABNORMAL HIGH (ref 70–99)
Potassium: 4.6 mEq/L (ref 3.5–5.1)
Sodium: 142 mEq/L (ref 135–145)
Total Bilirubin: 0.6 mg/dL (ref 0.2–1.2)
Total Protein: 6.7 g/dL (ref 6.0–8.3)

## 2022-01-19 LAB — CBC WITH DIFFERENTIAL/PLATELET
Basophils Absolute: 0 10*3/uL (ref 0.0–0.1)
Basophils Relative: 0.4 % (ref 0.0–3.0)
Eosinophils Absolute: 0.6 10*3/uL (ref 0.0–0.7)
Eosinophils Relative: 6.5 % — ABNORMAL HIGH (ref 0.0–5.0)
HCT: 38.8 % (ref 36.0–46.0)
Hemoglobin: 13 g/dL (ref 12.0–15.0)
Lymphocytes Relative: 27 % (ref 12.0–46.0)
Lymphs Abs: 2.4 10*3/uL (ref 0.7–4.0)
MCHC: 33.6 g/dL (ref 30.0–36.0)
MCV: 91.7 fl (ref 78.0–100.0)
Monocytes Absolute: 0.5 10*3/uL (ref 0.1–1.0)
Monocytes Relative: 5.8 % (ref 3.0–12.0)
Neutro Abs: 5.4 10*3/uL (ref 1.4–7.7)
Neutrophils Relative %: 60.3 % (ref 43.0–77.0)
Platelets: 407 10*3/uL — ABNORMAL HIGH (ref 150.0–400.0)
RBC: 4.23 Mil/uL (ref 3.87–5.11)
RDW: 13 % (ref 11.5–15.5)
WBC: 8.9 10*3/uL (ref 4.0–10.5)

## 2022-01-19 LAB — VITAMIN D 25 HYDROXY (VIT D DEFICIENCY, FRACTURES): VITD: 63.35 ng/mL (ref 30.00–100.00)

## 2022-01-19 LAB — VITAMIN B12: Vitamin B-12: 401 pg/mL (ref 211–911)

## 2022-01-19 LAB — HEMOGLOBIN A1C: Hgb A1c MFr Bld: 6.6 % — ABNORMAL HIGH (ref 4.6–6.5)

## 2022-01-19 MED ORDER — IBUPROFEN 200 MG PO TABS: 400.0000 mg | ORAL_TABLET | Freq: Two times a day (BID) | ORAL | Status: DC | PRN

## 2022-01-19 MED ORDER — OXYCODONE HCL 5 MG PO TABS: 5.0000 mg | ORAL_TABLET | Freq: Four times a day (QID) | ORAL | 0 refills | Status: DC | PRN

## 2022-01-19 MED ORDER — CYANOCOBALAMIN 1000 MCG/ML IJ SOLN
1000.0000 ug | Freq: Once | INTRAMUSCULAR | Status: AC
  Administered 2022-01-19: 1000 ug via INTRAMUSCULAR

## 2022-01-19 MED ORDER — GLYBURIDE 5 MG PO TABS: 2.5000 mg | ORAL_TABLET | Freq: Two times a day (BID) | ORAL | Status: DC

## 2022-01-19 NOTE — Patient Instructions (Addendum)
Go to the lab on the way out.   If you have mychart we'll likely use that to update you.    Take care.  Glad to see you. 

## 2022-01-19 NOTE — Progress Notes (Unsigned)
Diabetes:  Using medications without difficulties: yes Hypoglycemic episodes: cautions d/w pt.  Down to 63 once in the AM.  Usually 100 or a little lower in the AM Hyperglycemic episodes:no Feet problems:see below.   Blood Sugars averaging: ~100 in the AM eye exam within last year: yes Labs pending.   Taking glyburide 1/2 tab BID.    B12, due for a shot today.  Labs pending.  H/o neuropathy.  She is still dealing with knee and back pain at baseline.  Fatigued.  Memory d/w pt.  No red flag events.  Mood d/w pt.  Family stressors d/w pt.  She is taking ibuprofen up to 400-600mg  per dose.  She tries to limit oxycodone.  She has tolerated ibuprofen.    Oriented to year, month, day.   3/3 attention.  WORLD---->[place] 3/3 recall.    He isn't driving, she had discussion with family about that.  D/w pt.    PMH and SH reviewed  Meds, vitals, and allergies reviewed.   ROS: Per HPI unless specifically indicated in ROS section   GEN: nad, alert and oriented HEENT: ncat NECK: supple w/o LA CV: rrr. PULM: ctab, no inc wob ABD: soft, +bs EXT: no edema SKIN: no acute rash  Diabetic foot exam: Normal inspection No skin breakdown No calluses  Normal DP pulses Normal sensation to light touch and monofilament Nails normal

## 2022-01-22 NOTE — Assessment & Plan Note (Signed)
Continue glyburide.  See notes on labs. 

## 2022-01-22 NOTE — Assessment & Plan Note (Signed)
Continue oxycodone with as needed ibuprofen with routine opiate and NSAID cautions discussed with patient.

## 2022-01-22 NOTE — Assessment & Plan Note (Addendum)
See notes on labs.  Unclear how much B12 deficiency contributes to fatigue.  Discussed that it can contribute to memory changes.  She has normal brief testing today.

## 2022-01-23 ENCOUNTER — Ambulatory Visit (INDEPENDENT_AMBULATORY_CARE_PROVIDER_SITE_OTHER): Payer: Medicare PPO

## 2022-01-23 DIAGNOSIS — I639 Cerebral infarction, unspecified: Secondary | ICD-10-CM

## 2022-01-24 LAB — CUP PACEART REMOTE DEVICE CHECK
Date Time Interrogation Session: 20230906230802
Implantable Pulse Generator Implant Date: 20220407

## 2022-01-26 DIAGNOSIS — L821 Other seborrheic keratosis: Secondary | ICD-10-CM | POA: Diagnosis not present

## 2022-01-26 DIAGNOSIS — D485 Neoplasm of uncertain behavior of skin: Secondary | ICD-10-CM | POA: Diagnosis not present

## 2022-01-31 NOTE — Progress Notes (Signed)
Cancelled visit

## 2022-02-09 NOTE — Progress Notes (Signed)
Carelink Summary Report / Loop Recorder 

## 2022-02-10 ENCOUNTER — Other Ambulatory Visit: Payer: Self-pay | Admitting: Family Medicine

## 2022-02-10 MED ORDER — VITAMIN D3 50 MCG (2000 UT) PO CAPS: 2000.0000 [IU] | ORAL_CAPSULE | Freq: Every day | ORAL | Status: AC

## 2022-02-10 NOTE — Telephone Encounter (Signed)
Would change to 2000 units vitamin D daily.  She can get that over-the-counter.  Thanks.  Would stop prescription/weekly 50,000 unit vitamin D.

## 2022-02-10 NOTE — Telephone Encounter (Signed)
Looks like last labs on 01/19/22 patient was in the low normal. Do you still want her to do this rx or OTC?

## 2022-02-13 ENCOUNTER — Other Ambulatory Visit: Payer: Self-pay | Admitting: Family Medicine

## 2022-02-13 NOTE — Telephone Encounter (Signed)
Called patient and reviewed all information. Patient verbalized understanding. Will call if any further questions.  

## 2022-02-22 LAB — CUP PACEART REMOTE DEVICE CHECK
Date Time Interrogation Session: 20231009230543
Implantable Pulse Generator Implant Date: 20220407

## 2022-02-24 ENCOUNTER — Telehealth: Payer: Self-pay | Admitting: Family Medicine

## 2022-02-24 MED ORDER — OXYCODONE HCL 5 MG PO TABS: 5.0000 mg | ORAL_TABLET | Freq: Four times a day (QID) | ORAL | 0 refills | Status: DC | PRN

## 2022-02-24 NOTE — Telephone Encounter (Signed)
Refill request for oxyCODONE (OXY IR/ROXICODONE) 5 MG   LOV - 01/19/22 Next OV - 05/01/22 Last refill - 01/19/22 #30/0

## 2022-02-24 NOTE — Addendum Note (Signed)
Addended by: Tonia Ghent on: 02/24/2022 05:21 PM   Modules accepted: Orders

## 2022-02-24 NOTE — Telephone Encounter (Signed)
Sent. Thanks.   

## 2022-02-24 NOTE — Telephone Encounter (Signed)
  Encourage patient to contact the pharmacy for refills or they can request refills through [facility]  Did the patient contact the pharmacy: No  LAST APPOINTMENT DATE: 01/19/2022  NEXT APPOINTMENT DATE: 05/01/2022  MEDICATION: oxyCODONE (OXY IR/ROXICODONE) 5 MG immediate release tablet  Is the patient out of medication? Yes  PHARMACY: [place], [place]  Let patient know to contact pharmacy at the end of the day to make sure medication is ready.  Please notify patient to allow 48-72 hours to process

## 2022-02-27 ENCOUNTER — Ambulatory Visit (INDEPENDENT_AMBULATORY_CARE_PROVIDER_SITE_OTHER): Payer: Medicare PPO

## 2022-02-27 DIAGNOSIS — I639 Cerebral infarction, unspecified: Secondary | ICD-10-CM | POA: Diagnosis not present

## 2022-03-07 ENCOUNTER — Telehealth: Payer: Self-pay | Admitting: Family Medicine

## 2022-03-07 ENCOUNTER — Encounter: Payer: Self-pay | Admitting: Family Medicine

## 2022-03-07 NOTE — Telephone Encounter (Signed)
Please check with patient.  I just found out her son recently died unexpectedly.  Please check on her and give my condolences.

## 2022-03-07 NOTE — Telephone Encounter (Signed)
Called and spoke with patient briefly. She got upset when I mentioned her son which was expected. Patient said she is doing the best she can. Patient thanked [name] for calling and checking on her. I asked that she call us if she needs anything.

## 2022-03-07 NOTE — Telephone Encounter (Signed)
Noted. Thanks.

## 2022-03-14 ENCOUNTER — Other Ambulatory Visit: Payer: Self-pay | Admitting: Family Medicine

## 2022-03-14 NOTE — Telephone Encounter (Signed)
Refill request for TRAMADOL HCL 50 MG TABS 50 Tablet  LOV - 01/19/22 Next OV - 05/01/22 Last refill - 11/23/21 #60/2

## 2022-03-16 NOTE — Progress Notes (Signed)
Carelink Summary Report / Loop Recorder 

## 2022-03-20 ENCOUNTER — Telehealth: Payer: Self-pay | Admitting: Family Medicine

## 2022-03-20 NOTE — Telephone Encounter (Signed)
LOV 01/19/22 NOV 05/01/22 RF 02/24/22 #30/0

## 2022-03-20 NOTE — Telephone Encounter (Signed)
  Encourage patient to contact the pharmacy for refills or they can request refills through [facility]  Did the patient contact the pharmacy:  no   LAST APPOINTMENT DATE:  Please schedule appointment if longer than 1 year  NEXT APPOINTMENT DATE:05/01/2022  MEDICATION: oxyCODONE (OXY IR/ROXICODONE) 5 MG immediate release tablet   Is the patient out of medication? yes  If not, how much is left?  Is this a 90 day supply: no  PHARMACY: [place], [place] Phone: [phone]  Fax: [phone]      Let patient know to contact pharmacy at the end of the day to make sure medication is ready.  Please notify patient to allow 48-72 hours to process  CLINICAL FILLS OUT ALL BELOW:

## 2022-03-21 MED ORDER — OXYCODONE HCL 5 MG PO TABS: 5.0000 mg | ORAL_TABLET | Freq: Four times a day (QID) | ORAL | 0 refills | Status: DC | PRN

## 2022-03-23 ENCOUNTER — Ambulatory Visit
Admission: EM | Admit: 2022-03-23 | Discharge: 2022-03-23 | Disposition: A | Discharge status: Home / Self Care | Payer: Medicare PPO

## 2022-03-23 ENCOUNTER — Encounter: Payer: Self-pay | Admitting: Emergency Medicine

## 2022-03-23 ENCOUNTER — Telehealth: Payer: Self-pay

## 2022-03-23 ENCOUNTER — Other Ambulatory Visit: Payer: Self-pay

## 2022-03-23 DIAGNOSIS — R030 Elevated blood-pressure reading, without diagnosis of hypertension: Secondary | ICD-10-CM

## 2022-03-23 DIAGNOSIS — H1132 Conjunctival hemorrhage, left eye: Secondary | ICD-10-CM | POA: Diagnosis not present

## 2022-03-23 NOTE — Discharge Instructions (Signed)
You have a broken blood vessel in your eye which should reabsorb and heal on its own.  Please follow-up with your eye doctor if you develop significant pain, blurry vision, or if symptoms are present for more than 2 weeks.  It may take 2 weeks to heal.  Please monitor your blood pressure at home very closely with a home blood pressure cuff at least twice daily and follow-up with primary care doctor or urgent care if it is elevated.  If the top number is 180 or above or the bottom number is 100 or above, please go to the emergency department for further evaluation.  you should also go to the emergency department if you develop headache, dizziness, chest pain, shortness of breath, blurred vision, nausea, vomiting.

## 2022-03-23 NOTE — Telephone Encounter (Signed)
[name] pts daughter (DPR signed) said that on 03/22/22 pt had broken blood vessel in pts eye [name] not sure which eye) there is more redness in eye this morning. [name] took pts BP early this morning BP 173/74  P 74. Pt does not complain of H/A but when bends over or moves head has terrible pain in eye and above the eye. On 03/21/22 pt got so lightheaded that she had to sit for a while; pt felt faint but did not lose consciousness. No CP,SOB,or vision changes.Pt is diabetic also and has hx of stroke per [facility]. [name] does not think pt will go to ED. I spoke with pt today FBS 120. Pt confirmed above info about pain in eye; BP now 136/82 P 83. Pt refuses ED but will go to [facility] on [address] which is near pts home and pts husband will take pt to UC. UC & ED precautions given and pt and [name] voiced understanding. Pt or [name] will cb on 03/24/22 with update on how pt is feeling. Sending note to Dr Para March and Para March pool.

## 2022-03-23 NOTE — ED Provider Notes (Signed)
[facility]    CSN: [id number] Arrival date & time: 03/23/22  1012      History   Chief Complaint Chief Complaint  Patient presents with   Eye Pain    HPI Maria [name] is a 81 y.o. female.   Patient presents with 2 different chief complaints today.  Patient reports that she woke up yesterday with left eye redness.  She states that it does not hurt unless she moves her eye to the right.  Denies trauma, foreign body, recent falls, head injuries, drainage, blurry vision.  Patient denies that this has ever occurred before.  She does not wear glasses or contacts.  Patient takes 81 mg aspirin daily but no other blood thinning medications.  She is also concerned about her blood pressure.  She states that she took her blood pressure yesterday and it was elevated.  She cannot remember what the number was.  Although, she took it a second time and it was "normal".  She states that she is not sure why she decided to take her blood pressure as she does not take it routinely.  She denies that she has had any headache, dizziness, blurred vision, nausea, vomiting, chest pain, shortness of breath.  Patient reports that she took blood pressure medication when she was approximately 81 years old but it was discontinued pretty quickly as her blood pressure normalized.  She states that her blood pressure is typically normal.   Eye Pain    Past Medical History:  Diagnosis Date   Anemia 05/1985   Carotid artery disease ([place])    a. 02/2015 - 1-39% bilaterally.   Chronic back pain    Concussion    Coronary artery disease    a. s/p DES to [facility] 01/2015.   DDD (degenerative disc disease), cervical    DDD (degenerative disc disease), lumbar    Fracture of lower leg 07/2001   Right   GERD (gastroesophageal reflux disease)    Hyperlipidemia    Hypertension    Multinodular thyroid    Pneumonia ?1990's X 1   PSVT (paroxysmal supraventricular tachycardia)    a. s/p catheter ablation of a  parahisian atrial tachycardia which was successfully ablated from the non-coronary cusp of the aortic root 06/2014.   RBBB with left anterior fascicular block    Renal cyst 07/10/2006   bilateral   Right knee pain 02/2010   Injected - Dr. Tonita Cong   Sinus pause    a. By event monitoring 09/2014 - BB stopped at that time.   Statin intolerance    Stroke [facility])    Syncope and collapse 07/2001   "that's when I broke my leg"   Type II diabetes mellitus ([place]) 2002    Patient Active Problem List   Diagnosis Date Noted   Blister of left foot 09/20/2021   Osteoporosis 09/07/2021   Chronic pain 10/24/2020   Stroke ([place]) 08/19/2020   Hypokalemia 08/19/2020   Hyperglycemia 08/19/2020   Healthcare maintenance 08/24/2019   Advance care planning 08/24/2019   Hand pain 09/05/2018   Weakness 06/17/2018   B12 deficiency 06/16/2018   Neuropathy 06/13/2018   Neuropathic pain 05/19/2018   Edema 04/24/2018   Lumbosacral radiculopathy at L4 02/04/2018   Right sided sciatica 10/02/2017   Neck pain 01/02/2017   Bruising 09/19/2015   Dysuria 06/15/2015   Carotid artery disease ([place])    Coronary atherosclerosis of native coronary artery 01/22/2015   Coronary artery disease involving native coronary artery of native heart  without angina pectoris 01/22/2015   Chest pain 11/18/2014   Sinus node dysfunction ([facility]) 11/02/2014   SVT (supraventricular tachycardia) 07/06/2014   ETD (eustachian tube dysfunction) 05/21/2014   Benign paroxysmal positional vertigo 05/21/2014   Weight loss 06/09/2012   Functional diarrhea 02/01/2012   Type 2 diabetes mellitus with vascular disease ([place]) 02/13/2011   Shoulder pain, left 09/12/2010   ADJUSTMENT REACTION, ADULT 05/17/2010   KNEE PAIN, RIGHT 10/01/2009   SYNCOPE 02/22/2009   PSVT 11/12/2008   GERD 11/12/2008   Renal cyst 11/12/2008   Backache 11/12/2008   Multiple thyroid nodules 08/07/2007   Reflux esophagitis 08/07/2007   Hyperlipidemia 08/21/2006    ANEMIA-NOS 08/21/2006   DEPRESSION 08/21/2006   Essential hypertension 08/21/2006   DEGENERATIVE DISC DISEASE, CERVICAL SPINE 08/21/2006   DEGENERATIVE DISC DISEASE, LUMBAR SPINE 08/21/2006    Past Surgical History:  Procedure Laterality Date   ANTERIOR CERVICAL DECOMP/DISCECTOMY FUSION  10/03/2005   C4/5; C5/6  "it's got a plate in there"   ANTERIOR LAT LUMBAR FUSION Left 02/04/2018   Procedure: Lumbar two-three Lumbar three-four Lumbar four-five  Anterolateral decompression/percutaneous posterior arthrodesis, [place];  Surgeon: Kristeen Miss, MD;  Location: [place];  Service: Neurosurgery;  Laterality: Left;   APPLICATION OF ROBOTIC ASSISTANCE FOR SPINAL PROCEDURE N/A 02/04/2018   Procedure: APPLICATION OF ROBOTIC ASSISTANCE FOR SPINAL PROCEDURE;  Surgeon: Kristeen Miss, MD;  Location: [place];  Service: Neurosurgery;  Laterality: N/A;   BACK SURGERY     BREAST BIOPSY Bilateral 1968 (multiple)   "all benign"   BREAST LUMPECTOMY Bilateral 1968   CARDIAC CATHETERIZATION N/A 11/18/2014   Procedure: Left Heart Cath and Coronary Angiography;  Surgeon: Peter M Martinique, MD;  Location: [place] [facility];  Service: Cardiovascular;  Laterality: N/A;   CARDIAC CATHETERIZATION N/A 01/22/2015   Procedure: Coronary Stent Intervention;  Surgeon: Peter M Martinique, MD;  Location: [place] [facility];  Service: Cardiovascular;  Laterality: N/A;   CARDIAC CATHETERIZATION N/A 06/10/2015   Procedure: Left Heart Cath and Coronary Angiography;  Surgeon: Peter M Martinique, MD;  Location: [place] [facility];  Service: Cardiovascular;  Laterality: N/A;   CATARACT EXTRACTION W/ INTRAOCULAR LENS  IMPLANT, BILATERAL Bilateral 12/2010   CORONARY ANGIOPLASTY     ESOPHAGOGASTRODUODENOSCOPY  08/14/2005   gastropathy biopsy, negative   LAPAROSCOPIC CHOLECYSTECTOMY  08/1992   LOOP RECORDER INSERTION N/A 08/19/2020   Procedure: LOOP RECORDER INSERTION;  Surgeon: Evans Lance, MD;  Location: [place] [facility];  Service: Cardiovascular;   Laterality: N/A;   LUMBAR PERCUTANEOUS PEDICLE SCREW 3 LEVEL N/A 02/04/2018   Procedure: LUMBAR PERCUTANEOUS PEDICLE SCREW LUMBAR TWO - LUMBAR FIVE;  Surgeon: Kristeen Miss, MD;  Location: [place];  Service: Neurosurgery;  Laterality: N/A;   POSTERIOR LAMINECTOMY / DECOMPRESSION LUMBAR SPINE  09/2001   due to herniated disc   SUPRAVENTRICULAR TACHYCARDIA ABLATION N/A 07/06/2014   Procedure: SUPRAVENTRICULAR TACHYCARDIA ABLATION;  Surgeon: Evans Lance, MD;  Location: [facility] C[facility] LAB;  Service: Cardiovascular;  Laterality: N/A;   VAGINAL HYSTERECTOMY  1968   spotting    OB History   No obstetric history on file.      Home Medications    Prior to Admission medications   Medication Sig Start Date End Date Taking? Authorizing Provider  aspirin EC 81 MG tablet Take 81 mg by mouth daily. Swallow whole.    [provider]  Blood Glucose Monitoring Suppl (ACCU-CHEK AVIVA PLUS) w/Device KIT 1 kit by Does not apply route as directed. 04/27/21  Tonia Ghent, MD  Cholecalciferol (VITAMIN D3) 50 MCG (2000 UT) capsule Take 1 capsule (2,000 Units total) by mouth daily. 02/10/22   Tonia Ghent, MD  cyanocobalamin (,VITAMIN B-12,) 1000 MCG/ML injection 1000 mcg injected IM every 14 days. 10/08/18   Tonia Ghent, MD  diclofenac Sodium (VOLTAREN) 1 % GEL Apply 2 g topically 4 (four) times daily as needed. 10/22/20   Tonia Ghent, MD  glucose blood test strip Use as instructed, twice daily to check blood glucose.  Dx E11.9. 04/27/21   Tonia Ghent, MD  glyBURIDE (DIABETA) 5 MG tablet TAKE 1 TO 2 TABLETS BY MOUTH DAILY WITH BREAKFAST. 02/13/22   Tonia Ghent, MD  ibuprofen (ADVIL) 200 MG tablet Take 2-3 tablets (400-600 mg total) by mouth 2 (two) times daily as needed for moderate pain (with food). 01/19/22   Tonia Ghent, MD  loperamide (IMODIUM) 2 MG capsule Take 2 mg by mouth 4 (four) times daily as needed for diarrhea or loose stools.     [provider]  NEXLIZET  180-10 MG TABS TAKE 1 TABLET BY MOUTH DAILY. 10/26/21   Josue Hector, MD  nitroGLYCERIN (NITROSTAT) 0.4 MG SL tablet PLACE 1 TABLET UNDER THE TONGUE EVERY 5 MINUTES AS NEEDED FOR CHEST PAIN FOR UP TO 3 DOSES 11/10/20   Josue Hector, MD  omeprazole (PRILOSEC) 20 MG capsule Take 1 capsule (20 mg total) by mouth 2 (two) times daily as needed (for acid reflux/indigestion.). 04/23/18   Tonia Ghent, MD  oxyCODONE (OXY IR/ROXICODONE) 5 MG immediate release tablet Take 1 tablet (5 mg total) by mouth every 6 (six) hours as needed for moderate pain. 03/21/22   Tonia Ghent, MD  traMADol (ULTRAM) 50 MG tablet TAKE 1 TABLET BY MOUTH EVERY 6 HOURS AS NEEDED (DO NOT TAKE WTIH OXYCODONE). 03/14/22   Tonia Ghent, MD    Family History Family History  Problem Relation Age of Onset   Hypertension Mother    Heart disease Mother    Diabetes Sister    Myasthenia gravis Sister    Diabetes Brother        1/2 juvenile; foot ulcer; periph. neuropathy   Heart disease Maternal Grandfather        MI   Hypertension Maternal Grandfather    Depression Paternal Aunt    Hypertension Maternal Grandmother    Diabetes Maternal Grandmother    Stroke Maternal Grandmother    Hypertension Paternal Grandmother    Diabetes Paternal Grandmother    Hypertension Paternal Grandfather    Diabetes Paternal Grandfather    Breast cancer Maternal Aunt    Colon cancer Neg Hx     Social History Social History   Tobacco Use   Smoking status: Never   Smokeless tobacco: Never  Vaping Use   Vaping Use: Never used  Substance Use Topics   Alcohol use: No    Alcohol/week: 0.0 standard drinks of alcohol   Drug use: No     Allergies   Crestor [rosuvastatin], Ezetimibe-simvastatin, Pravastatin, Diazepam, Lyrica [pregabalin], Praluent [alirocumab], Repatha [evolocumab], Acetaminophen, Carvedilol, Gabapentin, Ibuprofen, Insulin glargine, Levemir [insulin detemir], Macrobid [nitrofurantoin macrocrystal], Metformin,  Metoprolol tartrate, and Naproxen   Review of Systems Review of Systems Per HPI  Physical Exam Triage Vital Signs ED Triage Vitals [03/23/22 1212]  Enc Vitals Group     BP (!) 166/85     Pulse Rate 76     Resp 18     Temp 98.1 F (  36.7 C)     Temp Source Oral     SpO2 96 %     Weight      Height      Head Circumference      Peak Flow      Pain Score 1     Pain Loc      Pain Edu?      Excl. in [place]?    No data found.  Updated Vital Signs BP (!) 157/76   Pulse 76   Temp 98.1 F (36.7 C) (Oral)   Resp 18   SpO2 96%   Visual Acuity Right Eye Distance:   Left Eye Distance:   Bilateral Distance:    Right Eye Near:   Left Eye Near:    Bilateral Near:     Physical Exam Constitutional:      General: She is not in acute distress.    Appearance: Normal appearance. She is not toxic-appearing or diaphoretic.  HENT:     Head: Normocephalic and atraumatic.  Eyes:     General: Lids are normal. Lids are everted, no foreign bodies appreciated. Vision grossly intact. Gaze aligned appropriately.     Extraocular Movements: Extraocular movements intact.     Conjunctiva/sclera: Conjunctivae normal.     Pupils: Pupils are equal, round, and reactive to light.      Comments: Left eye redness representing subconjunctival hemorrhage present to corner of left eye.  Extraocular movements are intact.  There is a blood clot like area present to center of hemorrhage.  Cardiovascular:     Rate and Rhythm: Normal rate and regular rhythm.     Pulses: Normal pulses.     Heart sounds: Normal heart sounds.  Pulmonary:     Effort: Pulmonary effort is normal. No respiratory distress.     Breath sounds: Normal breath sounds.  Abdominal:     General: Bowel sounds are normal. There is no distension.     Palpations: Abdomen is soft.     Tenderness: There is no abdominal tenderness.  Neurological:     General: No focal deficit present.     Mental Status: She is alert and oriented to person,  place, and time. Mental status is at baseline.     Cranial Nerves: Cranial nerves 2-12 are intact.     Sensory: Sensation is intact.     Motor: Motor function is intact.     Coordination: Coordination is intact.     Gait: Gait is intact.     Comments: Walks with walker at baseline  Psychiatric:        Mood and Affect: Mood normal.        Behavior: Behavior normal.        Thought Content: Thought content normal.        Judgment: Judgment normal.      UC Treatments / Results  Labs (all labs ordered are listed, but only abnormal results are displayed) Labs Reviewed - No data to display  EKG   Radiology No results found.  Procedures Procedures (including critical care time)  Medications Ordered in UC Medications - No data to display  Initial Impression / Assessment and Plan / UC Course  I have reviewed the triage vital signs and the nursing notes.  Pertinent labs & imaging results that were available during my care of the patient were reviewed by me and considered in my medical decision making (see chart for details).     Patient's eye redness is consistent with  subconjunctival hemorrhage.  There is a blood clot like a lesion in the center so Dr. Posey Pronto with on-call ophthalmology was called to discuss this.  He advised that there is no further treatment or work-up necessary as this should resolve with the hemorrhage.  Advised patient to follow-up with ophthalmology if symptoms persist longer than 2 weeks, eye pain becomes more significant, or if she develops blurry vision.  Educated patient that this will resolve on its own and reabsorb.  Visual acuity appears normal.  She has mildly elevated blood pressure reading.  After further review of patient's chart, it appears that her blood pressure is typically within normal range.  Patient is not symptomatic and there are no signs of hypertensive urgency or organ damage on exam.  Therefore, patient was encouraged to continue to monitor  blood pressure at home and follow-up with primary care doctor. She was advised to follow-up with urgent care if she is not able to get in with primary care doctor. she was also given strict ER precautions regarding blood pressure as well.  Patient verbalized understanding and was agreeable with plan. Final Clinical Impressions(s) / UC Diagnoses   Final diagnoses:  Subconjunctival hemorrhage of left eye  Elevated blood pressure reading     Discharge Instructions      You have a broken blood vessel in your eye which should reabsorb and heal on its own.  Please follow-up with your eye doctor if you develop significant pain, blurry vision, or if symptoms are present for more than 2 weeks.  It may take 2 weeks to heal.  Please monitor your blood pressure at home very closely with a home blood pressure cuff at least twice daily and follow-up with primary care doctor or urgent care if it is elevated.  If the top number is 180 or above or the bottom number is 100 or above, please go to the emergency department for further evaluation.  you should also go to the emergency department if you develop headache, dizziness, chest pain, shortness of breath, blurred vision, nausea, vomiting.    ED Prescriptions   None    PDMP not reviewed this encounter.   Teodora Medici, [place] 03/23/22 1316

## 2022-03-23 NOTE — ED Triage Notes (Signed)
Pt here for eye pain yesterday and noted redness to left eye today

## 2022-03-23 NOTE — Telephone Encounter (Signed)
Noted. Thanks.

## 2022-04-03 ENCOUNTER — Ambulatory Visit (INDEPENDENT_AMBULATORY_CARE_PROVIDER_SITE_OTHER): Payer: Medicare PPO

## 2022-04-03 DIAGNOSIS — I639 Cerebral infarction, unspecified: Secondary | ICD-10-CM | POA: Diagnosis not present

## 2022-04-04 LAB — CUP PACEART REMOTE DEVICE CHECK
Date Time Interrogation Session: 20231119230727
Implantable Pulse Generator Implant Date: 20220407

## 2022-04-11 ENCOUNTER — Telehealth: Payer: Self-pay | Admitting: Family Medicine

## 2022-04-11 NOTE — Telephone Encounter (Signed)
  Encourage patient to contact the pharmacy for refills or they can request refills through [facility]  LAST APPOINTMENT DATE:  Please schedule appointment if longer than 1 year  NEXT APPOINTMENT DATE:  MEDICATION:oxyCODONE (OXY IR/ROXICODONE) 5 MG immediate release tablet   Is the patient out of medication?   PHARMACY: [facility] - [place], [place]    Let patient know to contact pharmacy at the end of the day to make sure medication is ready.  Please notify patient to allow 48-72 hours to process  CLINICAL FILLS OUT ALL BELOW:   LAST REFILL:  QTY:  REFILL DATE:    OTHER COMMENTS:    Okay for refill?  Please advise

## 2022-04-11 NOTE — Telephone Encounter (Signed)
LOV - 01/19/22 NOV - 05/01/22 RF - 03/21/22 #30/0

## 2022-04-12 MED ORDER — OXYCODONE HCL 5 MG PO TABS: 5.0000 mg | ORAL_TABLET | Freq: Four times a day (QID) | ORAL | 0 refills | Status: DC | PRN

## 2022-04-12 NOTE — Telephone Encounter (Signed)
Pt called to check status of med refill? Call back # [phone]

## 2022-04-12 NOTE — Addendum Note (Signed)
Addended by: Joaquim Nam on: 04/12/2022 03:38 PM   Modules accepted: Orders

## 2022-04-12 NOTE — Telephone Encounter (Signed)
Patient notified rx was sent 

## 2022-04-12 NOTE — Telephone Encounter (Signed)
Sent. Thanks.   

## 2022-04-14 ENCOUNTER — Ambulatory Visit: Payer: Medicare PPO | Admitting: Internal Medicine

## 2022-04-14 ENCOUNTER — Encounter: Payer: Self-pay | Admitting: Internal Medicine

## 2022-04-14 VITALS — BP 136/78 | HR 98 | Temp 97.6°F | Ht 65.0 in | Wt 153.0 lb

## 2022-04-14 DIAGNOSIS — N3000 Acute cystitis without hematuria: Secondary | ICD-10-CM

## 2022-04-14 LAB — POC URINALSYSI DIPSTICK (AUTOMATED)
Bilirubin, UA: NEGATIVE
Blood, UA: NEGATIVE
Glucose, UA: NEGATIVE
Ketones, UA: NEGATIVE
Nitrite, UA: POSITIVE
Protein, UA: POSITIVE — AB
Spec Grav, UA: 1.025 (ref 1.010–1.025)
Urobilinogen, UA: 0.2 E.U./dL
pH, UA: 5.5 (ref 5.0–8.0)

## 2022-04-14 MED ORDER — SULFAMETHOXAZOLE-TRIMETHOPRIM 800-160 MG PO TABS: 1.0000 | ORAL_TABLET | Freq: Two times a day (BID) | ORAL | 1 refills | Status: DC

## 2022-04-14 NOTE — Addendum Note (Signed)
Addended by: Eual Fines on: 04/14/2022 11:52 AM   Modules accepted: Orders

## 2022-04-14 NOTE — Assessment & Plan Note (Signed)
Fairly classic symptoms Urinalysis shows positive leuks and nitrites Will defer culture Septra DS bid for 3 days

## 2022-04-14 NOTE — Progress Notes (Signed)
Subjective:    Patient ID: Maria [name], female    DOB: Sep 25, 1940, 81 y.o.   MRN: [id number]  HPI Here due to urinary symptoms  4-5 days ago--had some urinary symptoms--then it went away Uses cranberry juice twice a day chronically Burning dysuria and urgency got bad again yesterday No hematuria  Current Outpatient Medications on File Prior to Visit  Medication Sig Dispense Refill   aspirin EC 81 MG tablet Take 81 mg by mouth daily. Swallow whole.     Blood Glucose Monitoring Suppl (ACCU-CHEK AVIVA PLUS) w/Device KIT 1 kit by Does not apply route as directed. 1 kit 0   Cholecalciferol (VITAMIN D3) 50 MCG (2000 UT) capsule Take 1 capsule (2,000 Units total) by mouth daily.     cyanocobalamin (,VITAMIN B-12,) 1000 MCG/ML injection 1000 mcg injected IM every 14 days.     diclofenac Sodium (VOLTAREN) 1 % GEL Apply 2 g topically 4 (four) times daily as needed. 100 g 1   glucose blood test strip Use as instructed, twice daily to check blood glucose.  Dx E11.9. 100 each 12   glyBURIDE (DIABETA) 5 MG tablet TAKE 1 TO 2 TABLETS BY MOUTH DAILY WITH BREAKFAST. 60 tablet 1   ibuprofen (ADVIL) 200 MG tablet Take 2-3 tablets (400-600 mg total) by mouth 2 (two) times daily as needed for moderate pain (with food).     loperamide (IMODIUM) 2 MG capsule Take 2 mg by mouth 4 (four) times daily as needed for diarrhea or loose stools.      NEXLIZET 180-10 MG TABS TAKE 1 TABLET BY MOUTH DAILY. 90 tablet 3   nitroGLYCERIN (NITROSTAT) 0.4 MG SL tablet PLACE 1 TABLET UNDER THE TONGUE EVERY 5 MINUTES AS NEEDED FOR CHEST PAIN FOR UP TO 3 DOSES 25 tablet 7   omeprazole (PRILOSEC) 20 MG capsule Take 1 capsule (20 mg total) by mouth 2 (two) times daily as needed (for acid reflux/indigestion.).     oxyCODONE (OXY IR/ROXICODONE) 5 MG immediate release tablet Take 1 tablet (5 mg total) by mouth every 6 (six) hours as needed for moderate pain. 30 tablet 0   traMADol (ULTRAM) 50 MG tablet TAKE 1 TABLET BY MOUTH EVERY 6  HOURS AS NEEDED (DO NOT TAKE WTIH OXYCODONE). 60 tablet 2   No current facility-administered medications on file prior to visit.    Allergies  Allergen Reactions   Crestor [Rosuvastatin] Other (See Comments)    MYALGIAS   Ezetimibe-Simvastatin Other (See Comments)    MYALGIAS   Pravastatin Other (See Comments)    Myalgias   Diazepam     Balance difficulty.     Lyrica [Pregabalin]     swelling   Praluent [Alirocumab]     myalgias   Repatha [Evolocumab]     Hoarse voice   Acetaminophen Anxiety and Other (See Comments)    REACTION: jittery with plain tylenol, but tolerated tylenol/benadryl combination   Carvedilol Other (See Comments)    nightmares   Gabapentin Diarrhea   Ibuprofen Other (See Comments)    REACTION: GI upset at high doses   Insulin Glargine Rash    Rash   Levemir [Insulin Detemir] Rash   Macrobid [Nitrofurantoin Macrocrystal] Nausea And Vomiting and Other (See Comments)    ACHES   Metformin Nausea And Vomiting    REACTION: GI upset   Metoprolol Tartrate Other (See Comments)    REACTION: nightmares   Naproxen Nausea And Vomiting    REACTION: GI upset    Past Medical  History:  Diagnosis Date   Anemia 05/1985   Carotid artery disease ([place])    a. 02/2015 - 1-39% bilaterally.   Chronic back pain    Concussion    Coronary artery disease    a. s/p DES to [facility] 01/2015.   DDD (degenerative disc disease), cervical    DDD (degenerative disc disease), lumbar    Fracture of lower leg 07/2001   Right   GERD (gastroesophageal reflux disease)    Hyperlipidemia    Hypertension    Multinodular thyroid    Pneumonia ?1990's X 1   PSVT (paroxysmal supraventricular tachycardia)    a. s/p catheter ablation of a parahisian atrial tachycardia which was successfully ablated from the non-coronary cusp of the aortic root 06/2014.   RBBB with left anterior fascicular block    Renal cyst 07/10/2006   bilateral   Right knee pain 02/2010   Injected - Dr. Tonita Cong   Sinus  pause    a. By event monitoring 09/2014 - BB stopped at that time.   Statin intolerance    Stroke [facility])    Syncope and collapse 07/2001   "that's when I broke my leg"   Type II diabetes mellitus ([place]) 2002    Past Surgical History:  Procedure Laterality Date   ANTERIOR CERVICAL DECOMP/DISCECTOMY FUSION  10/03/2005   C4/5; C5/6  "it's got a plate in there"   ANTERIOR LAT LUMBAR FUSION Left 02/04/2018   Procedure: Lumbar two-three Lumbar three-four Lumbar four-five  Anterolateral decompression/percutaneous posterior arthrodesis, [place];  Surgeon: Kristeen Miss, MD;  Location: [place];  Service: Neurosurgery;  Laterality: Left;   APPLICATION OF ROBOTIC ASSISTANCE FOR SPINAL PROCEDURE N/A 02/04/2018   Procedure: APPLICATION OF ROBOTIC ASSISTANCE FOR SPINAL PROCEDURE;  Surgeon: Kristeen Miss, MD;  Location: [place];  Service: Neurosurgery;  Laterality: N/A;   BACK SURGERY     BREAST BIOPSY Bilateral 1968 (multiple)   "all benign"   BREAST LUMPECTOMY Bilateral 1968   CARDIAC CATHETERIZATION N/A 11/18/2014   Procedure: Left Heart Cath and Coronary Angiography;  Surgeon: Peter M Martinique, MD;  Location: [place] CV LAB;  Service: Cardiovascular;  Laterality: N/A;   CARDIAC CATHETERIZATION N/A 01/22/2015   Procedure: Coronary Stent Intervention;  Surgeon: Peter M Martinique, MD;  Location: [place] CV LAB;  Service: Cardiovascular;  Laterality: N/A;   CARDIAC CATHETERIZATION N/A 06/10/2015   Procedure: Left Heart Cath and Coronary Angiography;  Surgeon: Peter M Martinique, MD;  Location: [place] CV LAB;  Service: Cardiovascular;  Laterality: N/A;   CATARACT EXTRACTION W/ INTRAOCULAR LENS  IMPLANT, BILATERAL Bilateral 12/2010   CORONARY ANGIOPLASTY     ESOPHAGOGASTRODUODENOSCOPY  08/14/2005   gastropathy biopsy, negative   LAPAROSCOPIC CHOLECYSTECTOMY  08/1992   LOOP RECORDER INSERTION N/A 08/19/2020   Procedure: LOOP RECORDER INSERTION;  Surgeon: Evans Lance, MD;  Location: [place] CV LAB;  Service:  Cardiovascular;  Laterality: N/A;   LUMBAR PERCUTANEOUS PEDICLE SCREW 3 LEVEL N/A 02/04/2018   Procedure: LUMBAR PERCUTANEOUS PEDICLE SCREW LUMBAR TWO - LUMBAR FIVE;  Surgeon: Kristeen Miss, MD;  Location: [place];  Service: Neurosurgery;  Laterality: N/A;   POSTERIOR LAMINECTOMY / DECOMPRESSION LUMBAR SPINE  09/2001   due to herniated disc   SUPRAVENTRICULAR TACHYCARDIA ABLATION N/A 07/06/2014   Procedure: SUPRAVENTRICULAR TACHYCARDIA ABLATION;  Surgeon: Evans Lance, MD;  Location: [facility] CATH LAB;  Service: Cardiovascular;  Laterality: N/A;   VAGINAL HYSTERECTOMY  1968   spotting    Family History  Problem Relation Age of Onset  Hypertension Mother    Heart disease Mother    Diabetes Sister    Myasthenia gravis Sister    Diabetes Brother        1/2 juvenile; foot ulcer; periph. neuropathy   Heart disease Maternal Grandfather        MI   Hypertension Maternal Grandfather    Depression Paternal Aunt    Hypertension Maternal Grandmother    Diabetes Maternal Grandmother    Stroke Maternal Grandmother    Hypertension Paternal Grandmother    Diabetes Paternal Grandmother    Hypertension Paternal Grandfather    Diabetes Paternal Grandfather    Breast cancer Maternal Aunt    Colon cancer Neg Hx     Social History   Socioeconomic History   Marital status: Married    Spouse name: Not on file   Number of children: 2   Years of education: Not on file   Highest education level: Not on file  Occupational History   Occupation: Retired; Doctor, general practice, [place]    Employer: RETIRED    Comment: Works a couple days a week  Tobacco Use   Smoking status: Never   Smokeless tobacco: Never  Scientific laboratory technician Use: Never used  Substance and Sexual Activity   Alcohol use: No    Alcohol/week: 0.0 standard drinks of alcohol   Drug use: No   Sexual activity: Yes  Other Topics Concern   Not on file  Social History Narrative   Her son died July 09, 2021.        Widowed 07/09/09  after 36 years of marriage (husband had C.diff)   Remarried Jul 09, 2013   Right handed   One story home   Occasional caffeine   Social Determinants of Health   Financial Resource Strain: Low Risk  (07/26/2021)   Overall Financial Resource Strain (CARDIA)    Difficulty of Paying Living Expenses: Not hard at all  Food Insecurity: No Food Insecurity (07/26/2021)   Hunger Vital Sign    Worried About Running Out of Food in the Last Year: Never true    Ran Out of Food in the Last Year: Never true  Transportation Needs: No Transportation Needs (07/26/2021)   PRAPARE - Hydrologist (Medical): No    Lack of Transportation (Non-Medical): No  Physical Activity: Inactive (07/26/2021)   Exercise Vital Sign    Days of Exercise per Week: 0 days    Minutes of Exercise per Session: 0 min  Stress: No Stress Concern Present (07/26/2021)   [place]    Feeling of Stress : Not at all  Social Connections: Moderately Integrated (07/26/2021)   Social Connection and Isolation Panel [NHANES]    Frequency of Communication with Friends and Family: More than three times a week    Frequency of Social Gatherings with Friends and Family: More than three times a week    Attends Religious Services: More than 4 times per year    Active Member of [employer] or Organizations: No    Attends Archivist Meetings: Never    Marital Status: Married  Human resources officer Violence: Not At Risk (07/26/2021)   Humiliation, Afraid, Rape, and Kick questionnaire    Fear of Current or Ex-Partner: No    Emotionally Abused: No    Physically Abused: No    Sexually Abused: No   Review of Systems No change in chronic back pain No N/V Appetite is off some---son recently  died    Objective:   Physical Exam Constitutional:      Appearance: Normal appearance.  Abdominal:     Palpations: Abdomen is soft.     Tenderness: There is no right CVA  tenderness or left CVA tenderness.     Comments: Mild suprapubic tenderness  Neurological:     Mental Status: She is alert.            Assessment & Plan:

## 2022-05-01 ENCOUNTER — Ambulatory Visit: Payer: Medicare PPO | Admitting: Family Medicine

## 2022-05-10 ENCOUNTER — Ambulatory Visit (INDEPENDENT_AMBULATORY_CARE_PROVIDER_SITE_OTHER): Payer: Medicare PPO

## 2022-05-10 DIAGNOSIS — I639 Cerebral infarction, unspecified: Secondary | ICD-10-CM | POA: Diagnosis not present

## 2022-05-10 LAB — CUP PACEART REMOTE DEVICE CHECK
Date Time Interrogation Session: 20231226230420
Implantable Pulse Generator Implant Date: 20220407

## 2022-05-16 NOTE — Progress Notes (Signed)
Carelink Summary Report / Loop Recorder 

## 2022-05-20 ENCOUNTER — Other Ambulatory Visit: Payer: Self-pay | Admitting: Internal Medicine

## 2022-05-22 ENCOUNTER — Ambulatory Visit: Payer: Medicare PPO | Admitting: Family Medicine

## 2022-05-22 ENCOUNTER — Encounter: Payer: Self-pay | Admitting: Family Medicine

## 2022-05-22 VITALS — BP 124/70 | HR 90 | Temp 97.3°F | Ht 65.0 in | Wt 151.0 lb

## 2022-05-22 DIAGNOSIS — M549 Dorsalgia, unspecified: Secondary | ICD-10-CM | POA: Diagnosis not present

## 2022-05-22 DIAGNOSIS — E1159 Type 2 diabetes mellitus with other circulatory complications: Secondary | ICD-10-CM | POA: Diagnosis not present

## 2022-05-22 DIAGNOSIS — Z23 Encounter for immunization: Secondary | ICD-10-CM

## 2022-05-22 DIAGNOSIS — E538 Deficiency of other specified B group vitamins: Secondary | ICD-10-CM

## 2022-05-22 DIAGNOSIS — M25512 Pain in left shoulder: Secondary | ICD-10-CM | POA: Diagnosis not present

## 2022-05-22 DIAGNOSIS — K219 Gastro-esophageal reflux disease without esophagitis: Secondary | ICD-10-CM | POA: Diagnosis not present

## 2022-05-22 LAB — COMPREHENSIVE METABOLIC PANEL
ALT: 10 U/L (ref 0–35)
AST: 17 U/L (ref 0–37)
Albumin: 4.3 g/dL (ref 3.5–5.2)
Alkaline Phosphatase: 89 U/L (ref 39–117)
BUN: 17 mg/dL (ref 6–23)
CO2: 27 mEq/L (ref 19–32)
Calcium: 9.7 mg/dL (ref 8.4–10.5)
Chloride: 106 mEq/L (ref 96–112)
Creatinine, Ser: 1.01 mg/dL (ref 0.40–1.20)
GFR: 51.74 mL/min — ABNORMAL LOW (ref >60.00)
Glucose, Bld: 187 mg/dL — ABNORMAL HIGH (ref 70–99)
Potassium: 4.6 mEq/L (ref 3.5–5.1)
Sodium: 142 mEq/L (ref 135–145)
Total Bilirubin: 0.5 mg/dL (ref 0.2–1.2)
Total Protein: 6.8 g/dL (ref 6.0–8.3)

## 2022-05-22 LAB — HEMOGLOBIN A1C: Hgb A1c MFr Bld: 6.3 % (ref 4.6–6.5)

## 2022-05-22 MED ORDER — CYANOCOBALAMIN 1000 MCG/ML IJ SOLN
1000.0000 ug | Freq: Once | INTRAMUSCULAR | Status: AC
  Administered 2022-05-22: 1000 ug via INTRAMUSCULAR

## 2022-05-22 MED ORDER — OMEPRAZOLE 20 MG PO CPDR: 20.0000 mg | DELAYED_RELEASE_CAPSULE | Freq: Two times a day (BID) | ORAL | 3 refills | Status: AC | PRN

## 2022-05-22 MED ORDER — TRAMADOL HCL 50 MG PO TABS: 50.0000 mg | ORAL_TABLET | Freq: Four times a day (QID) | ORAL | 2 refills | Status: DC | PRN

## 2022-05-22 MED ORDER — OXYCODONE HCL 5 MG PO TABS: 5.0000 mg | ORAL_TABLET | Freq: Four times a day (QID) | ORAL | 0 refills | Status: DC | PRN

## 2022-05-22 NOTE — Patient Instructions (Signed)
Recheck in about 3 months.  Labs at the visit.

## 2022-05-22 NOTE — Progress Notes (Unsigned)
Diabetes:  Using medications without difficulties: yes Hypoglycemic episodes:no Hyperglycemic episodes: no Feet problems: change in sensation at baseline.   Blood Sugars averaging: ~120-170, more carbs over the xmas holiday eye exam within last year: yes  She has not used oxycodone recently.  Has used tramadol prn.  She ha previously used oxycodone prn for back pain, cautions d/w pt.  Advised not to take with tramadol.    Shoulder pain, left.  Pain with ROM, elevation and also reaching back. She has more back pain than shoulder pain at baseline.  Shoulder pain over the last 2 months.  Nsaid cautions d/w pt.    Voice slightly hoarse, has been taking NSAIDs and off PPI.  D/w pt about restart PPI and update me if not better.    Meds, vitals, and allergies reviewed.   ROS: Per HPI unless specifically indicated in ROS section   GEN: nad, alert and oriented HEENT: ncat NECK: supple w/o LA CV: rrr. PULM: ctab, no inc wob ABD: soft, +bs EXT: no edema SKIN: well perfused.   L shoulder w/o arm drop but pain on ext rotation and elevation.  Distally NV intact, grip and elbow ROM wnl.  AC joint not ttp.    Diabetic foot exam: Normal inspection No skin breakdown No calluses  Normal DP pulses Normal sensation to light touch and monofilament Nails normal  30 minutes were devoted to patient care in this encounter (this includes time spent reviewing the patient's file/history, interviewing and examining the patient, counseling/reviewing plan with patient).

## 2022-05-24 ENCOUNTER — Telehealth: Payer: Self-pay | Admitting: Family Medicine

## 2022-05-24 NOTE — Assessment & Plan Note (Signed)
Likely GERD exacerbation affecting her voice off PPI and with NSAID use.  Discussed restart PPI.

## 2022-05-24 NOTE — Assessment & Plan Note (Signed)
Continue glyburide.  See notes on labs.

## 2022-05-24 NOTE — Telephone Encounter (Signed)
Did she get her shoulder films completed?  The plan was to get plain films done and then consider physical therapy after getting that resulted.  Please let me know.  Thanks.

## 2022-05-24 NOTE — Assessment & Plan Note (Signed)
See notes on imaging.  Consider PT after checking plain films.

## 2022-05-24 NOTE — Assessment & Plan Note (Signed)
She can use oxycodone with opiate cautions.  Advised not to use with tramadol.

## 2022-05-25 NOTE — Telephone Encounter (Signed)
Yes I had talked to Maria [name] and scheduled her appt for when Maria [name] is coming in on the 15th so it would be in the system.

## 2022-05-25 NOTE — Telephone Encounter (Signed)
Noted. Thanks.

## 2022-05-25 NOTE — Telephone Encounter (Signed)
Vicki Mallet A, RT  You7 minutes ago (8:10 AM)    I called Maria [name] on 05/22/22 to make sure she was coming in for her xrays on 05/23/22.  She said she would bring in her urine sample and get her xrays done at the same time.  Maybe she did not come in due to the bad weather.  I will call her again today to see when she plans on coming back in.  ======= Noted. Thanks.

## 2022-05-29 ENCOUNTER — Other Ambulatory Visit (INDEPENDENT_AMBULATORY_CARE_PROVIDER_SITE_OTHER): Payer: Medicare PPO

## 2022-05-29 ENCOUNTER — Ambulatory Visit (INDEPENDENT_AMBULATORY_CARE_PROVIDER_SITE_OTHER)
Admission: RE | Admit: 2022-05-29 | Discharge: 2022-05-29 | Disposition: A | Discharge status: Home / Self Care | Payer: Medicare PPO | Source: Ambulatory Visit | Attending: Family Medicine | Admitting: Family Medicine

## 2022-05-29 DIAGNOSIS — M19012 Primary osteoarthritis, left shoulder: Secondary | ICD-10-CM | POA: Diagnosis not present

## 2022-05-29 DIAGNOSIS — M25512 Pain in left shoulder: Secondary | ICD-10-CM | POA: Diagnosis not present

## 2022-05-29 DIAGNOSIS — E1159 Type 2 diabetes mellitus with other circulatory complications: Secondary | ICD-10-CM

## 2022-05-29 LAB — MICROALBUMIN / CREATININE URINE RATIO
Creatinine,U: 147.3 mg/dL
Microalb Creat Ratio: 0.7 mg/g (ref 0.0–30.0)
Microalb, Ur: 1.1 mg/dL (ref 0.0–1.9)

## 2022-05-30 NOTE — Progress Notes (Signed)
Carelink Summary Report / Loop Recorder

## 2022-05-31 ENCOUNTER — Other Ambulatory Visit: Payer: Self-pay | Admitting: Family Medicine

## 2022-05-31 DIAGNOSIS — M25512 Pain in left shoulder: Secondary | ICD-10-CM

## 2022-06-12 ENCOUNTER — Ambulatory Visit: Payer: Medicare PPO | Attending: Internal Medicine

## 2022-06-12 DIAGNOSIS — I639 Cerebral infarction, unspecified: Secondary | ICD-10-CM

## 2022-06-13 LAB — CUP PACEART REMOTE DEVICE CHECK
Date Time Interrogation Session: 20240128230552
Implantable Pulse Generator Implant Date: 20220407

## 2022-06-19 ENCOUNTER — Telehealth: Payer: Self-pay

## 2022-06-19 NOTE — Progress Notes (Signed)
Care Management & Coordination Services Pharmacy Team  Reason for Encounter: Appointment Reminder  Contacted patient to confirm telephone appointment with Charlene Brooke, PharmD on 06/22/2022 at 11:00.  Unsuccessful outreach. Left voicemail for patient to return call.  Star Rating Drugs:  Medication:  Last Fill: Day Supply None noted  Care Gaps: Annual wellness visit in last year? Yes 07/26/2021  If Diabetic: Last eye exam / retinopathy screening: Up to date Last diabetic foot exam: Up to date  Charlene Brooke, PharmD notified  [name], [place] Assistant [phone]

## 2022-06-22 ENCOUNTER — Ambulatory Visit: Payer: Medicare PPO | Admitting: Pharmacist

## 2022-06-22 NOTE — Patient Instructions (Signed)
Visit Information  Phone number for Pharmacist: [phone]  Thank you for meeting with me to discuss your medications! Below is a summary of what we talked about during the visit:   Recommendations/Changes made from today's visit: -No med changes -Discussed prevention/treatment of hypoglycemia; advised vigilant monitoring of BG  Follow up plan: -Health Concierge will call patient 3 months for DM update -Pharmacist follow up televisit scheduled for 6 months -AWV 08/02/22 w/ labs   Charlene Brooke, PharmD, BCACP Clinical Pharmacist [facility] [phone]

## 2022-06-22 NOTE — Progress Notes (Signed)
Care Management & Coordination Services Pharmacy Note  06/22/2022 Name:  Maria [name] MRN:  [id number] DOB:  November 30, 1940  Summary: F/U visit -DM: A1c 6.3% (05/2022); on glyburide 2.5 mg BID; she denies low glucose < 70; of note, glyburide is not recommended with GFR < 60 due to risk for accumulation and hypoglycemia, however per chart review pt has not tolerated glipizide (GI effects); given only marginal improvement in safety and pt's multiple intolerances, reasonable to stay on glyburide for now -Pain: pt reports chronic ibuprofen use, discussed renal risks  Recommendations/Changes made from today's visit: -No med changes -Discussed prevention/treatment of hypoglycemia; advised vigilant monitoring of BG  Follow up plan: -Health Concierge will call patient 3 months for DM update -Pharmacist follow up televisit scheduled for 6 months -AWV 08/02/22 w/ labs    Subjective: Maria [name] is an 82 y.o. year old female who is a primary patient of Tonia Ghent, MD.  The care coordination team was consulted for assistance with disease management and care coordination needs.    Engaged with patient by telephone for follow up visit.  Recent office visits: 05/22/22 Dr Damita Dunnings OV: f/u - labs stable. A1c 6.3%. refilled oxycodone and tramadol for shoulder pain. Refer to PT for shoulder.  04/14/22 Dr Silvio Pate OV: acute cystitis - Rx Bactrim.  01/19/22 Dr Damita Dunnings OV: f/u - A1c 6.6%; update glyburide to 2.5 mg BID  Recent consult visits: 03/23/22 Urgent Care - L eye redness, elevated BP. Subconjunctival hemorrhage will self-resolve. Continue BP monitoring at home.  10/13/21 Dr Johnsie Cancel (Cardiology): f/u CAD - no changes.  Hospital visits: None in previous 6 months   Objective:  Lab Results  Component Value Date   CREATININE 1.01 05/22/2022   BUN 17 05/22/2022   GFR 51.74 (L) 05/22/2022   EGFR 57 (L) 08/26/2020   GFRNONAA >60 08/19/2020   GFRAA >60 10/03/2018   NA 142 05/22/2022   K 4.6  05/22/2022   CALCIUM 9.7 05/22/2022   CO2 27 05/22/2022   GLUCOSE 187 (H) 05/22/2022    Lab Results  Component Value Date/Time   HGBA1C 6.3 05/22/2022 09:24 AM   HGBA1C 6.6 (H) 01/19/2022 12:23 PM   GFR 51.74 (L) 05/22/2022 09:24 AM   GFR 53.12 (L) 01/19/2022 12:23 PM   MICROALBUR 1.1 05/29/2022 09:41 AM   MICROALBUR 0.5 02/16/2009 09:31 AM    Last diabetic Eye exam:  Lab Results  Component Value Date/Time   HMDIABEYEEXA No Retinopathy 11/22/2021 12:00 AM    Last diabetic Foot exam:  Lab Results  Component Value Date/Time   HMDIABFOOTEX yes 06/13/2010 12:00 AM     Lab Results  Component Value Date   CHOL 131 01/19/2022   HDL 41.10 01/19/2022   LDLCALC 74 01/19/2022   LDLDIRECT 168.1 02/18/2010   TRIG 82.0 01/19/2022   CHOLHDL 3 01/19/2022       Latest Ref Rng & Units 05/22/2022    9:24 AM 01/19/2022   12:23 PM 11/23/2020    8:40 AM  Hepatic Function  Total Protein 6.0 - 8.3 g/dL 6.8  6.7  6.7   Albumin 3.5 - 5.2 g/dL 4.3  4.2  4.4   AST 0 - 37 U/L 17  17  19    ALT 0 - 35 U/L 10  25  12    Alk Phosphatase 39 - 117 U/L 89  96  81   Total Bilirubin 0.2 - 1.2 mg/dL 0.5  0.6  0.7     Lab Results  Component Value  Date/Time   TSH 2.35 01/19/2022 12:23 PM   TSH 1.970 08/19/2020 06:06 AM   TSH 2.13 06/13/2018 12:22 PM   FREET4 0.8 02/16/2009 09:31 AM   FREET4 0.9 01/06/2008 09:47 AM       Latest Ref Rng & Units 01/19/2022   12:23 PM 11/23/2020    8:40 AM 08/19/2020    6:06 AM  CBC  WBC 4.0 - 10.5 K/uL 8.9  7.8  9.5   Hemoglobin 12.0 - 15.0 g/dL 10.2  72.5  36.6   Hematocrit 36.0 - 46.0 % 38.8  37.2  35.0   Platelets 150.0 - 400.0 K/uL 407.0  379.0  345     Lab Results  Component Value Date/Time   VD25OH 63.35 01/19/2022 12:23 PM   VD25OH 26.82 (L) 09/15/2021 03:58 PM   VITAMINB12 401 01/19/2022 12:23 PM   VITAMINB12 729 09/15/2021 03:58 PM    Clinical ASCVD: Yes  The ASCVD Risk score (Arnett DK, et al., 2019) failed to calculate for the following reasons:    The 2019 ASCVD risk score is only valid for ages 83 to 26   The patient has a prior MI or stroke diagnosis        07/26/2021    1:25 PM 05/03/2021   11:32 AM 04/23/2020   10:42 AM  Depression screen PHQ 2/9  Decreased Interest 0 0 0  Down, Depressed, Hopeless 0 0 0  PHQ - 2 Score 0 0 0  Altered sleeping  0   Tired, decreased energy  1   Change in appetite  0   Feeling bad or failure about yourself   0   Trouble concentrating  0   Moving slowly or fidgety/restless  0   Suicidal thoughts  0   PHQ-9 Score  1   Difficult doing work/chores  Not difficult at all      Social History   Tobacco Use  Smoking Status Never  Smokeless Tobacco Never   BP Readings from Last 3 Encounters:  05/22/22 124/70  04/14/22 136/78  03/23/22 (!) 157/76   Pulse Readings from Last 3 Encounters:  05/22/22 90  04/14/22 98  03/23/22 76   Wt Readings from Last 3 Encounters:  05/22/22 151 lb (68.5 kg)  04/14/22 153 lb (69.4 kg)  01/19/22 152 lb 8 oz (69.2 kg)   BMI Readings from Last 3 Encounters:  05/22/22 25.13 kg/m  04/14/22 25.46 kg/m  01/19/22 25.38 kg/m    Allergies  Allergen Reactions   Crestor [Rosuvastatin] Other (See Comments)    MYALGIAS   Ezetimibe-Simvastatin Other (See Comments)    MYALGIAS   Pravastatin Other (See Comments)    Myalgias   Diazepam     Balance difficulty.     Lyrica [Pregabalin]     swelling   Praluent [Alirocumab]     myalgias   Repatha [Evolocumab]     Hoarse voice   Acetaminophen Anxiety and Other (See Comments)    REACTION: jittery with plain tylenol, but tolerated tylenol/benadryl combination   Carvedilol Other (See Comments)    nightmares   Gabapentin Diarrhea   Ibuprofen Other (See Comments)    REACTION: GI upset at high doses   Insulin Glargine Rash    Rash   Levemir [Insulin Detemir] Rash   Macrobid [Nitrofurantoin Macrocrystal] Nausea And Vomiting and Other (See Comments)    ACHES   Metformin Nausea And Vomiting    REACTION: GI  upset   Metoprolol Tartrate Other (See Comments)    REACTION:  nightmares   Naproxen Nausea And Vomiting    REACTION: GI upset    Medications Reviewed Today     Reviewed by Kathyrn Sheriff, [facility] (Pharmacist) on 06/22/22 at 1425  Med List Status: <None>   Medication Order Taking? Sig Documenting Provider Last Dose Status Informant  aspirin EC 81 MG tablet [id number] Yes Take 81 mg by mouth daily. Swallow whole. [provider] Taking Active   Blood Glucose Monitoring Suppl (ACCU-CHEK AVIVA PLUS) w/Device KIT [id number] Yes 1 kit by Does not apply route as directed. Joaquim Nam, MD Taking Active   Cholecalciferol (VITAMIN D3) 50 MCG (2000 UT) capsule [id number] Yes Take 1 capsule (2,000 Units total) by mouth daily. Joaquim Nam, MD Taking Active   cyanocobalamin (,VITAMIN B-12,) 1000 MCG/ML injection [id number] Yes 1000 mcg injected IM every 14 days. Joaquim Nam, MD Taking Active Self  diclofenac Sodium (VOLTAREN) 1 % GEL [id number] Yes Apply 2 g topically 4 (four) times daily as needed. Joaquim Nam, MD Taking Active   glucose blood test strip [id number] Yes Use as instructed, twice daily to check blood glucose.  Dx E11.9. Joaquim Nam, MD Taking Active   glyBURIDE (DIABETA) 5 MG tablet [id number] Yes TAKE 1 TO 2 TABLETS BY MOUTH DAILY WITH BREAKFAST.  Patient taking differently: Take 2.5 mg by mouth 2 (two) times daily with a meal.   Joaquim Nam, MD Taking Active   ibuprofen (ADVIL) 200 MG tablet [id number] Yes Take 2-3 tablets (400-600 mg total) by mouth 2 (two) times daily as needed for moderate pain (with food). Joaquim Nam, MD Taking Active   loperamide (IMODIUM) 2 MG capsule [id number] Yes Take 2 mg by mouth 4 (four) times daily as needed for diarrhea or loose stools.  [provider] Taking Active Self  NEXLIZET 180-10 MG TABS [id number] Yes TAKE 1 TABLET BY MOUTH DAILY. Wendall Stade, MD Taking Active   nitroGLYCERIN (NITROSTAT) 0.4 MG SL  tablet [id number] Yes PLACE 1 TABLET UNDER THE TONGUE EVERY 5 MINUTES AS NEEDED FOR CHEST PAIN FOR UP TO 3 DOSES Wendall Stade, MD Taking Active   omeprazole (PRILOSEC) 20 MG capsule [id number] Yes Take 1 capsule (20 mg total) by mouth 2 (two) times daily as needed (for acid reflux/indigestion.). Joaquim Nam, MD Taking Active   oxyCODONE (OXY IR/ROXICODONE) 5 MG immediate release tablet [id number] Yes Take 1 tablet (5 mg total) by mouth every 6 (six) hours as needed (for back pain.  don't take with tramadol.  sedation caution). Joaquim Nam, MD Taking Active   traMADol Janean Sark) 50 MG tablet [id number] Yes Take 1 tablet (50 mg total) by mouth every 6 (six) hours as needed. Joaquim Nam, MD Taking Active             SDOH:  (Social Determinants of Health) assessments and interventions performed: No SDOH Interventions    Flowsheet Row Chronic Care Management from 06/21/2021 in [facility] Clinical Support from 08/14/2019 in [facility] [place]  SDOH Interventions    Housing Interventions Intervention Not Indicated --  Transportation Interventions Intervention Not Indicated --  Depression Interventions/Treatment  -- CLE7-5 Score <4 Follow-up Not Indicated  Financial Strain Interventions Intervention Not Indicated --       Medication Assistance: None required.  Patient affirms current coverage meets needs.  Medication Access: Within the past 30 days, how often has patient missed a dose of medication? 0 Is a  pillbox or other method used to improve adherence? Yes  Factors that may affect medication adherence? no barriers identified Are meds synced by current pharmacy? No  Are meds delivered by current pharmacy? No  Does patient experience delays in picking up medications due to transportation concerns? No   [facility] Services Reviewed: Is patient disadvantaged to use [facility]?: No  Current Rx insurance plan:  [employer] Name and location of Current pharmacy:  [place], [place] - [address] [place] [address] Phone: [phone] Fax: [phone]  [facility] Pharm[facility] services reviewed with patient today?: No  Patient requests to transfer care to [facility] Pharmacy?: No  Reason patient declined to change pharmacies: Loyalty to other pharmacy/Patient preference  Compliance/Adherence/Medication fill history: Care Gaps: None  Star-Rating Drugs: Glyburide - PDC 34%; pt takes 1/2 of prescribed dose   Assessment/Plan  Hyperlipidemia: (LDL goal < 70) -Controlled - LDL 74 (01/2022) -Hx CAD -Current treatment: Nexlizet 180-10 mg daily - Appropriate, Effective, Safe, Accessible Aspirin 81 mg daily - Appropriate, Effective, Safe, Accessible Nitroglycerin 0.4 mg SL prn -Medications previously tried: pravastatin, rosuvastatin, vytorin, repatha (hoarseness), praluent (myalgia) -Educated on Cholesterol goals;  -Recommended to continue current medication  Diabetes (A1c goal <7%) -Controlled - A1c 6.3% (05/2022) at goal -Current home glucose readings fasting glucose: 82 -Denies hypoglycemic/hyperglycemic symptoms -Current medications: Glyburide 5 mg - 1/2 tab BID - Appropriate, Effective, Query Safe Accu Chek Aviva Plus -Medications previously tried: metformin (n/v), levemir/lantus (rash), glipizide (GI), Januvia (pt preference d/t risk of pancreatic cancer) -Current exercise: limited mobility -Educated on A1c and blood sugar goals;Prevention and management of hypoglycemic episodes; -Reviewed risks of using glyburide with GFR < 60 due to accumulation of active metabolites, pt is at higher risk for hypoglycemia; considered switching to glipizide or glimepiride, but pt did not tolerate glipizide previously and given only marginal improvement in safety and pt's multiple intolerances, reasonable to stay on glyburide for now -Recommended to continue current  medication  Chronic Kidney Disease Stage 3a  -All medications assessed for renal dosing and appropriateness in chronic kidney disease. -Recommended to continue current medication  GERD (Goal: Improve acid-reflux symptoms) -Controlled - per report, PPI rare use, 1-2 times per month -Current treatment  Omeprazole 20 mg BID PRN - Appropriate, Effective, Safe, Accessible -Medications previously tried: none   -Recommended to continue current medication  Pain (Goal: manage pain) -Stable - pt reports pain is bothersome but she decided not to pursue PT and does not want any more surgeries; -Hx shoulder pain, referred to PT recently 05/2022 -Current treatment  Oxycodone IR 5 mg #30/month - Appropriate, Effective, Safe, Accessible Tramadol 50 mg -Appropriate, Effective, Safe, Accessible Voltaren gel -Appropriate, Effective, Safe, Accessible Ibuprofen 200 mg PRN- Appropriate, Effective, Query Safe -Medications previously tried: lyrica (swelling), gabapentin (diarrhea), tylenol (jittery)  -Reviewed renal risks with NSAID use, advised to limit ibuprofen as much as able -Recommended to continue current medication  Hypertension (BP goal <140/90) -Controlled, per clinic readings -Current treatment: None  -Medications previously tried: losartan (d/c due to concern for over-treatment), carvedilol, metoprolol  -Current home readings: Spouse has checked at home recently and reports BP was very good. She does not recall range.  -Denies hypotensive/hypertensive symptoms -Educated on BP goals and benefits of medications for prevention of heart attack, stroke and kidney damage; -Counseled to monitor BP at home 1-2 times monthly, document, and provide log at future appointments -Recommended continue monitoring at home on occasion. Let us know if BP consistently >  140/90.  Health Maintenance -Vaccine gaps: Shingrix, covid booster -Gets B12 injections -OTC: Vitamin D, imodium    Charlene Brooke,  PharmD, BCACP Clinical Pharmacist [facility] [phone]

## 2022-06-22 NOTE — Telephone Encounter (Signed)
Patient returned call regarding her phone call with Mendel Ryder today at 1100am. Would like a cb.

## 2022-06-22 NOTE — Telephone Encounter (Signed)
Spoke with patient. See care coord encounter

## 2022-06-22 NOTE — Telephone Encounter (Signed)
Care Management & Coordination Services Outreach Note  06/22/2022 Name: Maria [name] MRN: [id number] DOB: 03-12-1941  Referred by: Tonia Ghent, MD  Patient had a phone appointment scheduled with clinical pharmacist today.  An unsuccessful telephone outreach was attempted today. The patient was referred to the pharmacist for assistance with medications, care management and care coordination.   Patient will NOT be penalized in any way for missing a Care Management & Coordination Services appointment. The no-show fee does not apply.  If possible, a message was left to return call to: [phone] or to [facility].  Charlene Brooke, PharmD, BCACP Clinical Pharmacist [facility] [facility] [phone]

## 2022-07-10 ENCOUNTER — Ambulatory Visit: Payer: Medicare PPO | Admitting: Physical Therapy

## 2022-07-13 ENCOUNTER — Telehealth: Payer: Self-pay | Admitting: Family Medicine

## 2022-07-13 NOTE — Telephone Encounter (Signed)
Prescription Request  07/13/2022   LOV: 05/22/2022  What is the name of the medication or equipment?  oxyCODONE (OXY IR/ROXICODONE) 5 MG immediate release tablet     Have you contacted your pharmacy to request a refill? No   Which pharmacy would you like this sent to?  [place], [address] [place] [address] Phone: [phone] Fax: [phone]    Patient notified that their request is being sent to the clinical staff for review and that they should receive a response within 2 business days.   Please advise at Mobile [phone] (mobile)

## 2022-07-13 NOTE — Telephone Encounter (Signed)
LOV - 05/22/22 NOV - not scheduled RF - 05/22/22 #30/0

## 2022-07-14 MED ORDER — OXYCODONE HCL 5 MG PO TABS: 5.0000 mg | ORAL_TABLET | Freq: Four times a day (QID) | ORAL | 0 refills | Status: DC | PRN

## 2022-07-14 NOTE — Telephone Encounter (Signed)
Sent. Please shred the hard copy.  Thanks.

## 2022-07-14 NOTE — Addendum Note (Signed)
Addended by: Tonia Ghent on: 07/14/2022 08:01 AM   Modules accepted: Orders

## 2022-07-14 NOTE — Telephone Encounter (Signed)
[facility] was not affected in the outage. I called them and they stated they have been getting erxs just fine since everyone else lost theirs including controlled rxs. Rx can be sent via erx.

## 2022-07-14 NOTE — Telephone Encounter (Signed)
Printed.  Thanks.  

## 2022-07-14 NOTE — Addendum Note (Signed)
Addended by: Tonia Ghent on: 07/14/2022 01:56 PM   Modules accepted: Orders

## 2022-07-17 ENCOUNTER — Ambulatory Visit (INDEPENDENT_AMBULATORY_CARE_PROVIDER_SITE_OTHER): Payer: Medicare PPO

## 2022-07-17 DIAGNOSIS — I639 Cerebral infarction, unspecified: Secondary | ICD-10-CM | POA: Diagnosis not present

## 2022-07-17 LAB — CUP PACEART REMOTE DEVICE CHECK
Date Time Interrogation Session: 20240303230521
Implantable Pulse Generator Implant Date: 20220407

## 2022-07-24 ENCOUNTER — Telehealth: Payer: Self-pay

## 2022-07-24 NOTE — Telephone Encounter (Signed)
-----   Message from Tonia Ghent, MD sent at 07/22/2022 10:04 PM EST ----- Regarding: FW: Wed 3/20 lab She needs f/u OV with me, not a lab visit.  We can do labs at the [facility].  Thanks.   Brigitte Pulse  ----- Message ----- From: Velna Hatchet, RT Sent: 07/17/2022   2:13 PM EST To: Tonia Ghent, MD Subject: Wed 3/20 lab                                   Patient has AWV visit with labs only appt prior to that visit on 08/02/22.  Future lab orders needed, please.  Thanks, Anda Kraft

## 2022-07-24 NOTE — Telephone Encounter (Signed)
Patient will call back and schedule tomorrow (07/25/2022)

## 2022-07-24 NOTE — Telephone Encounter (Signed)
Patient needs lab appt cancelled per Dr. Damita Dunnings and come in for her AWV part 2 with him. He will do labs at her visit. Please call to get her scheduled.

## 2022-07-25 NOTE — Progress Notes (Signed)
Carelink Summary Report / Loop Recorder 

## 2022-07-31 ENCOUNTER — Other Ambulatory Visit: Payer: Self-pay | Admitting: Family Medicine

## 2022-07-31 NOTE — Telephone Encounter (Signed)
Refill request for TRAMADOL HCL 50 MG TABS 50 Tablet   LOV - 05/22/22 Next OV - not scheduled Last refill - 05/22/22 #60/2  Also needs glyburide 5 mg tabs refilled

## 2022-08-01 NOTE — Telephone Encounter (Signed)
Tramadol sent.  Please verify current glyburide use/dose and let me know.  Thanks.

## 2022-08-02 ENCOUNTER — Ambulatory Visit (INDEPENDENT_AMBULATORY_CARE_PROVIDER_SITE_OTHER): Payer: Medicare PPO

## 2022-08-02 ENCOUNTER — Other Ambulatory Visit: Payer: Self-pay | Admitting: Family Medicine

## 2022-08-02 ENCOUNTER — Other Ambulatory Visit: Payer: Medicare PPO

## 2022-08-02 VITALS — Ht 65.0 in | Wt 151.0 lb

## 2022-08-02 DIAGNOSIS — Z0000 Encounter for general adult medical examination without abnormal findings: Secondary | ICD-10-CM | POA: Diagnosis not present

## 2022-08-02 DIAGNOSIS — E538 Deficiency of other specified B group vitamins: Secondary | ICD-10-CM

## 2022-08-02 DIAGNOSIS — E1159 Type 2 diabetes mellitus with other circulatory complications: Secondary | ICD-10-CM

## 2022-08-02 NOTE — Telephone Encounter (Signed)
LMTCB

## 2022-08-02 NOTE — Patient Instructions (Addendum)
Maria [name] , Thank you for taking time to come for your Medicare Wellness Visit. I appreciate your ongoing commitment to your health goals. Please review the following plan we discussed and let me know if I can assist you in the future.   These are the goals we discussed:  Goals      Patient Stated     08/14/2019, I will maintain and continue medications as prescribed.      Patient Stated     Would like to learn to deal with neuropathy      Patient Stated     No new goals.     Reduce carbs, increase protein     Starting 07/05/2016, I will monitor intake of simple carbohydrates and increase intake of healthy proteins in an effort to lose 10 lbs.         This is a list of the screening recommended for you and due dates:  Health Maintenance  Topic Date Due   Zoster (Shingles) Vaccine (1 of 2) Never done   COVID-19 Vaccine (4 - 2023-24 season) 01/13/2022   Colon Cancer Screening  08/14/2023*   Hemoglobin A1C  11/20/2022   Eye exam for diabetics  11/23/2022   Yearly kidney function blood test for diabetes  05/23/2023   Complete foot exam   05/23/2023   Yearly kidney health urinalysis for diabetes  05/30/2023   Medicare Annual Wellness Visit  08/02/2023   DTaP/Tdap/Td vaccine (4 - Td or Tdap) 02/23/2027   Pneumonia Vaccine  Completed   Flu Shot  Completed   DEXA scan (bone density measurement)  Completed   HPV Vaccine  Aged Out  *Topic was postponed. The date shown is not the original due date.   Opioid Pain Medicine Management Opioids are powerful medicines that are used to treat moderate to severe pain. When used for short periods of time, they can help you to: Sleep better. Do better in physical or occupational therapy. Feel better in the first few days after an injury. Recover from surgery. Opioids should be taken with the supervision of a trained health care provider. They should be taken for the shortest period of time possible. This is because opioids can be addictive, and  the longer you take opioids, the greater your risk of addiction. This addiction can also be called opioid use disorder. What are the risks? Using opioid pain medicines for longer than 3 days increases your risk of side effects. Side effects include: Constipation. Nausea and vomiting. Breathing difficulties (respiratory depression). Drowsiness. Confusion. Opioid use disorder. Itching. Taking opioid pain medicine for a long period of time can affect your ability to do daily tasks. It also puts you at risk for: Motor vehicle crashes. Depression. Suicide. Heart attack. Overdose, which can be life-threatening. What is a pain treatment plan? A pain treatment plan is an agreement between you and your health care provider. Pain is unique to each person, and treatments vary depending on your condition. To manage your pain, you and your health care provider need to work together. To help you do this: Discuss the goals of your treatment, including how much pain you might expect to have and how you will manage the pain. Review the risks and benefits of taking opioid medicines. Remember that a good treatment plan uses more than one approach and minimizes the chance of side effects. Be honest about the amount of medicines you take and about any drug or alcohol use. Get pain medicine prescriptions from only one health  care provider. Pain can be managed with many types of alternative treatments. Ask your health care provider to refer you to one or more specialists who can help you manage pain through: Physical or occupational therapy. Counseling (cognitive behavioral therapy). Good nutrition. Biofeedback. Massage. Meditation. Non-opioid medicine. Following a gentle exercise program. How to use opioid pain medicine Taking medicine Take your pain medicine exactly as told by your health care provider. Take it only when you need it. If your pain gets less severe, you may take less than your prescribed  dose if your health care provider approves. If you are not having pain, do nottake pain medicine unless your health care provider tells you to take it. If your pain is severe, do nottry to treat it yourself by taking more pills than instructed on your prescription. Contact your health care provider for help. Write down the times when you take your pain medicine. It is easy to become confused while on pain medicine. Writing the time can help you avoid overdose. Take other over-the-counter or prescription medicines only as told by your health care provider. Keeping yourself and others safe  While you are taking opioid pain medicine: Do not drive, use machinery, or power tools. Do not sign legal documents. Do not drink alcohol. Do not take sleeping pills. Do not supervise children by yourself. Do not do activities that require climbing or being in high places. Do not go to a lake, river, ocean, spa, or swimming pool. Do not share your pain medicine with anyone. Keep pain medicine in a locked cabinet or in a secure area where pets and children cannot reach it. Stopping your use of opioids If you have been taking opioid medicine for more than a few weeks, you may need to slowly decrease (taper) how much you take until you stop completely. Tapering your use of opioids can decrease your risk of symptoms of withdrawal, such as: Pain and cramping in the abdomen. Nausea. Sweating. Sleepiness. Restlessness. Uncontrollable shaking (tremors). Cravings for the medicine. Do not attempt to taper your use of opioids on your own. Talk with your health care provider about how to do this. Your health care provider may prescribe a step-down schedule based on how much medicine you are taking and how long you have been taking it. Getting rid of leftover pills Do not save any leftover pills. Get rid of leftover pills safely by: Taking the medicine to a prescription take-back program. This is usually offered by  the county or law enforcement. Bringing them to a pharmacy that has a drug disposal container. Flushing them down the toilet. Check the label or package insert of your medicine to see whether this is safe to do. Throwing them out in the trash. Check the label or package insert of your medicine to see whether this is safe to do. If it is safe to throw it out, remove the medicine from the original container, put it into a sealable bag or container, and mix it with used coffee grounds, food scraps, dirt, or cat litter before putting it in the trash. Follow these instructions at home: Activity Do exercises as told by your health care provider. Avoid activities that make your pain worse. Return to your normal activities as told by your health care provider. Ask your health care provider what activities are safe for you. General instructions You may need to take these actions to prevent or treat constipation: Drink enough fluid to keep your urine pale yellow. Take over-the-counter or  prescription medicines. Eat foods that are high in fiber, such as beans, whole grains, and fresh fruits and vegetables. Limit foods that are high in fat and processed sugars, such as fried or sweet foods. Keep all follow-up visits. This is important. Where to find support If you have been taking opioids for a long time, you may benefit from receiving support for quitting from a local support group or counselor. Ask your health care provider for a referral to these resources in your area. Where to find more information Centers for Disease Control and Prevention (CDC): http://www.wolf.info/ [place] Food and Drug Administration (FDA): [place] Get help right away if: You may have taken too much of an opioid (overdosed). Common symptoms of an overdose: Your breathing is slower or more shallow than normal. You have a very slow heartbeat (pulse). You have slurred speech. You have nausea and vomiting. Your pupils become very  small. You have other potential symptoms: You are very confused. You faint or feel like you will faint. You have cold, clammy skin. You have blue lips or fingernails. You have thoughts of harming yourself or harming others. These symptoms may represent a serious problem that is an emergency. Do not wait to see if the symptoms will go away. Get medical help right away. Call your local emergency services (911 in the [place] Do not drive yourself to the hospital.  If you ever feel like you may hurt yourself or others, or have thoughts about taking your own life, get help right away. Go to your nearest emergency department or: Call your local emergency services (911 in the [place] Call the [facility] 518-240-2261 in the [place] Call a suicide crisis helpline, such as the [place] at 217-758-4539 or 988 in the [place]. This is open 24 hours a day in the [place] Text the Crisis Text Line at 608-611-4035 (in the [place].). Summary Opioid medicines can help you manage moderate to severe pain for a short period of time. A pain treatment plan is an agreement between you and your health care provider. Discuss the goals of your treatment, including how much pain you might expect to have and how you will manage the pain. If you think that you or someone else may have taken too much of an opioid, get medical help right away. This information is not intended to replace advice given to you by your health care provider. Make sure you discuss any questions you have with your health care provider. Document Revised: 11/24/2020 Document Reviewed: 08/11/2020 Elsevier Patient Education  [place] directives: Please bring a copy of your health care power of attorney and living will to the office to be added to your chart at your convenience.   Conditions/risks identified: Aim for 30 minutes of exercise or brisk walking, 6-8 glasses of water, and 5 servings of  fruits and vegetables each day.   Next appointment: Follow up in one year for your annual wellness visit 08/06/23 @ 2:30 telephone visit   Preventive Care 65 Years and Older, Female Preventive care refers to lifestyle choices and visits with your health care provider that can promote health and wellness. What does preventive care include? A yearly physical exam. This is also called an annual well check. Dental exams once or twice a year. Routine eye exams. Ask your health care provider how often you should have your eyes checked. Personal lifestyle choices, including: Daily care of your teeth and gums. Regular physical activity. Eating  a healthy diet. Avoiding tobacco and drug use. Limiting alcohol use. Practicing safe sex. Taking low-dose aspirin every day. Taking vitamin and mineral supplements as recommended by your health care provider. What happens during an annual well check? The services and screenings done by your health care provider during your annual well check will depend on your age, overall health, lifestyle risk factors, and family history of disease. Counseling  Your health care provider may ask you questions about your: Alcohol use. Tobacco use. Drug use. Emotional well-being. Home and relationship well-being. Sexual activity. Eating habits. History of falls. Memory and ability to understand (cognition). Work and work Statistician. Reproductive health. Screening  You may have the following tests or measurements: Height, weight, and BMI. Blood pressure. Lipid and cholesterol levels. These may be checked every 5 years, or more frequently if you are over 1 years old. Skin check. Lung cancer screening. You may have this screening every year starting at age 43 if you have a 30-pack-year history of smoking and currently smoke or have quit within the past 15 years. Fecal occult blood test (FOBT) of the stool. You may have this test every year starting at age  1. Flexible sigmoidoscopy or colonoscopy. You may have a sigmoidoscopy every 5 years or a colonoscopy every 10 years starting at age 20. Hepatitis C blood test. Hepatitis B blood test. Sexually transmitted disease (STD) testing. Diabetes screening. This is done by checking your blood sugar (glucose) after you have not eaten for a while (fasting). You may have this done every 1-3 years. Bone density scan. This is done to screen for osteoporosis. You may have this done starting at age 25. Mammogram. This may be done every 1-2 years. Talk to your health care provider about how often you should have regular mammograms. Talk with your health care provider about your test results, treatment options, and if necessary, the need for more tests. Vaccines  Your health care provider may recommend certain vaccines, such as: Influenza vaccine. This is recommended every year. Tetanus, diphtheria, and acellular pertussis (Tdap, Td) vaccine. You may need a Td booster every 10 years. Zoster vaccine. You may need this after age 76. Pneumococcal 13-valent conjugate (PCV13) vaccine. One dose is recommended after age 64. Pneumococcal polysaccharide (PPSV23) vaccine. One dose is recommended after age 31. Talk to your health care provider about which screenings and vaccines you need and how often you need them. This information is not intended to replace advice given to you by your health care provider. Make sure you discuss any questions you have with your health care provider. Document Released: 05/28/2015 Document Revised: 01/19/2016 Document Reviewed: 03/02/2015 Elsevier Interactive Patient Education  2017 [place] Prevention in the Home Falls can cause injuries. They can happen to people of all ages. There are many things you can do to make your home safe and to help prevent falls. What can I do on the outside of my home? Regularly fix the edges of walkways and driveways and fix any cracks. Remove  anything that might make you trip as you walk through a door, such as a raised step or threshold. Trim any bushes or trees on the path to your home. Use bright outdoor lighting. Clear any walking paths of anything that might make someone trip, such as rocks or tools. Regularly check to see if handrails are loose or broken. Make sure that both sides of any steps have handrails. Any raised decks and porches should have guardrails on the edges.  Have any leaves, snow, or ice cleared regularly. Use sand or salt on walking paths during winter. Clean up any spills in your garage right away. This includes oil or grease spills. What can I do in the bathroom? Use night lights. Install grab bars by the toilet and in the tub and shower. Do not use towel bars as grab bars. Use non-skid mats or decals in the tub or shower. If you need to sit down in the shower, use a plastic, non-slip stool. Keep the floor dry. Clean up any water that spills on the floor as soon as it happens. Remove soap buildup in the tub or shower regularly. Attach bath mats securely with double-sided non-slip rug tape. Do not have throw rugs and other things on the floor that can make you trip. What can I do in the bedroom? Use night lights. Make sure that you have a light by your bed that is easy to reach. Do not use any sheets or blankets that are too big for your bed. They should not hang down onto the floor. Have a firm chair that has side arms. You can use this for support while you get dressed. Do not have throw rugs and other things on the floor that can make you trip. What can I do in the kitchen? Clean up any spills right away. Avoid walking on wet floors. Keep items that you use a lot in easy-to-reach places. If you need to reach something above you, use a strong step stool that has a grab bar. Keep electrical cords out of the way. Do not use floor polish or wax that makes floors slippery. If you must use wax, use  non-skid floor wax. Do not have throw rugs and other things on the floor that can make you trip. What can I do with my stairs? Do not leave any items on the stairs. Make sure that there are handrails on both sides of the stairs and use them. Fix handrails that are broken or loose. Make sure that handrails are as long as the stairways. Check any carpeting to make sure that it is firmly attached to the stairs. Fix any carpet that is loose or worn. Avoid having throw rugs at the top or bottom of the stairs. If you do have throw rugs, attach them to the floor with carpet tape. Make sure that you have a light switch at the top of the stairs and the bottom of the stairs. If you do not have them, ask someone to add them for you. What else can I do to help prevent falls? Wear shoes that: Do not have high heels. Have rubber bottoms. Are comfortable and fit you well. Are closed at the toe. Do not wear sandals. If you use a stepladder: Make sure that it is fully opened. Do not climb a closed stepladder. Make sure that both sides of the stepladder are locked into place. Ask someone to hold it for you, if possible. Clearly mark and make sure that you can see: Any grab bars or handrails. First and last steps. Where the edge of each step is. Use tools that help you move around (mobility aids) if they are needed. These include: Canes. Walkers. Scooters. Crutches. Turn on the lights when you go into a [place] area. Replace any light bulbs as soon as they burn out. Set up your furniture so you have a clear path. Avoid moving your furniture around. If any of your floors are uneven, fix them.  If there are any pets around you, be aware of where they are. Review your medicines with your doctor. Some medicines can make you feel dizzy. This can increase your chance of falling. Ask your doctor what other things that you can do to help prevent falls. This information is not intended to replace advice given to  you by your health care provider. Make sure you discuss any questions you have with your health care provider. Document Released: 02/25/2009 Document Revised: 10/07/2015 Document Reviewed: 06/05/2014 Elsevier Interactive Patient Education  2017 [employer].

## 2022-08-02 NOTE — Progress Notes (Signed)
Subjective:   Maria [name] is a 82 y.o. female who presents for Medicare Annual (Subsequent) preventive examination.  Review of Systems      Cardiac Risk Factors include: advanced age (>23men, >18 women);hypertension;diabetes mellitus     Objective:    Today's Vitals   08/02/22 1028  Weight: 151 lb (68.5 kg)  Height: 5\' 5"  (1.651 m)   Body mass index is 25.13 kg/m.     08/02/2022   10:37 AM 07/26/2021    1:19 PM 08/19/2020   12:07 PM 08/18/2020    8:32 PM 01/02/2020   12:49 PM 08/14/2019    3:16 PM 06/17/2018   11:13 AM  Advanced Directives  Does Patient Have a Medical Advance Directive? Yes Yes Yes Yes Yes Yes No  Type of Paramedic of [place];Living will [place];Living will Living will Living will  [place];Living will   Does patient want to make changes to medical advance directive?  Yes (MAU/Ambulatory/Procedural Areas - Information given) Yes (Inpatient - patient defers changing a medical advance directive at this time - Information given)      Copy of [place] in Chart? No - copy requested     No - copy requested   Would patient like information on creating a medical advance directive?   No - Patient declined    No - Patient declined    Current Medications (verified) Outpatient Encounter Medications as of 08/02/2022  Medication Sig   aspirin EC 81 MG tablet Take 81 mg by mouth daily. Swallow whole.   Blood Glucose Monitoring Suppl (ACCU-CHEK AVIVA PLUS) w/Device KIT 1 kit by Does not apply route as directed.   Cholecalciferol (VITAMIN D3) 50 MCG (2000 UT) capsule Take 1 capsule (2,000 Units total) by mouth daily.   diclofenac Sodium (VOLTAREN) 1 % GEL Apply 2 g topically 4 (four) times daily as needed.   glucose blood test strip Use as instructed, twice daily to check blood glucose.  Dx E11.9.   glyBURIDE (DIABETA) 5 MG tablet TAKE 1 TO 2 TABLETS BY MOUTH DAILY WITH BREAKFAST. (Patient  taking differently: Take 2.5 mg by mouth 2 (two) times daily with a meal.)   ibuprofen (ADVIL) 200 MG tablet Take 2-3 tablets (400-600 mg total) by mouth 2 (two) times daily as needed for moderate pain (with food).   loperamide (IMODIUM) 2 MG capsule Take 2 mg by mouth 4 (four) times daily as needed for diarrhea or loose stools.    NEXLIZET 180-10 MG TABS TAKE 1 TABLET BY MOUTH DAILY.   nitroGLYCERIN (NITROSTAT) 0.4 MG SL tablet PLACE 1 TABLET UNDER THE TONGUE EVERY 5 MINUTES AS NEEDED FOR CHEST PAIN FOR UP TO 3 DOSES   omeprazole (PRILOSEC) 20 MG capsule Take 1 capsule (20 mg total) by mouth 2 (two) times daily as needed (for acid reflux/indigestion.).   traMADol (ULTRAM) 50 MG tablet TAKE 1 TABLET BY MOUTH EVERY 6 HOURS AS NEEDED (DO NOT TAKE WTIH OXYCODONE).   cyanocobalamin (,VITAMIN B-12,) 1000 MCG/ML injection 1000 mcg injected IM every 14 days. (Patient not taking: Reported on 08/02/2022)   oxyCODONE (OXY IR/ROXICODONE) 5 MG immediate release tablet Take 1 tablet (5 mg total) by mouth every 6 (six) hours as needed (for back pain.  don't take with tramadol.  sedation caution). (Patient not taking: Reported on 08/02/2022)   No facility-administered encounter medications on file as of 08/02/2022.    Allergies (verified) Crestor [rosuvastatin], Ezetimibe-simvastatin, Pravastatin, Diazepam, Lyrica [  pregabalin], Praluent [alirocumab], Repatha [evolocumab], Acetaminophen, Carvedilol, Gabapentin, Ibuprofen, Insulin glargine, Levemir [insulin detemir], Macrobid [nitrofurantoin macrocrystal], Metformin, Metoprolol tartrate, and Naproxen   History: Past Medical History:  Diagnosis Date   Anemia 05/1985   Carotid artery disease ([place])    a. 02/2015 - 1-39% bilaterally.   Chronic back pain    Concussion    Coronary artery disease    a. s/p DES to [facility] 01/2015.   DDD (degenerative disc disease), cervical    DDD (degenerative disc disease), lumbar    Fracture of lower leg 07/2001   Right   GERD  (gastroesophageal reflux disease)    Hyperlipidemia    Hypertension    Multinodular thyroid    Pneumonia ?1990's X 1   PSVT (paroxysmal supraventricular tachycardia)    a. s/p catheter ablation of a parahisian atrial tachycardia which was successfully ablated from the non-coronary cusp of the aortic root 06/2014.   RBBB with left anterior fascicular block    Renal cyst 07/10/2006   bilateral   Right knee pain 02/2010   Injected - Dr. Tonita Cong   Sinus pause    a. By event monitoring 09/2014 - BB stopped at that time.   Statin intolerance    Stroke [facility])    Syncope and collapse 07/2001   "that's when I broke my leg"   Type II diabetes mellitus ([place]) 2002   Past Surgical History:  Procedure Laterality Date   ANTERIOR CERVICAL DECOMP/DISCECTOMY FUSION  10/03/2005   C4/5; C5/6  "it's got a plate in there"   ANTERIOR LAT LUMBAR FUSION Left 02/04/2018   Procedure: Lumbar two-three Lumbar three-four Lumbar four-five  Anterolateral decompression/percutaneous posterior arthrodesis, [place];  Surgeon: Kristeen Miss, MD;  Location: [place];  Service: Neurosurgery;  Laterality: Left;   APPLICATION OF ROBOTIC ASSISTANCE FOR SPINAL PROCEDURE N/A 02/04/2018   Procedure: APPLICATION OF ROBOTIC ASSISTANCE FOR SPINAL PROCEDURE;  Surgeon: Kristeen Miss, MD;  Location: [place];  Service: Neurosurgery;  Laterality: N/A;   BACK SURGERY     BREAST BIOPSY Bilateral 1968 (multiple)   "all benign"   BREAST LUMPECTOMY Bilateral 1968   CARDIAC CATHETERIZATION N/A 11/18/2014   Procedure: Left Heart Cath and Coronary Angiography;  Surgeon: Peter M Martinique, MD;  Location: [place] CV LAB;  Service: Cardiovascular;  Laterality: N/A;   CARDIAC CATHETERIZATION N/A 01/22/2015   Procedure: Coronary Stent Intervention;  Surgeon: Peter M Martinique, MD;  Location: [place] CV LAB;  Service: Cardiovascular;  Laterality: N/A;   CARDIAC CATHETERIZATION N/A 06/10/2015   Procedure: Left Heart Cath and Coronary Angiography;  Surgeon: Peter M  Martinique, MD;  Location: [place] CV LAB;  Service: Cardiovascular;  Laterality: N/A;   CATARACT EXTRACTION W/ INTRAOCULAR LENS  IMPLANT, BILATERAL Bilateral 12/2010   CORONARY ANGIOPLASTY     ESOPHAGOGASTRODUODENOSCOPY  08/14/2005   gastropathy biopsy, negative   LAPAROSCOPIC CHOLECYSTECTOMY  08/1992   LOOP RECORDER INSERTION N/A 08/19/2020   Procedure: LOOP RECORDER INSERTION;  Surgeon: Evans Lance, MD;  Location: [place] CV LAB;  Service: Cardiovascular;  Laterality: N/A;   LUMBAR PERCUTANEOUS PEDICLE SCREW 3 LEVEL N/A 02/04/2018   Procedure: LUMBAR PERCUTANEOUS PEDICLE SCREW LUMBAR TWO - LUMBAR FIVE;  Surgeon: Kristeen Miss, MD;  Location: [place];  Service: Neurosurgery;  Laterality: N/A;   POSTERIOR LAMINECTOMY / DECOMPRESSION LUMBAR SPINE  09/2001   due to herniated disc   SUPRAVENTRICULAR TACHYCARDIA ABLATION N/A 07/06/2014   Procedure: SUPRAVENTRICULAR TACHYCARDIA ABLATION;  Surgeon: Evans Lance, MD;  Location: [facility] CATH LAB;  Service: Cardiovascular;  Laterality: N/A;   VAGINAL HYSTERECTOMY  1968   spotting   Family History  Problem Relation Age of Onset   Hypertension Mother    Heart disease Mother    Diabetes Sister    Myasthenia gravis Sister    Diabetes Brother        1/2 juvenile; foot ulcer; periph. neuropathy   Heart disease Maternal Grandfather        MI   Hypertension Maternal Grandfather    Depression Paternal Aunt    Hypertension Maternal Grandmother    Diabetes Maternal Grandmother    Stroke Maternal Grandmother    Hypertension Paternal Grandmother    Diabetes Paternal Grandmother    Hypertension Paternal Grandfather    Diabetes Paternal Grandfather    Breast cancer Maternal Aunt    Colon cancer Neg Hx    Social History   Socioeconomic History   Marital status: Married    Spouse name: Not on file   Number of children: 2   Years of education: Not on file   Highest education level: Not on file  Occupational History   Occupation: Retired;  Doctor, general practice, [place]    Employer: RETIRED    Comment: Works a couple days a week  Tobacco Use   Smoking status: Never   Smokeless tobacco: Never  Scientific laboratory technician Use: Never used  Substance and Sexual Activity   Alcohol use: No    Alcohol/week: 0.0 standard drinks of alcohol   Drug use: No   Sexual activity: Yes  Other Topics Concern   Not on file  Social History Narrative   Her son died 2021-09-14.        Widowed 2009-09-14 after 75 years of marriage (husband had C.diff)   Remarried 09/14/2013   Right handed   One story home   Occasional caffeine   Social Determinants of Health   Financial Resource Strain: Low Risk  (08/02/2022)   Overall Financial Resource Strain (CARDIA)    Difficulty of Paying Living Expenses: Not hard at all  Food Insecurity: No Food Insecurity (08/02/2022)   Hunger Vital Sign    Worried About Running Out of Food in the Last Year: Never true    Ran Out of Food in the Last Year: Never true  Transportation Needs: No Transportation Needs (08/02/2022)   PRAPARE - Hydrologist (Medical): No    Lack of Transportation (Non-Medical): No  Physical Activity: Insufficiently Active (08/02/2022)   Exercise Vital Sign    Days of Exercise per Week: 2 days    Minutes of Exercise per Session: 30 min  Stress: No Stress Concern Present (08/02/2022)   [place]    Feeling of Stress : Not at all  Social Connections: Moderately Integrated (08/02/2022)   Social Connection and Isolation Panel [NHANES]    Frequency of Communication with Friends and Family: More than three times a week    Frequency of Social Gatherings with Friends and Family: More than three times a week    Attends Religious Services: More than 4 times per year    Active Member of [employer] or Organizations: No    Attends Archivist Meetings: Never    Marital Status: Married    Tobacco  Counseling Counseling given: Not Answered   Clinical Intake:  Pre-visit preparation completed: Yes  Pain : No/denies pain     Nutritional Risks: None Diabetes: Yes CBG done?:  No Did pt. bring in CBG monitor from home?: No  How often do you need to have someone help you when you read instructions, pamphlets, or other written materials from your doctor or pharmacy?: 1 - Never  Diabetic?Nutrition Risk Assessment:  Has the patient had any N/V/D within the last 2 months?  No  Does the patient have any non-healing wounds?  No  Has the patient had any unintentional weight loss or weight gain?  No   Diabetes:  Is the patient diabetic?  Yes  If diabetic, was a CBG obtained today?  No  Did the patient bring in their glucometer from home?  No  How often do you monitor your CBG's? QD.   Financial Strains and Diabetes Management:  Are you having any financial strains with the device, your supplies or your medication? No .  Does the patient want to be seen by Chronic Care Management for management of their diabetes?  No  Would the patient like to be referred to a Nutritionist or for Diabetic Management?  No   Diabetic Exams:  Diabetic Eye Exam: Completed 11/22/21 Diabetic Foot Exam: Completed 05/22/22 PCP    Interpreter Needed?: No  Information entered by :: C.Samika Vetsch LPN   Activities of Daily Living    08/02/2022   10:39 AM  In your present state of health, do you have any difficulty performing the following activities:  Hearing? 0  Vision? 0  Difficulty concentrating or making decisions? 0  Walking or climbing stairs? 0  Dressing or bathing? 0  Doing errands, shopping? 0  Preparing Food and eating ? N  Using the Toilet? N  In the past six months, have you accidently leaked urine? N  Comment occasional  Do you have problems with loss of bowel control? N  Managing your Medications? N  Managing your Finances? N  Housekeeping or managing your Housekeeping? N     Patient Care Team: Tonia Ghent, MD as PCP - General (Family Medicine) Josue Hector, MD as PCP - Cardiology (Cardiology) Kristeen Miss, MD as Consulting Physician (Neurosurgery) Jola Schmidt, MD as Consulting Physician (Ophthalmology) Charlton Haws, [facility] as Pharmacist (Pharmacist)  Indicate any recent Medical Services you may have received from other than [facility] providers in the past year (date may be approximate).     Assessment:   This is a routine wellness examination for [place].  Hearing/Vision screen Hearing Screening - Comments:: No aids Vision Screening - Comments:: Readers - [facility]  Dietary issues and exercise activities discussed: Current Exercise Habits: Home exercise routine, Type of exercise: Other - see comments (uses a stationary machine), Time (Minutes): 30, Frequency (Times/Week): 2, Weekly Exercise (Minutes/Week): 60, Intensity: Mild, Exercise limited by: None identified   Goals Addressed             This Visit's Progress    Patient Stated       No new goals.       Depression Screen    08/02/2022   10:34 AM 07/26/2021    1:25 PM 05/03/2021   11:32 AM 04/23/2020   10:42 AM 08/14/2019    3:18 PM 05/31/2017    1:07 PM 07/05/2016    2:41 PM  PHQ 2/9 Scores  PHQ - 2 Score 0 0 0 0 0 0 0  PHQ- 9 Score   1  0      Fall Risk    08/02/2022   10:38 AM 07/26/2021    1:21 PM 05/03/2021   11:31  AM 04/23/2020   10:42 AM 01/02/2020   12:49 PM  Fall Risk   Falls in the past year? 1 0 0 0 1  Comment Tripped in bathroom      Number falls in past yr: 0 0 0 0 1  Injury with Fall? 0 0 0 0 1  Risk for fall due to :  No Fall Risks No Fall Risks    Follow up Falls evaluation completed;Education provided;Falls prevention discussed Falls prevention discussed Falls evaluation completed Falls evaluation completed     FALL RISK PREVENTION PERTAINING TO THE HOME:  Any stairs in or around the home? No  If so, are there any without  handrails? No  Home free of loose throw rugs in walkways, pet beds, electrical cords, etc? Yes  Adequate lighting in your home to reduce risk of falls? Yes   ASSISTIVE DEVICES UTILIZED TO PREVENT FALLS:  Life alert? Yes  Use of a cane, walker or w/c? Yes  Grab bars in the bathroom? Yes  Shower chair or bench in shower? Yes  Elevated toilet seat or a handicapped toilet? Yes    Cognitive Function:    08/14/2019    3:21 PM 07/05/2016    3:07 PM 07/05/2016    3:04 PM  MMSE - Mini Mental State Exam  Orientation to time 5 5 5   Orientation to Place 5 5 5   Registration 3 3 3   Attention/ Calculation 5 0 0  Recall 3 3 3   Language- name 2 objects  0 0  Language- repeat 1 1 1   Language- follow 3 step command  3 3  Language- read & follow direction  0 0  Write a sentence  0 0  Copy design  0 0  Total score  20 20        08/02/2022   10:39 AM  6CIT Screen  What Year? 0 points  What month? 0 points  What time? 0 points  Count back from 20 0 points  Months in reverse 0 points  Repeat phrase 4 points  Total Score 4 points    Immunizations Immunization History  Administered Date(s) Administered   Fluad Quad(high Dose 65+) 05/03/2021, 05/22/2022   Influenza Split 02/22/2012   Influenza Whole 02/21/2006, 02/13/2007, 02/07/2008, 02/17/2009, 02/25/2010   Influenza, High Dose Seasonal PF 03/01/2017   Influenza,inj,Quad PF,6+ Mos 01/23/2015, 03/29/2016, 04/23/2018, 01/09/2019   Influenza,inj,quad, With Preservative 02/12/2017   Influenza-Unspecified 02/26/2014, 02/13/2016   PFIZER(Purple Top)SARS-COV-2 Vaccination 07/10/2019, 07/30/2019, 05/04/2020   Pneumococcal Conjugate-13 12/18/2013   Pneumococcal Polysaccharide-23 07/17/2000, 02/11/2015   Pneumococcal-Unspecified 07/14/2015   Td 05/15/1996, 01/15/2008   Tdap 02/22/2017   Zoster, Live 10/02/2011    TDAP status: Up to date  Flu Vaccine status: Up to date  Pneumococcal vaccine status: Up to date  Covid-19 vaccine status:  Declined, Education has been provided regarding the importance of this vaccine but patient still declined. Advised may receive this vaccine at local pharmacy or Health Dept.or vaccine clinic. Aware to provide a copy of the vaccination record if obtained from local pharmacy or Health Dept. Verbalized acceptance and understanding.  Qualifies for Shingles Vaccine? Yes   Zostavax completed Yes   Shingrix Completed?: No.    Education has been provided regarding the importance of this vaccine. Patient has been advised to call insurance company to determine out of pocket expense if they have not yet received this vaccine. Advised may also receive vaccine at local pharmacy or Health Dept. Verbalized acceptance and understanding.  Screening Tests  Health Maintenance  Topic Date Due   Zoster Vaccines- Shingrix (1 of 2) Never done   COVID-19 Vaccine (4 - 2023-24 season) 01/13/2022   COLONOSCOPY (Pts 45-34yrs Insurance coverage will need to be confirmed)  08/14/2023 (Originally 04/08/2017)   HEMOGLOBIN A1C  11/20/2022   OPHTHALMOLOGY EXAM  11/23/2022   Diabetic kidney evaluation - eGFR measurement  05/23/2023   FOOT EXAM  05/23/2023   Diabetic kidney evaluation - Urine ACR  05/30/2023   Medicare Annual Wellness (AWV)  08/02/2023   DTaP/Tdap/Td (4 - Td or Tdap) 02/23/2027   Pneumonia Vaccine 17+ Years old  Completed   INFLUENZA VACCINE  Completed   DEXA SCAN  Completed   HPV VACCINES  Aged Out    Health Maintenance  Health Maintenance Due  Topic Date Due   Zoster Vaccines- Shingrix (1 of 2) Never done   COVID-19 Vaccine (4 - 2023-24 season) 01/13/2022    Colorectal cancer screening: No longer required.   Mammogram status: Completed 09/01/21. Repeat every year pt will schedule.  Bone Density status: Completed 09/01/2021. Results reflect: Bone density results: OSTEOPOROSIS. Repeat every 2 years.  Lung Cancer Screening: (Low Dose CT Chest recommended if Age 62-80 years, 30 pack-year currently  smoking OR have quit w/in 15years.) does not qualify.   Lung Cancer Screening Referral: no  Additional Screening:  Hepatitis C Screening: does not qualify; Completed no  Vision Screening: Recommended annual ophthalmology exams for early detection of glaucoma and other disorders of the eye. Is the patient up to date with their annual eye exam?  Yes  Who is the provider or what is the name of the office in which the patient attends annual eye exams? [facility] If pt is not established with a provider, would they like to be referred to a provider to establish care? No .   Dental Screening: Recommended annual dental exams for proper oral hygiene  Community Resource Referral / Chronic Care Management: CRR required this visit?  No   CCM required this visit?  No      Plan:     I have personally reviewed and noted the following in the patient's chart:   Medical and social history Use of alcohol, tobacco or illicit drugs  Current medications and supplements including opioid prescriptions. Patient is currently taking opioid prescriptions. Information provided to patient regarding non-opioid alternatives. Patient advised to discuss non-opioid treatment plan with their provider. Functional ability and status Nutritional status Physical activity Advanced directives List of other physicians Hospitalizations, surgeries, and ER visits in previous 12 months Vitals Screenings to include cognitive, depression, and falls Referrals and appointments  In addition, I have reviewed and discussed with patient certain preventive protocols, quality metrics, and best practice recommendations. A written personalized care plan for preventive services as well as general preventive health recommendations were provided to patient.     Lebron Conners, LPN   [id number]   Nurse Notes: Pt declined COVID vaccines but will go to pharmacy for shingles vaccine. Pt stated she will call to  schedule mammogram.

## 2022-08-04 ENCOUNTER — Other Ambulatory Visit (INDEPENDENT_AMBULATORY_CARE_PROVIDER_SITE_OTHER): Payer: Medicare PPO

## 2022-08-04 DIAGNOSIS — E1159 Type 2 diabetes mellitus with other circulatory complications: Secondary | ICD-10-CM | POA: Diagnosis not present

## 2022-08-04 DIAGNOSIS — E538 Deficiency of other specified B group vitamins: Secondary | ICD-10-CM

## 2022-08-04 LAB — COMPREHENSIVE METABOLIC PANEL
ALT: 10 U/L (ref 0–35)
AST: 15 U/L (ref 0–37)
Albumin: 4.2 g/dL (ref 3.5–5.2)
Alkaline Phosphatase: 91 U/L (ref 39–117)
BUN: 19 mg/dL (ref 6–23)
CO2: 28 mEq/L (ref 19–32)
Calcium: 9.5 mg/dL (ref 8.4–10.5)
Chloride: 105 mEq/L (ref 96–112)
Creatinine, Ser: 1.06 mg/dL (ref 0.40–1.20)
GFR: 48.75 mL/min — ABNORMAL LOW (ref >60.00)
Glucose, Bld: 186 mg/dL — ABNORMAL HIGH (ref 70–99)
Potassium: 4.6 mEq/L (ref 3.5–5.1)
Sodium: 143 mEq/L (ref 135–145)
Total Bilirubin: 0.7 mg/dL (ref 0.2–1.2)
Total Protein: 6.6 g/dL (ref 6.0–8.3)

## 2022-08-04 LAB — LIPID PANEL
Cholesterol: 123 mg/dL (ref 0–200)
HDL: 44.4 mg/dL (ref >39.00)
LDL Cholesterol: 65 mg/dL (ref 0–99)
NonHDL: 78.45
Total CHOL/HDL Ratio: 3
Triglycerides: 65 mg/dL (ref 0.0–149.0)
VLDL: 13 mg/dL (ref 0.0–40.0)

## 2022-08-04 LAB — HEMOGLOBIN A1C: Hgb A1c MFr Bld: 6.3 % (ref 4.6–6.5)

## 2022-08-04 LAB — VITAMIN B12: Vitamin B-12: 269 pg/mL (ref 211–911)

## 2022-08-07 ENCOUNTER — Telehealth: Payer: Self-pay

## 2022-08-07 NOTE — Patient Outreach (Signed)
  Care Coordination   Initial Visit Note   08/07/2022 Name: ERYKA BUSTIN MRN: [id number] DOB: [birth year]  MALLERY BALBI is a 82 y.o. year old female who sees Tonia Ghent, MD for primary care. I spoke with  Thomasenia Sales by phone today.  What matters to the patients health and wellness today?  Patient denies having any nursing or community resource needs.  Declined care coordination services.     Goals Addressed             This Visit's Progress    care coordination activities - no further follow up needed.       Interventions Today    Flowsheet Row Most Recent Value  General Interventions   General Interventions Discussed/Reviewed General Interventions Discussed  [Care coordination program/ services discussed. SDOH survey completed.  Discussed AWV. Discussed importance in keeping vaccines up to date.  Advised to contact primary care provider office if care coordination services needed in the future.]              SDOH assessments and interventions completed:  Yes  SDOH Interventions Today    Flowsheet Row Most Recent Value  SDOH Interventions   Food Insecurity Interventions Intervention Not Indicated  Housing Interventions Intervention Not Indicated  Transportation Interventions Intervention Not Indicated        Care Coordination Interventions:  Yes, provided   Follow up plan: No further intervention required.   Encounter Outcome:  Pt. Visit Completed   Quinn Plowman RN,BSN,CCM [place] [phone] direct line

## 2022-08-07 NOTE — Telephone Encounter (Signed)
Sent. Thanks.   

## 2022-08-07 NOTE — Telephone Encounter (Signed)
Patient returned call and stated that she taking glyburide 5mg  twice daily.

## 2022-08-07 NOTE — Telephone Encounter (Signed)
LMTCB

## 2022-08-16 ENCOUNTER — Telehealth: Payer: Self-pay | Admitting: Family Medicine

## 2022-08-16 MED ORDER — OXYCODONE HCL 5 MG PO TABS: 5.0000 mg | ORAL_TABLET | Freq: Four times a day (QID) | ORAL | 0 refills | Status: DC | PRN

## 2022-08-16 NOTE — Telephone Encounter (Signed)
LOV - 05/22/22 NOV - 11/07/22 RF - 07/14/22 #30/0

## 2022-08-16 NOTE — Telephone Encounter (Signed)
Sent. Thanks.   

## 2022-08-16 NOTE — Telephone Encounter (Signed)
Prescription Request  08/16/2022  LOV: 05/22/2022  What is the name of the medication or equipment? oxyCODONE (OXY IR/ROXICODONE) 5 MG immediate release tablet   Have you contacted your pharmacy to request a refill? No   Which pharmacy would you like this sent to?  [facility], [address] [place] [address] Phone: [phone] Fax: [phone]    Patient notified that their request is being sent to the clinical staff for review and that they should receive a response within 2 business days.   Please advise at Mobile [phone] (mobile)

## 2022-08-16 NOTE — Addendum Note (Signed)
Addended by: Tonia Ghent on: 08/16/2022 11:14 PM   Modules accepted: Orders

## 2022-08-17 ENCOUNTER — Ambulatory Visit: Payer: Medicare PPO | Admitting: Family Medicine

## 2022-08-21 ENCOUNTER — Ambulatory Visit (INDEPENDENT_AMBULATORY_CARE_PROVIDER_SITE_OTHER): Payer: Medicare PPO

## 2022-08-21 DIAGNOSIS — I639 Cerebral infarction, unspecified: Secondary | ICD-10-CM

## 2022-08-21 LAB — CUP PACEART REMOTE DEVICE CHECK
Date Time Interrogation Session: 20240405230150
Implantable Pulse Generator Implant Date: 20220407

## 2022-08-22 NOTE — Progress Notes (Signed)
Carelink Summary Report / Loop Recorder 

## 2022-08-28 ENCOUNTER — Ambulatory Visit: Payer: Medicare PPO | Admitting: Family Medicine

## 2022-08-28 ENCOUNTER — Encounter: Payer: Self-pay | Admitting: Family Medicine

## 2022-08-28 ENCOUNTER — Ambulatory Visit (INDEPENDENT_AMBULATORY_CARE_PROVIDER_SITE_OTHER)
Admission: RE | Admit: 2022-08-28 | Discharge: 2022-08-28 | Disposition: A | Discharge status: Home / Self Care | Payer: Medicare PPO | Source: Ambulatory Visit | Attending: Family Medicine | Admitting: Family Medicine

## 2022-08-28 VITALS — BP 110/84 | HR 82 | Temp 98.0°F | Ht 65.0 in | Wt 151.0 lb

## 2022-08-28 DIAGNOSIS — E538 Deficiency of other specified B group vitamins: Secondary | ICD-10-CM

## 2022-08-28 DIAGNOSIS — M546 Pain in thoracic spine: Secondary | ICD-10-CM | POA: Diagnosis not present

## 2022-08-28 DIAGNOSIS — M549 Dorsalgia, unspecified: Secondary | ICD-10-CM | POA: Diagnosis not present

## 2022-08-28 DIAGNOSIS — M545 Low back pain, unspecified: Secondary | ICD-10-CM | POA: Diagnosis not present

## 2022-08-28 DIAGNOSIS — G894 Chronic pain syndrome: Secondary | ICD-10-CM

## 2022-08-28 DIAGNOSIS — M40204 Unspecified kyphosis, thoracic region: Secondary | ICD-10-CM | POA: Diagnosis not present

## 2022-08-28 DIAGNOSIS — M4126 Other idiopathic scoliosis, lumbar region: Secondary | ICD-10-CM | POA: Diagnosis not present

## 2022-08-28 MED ORDER — PREDNISONE 10 MG PO TABS: ORAL_TABLET | ORAL | 0 refills | Status: DC

## 2022-08-28 MED ORDER — CYANOCOBALAMIN 1000 MCG/ML IJ SOLN
1000.0000 ug | Freq: Once | INTRAMUSCULAR | Status: AC
  Administered 2022-08-28: 1000 ug via INTRAMUSCULAR

## 2022-08-28 MED ORDER — GLYBURIDE 5 MG PO TABS: 2.5000 mg | ORAL_TABLET | Freq: Two times a day (BID) | ORAL | Status: DC

## 2022-08-28 NOTE — Assessment & Plan Note (Signed)
Discussed options.  No change in pain medication at this point but take short course of prednisone with steroid cautions discussed with patient, especially regarding stopping NSAID use and potential for elevated sugar.  Check plain films of the lower back and refer for second opinion from neurosurgery.  I suspect that she has a mechanical issue in her lower back given that her symptoms clearly improve upon sitting and laying down and gets significantly worse upon standing.  Routine fall cautions discussed with patient. 30 minutes were devoted to patient care in this encounter (this includes time spent reviewing the patient's file/history, interviewing and examining the patient, counseling/reviewing plan with patient).

## 2022-08-28 NOTE — Patient Instructions (Addendum)
Take prednisone 2 a day for 5 days, then 1 a day for 5 days, with food. Don't take with aleve/ibuprofen. Go to the lab on the way out.   If you have mychart we'll likely use that to update you.    Take care.  Glad to see you.

## 2022-08-28 NOTE — Progress Notes (Signed)
Scraped R forearm on cabinet.  Fall cautions d/w pt.  Fall hx d/w pt.  He has fall alert button.  Family came and helped her up.    Still putting up with back pain, since prior surgery.  She doesn't have pain when supine or sitting but is having pain all day otherwise when upright.  She was asking about 2nd opinion with neurosurgery.  Referral placed.  D/w pt about trial of prednisone.  Her mood is worse when her pain is worse, as expected.    Oxycodone helps minimally with the pain.  Not sedated.  Meds, vitals, and allergies reviewed.   ROS: Per HPI unless specifically indicated in ROS section   nad ncat Neck supple, no LA Rrr Ctab Abd soft, not ttp Dec sensation B feet to monofilament.   Midline back not ttp R paraspinal muscles ttp.   Walking with walker/limp due to back pain.

## 2022-09-06 ENCOUNTER — Telehealth: Payer: Self-pay | Admitting: Family Medicine

## 2022-09-06 NOTE — Telephone Encounter (Signed)
Opened in error

## 2022-09-06 NOTE — Telephone Encounter (Signed)
Noted. Thanks.

## 2022-09-06 NOTE — Telephone Encounter (Signed)
Please check in with patient.  I was thinking about her situation, given her back pain and recent prednisone use, I would prefer to recheck her sooner rather than later.  See if she can get a follow-up appointment moved sooner.  We have openings on Mondays and Fridays on my schedule.  Thanks.

## 2022-09-06 NOTE — Telephone Encounter (Signed)
Patient notified as instructed by telephone and verbalized understanding. Patient stated that she has several other upcoming appointments scheduled already.  Patient scheduled an appointment 09/12/22 at 10:30 am with Dr. Para March which was the first day that she was available.

## 2022-09-12 ENCOUNTER — Ambulatory Visit: Payer: Medicare PPO | Admitting: Family Medicine

## 2022-09-12 ENCOUNTER — Encounter: Payer: Self-pay | Admitting: Family Medicine

## 2022-09-12 VITALS — BP 110/70 | HR 92 | Temp 97.7°F | Ht 65.0 in | Wt 150.0 lb

## 2022-09-12 DIAGNOSIS — R413 Other amnesia: Secondary | ICD-10-CM

## 2022-09-12 DIAGNOSIS — G894 Chronic pain syndrome: Secondary | ICD-10-CM | POA: Diagnosis not present

## 2022-09-12 NOTE — Patient Instructions (Addendum)
Check on your medicines at home and let me know if you need a refill.  I'll check on the [facility] appointment.    I think it makes sense to see [facility] to try to limit your fall risk.  I would not go out by yourself until we can get your back in better shape.   Let me know if you notice more memory changes.  Take care.  Glad to see you.

## 2022-09-12 NOTE — Progress Notes (Unsigned)
Discussed grief from loss of her son.  Mood d/w pt.    Back pain d/w pt.  Using her cane.  Walking distance d/w pt.  She hasn't heard about back clinic eval yet.  No help with prednisone.  She didn't have sig sugar elevation on prednisone.    She had cut back on driving, d/w pt about letting family drive. Fall cautions d/w pt.   Memory d/w pt.   Year, month, day wnl.  3/3 attention.  Math wnl.  Can read a watch.   3/3 recall.

## 2022-09-13 DIAGNOSIS — R413 Other amnesia: Secondary | ICD-10-CM | POA: Insufficient documentation

## 2022-09-13 NOTE — Assessment & Plan Note (Signed)
Could be affected by chronic pain, sleep changes, grief, or primary memory loss.  Brief testing was unremarkable but we agreed it would be reasonable to follow this episodically and have her update me as needed in the meantime.

## 2022-09-13 NOTE — Assessment & Plan Note (Signed)
I am going to check on her referral to the [facility] clinic.  Discussed not driving or going out by herself due to her fall risk.  Part of the point of seeing the [facility] clinic is to try to increase her mobility so that she will be safer and decrease her fall risk.

## 2022-09-18 ENCOUNTER — Telehealth: Payer: Self-pay | Admitting: Family Medicine

## 2022-09-18 NOTE — Telephone Encounter (Signed)
LOV - 09/12/22 NOV - 11/07/22 RF - 08/16/22 #30/0

## 2022-09-18 NOTE — Telephone Encounter (Signed)
Prescription Request  09/18/2022  LOV: 09/12/2022  What is the name of the medication or equipment? oxyCODONE (OXY IR/ROXICODONE) 5 MG immediate release tablet   Have you contacted your pharmacy to request a refill? No   Which pharmacy would you like this sent to?  [facility] - [place], [place] - [address] [place] [address] Phone: [phone] Fax: [phone]    Patient notified that their request is being sent to the clinical staff for review and that they should receive a response within 2 business days.   Please advise at Mobile [phone] (mobile)

## 2022-09-19 MED ORDER — OXYCODONE HCL 5 MG PO TABS: 5.0000 mg | ORAL_TABLET | Freq: Four times a day (QID) | ORAL | 0 refills | Status: DC | PRN

## 2022-09-19 NOTE — Telephone Encounter (Signed)
Sent. Thanks.   

## 2022-09-20 ENCOUNTER — Ambulatory Visit (INDEPENDENT_AMBULATORY_CARE_PROVIDER_SITE_OTHER): Payer: Medicare PPO

## 2022-09-20 DIAGNOSIS — I639 Cerebral infarction, unspecified: Secondary | ICD-10-CM

## 2022-09-21 LAB — CUP PACEART REMOTE DEVICE CHECK
Date Time Interrogation Session: 20240508230552
Implantable Pulse Generator Implant Date: 20220407

## 2022-09-26 NOTE — Progress Notes (Signed)
Carelink Summary Report / Loop Recorder 

## 2022-10-10 ENCOUNTER — Telehealth: Payer: Self-pay | Admitting: Family Medicine

## 2022-10-10 MED ORDER — OXYCODONE HCL 5 MG PO TABS: 5.0000 mg | ORAL_TABLET | Freq: Four times a day (QID) | ORAL | 0 refills | Status: DC | PRN

## 2022-10-10 NOTE — Telephone Encounter (Signed)
Prescription Request  10/10/2022  LOV: 09/12/2022  What is the name of the medication or equipment? oxyCODONE (OXY IR/ROXICODONE) 5 MG immediate release tablet   Have you contacted your pharmacy to request a refill? No   Which pharmacy would you like this sent to?  [facility] - [place], [place] - [address] [place] [address] Phone: [phone] Fax: [phone]    Patient notified that their request is being sent to the clinical staff for review and that they should receive a response within 2 business days.   Please advise at Mobile [phone] (mobile)

## 2022-10-10 NOTE — Telephone Encounter (Signed)
LOV - 09/12/22 NOV - 11/07/22 RF - 09/19/22 #30/0

## 2022-10-10 NOTE — Telephone Encounter (Signed)
Sent. Thanks.   

## 2022-10-11 NOTE — Progress Notes (Signed)
Carelink Summary Report / Loop Recorder 

## 2022-10-21 IMAGING — CT CT ANGIO NECK
1 of 11 series · 5 of 33 positions shown · IV contrast (OMNI 350)
Comparison: Prior head CT from 09/21/2019

CLINICAL DATA: Initial evaluation for acute TIA.

EXAM:
CT ANGIOGRAPHY HEAD AND NECK
TECHNIQUE: Multidetector CT imaging of the head and neck was performed using
the standard protocol during bolus administration of intravenous
contrast. Multiplanar CT image reconstructions and MIPs were
obtained to evaluate the vascular anatomy. Carotid stenosis
measurements (when applicable) are obtained utilizing NASCET
criteria, using the distal internal carotid diameter as the
denominator.
CONTRAST:  75mL OMNIPAQUE IOHEXOL 350 MG/ML SOLN

[Series 11: cta neck axial · axial · 0.47mm/px · z∈[-273,-53]mm · 5 of 332 slices shown]
[im 56/332  soft-tissue]
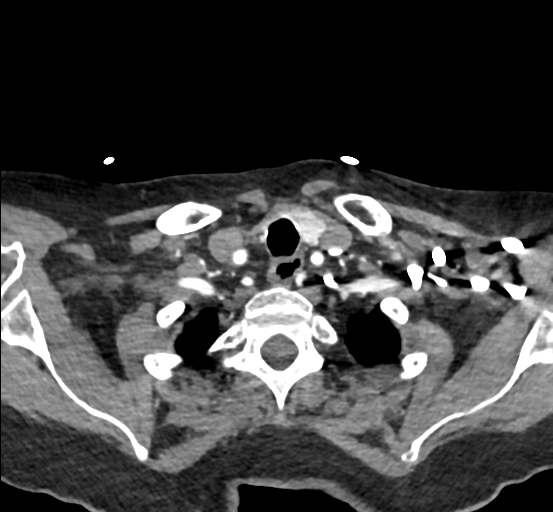
[im 111/332  bone]
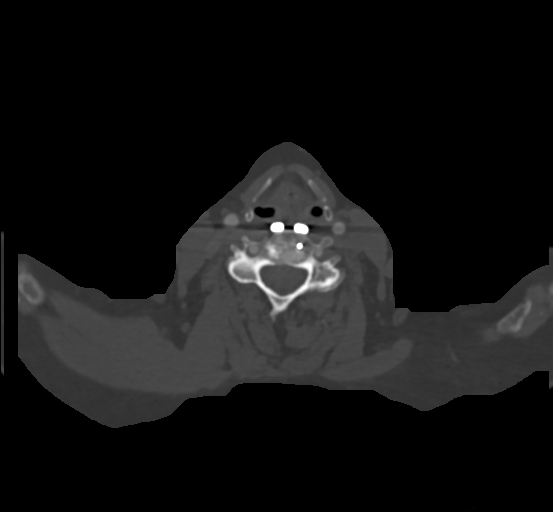
[im 166/332  soft-tissue]
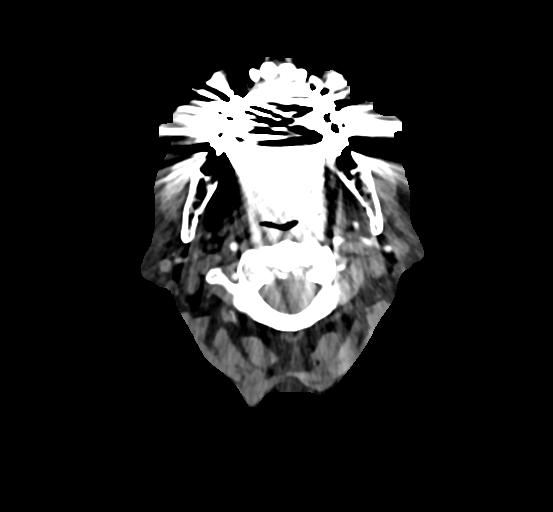
[im 221/332  bone]
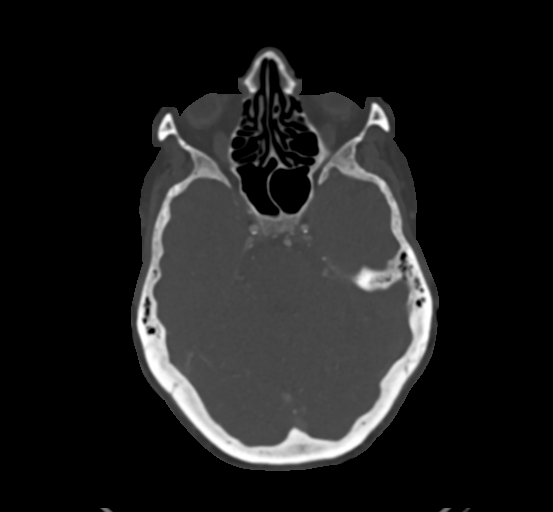
[im 276/332  soft-tissue]
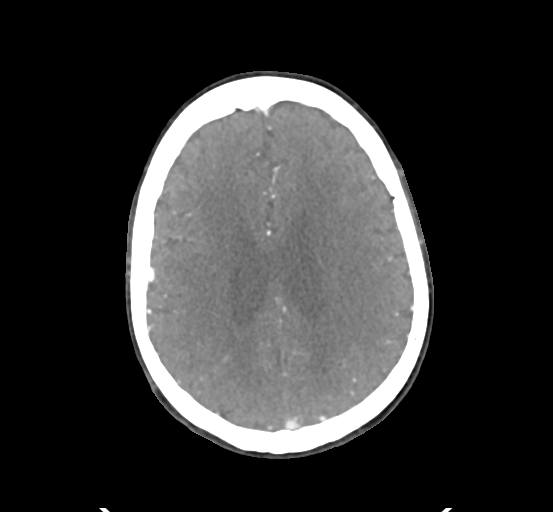

[5 of 33 positions shown; findings below may reference images not displayed]

FINDINGS: CT HEAD FINDINGS

Brain: Age-related cerebral atrophy with moderate to advanced
chronic microvascular ischemic disease. Remote lacunar infarcts at
the left basal ganglia and right frontal centrum semi ovale.

No acute intracranial hemorrhage. No acute large vessel territory
infarct. Suspected 7 mm meningioma overlying the right frontal
convexity without significant mass effect (series 11, image 67). No
other mass lesion, midline shift or mass effect. No hydrocephalus or
extra-axial fluid collection.

Vascular: No hyperdense vessel. Scattered vascular calcifications
noted within the carotid siphons.

Skull: Scalp soft tissues and calvarium within normal limits.

Sinuses: Paranasal sinuses mastoid air cells are clear.

Orbits: Globes and orbital soft tissues demonstrate no acute
finding.

Review of the MIP images confirms the above findings

CTA NECK FINDINGS

Aortic arch: Visualized aortic arch normal caliber with normal 3
vessel morphology. Mild atheromatous change about the origin of the
great vessels without hemodynamically significant stenosis.

Right carotid system: Right CCA patent from its origin to the
bifurcation without stenosis. Mild calcified plaque about the right
carotid bulb/proximal right ICA without significant stenosis. Right
ICA widely patent distally without stenosis, dissection or
occlusion.

Left carotid system: Left CCA patent from its origin to the
bifurcation without stenosis. Mild eccentric mixed plaque about the
left carotid bulb without significant stenosis. Left ICA patent
distally without stenosis, dissection or occlusion.

Vertebral arteries: Both vertebral arteries arise from subclavian
arteries. Vertebral arteries widely patent without stenosis,
dissection or occlusion.

Skeleton: No visible acute osseous finding. No discrete or worrisome
osseous lesions. Prior fusion at C4-C6. Grade 1 anterolisthesis of
C7 on T1.

Other neck: No other acute soft tissue abnormality within the neck.
No mass or adenopathy. 1.3 cm left thyroid nodule, felt to be of
doubtful significance given size and patient age, no follow-up
imaging recommended (ref: [HOSPITAL]. [DATE]): 143-50).

Upper chest: Visualized upper chest demonstrates no acute finding.

Review of the MIP images confirms the above findings

CTA HEAD FINDINGS

Anterior circulation: Petrous segments patent bilaterally. Scattered
atheromatous change within the carotid siphons with associated mild
multifocal narrowing. A1 segments patent bilaterally. Normal
anterior communicating artery complex. Left ACA widely patent to its
distal aspect. There is age indeterminate occlusion of the distal
right A2 segment (series 11, image 86). Attenuated irregular flow
seen distally within right ACA branches, likely collateral in
nature. M1 segments patent without stenosis. Normal MCA
bifurcations. No proximal MCA branch occlusion. Diffuse small vessel
atheromatous irregularity throughout the MCA branches bilaterally.

Posterior circulation: Both V4 segments patent to the
vertebrobasilar junction without stenosis. Right vertebral slightly
dominant. Both PICA origins patent and normal. Basilar patent to its
distal aspect without stenosis. Superior cerebellar arteries patent
bilaterally. Both PCAs primarily supplied via the basilar.
Atheromatous irregularity within both PCAs without high-grade
stenosis.

Venous sinuses: Grossly patent allowing for timing the contrast
bolus.

Anatomic variants: None significant.  No aneurysm.

Review of the MIP images confirms the above findings
IMPRESSION: CT HEAD IMPRESSION:

1. No acute intracranial abnormality identified. Specifically, no
evidence for acute or evolving ischemia.
2. Moderate to advanced chronic microvascular ischemic disease.
3. Probable 7 mm hemangioma overlying the right frontal convexity
without associated mass effect.

CTA HEAD AND NECK IMPRESSION:

1. Occlusion of the distal right A2 segment, age indeterminate.
Attenuated and irregular flow is seen distally within right ACA
branches, likely collateral.
2. Mild atherosclerotic change about the carotid bifurcations and
carotid siphons without hemodynamically significant stenosis.
3. Diffuse small vessel atheromatous irregularity elsewhere
throughout the intracranial circulation

## 2022-10-23 ENCOUNTER — Ambulatory Visit (INDEPENDENT_AMBULATORY_CARE_PROVIDER_SITE_OTHER): Payer: Medicare PPO

## 2022-10-23 DIAGNOSIS — I639 Cerebral infarction, unspecified: Secondary | ICD-10-CM | POA: Diagnosis not present

## 2022-10-23 LAB — CUP PACEART REMOTE DEVICE CHECK
Date Time Interrogation Session: 20240609230319
Implantable Pulse Generator Implant Date: 20220407

## 2022-10-30 ENCOUNTER — Telehealth: Payer: Self-pay | Admitting: Family Medicine

## 2022-10-30 NOTE — Telephone Encounter (Signed)
LOV - 09/12/22 NOV - 11/07/22  RF - 10/10/22 #30/0

## 2022-10-30 NOTE — Telephone Encounter (Signed)
Prescription Request  10/30/2022  LOV: 09/12/2022  What is the name of the medication or equipment? oxyCODONE (OXY IR/ROXICODONE) 5 MG immediate release tablet   Have you contacted your pharmacy to request a refill? No   Which pharmacy would you like this sent to?  [facility] - [place], [place] - [address] [place] [address] Phone: [phone] Fax: [phone]    Patient notified that their request is being sent to the clinical staff for review and that they should receive a response within 2 business days.   Please advise at [facility] [phone]

## 2022-10-31 MED ORDER — OXYCODONE HCL 5 MG PO TABS: 5.0000 mg | ORAL_TABLET | Freq: Four times a day (QID) | ORAL | 0 refills | Status: DC | PRN

## 2022-10-31 NOTE — Telephone Encounter (Signed)
Sent. Thanks.   

## 2022-10-31 NOTE — Addendum Note (Signed)
Addended by: Joaquim Nam on: 10/31/2022 06:55 AM   Modules accepted: Orders

## 2022-11-07 ENCOUNTER — Encounter: Payer: Self-pay | Admitting: Family Medicine

## 2022-11-07 ENCOUNTER — Ambulatory Visit: Payer: Medicare PPO | Admitting: Family Medicine

## 2022-11-07 VITALS — BP 124/70 | HR 78 | Temp 98.1°F | Ht 65.0 in | Wt 149.0 lb

## 2022-11-07 DIAGNOSIS — Z7984 Long term (current) use of oral hypoglycemic drugs: Secondary | ICD-10-CM | POA: Diagnosis not present

## 2022-11-07 DIAGNOSIS — E538 Deficiency of other specified B group vitamins: Secondary | ICD-10-CM | POA: Diagnosis not present

## 2022-11-07 DIAGNOSIS — E1159 Type 2 diabetes mellitus with other circulatory complications: Secondary | ICD-10-CM

## 2022-11-07 DIAGNOSIS — M549 Dorsalgia, unspecified: Secondary | ICD-10-CM | POA: Diagnosis not present

## 2022-11-07 LAB — POCT GLYCOSYLATED HEMOGLOBIN (HGB A1C): Hemoglobin A1C: 6 % — AB (ref 4.0–5.6)

## 2022-11-07 MED ORDER — CYANOCOBALAMIN 1000 MCG/ML IJ SOLN
1000.0000 ug | Freq: Once | INTRAMUSCULAR | Status: AC
Start: 2022-11-07 — End: 2022-11-07
  Administered 2022-11-07: 1000 ug via INTRAMUSCULAR

## 2022-11-07 NOTE — Progress Notes (Unsigned)
B12 def.  No injection since last OV, about 2 months ago.    She hasn't heard about her back clinic eval at [facility] yet.  I told her I would check on that.  She is still putting up with back pain at baseline.  No pain laying down, starts upon getting out of bed.  More pain standing, mid back pain.   We talked about limiting driving given her back pain/condition, at least for now.   We talked about her wanting to be able to drive and work in her flower beds at home.    Diabetes:  Using medications without difficulties: yes Hypoglycemic episodes: rare, cautions d/w pt.  Corrects with a snack Hyperglycemic episodes: no Feet problems: change in sensation at baseline.  Blood Sugars averaging: 90-100 in the AMs A1c done at OV.  6.  D/w pt .   Meds, vitals, and allergies reviewed.   ROS: Per HPI unless specifically indicated in ROS section   GEN: nad, alert HEENT: ncat NECK: supple w/o LA CV: rrr. PULM: ctab, no inc wob ABD: soft, +bs EXT: no edema SKIN: Well-perfused  30 minutes were devoted to patient care in this encounter (this includes time spent reviewing the patient's file/history, interviewing and examining the patient, counseling/reviewing plan with patient).

## 2022-11-07 NOTE — Patient Instructions (Addendum)
B12 shot today.  Repeat in 2 weeks.  See if you want to get your shots at home in the future.   Take care.  Glad to see you. Let me check on the [facility] clinic appointment.   I would like to recheck in about 3 months.  Labs at the visit.

## 2022-11-08 NOTE — Assessment & Plan Note (Signed)
A1c done at OV.  6.  D/w pt .  Would continue glyburide 2.5 mg twice a day with meals for now but if she has more frequent low sugars we can see about tapering her dose.

## 2022-11-08 NOTE — Assessment & Plan Note (Signed)
B12 shot today.  Repeat in 2 weeks.  She can see if she wants to get follow-up injections done here or if she wants to have a family member trained to do these at home.

## 2022-11-08 NOTE — Assessment & Plan Note (Signed)
  She hasn't heard about her back clinic eval at [facility] yet.  I told her I would check on that.  She is still putting up with back pain at baseline.  Continue oxycodone as needed in the meantime.  She does not take it when she takes tramadol.

## 2022-11-13 ENCOUNTER — Telehealth: Payer: Self-pay | Admitting: Family Medicine

## 2022-11-13 NOTE — Telephone Encounter (Signed)
LOV - 11/07/22 NOV - 02/08/23 RF - 10/31/22 #30/0

## 2022-11-13 NOTE — Progress Notes (Signed)
Carelink Summary Report / Loop Recorder 

## 2022-11-13 NOTE — Telephone Encounter (Signed)
Prescription Request  11/13/2022  LOV: 11/07/2022  What is the name of the medication or equipment? oxyCODONE (OXY IR/ROXICODONE) 5 MG immediate release tablet [[id number]]   Have you contacted your pharmacy to request a refill? No   Which pharmacy would you like this sent to?  [facility] - [place], [place] - [address] [place] [address] Phone: [phone] Fax: [phone]    Patient notified that their request is being sent to the clinical staff for review and that they should receive a response within 2 business days.   Please advise at Mobile [phone] (mobile)

## 2022-11-14 MED ORDER — OXYCODONE HCL 5 MG PO TABS: 5.0000 mg | ORAL_TABLET | Freq: Four times a day (QID) | ORAL | 0 refills | Status: DC | PRN

## 2022-11-14 NOTE — Addendum Note (Signed)
Addended by: Joaquim Nam on: 11/14/2022 07:18 AM   Modules accepted: Orders

## 2022-11-14 NOTE — Telephone Encounter (Signed)
Sent. Thanks.   

## 2022-11-22 ENCOUNTER — Ambulatory Visit (INDEPENDENT_AMBULATORY_CARE_PROVIDER_SITE_OTHER): Payer: Medicare PPO

## 2022-11-22 DIAGNOSIS — E538 Deficiency of other specified B group vitamins: Secondary | ICD-10-CM

## 2022-11-22 MED ORDER — CYANOCOBALAMIN 1000 MCG/ML IJ SOLN
1000.0000 ug | Freq: Once | INTRAMUSCULAR | Status: AC
Start: 2022-11-22 — End: 2022-11-22
  Administered 2022-11-22: 1000 ug via INTRAMUSCULAR

## 2022-11-22 NOTE — Progress Notes (Signed)
Per orders of Dr. Loleta Books is out of office and Dr Eustaquio Boyden who is in office injection of vitamin b 12 given by Lewanda Rife in left deltoid. Patient tolerated injection well. Patient will make appointment for 2 wks.

## 2022-11-27 ENCOUNTER — Other Ambulatory Visit: Payer: Self-pay | Admitting: Family Medicine

## 2022-11-27 ENCOUNTER — Ambulatory Visit: Payer: Medicare PPO

## 2022-11-27 DIAGNOSIS — I639 Cerebral infarction, unspecified: Secondary | ICD-10-CM | POA: Diagnosis not present

## 2022-11-27 LAB — CUP PACEART REMOTE DEVICE CHECK
Date Time Interrogation Session: 20240714230356
Implantable Pulse Generator Implant Date: 20220407

## 2022-11-27 MED ORDER — OXYCODONE HCL 5 MG PO TABS: 5.0000 mg | ORAL_TABLET | Freq: Four times a day (QID) | ORAL | 0 refills | Status: DC | PRN

## 2022-11-27 NOTE — Progress Notes (Signed)
Patient is due for oxycodone refill, husband asked about that at his appointment.  Rx sent.  Referral information given to patient.

## 2022-11-28 ENCOUNTER — Telehealth: Payer: Self-pay | Admitting: Family Medicine

## 2022-11-28 NOTE — Telephone Encounter (Signed)
Patient daughter called in and stated that [facility] will not schedule Maria [name] until they receive records. They can be faxed over [phone]. She will like a call when this is done. Thank you!

## 2022-11-28 NOTE — Telephone Encounter (Signed)
Patient daughter called back in returning call. Informed her that records have been sent over. Thank you!

## 2022-11-28 NOTE — Telephone Encounter (Signed)
Left message on daughters VM that records have now been sent over.

## 2022-12-07 ENCOUNTER — Ambulatory Visit (INDEPENDENT_AMBULATORY_CARE_PROVIDER_SITE_OTHER): Payer: Medicare PPO

## 2022-12-07 DIAGNOSIS — E538 Deficiency of other specified B group vitamins: Secondary | ICD-10-CM | POA: Diagnosis not present

## 2022-12-07 MED ORDER — CYANOCOBALAMIN 1000 MCG/ML IJ SOLN
1000.0000 ug | Freq: Once | INTRAMUSCULAR | Status: AC
Start: 2022-12-07 — End: 2022-12-07
  Administered 2022-12-07: 1000 ug via INTRAMUSCULAR

## 2022-12-07 NOTE — Progress Notes (Signed)
Per orders of Dr. Crawford Givens, injection of B-12 given by Nanci Pina in right deltoid. Patient tolerated injection well.

## 2022-12-08 NOTE — Progress Notes (Signed)
Carelink Summary Report / Loop Recorder 

## 2022-12-13 ENCOUNTER — Other Ambulatory Visit: Payer: Self-pay | Admitting: Family Medicine

## 2022-12-13 NOTE — Telephone Encounter (Signed)
Refill request for TRAMADOL HCL 50 MG TABS 50 Tablet   LOV - 11/07/22  Next OV - 02/08/23 Last refill - 08/01/22 #30/0

## 2022-12-15 ENCOUNTER — Encounter: Payer: Self-pay | Admitting: Pharmacist

## 2022-12-15 NOTE — Progress Notes (Signed)
Patient previously followed by [facility] pharmacist. Per clinical review, no pharmacist appointment needed at this time.

## 2022-12-18 ENCOUNTER — Telehealth: Payer: Self-pay | Admitting: Family Medicine

## 2022-12-18 NOTE — Telephone Encounter (Signed)
Prescription Request  12/18/2022  LOV: 11/07/2022  What is the name of the medication or equipment?  oxyCODONE (OXY IR/ROXICODONE) 5 MG immediate release tablet   Have you contacted your pharmacy to request a refill? No   Which pharmacy would you like this sent to?  [facility] - [place], [place] - [address] [place] [address] Phone: [phone] Fax: [phone]    Patient notified that their request is being sent to the clinical staff for review and that they should receive a response within 2 business days.   Please advise at [facility] [phone]

## 2022-12-18 NOTE — Telephone Encounter (Signed)
LOV - 11/07/22 NOV - 02/08/23 RF - 11/27/22 #60/0

## 2022-12-19 MED ORDER — OXYCODONE HCL 5 MG PO TABS: 5.0000 mg | ORAL_TABLET | Freq: Four times a day (QID) | ORAL | 0 refills | Status: DC | PRN

## 2022-12-19 NOTE — Telephone Encounter (Signed)
Please find out when she is going to see the [facility] clinic. Sent. Thanks.

## 2022-12-19 NOTE — Telephone Encounter (Signed)
Noted. Thanks.

## 2022-12-19 NOTE — Telephone Encounter (Signed)
Spoke with patient and her appt is Sept 16th

## 2022-12-20 ENCOUNTER — Encounter: Payer: Medicare PPO | Admitting: Pharmacist

## 2023-01-01 ENCOUNTER — Ambulatory Visit (INDEPENDENT_AMBULATORY_CARE_PROVIDER_SITE_OTHER): Payer: Medicare PPO

## 2023-01-01 DIAGNOSIS — I639 Cerebral infarction, unspecified: Secondary | ICD-10-CM | POA: Diagnosis not present

## 2023-01-01 LAB — CUP PACEART REMOTE DEVICE CHECK
Date Time Interrogation Session: 20240818230654
Implantable Pulse Generator Implant Date: 20220407

## 2023-01-11 NOTE — Progress Notes (Signed)
Carelink Summary Report / Loop Recorder 

## 2023-01-12 DIAGNOSIS — M48062 Spinal stenosis, lumbar region with neurogenic claudication: Secondary | ICD-10-CM | POA: Diagnosis not present

## 2023-01-16 ENCOUNTER — Telehealth: Payer: Self-pay | Admitting: Family Medicine

## 2023-01-16 NOTE — Telephone Encounter (Signed)
LOV - 11/07/22 NOV - 01/18/23 RF - 12/19/22 #60/0

## 2023-01-16 NOTE — Telephone Encounter (Signed)
Prescription Request  01/16/2023  LOV: 11/07/2022  What is the name of the medication or equipment? oxyCODONE (OXY IR/ROXICODONE) 5 MG immediate release tablet   Have you contacted your pharmacy to request a refill? No   Which pharmacy would you like this sent to?  [facility] - [place], [place] - [address] [address] Phone: [phone] Fax: [phone]    Patient notified that their request is being sent to the clinical staff for review and that they should receive a response within 2 business days.   Please advise at Mobile [phone] (mobile)

## 2023-01-17 MED ORDER — OXYCODONE HCL 5 MG PO TABS: 5.0000 mg | ORAL_TABLET | Freq: Four times a day (QID) | ORAL | 0 refills | Status: DC | PRN

## 2023-01-17 NOTE — Telephone Encounter (Signed)
Sent. Thanks.   

## 2023-01-17 NOTE — Addendum Note (Signed)
Addended by: Joaquim Nam on: 01/17/2023 03:00 PM   Modules accepted: Orders

## 2023-01-18 ENCOUNTER — Encounter: Payer: Self-pay | Admitting: Family Medicine

## 2023-01-18 ENCOUNTER — Ambulatory Visit: Payer: Medicare PPO | Admitting: Family Medicine

## 2023-01-18 VITALS — BP 146/78 | HR 88 | Temp 98.0°F | Ht 65.0 in | Wt 146.0 lb

## 2023-01-18 DIAGNOSIS — G894 Chronic pain syndrome: Secondary | ICD-10-CM

## 2023-01-18 DIAGNOSIS — Z23 Encounter for immunization: Secondary | ICD-10-CM

## 2023-01-18 DIAGNOSIS — E538 Deficiency of other specified B group vitamins: Secondary | ICD-10-CM | POA: Diagnosis not present

## 2023-01-18 MED ORDER — CYANOCOBALAMIN 1000 MCG/ML IJ SOLN
1000.0000 ug | Freq: Once | INTRAMUSCULAR | Status: AC
Start: 2023-01-18 — End: 2023-01-18
  Administered 2023-01-18: 1000 ug via INTRAMUSCULAR

## 2023-01-18 NOTE — Patient Instructions (Addendum)
I'll await the follow up from Dr. Danielle Dess.   B12 and flu shot today.   Ask the front about getting a nurse visit for B12 teaching.  Take care.  Glad to see you.

## 2023-01-18 NOTE — Progress Notes (Signed)
D/w pt about back pain and possible surgery.  Has [facility] eval pending.  Has seen neurosurgery locally.  Discussed plan per local neurosurgery clinic with plan for myelogram.  We talked about options of getting or not getting the myelogram, follow-up versus deferring follow-up with [facility], potentially getting a second opinion, etc.  She is frustrated with the back pain.  Oxycodone helps some with the pain but she has pain when she gets up and gets moving.  Overall the quality of her pain has not changed but it is a nagging and limiting issue for her.  Oxycodone does not eliminate the pain but it makes more manageable.  Due for B12 shot today.  She wants to get set up for home teaching.  Discussed.  Her sister may end up needing to come to the clinic here, to establish care.  D/w pt. patient was asking about that.  Discussed.  Meds, vitals, and allergies reviewed.   ROS: Per HPI unless specifically indicated in ROS section   Nad Ncat Neck supple Rrr Ctab  25 minutes were devoted to patient care in this encounter (this includes time spent reviewing the patient's file/history, interviewing and examining the patient, counseling/reviewing plan with patient).

## 2023-01-19 ENCOUNTER — Other Ambulatory Visit: Payer: Self-pay | Admitting: Neurological Surgery

## 2023-01-21 NOTE — Assessment & Plan Note (Signed)
Discussed options.  I have all confidence in Dr. Danielle Dess.  I think it makes sense to get the myelogram done as planned so she will have more information to make surgical decisions based on that.  She wanted to defer her [facility] clinic evaluation.  I will defer to patient.  I think it makes sense to continue oxycodone, she is not drowsy on med.  Cautions d/w pt

## 2023-01-22 ENCOUNTER — Other Ambulatory Visit (HOSPITAL_COMMUNITY): Payer: Self-pay | Admitting: Neurological Surgery

## 2023-01-22 ENCOUNTER — Encounter (HOSPITAL_COMMUNITY): Payer: Self-pay | Admitting: Neurological Surgery

## 2023-01-22 DIAGNOSIS — M48062 Spinal stenosis, lumbar region with neurogenic claudication: Secondary | ICD-10-CM

## 2023-01-24 ENCOUNTER — Ambulatory Visit: Payer: Medicare PPO

## 2023-01-30 ENCOUNTER — Other Ambulatory Visit: Payer: Self-pay | Admitting: Cardiovascular Disease

## 2023-02-01 ENCOUNTER — Telehealth: Payer: Self-pay

## 2023-02-01 ENCOUNTER — Ambulatory Visit: Payer: Medicare PPO

## 2023-02-01 DIAGNOSIS — E538 Deficiency of other specified B group vitamins: Secondary | ICD-10-CM

## 2023-02-01 MED ORDER — CYANOCOBALAMIN 1000 MCG/ML IJ SOLN
1000.0000 ug | Freq: Once | INTRAMUSCULAR | Status: AC
  Administered 2023-02-01: 1000 ug via INTRAMUSCULAR

## 2023-02-01 NOTE — Progress Notes (Signed)
Patient in office today for teaching on  vitamin b 12 injection*.  Printed off Instructions from [employer]. Patient reviewed instruction video on manufactures website Reviewed all instructions with patient and had repeat back to me. Instructed patient on proper cleaning of injection site and any signs of infection. Patient was able to administer medication with very little directions. Patient felt comfortable continuing in home setting. Reviewed with patient proper disposal of pen after use. Will call the office if any questions.

## 2023-02-01 NOTE — Telephone Encounter (Signed)
Pt had NV today and pt did well with admin of vitamin b 12 to herself; pt is requesting single dose vial of vitamin b 12 wit syriinges sent to [facility]. Sending note to Dr Para March.

## 2023-02-02 MED ORDER — CYANOCOBALAMIN 1000 MCG/ML IJ SOLN
INTRAMUSCULAR | 3 refills | Status: DC
Start: 2023-02-02 — End: 2024-03-31

## 2023-02-02 MED ORDER — "SYRINGE/NEEDLE (DISP) 25G X 1"" 3 ML MISC": 3 refills | Status: DC

## 2023-02-02 NOTE — Telephone Encounter (Signed)
Sent. Thanks.

## 2023-02-05 ENCOUNTER — Ambulatory Visit (INDEPENDENT_AMBULATORY_CARE_PROVIDER_SITE_OTHER): Payer: Medicare PPO

## 2023-02-05 DIAGNOSIS — Z8249 Family history of ischemic heart disease and other diseases of the circulatory system: Secondary | ICD-10-CM | POA: Diagnosis not present

## 2023-02-05 DIAGNOSIS — M4800 Spinal stenosis, site unspecified: Secondary | ICD-10-CM | POA: Diagnosis not present

## 2023-02-05 DIAGNOSIS — R2681 Unsteadiness on feet: Secondary | ICD-10-CM | POA: Diagnosis not present

## 2023-02-05 DIAGNOSIS — I639 Cerebral infarction, unspecified: Secondary | ICD-10-CM | POA: Diagnosis not present

## 2023-02-05 DIAGNOSIS — I2510 Atherosclerotic heart disease of native coronary artery without angina pectoris: Secondary | ICD-10-CM | POA: Diagnosis not present

## 2023-02-05 DIAGNOSIS — M419 Scoliosis, unspecified: Secondary | ICD-10-CM | POA: Diagnosis not present

## 2023-02-05 DIAGNOSIS — I672 Cerebral atherosclerosis: Secondary | ICD-10-CM | POA: Diagnosis not present

## 2023-02-05 DIAGNOSIS — M545 Low back pain, unspecified: Secondary | ICD-10-CM | POA: Diagnosis not present

## 2023-02-05 DIAGNOSIS — G319 Degenerative disease of nervous system, unspecified: Secondary | ICD-10-CM | POA: Diagnosis not present

## 2023-02-05 DIAGNOSIS — R030 Elevated blood-pressure reading, without diagnosis of hypertension: Secondary | ICD-10-CM | POA: Diagnosis not present

## 2023-02-05 LAB — CUP PACEART REMOTE DEVICE CHECK
Date Time Interrogation Session: 20240920230038
Implantable Pulse Generator Implant Date: 20220407

## 2023-02-07 ENCOUNTER — Ambulatory Visit (HOSPITAL_COMMUNITY): Payer: Medicare PPO

## 2023-02-07 ENCOUNTER — Encounter (HOSPITAL_COMMUNITY): Payer: Self-pay

## 2023-02-07 ENCOUNTER — Other Ambulatory Visit (HOSPITAL_COMMUNITY): Payer: Medicare PPO

## 2023-02-08 ENCOUNTER — Ambulatory Visit: Payer: Medicare PPO | Admitting: Family Medicine

## 2023-02-08 ENCOUNTER — Encounter: Payer: Self-pay | Admitting: Family Medicine

## 2023-02-08 VITALS — BP 122/80 | HR 88 | Temp 97.9°F | Ht 65.0 in | Wt 145.0 lb

## 2023-02-08 DIAGNOSIS — E538 Deficiency of other specified B group vitamins: Secondary | ICD-10-CM | POA: Diagnosis not present

## 2023-02-08 DIAGNOSIS — E1159 Type 2 diabetes mellitus with other circulatory complications: Secondary | ICD-10-CM

## 2023-02-08 DIAGNOSIS — M549 Dorsalgia, unspecified: Secondary | ICD-10-CM | POA: Diagnosis not present

## 2023-02-08 LAB — POCT GLYCOSYLATED HEMOGLOBIN (HGB A1C): Hemoglobin A1C: 6.2 % — AB (ref 4.0–5.6)

## 2023-02-08 NOTE — Progress Notes (Signed)
She is able to complete her B12 injections, discussed.    Diabetes:  Using medications without difficulties: yes Hypoglycemic episodes: no Hyperglycemic episodes: no Feet problems: some altered sensation on the bottom of the feet.  She can still feel the floor.   Blood Sugars averaging: usually ~100 in the AM, <200 in the PMs eye exam within last year: due, d/w pt.    Discussed back pain.  She couldn't get to her [facility] appointment- she was running late.  Her back pain has been better in the last few weeks.  D/w pt.  She has cancelled her myelogram in the meantime.    Meds, vitals, and allergies reviewed.  ROS: Per HPI unless specifically indicated in ROS section   GEN: nad, alert and oriented HEENT: ncat NECK: supple w/o LA CV: rrr. PULM: ctab, no inc wob ABD: soft, +bs EXT: no edema SKIN: no acute rash 30 minutes were devoted to patient care in this encounter (this includes time spent reviewing the patient's file/history, interviewing and examining the patient, counseling/reviewing plan with patient).

## 2023-02-08 NOTE — Patient Instructions (Addendum)
Don't change your meds for now.  A1c is still controlled.  Take care.  Glad to see you. Recheck in 3 months at an office visit.  We can get your labs done at the visit (likely A1c and B12).

## 2023-02-11 NOTE — Assessment & Plan Note (Signed)
Continue with B12 replacement as is.

## 2023-02-11 NOTE — Assessment & Plan Note (Signed)
Discussed back pain.  She couldn't get to her [facility] appointment- she was running late.  Her back pain has been better in the last few weeks.  D/w pt.  She has cancelled her myelogram in the meantime.   I will defer to patient.  She can always reschedule for myelogram in the future if needed.

## 2023-02-11 NOTE — Assessment & Plan Note (Signed)
I asked her not to change her medications for now.  A1c is still controlled.  Recheck in 3 months at an office visit.  Recheck labs at the visit (likely A1c and B12). Continue glyburide in the meantime.

## 2023-02-13 NOTE — Progress Notes (Signed)
Cardiology Office Note    Date:  02/19/2023   ID:  Maria [name], DOB Jun 26, 1940, MRN [id number]  PCP:  Joaquim Nam, MD  Cardiologist:  Charlton Haws, MD  Electrophysiologist:  None    History of Present Illness:   Maria [name] is a 82 y.o. female with history of CAD s/p DES to CX 2016, HTN, DM, GERD, HLD (intolerant of statins, hoarse voice with Repatha, myalgias with Praluent), SVT s/p ablation 2016, sinus pause by monitoring 2016 (BB stopped at that time), prior episode of syncope 2020 (hypoglycemia, possible orthostasis/vagal related to back pain), falls, RBBB/LAFB, mild carotid disease (1-39% BICA 2016), stroke s/p ILR. Last cath was in 2017 showing 30% prox RCA, 25% D1, patent LCx stent, normal LV function. Last stress test in 01/2018 was low risk, probable normal perfusion and mild soft tissue attenuation (diaphragm), no ischemia or scar.   April 2022 admitted  with stroke manifested by confusion, slurred speech and word finding difficulty. Cerebrovascular disease was noted on CTA with distal A2 occlusion. CT head showed no acute finding, 7 mm hemangioma overlying the right frontal convexity without associated mass-effect. MRI small acute on chronic white matter infarct at the right corona radiata, right superior frontal gyrus. 2D Echo 08/19/20 showed EF 60-65%, grade 2 DD, elevated LVEDP, mild aortic sclerosis without stenosis. Loop recorder was placed by EP team. Neurology recommended ASA 325 and Plavix 75 for 3 months and then ASA alone given large vessel stenosis/occlusion.  Loop with no PAF to date She struggles with chronic peripheral neuropathy, balance difficulty and falls. She walks with a walker. As a result, she self-discontinued her Plavix out of fear of falling/bleeding. She has trouble affording her Nexlizet   No angina/palpitations PACEART reviewed 09/05/21 normal   Seen by primary 09/28/21 with left foot pain in conjunction with her neuropathy and blister between 4/5  th toe Xray only arthritis no fracture   Has lived in same house for 63 years   She has some lumbar dx and seeing [facility] ? Need for myelogram and surgery She refuses to have any more surgery.   Bothered by neuropathy in feet BP high Repeat by me in office 160 / 95 mmHg Discussed starting ARB with her DM   Labwork independently reviewed: 08/2020 K 4.0, Cr 0.77, TSH wnl, Hgb 11.8 (similar to last value), Mg 1.6, LDL 127, A1C 9.4 08/2019 LFTs wnl   Past Medical History:  Diagnosis Date   Anemia 05/1985   Carotid artery disease (HCC)    a. 02/2015 - 1-39% bilaterally.   Chronic back pain    Concussion    Coronary artery disease    a. s/p DES to [facility] 01/2015.   DDD (degenerative disc disease), cervical    DDD (degenerative disc disease), lumbar    Fracture of lower leg 07/2001   Right   GERD (gastroesophageal reflux disease)    Hyperlipidemia    Hypertension    Multinodular thyroid    Pneumonia ?1990's X 1   PSVT (paroxysmal supraventricular tachycardia) (HCC)    a. s/p catheter ablation of a parahisian atrial tachycardia which was successfully ablated from the non-coronary cusp of the aortic root 06/2014.   RBBB with left anterior fascicular block    Renal cyst 07/10/2006   bilateral   Right knee pain 02/2010   Injected - Dr. Shelle Iron   Sinus pause    a. By event monitoring 09/2014 - BB stopped at that time.   Statin intolerance  Stroke [facility])    Syncope and collapse 07/2001   "that's when I broke my leg"   Type II diabetes mellitus (HCC) 2002    Past Surgical History:  Procedure Laterality Date   ANTERIOR CERVICAL DECOMP/DISCECTOMY FUSION  10/03/2005   C4/5; C5/6  "it's got a plate in there"   ANTERIOR LAT LUMBAR FUSION Left 02/04/2018   Procedure: Lumbar two-three Lumbar three-four Lumbar four-five  Anterolateral decompression/percutaneous posterior arthrodesis, Mazor;  Surgeon: Barnett Abu, MD;  Location: [facility] OR;  Service: Neurosurgery;  Laterality: Left;   APPLICATION OF  ROBOTIC ASSISTANCE FOR SPINAL PROCEDURE N/A 02/04/2018   Procedure: APPLICATION OF ROBOTIC ASSISTANCE FOR SPINAL PROCEDURE;  Surgeon: Barnett Abu, MD;  Location: [facility] OR;  Service: Neurosurgery;  Laterality: N/A;   BACK SURGERY     BREAST BIOPSY Bilateral 1968 (multiple)   "all benign"   BREAST LUMPECTOMY Bilateral 1968   CARDIAC CATHETERIZATION N/A 11/18/2014   Procedure: Left Heart Cath and Coronary Angiography;  Surgeon: Beryl Balz M [place], MD;  Location: [facility] INVASIVE CV LAB;  Service: Cardiovascular;  Laterality: N/A;   CARDIAC CATHETERIZATION N/A 01/22/2015   Procedure: Coronary Stent Intervention;  Surgeon: Burrel Legrand M [place], MD;  Location: [facility] INVASIVE CV LAB;  Service: Cardiovascular;  Laterality: N/A;   CARDIAC CATHETERIZATION N/A 06/10/2015   Procedure: Left Heart Cath and Coronary Angiography;  Surgeon: Fynn Adel M [place], MD;  Location: [facility] INVASIVE CV LAB;  Service: Cardiovascular;  Laterality: N/A;   CATARACT EXTRACTION W/ INTRAOCULAR LENS  IMPLANT, BILATERAL Bilateral 12/2010   CORONARY ANGIOPLASTY     ESOPHAGOGASTRODUODENOSCOPY  08/14/2005   gastropathy biopsy, negative   LAPAROSCOPIC CHOLECYSTECTOMY  08/1992   LOOP RECORDER INSERTION N/A 08/19/2020   Procedure: LOOP RECORDER INSERTION;  Surgeon: Marinus Maw, MD;  Location: [facility] INVASIVE CV LAB;  Service: Cardiovascular;  Laterality: N/A;   LUMBAR PERCUTANEOUS PEDICLE SCREW 3 LEVEL N/A 02/04/2018   Procedure: LUMBAR PERCUTANEOUS PEDICLE SCREW LUMBAR TWO - LUMBAR FIVE;  Surgeon: Barnett Abu, MD;  Location: [facility] OR;  Service: Neurosurgery;  Laterality: N/A;   POSTERIOR LAMINECTOMY / DECOMPRESSION LUMBAR SPINE  09/2001   due to herniated disc   SUPRAVENTRICULAR TACHYCARDIA ABLATION N/A 07/06/2014   Procedure: SUPRAVENTRICULAR TACHYCARDIA ABLATION;  Surgeon: Marinus Maw, MD;  Location: [facility] CATH LAB;  Service: Cardiovascular;  Laterality: N/A;   VAGINAL HYSTERECTOMY  1968   spotting    Current Medications: Current Meds  Medication Sig   aspirin  EC 81 MG tablet Take 81 mg by mouth daily. Swallow whole.   Bempedoic Acid-Ezetimibe (NEXLIZET) 180-10 MG TABS TAKE 1 TABLET BY MOUTH DAILY.   Blood Glucose Monitoring Suppl (ACCU-CHEK AVIVA PLUS) w/Device KIT 1 kit by Does not apply route as directed.   Cholecalciferol (VITAMIN D3) 50 MCG (2000 UT) capsule Take 1 capsule (2,000 Units total) by mouth daily.   cyanocobalamin (VITAMIN B12) 1000 MCG/ML injection 1000 mcg injected IM every 14 days.   diclofenac Sodium (VOLTAREN) 1 % GEL Apply 2 g topically 4 (four) times daily as needed.   glucose blood test strip Use as instructed, twice daily to check blood glucose.  Dx E11.9.   glyBURIDE (DIABETA) 5 MG tablet Take 0.5 tablets (2.5 mg total) by mouth 2 (two) times daily with a meal.   ibuprofen (ADVIL) 200 MG tablet Take 2-3 tablets (400-600 mg total) by mouth 2 (two) times daily as needed for moderate pain (with food).   loperamide (IMODIUM) 2 MG capsule Take 2 mg by mouth 4 (four) times daily as  needed for diarrhea or loose stools.    nitroGLYCERIN (NITROSTAT) 0.4 MG SL tablet PLACE 1 TABLET UNDER THE TONGUE EVERY 5 MINUTES AS NEEDED FOR CHEST PAIN FOR UP TO 3 DOSES   omeprazole (PRILOSEC) 20 MG capsule Take 1 capsule (20 mg total) by mouth 2 (two) times daily as needed (for acid reflux/indigestion.).   oxyCODONE (OXY IR/ROXICODONE) 5 MG immediate release tablet Take 1 tablet (5 mg total) by mouth every 6 (six) hours as needed (for back pain.  don't take with tramadol.  sedation caution).   SYRINGE-NEEDLE, DISP, 3 ML 25G X 1" 3 ML MISC Use with B12 injections   traMADol (ULTRAM) 50 MG tablet Take 1 tablet (50 mg total) by mouth every 6 (six) hours as needed (do not take with oxycodone).      Allergies:   Crestor [rosuvastatin], Ezetimibe-simvastatin, Pravastatin, Diazepam, Lyrica [pregabalin], Praluent [alirocumab], Repatha [evolocumab], Acetaminophen, Carvedilol, Gabapentin, Ibuprofen, Insulin glargine, Levemir [insulin detemir], Macrobid  [nitrofurantoin macrocrystal], Metformin, Metoprolol tartrate, and Naproxen   Social History   Socioeconomic History   Marital status: Married    Spouse name: Not on file   Number of children: 2   Years of education: Not on file   Highest education level: Not on file  Occupational History   Occupation: Retired; Hotel manager, [employer]    Employer: RETIRED    Comment: Works a couple days a week  Tobacco Use   Smoking status: Never   Smokeless tobacco: Never  Vaping Use   Vaping status: Never Used  Substance and Sexual Activity   Alcohol use: No    Alcohol/week: 0.0 standard drinks of alcohol   Drug use: No   Sexual activity: Yes  Other Topics Concern   Not on file  Social History Narrative   Her son died 2022-03-22.        Widowed Mar 22, 2010 after 52 years of marriage (husband had C.diff)   Remarried 03/22/14   Right handed   One story home   Occasional caffeine   Social Determinants of Health   Financial Resource Strain: Low Risk  (08/02/2022)   Overall Financial Resource Strain (CARDIA)    Difficulty of Paying Living Expenses: Not hard at all  Food Insecurity: No Food Insecurity (08/07/2022)   Hunger Vital Sign    Worried About Running Out of Food in the Last Year: Never true    Ran Out of Food in the Last Year: Never true  Transportation Needs: No Transportation Needs (08/07/2022)   PRAPARE - Administrator, Civil Service (Medical): No    Lack of Transportation (Non-Medical): No  Physical Activity: Insufficiently Active (08/02/2022)   Exercise Vital Sign    Days of Exercise per Week: 2 days    Minutes of Exercise per Session: 30 min  Stress: No Stress Concern Present (08/02/2022)   [employer] of Occupational Health - Occupational Stress Questionnaire    Feeling of Stress : Not at all  Social Connections: Moderately Integrated (08/02/2022)   Social Connection and Isolation Panel [NHANES]    Frequency of Communication with Friends and Family:  More than three times a week    Frequency of Social Gatherings with Friends and Family: More than three times a week    Attends Religious Services: More than 4 times per year    Active Member of [employer] or Organizations: No    Attends Banker Meetings: Never    Marital Status: Married     Family History:  The  patient's family history includes Breast cancer in her maternal aunt; Depression in her paternal aunt; Diabetes in her brother, maternal grandmother, paternal grandfather, paternal grandmother, and sister; Heart disease in her maternal grandfather and mother; Hypertension in her maternal grandfather, maternal grandmother, mother, paternal grandfather, and paternal grandmother; Myasthenia gravis in her sister; Stroke in her maternal grandmother. There is no history of Colon cancer.  ROS:   Please see the history of present illness.  All other systems are reviewed and otherwise negative.    EKGs/Labs/Other Studies Reviewed:    Studies reviewed are outlined and summarized above. Reports included below if pertinent.  2D Echo 08/19/20   1. Left ventricular ejection fraction, by estimation, is 60 to 65%. The  left ventricle has normal function. The left ventricle has no regional  wall motion abnormalities. Left ventricular diastolic parameters are  consistent with Grade II diastolic  dysfunction (pseudonormalization). Elevated left ventricular end-diastolic  pressure.   2. Right ventricular systolic function is normal. The right ventricular  size is normal. There is normal pulmonary artery systolic pressure.   3. The mitral valve is abnormal. No evidence of mitral valve  regurgitation. No evidence of mitral stenosis.   4. The aortic valve is tricuspid. There is mild calcification of the  aortic valve. Aortic valve regurgitation is not visualized. Mild aortic  valve sclerosis is present, with no evidence of aortic valve stenosis.   5. The inferior vena cava is normal in size  with greater than 50%  respiratory variability, suggesting right atrial pressure of 3 mmHg.   LHC 05/2015  Prox RCA lesion, 30% stenosed. 1st Diag lesion, 35% stenosed. The left ventricular systolic function is normal.   1. Nonobstructive CAD. Continued patency of the stent in the LCx. 2. Normal LV function.   Continue medical therapy. Consider switching Brilinta to alternative antiplatelet agent.     EKG:  02/19/2023 SR rate 83 RBBB LAD    Recent Labs: 08/04/2022: ALT 10; BUN 19; Creatinine, Ser 1.06; Potassium 4.6; Sodium 143  Recent Lipid Panel    Component Value Date/Time   CHOL 123 08/04/2022 0926   CHOL 188 03/31/2019 0832   TRIG 65.0 08/04/2022 0926   HDL 44.40 08/04/2022 0926   HDL 59 03/31/2019 0832   CHOLHDL 3 08/04/2022 0926   VLDL 13.0 08/04/2022 0926   LDLCALC 65 08/04/2022 0926   LDLCALC 81 08/14/2019 1414   LDLDIRECT 168.1 02/18/2010 0751    PHYSICAL EXAM:    VS:  BP (!) 180/98   Pulse 79   Ht 5\' 5"  (1.651 m)   Wt 150 lb (68 kg)   SpO2 99%   BMI 24.96 kg/m   BMI: Body mass index is 24.96 kg/m.  Affect appropriate Healthy:  appears stated age HEENT: normal Neck supple with no adenopathy JVP normal no bruits no thyromegaly Lungs clear with no wheezing and good diaphragmatic motion Heart:  S1/S2 no murmur, no rub, gallop or click PMI normal Abdomen: benighn, BS positve, no tenderness, no AAA no bruit.  No HSM or HJR Distal pulses intact with no bruits No edema Neuro non-focal Skin warm and dry No muscular weakness   Wt Readings from Last 3 Encounters:  02/19/23 150 lb (68 kg)  02/08/23 145 lb (65.8 kg)  01/18/23 146 lb (66.2 kg)     ASSESSMENT & PLAN:   1. Stroke:  cryptogenic no PAF on Linq carotids ok I She prefers to take ASA alone   2. CAD :  stable no angina  patent LCX stent cath 2017 non ischemic myovue 01/21/2018 Discussed lexiscan myouve to risk stratify before any proposed back surgery Defer since she is not going to have  surgery   3. History of SVT - quiescent. Will be monitored by ILR now.   4. HLD:  discussed compliance with Nexlizet   5. DM:  Discussed low carb diet.  Target hemoglobin A1c is 6.5 or less.  Continue current medications.  6. HTN:  start Cozaar 25 mg daily f/u primary    Disposition: F/u in 6 years   Medication Adjustments/Labs and Tests Ordered: Current medicines are reviewed at length with the patient today.  Concerns regarding medicines are outlined above. Medication changes, Labs and Tests ordered today are summarized above and listed in the Patient Instructions accessible in Encounters.   Signed, Charlton Haws, MD  02/19/2023 9:06 AM    [facility] [address] [place], [place], [place]  [address] Phone: [phone]; Fax: [phone]

## 2023-02-16 NOTE — Progress Notes (Signed)
Carelink Summary Report / Loop Recorder 

## 2023-02-19 ENCOUNTER — Ambulatory Visit: Payer: Medicare PPO | Attending: Cardiovascular Disease | Admitting: Cardiovascular Disease

## 2023-02-19 ENCOUNTER — Encounter: Payer: Self-pay | Admitting: Cardiovascular Disease

## 2023-02-19 VITALS — BP 180/98 | HR 79 | Ht 65.0 in | Wt 150.0 lb

## 2023-02-19 DIAGNOSIS — E785 Hyperlipidemia, unspecified: Secondary | ICD-10-CM | POA: Diagnosis not present

## 2023-02-19 DIAGNOSIS — I639 Cerebral infarction, unspecified: Secondary | ICD-10-CM | POA: Diagnosis not present

## 2023-02-19 DIAGNOSIS — I2510 Atherosclerotic heart disease of native coronary artery without angina pectoris: Secondary | ICD-10-CM | POA: Diagnosis not present

## 2023-02-19 DIAGNOSIS — I10 Essential (primary) hypertension: Secondary | ICD-10-CM

## 2023-02-19 MED ORDER — LOSARTAN POTASSIUM 25 MG PO TABS: 25.0000 mg | ORAL_TABLET | Freq: Every day | ORAL | 3 refills | Status: DC

## 2023-02-19 NOTE — Patient Instructions (Signed)
Medication Instructions:  Your physician has recommended you make the following change in your medication:  1-START Losartan 25 mg by mouth daily.   *If you need a refill on your cardiac medications before your next appointment, please call your pharmacy*  Lab Work: If you have labs (blood work) drawn today and your tests are completely normal, you will receive your results only by: MyChart Message (if you have MyChart) OR A paper copy in the mail If you have any lab test that is abnormal or we need to change your treatment, we will call you to review the results.  Testing/Procedures: None ordered today.  Follow-Up: At [facility], you and your health needs are our priority.  As part of our continuing mission to provide you with exceptional heart care, we have created designated Provider Care Teams.  These Care Teams include your primary Cardiologist (physician) and Advanced Practice Providers (APPs -  Physician Assistants and Nurse Practitioners) who all work together to provide you with the care you need, when you need it.  We recommend signing up for the patient portal called "MyChart".  Sign up information is provided on this After Visit Summary.  MyChart is used to connect with patients for Virtual Visits (Telemedicine).  Patients are able to view lab/test results, encounter notes, upcoming appointments, etc.  Non-urgent messages can be sent to your provider as well.   To learn more about what you can do with MyChart, go to [email or web address].    Your next appointment:   6 months  Provider:   Charlton Haws, MD

## 2023-02-27 ENCOUNTER — Other Ambulatory Visit: Payer: Self-pay | Admitting: Radiology

## 2023-02-27 ENCOUNTER — Other Ambulatory Visit: Payer: Self-pay | Admitting: Family Medicine

## 2023-02-27 DIAGNOSIS — R300 Dysuria: Secondary | ICD-10-CM

## 2023-02-27 NOTE — Progress Notes (Signed)
Patient sent urine sample re: dysuria.  Ucx pending.

## 2023-03-01 LAB — URINE CULTURE
MICRO NUMBER:: 15596920
SPECIMEN QUALITY:: ADEQUATE

## 2023-03-02 ENCOUNTER — Other Ambulatory Visit: Payer: Self-pay | Admitting: Family Medicine

## 2023-03-02 MED ORDER — CEPHALEXIN 500 MG PO CAPS: 500.0000 mg | ORAL_CAPSULE | Freq: Three times a day (TID) | ORAL | 0 refills | Status: DC

## 2023-03-12 ENCOUNTER — Ambulatory Visit: Payer: Medicare PPO

## 2023-03-12 DIAGNOSIS — I639 Cerebral infarction, unspecified: Secondary | ICD-10-CM

## 2023-03-13 LAB — CUP PACEART REMOTE DEVICE CHECK
Date Time Interrogation Session: 20241027230313
Implantable Pulse Generator Implant Date: 20220407

## 2023-03-20 ENCOUNTER — Telehealth: Payer: Self-pay | Admitting: Family Medicine

## 2023-03-20 NOTE — Telephone Encounter (Signed)
Last filled 03/02/2023 last office visit 02/08/2023 next office visit 05/03/2023

## 2023-03-20 NOTE — Telephone Encounter (Signed)
Prescription Request  03/20/2023  LOV: 02/08/2023  What is the name of the medication or equipment? cephALEXin (KEFLEX) 500 MG capsule   Have you contacted your pharmacy to request a refill? Yes   Which pharmacy would you like this sent to?  [facility] - [place], [place] - [address] [place] [address] Phone: [phone] Fax: [phone]    Patient notified that their request is being sent to the clinical staff for review and that they should receive a response within 2 business days.   Please advise at [facility] [phone]

## 2023-03-21 NOTE — Telephone Encounter (Signed)
Please get update on patient about the refill request and let me know.  Please find out about current symptoms, etc.  Thanks.

## 2023-03-21 NOTE — Telephone Encounter (Signed)
Left detailed voicemail per FYI for patient to call the office back for update on symptoms and if refill is still needed.

## 2023-03-22 ENCOUNTER — Other Ambulatory Visit: Payer: Self-pay | Admitting: Family Medicine

## 2023-03-22 NOTE — Telephone Encounter (Signed)
Sent. Thanks.   

## 2023-03-22 NOTE — Telephone Encounter (Signed)
LAST OFFICE VISIT:02/08/23 NEXT OFFICE VISIT: 05/03/23 LAST REFILL:  Tramadol 12/14/22

## 2023-03-23 MED ORDER — CEPHALEXIN 500 MG PO CAPS: 500.0000 mg | ORAL_CAPSULE | Freq: Three times a day (TID) | ORAL | 0 refills | Status: DC

## 2023-03-23 NOTE — Addendum Note (Signed)
Addended by: Joaquim Nam on: 03/23/2023 02:00 PM   Modules accepted: Orders

## 2023-03-23 NOTE — Telephone Encounter (Signed)
Left message to return call to our office.  

## 2023-03-23 NOTE — Telephone Encounter (Signed)
Resent rx.  If she isn't clearly better with resolution of sx with this rx, then needs recheck here at [facility].  Thanks.

## 2023-03-23 NOTE — Telephone Encounter (Signed)
Spoke to pt, pt states she's still having the same symptoms as before. Pt states her symptoms started to get a little better but now its about the same. Pt states she's still drinking her water. Pt states her husband is returning from out of town this evening & would appreciate if Para March could refill meds so her husband can pick up rx, upon his arrival. Pt requested a call back if rx can be sent in or not. Call back # [phone]

## 2023-03-26 NOTE — Telephone Encounter (Signed)
Valentino Nose, RN  You1 hour ago (4:18 PM)   AT Pt reports that her symptoms are improving.  She is aware that if her symptoms return that she will need to schedule an office visit for evaluation.  =========== Noted. Thanks.

## 2023-03-29 NOTE — Progress Notes (Signed)
Carelink Summary Report / Loop Recorder 

## 2023-04-04 ENCOUNTER — Telehealth: Payer: Self-pay | Admitting: Family Medicine

## 2023-04-04 MED ORDER — OXYCODONE HCL 5 MG PO TABS: 5.0000 mg | ORAL_TABLET | Freq: Four times a day (QID) | ORAL | 0 refills | Status: DC | PRN

## 2023-04-04 NOTE — Telephone Encounter (Signed)
Sent. Thanks.   

## 2023-04-04 NOTE — Telephone Encounter (Signed)
Prescription Request  04/04/2023  LOV: 02/08/2023  What is the name of the medication or equipment? oxyCODONE (OXY IR/ROXICODONE) 5 MG immediate release tablet   Have you contacted your pharmacy to request a refill? No   Which pharmacy would you like this sent to?  [facility] - [place], [place] - [address] [place] [address] Phone: [phone] Fax: [phone]    Patient notified that their request is being sent to the clinical staff for review and that they should receive a response within 2 business days.   Please advise at Mobile [phone] (mobile)

## 2023-04-15 LAB — CUP PACEART REMOTE DEVICE CHECK
Date Time Interrogation Session: 20241129230230
Implantable Pulse Generator Implant Date: 20220407

## 2023-04-16 ENCOUNTER — Ambulatory Visit (INDEPENDENT_AMBULATORY_CARE_PROVIDER_SITE_OTHER): Payer: Medicare PPO

## 2023-04-16 DIAGNOSIS — I639 Cerebral infarction, unspecified: Secondary | ICD-10-CM | POA: Diagnosis not present

## 2023-05-02 ENCOUNTER — Telehealth: Payer: Self-pay | Admitting: Family Medicine

## 2023-05-02 NOTE — Telephone Encounter (Signed)
Last office visit: 01/19/2023 Next office visit:05/14/23 Last refill:  oxyCODONE (OXY IR/ROXICODONE) 5 MG immediate release tablet 60 tablets with 0 refills

## 2023-05-02 NOTE — Telephone Encounter (Signed)
Prescription Request  05/02/2023  LOV: 02/08/2023  What is the name of the medication or equipment? oxyCODONE (OXY IR/ROXICODONE) 5 MG immediate release tablet   Have you contacted your pharmacy to request a refill? Yes   Which pharmacy would you like this sent to?  [facility] - [place], [place] - [address] [place] [address] Phone: [phone] Fax: [phone]    Patient notified that their request is being sent to the clinical staff for review and that they should receive a response within 2 business days.   Please advise at Mobile [phone] (mobile)

## 2023-05-03 ENCOUNTER — Ambulatory Visit: Payer: Medicare PPO | Admitting: Family Medicine

## 2023-05-03 ENCOUNTER — Other Ambulatory Visit: Payer: Self-pay | Admitting: Family Medicine

## 2023-05-03 MED ORDER — OXYCODONE HCL 5 MG PO TABS: 5.0000 mg | ORAL_TABLET | Freq: Four times a day (QID) | ORAL | 0 refills | Status: DC | PRN

## 2023-05-03 NOTE — Telephone Encounter (Signed)
Left message for patient to return call to office. I am needing to confirm what medications the patient is needing to be filled aside from the oxycodone that was sent today.

## 2023-05-03 NOTE — Telephone Encounter (Signed)
Copied from CRM [id number]. Topic: Clinical - Medication Refill >> May 03, 2023 10:55 AM Denese Killings wrote: Most Recent Primary Care Visit:  Provider: Joaquim Nam  Department: [facility]  Visit Type: OFFICE VISIT  Date: 02/08/2023  Medication: ***  Has the patient contacted their pharmacy?  (Agent: If no, request that the patient contact the pharmacy for the refill. If patient does not wish to contact the pharmacy document the reason why and proceed with request.) (Agent: If yes, when and what did the pharmacy advise?)  Is this the correct pharmacy for this prescription?  If no, delete pharmacy and type the correct one.  This is the patient's preferred pharmacy:  [facility] - [place], [place] - [address] [place] [address] Phone: [phone] Fax: [phone]   Has the prescription been filled recently?   Is the patient out of the medication?   Has the patient been seen for an appointment in the last year OR does the patient have an upcoming appointment?   Can we respond through MyChart?   Agent: Please be advised that Rx refills may take up to 3 business days. We ask that you follow-up with your pharmacy.

## 2023-05-03 NOTE — Addendum Note (Signed)
Addended by: Joaquim Nam on: 05/03/2023 11:39 AM   Modules accepted: Orders

## 2023-05-14 ENCOUNTER — Encounter: Payer: Self-pay | Admitting: Family Medicine

## 2023-05-14 ENCOUNTER — Ambulatory Visit: Payer: Medicare PPO | Admitting: Family Medicine

## 2023-05-14 VITALS — BP 136/84 | HR 72 | Temp 98.3°F | Ht 65.0 in | Wt 151.4 lb

## 2023-05-14 DIAGNOSIS — E1159 Type 2 diabetes mellitus with other circulatory complications: Secondary | ICD-10-CM

## 2023-05-14 DIAGNOSIS — G894 Chronic pain syndrome: Secondary | ICD-10-CM | POA: Diagnosis not present

## 2023-05-14 LAB — POCT GLYCOSYLATED HEMOGLOBIN (HGB A1C): Hemoglobin A1C: 6 % — AB (ref 4.0–5.6)

## 2023-05-14 MED ORDER — GLYBURIDE 5 MG PO TABS: 2.5000 mg | ORAL_TABLET | Freq: Every day | ORAL | Status: DC

## 2023-05-14 NOTE — Patient Instructions (Addendum)
Skip the evening dose of glyburide.  Try taking 1/2 tab in the AM only.  Take care.  Glad to see you. Plan on a yearly visit around April with labs ahead of time if possible.

## 2023-05-15 NOTE — Assessment & Plan Note (Signed)
Skip the evening dose of glyburide.  Try taking 1/2 tab in the AM only.  Plan on a yearly visit around April with labs ahead of time if possible.

## 2023-05-15 NOTE — Assessment & Plan Note (Signed)
Still taking oxycodone prn.  Not combining with tramadol.  Rare tramadol use.  Using a cane.  She wanted to defer any other surgeries and work up at this point.  No acute changes or weakness.  Continue as is.

## 2023-05-21 ENCOUNTER — Ambulatory Visit (INDEPENDENT_AMBULATORY_CARE_PROVIDER_SITE_OTHER): Payer: Medicare PPO

## 2023-05-21 DIAGNOSIS — I639 Cerebral infarction, unspecified: Secondary | ICD-10-CM | POA: Diagnosis not present

## 2023-05-21 LAB — CUP PACEART REMOTE DEVICE CHECK
Date Time Interrogation Session: 20250105230504
Implantable Pulse Generator Implant Date: 20220407

## 2023-06-06 ENCOUNTER — Ambulatory Visit: Payer: Medicare PPO | Admitting: Family Medicine

## 2023-06-06 ENCOUNTER — Encounter: Payer: Self-pay | Admitting: Family Medicine

## 2023-06-06 ENCOUNTER — Ambulatory Visit: Payer: Self-pay | Admitting: Family Medicine

## 2023-06-06 VITALS — BP 134/76 | HR 86 | Temp 98.1°F | Ht 65.0 in | Wt 150.0 lb

## 2023-06-06 DIAGNOSIS — R059 Cough, unspecified: Secondary | ICD-10-CM | POA: Diagnosis not present

## 2023-06-06 DIAGNOSIS — J029 Acute pharyngitis, unspecified: Secondary | ICD-10-CM

## 2023-06-06 LAB — POC COVID19 BINAXNOW: SARS Coronavirus 2 Ag: NEGATIVE

## 2023-06-06 LAB — POCT INFLUENZA A/B
Influenza A, POC: NEGATIVE
Influenza B, POC: NEGATIVE

## 2023-06-06 MED ORDER — AZITHROMYCIN 250 MG PO TABS: ORAL_TABLET | ORAL | 0 refills | Status: DC

## 2023-06-06 MED ORDER — BENZONATATE 200 MG PO CAPS: 200.0000 mg | ORAL_CAPSULE | Freq: Two times a day (BID) | ORAL | 0 refills | Status: DC | PRN

## 2023-06-06 NOTE — Assessment & Plan Note (Signed)
Acute, likely initial viral infection now with bacterial superinfection.  Negative rapid COVID and flu test   Treat with symptomatic care, rest fluid. Complete course of antibiotics.  Return and ER precautions given.

## 2023-06-06 NOTE — Progress Notes (Signed)
Patient ID: Maria [name], female    DOB: [birth year], 83 y.o.   MRN: [id number]  This visit was conducted in person.  BP 134/76   Pulse 86   Temp 98.1 F (36.7 C) (Oral)   Ht 5\' 5"  (1.651 m)   Wt 150 lb (68 kg)   SpO2 94%   BMI 24.96 kg/m    CC:  Chief Complaint  Patient presents with   Cough   Sore Throat   bodyache    Patient states that she has been feeling this way for 4 days. Nothing over the counter has been working    Subjective:   HPI: Maria [name] is a 83 y.o. female presenting on 06/06/2023 for Cough, Sore Throat, and bodyache (Patient states that she has been feeling this way for 4 days. Nothing over the counter has been working)  Date of onset:   significantly worse in last4 days... husband says cough has been going on more like 2 weeks Initial symptoms included  ST,  runny nose,  Symptoms progressed to   body aches, cough,  dry  NO fever.   No SOB, no wheeze.  Feeing tired.   No ear pain, no sinus pain   Sick contacts:  husband COVID testing:   none    She has tried to treat with  nyquil, alka seltzer     No history of chronic lung disease such as asthma or COPD. Has Diabetes. Non-smoker.      Relevant past medical, surgical, family and social history reviewed and updated as indicated. Interim medical history since our last visit reviewed. Allergies and medications reviewed and updated. Outpatient Medications Prior to Visit  Medication Sig Dispense Refill   aspirin EC 81 MG tablet Take 81 mg by mouth daily. Swallow whole.     Bempedoic Acid-Ezetimibe (NEXLIZET) 180-10 MG TABS TAKE 1 TABLET BY MOUTH DAILY. 90 tablet 0   Blood Glucose Monitoring Suppl (ACCU-CHEK AVIVA PLUS) w/Device KIT 1 kit by Does not apply route as directed. 1 kit 0   Cholecalciferol (VITAMIN D3) 50 MCG (2000 UT) capsule Take 1 capsule (2,000 Units total) by mouth daily.     cyanocobalamin (VITAMIN B12) 1000 MCG/ML injection 1000 mcg injected IM every 14 days. 6 mL 3    diclofenac Sodium (VOLTAREN) 1 % GEL Apply 2 g topically 4 (four) times daily as needed. 100 g 1   glucose blood test strip Use as instructed, twice daily to check blood glucose.  Dx E11.9. 100 each 12   glyBURIDE (DIABETA) 5 MG tablet Take 0.5 tablets (2.5 mg total) by mouth daily with breakfast.     ibuprofen (ADVIL) 200 MG tablet Take 2-3 tablets (400-600 mg total) by mouth 2 (two) times daily as needed for moderate pain (with food).     loperamide (IMODIUM) 2 MG capsule Take 2 mg by mouth 4 (four) times daily as needed for diarrhea or loose stools.      losartan (COZAAR) 25 MG tablet Take 1 tablet (25 mg total) by mouth daily. 90 tablet 3   nitroGLYCERIN (NITROSTAT) 0.4 MG SL tablet PLACE 1 TABLET UNDER THE TONGUE EVERY 5 MINUTES AS NEEDED FOR CHEST PAIN FOR UP TO 3 DOSES 25 tablet 7   omeprazole (PRILOSEC) 20 MG capsule Take 1 capsule (20 mg total) by mouth 2 (two) times daily as needed (for acid reflux/indigestion.). 180 capsule 3   oxyCODONE (OXY IR/ROXICODONE) 5 MG immediate release tablet Take 1 tablet (5 mg total)  by mouth every 6 (six) hours as needed (for back pain.  don't take with tramadol.  sedation caution). 60 tablet 0   SYRINGE-NEEDLE, DISP, 3 ML 25G X 1" 3 ML MISC Use with B12 injections 6 each 3   traMADol (ULTRAM) 50 MG tablet Take 1 tablet (50 mg total) by mouth every 6 (six) hours as needed. 60 tablet 2   No facility-administered medications prior to visit.     Per HPI unless specifically indicated in ROS section below Review of Systems  Constitutional:  Negative for fatigue and fever.  HENT:  Negative for congestion.   Eyes:  Negative for pain.  Respiratory:  Negative for cough and shortness of breath.   Cardiovascular:  Negative for chest pain, palpitations and leg swelling.  Gastrointestinal:  Negative for abdominal pain.  Genitourinary:  Negative for dysuria and vaginal bleeding.  Musculoskeletal:  Negative for back pain.  Neurological:  Negative for syncope,  light-headedness and headaches.  Psychiatric/Behavioral:  Negative for dysphoric mood.    Objective:  BP 134/76   Pulse 86   Temp 98.1 F (36.7 C) (Oral)   Ht 5\' 5"  (1.651 m)   Wt 150 lb (68 kg)   SpO2 94%   BMI 24.96 kg/m   Wt Readings from Last 3 Encounters:  06/06/23 150 lb (68 kg)  05/14/23 151 lb 6 oz (68.7 kg)  02/19/23 150 lb (68 kg)      Physical Exam Constitutional:      General: She is not in acute distress.    Appearance: She is well-developed. She is ill-appearing. She is not toxic-appearing.  HENT:     Head: Normocephalic.     Right Ear: Hearing, tympanic membrane, ear canal and external ear normal. Tympanic membrane is not erythematous, retracted or bulging.     Left Ear: Hearing, tympanic membrane, ear canal and external ear normal. Tympanic membrane is not erythematous, retracted or bulging.     Nose: Mucosal edema and rhinorrhea present.     Right Sinus: No maxillary sinus tenderness or frontal sinus tenderness.     Left Sinus: No maxillary sinus tenderness or frontal sinus tenderness.     Mouth/Throat:     Pharynx: Uvula midline.  Eyes:     General: Lids are normal. Lids are everted, no foreign bodies appreciated.     Conjunctiva/sclera: Conjunctivae normal.     Pupils: Pupils are equal, round, and reactive to light.  Neck:     Thyroid: No thyroid mass or thyromegaly.     Vascular: No carotid bruit.     Trachea: Trachea normal.  Cardiovascular:     Rate and Rhythm: Normal rate and regular rhythm.     Pulses: Normal pulses.     Heart sounds: Normal heart sounds, S1 normal and S2 normal. No murmur heard.    No friction rub. No gallop.  Pulmonary:     Effort: Pulmonary effort is normal. No tachypnea or respiratory distress.     Breath sounds: Normal breath sounds. No decreased breath sounds, wheezing, rhonchi or rales.  Musculoskeletal:     Cervical back: Normal range of motion and neck supple.  Skin:    General: Skin is warm and dry.     Findings:  No rash.  Neurological:     Mental Status: She is alert.  Psychiatric:        Mood and Affect: Mood is not anxious or depressed.        Speech: Speech normal.  Behavior: Behavior normal. Behavior is cooperative.        Judgment: Judgment normal.       Results for orders placed or performed in visit on 06/06/23  POC COVID-19 BinaxNow   Collection Time: 06/06/23 12:28 PM  Result Value Ref Range   SARS Coronavirus 2 Ag Negative Negative  POCT Influenza A/B   Collection Time: 06/06/23 12:29 PM  Result Value Ref Range   Influenza A, POC Negative Negative   Influenza B, POC Negative Negative    Assessment and Plan  Cough, unspecified type Assessment & Plan:  Acute, likely initial viral infection now with bacterial superinfection.  Negative rapid COVID and flu test   Treat with symptomatic care, rest fluid. Complete course of antibiotics.  Return and ER precautions given.  Orders: -     POC COVID-19 BinaxNow -     POCT Influenza A/B  Sore throat -     POC COVID-19 BinaxNow -     POCT Influenza A/B  Other orders -     Azithromycin; 2 tab po x 1 day then 1 tab po daily  Dispense: 6 tablet; Refill: 0 -     Benzonatate; Take 1 capsule (200 mg total) by mouth 2 (two) times daily as needed for cough.  Dispense: 20 capsule; Refill: 0    No follow-ups on file.   Kerby Nora, MD

## 2023-06-06 NOTE — Telephone Encounter (Signed)
Has OV today.  Thanks.

## 2023-06-06 NOTE — Telephone Encounter (Signed)
Copied from CRM [id number]. Topic: Clinical - Red Word Triage >> Jun 06, 2023  8:39 AM Maria [name] wrote: Red Word that prompted transfer to Nurse Triage: Patient [phone] states she's really sick, coughing phlegm green, sore throat, feeling weak, tight feeling in chest, wheezing, shortness of breath not from the heart, feels terrible.   Chief Complaint: Not feeling well Symptoms: productive cough with chest tight, sore throat, headache Frequency: Ongoing for 4 days Pertinent Negatives: Patient denies chest pain Disposition: [] ED /[] Urgent Care (no appt availability in office) / [x] Appointment(In office/virtual)/ []  [facility]/ [] Home Care/ [] Refused Recommended Disposition /[] [place]/ []  Follow-up with PCP Additional Notes: Patient stated she has been feeling sick for 4 days. She has a productive cough with green phlegm, sore throat, headache, fatigue. She has some shortness of breath only with activity. Patient is able to speak in complete sentences. Same day appt scheduled.  Reason for Disposition  MILD difficulty breathing (e.g., minimal/no SOB at rest, SOB with walking, pulse <100)  Answer Assessment - Initial Assessment Questions 1. ONSET: "When did the symptoms start?"      4 days ago  2. WORST SYMPTOM: "What is your worst symptom?" (e.g., cough, fever, shortness of breath, muscle aches)     Mucus,  body aching, tired  3. COUGH: "Do you have a cough?" If Yes, ask: "How bad is the cough?"       Coughing up green mucus, constant  4. FEVER: "Do you have a fever?" If Yes, ask: "What is your temperature, how was it measured, and when did it start?"     No  5. RESPIRATORY STATUS: "Describe your breathing?" (e.g., normal; shortness of breath, wheezing, unable to speak)      Some wheezing, shortness of breath when moving around  6. BETTER-SAME-WORSE: "Are you getting better, staying the same or getting worse compared to yesterday?"  If getting worse, ask, "In  what way?"     Getting worse   7. OTHER SYMPTOMS: "Do you have any other symptoms?"  (e.g., chills, fatigue, headache, loss of smell or taste, muscle pain, sore throat)     Headache, sore throat  Protocols used: Coronavirus (COVID-19) Diagnosed or Suspected-A-AH

## 2023-06-16 ENCOUNTER — Other Ambulatory Visit: Payer: Self-pay | Admitting: Cardiovascular Disease

## 2023-06-20 ENCOUNTER — Other Ambulatory Visit: Payer: Self-pay | Admitting: Family Medicine

## 2023-06-22 ENCOUNTER — Other Ambulatory Visit: Payer: Self-pay

## 2023-06-22 MED ORDER — NEXLIZET 180-10 MG PO TABS: 1.0000 | ORAL_TABLET | Freq: Every day | ORAL | 2 refills | Status: AC

## 2023-06-25 ENCOUNTER — Ambulatory Visit (INDEPENDENT_AMBULATORY_CARE_PROVIDER_SITE_OTHER): Payer: Medicare PPO

## 2023-06-25 DIAGNOSIS — I639 Cerebral infarction, unspecified: Secondary | ICD-10-CM

## 2023-06-26 LAB — CUP PACEART REMOTE DEVICE CHECK
Date Time Interrogation Session: 20250209230936
Implantable Pulse Generator Implant Date: 20220407

## 2023-06-27 NOTE — Progress Notes (Signed)
Carelink Summary Report / Loop Recorder

## 2023-07-09 ENCOUNTER — Inpatient Hospital Stay (HOSPITAL_COMMUNITY)
Admission: EM | Admit: 2023-07-09 | Discharge: 2023-07-14 | DRG: 382 | Disposition: A | Discharge status: Home Health | Payer: Medicare PPO | Attending: Internal Medicine | Admitting: Internal Medicine

## 2023-07-09 ENCOUNTER — Emergency Department (HOSPITAL_COMMUNITY): Payer: Medicare PPO

## 2023-07-09 ENCOUNTER — Other Ambulatory Visit: Payer: Self-pay

## 2023-07-09 ENCOUNTER — Encounter (HOSPITAL_COMMUNITY): Payer: Self-pay

## 2023-07-09 DIAGNOSIS — Z9841 Cataract extraction status, right eye: Secondary | ICD-10-CM | POA: Diagnosis not present

## 2023-07-09 DIAGNOSIS — Z9842 Cataract extraction status, left eye: Secondary | ICD-10-CM

## 2023-07-09 DIAGNOSIS — I10 Essential (primary) hypertension: Secondary | ICD-10-CM | POA: Diagnosis present

## 2023-07-09 DIAGNOSIS — K219 Gastro-esophageal reflux disease without esophagitis: Secondary | ICD-10-CM | POA: Diagnosis present

## 2023-07-09 DIAGNOSIS — Z7982 Long term (current) use of aspirin: Secondary | ICD-10-CM

## 2023-07-09 DIAGNOSIS — Z7984 Long term (current) use of oral hypoglycemic drugs: Secondary | ICD-10-CM

## 2023-07-09 DIAGNOSIS — D72829 Elevated white blood cell count, unspecified: Secondary | ICD-10-CM | POA: Diagnosis present

## 2023-07-09 DIAGNOSIS — Z8249 Family history of ischemic heart disease and other diseases of the circulatory system: Secondary | ICD-10-CM

## 2023-07-09 DIAGNOSIS — Z8673 Personal history of transient ischemic attack (TIA), and cerebral infarction without residual deficits: Secondary | ICD-10-CM

## 2023-07-09 DIAGNOSIS — R079 Chest pain, unspecified: Principal | ICD-10-CM

## 2023-07-09 DIAGNOSIS — Z833 Family history of diabetes mellitus: Secondary | ICD-10-CM | POA: Diagnosis not present

## 2023-07-09 DIAGNOSIS — E119 Type 2 diabetes mellitus without complications: Secondary | ICD-10-CM | POA: Diagnosis present

## 2023-07-09 DIAGNOSIS — Z634 Disappearance and death of family member: Secondary | ICD-10-CM | POA: Diagnosis not present

## 2023-07-09 DIAGNOSIS — I2510 Atherosclerotic heart disease of native coronary artery without angina pectoris: Secondary | ICD-10-CM | POA: Diagnosis present

## 2023-07-09 DIAGNOSIS — R112 Nausea with vomiting, unspecified: Secondary | ICD-10-CM | POA: Diagnosis not present

## 2023-07-09 DIAGNOSIS — Z79899 Other long term (current) drug therapy: Secondary | ICD-10-CM | POA: Diagnosis not present

## 2023-07-09 DIAGNOSIS — Z90710 Acquired absence of both cervix and uterus: Secondary | ICD-10-CM | POA: Diagnosis not present

## 2023-07-09 DIAGNOSIS — E785 Hyperlipidemia, unspecified: Secondary | ICD-10-CM | POA: Diagnosis present

## 2023-07-09 DIAGNOSIS — Z961 Presence of intraocular lens: Secondary | ICD-10-CM | POA: Diagnosis present

## 2023-07-09 DIAGNOSIS — Z981 Arthrodesis status: Secondary | ICD-10-CM | POA: Diagnosis not present

## 2023-07-09 DIAGNOSIS — Z823 Family history of stroke: Secondary | ICD-10-CM | POA: Diagnosis not present

## 2023-07-09 DIAGNOSIS — K2210 Ulcer of esophagus without bleeding: Secondary | ICD-10-CM | POA: Diagnosis present

## 2023-07-09 DIAGNOSIS — R1310 Dysphagia, unspecified: Secondary | ICD-10-CM | POA: Diagnosis not present

## 2023-07-09 DIAGNOSIS — Z955 Presence of coronary angioplasty implant and graft: Secondary | ICD-10-CM | POA: Diagnosis not present

## 2023-07-09 DIAGNOSIS — R531 Weakness: Secondary | ICD-10-CM | POA: Diagnosis not present

## 2023-07-09 DIAGNOSIS — Z1152 Encounter for screening for COVID-19: Secondary | ICD-10-CM | POA: Diagnosis not present

## 2023-07-09 DIAGNOSIS — E042 Nontoxic multinodular goiter: Secondary | ICD-10-CM | POA: Diagnosis present

## 2023-07-09 DIAGNOSIS — Z888 Allergy status to other drugs, medicaments and biological substances status: Secondary | ICD-10-CM | POA: Diagnosis not present

## 2023-07-09 DIAGNOSIS — Z886 Allergy status to analgesic agent status: Secondary | ICD-10-CM | POA: Diagnosis not present

## 2023-07-09 DIAGNOSIS — I700 Atherosclerosis of aorta: Secondary | ICD-10-CM | POA: Diagnosis not present

## 2023-07-09 DIAGNOSIS — R0789 Other chest pain: Secondary | ICD-10-CM | POA: Diagnosis not present

## 2023-07-09 LAB — RESP PANEL BY RT-PCR (RSV, FLU A&B, COVID)  RVPGX2
Influenza A by PCR: NEGATIVE
Influenza B by PCR: NEGATIVE
Resp Syncytial Virus by PCR: NEGATIVE
SARS Coronavirus 2 by RT PCR: NEGATIVE

## 2023-07-09 LAB — COMPREHENSIVE METABOLIC PANEL
ALT: 15 U/L (ref 0–44)
AST: 19 U/L (ref 15–41)
Albumin: 3.9 g/dL (ref 3.5–5.0)
Alkaline Phosphatase: 72 U/L (ref 38–126)
Anion gap: 16 — ABNORMAL HIGH (ref 5–15)
BUN: 26 mg/dL — ABNORMAL HIGH (ref 8–23)
CO2: 19 mmol/L — ABNORMAL LOW (ref 22–32)
Calcium: 9.8 mg/dL (ref 8.9–10.3)
Chloride: 103 mmol/L (ref 98–111)
Creatinine, Ser: 1.09 mg/dL — ABNORMAL HIGH (ref 0.44–1.00)
GFR, Estimated: 50 mL/min — ABNORMAL LOW (ref >60)
Glucose, Bld: 195 mg/dL — ABNORMAL HIGH (ref 70–99)
Potassium: 4.5 mmol/L (ref 3.5–5.1)
Sodium: 138 mmol/L (ref 135–145)
Total Bilirubin: 0.9 mg/dL (ref 0.0–1.2)
Total Protein: 7.3 g/dL (ref 6.5–8.1)

## 2023-07-09 LAB — CBC WITH DIFFERENTIAL/PLATELET
Abs Immature Granulocytes: 0.06 10*3/uL (ref 0.00–0.07)
Basophils Absolute: 0 10*3/uL (ref 0.0–0.1)
Basophils Relative: 0 %
Eosinophils Absolute: 0 10*3/uL (ref 0.0–0.5)
Eosinophils Relative: 0 %
HCT: 43.5 % (ref 36.0–46.0)
Hemoglobin: 14.6 g/dL (ref 12.0–15.0)
Immature Granulocytes: 0 %
Lymphocytes Relative: 11 %
Lymphs Abs: 1.4 10*3/uL (ref 0.7–4.0)
MCH: 30.6 pg (ref 26.0–34.0)
MCHC: 33.6 g/dL (ref 30.0–36.0)
MCV: 91.2 fL (ref 80.0–100.0)
Monocytes Absolute: 1.1 10*3/uL — ABNORMAL HIGH (ref 0.1–1.0)
Monocytes Relative: 8 %
Neutro Abs: 11 10*3/uL — ABNORMAL HIGH (ref 1.7–7.7)
Neutrophils Relative %: 81 %
Platelets: 433 10*3/uL — ABNORMAL HIGH (ref 150–400)
RBC: 4.77 MIL/uL (ref 3.87–5.11)
RDW: 12.3 % (ref 11.5–15.5)
WBC: 13.6 10*3/uL — ABNORMAL HIGH (ref 4.0–10.5)
nRBC: 0 % (ref 0.0–0.2)

## 2023-07-09 LAB — TROPONIN I (HIGH SENSITIVITY)
Troponin I (High Sensitivity): 9 ng/L (ref <18)
Troponin I (High Sensitivity): 8 ng/L (ref <18)

## 2023-07-09 LAB — LIPASE, BLOOD: Lipase: 28 U/L (ref 11–51)

## 2023-07-09 MED ORDER — LIDOCAINE VISCOUS HCL 2 % MT SOLN
15.0000 mL | Freq: Once | OROMUCOSAL | Status: AC
  Administered 2023-07-09: 15 mL via ORAL
  Filled 2023-07-09: qty 15

## 2023-07-09 MED ORDER — LACTATED RINGERS IV BOLUS
500.0000 mL | Freq: Once | INTRAVENOUS | Status: AC
  Administered 2023-07-09: 500 mL via INTRAVENOUS

## 2023-07-09 MED ORDER — FAMOTIDINE 20 MG PO TABS
20.0000 mg | ORAL_TABLET | Freq: Once | ORAL | Status: AC
Start: 2023-07-09 — End: 2023-07-09
  Administered 2023-07-09: 20 mg via ORAL
  Filled 2023-07-09: qty 1

## 2023-07-09 MED ORDER — OXYCODONE HCL 5 MG PO TABS
5.0000 mg | ORAL_TABLET | Freq: Once | ORAL | Status: AC
  Administered 2023-07-09: 5 mg via ORAL
  Filled 2023-07-09: qty 1

## 2023-07-09 MED ORDER — ALUM & MAG HYDROXIDE-SIMETH 200-200-20 MG/5ML PO SUSP
30.0000 mL | Freq: Once | ORAL | Status: AC
  Administered 2023-07-09: 30 mL via ORAL
  Filled 2023-07-09: qty 30

## 2023-07-09 MED ORDER — IOHEXOL 350 MG/ML SOLN
57.0000 mL | Freq: Once | INTRAVENOUS | Status: AC | PRN
  Administered 2023-07-09: 57 mL via INTRAVENOUS

## 2023-07-09 MED ORDER — ONDANSETRON HCL 4 MG/2ML IJ SOLN
4.0000 mg | Freq: Once | INTRAMUSCULAR | Status: AC
  Administered 2023-07-09: 4 mg via INTRAVENOUS
  Filled 2023-07-09: qty 2

## 2023-07-09 NOTE — ED Provider Notes (Signed)
 [place] EMERGENCY DEPARTMENT AT [facility] Provider Note  Arrival date/time:07/09/2023 11:28 PM  HPI/ROS   Maria [name] is a 83 y.o. female with PMH significant for hyperlipidemia, HTN, hiatal hernia and GERD, degenerative disc disease, T2DM, SVT, CAD who presents for patient  History is provided by patient and husband.  For the past 3 days, patient has been experiencing intermittent chest pain that radiates into her back.  She is not sure if it something serious to be concerned about or if it something just like acid reflux.  She did think that she was having acid reflux once in the past but it turned out to be a heart attack. The pain got worse over the past day, prompting her presentation today. She also endorses persistent nausea, vomiting and difficulty tolerating p.o. over the past few days.  She denies cough, congestion, shortness of breath.  A complete ROS was performed with pertinent positives/negatives noted above.   ED Course and Medical Decision Making   I personally reviewed the patient's vitals.  Assessment/Plan: This is an 83 year old patient with history of GERD, hiatal hernia, and CAD who is presenting for chest pain and food regurgitation.  EKG without ischemic changes.  Troponin negative x 2. Low suspicion for ACS given workup.  Chest x-ray without signs of infiltrate, edema, soft tissue injury or pneumothorax  Considering atypical presentation of pulmonary embolism.  Will obtain CTA chest.  CBC shows mild leukocytosis to 13.6, no anemia. CMP shows no metabolic derangement, no AKI, no liver injury. Lipase WNL.  On my reevaluation, patient symptoms have improved with GI cocktail. She is tolerating clear liquids without issue, however when attempting to swallow solids, including applesauce, she is unable.  She has history of hiatal hernia and I'm concerned for worsening hernia vs stricture.  Will plan to admit patient overnight with GI team to see  in the morning. I messaged the night team on call, Venetia Maxon, MD, to make them aware of patient.  Disposition:  I discussed the case with hospitalist who graciously agreed to admit the patient to their service for continued care.   Clinical Impression:  1. Chest pain, unspecified type     Rx / DC Orders ED Discharge Orders     None       The plan for this patient was discussed with Dr. Jeraldine Loots, who voiced agreement and who oversaw evaluation and treatment of this patient.   Clinical Complexity A medically appropriate history, review of systems, and physical exam was performed.  Patient's presentation is most consistent with acute presentation with potential threat to life or bodily function.  Medical Decision Making Amount and/or Complexity of Data Reviewed Radiology: ordered.  Risk OTC drugs. Prescription drug management. Decision regarding hospitalization.    Physical Exam and Medical History   Vitals:   07/09/23 1508 07/09/23 1509 07/09/23 2056 07/09/23 2323  BP:  139/78 (!) 181/87 (!) 174/70  Pulse:  97 (!) 102 100  Resp:  14 16 18   Temp:  98.7 F (37.1 C) 98.5 F (36.9 C) 98 F (36.7 C)  TempSrc:  Oral Oral Oral  SpO2:  97% 96% 98%  Weight: 61.2 kg     Height: 5\' 6"  (1.676 m)       Physical Exam Vitals and nursing note reviewed.  Constitutional:      General: She is not in acute distress.    Appearance: She is well-developed.  HENT:     Head: Normocephalic and atraumatic.  Eyes:  Conjunctiva/sclera: Conjunctivae normal.  Cardiovascular:     Rate and Rhythm: Normal rate and regular rhythm.     Heart sounds: No murmur heard. Pulmonary:     Effort: Pulmonary effort is normal. No respiratory distress.     Breath sounds: Normal breath sounds.  Abdominal:     Palpations: Abdomen is soft.     Tenderness: There is no abdominal tenderness.  Musculoskeletal:        General: No swelling.     Cervical back: Neck supple.  Skin:    General:  Skin is warm and dry.     Capillary Refill: Capillary refill takes less than 2 seconds.  Neurological:     Mental Status: She is alert.  Psychiatric:        Mood and Affect: Mood normal.     Medical History: Allergies  Allergen Reactions   Crestor [Rosuvastatin] Other (See Comments)    MYALGIAS   Ezetimibe-Simvastatin Other (See Comments)    MYALGIAS   Pravastatin Other (See Comments)    Myalgias   Diazepam     Balance difficulty.     Lyrica [Pregabalin]     swelling   Praluent [Alirocumab]     myalgias   Repatha [Evolocumab]     Hoarse voice   Acetaminophen Anxiety and Other (See Comments)    REACTION: jittery with plain tylenol, but tolerated tylenol/benadryl combination   Carvedilol Other (See Comments)    nightmares   Gabapentin Diarrhea   Ibuprofen Other (See Comments)    REACTION: GI upset at high doses   Insulin Glargine Rash    Rash   Levemir [Insulin Detemir] Rash   Macrobid [Nitrofurantoin Macrocrystal] Nausea And Vomiting and Other (See Comments)    ACHES   Metformin Nausea And Vomiting    REACTION: GI upset   Metoprolol Tartrate Other (See Comments)    REACTION: nightmares   Naproxen Nausea And Vomiting    REACTION: GI upset   Past Medical History:  Diagnosis Date   Anemia 05/1985   Carotid artery disease (HCC)    a. 02/2015 - 1-39% bilaterally.   Chronic back pain    Concussion    Coronary artery disease    a. s/p DES to [facility] 01/2015.   DDD (degenerative disc disease), cervical    DDD (degenerative disc disease), lumbar    Fracture of lower leg 07/2001   Right   GERD (gastroesophageal reflux disease)    Hyperlipidemia    Hypertension    Multinodular thyroid    Pneumonia ?1990's X 1   PSVT (paroxysmal supraventricular tachycardia) (HCC)    a. s/p catheter ablation of a parahisian atrial tachycardia which was successfully ablated from the non-coronary cusp of the aortic root 06/2014.   RBBB with left anterior fascicular block    Renal cyst  07/10/2006   bilateral   Right knee pain 02/2010   Injected - Dr. Shelle Iron   Sinus pause    a. By event monitoring 09/2014 - BB stopped at that time.   Statin intolerance    Stroke [facility])    Syncope and collapse 07/2001   "that's when I broke my leg"   Type II diabetes mellitus (HCC) 2002    Past Surgical History:  Procedure Laterality Date   ANTERIOR CERVICAL DECOMP/DISCECTOMY FUSION  10/03/2005   C4/5; C5/6  "it's got a plate in there"   ANTERIOR LAT LUMBAR FUSION Left 02/04/2018   Procedure: Lumbar two-three Lumbar three-four Lumbar four-five  Anterolateral decompression/percutaneous posterior arthrodesis, Mazor;  Surgeon: Barnett Abu, MD;  Location[facility];  Service: Neurosurgery;  Laterality: Left;   APPLICATION OF ROBOTIC ASSISTANCE FOR SPINAL PROCEDURE N/A 02/04/2018   Procedure: APPLICATION OF ROBOTIC ASSISTANCE FOR SPINAL PROCEDURE;  Surgeon: Barnett Abu, MD;  Location: [facility] OR;  Service: Neurosurgery;  Laterality: N/A;   BACK SURGERY     BREAST BIOPSY Bilateral 1968 (multiple)   "all benign"   BREAST LUMPECTOMY Bilateral 1968   CARDIAC CATHETERIZATION N/A 11/18/2014   Procedure: Left Heart Cath and Coronary Angiography;  Surgeon: Peter M Swaziland, MD;  Location: [facility] INVASIVE CV LAB;  Service: Cardiovascular;  Laterality: N/A;   CARDIAC CATHETERIZATION N/A 01/22/2015   Procedure: Coronary Stent Intervention;  Surgeon: Peter M Swaziland, MD;  Location: [facility] INVASIVE CV LAB;  Service: Cardiovascular;  Laterality: N/A;   CARDIAC CATHETERIZATION N/A 06/10/2015   Procedure: Left Heart Cath and Coronary Angiography;  Surgeon: Peter M Swaziland, MD;  Location: [facility] INVASIVE CV LAB;  Service: Cardiovascular;  Laterality: N/A;   CATARACT EXTRACTION W/ INTRAOCULAR LENS  IMPLANT, BILATERAL Bilateral 12/2010   CORONARY ANGIOPLASTY     ESOPHAGOGASTRODUODENOSCOPY  08/14/2005   gastropathy biopsy, negative   LAPAROSCOPIC CHOLECYSTECTOMY  08/1992   LOOP RECORDER INSERTION N/A 08/19/2020   Procedure: LOOP RECORDER  INSERTION;  Surgeon: Marinus Maw, MD;  Location: [facility] INVASIVE CV LAB;  Service: Cardiovascular;  Laterality: N/A;   LUMBAR PERCUTANEOUS PEDICLE SCREW 3 LEVEL N/A 02/04/2018   Procedure: LUMBAR PERCUTANEOUS PEDICLE SCREW LUMBAR TWO - LUMBAR FIVE;  Surgeon: Barnett Abu, MD;  Location: [facility] OR;  Service: Neurosurgery;  Laterality: N/A;   POSTERIOR LAMINECTOMY / DECOMPRESSION LUMBAR SPINE  09/2001   due to herniated disc   SUPRAVENTRICULAR TACHYCARDIA ABLATION N/A 07/06/2014   Procedure: SUPRAVENTRICULAR TACHYCARDIA ABLATION;  Surgeon: Marinus Maw, MD;  Location: [facility] CATH LAB;  Service: Cardiovascular;  Laterality: N/A;   VAGINAL HYSTERECTOMY  1968   spotting   Family History  Problem Relation Age of Onset   Hypertension Mother    Heart disease Mother    Diabetes Sister    Myasthenia gravis Sister    Diabetes Brother        1/2 juvenile; foot ulcer; periph. neuropathy   Heart disease Maternal Grandfather        MI   Hypertension Maternal Grandfather    Depression Paternal Aunt    Hypertension Maternal Grandmother    Diabetes Maternal Grandmother    Stroke Maternal Grandmother    Hypertension Paternal Grandmother    Diabetes Paternal Grandmother    Hypertension Paternal Grandfather    Diabetes Paternal Grandfather    Breast cancer Maternal Aunt    Colon cancer Neg Hx     Social History   Tobacco Use   Smoking status: Never   Smokeless tobacco: Never  Vaping Use   Vaping status: Never Used  Substance Use Topics   Alcohol use: No    Alcohol/week: 0.0 standard drinks of alcohol   Drug use: No    Procedures   If procedures were preformed on this patient, they are listed below:  Procedures   -------- HPI and MDM generated using voice dictation software and may contain dictation errors. Please contact me for any clarification or with any questions.   Cephus Slater, MD Emergency Medicine PGY-2    Caron Presume, MD 07/09/23 Leta Speller    Gerhard Munch,  MD 07/10/23 0000

## 2023-07-09 NOTE — ED Notes (Signed)
 Maria [name]  [phone], called for an update

## 2023-07-09 NOTE — ED Triage Notes (Signed)
 Pt bib ems from home c/o cp that started 2 days ago. N/V and weakness. Pt states it is sharp and radiate to back. Pt pain has stopped. No medications given en route.    BP 127/71 HR 95 RA 96 CBG 222

## 2023-07-09 NOTE — ED Provider Triage Note (Signed)
 Emergency Medicine Provider Triage Evaluation Note  Maria [name] , a 83 y.o. female  was evaluated in triage.  Pt complains of vomiting x 2 days with epigastric pain going up into her chest.  Symptoms are not exertional.  No associated fever.  Review of Systems  Positive: No dizziness or diaphoresis Negative: No urinary symptoms  Physical Exam  BP 139/78 (BP Location: Left Arm)   Pulse 97   Temp 98.7 F (37.1 C) (Oral)   Resp 14   Ht 1.676 m (5\' 6" )   Wt 61.2 kg   SpO2 97%   BMI 21.79 kg/m  Gen:   Awake, no distress   Resp:  Normal effort  MSK:   Moves extremities without difficulty  Other:    Medical Decision Making  Medically screening exam initiated at 4:18 PM.  Appropriate orders placed.  Maria [name] was informed that the remainder of the evaluation will be completed by another provider, this initial triage assessment does not replace that evaluation, and the importance of remaining in the ED until their evaluation is complete.     Lorre Nick, MD 07/09/23 [phone]

## 2023-07-10 DIAGNOSIS — D72829 Elevated white blood cell count, unspecified: Secondary | ICD-10-CM | POA: Diagnosis present

## 2023-07-10 DIAGNOSIS — Z955 Presence of coronary angioplasty implant and graft: Secondary | ICD-10-CM | POA: Diagnosis not present

## 2023-07-10 DIAGNOSIS — E785 Hyperlipidemia, unspecified: Secondary | ICD-10-CM | POA: Diagnosis present

## 2023-07-10 DIAGNOSIS — E042 Nontoxic multinodular goiter: Secondary | ICD-10-CM | POA: Diagnosis present

## 2023-07-10 DIAGNOSIS — K2210 Ulcer of esophagus without bleeding: Secondary | ICD-10-CM | POA: Diagnosis present

## 2023-07-10 DIAGNOSIS — Z8249 Family history of ischemic heart disease and other diseases of the circulatory system: Secondary | ICD-10-CM | POA: Diagnosis not present

## 2023-07-10 DIAGNOSIS — R1310 Dysphagia, unspecified: Secondary | ICD-10-CM | POA: Diagnosis present

## 2023-07-10 DIAGNOSIS — Z7984 Long term (current) use of oral hypoglycemic drugs: Secondary | ICD-10-CM | POA: Diagnosis not present

## 2023-07-10 DIAGNOSIS — Z823 Family history of stroke: Secondary | ICD-10-CM | POA: Diagnosis not present

## 2023-07-10 DIAGNOSIS — Z981 Arthrodesis status: Secondary | ICD-10-CM | POA: Diagnosis not present

## 2023-07-10 DIAGNOSIS — I10 Essential (primary) hypertension: Secondary | ICD-10-CM

## 2023-07-10 DIAGNOSIS — R112 Nausea with vomiting, unspecified: Secondary | ICD-10-CM | POA: Diagnosis not present

## 2023-07-10 DIAGNOSIS — Z79899 Other long term (current) drug therapy: Secondary | ICD-10-CM | POA: Diagnosis not present

## 2023-07-10 DIAGNOSIS — Z1152 Encounter for screening for COVID-19: Secondary | ICD-10-CM | POA: Diagnosis not present

## 2023-07-10 DIAGNOSIS — E119 Type 2 diabetes mellitus without complications: Secondary | ICD-10-CM | POA: Diagnosis present

## 2023-07-10 DIAGNOSIS — Z9842 Cataract extraction status, left eye: Secondary | ICD-10-CM | POA: Diagnosis not present

## 2023-07-10 DIAGNOSIS — Z634 Disappearance and death of family member: Secondary | ICD-10-CM | POA: Diagnosis not present

## 2023-07-10 DIAGNOSIS — K219 Gastro-esophageal reflux disease without esophagitis: Secondary | ICD-10-CM | POA: Diagnosis present

## 2023-07-10 DIAGNOSIS — I2510 Atherosclerotic heart disease of native coronary artery without angina pectoris: Secondary | ICD-10-CM | POA: Diagnosis present

## 2023-07-10 DIAGNOSIS — Z886 Allergy status to analgesic agent status: Secondary | ICD-10-CM | POA: Diagnosis not present

## 2023-07-10 DIAGNOSIS — Z833 Family history of diabetes mellitus: Secondary | ICD-10-CM | POA: Diagnosis not present

## 2023-07-10 DIAGNOSIS — Z888 Allergy status to other drugs, medicaments and biological substances status: Secondary | ICD-10-CM | POA: Diagnosis not present

## 2023-07-10 DIAGNOSIS — Z8673 Personal history of transient ischemic attack (TIA), and cerebral infarction without residual deficits: Secondary | ICD-10-CM | POA: Diagnosis not present

## 2023-07-10 DIAGNOSIS — Z961 Presence of intraocular lens: Secondary | ICD-10-CM | POA: Diagnosis present

## 2023-07-10 DIAGNOSIS — Z90710 Acquired absence of both cervix and uterus: Secondary | ICD-10-CM | POA: Diagnosis not present

## 2023-07-10 DIAGNOSIS — Z9841 Cataract extraction status, right eye: Secondary | ICD-10-CM | POA: Diagnosis not present

## 2023-07-10 LAB — BASIC METABOLIC PANEL
Anion gap: 10 (ref 5–15)
BUN: 26 mg/dL — ABNORMAL HIGH (ref 8–23)
CO2: 24 mmol/L (ref 22–32)
Calcium: 9.2 mg/dL (ref 8.9–10.3)
Chloride: 104 mmol/L (ref 98–111)
Creatinine, Ser: 1 mg/dL (ref 0.44–1.00)
GFR, Estimated: 56 mL/min — ABNORMAL LOW (ref >60)
Glucose, Bld: 154 mg/dL — ABNORMAL HIGH (ref 70–99)
Potassium: 4.4 mmol/L (ref 3.5–5.1)
Sodium: 138 mmol/L (ref 135–145)

## 2023-07-10 MED ORDER — PANTOPRAZOLE SODIUM 40 MG IV SOLR
40.0000 mg | INTRAVENOUS | Status: DC
  Administered 2023-07-10 – 2023-07-11 (×2): 40 mg via INTRAVENOUS
  Filled 2023-07-10 (×2): qty 10

## 2023-07-10 MED ORDER — BISACODYL 5 MG PO TBEC
10.0000 mg | DELAYED_RELEASE_TABLET | Freq: Once | ORAL | Status: DC
  Filled 2023-07-10: qty 2

## 2023-07-10 MED ORDER — HYDROMORPHONE HCL 1 MG/ML IJ SOLN
0.5000 mg | Freq: Once | INTRAMUSCULAR | Status: AC
  Administered 2023-07-10: 0.5 mg via INTRAVENOUS
  Filled 2023-07-10: qty 1

## 2023-07-10 MED ORDER — ONDANSETRON HCL 4 MG/2ML IJ SOLN: 4.0000 mg | Freq: Four times a day (QID) | INTRAMUSCULAR | Status: DC | PRN

## 2023-07-10 MED ORDER — HYDROMORPHONE HCL 1 MG/ML IJ SOLN
0.5000 mg | INTRAMUSCULAR | Status: DC | PRN
  Administered 2023-07-10 – 2023-07-12 (×5): 0.5 mg via INTRAVENOUS
  Filled 2023-07-10 (×2): qty 0.5
  Filled 2023-07-10: qty 1
  Filled 2023-07-10 (×2): qty 0.5

## 2023-07-10 MED ORDER — GLYCERIN (LAXATIVE) 2 G RE SUPP: 1.0000 | Freq: Once | RECTAL | Status: DC

## 2023-07-10 MED ORDER — OXYCODONE HCL 5 MG PO TABS
5.0000 mg | ORAL_TABLET | Freq: Four times a day (QID) | ORAL | Status: DC | PRN
  Administered 2023-07-10 – 2023-07-13 (×8): 5 mg via ORAL
  Filled 2023-07-10 (×8): qty 1

## 2023-07-10 MED ORDER — ONDANSETRON HCL 4 MG PO TABS: 4.0000 mg | ORAL_TABLET | Freq: Four times a day (QID) | ORAL | Status: DC | PRN

## 2023-07-10 MED ORDER — LACTATED RINGERS IV SOLN: INTRAVENOUS | Status: AC

## 2023-07-10 NOTE — Consult Note (Signed)
 [facility] Consultation Note  Referring Provider: [facility] Primary Care Physician:  Joaquim Nam, MD Primary Gastroenterologist:  [facility] (previously Dr. Matthias Hughs)  Reason for Consultation:  odynophagia, dysphagia  HPI: Maria [name] is a 83 y.o. female 3 days post-prandial chest pain and nausea and vomiting.  Unable to eat for past 2-3 days.  No prior similar episodes.  No blood in stools or change in bowel habits.  EGD 2013 Dr. Matthias Hughs for weight loss and abdominal pain was unrevealing.  NSAIDs intermittently.  Denies blood thinners.   Past Medical History:  Diagnosis Date   Anemia 05/1985   Carotid artery disease (HCC)    a. 02/2015 - 1-39% bilaterally.   Chronic back pain    Concussion    Coronary artery disease    a. s/p DES to [facility] 01/2015.   DDD (degenerative disc disease), cervical    DDD (degenerative disc disease), lumbar    Fracture of lower leg 07/2001   Right   GERD (gastroesophageal reflux disease)    Hyperlipidemia    Hypertension    Multinodular thyroid    Pneumonia ?1990's X 1   PSVT (paroxysmal supraventricular tachycardia) (HCC)    a. s/p catheter ablation of a parahisian atrial tachycardia which was successfully ablated from the non-coronary cusp of the aortic root 06/2014.   RBBB with left anterior fascicular block    Renal cyst 07/10/2006   bilateral   Right knee pain 02/2010   Injected - Dr. Shelle Iron   Sinus pause    a. By event monitoring 09/2014 - BB stopped at that time.   Statin intolerance    Stroke [facility])    Syncope and collapse 07/2001   "that's when I broke my leg"   Type II diabetes mellitus (HCC) 2002    Past Surgical History:  Procedure Laterality Date   ANTERIOR CERVICAL DECOMP/DISCECTOMY FUSION  10/03/2005   C4/5; C5/6  "it's got a plate in there"   ANTERIOR LAT LUMBAR FUSION Left 02/04/2018   Procedure: Lumbar two-three Lumbar three-four Lumbar four-five  Anterolateral decompression/percutaneous posterior  arthrodesis, Mazor;  Surgeon: Barnett Abu, MD;  Location: [facility] OR;  Service: Neurosurgery;  Laterality: Left;   APPLICATION OF ROBOTIC ASSISTANCE FOR SPINAL PROCEDURE N/A 02/04/2018   Procedure: APPLICATION OF ROBOTIC ASSISTANCE FOR SPINAL PROCEDURE;  Surgeon: Barnett Abu, MD;  Location: [facility] OR;  Service: Neurosurgery;  Laterality: N/A;   BACK SURGERY     BREAST BIOPSY Bilateral 1968 (multiple)   "all benign"   BREAST LUMPECTOMY Bilateral 1968   CARDIAC CATHETERIZATION N/A 11/18/2014   Procedure: Left Heart Cath and Coronary Angiography;  Surgeon: Peter M Swaziland, MD;  Location: [facility] INVASIVE CV LAB;  Service: Cardiovascular;  Laterality: N/A;   CARDIAC CATHETERIZATION N/A 01/22/2015   Procedure: Coronary Stent Intervention;  Surgeon: Peter M Swaziland, MD;  Location: [facility] INVASIVE CV LAB;  Service: Cardiovascular;  Laterality: N/A;   CARDIAC CATHETERIZATION N/A 06/10/2015   Procedure: Left Heart Cath and Coronary Angiography;  Surgeon: Peter M Swaziland, MD;  Location: [facility] INVASIVE CV LAB;  Service: Cardiovascular;  Laterality: N/A;   CATARACT EXTRACTION W/ INTRAOCULAR LENS  IMPLANT, BILATERAL Bilateral 12/2010   CORONARY ANGIOPLASTY     ESOPHAGOGASTRODUODENOSCOPY  08/14/2005   gastropathy biopsy, negative   LAPAROSCOPIC CHOLECYSTECTOMY  08/1992   LOOP RECORDER INSERTION N/A 08/19/2020   Procedure: LOOP RECORDER INSERTION;  Surgeon: Marinus Maw, MD;  Location: [facility] INVASIVE CV LAB;  Service: Cardiovascular;  Laterality: N/A;   LUMBAR PERCUTANEOUS PEDICLE  SCREW 3 LEVEL N/A 02/04/2018   Procedure: LUMBAR PERCUTANEOUS PEDICLE SCREW LUMBAR TWO - LUMBAR FIVE;  Surgeon: Barnett Abu, MD;  Location: [facility] OR;  Service: Neurosurgery;  Laterality: N/A;   POSTERIOR LAMINECTOMY / DECOMPRESSION LUMBAR SPINE  09/2001   due to herniated disc   SUPRAVENTRICULAR TACHYCARDIA ABLATION N/A 07/06/2014   Procedure: SUPRAVENTRICULAR TACHYCARDIA ABLATION;  Surgeon: Marinus Maw, MD;  Location: [facility] CATH LAB;  Service: Cardiovascular;   Laterality: N/A;   VAGINAL HYSTERECTOMY  1968   spotting    Prior to Admission medications   Medication Sig Start Date End Date Taking? Authorizing Provider  aspirin EC 81 MG tablet Take 81 mg by mouth as needed for mild pain (pain score 1-3) or fever. Swallow whole.   Yes [provider]  Bempedoic Acid-Ezetimibe (NEXLIZET) 180-10 MG TABS Take 1 tablet by mouth daily. 06/22/23  Yes Wendall Stade, MD  Cholecalciferol (VITAMIN D3) 50 MCG (2000 UT) capsule Take 1 capsule (2,000 Units total) by mouth daily. 02/10/22  Yes Joaquim Nam, MD  cyanocobalamin (VITAMIN B12) 1000 MCG/ML injection 1000 mcg injected IM every 14 days. 02/02/23  Yes Joaquim Nam, MD  glyBURIDE (DIABETA) 5 MG tablet Take 0.5 tablets (2.5 mg total) by mouth daily with breakfast. 05/14/23  Yes Joaquim Nam, MD  ibuprofen (ADVIL) 200 MG tablet Take 2-3 tablets (400-600 mg total) by mouth 2 (two) times daily as needed for moderate pain (with food). Patient taking differently: Take 400-600 mg by mouth as needed for moderate pain (pain score 4-6) or mild pain (pain score 1-3) (with food). 01/19/22  Yes Joaquim Nam, MD  losartan (COZAAR) 25 MG tablet Take 1 tablet (25 mg total) by mouth daily. 02/19/23  Yes Wendall Stade, MD  omeprazole (PRILOSEC) 20 MG capsule Take 1 capsule (20 mg total) by mouth 2 (two) times daily as needed (for acid reflux/indigestion.). 05/22/22  Yes Joaquim Nam, MD  nitroGLYCERIN (NITROSTAT) 0.4 MG SL tablet PLACE 1 TABLET UNDER THE TONGUE EVERY 5 MINUTES AS NEEDED FOR CHEST PAIN FOR UP TO 3 DOSES Patient not taking: Reported on 07/10/2023 11/10/20   Wendall Stade, MD    Current Facility-Administered Medications  Medication Dose Route Frequency Provider Last Rate Last Admin   bisacodyl (DULCOLAX) EC tablet 10 mg  10 mg Oral Once Buena Irish, MD       lactated ringers infusion   Intravenous Continuous Buena Irish, MD 100 mL/hr at 07/10/23 0501 New Bag at 07/10/23 0501    ondansetron (ZOFRAN) tablet 4 mg  4 mg Oral Q6H PRN Buena Irish, MD       Or   ondansetron [facility]) injection 4 mg  4 mg Intravenous Q6H PRN Buena Irish, MD       pantoprazole (PROTONIX) injection 40 mg  40 mg Intravenous Q24H Buena Irish, MD   40 mg at 07/10/23 0501   Current Outpatient Medications  Medication Sig Dispense Refill   aspirin EC 81 MG tablet Take 81 mg by mouth as needed for mild pain (pain score 1-3) or fever. Swallow whole.     Bempedoic Acid-Ezetimibe (NEXLIZET) 180-10 MG TABS Take 1 tablet by mouth daily. 90 tablet 2   Cholecalciferol (VITAMIN D3) 50 MCG (2000 UT) capsule Take 1 capsule (2,000 Units total) by mouth daily.     cyanocobalamin (VITAMIN B12) 1000 MCG/ML injection 1000 mcg injected IM every 14 days. 6 mL 3   glyBURIDE (DIABETA) 5 MG tablet Take 0.5 tablets (2.5 mg  total) by mouth daily with breakfast.     ibuprofen (ADVIL) 200 MG tablet Take 2-3 tablets (400-600 mg total) by mouth 2 (two) times daily as needed for moderate pain (with food). (Patient taking differently: Take 400-600 mg by mouth as needed for moderate pain (pain score 4-6) or mild pain (pain score 1-3) (with food).)     losartan (COZAAR) 25 MG tablet Take 1 tablet (25 mg total) by mouth daily. 90 tablet 3   omeprazole (PRILOSEC) 20 MG capsule Take 1 capsule (20 mg total) by mouth 2 (two) times daily as needed (for acid reflux/indigestion.). 180 capsule 3   nitroGLYCERIN (NITROSTAT) 0.4 MG SL tablet PLACE 1 TABLET UNDER THE TONGUE EVERY 5 MINUTES AS NEEDED FOR CHEST PAIN FOR UP TO 3 DOSES (Patient not taking: Reported on 07/10/2023) 25 tablet 7    Allergies as of 07/09/2023 - Review Complete 07/09/2023  Allergen Reaction Noted   Crestor [rosuvastatin] Other (See Comments) 12/18/2013   Ezetimibe-simvastatin Other (See Comments) 02/25/2010   Pravastatin Other (See Comments) 01/04/2016   Diazepam  10/06/2019   Lyrica [pregabalin]  05/16/2018   Praluent [alirocumab]  09/03/2018    Repatha [evolocumab]  01/10/2019   Acetaminophen Anxiety and Other (See Comments) 06/13/2010   Carvedilol Other (See Comments) 05/17/2010   Gabapentin Diarrhea 10/11/2017   Ibuprofen Other (See Comments) 12/16/2009   Insulin glargine Rash 02/13/2011   Levemir [insulin detemir] Rash 02/14/2012   Macrobid [nitrofurantoin macrocrystal] Nausea And Vomiting and Other (See Comments) July 17, 2012   Metformin Nausea And Vomiting 02/25/2010   Metoprolol tartrate Other (See Comments) 05/17/2010   Naproxen Nausea And Vomiting 12/16/2009    Family History  Problem Relation Age of Onset   Hypertension Mother    Heart disease Mother    Diabetes Sister    Myasthenia gravis Sister    Diabetes Brother        1/2 juvenile; foot ulcer; periph. neuropathy   Heart disease Maternal Grandfather        MI   Hypertension Maternal Grandfather    Depression Paternal Aunt    Hypertension Maternal Grandmother    Diabetes Maternal Grandmother    Stroke Maternal Grandmother    Hypertension Paternal Grandmother    Diabetes Paternal Grandmother    Hypertension Paternal Grandfather    Diabetes Paternal Grandfather    Breast cancer Maternal Aunt    Colon cancer Neg Hx     Social History   Socioeconomic History   Marital status: Married    Spouse name: Not on file   Number of children: 2   Years of education: Not on file   Highest education level: Not on file  Occupational History   Occupation: Retired; Hotel manager, [employer]    Employer: RETIRED    Comment: Works a couple days a week  Tobacco Use   Smoking status: Never   Smokeless tobacco: Never  Vaping Use   Vaping status: Never Used  Substance and Sexual Activity   Alcohol use: No    Alcohol/week: 0.0 standard drinks of alcohol   Drug use: No   Sexual activity: Yes  Other Topics Concern   Not on file  Social History Narrative   Her son died 17-Jul-2021.        Widowed July 17, 2009 after 52 years of marriage (husband had C.diff)    Remarried 07/17/2013   Right handed   One story home   Occasional caffeine   Social Drivers of Corporate investment banker Strain: Low Risk  (  08/02/2022)   Overall Financial Resource Strain (CARDIA)    Difficulty of Paying Living Expenses: Not hard at all  Food Insecurity: No Food Insecurity (08/07/2022)   Hunger Vital Sign    Worried About Running Out of Food in the Last Year: Never true    Ran Out of Food in the Last Year: Never true  Transportation Needs: No Transportation Needs (08/07/2022)   PRAPARE - Administrator, Civil Service (Medical): No    Lack of Transportation (Non-Medical): No  Physical Activity: Insufficiently Active (08/02/2022)   Exercise Vital Sign    Days of Exercise per Week: 2 days    Minutes of Exercise per Session: 30 min  Stress: No Stress Concern Present (08/02/2022)   [employer] of Occupational Health - Occupational Stress Questionnaire    Feeling of Stress : Not at all  Social Connections: Moderately Integrated (08/02/2022)   Social Connection and Isolation Panel [NHANES]    Frequency of Communication with Friends and Family: More than three times a week    Frequency of Social Gatherings with Friends and Family: More than three times a week    Attends Religious Services: More than 4 times per year    Active Member of [employer] or Organizations: No    Attends Banker Meetings: Never    Marital Status: Married  Catering manager Violence: Not At Risk (08/02/2022)   Humiliation, Afraid, Rape, and Kick questionnaire    Fear of Current or Ex-Partner: No    Emotionally Abused: No    Physically Abused: No    Sexually Abused: No    Review of Systems: As per HPI, all others negative  Physical Exam: Vital signs in last 24 hours: Temp:  [97.5 F (36.4 C)-98.7 F (37.1 C)] 97.6 F (36.4 C) (02/25 0745) Pulse Rate:  [80-104] 80 (02/25 0745) Resp:  [14-18] 16 (02/25 0745) BP: (110-181)/(70-87) 147/73 (02/25 0745) SpO2:  [93 %-100 %] 98 %  (02/25 0745) Weight:  [61.2 kg] 61.2 kg (02/24 1508)   General:   Alert,  Well-developed, well-nourished, pleasant and cooperative in NAD Head:  Normocephalic and atraumatic. Eyes:  Sclera clear, no icterus.   Conjunctiva pink. Ears:  Normal auditory acuity. Nose:  No deformity, discharge,  or lesions. Mouth:  No deformity or lesions.  Oropharynx pink but dry Neck:  Supple; no masses or thyromegaly. Lungs:  No respiratory visible distress Abdomen:  Soft, nontender and nondistended. No masses, hepatosplenomegaly or hernias noted. Normal bowel sounds, without guarding, and without rebound.     Msk:  Symmetrical without gross deformities. Normal posture. Pulses:  Normal pulses noted. Extremities:  Without clubbing or edema. Neurologic:  Alert and  oriented x4; diffusely weak, otherwise grossly normal neurologically. Skin:  Intact without significant lesions or rashes. Psych:  Alert and cooperative. Normal mood and affect.   Lab Results: Recent Labs    07/09/23 1631  WBC 13.6*  HGB 14.6  HCT 43.5  PLT 433*   BMET Recent Labs    07/09/23 1631 07/10/23 0500  NA 138 138  K 4.5 4.4  CL 103 104  CO2 19* 24  GLUCOSE 195* 154*  BUN 26* 26*  CREATININE 1.09* 1.00  CALCIUM 9.8 9.2   LFT Recent Labs    07/09/23 1631  PROT 7.3  ALBUMIN 3.9  AST 19  ALT 15  ALKPHOS 72  BILITOT 0.9   PT/INR No results for input(s): "LABPROT", "INR" in the last 72 hours.  Studies/Results: CT Angio Chest PE  W and/or Wo Contrast Result Date: 07/09/2023 CLINICAL DATA:  Chest pain radiating to back. EXAM: CT ANGIOGRAPHY CHEST WITH CONTRAST TECHNIQUE: Multidetector CT imaging of the chest was performed using the standard protocol during bolus administration of intravenous contrast. Multiplanar CT image reconstructions and MIPs were obtained to evaluate the vascular anatomy. RADIATION DOSE REDUCTION: This exam was performed according to the departmental dose-optimization program which includes  automated exposure control, adjustment of the mA and/or kV according to patient size and/or use of iterative reconstruction technique. CONTRAST:  57mL OMNIPAQUE IOHEXOL 350 MG/ML SOLN COMPARISON:  None Available. FINDINGS: Cardiovascular: No filling defects in the pulmonary arteries to suggest pulmonary emboli. Heart is normal size. Three-vessel coronary artery disease. No evidence of aortic aneurysm or dissection. Scattered aortic calcifications. Mediastinum/Nodes: No mediastinal, hilar, or axillary adenopathy. Trachea and esophagus are unremarkable. Thyroid unremarkable. Lungs/Pleura: Mosaic attenuation within the lungs compatible with areas of air trapping. No confluent opacities or effusions. Upper Abdomen: No acute findings Musculoskeletal: Chest wall soft tissues are unremarkable. No acute bony abnormality. Review of the MIP images confirms the above findings. IMPRESSION: No evidence of aortic aneurysm or XX chin. No evidence of pulmonary embolus. Coronary artery disease. Mosaic attenuation within the lungs compatible with areas of air trapping. No areas of consolidation. Aortic Atherosclerosis (ICD10-I70.0). Electronically Signed   By: Charlett Nose M.D.   On: 07/09/2023 23:52   DG Chest Portable 1 View Result Date: 07/09/2023 CLINICAL DATA:  Chest pain starting 2 days ago. Nausea, vomiting, and weakness. EXAM: PORTABLE CHEST 1 VIEW COMPARISON:  11/13/2014 FINDINGS: Shallow inspiration. Heart size and pulmonary vascularity are normal for technique. Lungs are clear. No pleural effusion or pneumothorax. Mediastinal contours appear intact. Calcification of the aorta. Postoperative changes in the cervical spine. Lumbar scoliosis convex towards the right. A loop recorder is present. IMPRESSION: Shallow inspiration.  No evidence of active pulmonary disease. Electronically Signed   By: Burman Nieves M.D.   On: 07/09/2023 23:03    Impression:   Nausea/vomiting.  Unable to eat x 2-3 days.  No  sialorrhea. Chest pain with eating.  Plan:   IV PPI.  IV fluids.  Antiemetics as needed. Sips clears as tolerated. Endoscopy tomorrow for further evaluation. Risks (bleeding, infection, bowel perforation that could require surgery, sedation-related changes in cardiopulmonary systems), benefits (identification and possible treatment of source of symptoms, exclusion of certain causes of symptoms), and alternatives (watchful waiting, radiographic imaging studies, empiric medical treatment) of upper endoscopy (EGD) were explained to patient/family in detail and patient wishes to proceed.  [facility] will follow.   LOS: 0 days   Kayliana Codd M  07/10/2023, 11:30 AM  Cell [phone] If no answer or after 5 PM call [phone]

## 2023-07-10 NOTE — Progress Notes (Signed)
 Patient admitted after midnight, please see H&P.  Here with chest discomfort after eating.  Patient has seen [facility] in the past so will consult them for consideration of barium swallow/EGD.  Seen in the ER hallway  Marlin Canary DO

## 2023-07-10 NOTE — H&P (View-Only) (Signed)
 [facility] Consultation Note  Referring Provider: [facility] Primary Care Physician:  Joaquim Nam, MD Primary Gastroenterologist:  [facility] (previously Dr. Matthias Hughs)  Reason for Consultation:  odynophagia, dysphagia  HPI: Maria [name] is a 83 y.o. female 3 days post-prandial chest pain and nausea and vomiting.  Unable to eat for past 2-3 days.  No prior similar episodes.  No blood in stools or change in bowel habits.  EGD 2013 Dr. Matthias Hughs for weight loss and abdominal pain was unrevealing.  NSAIDs intermittently.  Denies blood thinners.   Past Medical History:  Diagnosis Date   Anemia 05/1985   Carotid artery disease (HCC)    a. 02/2015 - 1-39% bilaterally.   Chronic back pain    Concussion    Coronary artery disease    a. s/p DES to [facility] 01/2015.   DDD (degenerative disc disease), cervical    DDD (degenerative disc disease), lumbar    Fracture of lower leg 07/2001   Right   GERD (gastroesophageal reflux disease)    Hyperlipidemia    Hypertension    Multinodular thyroid    Pneumonia ?1990's X 1   PSVT (paroxysmal supraventricular tachycardia) (HCC)    a. s/p catheter ablation of a parahisian atrial tachycardia which was successfully ablated from the non-coronary cusp of the aortic root 06/2014.   RBBB with left anterior fascicular block    Renal cyst 07/10/2006   bilateral   Right knee pain 02/2010   Injected - Dr. Shelle Iron   Sinus pause    a. By event monitoring 09/2014 - BB stopped at that time.   Statin intolerance    Stroke [facility])    Syncope and collapse 07/2001   "that's when I broke my leg"   Type II diabetes mellitus (HCC) 2002    Past Surgical History:  Procedure Laterality Date   ANTERIOR CERVICAL DECOMP/DISCECTOMY FUSION  10/03/2005   C4/5; C5/6  "it's got a plate in there"   ANTERIOR LAT LUMBAR FUSION Left 02/04/2018   Procedure: Lumbar two-three Lumbar three-four Lumbar four-five  Anterolateral decompression/percutaneous posterior  arthrodesis, Mazor;  Surgeon: Barnett Abu, MD;  Location: [facility] OR;  Service: Neurosurgery;  Laterality: Left;   APPLICATION OF ROBOTIC ASSISTANCE FOR SPINAL PROCEDURE N/A 02/04/2018   Procedure: APPLICATION OF ROBOTIC ASSISTANCE FOR SPINAL PROCEDURE;  Surgeon: Barnett Abu, MD;  Location: [facility] OR;  Service: Neurosurgery;  Laterality: N/A;   BACK SURGERY     BREAST BIOPSY Bilateral 1968 (multiple)   "all benign"   BREAST LUMPECTOMY Bilateral 1968   CARDIAC CATHETERIZATION N/A 11/18/2014   Procedure: Left Heart Cath and Coronary Angiography;  Surgeon: Peter M Swaziland, MD;  Location: [facility] INVASIVE CV LAB;  Service: Cardiovascular;  Laterality: N/A;   CARDIAC CATHETERIZATION N/A 01/22/2015   Procedure: Coronary Stent Intervention;  Surgeon: Peter M Swaziland, MD;  Location: [facility] INVASIVE CV LAB;  Service: Cardiovascular;  Laterality: N/A;   CARDIAC CATHETERIZATION N/A 06/10/2015   Procedure: Left Heart Cath and Coronary Angiography;  Surgeon: Peter M Swaziland, MD;  Location: [facility] INVASIVE CV LAB;  Service: Cardiovascular;  Laterality: N/A;   CATARACT EXTRACTION W/ INTRAOCULAR LENS  IMPLANT, BILATERAL Bilateral 12/2010   CORONARY ANGIOPLASTY     ESOPHAGOGASTRODUODENOSCOPY  08/14/2005   gastropathy biopsy, negative   LAPAROSCOPIC CHOLECYSTECTOMY  08/1992   LOOP RECORDER INSERTION N/A 08/19/2020   Procedure: LOOP RECORDER INSERTION;  Surgeon: Marinus Maw, MD;  Location: [facility] INVASIVE CV LAB;  Service: Cardiovascular;  Laterality: N/A;   LUMBAR PERCUTANEOUS PEDICLE  SCREW 3 LEVEL N/A 02/04/2018   Procedure: LUMBAR PERCUTANEOUS PEDICLE SCREW LUMBAR TWO - LUMBAR FIVE;  Surgeon: Barnett Abu, MD;  Location: [facility] OR;  Service: Neurosurgery;  Laterality: N/A;   POSTERIOR LAMINECTOMY / DECOMPRESSION LUMBAR SPINE  09/2001   due to herniated disc   SUPRAVENTRICULAR TACHYCARDIA ABLATION N/A 07/06/2014   Procedure: SUPRAVENTRICULAR TACHYCARDIA ABLATION;  Surgeon: Marinus Maw, MD;  Location: [facility] CATH LAB;  Service: Cardiovascular;   Laterality: N/A;   VAGINAL HYSTERECTOMY  1968   spotting    Prior to Admission medications   Medication Sig Start Date End Date Taking? Authorizing Provider  aspirin EC 81 MG tablet Take 81 mg by mouth as needed for mild pain (pain score 1-3) or fever. Swallow whole.   Yes [provider]  Bempedoic Acid-Ezetimibe (NEXLIZET) 180-10 MG TABS Take 1 tablet by mouth daily. 06/22/23  Yes Wendall Stade, MD  Cholecalciferol (VITAMIN D3) 50 MCG (2000 UT) capsule Take 1 capsule (2,000 Units total) by mouth daily. 02/10/22  Yes Joaquim Nam, MD  cyanocobalamin (VITAMIN B12) 1000 MCG/ML injection 1000 mcg injected IM every 14 days. 02/02/23  Yes Joaquim Nam, MD  glyBURIDE (DIABETA) 5 MG tablet Take 0.5 tablets (2.5 mg total) by mouth daily with breakfast. 05/14/23  Yes Joaquim Nam, MD  ibuprofen (ADVIL) 200 MG tablet Take 2-3 tablets (400-600 mg total) by mouth 2 (two) times daily as needed for moderate pain (with food). Patient taking differently: Take 400-600 mg by mouth as needed for moderate pain (pain score 4-6) or mild pain (pain score 1-3) (with food). 01/19/22  Yes Joaquim Nam, MD  losartan (COZAAR) 25 MG tablet Take 1 tablet (25 mg total) by mouth daily. 02/19/23  Yes Wendall Stade, MD  omeprazole (PRILOSEC) 20 MG capsule Take 1 capsule (20 mg total) by mouth 2 (two) times daily as needed (for acid reflux/indigestion.). 05/22/22  Yes Joaquim Nam, MD  nitroGLYCERIN (NITROSTAT) 0.4 MG SL tablet PLACE 1 TABLET UNDER THE TONGUE EVERY 5 MINUTES AS NEEDED FOR CHEST PAIN FOR UP TO 3 DOSES Patient not taking: Reported on 07/10/2023 11/10/20   Wendall Stade, MD    Current Facility-Administered Medications  Medication Dose Route Frequency Provider Last Rate Last Admin   bisacodyl (DULCOLAX) EC tablet 10 mg  10 mg Oral Once Buena Irish, MD       lactated ringers infusion   Intravenous Continuous Buena Irish, MD 100 mL/hr at 07/10/23 0501 New Bag at 07/10/23 0501    ondansetron (ZOFRAN) tablet 4 mg  4 mg Oral Q6H PRN Buena Irish, MD       Or   ondansetron [facility]) injection 4 mg  4 mg Intravenous Q6H PRN Buena Irish, MD       pantoprazole (PROTONIX) injection 40 mg  40 mg Intravenous Q24H Buena Irish, MD   40 mg at 07/10/23 0501   Current Outpatient Medications  Medication Sig Dispense Refill   aspirin EC 81 MG tablet Take 81 mg by mouth as needed for mild pain (pain score 1-3) or fever. Swallow whole.     Bempedoic Acid-Ezetimibe (NEXLIZET) 180-10 MG TABS Take 1 tablet by mouth daily. 90 tablet 2   Cholecalciferol (VITAMIN D3) 50 MCG (2000 UT) capsule Take 1 capsule (2,000 Units total) by mouth daily.     cyanocobalamin (VITAMIN B12) 1000 MCG/ML injection 1000 mcg injected IM every 14 days. 6 mL 3   glyBURIDE (DIABETA) 5 MG tablet Take 0.5 tablets (2.5 mg  total) by mouth daily with breakfast.     ibuprofen (ADVIL) 200 MG tablet Take 2-3 tablets (400-600 mg total) by mouth 2 (two) times daily as needed for moderate pain (with food). (Patient taking differently: Take 400-600 mg by mouth as needed for moderate pain (pain score 4-6) or mild pain (pain score 1-3) (with food).)     losartan (COZAAR) 25 MG tablet Take 1 tablet (25 mg total) by mouth daily. 90 tablet 3   omeprazole (PRILOSEC) 20 MG capsule Take 1 capsule (20 mg total) by mouth 2 (two) times daily as needed (for acid reflux/indigestion.). 180 capsule 3   nitroGLYCERIN (NITROSTAT) 0.4 MG SL tablet PLACE 1 TABLET UNDER THE TONGUE EVERY 5 MINUTES AS NEEDED FOR CHEST PAIN FOR UP TO 3 DOSES (Patient not taking: Reported on 07/10/2023) 25 tablet 7    Allergies as of 07/09/2023 - Review Complete 07/09/2023  Allergen Reaction Noted   Crestor [rosuvastatin] Other (See Comments) 12/18/2013   Ezetimibe-simvastatin Other (See Comments) 02/25/2010   Pravastatin Other (See Comments) 01/04/2016   Diazepam  10/06/2019   Lyrica [pregabalin]  05/16/2018   Praluent [alirocumab]  09/03/2018    Repatha [evolocumab]  01/10/2019   Acetaminophen Anxiety and Other (See Comments) 06/13/2010   Carvedilol Other (See Comments) 05/17/2010   Gabapentin Diarrhea 10/11/2017   Ibuprofen Other (See Comments) 12/16/2009   Insulin glargine Rash 02/13/2011   Levemir [insulin detemir] Rash 02/14/2012   Macrobid [nitrofurantoin macrocrystal] Nausea And Vomiting and Other (See Comments) July 17, 2012   Metformin Nausea And Vomiting 02/25/2010   Metoprolol tartrate Other (See Comments) 05/17/2010   Naproxen Nausea And Vomiting 12/16/2009    Family History  Problem Relation Age of Onset   Hypertension Mother    Heart disease Mother    Diabetes Sister    Myasthenia gravis Sister    Diabetes Brother        1/2 juvenile; foot ulcer; periph. neuropathy   Heart disease Maternal Grandfather        MI   Hypertension Maternal Grandfather    Depression Paternal Aunt    Hypertension Maternal Grandmother    Diabetes Maternal Grandmother    Stroke Maternal Grandmother    Hypertension Paternal Grandmother    Diabetes Paternal Grandmother    Hypertension Paternal Grandfather    Diabetes Paternal Grandfather    Breast cancer Maternal Aunt    Colon cancer Neg Hx     Social History   Socioeconomic History   Marital status: Married    Spouse name: Not on file   Number of children: 2   Years of education: Not on file   Highest education level: Not on file  Occupational History   Occupation: Retired; Hotel manager, [employer]    Employer: RETIRED    Comment: Works a couple days a week  Tobacco Use   Smoking status: Never   Smokeless tobacco: Never  Vaping Use   Vaping status: Never Used  Substance and Sexual Activity   Alcohol use: No    Alcohol/week: 0.0 standard drinks of alcohol   Drug use: No   Sexual activity: Yes  Other Topics Concern   Not on file  Social History Narrative   Her son died 17-Jul-2021.        Widowed July 17, 2009 after 52 years of marriage (husband had C.diff)    Remarried 07/17/2013   Right handed   One story home   Occasional caffeine   Social Drivers of Corporate investment banker Strain: Low Risk  (  08/02/2022)   Overall Financial Resource Strain (CARDIA)    Difficulty of Paying Living Expenses: Not hard at all  Food Insecurity: No Food Insecurity (08/07/2022)   Hunger Vital Sign    Worried About Running Out of Food in the Last Year: Never true    Ran Out of Food in the Last Year: Never true  Transportation Needs: No Transportation Needs (08/07/2022)   PRAPARE - Administrator, Civil Service (Medical): No    Lack of Transportation (Non-Medical): No  Physical Activity: Insufficiently Active (08/02/2022)   Exercise Vital Sign    Days of Exercise per Week: 2 days    Minutes of Exercise per Session: 30 min  Stress: No Stress Concern Present (08/02/2022)   [employer] of Occupational Health - Occupational Stress Questionnaire    Feeling of Stress : Not at all  Social Connections: Moderately Integrated (08/02/2022)   Social Connection and Isolation Panel [NHANES]    Frequency of Communication with Friends and Family: More than three times a week    Frequency of Social Gatherings with Friends and Family: More than three times a week    Attends Religious Services: More than 4 times per year    Active Member of [employer] or Organizations: No    Attends Banker Meetings: Never    Marital Status: Married  Catering manager Violence: Not At Risk (08/02/2022)   Humiliation, Afraid, Rape, and Kick questionnaire    Fear of Current or Ex-Partner: No    Emotionally Abused: No    Physically Abused: No    Sexually Abused: No    Review of Systems: As per HPI, all others negative  Physical Exam: Vital signs in last 24 hours: Temp:  [97.5 F (36.4 C)-98.7 F (37.1 C)] 97.6 F (36.4 C) (02/25 0745) Pulse Rate:  [80-104] 80 (02/25 0745) Resp:  [14-18] 16 (02/25 0745) BP: (110-181)/(70-87) 147/73 (02/25 0745) SpO2:  [93 %-100 %] 98 %  (02/25 0745) Weight:  [61.2 kg] 61.2 kg (02/24 1508)   General:   Alert,  Well-developed, well-nourished, pleasant and cooperative in NAD Head:  Normocephalic and atraumatic. Eyes:  Sclera clear, no icterus.   Conjunctiva pink. Ears:  Normal auditory acuity. Nose:  No deformity, discharge,  or lesions. Mouth:  No deformity or lesions.  Oropharynx pink but dry Neck:  Supple; no masses or thyromegaly. Lungs:  No respiratory visible distress Abdomen:  Soft, nontender and nondistended. No masses, hepatosplenomegaly or hernias noted. Normal bowel sounds, without guarding, and without rebound.     Msk:  Symmetrical without gross deformities. Normal posture. Pulses:  Normal pulses noted. Extremities:  Without clubbing or edema. Neurologic:  Alert and  oriented x4; diffusely weak, otherwise grossly normal neurologically. Skin:  Intact without significant lesions or rashes. Psych:  Alert and cooperative. Normal mood and affect.   Lab Results: Recent Labs    07/09/23 1631  WBC 13.6*  HGB 14.6  HCT 43.5  PLT 433*   BMET Recent Labs    07/09/23 1631 07/10/23 0500  NA 138 138  K 4.5 4.4  CL 103 104  CO2 19* 24  GLUCOSE 195* 154*  BUN 26* 26*  CREATININE 1.09* 1.00  CALCIUM 9.8 9.2   LFT Recent Labs    07/09/23 1631  PROT 7.3  ALBUMIN 3.9  AST 19  ALT 15  ALKPHOS 72  BILITOT 0.9   PT/INR No results for input(s): "LABPROT", "INR" in the last 72 hours.  Studies/Results: CT Angio Chest PE  W and/or Wo Contrast Result Date: 07/09/2023 CLINICAL DATA:  Chest pain radiating to back. EXAM: CT ANGIOGRAPHY CHEST WITH CONTRAST TECHNIQUE: Multidetector CT imaging of the chest was performed using the standard protocol during bolus administration of intravenous contrast. Multiplanar CT image reconstructions and MIPs were obtained to evaluate the vascular anatomy. RADIATION DOSE REDUCTION: This exam was performed according to the departmental dose-optimization program which includes  automated exposure control, adjustment of the mA and/or kV according to patient size and/or use of iterative reconstruction technique. CONTRAST:  57mL OMNIPAQUE IOHEXOL 350 MG/ML SOLN COMPARISON:  None Available. FINDINGS: Cardiovascular: No filling defects in the pulmonary arteries to suggest pulmonary emboli. Heart is normal size. Three-vessel coronary artery disease. No evidence of aortic aneurysm or dissection. Scattered aortic calcifications. Mediastinum/Nodes: No mediastinal, hilar, or axillary adenopathy. Trachea and esophagus are unremarkable. Thyroid unremarkable. Lungs/Pleura: Mosaic attenuation within the lungs compatible with areas of air trapping. No confluent opacities or effusions. Upper Abdomen: No acute findings Musculoskeletal: Chest wall soft tissues are unremarkable. No acute bony abnormality. Review of the MIP images confirms the above findings. IMPRESSION: No evidence of aortic aneurysm or XX chin. No evidence of pulmonary embolus. Coronary artery disease. Mosaic attenuation within the lungs compatible with areas of air trapping. No areas of consolidation. Aortic Atherosclerosis (ICD10-I70.0). Electronically Signed   By: Charlett Nose M.D.   On: 07/09/2023 23:52   DG Chest Portable 1 View Result Date: 07/09/2023 CLINICAL DATA:  Chest pain starting 2 days ago. Nausea, vomiting, and weakness. EXAM: PORTABLE CHEST 1 VIEW COMPARISON:  11/13/2014 FINDINGS: Shallow inspiration. Heart size and pulmonary vascularity are normal for technique. Lungs are clear. No pleural effusion or pneumothorax. Mediastinal contours appear intact. Calcification of the aorta. Postoperative changes in the cervical spine. Lumbar scoliosis convex towards the right. A loop recorder is present. IMPRESSION: Shallow inspiration.  No evidence of active pulmonary disease. Electronically Signed   By: Burman Nieves M.D.   On: 07/09/2023 23:03    Impression:   Nausea/vomiting.  Unable to eat x 2-3 days.  No  sialorrhea. Chest pain with eating.  Plan:   IV PPI.  IV fluids.  Antiemetics as needed. Sips clears as tolerated. Endoscopy tomorrow for further evaluation. Risks (bleeding, infection, bowel perforation that could require surgery, sedation-related changes in cardiopulmonary systems), benefits (identification and possible treatment of source of symptoms, exclusion of certain causes of symptoms), and alternatives (watchful waiting, radiographic imaging studies, empiric medical treatment) of upper endoscopy (EGD) were explained to patient/family in detail and patient wishes to proceed.  [facility] will follow.   LOS: 0 days   Kayliana Codd M  07/10/2023, 11:30 AM  Cell [phone] If no answer or after 5 PM call [phone]

## 2023-07-10 NOTE — H&P (Addendum)
 History and Physical    Patient: Maria [name] XBJ:[id number] DOB: [birth year] DOA: 07/09/2023 DOS: the patient was seen and examined on 07/10/2023 PCP: Joaquim Nam, MD  Patient coming from: Home  Chief Complaint: No chief complaint on file.  HPI: Maria [name] is a 83 y.o. female with medical history significant for DMT2, Htn, and hyperlipidemia presents because she has been having trouble after eating solid food.  She is finding herself getting weaker and weaker because she is not eating so she came in for evaluation.  The patient is able to drink liquids as long as they are not ice cold.  When she tries to eat food she can swallow it but she feels like it gets stuck in her chest.  Her chest will hurt after she eats and sometime later after eating she will throw up the food that she ate.  She is having bowel movements.  Her last bm was yesterday. No fevers or chills.  Review of Systems: As mentioned in the history of present illness. All other systems reviewed and are negative. Past Medical History:  Diagnosis Date   Anemia 05/1985   Carotid artery disease (HCC)    a. 02/2015 - 1-39% bilaterally.   Chronic back pain    Concussion    Coronary artery disease    a. s/p DES to [facility] 01/2015.   DDD (degenerative disc disease), cervical    DDD (degenerative disc disease), lumbar    Fracture of lower leg 07/2001   Right   GERD (gastroesophageal reflux disease)    Hyperlipidemia    Hypertension    Multinodular thyroid    Pneumonia ?1990's X 1   PSVT (paroxysmal supraventricular tachycardia) (HCC)    a. s/p catheter ablation of a parahisian atrial tachycardia which was successfully ablated from the non-coronary cusp of the aortic root 06/2014.   RBBB with left anterior fascicular block    Renal cyst 07/10/2006   bilateral   Right knee pain 02/2010   Injected - Dr. Shelle Iron   Sinus pause    a. By event monitoring 09/2014 - BB stopped at that time.   Statin intolerance    Stroke [facility])     Syncope and collapse 07/2001   "that's when I broke my leg"   Type II diabetes mellitus (HCC) 2002   Past Surgical History:  Procedure Laterality Date   ANTERIOR CERVICAL DECOMP/DISCECTOMY FUSION  10/03/2005   C4/5; C5/6  "it's got a plate in there"   ANTERIOR LAT LUMBAR FUSION Left 02/04/2018   Procedure: Lumbar two-three Lumbar three-four Lumbar four-five  Anterolateral decompression/percutaneous posterior arthrodesis, Mazor;  Surgeon: Barnett Abu, MD;  Location: [facility] OR;  Service: Neurosurgery;  Laterality: Left;   APPLICATION OF ROBOTIC ASSISTANCE FOR SPINAL PROCEDURE N/A 02/04/2018   Procedure: APPLICATION OF ROBOTIC ASSISTANCE FOR SPINAL PROCEDURE;  Surgeon: Barnett Abu, MD;  Location: [facility] OR;  Service: Neurosurgery;  Laterality: N/A;   BACK SURGERY     BREAST BIOPSY Bilateral 1968 (multiple)   "all benign"   BREAST LUMPECTOMY Bilateral 1968   CARDIAC CATHETERIZATION N/A 11/18/2014   Procedure: Left Heart Cath and Coronary Angiography;  Surgeon: Peter M Swaziland, MD;  Location: [facility] INVASIVE CV LAB;  Service: Cardiovascular;  Laterality: N/A;   CARDIAC CATHETERIZATION N/A 01/22/2015   Procedure: Coronary Stent Intervention;  Surgeon: Peter M Swaziland, MD;  Location: [facility] INVASIVE CV LAB;  Service: Cardiovascular;  Laterality: N/A;   CARDIAC CATHETERIZATION N/A 06/10/2015   Procedure: Left Heart Cath and Coronary  Angiography;  Surgeon: Peter M Swaziland, MD;  Location: [facility] INVASIVE CV LAB;  Service: Cardiovascular;  Laterality: N/A;   CATARACT EXTRACTION W/ INTRAOCULAR LENS  IMPLANT, BILATERAL Bilateral 12/2010   CORONARY ANGIOPLASTY     ESOPHAGOGASTRODUODENOSCOPY  08/14/2005   gastropathy biopsy, negative   LAPAROSCOPIC CHOLECYSTECTOMY  08/1992   LOOP RECORDER INSERTION N/A 08/19/2020   Procedure: LOOP RECORDER INSERTION;  Surgeon: Marinus Maw, MD;  Location: [facility] INVASIVE CV LAB;  Service: Cardiovascular;  Laterality: N/A;   LUMBAR PERCUTANEOUS PEDICLE SCREW 3 LEVEL N/A 02/04/2018   Procedure:  LUMBAR PERCUTANEOUS PEDICLE SCREW LUMBAR TWO - LUMBAR FIVE;  Surgeon: Barnett Abu, MD;  Location: [facility] OR;  Service: Neurosurgery;  Laterality: N/A;   POSTERIOR LAMINECTOMY / DECOMPRESSION LUMBAR SPINE  09/2001   due to herniated disc   SUPRAVENTRICULAR TACHYCARDIA ABLATION N/A 07/06/2014   Procedure: SUPRAVENTRICULAR TACHYCARDIA ABLATION;  Surgeon: Marinus Maw, MD;  Location: [facility] CATH LAB;  Service: Cardiovascular;  Laterality: N/A;   VAGINAL HYSTERECTOMY  1968   spotting   Social History:  reports that she has never smoked. She has never used smokeless tobacco. She reports that she does not drink alcohol and does not use drugs.  Allergies  Allergen Reactions   Crestor [Rosuvastatin] Other (See Comments)    MYALGIAS   Ezetimibe-Simvastatin Other (See Comments)    MYALGIAS   Pravastatin Other (See Comments)    Myalgias   Diazepam     Balance difficulty.     Lyrica [Pregabalin]     swelling   Nsaids Other (See Comments)    UNSPECIFIED REACTION   Praluent [Alirocumab]     myalgias   Repatha [Evolocumab]     Hoarse voice   Acetaminophen Anxiety and Other (See Comments)    REACTION: jittery with plain tylenol, but tolerated tylenol/benadryl combination   Carvedilol Other (See Comments)    nightmares   Gabapentin Diarrhea   Ibuprofen Other (See Comments)    REACTION: GI upset at high doses   Insulin Glargine Rash    Rash   Levemir [Insulin Detemir] Rash   Macrobid [Nitrofurantoin Macrocrystal] Nausea And Vomiting and Other (See Comments)    ACHES   Metformin Nausea And Vomiting    REACTION: GI upset   Metoprolol Tartrate Other (See Comments)    REACTION: nightmares   Naproxen Nausea And Vomiting    REACTION: GI upset    Family History  Problem Relation Age of Onset   Hypertension Mother    Heart disease Mother    Diabetes Sister    Myasthenia gravis Sister    Diabetes Brother        1/2 juvenile; foot ulcer; periph. neuropathy   Heart disease Maternal Grandfather         MI   Hypertension Maternal Grandfather    Depression Paternal Aunt    Hypertension Maternal Grandmother    Diabetes Maternal Grandmother    Stroke Maternal Grandmother    Hypertension Paternal Grandmother    Diabetes Paternal Grandmother    Hypertension Paternal Grandfather    Diabetes Paternal Grandfather    Breast cancer Maternal Aunt    Colon cancer Neg Hx     Prior to Admission medications   Medication Sig Start Date End Date Taking? Authorizing Provider  aspirin EC 81 MG tablet Take 81 mg by mouth as needed for mild pain (pain score 1-3) or fever. Swallow whole.   Yes [provider]  Bempedoic Acid-Ezetimibe (NEXLIZET) 180-10 MG TABS Take 1 tablet by  mouth daily. 06/22/23  Yes Wendall Stade, MD  Cholecalciferol (VITAMIN D3) 50 MCG (2000 UT) capsule Take 1 capsule (2,000 Units total) by mouth daily. 02/10/22  Yes Joaquim Nam, MD  cyanocobalamin (VITAMIN B12) 1000 MCG/ML injection 1000 mcg injected IM every 14 days. 02/02/23  Yes Joaquim Nam, MD  glyBURIDE (DIABETA) 5 MG tablet Take 0.5 tablets (2.5 mg total) by mouth daily with breakfast. 05/14/23  Yes Joaquim Nam, MD  ibuprofen (ADVIL) 200 MG tablet Take 2-3 tablets (400-600 mg total) by mouth 2 (two) times daily as needed for moderate pain (with food). Patient taking differently: Take 400-600 mg by mouth as needed for moderate pain (pain score 4-6) or mild pain (pain score 1-3) (with food). 01/19/22  Yes Joaquim Nam, MD  losartan (COZAAR) 25 MG tablet Take 1 tablet (25 mg total) by mouth daily. 02/19/23  Yes Wendall Stade, MD  omeprazole (PRILOSEC) 20 MG capsule Take 1 capsule (20 mg total) by mouth 2 (two) times daily as needed (for acid reflux/indigestion.). 05/22/22  Yes Joaquim Nam, MD  nitroGLYCERIN (NITROSTAT) 0.4 MG SL tablet PLACE 1 TABLET UNDER THE TONGUE EVERY 5 MINUTES AS NEEDED FOR CHEST PAIN FOR UP TO 3 DOSES Patient not taking: Reported on 07/10/2023 11/10/20   Wendall Stade, MD     Physical Exam: Vitals:   07/09/23 1509 07/09/23 2056 07/09/23 2323 07/10/23 0221  BP: 139/78 (!) 181/87 (!) 174/70 (!) 152/79  Pulse: 97 (!) 102 100 88  Resp: 14 16 18 17   Temp: 98.7 F (37.1 C) 98.5 F (36.9 C) 98 F (36.7 C) 97.9 F (36.6 C)  TempSrc: Oral Oral Oral Oral  SpO2: 97% 96% 98% 93%  Weight:      Height:       Physical Exam:  General: No acute distress, well developed, well nourished HEENT: Normocephalic, atraumatic, PERRL Cardiovascular: Normal rate and rhythm. Distal pulses intact. Pulmonary: Normal pulmonary effort, normal breath sounds Gastrointestinal: Nondistended abdomen, soft, non-tender, normoactive bowel sounds Musculoskeletal:Normal ROM, no lower ext edema Skin: Skin is warm and dry. Neuro: No focal deficits noted, AAOx3. PSYCH: Attentive and cooperative  Data Reviewed:  Results for orders placed or performed during the hospital encounter of 07/09/23 (from the past 24 hours)  Comprehensive metabolic panel     Status: Abnormal   Collection Time: 07/09/23  4:31 PM  Result Value Ref Range   Sodium 138 135 - 145 mmol/L   Potassium 4.5 3.5 - 5.1 mmol/L   Chloride 103 98 - 111 mmol/L   CO2 19 (L) 22 - 32 mmol/L   Glucose, Bld 195 (H) 70 - 99 mg/dL   BUN 26 (H) 8 - 23 mg/dL   Creatinine, Ser 8.29 (H) 0.44 - 1.00 mg/dL   Calcium 9.8 8.9 - 56.2 mg/dL   Total Protein 7.3 6.5 - 8.1 g/dL   Albumin 3.9 3.5 - 5.0 g/dL   AST 19 15 - 41 U/L   ALT 15 0 - 44 U/L   Alkaline Phosphatase 72 38 - 126 U/L   Total Bilirubin 0.9 0.0 - 1.2 mg/dL   GFR, Estimated 50 (L) >60 mL/min   Anion gap 16 (H) 5 - 15  CBC with Differential/Platelet     Status: Abnormal   Collection Time: 07/09/23  4:31 PM  Result Value Ref Range   WBC 13.6 (H) 4.0 - 10.5 K/uL   RBC 4.77 3.87 - 5.11 MIL/uL   Hemoglobin 14.6 12.0 - 15.0 g/dL  HCT 43.5 36.0 - 46.0 %   MCV 91.2 80.0 - 100.0 fL   MCH 30.6 26.0 - 34.0 pg   MCHC 33.6 30.0 - 36.0 g/dL   RDW 04.5 40.9 - 81.1 %   Platelets  433 (H) 150 - 400 K/uL   nRBC 0.0 0.0 - 0.2 %   Neutrophils Relative % 81 %   Neutro Abs 11.0 (H) 1.7 - 7.7 K/uL   Lymphocytes Relative 11 %   Lymphs Abs 1.4 0.7 - 4.0 K/uL   Monocytes Relative 8 %   Monocytes Absolute 1.1 (H) 0.1 - 1.0 K/uL   Eosinophils Relative 0 %   Eosinophils Absolute 0.0 0.0 - 0.5 K/uL   Basophils Relative 0 %   Basophils Absolute 0.0 0.0 - 0.1 K/uL   Immature Granulocytes 0 %   Abs Immature Granulocytes 0.06 0.00 - 0.07 K/uL  Lipase, blood     Status: None   Collection Time: 07/09/23  4:31 PM  Result Value Ref Range   Lipase 28 11 - 51 U/L  Troponin I (High Sensitivity)     Status: None   Collection Time: 07/09/23  4:31 PM  Result Value Ref Range   Troponin I (High Sensitivity) 9 <18 ng/L  Troponin I (High Sensitivity)     Status: None   Collection Time: 07/09/23  6:29 PM  Result Value Ref Range   Troponin I (High Sensitivity) 8 <18 ng/L  Resp panel by RT-PCR (RSV, Flu A&B, Covid) Anterior Nasal Swab     Status: None   Collection Time: 07/09/23  9:55 PM   Specimen: Anterior Nasal Swab  Result Value Ref Range   SARS Coronavirus 2 by RT PCR NEGATIVE NEGATIVE   Influenza A by PCR NEGATIVE NEGATIVE   Influenza B by PCR NEGATIVE NEGATIVE   Resp Syncytial Virus by PCR NEGATIVE NEGATIVE     Assessment and Plan: Chest pain and vomiting after eating solid food x  4 weeks -  GI consult in am - NPO  - Hold all unessential meds - IV Protonix  2. DMT2 -holding her oral medication until diet is resumed.  3.  Hypertension - we will continue most of her blood pressure medications.    Advance Care Planning:   Code Status: Full Code the patient names her daughter as her surrogate decision maker and wants to be full code.  Consults: GI  Family Communication: None  Severity of Illness: The appropriate patient status for this patient is INPATIENT. Inpatient status is judged to be reasonable and necessary in order to provide the required intensity of  service to ensure the patient's safety. The patient's presenting symptoms, physical exam findings, and initial radiographic and laboratory data in the context of their chronic comorbidities is felt to place them at high risk for further clinical deterioration. Furthermore, it is not anticipated that the patient will be medically stable for discharge from the hospital within 2 midnights of admission.   * I certify that at the point of admission it is my clinical judgment that the patient will require inpatient hospital care spanning beyond 2 midnights from the point of admission due to high intensity of service, high risk for further deterioration and high frequency of surveillance required.*  Author: Buena Irish, MD 07/10/2023 4:34 AM  For on call review [email or web address].

## 2023-07-11 ENCOUNTER — Inpatient Hospital Stay (HOSPITAL_COMMUNITY): Payer: Medicare PPO | Admitting: Anesthesiology

## 2023-07-11 ENCOUNTER — Other Ambulatory Visit (HOSPITAL_COMMUNITY): Payer: Self-pay | Admitting: Gastroenterology

## 2023-07-11 ENCOUNTER — Encounter (HOSPITAL_COMMUNITY)
Admission: EM | Disposition: A | Discharge status: Home Health | Payer: Self-pay | Source: Home / Self Care | Attending: Internal Medicine

## 2023-07-11 ENCOUNTER — Encounter (HOSPITAL_COMMUNITY): Payer: Self-pay | Admitting: Internal Medicine

## 2023-07-11 DIAGNOSIS — K2210 Ulcer of esophagus without bleeding: Secondary | ICD-10-CM | POA: Diagnosis not present

## 2023-07-11 DIAGNOSIS — R1310 Dysphagia, unspecified: Secondary | ICD-10-CM

## 2023-07-11 DIAGNOSIS — I2510 Atherosclerotic heart disease of native coronary artery without angina pectoris: Secondary | ICD-10-CM | POA: Diagnosis not present

## 2023-07-11 DIAGNOSIS — R079 Chest pain, unspecified: Secondary | ICD-10-CM

## 2023-07-11 DIAGNOSIS — E119 Type 2 diabetes mellitus without complications: Secondary | ICD-10-CM | POA: Diagnosis not present

## 2023-07-11 HISTORY — PX: BIOPSY: SHX5522

## 2023-07-11 HISTORY — PX: ESOPHAGOGASTRODUODENOSCOPY (EGD) WITH PROPOFOL: SHX5813

## 2023-07-11 LAB — CBC
HCT: 35.6 % — ABNORMAL LOW (ref 36.0–46.0)
Hemoglobin: 12.3 g/dL (ref 12.0–15.0)
MCH: 30.9 pg (ref 26.0–34.0)
MCHC: 34.6 g/dL (ref 30.0–36.0)
MCV: 89.4 fL (ref 80.0–100.0)
Platelets: 330 10*3/uL (ref 150–400)
RBC: 3.98 MIL/uL (ref 3.87–5.11)
RDW: 12.2 % (ref 11.5–15.5)
WBC: 7.3 10*3/uL (ref 4.0–10.5)
nRBC: 0 % (ref 0.0–0.2)

## 2023-07-11 LAB — BASIC METABOLIC PANEL
Anion gap: 11 (ref 5–15)
BUN: 16 mg/dL (ref 8–23)
CO2: 22 mmol/L (ref 22–32)
Calcium: 9 mg/dL (ref 8.9–10.3)
Chloride: 106 mmol/L (ref 98–111)
Creatinine, Ser: 0.85 mg/dL (ref 0.44–1.00)
GFR, Estimated: >60 mL/min (ref >60)
Glucose, Bld: 100 mg/dL — ABNORMAL HIGH (ref 70–99)
Potassium: 4.1 mmol/L (ref 3.5–5.1)
Sodium: 139 mmol/L (ref 135–145)

## 2023-07-11 LAB — GLUCOSE, CAPILLARY
Glucose-Capillary: 72 mg/dL (ref 70–99)
Glucose-Capillary: 96 mg/dL (ref 70–99)

## 2023-07-11 LAB — SURGICAL PCR SCREEN
MRSA, PCR: NEGATIVE
Staphylococcus aureus: NEGATIVE

## 2023-07-11 SURGERY — ESOPHAGOGASTRODUODENOSCOPY (EGD) WITH PROPOFOL
Anesthesia: Monitor Anesthesia Care | Laterality: Left

## 2023-07-11 MED ORDER — PANTOPRAZOLE SODIUM 40 MG IV SOLR
40.0000 mg | Freq: Two times a day (BID) | INTRAVENOUS | Status: DC
  Administered 2023-07-11 – 2023-07-12 (×2): 40 mg via INTRAVENOUS
  Filled 2023-07-11 (×2): qty 10

## 2023-07-11 MED ORDER — SUCRALFATE 1 GM/10ML PO SUSP
1.0000 g | Freq: Three times a day (TID) | ORAL | Status: DC
  Administered 2023-07-11 – 2023-07-14 (×12): 1 g via ORAL
  Filled 2023-07-11 (×12): qty 10

## 2023-07-11 MED ORDER — FAMOTIDINE IN NACL 20-0.9 MG/50ML-% IV SOLN
20.0000 mg | Freq: Once | INTRAVENOUS | Status: AC
  Administered 2023-07-11: 20 mg via INTRAVENOUS
  Filled 2023-07-11: qty 50

## 2023-07-11 MED ORDER — EZETIMIBE 10 MG PO TABS
10.0000 mg | ORAL_TABLET | Freq: Every day | ORAL | Status: DC
  Administered 2023-07-12 – 2023-07-14 (×3): 10 mg via ORAL
  Filled 2023-07-11 (×3): qty 1

## 2023-07-11 MED ORDER — SODIUM CHLORIDE 0.9 % IV SOLN: INTRAVENOUS | Status: DC | PRN

## 2023-07-11 MED ORDER — BEMPEDOIC ACID-EZETIMIBE 180-10 MG PO TABS: 1.0000 | ORAL_TABLET | Freq: Every day | ORAL | Status: DC

## 2023-07-11 MED ORDER — INSULIN ASPART 100 UNIT/ML IJ SOLN
0.0000 [IU] | Freq: Every day | INTRAMUSCULAR | Status: DC
Start: 2023-07-11 — End: 2023-07-14

## 2023-07-11 MED ORDER — LOSARTAN POTASSIUM 25 MG PO TABS
25.0000 mg | ORAL_TABLET | Freq: Every day | ORAL | Status: DC
  Administered 2023-07-11 – 2023-07-14 (×4): 25 mg via ORAL
  Filled 2023-07-11 (×4): qty 1

## 2023-07-11 MED ORDER — INSULIN ASPART 100 UNIT/ML IJ SOLN
0.0000 [IU] | Freq: Three times a day (TID) | INTRAMUSCULAR | Status: DC
  Administered 2023-07-13 (×2): 1 [IU] via SUBCUTANEOUS
  Administered 2023-07-13 – 2023-07-14 (×2): 2 [IU] via SUBCUTANEOUS

## 2023-07-11 MED ORDER — INSULIN ASPART 100 UNIT/ML IJ SOLN
2.0000 [IU] | Freq: Three times a day (TID) | INTRAMUSCULAR | Status: DC
  Administered 2023-07-12 – 2023-07-14 (×5): 2 [IU] via SUBCUTANEOUS

## 2023-07-11 MED ORDER — PROPOFOL 10 MG/ML IV BOLUS
INTRAVENOUS | Status: DC | PRN
Start: 2023-07-11 — End: 2023-07-11
  Administered 2023-07-11: 10 mg via INTRAVENOUS
  Administered 2023-07-11: 20 mg via INTRAVENOUS
  Administered 2023-07-11: 10 mg via INTRAVENOUS
  Administered 2023-07-11: 20 mg via INTRAVENOUS
  Administered 2023-07-11 (×2): 10 mg via INTRAVENOUS
  Administered 2023-07-11: 20 mg via INTRAVENOUS

## 2023-07-11 SURGICAL SUPPLY — 14 items

## 2023-07-11 NOTE — Op Note (Signed)
 [facility] Patient Name: Maria [name] Procedure Date : 07/11/2023 MRN: [id number] Attending MD: Willis Modena , MD, [id number] Date of Birth: [birth year] CSN: [id number] Age: 83 Admit Type: Inpatient Procedure:                Upper GI endoscopy Indications:              Odynophagia, Nausea with vomiting Providers:                Willis Modena, MD, Fransisca Connors, Marja Kays, Technician Referring MD:             [facility] Medicines:                Monitored Anesthesia Care Complications:            No immediate complications. Estimated Blood Loss:     Estimated blood loss: none. Procedure:                Pre-Anesthesia Assessment:                           - Prior to the procedure, a History and Physical                            was performed, and patient medications and                            allergies were reviewed. The patient's tolerance of                            previous anesthesia was also reviewed. The risks                            and benefits of the procedure and the sedation                            options and risks were discussed with the patient.                            All questions were answered, and informed consent                            was obtained. Prior Anticoagulants: The patient has                            taken no anticoagulant or antiplatelet agents                            except for aspirin. ASA Grade Assessment: III - A                            patient with severe systemic disease. After  reviewing the risks and benefits, the patient was                            deemed in satisfactory condition to undergo the                            procedure.                           After obtaining informed consent, the endoscope was                            passed under direct vision. Throughout the                            procedure, the patient's  blood pressure, pulse, and                            oxygen saturations were monitored continuously. The                            [id number]-[id number] ([id number]) Olympus endoscope was introduced                            through the mouth, and advanced to the second part                            of duodenum. The upper GI endoscopy was                            accomplished without difficulty. The patient                            tolerated the procedure well. Scope In: Scope Out: Findings:      Many cratered esophageal ulcers were found in mid-to-distal esophagus.       Proximal esophagus normal. Biopsies were taken with a cold forceps for       histology. No obvious esophageal mass or stricture was identified.      The exam of the esophagus was otherwise normal.      The entire examined stomach was normal.      The duodenal bulb, first portion of the duodenum and second portion of       the duodenum were normal. Impression:               - Esophageal ulcers. Biopsied.                           - Normal stomach.                           - Normal duodenal bulb, first portion of the                            duodenum and second portion of the duodenum. Recommendation:           -  Return patient to hospital ward for ongoing care.                           - Full liquid diet today.                           - Continue present medications.                           - IV PPI bid and sucralfate 1 gram po qac/qhs.                           - Await pathology results.                           [facility] will follow. Procedure Code(s):        --- Professional ---                           [phone], Esophagogastroduodenoscopy, flexible,                            transoral; with biopsy, single or multiple Diagnosis Code(s):        --- Professional ---                           K22.10, Ulcer of esophagus without bleeding                           R13.10, Dysphagia, unspecified                            R11.2, Nausea with vomiting, unspecified CPT copyright 2022 American Medical Association. All rights reserved. The codes documented in this report are preliminary and upon coder review may  be revised to meet current compliance requirements. Willis Modena, MD 07/11/2023 11:52:26 AM This report has been signed electronically. Number of Addenda: 0

## 2023-07-11 NOTE — Transfer of Care (Signed)
 Immediate Anesthesia Transfer of Care Note  Patient: Maria [name]  Procedure(s) Performed: ESOPHAGOGASTRODUODENOSCOPY (EGD) WITH PROPOFOL (Left) BIOPSY  Patient Location: PACU  Anesthesia Type:MAC  Level of Consciousness: drowsy  Airway & Oxygen Therapy: Patient Spontanous Breathing  Post-op Assessment: Report given to RN  Post vital signs: stable  Last Vitals:  Vitals Value Taken Time  BP 163/67 07/11/23 1138  Temp 36.7 C 07/11/23 1138  Pulse 85 07/11/23 1139  Resp 27 07/11/23 1139  SpO2 94 % 07/11/23 1139  Vitals shown include unfiled device data.  Last Pain:  Vitals:   07/11/23 1138  TempSrc: Temporal  PainSc:          Complications: No notable events documented.

## 2023-07-11 NOTE — Plan of Care (Signed)
  Problem: Activity: Goal: Risk for activity intolerance will decrease Outcome: Progressing   Problem: Skin Integrity: Goal: Risk for impaired skin integrity will decrease Outcome: Progressing   Problem: Skin Integrity: Goal: Risk for impaired skin integrity will decrease Outcome: Progressing

## 2023-07-11 NOTE — Plan of Care (Signed)
  Problem: Education: Goal: Knowledge of General Education information will improve Description: Including pain rating scale, medication(s)/side effects and non-pharmacologic comfort measures Outcome: Progressing   Problem: Health Behavior/Discharge Planning: Goal: Ability to manage health-related needs will improve Outcome: Progressing   Problem: Nutrition: Goal: Adequate nutrition will be maintained Outcome: Progressing   Problem: Coping: Goal: Level of anxiety will decrease Outcome: Progressing   Problem: Pain Managment: Goal: General experience of comfort will improve and/or be controlled Outcome: Progressing   Problem: Safety: Goal: Ability to remain free from injury will improve Outcome: Progressing

## 2023-07-11 NOTE — Progress Notes (Signed)
 Notified by Christoper Fabian, RPH  at 1331 that bempedoic acid/ezetimibe (Nexlizet) is nonformulary for pharmacy and that pt would have to bring medication from home. Pt's husband brought the medicine from home, but the order for the medication was d'c. I gave medication back to pt's spouse and informed him that he can take it back home along with her other medications. Pt and her spouse both verbally stated they understand and that he will take the medications with him when he leaves this evening.

## 2023-07-11 NOTE — Anesthesia Preprocedure Evaluation (Signed)
 Anesthesia Evaluation  Patient identified by MRN, date of birth, ID band Patient awake    Reviewed: Allergy & Precautions, NPO status , Patient's Chart, lab work & pertinent test results  Airway Mallampati: II  TM Distance: <3 FB Neck ROM: Full    Dental  (+) Teeth Intact, Dental Advisory Given   Pulmonary neg pulmonary ROS   Pulmonary exam normal breath sounds clear to auscultation       Cardiovascular hypertension, Pt. on medications + CAD, + Past MI and + Cardiac Stents  Normal cardiovascular exam+ dysrhythmias Supra Ventricular Tachycardia  Rhythm:Regular Rate:Normal     Neuro/Psych  PSYCHIATRIC DISORDERS  Depression     Neuromuscular disease CVA    GI/Hepatic Neg liver ROS,GERD  Medicated,,odynophagia, nausea/vomiting   Endo/Other  diabetes, Type 2, Oral Hypoglycemic Agents    Renal/GU negative Renal ROS     Musculoskeletal  (+) Arthritis ,    Abdominal   Peds  Hematology negative hematology ROS (+)   Anesthesia Other Findings Day of surgery medications reviewed with the patient.  Reproductive/Obstetrics                             Anesthesia Physical Anesthesia Plan  ASA: 3  Anesthesia Plan: MAC   Post-op Pain Management: Minimal or no pain anticipated   Induction: Intravenous  PONV Risk Score and Plan: 2 and TIVA  Airway Management Planned: Natural Airway and Simple Face Mask  Additional Equipment:   Intra-op Plan:   Post-operative Plan:   Informed Consent: I have reviewed the patients History and Physical, chart, labs and discussed the procedure including the risks, benefits and alternatives for the proposed anesthesia with the patient or authorized representative who has indicated his/her understanding and acceptance.     Dental advisory given  Plan Discussed with: CRNA  Anesthesia Plan Comments:        Anesthesia Quick Evaluation

## 2023-07-11 NOTE — Interval H&P Note (Signed)
 History and Physical Interval Note:  07/11/2023 11:05 AM  Maria [name]  has presented today for surgery, with the diagnosis of odynophagia, nausea/vomiting.  The various methods of treatment have been discussed with the patient and family. After consideration of risks, benefits and other options for treatment, the patient has consented to  Procedure(s): ESOPHAGOGASTRODUODENOSCOPY (EGD) WITH PROPOFOL (Left) as a surgical intervention.  The patient's history has been reviewed, patient examined, no change in status, stable for surgery.  I have reviewed the patient's chart and labs.  Questions were answered to the patient's satisfaction.     Freddy Jaksch

## 2023-07-11 NOTE — Progress Notes (Signed)
 [facility] PROGRESS NOTE    Progress Note  Maria [name]  ZOX:[id number] DOB: June 10, 1940 DOA: 07/09/2023 PCP: Joaquim Nam, MD     Brief Narrative:   Maria [name] is an 83 y.o. female past medical history of diabetes mellitus type 2, essential hypertension comes in today she started having trouble swallowing with solid foods.   Assessment/Plan:   Esophageal ulcer causing difficulty swallowing: GI was consulted EGD was done and 07/11/2023 that showed esophageal ulcer. Continue PPI and sucralfate before meals and at bedtime started on a full liquid diet.  Diabetes mellitus type 2: Continue hold oral hypoglycemic agents. Start on sliding scale insulin.  Essential hypertension: Blood pressure still elevated start home regimen.   DVT prophylaxis: lovenox Family Communication:none Status is: Inpatient Remains inpatient appropriate because: Difficulty swallowing solids due to esophageal ulcer    Code Status:     Code Status Orders  (From admission, onward)           Start     Ordered   07/10/23 0432  Full code  Continuous       Question:  By:  Answer:  Consent: discussion documented in EHR   07/10/23 0433           Code Status History     Date Active Date Inactive Code Status Order ID Comments User Context   08/19/2020 0506 08/20/2020 1546 Full Code [id number]  John Giovanni, MD ED   02/04/2018 1435 02/07/2018 1754 Full Code [id number]  Barnett Abu, MD Inpatient   06/10/2015 0918 06/10/2015 1512 Full Code [id number]  [place], Peter M, MD Inpatient   01/22/2015 0826 01/23/2015 1405 Full Code [id number]  [place], Peter M, MD Inpatient   11/18/2014 1011 11/18/2014 1631 Full Code [id number]  [place], Peter M, MD Inpatient   07/06/2014 1150 07/07/2014 1311 Full Code [id number]  Marinus Maw, MD Inpatient      Advance Directive Documentation    Flowsheet Row Most Recent Value  Type of Advance Directive Healthcare Power of Attorney, Living will  Pre-existing out  of facility DNR order (yellow form or pink MOST form) --  "MOST" Form in Place? --         IV Access:   Peripheral IV   Procedures and diagnostic studies:   CT Angio Chest PE W and/or Wo Contrast Result Date: 07/09/2023 CLINICAL DATA:  Chest pain radiating to back. EXAM: CT ANGIOGRAPHY CHEST WITH CONTRAST TECHNIQUE: Multidetector CT imaging of the chest was performed using the standard protocol during bolus administration of intravenous contrast. Multiplanar CT image reconstructions and MIPs were obtained to evaluate the vascular anatomy. RADIATION DOSE REDUCTION: This exam was performed according to the departmental dose-optimization program which includes automated exposure control, adjustment of the mA and/or kV according to patient size and/or use of iterative reconstruction technique. CONTRAST:  57mL OMNIPAQUE IOHEXOL 350 MG/ML SOLN COMPARISON:  None Available. FINDINGS: Cardiovascular: No filling defects in the pulmonary arteries to suggest pulmonary emboli. Heart is normal size. Three-vessel coronary artery disease. No evidence of aortic aneurysm or dissection. Scattered aortic calcifications. Mediastinum/Nodes: No mediastinal, hilar, or axillary adenopathy. Trachea and esophagus are unremarkable. Thyroid unremarkable. Lungs/Pleura: Mosaic attenuation within the lungs compatible with areas of air trapping. No confluent opacities or effusions. Upper Abdomen: No acute findings Musculoskeletal: Chest wall soft tissues are unremarkable. No acute bony abnormality. Review of the MIP images confirms the above findings. IMPRESSION: No evidence of aortic aneurysm or XX chin. No evidence of pulmonary embolus. Coronary artery disease. Mosaic  attenuation within the lungs compatible with areas of air trapping. No areas of consolidation. Aortic Atherosclerosis (ICD10-I70.0). Electronically Signed   By: Charlett Nose M.D.   On: 07/09/2023 23:52   DG Chest Portable 1 View Result Date: 07/09/2023 CLINICAL  DATA:  Chest pain starting 2 days ago. Nausea, vomiting, and weakness. EXAM: PORTABLE CHEST 1 VIEW COMPARISON:  11/13/2014 FINDINGS: Shallow inspiration. Heart size and pulmonary vascularity are normal for technique. Lungs are clear. No pleural effusion or pneumothorax. Mediastinal contours appear intact. Calcification of the aorta. Postoperative changes in the cervical spine. Lumbar scoliosis convex towards the right. A loop recorder is present. IMPRESSION: Shallow inspiration.  No evidence of active pulmonary disease. Electronically Signed   By: Burman Nieves M.D.   On: 07/09/2023 23:03     Medical Consultants:   None.   Subjective:    Maria [name] relates still with pain.  Objective:    Vitals:   07/11/23 1138 07/11/23 1140 07/11/23 1150 07/11/23 1200  BP: (!) 163/67 (!) 165/72 (!) 155/60 (!) 163/72  Pulse: 88 85 83 80  Resp: (!) 30 (!) 27 (!) 21 18  Temp: 98.1 F (36.7 C)     TempSrc: Temporal     SpO2: 97% 94% 96% 96%  Weight:      Height:       SpO2: 96 %   Intake/Output Summary (Last 24 hours) at 07/11/2023 1319 Last data filed at 07/11/2023 1139 Gross per 24 hour  Intake 150 ml  Output --  Net 150 ml   Filed Weights   07/09/23 1508  Weight: 61.2 kg    Exam: General exam: In no acute distress. Respiratory system: Good air movement and clear to auscultation. Cardiovascular system: S1 & S2 heard, RRR. No JVD.  Gastrointestinal system: Abdomen is nondistended, soft and nontender.  Central nervous system: Alert and oriented. No focal neurological deficits. Extremities: No pedal edema. Skin: No rashes, lesions or ulcers Psychiatry: Judgement and insight appear normal. Mood & affect appropriate.    Data Reviewed:    Labs: Basic Metabolic Panel: Recent Labs  Lab 07/09/23 1631 07/10/23 0500 07/11/23 0708  NA 138 138 139  K 4.5 4.4 4.1  CL 103 104 106  CO2 19* 24 22  GLUCOSE 195* 154* 100*  BUN 26* 26* 16  CREATININE 1.09* 1.00 0.85  CALCIUM  9.8 9.2 9.0   GFR Estimated Creatinine Clearance: 46.9 mL/min (by C-G formula based on SCr of 0.85 mg/dL). Liver Function Tests: Recent Labs  Lab 07/09/23 1631  AST 19  ALT 15  ALKPHOS 72  BILITOT 0.9  PROT 7.3  ALBUMIN 3.9   Recent Labs  Lab 07/09/23 1631  LIPASE 28   No results for input(s): "AMMONIA" in the last 168 hours. Coagulation profile No results for input(s): "INR", "PROTIME" in the last 168 hours. COVID-19 Labs  No results for input(s): "DDIMER", "FERRITIN", "LDH", "CRP" in the last 72 hours.  Lab Results  Component Value Date   SARSCOV2NAA NEGATIVE 07/09/2023   SARSCOV2NAA NEGATIVE 08/18/2020    CBC: Recent Labs  Lab 07/09/23 1631 07/11/23 0708  WBC 13.6* 7.3  NEUTROABS 11.0*  --   HGB 14.6 12.3  HCT 43.5 35.6*  MCV 91.2 89.4  PLT 433* 330   Cardiac Enzymes: No results for input(s): "CKTOTAL", "CKMB", "CKMBINDEX", "TROPONINI" in the last 168 hours. BNP (last 3 results) No results for input(s): "PROBNP" in the last 8760 hours. CBG: No results for input(s): "GLUCAP" in the last 168 hours.  D-Dimer: No results for input(s): "DDIMER" in the last 72 hours. Hgb A1c: No results for input(s): "HGBA1C" in the last 72 hours. Lipid Profile: No results for input(s): "CHOL", "HDL", "LDLCALC", "TRIG", "CHOLHDL", "LDLDIRECT" in the last 72 hours. Thyroid function studies: No results for input(s): "TSH", "T4TOTAL", "T3FREE", "THYROIDAB" in the last 72 hours.  Invalid input(s): "FREET3" Anemia work up: No results for input(s): "VITAMINB12", "FOLATE", "FERRITIN", "TIBC", "IRON", "RETICCTPCT" in the last 72 hours. Sepsis Labs: Recent Labs  Lab 07/09/23 1631 07/11/23 0708  WBC 13.6* 7.3   Microbiology Recent Results (from the past 240 hours)  Resp panel by RT-PCR (RSV, Flu A&B, Covid) Anterior Nasal Swab     Status: None   Collection Time: 07/09/23  9:55 PM   Specimen: Anterior Nasal Swab  Result Value Ref Range Status   SARS Coronavirus 2 by RT PCR  NEGATIVE NEGATIVE Final   Influenza A by PCR NEGATIVE NEGATIVE Final   Influenza B by PCR NEGATIVE NEGATIVE Final    Comment: (NOTE) The Xpert Xpress SARS-CoV-2/FLU/RSV plus assay is intended as an aid in the diagnosis of influenza from Nasopharyngeal swab specimens and should not be used as a sole basis for treatment. Nasal washings and aspirates are unacceptable for Xpert Xpress SARS-CoV-2/FLU/RSV testing.  Fact Sheet for Patients: BloggerCourse.com  Fact Sheet for Healthcare Providers: SeriousBroker.it  This test is not yet approved or cleared by the [place] FDA and has been authorized for detection and/or diagnosis of SARS-CoV-2 by FDA under an Emergency Use Authorization (EUA). This EUA will remain in effect (meaning this test can be used) for the duration of the COVID-19 declaration under Section 564(b)(1) of the Act, 21 U.S.C. section 360bbb-3(b)(1), unless the authorization is terminated or revoked.     Resp Syncytial Virus by PCR NEGATIVE NEGATIVE Final    Comment: (NOTE) Fact Sheet for Patients: BloggerCourse.com  Fact Sheet for Healthcare Providers: SeriousBroker.it  This test is not yet approved or cleared by the [place] FDA and has been authorized for detection and/or diagnosis of SARS-CoV-2 by FDA under an Emergency Use Authorization (EUA). This EUA will remain in effect (meaning this test can be used) for the duration of the COVID-19 declaration under Section 564(b)(1) of the Act, 21 U.S.C. section 360bbb-3(b)(1), unless the authorization is terminated or revoked.  Performed at [facility], [address], [place], [place] [address]   Surgical pcr screen     Status: None   Collection Time: 07/11/23  6:12 AM   Specimen: Nasal Mucosa; Nasal Swab  Result Value Ref Range Status   MRSA, PCR NEGATIVE NEGATIVE Final   Staphylococcus aureus  NEGATIVE NEGATIVE Final    Comment: (NOTE) The Xpert SA Assay (FDA approved for NASAL specimens in patients 29 years of age and older), is one component of a comprehensive surveillance program. It is not intended to diagnose infection nor to guide or monitor treatment. Performed at [facility], [address] [place], [place] [address]      Medications:    bisacodyl  10 mg Oral Once   pantoprazole (PROTONIX) IV  40 mg Intravenous Q12H   sucralfate  1 g Oral TID WC & HS   Continuous Infusions:    LOS: 1 day   Marinda Elk  [facility]  07/11/2023, 1:19 PM

## 2023-07-11 NOTE — Anesthesia Postprocedure Evaluation (Signed)
 Anesthesia Post Note  Patient: APPOLLONIA KLEE  Procedure(s) Performed: ESOPHAGOGASTRODUODENOSCOPY (EGD) WITH PROPOFOL (Left) BIOPSY     Patient location during evaluation: PACU Anesthesia Type: MAC Level of consciousness: awake and alert Pain management: pain level controlled Vital Signs Assessment: post-procedure vital signs reviewed and stable Respiratory status: spontaneous breathing, nonlabored ventilation, respiratory function stable and patient connected to nasal cannula oxygen Cardiovascular status: stable and blood pressure returned to baseline Postop Assessment: no apparent nausea or vomiting Anesthetic complications: no   No notable events documented.  Last Vitals:  Vitals:   07/11/23 1150 07/11/23 1200  BP: (!) 155/60 (!) 163/72  Pulse: 83 80  Resp: (!) 21 18  Temp:    SpO2: 96% 96%    Last Pain:  Vitals:   07/11/23 1412  TempSrc:   PainSc: 8                  Collene Schlichter

## 2023-07-11 NOTE — TOC Initial Note (Signed)
 Transition of Care (TOC) - Initial/Assessment Note   Spoke to patient and husband Circe Chilton at bedside. Facesheet information correct.   PCP is Crawford Givens   Patient from home with husband. Has Rollator and cane at home.     Transition of Care Department [facility]) has reviewed patient and no TOC needs have been identified at this time. We will continue to monitor patient advancement through interdisciplinary progression rounds. If new patient transition needs arise, please place a TOC consult.   Patient Details  Name: Maria [name] MRN: [id number] Date of Birth: 22-Sep-1940  Transition of Care [facility]) CM/SW Contact:    Kingsley Plan, RN Phone Number: 07/11/2023, 1:17 PM  Clinical Narrative:                   Expected Discharge Plan: Home/Self Care Barriers to Discharge: Continued Medical Work up   Patient Goals and CMS Choice Patient states their goals for this hospitalization and ongoing recovery are:: to return to home   Choice offered to / list presented to : NA      Expected Discharge Plan and Services   Discharge Planning Services: CM Consult Post Acute Care Choice: NA Living arrangements for the past 2 months: Single Family Home                 DME Arranged: N/A DME Agency: NA       HH Arranged: NA HH Agency: NA        Prior Living Arrangements/Services Living arrangements for the past 2 months: Single Family Home Lives with:: Spouse Patient language and need for interpreter reviewed:: Yes Do you feel safe going back to the place where you live?: Yes      Need for Family Participation in Patient Care: Yes (Comment) Care giver support system in place?: Yes (comment) Current home services: DME Criminal Activity/Legal Involvement Pertinent to Current Situation/Hospitalization: No - Comment as needed  Activities of Daily Living   ADL Screening (condition at time of admission) Independently performs ADLs?: Yes (appropriate for developmental  age) Is the patient deaf or have difficulty hearing?: No Does the patient have difficulty seeing, even when wearing glasses/contacts?: No Does the patient have difficulty concentrating, remembering, or making decisions?: No  Permission Sought/Granted   Permission granted to share information with : Yes, Verbal Permission Granted  Share Information with NAME: husband Pegah Segel     Permission granted to share info w Relationship: husband     Emotional Assessment Appearance:: Appears stated age Attitude/Demeanor/Rapport: Engaged Affect (typically observed): Appropriate Orientation: : Oriented to Self, Oriented to Place, Oriented to  Time, Oriented to Situation Alcohol / Substance Use: Not Applicable Psych Involvement: No (comment)  Admission diagnosis:  Difficulty swallowing solids [R13.10] Chest pain, unspecified type [R07.9] Patient Active Problem List   Diagnosis Date Noted   Difficulty swallowing solids 07/10/2023   Memory change 09/13/2022   Acute cystitis without hematuria 04/14/2022   Blister of left foot 09/20/2021   Osteoporosis 09/07/2021   Chronic pain 10/24/2020   Stroke (HCC) 08/19/2020   Hypokalemia 08/19/2020   Hyperglycemia 08/19/2020   Healthcare maintenance 08/24/2019   Advance care planning 08/24/2019   Hand pain 09/05/2018   Weakness 06/17/2018   B12 deficiency 06/16/2018   Neuropathy 06/13/2018   Neuropathic pain 05/19/2018   Edema 04/24/2018   Lumbosacral radiculopathy at L4 02/04/2018   Right sided sciatica 10/02/2017   Neck pain 01/02/2017   Bruising 09/19/2015   Carotid artery disease (HCC)  Coronary atherosclerosis of native coronary artery 01/22/2015   Coronary artery disease involving native coronary artery of native heart without angina pectoris 01/22/2015   Chest pain 11/18/2014   Sinus node dysfunction (HCC) 11/02/2014   SVT (supraventricular tachycardia) (HCC) 07/06/2014   Benign paroxysmal positional vertigo 05/21/2014   Weight  loss 06/09/2012   Functional diarrhea 02/01/2012   Type 2 diabetes mellitus with vascular disease (HCC) 02/13/2011   Shoulder pain, left 09/12/2010   Adjustment reaction 05/17/2010   Cough 03/23/2010   KNEE PAIN, RIGHT 10/01/2009   SYNCOPE 02/22/2009   PSVT 11/12/2008   GERD 11/12/2008   Renal cyst 11/12/2008   Backache 11/12/2008   Multiple thyroid nodules 08/07/2007   Reflux esophagitis 08/07/2007   Hyperlipidemia 08/21/2006   ANEMIA-NOS 08/21/2006   DEPRESSION 08/21/2006   Essential hypertension 08/21/2006   DEGENERATIVE DISC DISEASE, CERVICAL SPINE 08/21/2006   DEGENERATIVE DISC DISEASE, LUMBAR SPINE 08/21/2006   PCP:  Joaquim Nam, MD Pharmacy:   [facility] - [place], [place] - [address] [place] [address] Phone: [phone] Fax: [phone]     Social Drivers of Health (SDOH) Social History: SDOH Screenings   Food Insecurity: No Food Insecurity (07/10/2023)  Housing: Low Risk  (07/10/2023)  Transportation Needs: No Transportation Needs (07/10/2023)  Utilities: Not At Risk (07/10/2023)  Alcohol Screen: Low Risk  (08/02/2022)  Depression (PHQ2-9): Low Risk  (02/08/2023)  Financial Resource Strain: Low Risk  (08/02/2022)  Physical Activity: Insufficiently Active (08/02/2022)  Social Connections: Moderately Integrated (07/10/2023)  Stress: No Stress Concern Present (08/02/2022)  Tobacco Use: Low Risk  (07/11/2023)   SDOH Interventions:     Readmission Risk Interventions     No data to display

## 2023-07-12 DIAGNOSIS — K2210 Ulcer of esophagus without bleeding: Secondary | ICD-10-CM

## 2023-07-12 DIAGNOSIS — R1310 Dysphagia, unspecified: Secondary | ICD-10-CM | POA: Diagnosis not present

## 2023-07-12 LAB — GLUCOSE, CAPILLARY
Glucose-Capillary: 94 mg/dL (ref 70–99)
Glucose-Capillary: 84 mg/dL (ref 70–99)
Glucose-Capillary: 80 mg/dL (ref 70–99)
Glucose-Capillary: 127 mg/dL — ABNORMAL HIGH (ref 70–99)

## 2023-07-12 MED ORDER — PANTOPRAZOLE SODIUM 40 MG PO TBEC
40.0000 mg | DELAYED_RELEASE_TABLET | Freq: Two times a day (BID) | ORAL | Status: DC
  Administered 2023-07-12 – 2023-07-14 (×4): 40 mg via ORAL
  Filled 2023-07-12 (×4): qty 1

## 2023-07-12 MED ORDER — POLYETHYLENE GLYCOL 3350 17 G PO PACK
17.0000 g | PACK | Freq: Two times a day (BID) | ORAL | Status: AC
  Filled 2023-07-12 (×3): qty 1

## 2023-07-12 MED ORDER — MENTHOL 3 MG MT LOZG
1.0000 | LOZENGE | OROMUCOSAL | Status: DC | PRN
  Administered 2023-07-12: 3 mg via ORAL
  Filled 2023-07-12: qty 9

## 2023-07-12 NOTE — Plan of Care (Signed)

## 2023-07-12 NOTE — Progress Notes (Signed)
 [facility] PROGRESS NOTE    Progress Note  Maria [name]  OZH:[id number] DOB: 1941-01-31 DOA: 07/09/2023 PCP: Joaquim Nam, MD     Brief Narrative:   Maria [name] is an 83 y.o. female past medical history of diabetes mellitus type 2, essential hypertension comes in today she started having trouble swallowing with solid foods.   Assessment/Plan:   Esophageal ulcer causing difficulty swallowing: GI was consulted EGD was done and 07/11/2023 that showed esophageal ulcer. Continue PPI and sucralfate before meals and at bedtime started on a full liquid diet. Relates it still hurts when she swallows. Will try soft diet  Diabetes mellitus type 2: Continue hold oral hypoglycemic agents. Blood glucose well-controlled and sliding scale insulin.  Essential hypertension: Blood pressures is relatively controlled   DVT prophylaxis: lovenox Family Communication:none Status is: Inpatient Remains inpatient appropriate because: Difficulty swallowing solids due to esophageal ulcer    Code Status:     Code Status Orders  (From admission, onward)           Start     Ordered   07/10/23 0432  Full code  Continuous       Question:  By:  Answer:  Consent: discussion documented in EHR   07/10/23 0433           Code Status History     Date Active Date Inactive Code Status Order ID Comments User Context   08/19/2020 0506 08/20/2020 1546 Full Code [id number]  John Giovanni, MD ED   02/04/2018 1435 02/07/2018 1754 Full Code [id number]  Barnett Abu, MD Inpatient   06/10/2015 0918 06/10/2015 1512 Full Code [id number]  [place], Peter M, MD Inpatient   01/22/2015 0826 01/23/2015 1405 Full Code [id number]  [place], Peter M, MD Inpatient   11/18/2014 1011 11/18/2014 1631 Full Code [id number]  [place], Peter M, MD Inpatient   07/06/2014 1150 07/07/2014 1311 Full Code [id number]  Marinus Maw, MD Inpatient      Advance Directive Documentation    Flowsheet Row Most Recent Value  Type of  Advance Directive Healthcare Power of Attorney, Living will  Pre-existing out of facility DNR order (yellow form or pink MOST form) --  "MOST" Form in Place? --         IV Access:   Peripheral IV   Procedures and diagnostic studies:   No results found.    Medical Consultants:   None.   Subjective:    Maria [name] okay, but she still pain with swallowing  Objective:    Vitals:   07/11/23 1548 07/11/23 2010 07/12/23 0557 07/12/23 0923  BP: (!) 145/75 139/67 (!) 152/67 (!) 146/83  Pulse: 84 85 90 (!) 103  Resp: 16 16 16 18   Temp: 98 F (36.7 C) 98.2 F (36.8 C) 98.6 F (37 C) 97.8 F (36.6 C)  TempSrc: Oral Oral  Oral  SpO2: 97% 96% 99% 99%  Weight:      Height:       SpO2: 99 %   Intake/Output Summary (Last 24 hours) at 07/12/2023 0935 Last data filed at 07/11/2023 1139 Gross per 24 hour  Intake 150 ml  Output --  Net 150 ml   Filed Weights   07/09/23 1508  Weight: 61.2 kg    Exam: General exam: In no acute distress. Respiratory system: Good air movement and clear to auscultation. Cardiovascular system: S1 & S2 heard, RRR. No JVD. Gastrointestinal system: Abdomen is nondistended, soft and nontender.  Extremities: No pedal edema. Skin:  No rashes, lesions or ulcers Psychiatry: Judgement and insight appear normal. Mood & affect appropriate.  Data Reviewed:    Labs: Basic Metabolic Panel: Recent Labs  Lab 07/09/23 1631 07/10/23 0500 07/11/23 0708  NA 138 138 139  K 4.5 4.4 4.1  CL 103 104 106  CO2 19* 24 22  GLUCOSE 195* 154* 100*  BUN 26* 26* 16  CREATININE 1.09* 1.00 0.85  CALCIUM 9.8 9.2 9.0   GFR Estimated Creatinine Clearance: 46.9 mL/min (by C-G formula based on SCr of 0.85 mg/dL). Liver Function Tests: Recent Labs  Lab 07/09/23 1631  AST 19  ALT 15  ALKPHOS 72  BILITOT 0.9  PROT 7.3  ALBUMIN 3.9   Recent Labs  Lab 07/09/23 1631  LIPASE 28   No results for input(s): "AMMONIA" in the last 168  hours. Coagulation profile No results for input(s): "INR", "PROTIME" in the last 168 hours. COVID-19 Labs  No results for input(s): "DDIMER", "FERRITIN", "LDH", "CRP" in the last 72 hours.  Lab Results  Component Value Date   SARSCOV2NAA NEGATIVE 07/09/2023   SARSCOV2NAA NEGATIVE 08/18/2020    CBC: Recent Labs  Lab 07/09/23 1631 07/11/23 0708  WBC 13.6* 7.3  NEUTROABS 11.0*  --   HGB 14.6 12.3  HCT 43.5 35.6*  MCV 91.2 89.4  PLT 433* 330   Cardiac Enzymes: No results for input(s): "CKTOTAL", "CKMB", "CKMBINDEX", "TROPONINI" in the last 168 hours. BNP (last 3 results) No results for input(s): "PROBNP" in the last 8760 hours. CBG: Recent Labs  Lab 07/11/23 1633 07/11/23 2007 07/12/23 0927  GLUCAP 72 96 94   D-Dimer: No results for input(s): "DDIMER" in the last 72 hours. Hgb A1c: No results for input(s): "HGBA1C" in the last 72 hours. Lipid Profile: No results for input(s): "CHOL", "HDL", "LDLCALC", "TRIG", "CHOLHDL", "LDLDIRECT" in the last 72 hours. Thyroid function studies: No results for input(s): "TSH", "T4TOTAL", "T3FREE", "THYROIDAB" in the last 72 hours.  Invalid input(s): "FREET3" Anemia work up: No results for input(s): "VITAMINB12", "FOLATE", "FERRITIN", "TIBC", "IRON", "RETICCTPCT" in the last 72 hours. Sepsis Labs: Recent Labs  Lab 07/09/23 1631 07/11/23 0708  WBC 13.6* 7.3   Microbiology Recent Results (from the past 240 hours)  Resp panel by RT-PCR (RSV, Flu A&B, Covid) Anterior Nasal Swab     Status: None   Collection Time: 07/09/23  9:55 PM   Specimen: Anterior Nasal Swab  Result Value Ref Range Status   SARS Coronavirus 2 by RT PCR NEGATIVE NEGATIVE Final   Influenza A by PCR NEGATIVE NEGATIVE Final   Influenza B by PCR NEGATIVE NEGATIVE Final    Comment: (NOTE) The Xpert Xpress SARS-CoV-2/FLU/RSV plus assay is intended as an aid in the diagnosis of influenza from Nasopharyngeal swab specimens and should not be used as a sole basis  for treatment. Nasal washings and aspirates are unacceptable for Xpert Xpress SARS-CoV-2/FLU/RSV testing.  Fact Sheet for Patients: BloggerCourse.com  Fact Sheet for Healthcare Providers: SeriousBroker.it  This test is not yet approved or cleared by the [place] FDA and has been authorized for detection and/or diagnosis of SARS-CoV-2 by FDA under an Emergency Use Authorization (EUA). This EUA will remain in effect (meaning this test can be used) for the duration of the COVID-19 declaration under Section 564(b)(1) of the Act, 21 U.S.C. section 360bbb-3(b)(1), unless the authorization is terminated or revoked.     Resp Syncytial Virus by PCR NEGATIVE NEGATIVE Final    Comment: (NOTE) Fact Sheet for Patients: BloggerCourse.com  Fact Sheet for  Healthcare Providers: SeriousBroker.it  This test is not yet approved or cleared by the [place] and has been authorized for detection and/or diagnosis of SARS-CoV-2 by FDA under an Emergency Use Authorization (EUA). This EUA will remain in effect (meaning this test can be used) for the duration of the COVID-19 declaration under Section 564(b)(1) of the Act, 21 U.S.C. section 360bbb-3(b)(1), unless the authorization is terminated or revoked.  Performed at [facility], [address], [place], [place] [address]   Surgical pcr screen     Status: None   Collection Time: 07/11/23  6:12 AM   Specimen: Nasal Mucosa; Nasal Swab  Result Value Ref Range Status   MRSA, PCR NEGATIVE NEGATIVE Final   Staphylococcus aureus NEGATIVE NEGATIVE Final    Comment: (NOTE) The Xpert SA Assay (FDA approved for NASAL specimens in patients 67 years of age and older), is one component of a comprehensive surveillance program. It is not intended to diagnose infection nor to guide or monitor treatment. Performed at [facility],  [address] [place], [place] [address]      Medications:    bisacodyl  10 mg Oral Once   ezetimibe  10 mg Oral Daily   insulin aspart  0-5 Units Subcutaneous QHS   insulin aspart  0-9 Units Subcutaneous TID WC   insulin aspart  2 Units Subcutaneous TID WC   losartan  25 mg Oral Daily   pantoprazole (PROTONIX) IV  40 mg Intravenous Q12H   sucralfate  1 g Oral TID WC & HS   Continuous Infusions:    LOS: 2 days   Marinda Elk  [facility]  07/12/2023, 9:35 AM

## 2023-07-12 NOTE — Progress Notes (Signed)
 Subjective: Chest pain and odynophagia. Maybe slightly better but hasn't tried to eat much.  Objective: Vital signs in last 24 hours: Temp:  [97.8 F (36.6 C)-98.6 F (37 C)] 97.8 F (36.6 C) (02/27 0923) Pulse Rate:  [84-103] 103 (02/27 0923) Resp:  [16-18] 18 (02/27 0923) BP: (139-152)/(67-83) 146/83 (02/27 0923) SpO2:  [96 %-99 %] 99 % (02/27 0923) Weight change:     PE: GEN:  NAD ABD: Soft, non-tender  Lab Results: CBC    Component Value Date/Time   WBC 7.3 07/11/2023 0708   RBC 3.98 07/11/2023 0708   HGB 12.3 07/11/2023 0708   HCT 35.6 (L) 07/11/2023 0708   PLT 330 07/11/2023 0708   MCV 89.4 07/11/2023 0708   MCH 30.9 07/11/2023 0708   MCHC 34.6 07/11/2023 0708   RDW 12.2 07/11/2023 0708   LYMPHSABS 1.4 07/09/2023 1631   MONOABS 1.1 (H) 07/09/2023 1631   EOSABS 0.0 07/09/2023 1631   BASOSABS 0.0 07/09/2023 1631  CMP     Component Value Date/Time   NA 139 07/11/2023 0708   NA 143 08/26/2020 1631   K 4.1 07/11/2023 0708   CL 106 07/11/2023 0708   CO2 22 07/11/2023 0708   GLUCOSE 100 (H) 07/11/2023 0708   BUN 16 07/11/2023 0708   BUN 14 08/26/2020 1631   CREATININE 0.85 07/11/2023 0708   CREATININE 1.14 (H) 08/14/2019 1414   CALCIUM 9.0 07/11/2023 0708   PROT 7.3 07/09/2023 1631   PROT 6.6 08/26/2020 1631   ALBUMIN 3.9 07/09/2023 1631   ALBUMIN 4.3 08/26/2020 1631   AST 19 07/09/2023 1631   ALT 15 07/09/2023 1631   ALKPHOS 72 07/09/2023 1631   BILITOT 0.9 07/09/2023 1631   BILITOT 0.4 08/26/2020 1631   GFR 48.75 (L) 08/04/2022 0926   EGFR 57 (L) 08/26/2020 1631   GFRNONAA >60 07/11/2023 0708   Assessment:   Serpiginous distal esophageal ulcers.  Appearance not typical of reflux esophagitis.  Considerations include CMV or HSV esophagitis, pill ulcer(s), versus atypical appearance of reflux esophagitis. Odynophagia and post-prandial chest discomfort, from #1 above.  Plan:   Pantoprazole 40 mg po bid. Sucralfate 1 gram po qac/at  bedtime. Awaiting esophageal biopsy results. Soft diet; if tolerates soft diet, could possibly go home later today or tomorrow. [facility] will follow so long as she is in hospital.   Maria [name] 07/12/2023, 12:21 PM   Cell [phone] If no answer or after 5 PM call [phone]

## 2023-07-12 NOTE — Progress Notes (Addendum)
 Mobility Specialist Progress Note:   07/12/23 1044  Mobility  Activity Ambulated with assistance in hallway  Level of Assistance Contact guard assist, steadying assist  Assistive Device Front wheel walker  Distance Ambulated (ft) 110 ft  Activity Response Tolerated well  Mobility Referral Yes  Mobility visit 1 Mobility  Mobility Specialist Start Time (ACUTE ONLY) 1010  Mobility Specialist Stop Time (ACUTE ONLY) 1030  Mobility Specialist Time Calculation (min) (ACUTE ONLY) 20 min   Pt received in bed, eager to mobility. CG to stand and ambulate in hallway with RW. Pt needing 2x seated rest breaks d/t leg weakness. No knee buckling or unsteadiness present during session. When returning to room pt requesting to use BR. Void successful. Pt left in chair with call bell in reach and all needs met. Chair alarm on.   Leory Plowman  Mobility Specialist Please contact via SecureChat Rehab office at [phone]

## 2023-07-13 DIAGNOSIS — R1310 Dysphagia, unspecified: Secondary | ICD-10-CM | POA: Diagnosis not present

## 2023-07-13 DIAGNOSIS — K2210 Ulcer of esophagus without bleeding: Secondary | ICD-10-CM | POA: Diagnosis not present

## 2023-07-13 LAB — GLUCOSE, CAPILLARY
Glucose-Capillary: 125 mg/dL — ABNORMAL HIGH (ref 70–99)
Glucose-Capillary: 134 mg/dL — ABNORMAL HIGH (ref 70–99)
Glucose-Capillary: 122 mg/dL — ABNORMAL HIGH (ref 70–99)
Glucose-Capillary: 163 mg/dL — ABNORMAL HIGH (ref 70–99)
Glucose-Capillary: 139 mg/dL — ABNORMAL HIGH (ref 70–99)

## 2023-07-13 LAB — SURGICAL PATHOLOGY

## 2023-07-13 MED ORDER — ENSURE ENLIVE PO LIQD
237.0000 mL | Freq: Two times a day (BID) | ORAL | Status: DC
  Administered 2023-07-13 (×2): 237 mL via ORAL

## 2023-07-13 MED ORDER — LIDOCAINE VISCOUS HCL 2 % MT SOLN
15.0000 mL | OROMUCOSAL | Status: DC | PRN
  Administered 2023-07-13 – 2023-07-14 (×2): 15 mL via OROMUCOSAL
  Filled 2023-07-13 (×3): qty 15

## 2023-07-13 MED ORDER — LIDOCAINE VISCOUS HCL 2 % MT SOLN
15.0000 mL | Freq: Four times a day (QID) | OROMUCOSAL | Status: DC | PRN
  Administered 2023-07-13: 15 mL via OROMUCOSAL
  Filled 2023-07-13 (×2): qty 15

## 2023-07-13 NOTE — Care Management Important Message (Signed)
 Important Message  Patient Details  Name: BEATRIX BREECE MRN: [id number] Date of Birth: [birth year]   Important Message Given:  Yes - Medicare IM     Sherilyn Banker 07/13/2023, 1:00 PM

## 2023-07-13 NOTE — Evaluation (Signed)
 Physical Therapy Evaluation Patient Details Name: Maria [name] MRN: [id number] DOB: 1940/09/22 Today's Date: 07/13/2023  History of Present Illness  Patient is 83 y.o. female presented to ED for difficulty swallowing solid food on 07/09/23. EGD was done and 2/26 that showed esophageal ulcer. 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   Clinical Impression  Maria [name] is 83 y.o. female admitted with above HPI and diagnosis. Patient is currently limited by functional impairments below (see PT problem list). Patient lives with spouse and is ambulatory at mod ind level with RW at baseline. Currently pt required CGA for safety with bed mobility, min assist for transfers and gait with RW.Patient will benefit from continued skilled PT interventions to address impairments and progress independence with mobility. Acute PT will follow and progress as able.         If plan is discharge home, recommend the following: A little help with walking and/or transfers;A little help with bathing/dressing/bathroom;Assistance with cooking/housework;Assist for transportation;Help with stairs or ramp for entrance;Supervision due to cognitive status;Direct supervision/assist for medications management   Can travel by private vehicle        Equipment Recommendations None recommended by PT  Recommendations for Other Services       Functional Status Assessment Patient has had a recent decline in their functional status and demonstrates the ability to make significant improvements in function in a reasonable and predictable amount of time.     Precautions / Restrictions Precautions Precautions: Fall Recall of Precautions/Restrictions: Intact Precaution/Restrictions Comments: 2 falls in last 3 months Restrictions Weight Bearing Restrictions Per Provider Order: No      Mobility  Bed Mobility Overal bed  mobility: Needs Assistance Bed Mobility: Supine to Sit     Supine to sit: Contact guard, HOB elevated, Used rails     General bed mobility comments: cues for use of bed features    Transfers Overall transfer level: Needs assistance Equipment used: Rolling walker (2 wheels) Transfers: Sit to/from Stand Sit to Stand: Min assist           General transfer comment: min assist to power up and steady in standing    Ambulation/Gait Ambulation/Gait assistance: Min assist Gait Distance (Feet): 50 Feet (2x) Assistive device: Rolling walker (2 wheels) Gait Pattern/deviations: Step-through pattern, Decreased stride length, Trunk flexed, Shuffle Gait velocity: decr     General Gait Details: low hip flexion and foot clearance, posture more flexed as pt fatigued.  Stairs            Wheelchair Mobility     Tilt Bed    Modified Rankin (Stroke Patients Only)       Balance Overall balance assessment: Needs assistance Sitting-balance support: Feet supported Sitting balance-Leahy Scale: Fair     Standing balance support: Reliant on assistive device for balance, During functional activity, Bilateral upper extremity supported Standing balance-Leahy Scale: Poor Standing balance comment: reliant on RW                             Pertinent Vitals/Pain Pain Assessment Pain Assessment: No/denies pain    Home Living Family/patient expects to be discharged to:: Private residence Living Arrangements: Spouse/significant other Available Help at Discharge: Family Type of Home: House Home Access: Stairs to enter Entrance Stairs-Rails: Lawyer of Steps: 2+1   Home Layout: One level Home Equipment: Air traffic controller  seat;Grab bars - tub/shower;Rolling Walker (2 wheels);Rollator (4 wheels);Cane - single point;Wheelchair - manual Additional Comments: spouse present and confirming/correcting some home set up    Prior Function Prior Level of Function :  Needs assist             Mobility Comments: pt using RW for mobility in home, typically Mod Ind, has had 2 falls in last 3 months. ADLs Comments: pt ind for dressing and sink baths, afraid to get into shower.     Extremity/Trunk Assessment   Upper Extremity Assessment Upper Extremity Assessment: Generalized weakness    Lower Extremity Assessment Lower Extremity Assessment: Generalized weakness    Cervical / Trunk Assessment Cervical / Trunk Assessment: Kyphotic  Communication   Communication Communication: Impaired Factors Affecting Communication: Hearing impaired    Cognition Arousal: Alert Behavior During Therapy: WFL for tasks assessed/performed   PT - Cognitive impairments: Memory                         Following commands: Intact       Cueing Cueing Techniques: Verbal cues     General Comments      Exercises     Assessment/Plan    PT Assessment Patient needs continued PT services  PT Problem List Decreased strength;Decreased range of motion;Decreased activity tolerance;Decreased balance;Decreased mobility;Decreased coordination;Decreased cognition;Decreased knowledge of use of DME;Decreased safety awareness;Decreased knowledge of precautions       PT Treatment Interventions DME instruction;Gait training;Stair training;Functional mobility training;Therapeutic activities;Therapeutic exercise;Balance training;Neuromuscular re-education;Patient/family education    PT Goals (Current goals can be found in the Care Plan section)  Acute Rehab PT Goals Patient Stated Goal: recover strength and get home PT Goal Formulation: With patient Time For Goal Achievement: 07/27/23 Potential to Achieve Goals: Good    Frequency Min 1X/week     Co-evaluation               AM-PAC PT "6 Clicks" Mobility  Outcome Measure Help needed turning from your back to your side while in a flat bed without using bedrails?: A Little Help needed moving from  lying on your back to sitting on the side of a flat bed without using bedrails?: A Little Help needed moving to and from a bed to a chair (including a wheelchair)?: A Little Help needed standing up from a chair using your arms (e.g., wheelchair or bedside chair)?: A Little Help needed to walk in hospital room?: A Little Help needed climbing 3-5 steps with a railing? : A Lot 6 Click Score: 17    End of Session Equipment Utilized During Treatment: Gait belt Activity Tolerance: Patient tolerated treatment well Patient left: in chair;with call bell/phone within reach;with chair alarm set;with family/visitor present Nurse Communication: Mobility status PT Visit Diagnosis: Other abnormalities of gait and mobility (R26.89);Muscle weakness (generalized) (M62.81);Difficulty in walking, not elsewhere classified (R26.2);Other symptoms and signs involving the nervous system (R29.898)    Time: 8657-8469 PT Time Calculation (min) (ACUTE ONLY): 21 min   Charges:   PT Evaluation $PT Eval Moderate Complexity: 1 Mod   PT General Charges $$ ACUTE PT VISIT: 1 Visit         Wynn Maudlin, DPT Acute Rehabilitation Services Office [phone]  07/13/23 10:37 AM

## 2023-07-13 NOTE — Progress Notes (Signed)
 [facility] PROGRESS NOTE    Progress Note  PARISSA CHIAO  ZOX:[id number] DOB: [birth year] DOA: 07/09/2023 PCP: Joaquim Nam, MD     Brief Narrative:   Maria [name] is an 83 y.o. female past medical history of diabetes mellitus type 2, essential hypertension comes in today she started having trouble swallowing with solid foods.   Assessment/Plan:   Esophageal ulcer causing difficulty swallowing: GI was consulted EGD was done and 07/11/2023 that showed esophageal ulcer. Continue PPI and sucralfate before meals and at bedtime started on a full liquid diet. Relates it still hurts when she swallows. She relates still hurts when she eats she had a poor oral intake yesterday. Started on lidocaine viscous gel she will  Diabetes mellitus type 2: Continue hold oral hypoglycemic agents. Blood glucose well-controlled and sliding scale insulin.  Essential hypertension: Blood pressures is relatively controlled   DVT prophylaxis: lovenox Family Communication:none Status is: Inpatient Remains inpatient appropriate because: Difficulty swallowing solids due to esophageal ulcer    Code Status:     Code Status Orders  (From admission, onward)           Start     Ordered   07/10/23 0432  Full code  Continuous       Question:  By:  Answer:  Consent: discussion documented in EHR   07/10/23 0433           Code Status History     Date Active Date Inactive Code Status Order ID Comments User Context   08/19/2020 0506 08/20/2020 1546 Full Code [id number]  John Giovanni, MD ED   02/04/2018 1435 02/07/2018 1754 Full Code [id number]  Barnett Abu, MD Inpatient   06/10/2015 0918 06/10/2015 1512 Full Code [id number]  [place], Peter M, MD Inpatient   01/22/2015 0826 01/23/2015 1405 Full Code [id number]  [place], Peter M, MD Inpatient   11/18/2014 1011 11/18/2014 1631 Full Code [id number]  [place], Peter M, MD Inpatient   07/06/2014 1150 07/07/2014 1311 Full Code [id number]  Marinus Maw,  MD Inpatient      Advance Directive Documentation    Flowsheet Row Most Recent Value  Type of Advance Directive Healthcare Power of Attorney, Living will  Pre-existing out of facility DNR order (yellow form or pink MOST form) --  "MOST" Form in Place? --         IV Access:   Peripheral IV   Procedures and diagnostic studies:   No results found.    Medical Consultants:   None.   Subjective:    [name] is still very painful when she swallows  Objective:    Vitals:   07/12/23 1635 07/12/23 2045 07/13/23 0518 07/13/23 0746  BP: 128/84 (!) 150/70 (!) 156/77 (!) 145/79  Pulse: 88 93 89 87  Resp: 17 18 18 18   Temp: (!) 97.3 F (36.3 C) 98.3 F (36.8 C) 98.7 F (37.1 C) (!) 97.2 F (36.2 C)  TempSrc:  Oral Oral   SpO2: 96% 94% 96% 96%  Weight:      Height:       SpO2: 96 %   Intake/Output Summary (Last 24 hours) at 07/13/2023 5956 Last data filed at 07/12/2023 1700 Gross per 24 hour  Intake 600 ml  Output --  Net 600 ml   Filed Weights   07/09/23 1508  Weight: 61.2 kg    Exam: General exam: In no acute distress. Respiratory system: Good air movement and clear to auscultation. Cardiovascular system: S1 & S2  heard, RRR. No JVD. Gastrointestinal system: Abdomen is nondistended, soft and nontender.  Extremities: No pedal edema. Skin: No rashes, lesions or ulcers Psychiatry: Judgement and insight appear normal. Mood & affect appropriate. Data Reviewed:    Labs: Basic Metabolic Panel: Recent Labs  Lab 07/09/23 1631 07/10/23 0500 07/11/23 0708  NA 138 138 139  K 4.5 4.4 4.1  CL 103 104 106  CO2 19* 24 22  GLUCOSE 195* 154* 100*  BUN 26* 26* 16  CREATININE 1.09* 1.00 0.85  CALCIUM 9.8 9.2 9.0   GFR Estimated Creatinine Clearance: 46.9 mL/min (by C-G formula based on SCr of 0.85 mg/dL). Liver Function Tests: Recent Labs  Lab 07/09/23 1631  AST 19  ALT 15  ALKPHOS 72  BILITOT 0.9  PROT 7.3  ALBUMIN 3.9   Recent Labs   Lab 07/09/23 1631  LIPASE 28   No results for input(s): "AMMONIA" in the last 168 hours. Coagulation profile No results for input(s): "INR", "PROTIME" in the last 168 hours. COVID-19 Labs  No results for input(s): "DDIMER", "FERRITIN", "LDH", "CRP" in the last 72 hours.  Lab Results  Component Value Date   SARSCOV2NAA NEGATIVE 07/09/2023   SARSCOV2NAA NEGATIVE 08/18/2020    CBC: Recent Labs  Lab 07/09/23 1631 07/11/23 0708  WBC 13.6* 7.3  NEUTROABS 11.0*  --   HGB 14.6 12.3  HCT 43.5 35.6*  MCV 91.2 89.4  PLT 433* 330   Cardiac Enzymes: No results for input(s): "CKTOTAL", "CKMB", "CKMBINDEX", "TROPONINI" in the last 168 hours. BNP (last 3 results) No results for input(s): "PROBNP" in the last 8760 hours. CBG: Recent Labs  Lab 07/12/23 0927 07/12/23 1153 07/12/23 1634 07/12/23 2044 07/13/23 0748  GLUCAP 94 84 80 127* 125*   D-Dimer: No results for input(s): "DDIMER" in the last 72 hours. Hgb A1c: No results for input(s): "HGBA1C" in the last 72 hours. Lipid Profile: No results for input(s): "CHOL", "HDL", "LDLCALC", "TRIG", "CHOLHDL", "LDLDIRECT" in the last 72 hours. Thyroid function studies: No results for input(s): "TSH", "T4TOTAL", "T3FREE", "THYROIDAB" in the last 72 hours.  Invalid input(s): "FREET3" Anemia work up: No results for input(s): "VITAMINB12", "FOLATE", "FERRITIN", "TIBC", "IRON", "RETICCTPCT" in the last 72 hours. Sepsis Labs: Recent Labs  Lab 07/09/23 1631 07/11/23 0708  WBC 13.6* 7.3   Microbiology Recent Results (from the past 240 hours)  Resp panel by RT-PCR (RSV, Flu A&B, Covid) Anterior Nasal Swab     Status: None   Collection Time: 07/09/23  9:55 PM   Specimen: Anterior Nasal Swab  Result Value Ref Range Status   SARS Coronavirus 2 by RT PCR NEGATIVE NEGATIVE Final   Influenza A by PCR NEGATIVE NEGATIVE Final   Influenza B by PCR NEGATIVE NEGATIVE Final    Comment: (NOTE) The Xpert Xpress SARS-CoV-2/FLU/RSV plus assay  is intended as an aid in the diagnosis of influenza from Nasopharyngeal swab specimens and should not be used as a sole basis for treatment. Nasal washings and aspirates are unacceptable for Xpert Xpress SARS-CoV-2/FLU/RSV testing.  Fact Sheet for Patients: BloggerCourse.com  Fact Sheet for Healthcare Providers: SeriousBroker.it  This test is not yet approved or cleared by the [place] FDA and has been authorized for detection and/or diagnosis of SARS-CoV-2 by FDA under an Emergency Use Authorization (EUA). This EUA will remain in effect (meaning this test can be used) for the duration of the COVID-19 declaration under Section 564(b)(1) of the Act, 21 U.S.C. section 360bbb-3(b)(1), unless the authorization is terminated or revoked.  Resp Syncytial Virus by PCR NEGATIVE NEGATIVE Final    Comment: (NOTE) Fact Sheet for Patients: BloggerCourse.com  Fact Sheet for Healthcare Providers: SeriousBroker.it  This test is not yet approved or cleared by the [place] FDA and has been authorized for detection and/or diagnosis of SARS-CoV-2 by FDA under an Emergency Use Authorization (EUA). This EUA will remain in effect (meaning this test can be used) for the duration of the COVID-19 declaration under Section 564(b)(1) of the Act, 21 U.S.C. section 360bbb-3(b)(1), unless the authorization is terminated or revoked.  Performed at [facility], [address] [place], [place] [address]   Surgical pcr screen     Status: None   Collection Time: 07/11/23  6:12 AM   Specimen: Nasal Mucosa; Nasal Swab  Result Value Ref Range Status   MRSA, PCR NEGATIVE NEGATIVE Final   Staphylococcus aureus NEGATIVE NEGATIVE Final    Comment: (NOTE) The Xpert SA Assay (FDA approved for NASAL specimens in patients 72 years of age and older), is one component of a comprehensive surveillance  program. It is not intended to diagnose infection nor to guide or monitor treatment. Performed at [facility], [address], [place], [place] [address]      Medications:    bisacodyl  10 mg Oral Once   ezetimibe  10 mg Oral Daily   feeding supplement  237 mL Oral BID BM   insulin aspart  0-5 Units Subcutaneous QHS   insulin aspart  0-9 Units Subcutaneous TID WC   insulin aspart  2 Units Subcutaneous TID WC   losartan  25 mg Oral Daily   pantoprazole  40 mg Oral BID   polyethylene glycol  17 g Oral BID   sucralfate  1 g Oral TID WC & HS   Continuous Infusions:    LOS: 3 days   Marinda Elk  [facility]  07/13/2023, 9:21 AM

## 2023-07-13 NOTE — Progress Notes (Signed)
 Persistent odynophagia today per chart review.  EGD showed severe ulcerative esophagitis; biopsies negative for cmv/hsv.  Continue PPI and sucralfate.  Starting viscous lidocaine.  Soft diet as tolerated.  [facility] will follow.

## 2023-07-13 NOTE — TOC Progression Note (Addendum)
 Transition of Care (TOC) - Progression Note   PT recommending HHPT . Discussed with patient at bedside. Patient agreeable. NCM asked if NCM can also call husband to discuss. Patient stated husband just left for an appointment and asked NCM to come back later today . Husband will come back to hospital after appointment.   NCM will follow up later today   1430 Husband at bedside. Explained above in agreement , no preference . Clifton Custard with [facility] accepted referral . Asked MD to sign order.  Patient Details  Name: Maria [name] MRN: [id number] Date of Birth: 26-May-1940  Transition of Care [facility]) CM/SW Contact  Remi Lopata, Adria Devon, RN Phone Number: 07/13/2023, 11:47 AM  Clinical Narrative:       Expected Discharge Plan: Home/Self Care Barriers to Discharge: Continued Medical Work up  Expected Discharge Plan and Services   Discharge Planning Services: CM Consult Post Acute Care Choice: NA Living arrangements for the past 2 months: Single Family Home                 DME Arranged: N/A DME Agency: NA       HH Arranged: NA HH Agency: NA         Social Determinants of Health (SDOH) Interventions SDOH Screenings   Food Insecurity: No Food Insecurity (07/10/2023)  Housing: Low Risk  (07/10/2023)  Transportation Needs: No Transportation Needs (07/10/2023)  Utilities: Not At Risk (07/10/2023)  Alcohol Screen: Low Risk  (08/02/2022)  Depression (PHQ2-9): Low Risk  (02/08/2023)  Financial Resource Strain: Low Risk  (08/02/2022)  Physical Activity: Insufficiently Active (08/02/2022)  Social Connections: Moderately Integrated (07/10/2023)  Stress: No Stress Concern Present (08/02/2022)  Tobacco Use: Low Risk  (07/11/2023)    Readmission Risk Interventions     No data to display

## 2023-07-13 NOTE — Plan of Care (Signed)
   Problem: Education: Goal: Knowledge of General Education information will improve Description: Including pain rating scale, medication(s)/side effects and non-pharmacologic comfort measures Outcome: Progressing   Problem: Health Behavior/Discharge Planning: Goal: Ability to manage health-related needs will improve Outcome: Progressing   Problem: Clinical Measurements: Goal: Ability to maintain clinical measurements within normal limits will improve Outcome: Progressing Goal: Will remain free from infection Outcome: Progressing   Problem: Skin Integrity: Goal: Risk for impaired skin integrity will decrease Outcome: Progressing

## 2023-07-13 NOTE — Plan of Care (Signed)

## 2023-07-13 NOTE — Progress Notes (Signed)
 Mobility Specialist Progress Note:   07/13/23 1214  Mobility  Activity Ambulated with assistance to bathroom  Level of Assistance Contact guard assist, steadying assist  Assistive Device Front wheel walker  Distance Ambulated (ft) 15 ft  Activity Response Tolerated well  Mobility Referral Yes  Mobility visit 1 Mobility  Mobility Specialist Start Time (ACUTE ONLY) 1145  Mobility Specialist Stop Time (ACUTE ONLY) 1200  Mobility Specialist Time Calculation (min) (ACUTE ONLY) 15 min   Pt received in bed, requesting assistance to BR. CG to stand and ambulate to BR. Void unsuccessful. Pt declining further ambulation d/t fatigue and weakness. Pt returned to bed with call bell in reach and all needs met.   Leory Plowman  Mobility Specialist Please contact via [employer] office at [phone]

## 2023-07-14 ENCOUNTER — Other Ambulatory Visit (HOSPITAL_COMMUNITY): Payer: Self-pay

## 2023-07-14 DIAGNOSIS — R1310 Dysphagia, unspecified: Secondary | ICD-10-CM | POA: Diagnosis not present

## 2023-07-14 LAB — GLUCOSE, CAPILLARY: Glucose-Capillary: 163 mg/dL — ABNORMAL HIGH (ref 70–99)

## 2023-07-14 MED ORDER — LIDOCAINE VISCOUS HCL 2 % MT SOLN
15.0000 mL | OROMUCOSAL | 0 refills | Status: DC | PRN
  Filled 2023-07-14: qty 50, 1d supply, fill #0

## 2023-07-14 MED ORDER — SUCRALFATE 1 GM/10ML PO SUSP
1.0000 g | Freq: Three times a day (TID) | ORAL | 0 refills | Status: DC
  Filled 2023-07-14: qty 420, 11d supply, fill #0

## 2023-07-14 MED ORDER — PANTOPRAZOLE SODIUM 40 MG PO TBEC
40.0000 mg | DELAYED_RELEASE_TABLET | Freq: Two times a day (BID) | ORAL | 1 refills | Status: DC
  Filled 2023-07-14: qty 50, 25d supply, fill #0

## 2023-07-14 NOTE — Progress Notes (Signed)
 Mobility Specialist Progress Note:    07/14/23 1100  Mobility  Activity Ambulated with assistance in room  Level of Assistance Minimal assist, patient does 75% or more  Assistive Device Front wheel walker  Distance Ambulated (ft) 15 ft  Activity Response Tolerated well  Mobility Referral Yes  Mobility visit 1 Mobility  Mobility Specialist Start Time (ACUTE ONLY) 1100  Mobility Specialist Stop Time (ACUTE ONLY) 1110  Mobility Specialist Time Calculation (min) (ACUTE ONLY) 10 min   Pt received in bed, husband at bedside. Pt agreeable to ambulate after encouragement. MinA required to sit EOB, to stand, and to walk. Tolerated well, returned back to bed d/t dizziness. MD in room, left with all needs met, bed alarm on.   Feliciana Rossetti Mobility Specialist Please contact via Special educational needs teacher or  Rehab office at [phone]

## 2023-07-14 NOTE — Discharge Summary (Signed)
 Physician Discharge Summary  Maria [name] QMV:[id number] DOB: 1940-11-18 DOA: 07/09/2023  PCP: Joaquim Nam, MD  Admit date: 07/09/2023 Discharge date: 07/14/2023  Admitted From: Home Disposition:  Home  Recommendations for Outpatient Follow-up:  Follow up with GI in 6 weeks Please obtain BMP/CBC in one week   Home Health:No Equipment/Devices:none  Discharge Condition:Stable CODE STATUS:Full Diet recommendation: Heart Healthy   Brief/Interim Summary: 83 y.o. female past medical history of diabetes mellitus type 2, essential hypertension comes in today she started having trouble swallowing with solid foods   Discharge Diagnoses:  Principal Problem:   Difficulty swallowing solids Active Problems:   Multiple thyroid nodules  Odynophagia secondary to esophageal ulcer GI was consulted perform an EGD on 07/11/2023 that showed esophageal ulcer. Show started on sucralfate and PPI, due to her painful swallowing she is starting viscous gel she was able to tolerate her diet she had a bowel movement she was discharged in stable condition. Follow-up with GI in 6 to 8 weeks  Diabetes mellitus type 2: No change made to her medication.  Essential hypertension: No change made to her medication.  Discharge Instructions  Discharge Instructions     Diet - low sodium heart healthy   Complete by: As directed    Increase activity slowly   Complete by: As directed       Allergies as of 07/14/2023       Reactions   Crestor [rosuvastatin] Other (See Comments)   MYALGIAS   Ezetimibe-simvastatin Other (See Comments)   MYALGIAS   Pravastatin Other (See Comments)   Myalgias   Diazepam    Balance difficulty.     Lyrica [pregabalin]    swelling   Nsaids Other (See Comments)   UNSPECIFIED REACTION   Praluent [alirocumab]    myalgias   Repatha [evolocumab]    Hoarse voice   Acetaminophen Anxiety, Other (See Comments)   REACTION: jittery with plain tylenol, but tolerated  tylenol/benadryl combination   Carvedilol Other (See Comments)   nightmares   Gabapentin Diarrhea   Ibuprofen Other (See Comments)   REACTION: GI upset at high doses   Insulin Glargine Rash   Rash   Levemir [insulin Detemir] Rash   Macrobid [nitrofurantoin Macrocrystal] Nausea And Vomiting, Other (See Comments)   ACHES   Metformin Nausea And Vomiting   REACTION: GI upset   Metoprolol Tartrate Other (See Comments)   REACTION: nightmares   Naproxen Nausea And Vomiting   REACTION: GI upset        Medication List     STOP taking these medications    ibuprofen 200 MG tablet Commonly known as: Advil       TAKE these medications    aspirin EC 81 MG tablet Take 81 mg by mouth as needed for mild pain (pain score 1-3) or fever. Swallow whole.   cyanocobalamin 1000 MCG/ML injection Commonly known as: VITAMIN B12 1000 mcg injected IM every 14 days.   glyBURIDE 5 MG tablet Commonly known as: DIABETA Take 0.5 tablets (2.5 mg total) by mouth daily with breakfast.   lidocaine 2 % solution Commonly known as: XYLOCAINE Use as directed 15 mLs in the mouth or throat every 4 (four) hours as needed for mouth pain (failed cepacol).   losartan 25 MG tablet Commonly known as: Cozaar Take 1 tablet (25 mg total) by mouth daily.   Nexlizet 180-10 MG Tabs Generic drug: Bempedoic Acid-Ezetimibe Take 1 tablet by mouth daily.   nitroGLYCERIN 0.4 MG SL tablet Commonly known as:  NITROSTAT PLACE 1 TABLET UNDER THE TONGUE EVERY 5 MINUTES AS NEEDED FOR CHEST PAIN FOR UP TO 3 DOSES   omeprazole 20 MG capsule Commonly known as: PRILOSEC Take 1 capsule (20 mg total) by mouth 2 (two) times daily as needed (for acid reflux/indigestion.).   pantoprazole 40 MG tablet Commonly known as: PROTONIX Take 1 tablet (40 mg total) by mouth 2 (two) times daily.   sucralfate 1 GM/10ML suspension Commonly known as: CARAFATE Take 10 mLs (1 g total) by mouth 4 (four) times daily -  with meals and at  bedtime.   Vitamin D3 50 MCG (2000 UT) capsule Take 1 capsule (2,000 Units total) by mouth daily.        Follow-up Information     [facility]   Specialty: Home Health Services Contact information: [address] [place] [address] [place] [address] [phone]                Allergies  Allergen Reactions   Crestor [Rosuvastatin] Other (See Comments)    MYALGIAS   Ezetimibe-Simvastatin Other (See Comments)    MYALGIAS   Pravastatin Other (See Comments)    Myalgias   Diazepam     Balance difficulty.     Lyrica [Pregabalin]     swelling   Nsaids Other (See Comments)    UNSPECIFIED REACTION   Praluent [Alirocumab]     myalgias   Repatha [Evolocumab]     Hoarse voice   Acetaminophen Anxiety and Other (See Comments)    REACTION: jittery with plain tylenol, but tolerated tylenol/benadryl combination   Carvedilol Other (See Comments)    nightmares   Gabapentin Diarrhea   Ibuprofen Other (See Comments)    REACTION: GI upset at high doses   Insulin Glargine Rash    Rash   Levemir [Insulin Detemir] Rash   Macrobid [Nitrofurantoin Macrocrystal] Nausea And Vomiting and Other (See Comments)    ACHES   Metformin Nausea And Vomiting    REACTION: GI upset   Metoprolol Tartrate Other (See Comments)    REACTION: nightmares   Naproxen Nausea And Vomiting    REACTION: GI upset    Consultations: Gastroenterology   Procedures/Studies: CT Angio Chest PE W and/or Wo Contrast Result Date: 07/09/2023 CLINICAL DATA:  Chest pain radiating to back. EXAM: CT ANGIOGRAPHY CHEST WITH CONTRAST TECHNIQUE: Multidetector CT imaging of the chest was performed using the standard protocol during bolus administration of intravenous contrast. Multiplanar CT image reconstructions and MIPs were obtained to evaluate the vascular anatomy. RADIATION DOSE REDUCTION: This exam was performed according to the departmental dose-optimization program which includes automated exposure  control, adjustment of the mA and/or kV according to patient size and/or use of iterative reconstruction technique. CONTRAST:  57mL OMNIPAQUE IOHEXOL 350 MG/ML SOLN COMPARISON:  None Available. FINDINGS: Cardiovascular: No filling defects in the pulmonary arteries to suggest pulmonary emboli. Heart is normal size. Three-vessel coronary artery disease. No evidence of aortic aneurysm or dissection. Scattered aortic calcifications. Mediastinum/Nodes: No mediastinal, hilar, or axillary adenopathy. Trachea and esophagus are unremarkable. Thyroid unremarkable. Lungs/Pleura: Mosaic attenuation within the lungs compatible with areas of air trapping. No confluent opacities or effusions. Upper Abdomen: No acute findings Musculoskeletal: Chest wall soft tissues are unremarkable. No acute bony abnormality. Review of the MIP images confirms the above findings. IMPRESSION: No evidence of aortic aneurysm or XX chin. No evidence of pulmonary embolus. Coronary artery disease. Mosaic attenuation within the lungs compatible with areas of air trapping. No areas of consolidation.  Aortic Atherosclerosis (ICD10-I70.0). Electronically Signed   By: Charlett Nose M.D.   On: 07/09/2023 23:52   DG Chest Portable 1 View Result Date: 07/09/2023 CLINICAL DATA:  Chest pain starting 2 days ago. Nausea, vomiting, and weakness. EXAM: PORTABLE CHEST 1 VIEW COMPARISON:  11/13/2014 FINDINGS: Shallow inspiration. Heart size and pulmonary vascularity are normal for technique. Lungs are clear. No pleural effusion or pneumothorax. Mediastinal contours appear intact. Calcification of the aorta. Postoperative changes in the cervical spine. Lumbar scoliosis convex towards the right. A loop recorder is present. IMPRESSION: Shallow inspiration.  No evidence of active pulmonary disease. Electronically Signed   By: Burman Nieves M.D.   On: 07/09/2023 23:03   CUP PACEART REMOTE DEVICE CHECK Result Date: 06/26/2023 ILR summary report received. Battery status  OK. Normal device function. No new symptom, tachy, brady, or pause episodes. No new AF episodes. Monthly summary reports and ROV/PRN KS, CVRS  (Echo, Carotid, EGD, Colonoscopy, ERCP)    Subjective: Pain is improved  Discharge Exam: Vitals:   07/14/23 0354 07/14/23 0744  BP: (!) 151/71 (!) 149/68  Pulse: 89 (!) 107  Resp: 15 16  Temp: 98.4 F (36.9 C) 97.7 F (36.5 C)  SpO2: 96% 95%   Vitals:   07/13/23 1532 07/13/23 2000 07/14/23 0354 07/14/23 0744  BP: 138/60 (!) 145/73 (!) 151/71 (!) 149/68  Pulse: 89 95 89 (!) 107  Resp: 17 18 15 16   Temp: 97.6 F (36.4 C) 97.9 F (36.6 C) 98.4 F (36.9 C) 97.7 F (36.5 C)  TempSrc:  Oral Oral Oral  SpO2: 97% 96% 96% 95%  Weight:      Height:        General: Pt is alert, awake, not in acute distress Cardiovascular: RRR, S1/S2 +, no rubs, no gallops Respiratory: CTA bilaterally, no wheezing, no rhonchi Abdominal: Soft, NT, ND, bowel sounds + Extremities: no edema, no cyanosis    The results of significant diagnostics from this hospitalization (including imaging, microbiology, ancillary and laboratory) are listed below for reference.     Microbiology: Recent Results (from the past 240 hours)  Resp panel by RT-PCR (RSV, Flu A&B, Covid) Anterior Nasal Swab     Status: None   Collection Time: 07/09/23  9:55 PM   Specimen: Anterior Nasal Swab  Result Value Ref Range Status   SARS Coronavirus 2 by RT PCR NEGATIVE NEGATIVE Final   Influenza A by PCR NEGATIVE NEGATIVE Final   Influenza B by PCR NEGATIVE NEGATIVE Final    Comment: (NOTE) The Xpert Xpress SARS-CoV-2/FLU/RSV plus assay is intended as an aid in the diagnosis of influenza from Nasopharyngeal swab specimens and should not be used as a sole basis for treatment. Nasal washings and aspirates are unacceptable for Xpert Xpress SARS-CoV-2/FLU/RSV testing.  Fact Sheet for Patients: BloggerCourse.com  Fact Sheet for Healthcare  Providers: SeriousBroker.it  This test is not yet approved or cleared by the [place] FDA and has been authorized for detection and/or diagnosis of SARS-CoV-2 by FDA under an Emergency Use Authorization (EUA). This EUA will remain in effect (meaning this test can be used) for the duration of the COVID-19 declaration under Section 564(b)(1) of the Act, 21 U.S.C. section 360bbb-3(b)(1), unless the authorization is terminated or revoked.     Resp Syncytial Virus by PCR NEGATIVE NEGATIVE Final    Comment: (NOTE) Fact Sheet for Patients: BloggerCourse.com  Fact Sheet for Healthcare Providers: SeriousBroker.it  This test is not yet approved or cleared by the [place] FDA and has  been authorized for detection and/or diagnosis of SARS-CoV-2 by FDA under an Emergency Use Authorization (EUA). This EUA will remain in effect (meaning this test can be used) for the duration of the COVID-19 declaration under Section 564(b)(1) of the Act, 21 U.S.C. section 360bbb-3(b)(1), unless the authorization is terminated or revoked.  Performed at [facility], [address], [place], [place] [address]   Surgical pcr screen     Status: None   Collection Time: 07/11/23  6:12 AM   Specimen: Nasal Mucosa; Nasal Swab  Result Value Ref Range Status   MRSA, PCR NEGATIVE NEGATIVE Final   Staphylococcus aureus NEGATIVE NEGATIVE Final    Comment: (NOTE) The Xpert SA Assay (FDA approved for NASAL specimens in patients 45 years of age and older), is one component of a comprehensive surveillance program. It is not intended to diagnose infection nor to guide or monitor treatment. Performed at [facility], [address] [place], [place] [address]      Labs: BNP (last 3 results) No results for input(s): "BNP" in the last 8760 hours. Basic Metabolic Panel: Recent Labs  Lab 07/09/23 1631 07/10/23 0500  07/11/23 0708  NA 138 138 139  K 4.5 4.4 4.1  CL 103 104 106  CO2 19* 24 22  GLUCOSE 195* 154* 100*  BUN 26* 26* 16  CREATININE 1.09* 1.00 0.85  CALCIUM 9.8 9.2 9.0   Liver Function Tests: Recent Labs  Lab 07/09/23 1631  AST 19  ALT 15  ALKPHOS 72  BILITOT 0.9  PROT 7.3  ALBUMIN 3.9   Recent Labs  Lab 07/09/23 1631  LIPASE 28   No results for input(s): "AMMONIA" in the last 168 hours. CBC: Recent Labs  Lab 07/09/23 1631 07/11/23 0708  WBC 13.6* 7.3  NEUTROABS 11.0*  --   HGB 14.6 12.3  HCT 43.5 35.6*  MCV 91.2 89.4  PLT 433* 330   Cardiac Enzymes: No results for input(s): "CKTOTAL", "CKMB", "CKMBINDEX", "TROPONINI" in the last 168 hours. BNP: Invalid input(s): "POCBNP" CBG: Recent Labs  Lab 07/13/23 1218 07/13/23 1535 07/13/23 1737 07/13/23 2001 07/14/23 0819  GLUCAP 134* 122* 163* 139* 163*   D-Dimer No results for input(s): "DDIMER" in the last 72 hours. Hgb A1c No results for input(s): "HGBA1C" in the last 72 hours. Lipid Profile No results for input(s): "CHOL", "HDL", "LDLCALC", "TRIG", "CHOLHDL", "LDLDIRECT" in the last 72 hours. Thyroid function studies No results for input(s): "TSH", "T4TOTAL", "T3FREE", "THYROIDAB" in the last 72 hours.  Invalid input(s): "FREET3" Anemia work up No results for input(s): "VITAMINB12", "FOLATE", "FERRITIN", "TIBC", "IRON", "RETICCTPCT" in the last 72 hours. Urinalysis    Component Value Date/Time   COLORURINE YELLOW 01/13/2021 1707   APPEARANCEUR CLEAR 01/13/2021 1707   LABSPEC 1.025 01/13/2021 1707   PHURINE 6.0 01/13/2021 1707   GLUCOSEU NEGATIVE 01/13/2021 1707   HGBUR TRACE-INTACT (A) 01/13/2021 1707   BILIRUBINUR negative 04/14/2022 1150   KETONESUR NEGATIVE 01/13/2021 1707   PROTEINUR Positive (A) 04/14/2022 1150   PROTEINUR NEGATIVE 10/03/2018 1158   UROBILINOGEN 0.2 04/14/2022 1150   UROBILINOGEN 0.2 01/13/2021 1707   NITRITE positive 04/14/2022 1150   NITRITE NEGATIVE 01/13/2021 1707    LEUKOCYTESUR Small (1+) (A) 04/14/2022 1150   LEUKOCYTESUR MODERATE (A) 01/13/2021 1707   Sepsis Labs Recent Labs  Lab 07/09/23 1631 07/11/23 0708  WBC 13.6* 7.3   Microbiology Recent Results (from the past 240 hours)  Resp panel by RT-PCR (RSV, Flu A&B, Covid) Anterior Nasal Swab  Status: None   Collection Time: 07/09/23  9:55 PM   Specimen: Anterior Nasal Swab  Result Value Ref Range Status   SARS Coronavirus 2 by RT PCR NEGATIVE NEGATIVE Final   Influenza A by PCR NEGATIVE NEGATIVE Final   Influenza B by PCR NEGATIVE NEGATIVE Final    Comment: (NOTE) The Xpert Xpress SARS-CoV-2/FLU/RSV plus assay is intended as an aid in the diagnosis of influenza from Nasopharyngeal swab specimens and should not be used as a sole basis for treatment. Nasal washings and aspirates are unacceptable for Xpert Xpress SARS-CoV-2/FLU/RSV testing.  Fact Sheet for Patients: BloggerCourse.com  Fact Sheet for Healthcare Providers: SeriousBroker.it  This test is not yet approved or cleared by the [place] FDA and has been authorized for detection and/or diagnosis of SARS-CoV-2 by FDA under an Emergency Use Authorization (EUA). This EUA will remain in effect (meaning this test can be used) for the duration of the COVID-19 declaration under Section 564(b)(1) of the Act, 21 U.S.C. section 360bbb-3(b)(1), unless the authorization is terminated or revoked.     Resp Syncytial Virus by PCR NEGATIVE NEGATIVE Final    Comment: (NOTE) Fact Sheet for Patients: BloggerCourse.com  Fact Sheet for Healthcare Providers: SeriousBroker.it  This test is not yet approved or cleared by the [place] FDA and has been authorized for detection and/or diagnosis of SARS-CoV-2 by FDA under an Emergency Use Authorization (EUA). This EUA will remain in effect (meaning this test can be used) for the duration of  the COVID-19 declaration under Section 564(b)(1) of the Act, 21 U.S.C. section 360bbb-3(b)(1), unless the authorization is terminated or revoked.  Performed at [facility], [address] [place], [place] [address]   Surgical pcr screen     Status: None   Collection Time: 07/11/23  6:12 AM   Specimen: Nasal Mucosa; Nasal Swab  Result Value Ref Range Status   MRSA, PCR NEGATIVE NEGATIVE Final   Staphylococcus aureus NEGATIVE NEGATIVE Final    Comment: (NOTE) The Xpert SA Assay (FDA approved for NASAL specimens in patients 53 years of age and older), is one component of a comprehensive surveillance program. It is not intended to diagnose infection nor to guide or monitor treatment. Performed at [facility], [address] [place], [place] [address]      Time coordinating discharge: Over 35 minutes  SIGNED:   Marinda Elk, MD  [facility] 07/14/2023, 11:12 AM Pager   If 7PM-7AM, please contact night-coverage [email or web address] Password [email or web address]

## 2023-07-14 NOTE — Progress Notes (Signed)
 Maria [name] to be D/C'd  per MD order.  Discussed with the patient and husband [name] all questions fully answered.  VSS, Skin clean, dry and intact without evidence of skin break down, no evidence of skin tears noted.  IV catheter discontinued intact. Site without signs and symptoms of complications. Dressing and pressure applied.  An After Visit Summary was printed and given to the patient. Patient received prescription from [facility] pharmacy.  D/c education completed with patient/family including follow up instructions, medication list, d/c activities limitations if indicated, with other d/c instructions as indicated by MD - patient able to verbalize understanding, all questions fully answered.   Patient instructed to return to ED, call 911, or call MD for any changes in condition.   Patient to be escorted via WC, and D/C home via private auto.

## 2023-07-14 NOTE — Progress Notes (Signed)
 [facility] Gastroenterology Progress Note  AKSHITHA CULMER 83 y.o. [birth year]   Subjective: Eating minimal amounts of dysphagia 3 diet. Pain with swallowing minimal. Appetite slowly improving. Denies abdominal pain. Reports BM overnight. Husband in room.  Objective: Vital signs: Vitals:   07/14/23 0354 07/14/23 0744  BP: (!) 151/71 (!) 149/68  Pulse: 89 (!) 107  Resp: 15 16  Temp: 98.4 F (36.9 C) 97.7 F (36.5 C)  SpO2: 96% 95%    Physical Exam: Gen: alert, no acute distress, elderly, thin, pleasant HEENT: anicteric sclera CV: RRR Chest: CTA B Abd: soft, nontender, nondistended, +BS Ext: no edema  Lab Results: No results for input(s): "NA", "K", "CL", "CO2", "GLUCOSE", "BUN", "CREATININE", "CALCIUM", "MG", "PHOS" in the last 72 hours. No results for input(s): "AST", "ALT", "ALKPHOS", "BILITOT", "PROT", "ALBUMIN" in the last 72 hours. No results for input(s): "WBC", "NEUTROABS", "HGB", "HCT", "MCV", "PLT" in the last 72 hours.    Assessment/Plan: Odynophagia - ulcerative esophagitis with biopsies negative for CMV/HSV. PO intake improving. Strongly encouraged nutritional supplements BID at home. Ok to go home from GI standpoint on PPI BID and Carafate QID . F/U with Dr. Dulce Sellar in 6-8 weeks. Will sign off. Call if questions.   Shirley Friar 07/14/2023, 10:42 AM  Questions please call [phone] ID: Maria [name], female   DOB: Oct 26, 1940, 83 y.o.   MRN: [id number]

## 2023-07-15 ENCOUNTER — Encounter (HOSPITAL_COMMUNITY): Payer: Self-pay | Admitting: Gastroenterology

## 2023-07-16 ENCOUNTER — Telehealth: Payer: Self-pay

## 2023-07-16 NOTE — Transitions of Care (Post Inpatient/ED Visit) (Signed)
 07/16/2023  Name: Maria [name] MRN: [id number] DOB: 12/29/1940  Today's TOC FU Call Status: Today's TOC FU Call Status:: Successful TOC FU Call Completed TOC FU Call Complete Date: 07/16/23 Patient's Name and Date of Birth confirmed.  Transition Care Management Follow-up Telephone Call Date of Discharge: 07/14/23 Discharge Facility: [facility]) Type of Discharge: Inpatient Admission Primary Inpatient Discharge Diagnosis:: Esophagel Ulcer How have you been since you were released from the hospital?: Better Any questions or concerns?: Yes Patient Questions/Concerns:: I feel so weak Patient Questions/Concerns Addressed: Other: (Scheduled a PCP follow up visit)  Items Reviewed: Did you receive and understand the discharge instructions provided?: Yes Medications obtained,verified, and reconciled?: Yes (Medications Reviewed) Any new allergies since your discharge?: No Dietary orders reviewed?: Yes Type of Diet Ordered:: Soft diet then as tolerated Do you have support at home?: Yes People in Home: spouse Name of Support/Comfort Primary Source: Nadene Rubins  Medications Reviewed Today: Medications Reviewed Today     Reviewed by Redge Gainer, RN (Case Manager) on 07/16/23 at 1401  Med List Status: <None>   Medication Order Taking? Sig Documenting Provider Last Dose Status Informant  aspirin EC 81 MG tablet [id number]  Take 81 mg by mouth as needed for mild pain (pain score 1-3) or fever. Swallow whole. [provider]  Active Self, Pharmacy Records  Bempedoic Acid-Ezetimibe (NEXLIZET) 180-10 MG TABS [id number]  Take 1 tablet by mouth daily. Wendall Stade, MD  Active Self, Pharmacy Records  Cholecalciferol (VITAMIN D3) 50 MCG (2000 UT) capsule [id number]  Take 1 capsule (2,000 Units total) by mouth daily. Joaquim Nam, MD  Active Self, Pharmacy Records  cyanocobalamin (VITAMIN B12) 1000 MCG/ML injection [id number]  1000 mcg injected IM every 14 days. Joaquim Nam, MD  Active Self, Pharmacy Records           Med Note (LEE, NICOLE   Tue Jul 10, 2023  1:23 AM) Inject on Weds/Thurs every 2 weeks; Next injection due 06/20/2023  glyBURIDE (DIABETA) 5 MG tablet [id number]  Take 0.5 tablets (2.5 mg total) by mouth daily with breakfast. Joaquim Nam, MD  Active Self, Pharmacy Records  lidocaine (XYLOCAINE) 2 % solution [id number]  Use as directed 15 mLs in the mouth or throat every 4 (four) hours as needed for mouth pain (failed cepacol). Marinda Elk, MD  Active   losartan (COZAAR) 25 MG tablet [id number]  Take 1 tablet (25 mg total) by mouth daily. Wendall Stade, MD  Active Self, Pharmacy Records  nitroGLYCERIN (NITROSTAT) 0.4 MG SL tablet [id number]  PLACE 1 TABLET UNDER THE TONGUE EVERY 5 MINUTES AS NEEDED FOR CHEST PAIN FOR UP TO 3 DOSES  Patient not taking: Reported on 07/10/2023   Wendall Stade, MD  Active Self, Pharmacy Records           Med Note (LEE, NICOLE   Tue Jul 10, 2023  1:27 AM) Last dose unknown; Patient's Rx expired. Will need a new one upon discharge;  omeprazole (PRILOSEC) 20 MG capsule [id number]  Take 1 capsule (20 mg total) by mouth 2 (two) times daily as needed (for acid reflux/indigestion.). Joaquim Nam, MD  Active Self, Pharmacy Records  pantoprazole (PROTONIX) 40 MG tablet [id number]  Take 1 tablet (40 mg total) by mouth 2 (two) times daily. Marinda Elk, MD  Active   sucralfate (CARAFATE) 1 GM/10ML suspension [id number] No Take 10 mLs (1 g total) by mouth 4 (four) times daily -  with meals and  at bedtime.  Patient not taking: Reported on 07/16/2023   Marinda Elk, MD Not Taking Active             Home Care and Equipment/Supplies: Were Home Health Services Ordered?: Yes Name of Home Health Agency:: [facility] Has Agency set up a time to come to your home?: No EMR reviewed for Home Health Orders: Orders present/patient has not received call (refer to CM for follow-up) Any new equipment or medical  supplies ordered?: No  Functional Questionnaire: Do you need assistance with bathing/showering or dressing?: No Do you need assistance with meal preparation?: No Do you need assistance with eating?: No Do you have difficulty maintaining continence: No Do you need assistance with getting out of bed/getting out of a chair/moving?: No Do you have difficulty managing or taking your medications?: No  Follow up appointments reviewed: PCP Follow-up appointment confirmed?: Yes Date of PCP follow-up appointment?: 07/20/23 Follow-up Provider: Crawford Givens Specialist [facility] Follow-up appointment confirmed?: NA Do you need transportation to your follow-up appointment?: No Do you understand care options if your condition(s) worsen?: Yes-patient verbalized understanding  SDOH Interventions Today    Flowsheet Row Most Recent Value  SDOH Interventions   Food Insecurity Interventions Intervention Not Indicated  Housing Interventions Intervention Not Indicated  Transportation Interventions Intervention Not Indicated  Utilities Interventions Intervention Not Indicated      Interventions Today    Flowsheet Row Most Recent Value  General Interventions   General Interventions Discussed/Reviewed General Interventions Discussed, General Interventions Reviewed, Doctor Visits  Doctor Visits Discussed/Reviewed Doctor Visits Reviewed  Exercise Interventions   Exercise Discussed/Reviewed Assistive device use and maintanence  Education Interventions   Education Provided Provided Education  Provided Verbal Education On Insurance Plans, Medication, When to see the doctor  Nutrition Interventions   Nutrition Discussed/Reviewed Nutrition Discussed  Pharmacy Interventions   Pharmacy Dicussed/Reviewed Medications and their functions  Safety Interventions   Safety Discussed/Reviewed Fall Risk       TOC completed today. The patient states she is having a difficult time keeping food down and she throws  up Sucralfate. She states today is a better day and she thinks she will be alright. Discussed ways to keep hydrated and some soft foods that would be easier to swallow. She states she feels weak and needs a few days to rest. PCP appointment scheduled for today. RNCM to follow up next week for progress.   Deidre Ala, BSN, RN [place]  [facility] - [employer] [facility] RN Care Manager [phone]

## 2023-07-17 DIAGNOSIS — K2210 Ulcer of esophagus without bleeding: Secondary | ICD-10-CM | POA: Diagnosis not present

## 2023-07-17 DIAGNOSIS — D649 Anemia, unspecified: Secondary | ICD-10-CM | POA: Diagnosis not present

## 2023-07-17 DIAGNOSIS — I779 Disorder of arteries and arterioles, unspecified: Secondary | ICD-10-CM | POA: Diagnosis not present

## 2023-07-17 DIAGNOSIS — I452 Bifascicular block: Secondary | ICD-10-CM | POA: Diagnosis not present

## 2023-07-17 DIAGNOSIS — E1142 Type 2 diabetes mellitus with diabetic polyneuropathy: Secondary | ICD-10-CM | POA: Diagnosis not present

## 2023-07-17 DIAGNOSIS — R1319 Other dysphagia: Secondary | ICD-10-CM | POA: Diagnosis not present

## 2023-07-17 DIAGNOSIS — I471 Supraventricular tachycardia, unspecified: Secondary | ICD-10-CM | POA: Diagnosis not present

## 2023-07-17 DIAGNOSIS — I10 Essential (primary) hypertension: Secondary | ICD-10-CM | POA: Diagnosis not present

## 2023-07-17 DIAGNOSIS — I2510 Atherosclerotic heart disease of native coronary artery without angina pectoris: Secondary | ICD-10-CM | POA: Diagnosis not present

## 2023-07-18 ENCOUNTER — Telehealth: Payer: Self-pay | Admitting: Family Medicine

## 2023-07-18 NOTE — Telephone Encounter (Signed)
 Copied from CRM [id number]. Topic: Clinical - Home Health Verbal Orders >> Jul 18, 2023  8:03 AM Fredrich Romans wrote: Caller/Agency: [facility] Callback Number: [phone] Service Requested: Physical Therapy Frequency: 2w4 1wk3 Any new concerns about the patient? No

## 2023-07-18 NOTE — Telephone Encounter (Signed)
 Called [facility] and left voicemail advising that Dr. Para March is agreeable to physical therapy orders.

## 2023-07-18 NOTE — Telephone Encounter (Signed)
 Please give the order.  Thanks.

## 2023-07-20 ENCOUNTER — Encounter: Payer: Self-pay | Admitting: Family Medicine

## 2023-07-20 ENCOUNTER — Ambulatory Visit: Admitting: Family Medicine

## 2023-07-20 VITALS — BP 138/74 | HR 89 | Ht 66.0 in | Wt 140.0 lb

## 2023-07-20 DIAGNOSIS — M549 Dorsalgia, unspecified: Secondary | ICD-10-CM | POA: Diagnosis not present

## 2023-07-20 DIAGNOSIS — E538 Deficiency of other specified B group vitamins: Secondary | ICD-10-CM | POA: Diagnosis not present

## 2023-07-20 DIAGNOSIS — K2210 Ulcer of esophagus without bleeding: Secondary | ICD-10-CM

## 2023-07-20 LAB — BASIC METABOLIC PANEL
BUN: 15 mg/dL (ref 6–23)
CO2: 27 meq/L (ref 19–32)
Calcium: 9.8 mg/dL (ref 8.4–10.5)
Chloride: 103 meq/L (ref 96–112)
Creatinine, Ser: 0.94 mg/dL (ref 0.40–1.20)
GFR: 55.94 mL/min — ABNORMAL LOW (ref >60.00)
Glucose, Bld: 157 mg/dL — ABNORMAL HIGH (ref 70–99)
Potassium: 3.4 meq/L — ABNORMAL LOW (ref 3.5–5.1)
Sodium: 142 meq/L (ref 135–145)

## 2023-07-20 LAB — CBC WITH DIFFERENTIAL/PLATELET
Basophils Absolute: 0 10*3/uL (ref 0.0–0.1)
Basophils Relative: 0.3 % (ref 0.0–3.0)
Eosinophils Absolute: 0.2 10*3/uL (ref 0.0–0.7)
Eosinophils Relative: 1.8 % (ref 0.0–5.0)
HCT: 39.4 % (ref 36.0–46.0)
Hemoglobin: 13.7 g/dL (ref 12.0–15.0)
Lymphocytes Relative: 18.6 % (ref 12.0–46.0)
Lymphs Abs: 2 10*3/uL (ref 0.7–4.0)
MCHC: 34.7 g/dL (ref 30.0–36.0)
MCV: 90.1 fl (ref 78.0–100.0)
Monocytes Absolute: 0.6 10*3/uL (ref 0.1–1.0)
Monocytes Relative: 5.5 % (ref 3.0–12.0)
Neutro Abs: 8 10*3/uL — ABNORMAL HIGH (ref 1.4–7.7)
Neutrophils Relative %: 73.8 % (ref 43.0–77.0)
Platelets: 521 10*3/uL — ABNORMAL HIGH (ref 150.0–400.0)
RBC: 4.38 Mil/uL (ref 3.87–5.11)
RDW: 12.4 % (ref 11.5–15.5)
WBC: 10.8 10*3/uL — ABNORMAL HIGH (ref 4.0–10.5)

## 2023-07-20 MED ORDER — ACETAMINOPHEN 325 MG PO TABS: 325.0000 mg | ORAL_TABLET | Freq: Four times a day (QID) | ORAL | Status: AC | PRN

## 2023-07-20 MED ORDER — TRAMADOL HCL 50 MG PO TABS: 50.0000 mg | ORAL_TABLET | Freq: Four times a day (QID) | ORAL | 2 refills | Status: DC | PRN

## 2023-07-20 MED ORDER — CYANOCOBALAMIN 1000 MCG/ML IJ SOLN
1000.0000 ug | Freq: Once | INTRAMUSCULAR | Status: AC
  Administered 2023-07-20: 1000 ug via INTRAMUSCULAR

## 2023-07-20 NOTE — Patient Instructions (Addendum)
 Tylenol as needed for back pain. Update me as needed.  Then can use tramadol if needed, sedation caution.  Go to the lab on the way out.   If you have mychart we'll likely use that to update you.    Take care.  Glad to see you.

## 2023-07-20 NOTE — Progress Notes (Signed)
 Per orders of Dr. Crawford Givens, injection of B-12 given by Leonor Liv in right deltoid. Patient tolerated injection well. Patient will make appointment for 1

## 2023-07-20 NOTE — Progress Notes (Signed)
 Due for B12 shot. Done at OV.  Off lidocaine aspirin and nsaids.  Inpatient course d/w pt.   Admit date: 07/09/2023 Discharge date: 07/14/2023   Admitted From: Home Disposition:  Home   Recommendations for Outpatient Follow-up:  Follow up with GI in 6 weeks Please obtain BMP/CBC in one week   Brief/Interim Summary: 83 y.o. female past medical history of diabetes mellitus type 2, essential hypertension comes in today she started having trouble swallowing with solid foods    Discharge Diagnoses:  Principal Problem:   Difficulty swallowing solids Active Problems:   Multiple thyroid nodules   Odynophagia secondary to esophageal ulcer GI was consulted perform an EGD on 07/11/2023 that showed esophageal ulcer. Show started on sucralfate and PPI, due to her painful swallowing she is starting viscous gel she was able to tolerate her diet she had a bowel movement she was discharged in stable condition. Follow-up with GI in 6 to 8 weeks   Diabetes mellitus type 2: No change made to her medication.   Essential hypertension: No change made to her medication.  ==================================== Swallowing is better. D/w pt about GI f/u.  Referral placed. No black or bloody stools.  No abd pain.   Discussed her back pain at baseline.  Would avoid nsaids.  D/w pt about taking tylenol, d/w pt about retrial.    Discussed grief re: loss of son, sleep disruption from recent illness, pain, and possible effect on her memory and mood.    Meds, vitals, and allergies reviewed.   ROS: Per HPI unless specifically indicated in ROS section   GEN: nad, alert and oriented HEENT: ncat NECK: supple w/o LA CV: rrr.  PULM: ctab, no inc wob ABD: soft, +bs EXT: no edema SKIN:well perfused.   30 minutes were devoted to patient care in this encounter (this includes time spent reviewing the patient's file/history, interviewing and examining the patient, counseling/reviewing plan with patient).

## 2023-07-22 DIAGNOSIS — K2210 Ulcer of esophagus without bleeding: Secondary | ICD-10-CM | POA: Insufficient documentation

## 2023-07-22 NOTE — Assessment & Plan Note (Signed)
 Tylenol as needed for back pain. Update me as needed.  Then can use tramadol if needed, sedation caution.  Update me as needed .

## 2023-07-22 NOTE — Assessment & Plan Note (Signed)
 Dose given at OV.

## 2023-07-22 NOTE — Assessment & Plan Note (Signed)
 Avoid nsaids, see notes on labs.  Refer to GI.  Continue PPI and sucralfate.

## 2023-07-23 ENCOUNTER — Other Ambulatory Visit: Payer: Self-pay

## 2023-07-23 NOTE — Patient Outreach (Signed)
 Care Coordination   Follow Up Visit Note   07/23/2023 Name: Maria [name] MRN: [id number] DOB: [birth year]  Maria [name] is a 83 y.o. year old female who sees Joaquim Nam, MD for primary care. I spoke with  Maria [name] by phone today.  What matters to the patients health and wellness today?  Improved swallowing    Goals Addressed             This Visit's Progress    care coordination activities - no further follow up needed.       Interventions Today    Flowsheet Row Most Recent Value  General Interventions   General Interventions Discussed/Reviewed General Interventions Discussed  [Care coordination program/ services discussed. SDOH survey completed.  Discussed AWV. Discussed importance in keeping vaccines up to date.  Advised to contact primary care provider office if care coordination services needed in the future.]             Reached out to the patient after her PCP visit for an update. The patient states she is swallowing better, has an improved appetite and has resolved her GI problems. She was referred to call GI for a follow up but she declined.  SDOH assessments and interventions completed:  Yes     Care Coordination Interventions:  Yes, provided   Follow up plan: No further intervention required.   Encounter Outcome:  Patient Visit Completed

## 2023-07-24 ENCOUNTER — Telehealth: Payer: Self-pay

## 2023-07-24 NOTE — Telephone Encounter (Signed)
 Copied from CRM [id number]. Topic: Clinical - Home Health Verbal Orders >> Jul 24, 2023  3:44 PM Adele Barthel wrote: Caller/Agency: [facility] Callback Number: [phone] Service Requested: Marchelle Folks is calling in to report patient missed physical therapy appt today due to declining services. Marchelle Folks will come back later in the week and try to initiate therapy. Frequency: n/a Any new concerns about the patient? No

## 2023-07-25 NOTE — Telephone Encounter (Signed)
 Noted. Thanks.

## 2023-07-29 DIAGNOSIS — R1319 Other dysphagia: Secondary | ICD-10-CM | POA: Diagnosis not present

## 2023-07-29 DIAGNOSIS — M5030 Other cervical disc degeneration, unspecified cervical region: Secondary | ICD-10-CM

## 2023-07-29 DIAGNOSIS — I10 Essential (primary) hypertension: Secondary | ICD-10-CM | POA: Diagnosis not present

## 2023-07-29 DIAGNOSIS — I779 Disorder of arteries and arterioles, unspecified: Secondary | ICD-10-CM | POA: Diagnosis not present

## 2023-07-29 DIAGNOSIS — I471 Supraventricular tachycardia, unspecified: Secondary | ICD-10-CM | POA: Diagnosis not present

## 2023-07-29 DIAGNOSIS — K2210 Ulcer of esophagus without bleeding: Secondary | ICD-10-CM | POA: Diagnosis not present

## 2023-07-29 DIAGNOSIS — I452 Bifascicular block: Secondary | ICD-10-CM | POA: Diagnosis not present

## 2023-07-29 DIAGNOSIS — F32A Depression, unspecified: Secondary | ICD-10-CM

## 2023-07-29 DIAGNOSIS — D649 Anemia, unspecified: Secondary | ICD-10-CM | POA: Diagnosis not present

## 2023-07-29 DIAGNOSIS — M51369 Other intervertebral disc degeneration, lumbar region without mention of lumbar back pain or lower extremity pain: Secondary | ICD-10-CM

## 2023-07-29 DIAGNOSIS — E1142 Type 2 diabetes mellitus with diabetic polyneuropathy: Secondary | ICD-10-CM | POA: Diagnosis not present

## 2023-07-29 DIAGNOSIS — I2510 Atherosclerotic heart disease of native coronary artery without angina pectoris: Secondary | ICD-10-CM | POA: Diagnosis not present

## 2023-07-30 ENCOUNTER — Ambulatory Visit: Payer: Medicare PPO

## 2023-07-30 ENCOUNTER — Telehealth: Payer: Self-pay

## 2023-07-30 DIAGNOSIS — I639 Cerebral infarction, unspecified: Secondary | ICD-10-CM | POA: Diagnosis not present

## 2023-07-30 NOTE — Progress Notes (Signed)
 Carelink Summary Report / Loop Recorder

## 2023-07-30 NOTE — Addendum Note (Signed)
 Addended by: Geralyn Flash D on: 07/30/2023 01:04 PM   Modules accepted: Orders

## 2023-07-30 NOTE — Telephone Encounter (Signed)
Noted.  I'll defer to patient.  Thanks.  

## 2023-07-30 NOTE — Telephone Encounter (Signed)
 Copied from CRM [id number]. Topic: Clinical - Home Health Verbal Orders >> Jul 27, 2023  4:58 PM Myrtice Lauth wrote: Caller/Agency: Dorothy[facility] Gibbs Number: [phone] Service Requested: Physical Therapy Frequency:  Any new concerns about the patient? Yes Called to advise pt has refused PT services

## 2023-07-31 LAB — CUP PACEART REMOTE DEVICE CHECK
Date Time Interrogation Session: 20250316230433
Implantable Pulse Generator Implant Date: 20220407

## 2023-08-06 ENCOUNTER — Ambulatory Visit (INDEPENDENT_AMBULATORY_CARE_PROVIDER_SITE_OTHER): Payer: Medicare PPO

## 2023-08-06 VITALS — BP 138/74 | Ht 66.0 in | Wt 140.0 lb

## 2023-08-06 DIAGNOSIS — Z5320 Procedure and treatment not carried out because of patient's decision for unspecified reasons: Secondary | ICD-10-CM | POA: Diagnosis not present

## 2023-08-06 DIAGNOSIS — Z0000 Encounter for general adult medical examination without abnormal findings: Secondary | ICD-10-CM | POA: Diagnosis not present

## 2023-08-06 DIAGNOSIS — E1159 Type 2 diabetes mellitus with other circulatory complications: Secondary | ICD-10-CM | POA: Diagnosis not present

## 2023-08-06 DIAGNOSIS — Z2821 Immunization not carried out because of patient refusal: Secondary | ICD-10-CM | POA: Diagnosis not present

## 2023-08-06 NOTE — Patient Instructions (Signed)
 Ms. Kruckenberg , Thank you for taking time to come for your Medicare Wellness Visit. I appreciate your ongoing commitment to your health goals. Please review the following plan we discussed and let me know if I can assist you in the future.   Referrals/Orders/Follow-Ups/Clinician Recommendations:   This is a list of the screening recommended for you and due dates:  Health Maintenance  Topic Date Due   Eye exam for diabetics  11/23/2022   Complete foot exam   05/23/2023   Yearly kidney health urinalysis for diabetes  05/30/2023   Zoster (Shingles) Vaccine (2 of 2) 07/12/2023   Colon Cancer Screening  08/14/2023*   Hemoglobin A1C  11/12/2023   Yearly kidney function blood test for diabetes  07/19/2024   Medicare Annual Wellness Visit  08/05/2024   DTaP/Tdap/Td vaccine (4 - Td or Tdap) 02/23/2027   Pneumonia Vaccine  Completed   Flu Shot  Completed   DEXA scan (bone density measurement)  Completed   HPV Vaccine  Aged Out   COVID-19 Vaccine  Discontinued  *Topic was postponed. The date shown is not the original due date.    Advanced directives: (Copy Requested) Please bring a copy of your health care power of attorney and living will to the office to be added to your chart at your convenience. You can mail to [facility] [address] [place], [place] [address] or email to [email or web address]  Next Medicare Annual Wellness Visit scheduled for next year: Yes

## 2023-08-06 NOTE — Progress Notes (Signed)
 Because this visit was a virtual/telehealth visit,  certain criteria was not obtained, such a blood pressure, CBG if applicable, and timed get up and go. Any medications not marked as "taking" were not mentioned during the medication reconciliation part of the visit. Any vitals not documented were not able to be obtained due to this being a telehealth visit or patient was unable to self-report a recent blood pressure reading due to a lack of equipment at home via telehealth. Vitals that have been documented are verbally provided by the patient.  Subjective:   Maria [name] is a 83 y.o. who presents for a Medicare Wellness preventive visit.  Visit Complete: Virtual I connected with  Maria [name] on 08/06/23 by a audio enabled telemedicine application and verified that I am speaking with the correct person using two identifiers.  Patient Location: Home  Provider Location: Home Office  I discussed the limitations of evaluation and management by telemedicine. The patient expressed understanding and agreed to proceed.  Vital Signs: Because this visit was a virtual/telehealth visit, some criteria may be missing or patient reported. Any vitals not documented were not able to be obtained and vitals that have been documented are patient reported.  VideoDeclined- This patient declined Librarian, academic. Therefore the visit was completed with audio only.  Persons Participating in Visit: Patient.  AWV Questionnaire: No: Patient Medicare AWV questionnaire was not completed prior to this visit.  Cardiac Risk Factors include: advanced age (>72men, >24 women);diabetes mellitus;dyslipidemia;hypertension     Objective:    Today's Vitals   08/06/23 1317 08/06/23 1318  BP: 138/74   Weight: 140 lb (63.5 kg)   Height: 5\' 6"  (1.676 m)   PainSc:  5    Body mass index is 22.6 kg/m.     08/06/2023    1:16 PM 07/10/2023   12:17 PM 07/09/2023    3:10 PM 08/02/2022   10:37 AM  07/26/2021    1:19 PM 08/19/2020   12:07 PM 08/18/2020    8:32 PM  Advanced Directives  Does Patient Have a Medical Advance Directive? Yes Yes Yes Yes Yes Yes Yes  Type of Estate agent of [place];Living will Healthcare Power of [place];Living will Healthcare Power of [place];Living will Healthcare Power of [place];Living will Healthcare Power of [place];Living will Living will Living will  Does patient want to make changes to medical advance directive? No - Patient declined No - Patient declined No - Patient declined  Yes (MAU/Ambulatory/Procedural Areas - Information given) Yes (Inpatient - patient defers changing a medical advance directive at this time - Information given)   Copy of Healthcare Power of Attorney in Chart? No - copy requested No - copy requested  No - copy requested     Would patient like information on creating a medical advance directive?      No - Patient declined     Current Medications (verified) Outpatient Encounter Medications as of 08/06/2023  Medication Sig   acetaminophen (TYLENOL) 325 MG tablet Take 1-2 tablets (325-650 mg total) by mouth every 6 (six) hours as needed.   Bempedoic Acid-Ezetimibe (NEXLIZET) 180-10 MG TABS Take 1 tablet by mouth daily.   Cholecalciferol (VITAMIN D3) 50 MCG (2000 UT) capsule Take 1 capsule (2,000 Units total) by mouth daily.   cyanocobalamin (VITAMIN B12) 1000 MCG/ML injection 1000 mcg injected IM every 14 days.   glyBURIDE (DIABETA) 5 MG tablet Take 0.5 tablets (2.5 mg total) by mouth daily with breakfast.   losartan (COZAAR)  25 MG tablet Take 1 tablet (25 mg total) by mouth daily.   nitroGLYCERIN (NITROSTAT) 0.4 MG SL tablet PLACE 1 TABLET UNDER THE TONGUE EVERY 5 MINUTES AS NEEDED FOR CHEST PAIN FOR UP TO 3 DOSES   omeprazole (PRILOSEC) 20 MG capsule Take 1 capsule (20 mg total) by mouth 2 (two) times daily as needed (for acid reflux/indigestion.).   pantoprazole (PROTONIX) 40 MG tablet Take 1 tablet (40 mg  total) by mouth 2 (two) times daily.   sucralfate (CARAFATE) 1 GM/10ML suspension Take 10 mLs (1 g total) by mouth 4 (four) times daily -  with meals and at bedtime.   traMADol (ULTRAM) 50 MG tablet Take 1 tablet (50 mg total) by mouth every 6 (six) hours as needed.   No facility-administered encounter medications on file as of 08/06/2023.    Allergies (verified) Crestor [rosuvastatin], Ezetimibe-simvastatin, Pravastatin, Diazepam, Lyrica [pregabalin], Nsaids, Praluent [alirocumab], Repatha [evolocumab], Carvedilol, Gabapentin, Ibuprofen, Insulin glargine, Levemir [insulin detemir], Macrobid [nitrofurantoin macrocrystal], Metformin, Metoprolol tartrate, and Naproxen   History: Past Medical History:  Diagnosis Date   Anemia 05/1985   Carotid artery disease (HCC)    a. 02/2015 - 1-39% bilaterally.   Chronic back pain    Concussion    Coronary artery disease    a. s/p DES to [facility] 01/2015.   DDD (degenerative disc disease), cervical    DDD (degenerative disc disease), lumbar    Fracture of lower leg 07/2001   Right   GERD (gastroesophageal reflux disease)    Hyperlipidemia    Hypertension    Multinodular thyroid    Pneumonia ?1990's X 1   PSVT (paroxysmal supraventricular tachycardia) (HCC)    a. s/p catheter ablation of a parahisian atrial tachycardia which was successfully ablated from the non-coronary cusp of the aortic root 06/2014.   RBBB with left anterior fascicular block    Renal cyst 07/10/2006   bilateral   Right knee pain 02/2010   Injected - Dr. Shelle Iron   Sinus pause    a. By event monitoring 09/2014 - BB stopped at that time.   Statin intolerance    Stroke [facility])    Syncope and collapse 07/2001   "that's when I broke my leg"   Type II diabetes mellitus (HCC) 2002   Past Surgical History:  Procedure Laterality Date   ANTERIOR CERVICAL DECOMP/DISCECTOMY FUSION  10/03/2005   C4/5; C5/6  "it's got a plate in there"   ANTERIOR LAT LUMBAR FUSION Left 02/04/2018   Procedure:  Lumbar two-three Lumbar three-four Lumbar four-five  Anterolateral decompression/percutaneous posterior arthrodesis, [place];  Surgeon: Barnett Abu, MD;  Location: [facility] OR;  Service: Neurosurgery;  Laterality: Left;   APPLICATION OF ROBOTIC ASSISTANCE FOR SPINAL PROCEDURE N/A 02/04/2018   Procedure: APPLICATION OF ROBOTIC ASSISTANCE FOR SPINAL PROCEDURE;  Surgeon: Barnett Abu, MD;  Location: [facility] OR;  Service: Neurosurgery;  Laterality: N/A;   BACK SURGERY     BIOPSY  07/11/2023   Procedure: BIOPSY;  Surgeon: Willis Modena, MD;  Location: [facility] ENDOSCOPY;  Service: Gastroenterology;;   BREAST BIOPSY Bilateral 1968 (multiple)   "all benign"   BREAST LUMPECTOMY Bilateral 1968   CARDIAC CATHETERIZATION N/A 11/18/2014   Procedure: Left Heart Cath and Coronary Angiography;  Surgeon: Peter M Swaziland, MD;  Location: [facility] INVASIVE CV LAB;  Service: Cardiovascular;  Laterality: N/A;   CARDIAC CATHETERIZATION N/A 01/22/2015   Procedure: Coronary Stent Intervention;  Surgeon: Peter M Swaziland, MD;  Location: [facility] INVASIVE CV LAB;  Service: Cardiovascular;  Laterality: N/A;  CARDIAC CATHETERIZATION N/A 06/10/2015   Procedure: Left Heart Cath and Coronary Angiography;  Surgeon: Peter M Swaziland, MD;  Location: [facility] INVASIVE CV LAB;  Service: Cardiovascular;  Laterality: N/A;   CATARACT EXTRACTION W/ INTRAOCULAR LENS  IMPLANT, BILATERAL Bilateral 12/2010   CORONARY ANGIOPLASTY     ESOPHAGOGASTRODUODENOSCOPY  08/14/2005   gastropathy biopsy, negative   ESOPHAGOGASTRODUODENOSCOPY (EGD) WITH PROPOFOL Left 07/11/2023   Procedure: ESOPHAGOGASTRODUODENOSCOPY (EGD) WITH PROPOFOL;  Surgeon: Willis Modena, MD;  Location: [facility] ENDOSCOPY;  Service: Gastroenterology;  Laterality: Left;   LAPAROSCOPIC CHOLECYSTECTOMY  08/1992   LOOP RECORDER INSERTION N/A 08/19/2020   Procedure: LOOP RECORDER INSERTION;  Surgeon: Marinus Maw, MD;  Location: [facility] INVASIVE CV LAB;  Service: Cardiovascular;  Laterality: N/A;   LUMBAR PERCUTANEOUS PEDICLE SCREW  3 LEVEL N/A 02/04/2018   Procedure: LUMBAR PERCUTANEOUS PEDICLE SCREW LUMBAR TWO - LUMBAR FIVE;  Surgeon: Barnett Abu, MD;  Location: [facility] OR;  Service: Neurosurgery;  Laterality: N/A;   POSTERIOR LAMINECTOMY / DECOMPRESSION LUMBAR SPINE  09/2001   due to herniated disc   SUPRAVENTRICULAR TACHYCARDIA ABLATION N/A 07/06/2014   Procedure: SUPRAVENTRICULAR TACHYCARDIA ABLATION;  Surgeon: Marinus Maw, MD;  Location: [facility] CATH LAB;  Service: Cardiovascular;  Laterality: N/A;   VAGINAL HYSTERECTOMY  1968   spotting   Family History  Problem Relation Age of Onset   Hypertension Mother    Heart disease Mother    Diabetes Sister    Myasthenia gravis Sister    Diabetes Brother        1/2 juvenile; foot ulcer; periph. neuropathy   Heart disease Maternal Grandfather        MI   Hypertension Maternal Grandfather    Depression Paternal Aunt    Hypertension Maternal Grandmother    Diabetes Maternal Grandmother    Stroke Maternal Grandmother    Hypertension Paternal Grandmother    Diabetes Paternal Grandmother    Hypertension Paternal Grandfather    Diabetes Paternal Grandfather    Breast cancer Maternal Aunt    Colon cancer Neg Hx    Social History   Socioeconomic History   Marital status: Married    Spouse name: Not on file   Number of children: 2   Years of education: Not on file   Highest education level: Not on file  Occupational History   Occupation: Retired; Hotel manager, [employer]    Employer: RETIRED    Comment: Works a couple days a week  Tobacco Use   Smoking status: Never   Smokeless tobacco: Never  Vaping Use   Vaping status: Never Used  Substance and Sexual Activity   Alcohol use: No    Alcohol/week: 0.0 standard drinks of alcohol   Drug use: No   Sexual activity: Yes  Other Topics Concern   Not on file  Social History Narrative   Her son died 03-Sep-2021.        Widowed 09/03/09 after 52 years of marriage (husband had C.diff)   Remarried 09/03/13   Right  handed   One story home   Occasional caffeine   Social Drivers of Corporate investment banker Strain: Low Risk  (08/02/2022)   Overall Financial Resource Strain (CARDIA)    Difficulty of Paying Living Expenses: Not hard at all  Food Insecurity: No Food Insecurity (08/06/2023)   Hunger Vital Sign    Worried About Running Out of Food in the Last Year: Never true    Ran Out of Food in the Last Year: Never true  Transportation Needs: No Transportation Needs (08/06/2023)   PRAPARE - Administrator, Civil Service (Medical): No    Lack of Transportation (Non-Medical): No  Physical Activity: Insufficiently Active (08/06/2023)   Exercise Vital Sign    Days of Exercise per Week: 2 days    Minutes of Exercise per Session: 30 min  Stress: No Stress Concern Present (08/06/2023)   [employer] of Occupational Health - Occupational Stress Questionnaire    Feeling of Stress : Not at all  Social Connections: Moderately Integrated (08/06/2023)   Social Connection and Isolation Panel [NHANES]    Frequency of Communication with Friends and Family: More than three times a week    Frequency of Social Gatherings with Friends and Family: More than three times a week    Attends Religious Services: More than 4 times per year    Active Member of [employer] or Organizations: No    Attends Engineer, structural: Never    Marital Status: Married    Tobacco Counseling Counseling given: Not Answered    Clinical Intake:  Pre-visit preparation completed: Yes  Pain : 0-10 Pain Score: 5  Pain Type: Chronic pain Pain Location: Back Pain Orientation: Right, Left, Lower Pain Descriptors / Indicators: Constant Pain Onset: Today Pain Frequency: Rarely Pain Relieving Factors: pain medication  Pain Relieving Factors: pain medication  BMI - recorded: 22.6 Nutritional Status: BMI <19  Underweight Nutritional Risks: None Diabetes: Yes CBG done?: No Did pt. bring in CBG monitor from home?:  No  Lab Results  Component Value Date   HGBA1C 6.0 (A) 05/14/2023   HGBA1C 6.2 (A) 02/08/2023   HGBA1C 6.0 (A) 11/07/2022     How often do you need to have someone help you when you read instructions, pamphlets, or other written materials from your doctor or pharmacy?: 1 - Never  Interpreter Needed?: No  Information entered by :: Estell Harpin   Activities of Daily Living     08/06/2023    1:26 PM 07/10/2023    5:18 PM  In your present state of health, do you have any difficulty performing the following activities:  Hearing? 0 0  Vision? 0 0  Difficulty concentrating or making decisions? 0 0  Walking or climbing stairs? 0   Dressing or bathing? 0   Doing errands, shopping? 0   Preparing Food and eating ? N   Using the Toilet? N   In the past six months, have you accidently leaked urine? Y   Do you have problems with loss of bowel control? N   Managing your Medications? N   Managing your Finances? N   Housekeeping or managing your Housekeeping? N     Patient Care Team: Joaquim Nam, MD as PCP - General (Family Medicine) Wendall Stade, MD as PCP - Cardiology (Cardiology) Barnett Abu, MD as Consulting Physician (Neurosurgery) Sinda Du, MD as Consulting Physician (Ophthalmology) Kathyrn Sheriff, [facility] (Inactive) as Pharmacist (Pharmacist)  Indicate any recent Medical Services you may have received from other than [facility] providers in the past year (date may be approximate).     Assessment:   This is a routine wellness examination for [place].  Hearing/Vision screen Hearing Screening - Comments:: No hearing issues Vision Screening - Comments:: No difficulty seeing   Goals Addressed               This Visit's Progress     Patient Stated (pt-stated)        Patient wants to stay  safe        Depression Screen     02/08/2023   10:00 AM 01/18/2023   12:39 PM 11/07/2022   10:04 AM 09/12/2022   10:35 AM 08/02/2022   10:34 AM 07/26/2021     1:25 PM 05/03/2021   11:32 AM  PHQ 2/9 Scores  PHQ - 2 Score 1 1 2 3  0 0 0  PHQ- 9 Score 3 3 5 7   1     Fall Risk     08/06/2023    1:25 PM 02/08/2023   10:00 AM 01/18/2023   12:39 PM 11/07/2022   10:06 AM 09/12/2022   10:34 AM  Fall Risk   Falls in the past year? 1 0 0 1 1  Number falls in past yr: 0 0 0 0 0  Injury with Fall? 0 0 0 0 0  Risk for fall due to : History of fall(s) No Fall Risks History of fall(s);Impaired balance/gait Impaired balance/gait History of fall(s);Impaired balance/gait  Follow up Falls evaluation completed Falls evaluation completed Falls evaluation completed Falls evaluation completed Falls evaluation completed    MEDICARE RISK AT HOME:  Medicare Risk at Home Any stairs in or around the home?: Yes If so, are there any without handrails?: No Home free of loose throw rugs in walkways, pet beds, electrical cords, etc?: Yes Adequate lighting in your home to reduce risk of falls?: Yes Life alert?: Yes Use of a cane, walker or w/c?: Yes (cane and walker) Grab bars in the bathroom?: Yes Shower chair or bench in shower?: Yes Elevated toilet seat or a handicapped toilet?: Yes  TIMED UP AND GO:  Was the test performed?  No  Cognitive Function: 6CIT completed    08/14/2019    3:21 PM 07/05/2016    3:07 PM 07/05/2016    3:04 PM  MMSE - Mini Mental State Exam  Orientation to time 5 5 5   Orientation to Place 5 5 5   Registration 3 3 3   Attention/ Calculation 5 0 0  Recall 3 3 3   Language- name 2 objects  0 0  Language- repeat 1 1 1   Language- follow 3 step command  3 3  Language- read & follow direction  0 0  Write a sentence  0 0  Copy design  0 0  Total score  20 20        08/06/2023    1:21 PM 08/02/2022   10:39 AM  6CIT Screen  What Year? 0 points 0 points  What month? 0 points 0 points  What time? 0 points 0 points  Count back from 20 0 points 0 points  Months in reverse 0 points 0 points  Repeat phrase 4 points 4 points  Total Score 4  points 4 points    Immunizations Immunization History  Administered Date(s) Administered   Fluad Quad(high Dose 65+) 05/03/2021, 05/22/2022   Fluad Trivalent(High Dose 65+) 01/18/2023   Influenza Split 02/22/2012   Influenza Whole 02/21/2006, 02/13/2007, 02/07/2008, 02/17/2009, 02/25/2010   Influenza, High Dose Seasonal PF 03/01/2017   Influenza,inj,Quad PF,6+ Mos 01/23/2015, 03/29/2016, 04/23/2018, 01/09/2019   Influenza,inj,quad, With Preservative 02/12/2017   Influenza-Unspecified 02/26/2014, 02/13/2016   PFIZER(Purple Top)SARS-COV-2 Vaccination 07/10/2019, 07/30/2019   Pneumococcal Conjugate-13 12/18/2013   Pneumococcal Polysaccharide-23 07/17/2000, 02/11/2015   Pneumococcal-Unspecified 07/14/2015   Td 05/15/1996, 01/15/2008   Tdap 02/22/2017   Zoster Recombinant(Shingrix) 05/17/2023   Zoster, Live 10/02/2011    Screening Tests Health Maintenance  Topic Date Due   OPHTHALMOLOGY EXAM  11/23/2022   FOOT EXAM  05/23/2023   Diabetic kidney evaluation - Urine ACR  05/30/2023   Zoster Vaccines- Shingrix (2 of 2) 07/12/2023   Colonoscopy  08/14/2023 (Originally 04/08/2017)   HEMOGLOBIN A1C  11/12/2023   Diabetic kidney evaluation - eGFR measurement  07/19/2024   Medicare Annual Wellness (AWV)  08/05/2024   DTaP/Tdap/Td (4 - Td or Tdap) 02/23/2027   Pneumonia Vaccine 67+ Years old  Completed   INFLUENZA VACCINE  Completed   DEXA SCAN  Completed   HPV VACCINES  Aged Out   COVID-19 Vaccine  Discontinued    Health Maintenance  Health Maintenance Due  Topic Date Due   OPHTHALMOLOGY EXAM  11/23/2022   FOOT EXAM  05/23/2023   Diabetic kidney evaluation - Urine ACR  05/30/2023   Zoster Vaccines- Shingrix (2 of 2) 07/12/2023   Health Maintenance Items Addressed: Diabetic Foot Exam scheduled, UACR (Urine Albumin:Creatinine Ratio) patient states she will get her eye exam and shingles vaccine soon  Additional Screening:  Vision Screening: Recommended annual ophthalmology  exams for early detection of glaucoma and other disorders of the eye.  Dental Screening: Recommended annual dental exams for proper oral hygiene  Community Resource Referral / Chronic Care Management: CRR required this visit?  No   CCM required this visit?  No     Plan:     I have personally reviewed and noted the following in the patient's chart:   Medical and social history Use of alcohol, tobacco or illicit drugs  Current medications and supplements including opioid prescriptions. Patient is not currently taking opioid prescriptions. Functional ability and status Nutritional status Physical activity Advanced directives List of other physicians Hospitalizations, surgeries, and ER visits in previous 12 months Vitals Screenings to include cognitive, depression, and falls Referrals and appointments  In addition, I have reviewed and discussed with patient certain preventive protocols, quality metrics, and best practice recommendations. A written personalized care plan for preventive services as well as general preventive health recommendations were provided to patient.     Rudi Heap, [place]   08/06/2023   After Visit Summary: (MyChart) Due to this being a telephonic visit, the after visit summary with patients personalized plan was offered to patient via MyChart   Notes: Nothing significant to report at this time.

## 2023-08-08 DIAGNOSIS — I779 Disorder of arteries and arterioles, unspecified: Secondary | ICD-10-CM | POA: Diagnosis not present

## 2023-08-08 DIAGNOSIS — D649 Anemia, unspecified: Secondary | ICD-10-CM | POA: Diagnosis not present

## 2023-08-08 DIAGNOSIS — E1142 Type 2 diabetes mellitus with diabetic polyneuropathy: Secondary | ICD-10-CM | POA: Diagnosis not present

## 2023-08-08 DIAGNOSIS — I2510 Atherosclerotic heart disease of native coronary artery without angina pectoris: Secondary | ICD-10-CM | POA: Diagnosis not present

## 2023-08-08 DIAGNOSIS — I452 Bifascicular block: Secondary | ICD-10-CM | POA: Diagnosis not present

## 2023-08-08 DIAGNOSIS — I10 Essential (primary) hypertension: Secondary | ICD-10-CM | POA: Diagnosis not present

## 2023-08-08 DIAGNOSIS — R1319 Other dysphagia: Secondary | ICD-10-CM | POA: Diagnosis not present

## 2023-08-08 DIAGNOSIS — K2210 Ulcer of esophagus without bleeding: Secondary | ICD-10-CM | POA: Diagnosis not present

## 2023-08-08 DIAGNOSIS — I471 Supraventricular tachycardia, unspecified: Secondary | ICD-10-CM | POA: Diagnosis not present

## 2023-08-16 DIAGNOSIS — R1319 Other dysphagia: Secondary | ICD-10-CM | POA: Diagnosis not present

## 2023-08-16 DIAGNOSIS — I452 Bifascicular block: Secondary | ICD-10-CM | POA: Diagnosis not present

## 2023-08-16 DIAGNOSIS — E1142 Type 2 diabetes mellitus with diabetic polyneuropathy: Secondary | ICD-10-CM | POA: Diagnosis not present

## 2023-08-16 DIAGNOSIS — D649 Anemia, unspecified: Secondary | ICD-10-CM | POA: Diagnosis not present

## 2023-08-16 DIAGNOSIS — I10 Essential (primary) hypertension: Secondary | ICD-10-CM | POA: Diagnosis not present

## 2023-08-16 DIAGNOSIS — I471 Supraventricular tachycardia, unspecified: Secondary | ICD-10-CM | POA: Diagnosis not present

## 2023-08-16 DIAGNOSIS — K2210 Ulcer of esophagus without bleeding: Secondary | ICD-10-CM | POA: Diagnosis not present

## 2023-08-16 DIAGNOSIS — I779 Disorder of arteries and arterioles, unspecified: Secondary | ICD-10-CM | POA: Diagnosis not present

## 2023-08-16 DIAGNOSIS — I2510 Atherosclerotic heart disease of native coronary artery without angina pectoris: Secondary | ICD-10-CM | POA: Diagnosis not present

## 2023-08-27 DIAGNOSIS — I2510 Atherosclerotic heart disease of native coronary artery without angina pectoris: Secondary | ICD-10-CM | POA: Diagnosis not present

## 2023-08-27 DIAGNOSIS — K2210 Ulcer of esophagus without bleeding: Secondary | ICD-10-CM | POA: Diagnosis not present

## 2023-08-27 DIAGNOSIS — I452 Bifascicular block: Secondary | ICD-10-CM | POA: Diagnosis not present

## 2023-08-27 DIAGNOSIS — E1142 Type 2 diabetes mellitus with diabetic polyneuropathy: Secondary | ICD-10-CM | POA: Diagnosis not present

## 2023-08-27 DIAGNOSIS — I779 Disorder of arteries and arterioles, unspecified: Secondary | ICD-10-CM | POA: Diagnosis not present

## 2023-08-27 DIAGNOSIS — I471 Supraventricular tachycardia, unspecified: Secondary | ICD-10-CM | POA: Diagnosis not present

## 2023-08-27 DIAGNOSIS — D649 Anemia, unspecified: Secondary | ICD-10-CM | POA: Diagnosis not present

## 2023-08-27 DIAGNOSIS — I10 Essential (primary) hypertension: Secondary | ICD-10-CM | POA: Diagnosis not present

## 2023-08-27 DIAGNOSIS — R1319 Other dysphagia: Secondary | ICD-10-CM | POA: Diagnosis not present

## 2023-09-03 ENCOUNTER — Ambulatory Visit: Payer: Medicare PPO

## 2023-09-03 DIAGNOSIS — I639 Cerebral infarction, unspecified: Secondary | ICD-10-CM | POA: Diagnosis not present

## 2023-09-04 LAB — CUP PACEART REMOTE DEVICE CHECK
Date Time Interrogation Session: 20250420230447
Implantable Pulse Generator Implant Date: 20220407

## 2023-09-10 ENCOUNTER — Encounter: Payer: Self-pay | Admitting: Family Medicine

## 2023-09-10 ENCOUNTER — Ambulatory Visit: Payer: Medicare PPO | Admitting: Family Medicine

## 2023-09-10 VITALS — BP 134/72 | HR 88 | Temp 98.7°F | Ht 62.91 in | Wt 142.0 lb

## 2023-09-10 DIAGNOSIS — Z7189 Other specified counseling: Secondary | ICD-10-CM

## 2023-09-10 DIAGNOSIS — Z0000 Encounter for general adult medical examination without abnormal findings: Secondary | ICD-10-CM

## 2023-09-10 DIAGNOSIS — E538 Deficiency of other specified B group vitamins: Secondary | ICD-10-CM | POA: Diagnosis not present

## 2023-09-10 DIAGNOSIS — E782 Mixed hyperlipidemia: Secondary | ICD-10-CM | POA: Diagnosis not present

## 2023-09-10 DIAGNOSIS — M810 Age-related osteoporosis without current pathological fracture: Secondary | ICD-10-CM | POA: Diagnosis not present

## 2023-09-10 DIAGNOSIS — M549 Dorsalgia, unspecified: Secondary | ICD-10-CM

## 2023-09-10 DIAGNOSIS — I10 Essential (primary) hypertension: Secondary | ICD-10-CM

## 2023-09-10 DIAGNOSIS — E1159 Type 2 diabetes mellitus with other circulatory complications: Secondary | ICD-10-CM | POA: Diagnosis not present

## 2023-09-10 LAB — HEMOGLOBIN A1C: Hgb A1c MFr Bld: 5.9 % (ref 4.6–6.5)

## 2023-09-10 LAB — LIPID PANEL
Cholesterol: 144 mg/dL (ref 0–200)
HDL: 46.5 mg/dL (ref >39.00)
LDL Cholesterol: 77 mg/dL (ref 0–99)
NonHDL: 97.43
Total CHOL/HDL Ratio: 3
Triglycerides: 104 mg/dL (ref 0.0–149.0)
VLDL: 20.8 mg/dL (ref 0.0–40.0)

## 2023-09-10 LAB — TSH: TSH: 1.31 u[IU]/mL (ref 0.35–5.50)

## 2023-09-10 LAB — VITAMIN D 25 HYDROXY (VIT D DEFICIENCY, FRACTURES): VITD: 17.02 ng/mL — ABNORMAL LOW (ref 30.00–100.00)

## 2023-09-10 LAB — VITAMIN B12: Vitamin B-12: 329 pg/mL (ref 211–911)

## 2023-09-10 MED ORDER — CYANOCOBALAMIN 1000 MCG/ML IJ SOLN
1000.0000 ug | Freq: Once | INTRAMUSCULAR | Status: AC
  Administered 2023-09-10: 1000 ug via INTRAMUSCULAR

## 2023-09-10 NOTE — Patient Instructions (Addendum)
 Go to the lab on the way out.   If you have mychart we'll likely use that to update you.    B12 shot today.  Cut glyburide  back to 1/2 tab in the AM.  Ask the front about updating your contact information and designated party release.   Take care.  Glad to see you.

## 2023-09-10 NOTE — Progress Notes (Unsigned)
 B12 def, due for dose and recheck labs.  See notes on labs.   Diabetes:  Using medications without difficulties: yes Hypoglycemic episodes: rare episode where she feels the need to take a snack.   Hyperglycemic episodes: no Feet problems: some change in sensation since back surgery.   Blood Sugars averaging: usually ~80 per patient report, she forgot her log today.  Not checked often given consistency.   eye exam within last year: likely due, d/w pt.   Labs pending.   Hypertension:    Using medication without problems or lightheadedness: yes Chest pain with exertion:no Edema:no Short of breath: only with sig exertion.    Elevated Cholesterol: Using medications without problems: yes Muscle aches: not from med.  Diet compliance: d/w pt.  Exercise: d/w pt.    No NTG use or need.    Taking tylenol  vs tramadol  for back pain at baseline.   Memory d/w pt.  She is making more notes. Here today with daughter.  D/w pt about sleep, death of son, pain contributing to memory changes.  She is still actively reading and enjoys that.  She can recall details from novels, etc.  She still balances her checkbook.  Oriented x 3.  Can read a watch and do math.  3/3 attention and recall.    Shingles prev done PNA prev done.  Tetanus 2018 Flu prev done.  Covid prev done.  RSV d/w pt.   Colon and pap not due.  Mammogram scheduling d/w pt.  DXA deferred, she wanted to decline tx.    Daughter [name] designated if patient were incapacitated.  Then if needed, grandson [name] designated.  Carmyn Spoonamore is not involved in decisions per patient request.    Patient only wants information released to [place] and [place] if needed, not [employer].    PMH and SH reviewed.   Vital signs, Meds and allergies reviewed.  ROS: Per HPI unless specifically indicated in ROS section   GEN: nad, alert and oriented HEENT: mucous membranes moist NECK: supple w/o LA CV: rrr PULM: ctab, no inc wob ABD:  soft, +bs EXT: no edema SKIN: well perfused.   Diabetic foot exam: Normal inspection No skin breakdown No calluses  Normal DP pulses Normal sensation to light tough and monofilament Nails normal  43 minutes were devoted to patient care in this encounter (this includes time spent reviewing the patient's file/history, interviewing and examining the patient, counseling/reviewing plan with patient).

## 2023-09-10 NOTE — Progress Notes (Unsigned)
 Per orders of Dr. Richrd Char, injection of B-12 given by Eller Gut in right deltoid. Patient tolerated injection well. P

## 2023-09-11 ENCOUNTER — Ambulatory Visit: Admitting: Family Medicine

## 2023-09-12 NOTE — Assessment & Plan Note (Signed)
 Daughter Maria [name] designated if patient were incapacitated.  Then if needed, grandson Maria [name] designated.  Maria [name] is not involved in decisions per patient request.    Patient only wants information released to [place] and [place] if needed, not [employer].

## 2023-09-12 NOTE — Addendum Note (Signed)
 Addended by: Edra Govern D on: 09/12/2023 04:00 PM   Modules accepted: Orders

## 2023-09-12 NOTE — Assessment & Plan Note (Signed)
 Continue losartan.

## 2023-09-12 NOTE — Assessment & Plan Note (Signed)
 See notes on labs.

## 2023-09-12 NOTE — Assessment & Plan Note (Signed)
 Goal to avoid lows.  D/w pt.  Cut glyburide  back to 1/2 tab in the AM.

## 2023-09-12 NOTE — Progress Notes (Signed)
 Carelink Summary Report / Loop Recorder

## 2023-09-12 NOTE — Assessment & Plan Note (Signed)
 Taking tylenol  vs tramadol  for back pain at baseline.  Continue as is.

## 2023-09-12 NOTE — Assessment & Plan Note (Signed)
 Continue nexlizet . See notes on labs.

## 2023-09-12 NOTE — Assessment & Plan Note (Signed)
 Shingles prev done PNA prev done.  Tetanus 2018 Flu prev done.  Covid prev done.  RSV d/w pt.   Colon and pap not due.  Mammogram scheduling d/w pt.  DXA deferred, she wanted to decline tx.    Daughter [name] designated if patient were incapacitated.  Then if needed, grandson [name] designated.  [name] is not involved in decisions per patient request.    Patient only wants information released to [place] and [place] if needed, not [employer].

## 2023-09-16 ENCOUNTER — Other Ambulatory Visit: Payer: Self-pay | Admitting: Family Medicine

## 2023-09-16 DIAGNOSIS — M810 Age-related osteoporosis without current pathological fracture: Secondary | ICD-10-CM

## 2023-09-16 DIAGNOSIS — E1159 Type 2 diabetes mellitus with other circulatory complications: Secondary | ICD-10-CM

## 2023-09-16 MED ORDER — VITAMIN D (ERGOCALCIFEROL) 1.25 MG (50000 UNIT) PO CAPS: 50000.0000 [IU] | ORAL_CAPSULE | ORAL | 0 refills | Status: DC

## 2023-10-04 ENCOUNTER — Ambulatory Visit (INDEPENDENT_AMBULATORY_CARE_PROVIDER_SITE_OTHER)

## 2023-10-04 DIAGNOSIS — I639 Cerebral infarction, unspecified: Secondary | ICD-10-CM

## 2023-10-04 LAB — CUP PACEART REMOTE DEVICE CHECK
Date Time Interrogation Session: 20250521230728
Implantable Pulse Generator Implant Date: 20220407

## 2023-10-07 ENCOUNTER — Ambulatory Visit: Payer: Self-pay | Admitting: Internal Medicine

## 2023-10-15 ENCOUNTER — Telehealth: Payer: Self-pay

## 2023-10-15 NOTE — Telephone Encounter (Signed)
 Copied from CRM [id number]. Topic: Clinical - Medication Question >> Oct 15, 2023  9:35 AM Adonis Hoot wrote: Reason for CRM: Patient called in stating that she has been working in the yard,and her back is really hurting her.She stated that Dr Vallarie Gauze calls in oxycodone  for her when she need it  and would like to know if he could call her in a few for her back?  [facility] - [place], [place] - [address]  Phone: [phone] Fax: [phone]

## 2023-10-15 NOTE — Telephone Encounter (Signed)
 Has she been taking tramadol ?  Did that help at all?    Does the pain radiate?  What details can you get? Please let me know.   Thanks.

## 2023-10-16 MED ORDER — OXYCODONE HCL 5 MG PO TABS: 5.0000 mg | ORAL_TABLET | Freq: Four times a day (QID) | ORAL | 0 refills | Status: DC | PRN

## 2023-10-16 NOTE — Telephone Encounter (Signed)
 Patient advises that she did take tramadol  on yesterday but she can't say that it helps or not. She is sure that the rods in her back that causes the pain. The pain radiates from her back down through her leg. When she feels this way that is when the oxycodone  helps and not the tramadol .

## 2023-10-16 NOTE — Telephone Encounter (Signed)
 Patient notified

## 2023-10-16 NOTE — Addendum Note (Signed)
 Addended by: Donnie Galea on: 10/16/2023 09:07 AM   Modules accepted: Orders

## 2023-10-16 NOTE — Telephone Encounter (Signed)
 I sent the oxycodone  rx.  If that isn't helping, then please get checked in clinic.  Thanks.

## 2023-10-19 NOTE — Addendum Note (Signed)
 Addended by: Edra Govern D on: 10/19/2023 02:58 PM   Modules accepted: Orders

## 2023-10-19 NOTE — Progress Notes (Signed)
 Carelink Summary Report / Loop Recorder

## 2023-10-29 ENCOUNTER — Other Ambulatory Visit: Payer: Self-pay | Admitting: Family Medicine

## 2023-10-29 NOTE — Telephone Encounter (Signed)
 LOV:09/10/23 ZOX:[id number] scheduled LAST REFILL: oxyCODONE  (OXY IR/ROXICODONE ) 5 MG immediate release tablet 10/16/23 20 tablets 0 refills

## 2023-10-29 NOTE — Telephone Encounter (Unsigned)
 Copied from CRM [id number]. Topic: Clinical - Medication Refill >> Oct 29, 2023  9:45 AM [place] A wrote: Medication: oxyCODONE  (OXY IR/ROXICODONE ) 5 MG immediate release tablet  Has the patient contacted their pharmacy? Yes (Agent: If no, request that the patient contact the pharmacy for the refill. If patient does not wish to contact the pharmacy document the reason why and proceed with request.) (Agent: If yes, when and what did the pharmacy advise?) Call Primary  This is the patient's preferred pharmacy:  [facility] - [place], [place] - [address] [place] [address] Phone: [phone] Fax: [phone]   Is this the correct pharmacy for this prescription? Yes If no, delete pharmacy and type the correct one.   Has the prescription been filled recently? No  Is the patient out of the medication? Yes  Has the patient been seen for an appointment in the last year OR does the patient have an upcoming appointment? Yes  Can we respond through MyChart? No  Agent: Please be advised that Rx refills may take up to 3 business days. We ask that you follow-up with your pharmacy.

## 2023-10-30 MED ORDER — OXYCODONE HCL 5 MG PO TABS: 5.0000 mg | ORAL_TABLET | Freq: Four times a day (QID) | ORAL | 0 refills | Status: DC | PRN

## 2023-10-30 NOTE — Telephone Encounter (Signed)
 Sent. Thanks.  Please get update on patient re: pain.

## 2023-10-31 NOTE — Telephone Encounter (Signed)
 Reached out to patient and she states that the 5mg  does not help. It just takes the edge off a little bit. She blames herself, but she just feels like she just needs to be out in the yard a little. But she is appreciative of the call.

## 2023-10-31 NOTE — Telephone Encounter (Signed)
 Please offer OV if pain is continuing so we can talk about options.  Thanks.

## 2023-11-05 ENCOUNTER — Ambulatory Visit (INDEPENDENT_AMBULATORY_CARE_PROVIDER_SITE_OTHER)

## 2023-11-05 DIAGNOSIS — I639 Cerebral infarction, unspecified: Secondary | ICD-10-CM | POA: Diagnosis not present

## 2023-11-05 LAB — CUP PACEART REMOTE DEVICE CHECK
Date Time Interrogation Session: 20250622231123
Implantable Pulse Generator Implant Date: 20220407

## 2023-11-05 NOTE — Telephone Encounter (Signed)
 Pt declines appt at this point, will call back if she changes his mind.

## 2023-11-07 ENCOUNTER — Ambulatory Visit: Payer: Self-pay | Admitting: Internal Medicine

## 2023-11-13 NOTE — Progress Notes (Signed)
 Carelink Summary Report / Loop Recorder

## 2023-11-27 ENCOUNTER — Other Ambulatory Visit: Payer: Self-pay | Admitting: Family Medicine

## 2023-12-06 ENCOUNTER — Ambulatory Visit

## 2023-12-06 DIAGNOSIS — I639 Cerebral infarction, unspecified: Secondary | ICD-10-CM

## 2023-12-06 LAB — CUP PACEART REMOTE DEVICE CHECK
Date Time Interrogation Session: 20250723230920
Implantable Pulse Generator Implant Date: 20220407

## 2023-12-09 ENCOUNTER — Ambulatory Visit: Payer: Self-pay | Admitting: Internal Medicine

## 2023-12-10 ENCOUNTER — Other Ambulatory Visit: Payer: Self-pay | Admitting: Family Medicine

## 2023-12-10 ENCOUNTER — Telehealth: Payer: Self-pay | Admitting: Family Medicine

## 2023-12-10 MED ORDER — OXYCODONE HCL 5 MG PO TABS: 5.0000 mg | ORAL_TABLET | Freq: Four times a day (QID) | ORAL | 0 refills | Status: DC | PRN

## 2023-12-10 NOTE — Telephone Encounter (Unsigned)
 Copied from CRM [id number]. Topic: Clinical - Medication Refill >> Dec 10, 2023  2:29 PM [name] wrote: Medication: oxyCODONE  (OXY IR/ROXICODONE ) 5 MG immediate release tablet  Has the patient contacted their pharmacy? Yes (Agent: If no, request that the patient contact the pharmacy for the refill. If patient does not wish to contact the pharmacy document the reason why and proceed with request.) (Agent: If yes, when and what did the pharmacy advise?)  This is the patient's preferred pharmacy:  [facility] - [place], [place] - [address] [name] [address] Phone: [phone] Fax: [phone]   Is this the correct pharmacy for this prescription? Yes If no, delete pharmacy and type the correct one.   Has the prescription been filled recently? Yes  Is the patient out of the medication? Yes  Has the patient been seen for an appointment in the last year OR does the patient have an upcoming appointment? Yes  Can we respond through MyChart? Yes  Agent: Please be advised that Rx refills may take up to 3 business days. We ask that you follow-up with your pharmacy.

## 2023-12-10 NOTE — Telephone Encounter (Signed)
  Reached out to patient about her back pain. She states that she is doing horrible. She is requesting oxycodone  until she is able to make an appointment to see Dr. Colon. She can barely get any sleep because she is in a high amount of pain.

## 2023-12-10 NOTE — Addendum Note (Signed)
 Addended by: CLEATUS ARLYSS RAMAN on: 12/10/2023 11:43 PM   Modules accepted: Orders

## 2023-12-10 NOTE — Telephone Encounter (Deleted)
 Returned call to patient he is adamant about not going to the ER. I did give him the number to [facility] he saw Dr. Charmayne there in the past. He said he will keep his appointment with Cleatus on 8/5 he is on the wait list. I did stress the importance of going to ER but he declined twice.

## 2023-12-10 NOTE — Telephone Encounter (Deleted)
 Disregard previous message

## 2023-12-10 NOTE — Telephone Encounter (Signed)
 I printed the rx when I couldn't get it to transmit through the EMR.  I finally got it to go through.  Please shred the printed copy.   Let me know if she can't get set up with Dr. Colon.  Thanks.

## 2023-12-10 NOTE — Telephone Encounter (Signed)
 Please check with patient about back pain.  Maria [name] reported she was having more pain.  We didn't discuss her case, other than him reporting that to me.    Thanks.

## 2023-12-10 NOTE — Progress Notes (Signed)
 Carelink Summary Report / Loop Recorder

## 2023-12-11 NOTE — Telephone Encounter (Signed)
 Left detailed voicemail for patient to call the office back if there are any concerns or questions.

## 2023-12-12 NOTE — Telephone Encounter (Signed)
 Prescription has been sent.

## 2023-12-13 ENCOUNTER — Other Ambulatory Visit: Payer: Self-pay

## 2023-12-13 ENCOUNTER — Encounter (HOSPITAL_COMMUNITY): Payer: Self-pay

## 2023-12-13 ENCOUNTER — Emergency Department (HOSPITAL_COMMUNITY)

## 2023-12-13 ENCOUNTER — Emergency Department (HOSPITAL_COMMUNITY): Admission: EM | Admit: 2023-12-13 | Discharge: 2023-12-13 | Disposition: A | Discharge status: Home / Self Care

## 2023-12-13 DIAGNOSIS — Y9301 Activity, walking, marching and hiking: Secondary | ICD-10-CM | POA: Diagnosis not present

## 2023-12-13 DIAGNOSIS — I700 Atherosclerosis of aorta: Secondary | ICD-10-CM | POA: Diagnosis not present

## 2023-12-13 DIAGNOSIS — S5001XA Contusion of right elbow, initial encounter: Secondary | ICD-10-CM | POA: Diagnosis not present

## 2023-12-13 DIAGNOSIS — G319 Degenerative disease of nervous system, unspecified: Secondary | ICD-10-CM | POA: Insufficient documentation

## 2023-12-13 DIAGNOSIS — S3993XA Unspecified injury of pelvis, initial encounter: Secondary | ICD-10-CM | POA: Diagnosis not present

## 2023-12-13 DIAGNOSIS — S022XXA Fracture of nasal bones, initial encounter for closed fracture: Secondary | ICD-10-CM | POA: Insufficient documentation

## 2023-12-13 DIAGNOSIS — M47814 Spondylosis without myelopathy or radiculopathy, thoracic region: Secondary | ICD-10-CM | POA: Insufficient documentation

## 2023-12-13 DIAGNOSIS — M25562 Pain in left knee: Secondary | ICD-10-CM | POA: Diagnosis not present

## 2023-12-13 DIAGNOSIS — I119 Hypertensive heart disease without heart failure: Secondary | ICD-10-CM | POA: Diagnosis not present

## 2023-12-13 DIAGNOSIS — T1490XA Injury, unspecified, initial encounter: Secondary | ICD-10-CM | POA: Diagnosis not present

## 2023-12-13 DIAGNOSIS — R319 Hematuria, unspecified: Secondary | ICD-10-CM | POA: Diagnosis not present

## 2023-12-13 DIAGNOSIS — N390 Urinary tract infection, site not specified: Secondary | ICD-10-CM | POA: Insufficient documentation

## 2023-12-13 DIAGNOSIS — Z981 Arthrodesis status: Secondary | ICD-10-CM | POA: Diagnosis not present

## 2023-12-13 DIAGNOSIS — M51369 Other intervertebral disc degeneration, lumbar region without mention of lumbar back pain or lower extremity pain: Secondary | ICD-10-CM | POA: Diagnosis not present

## 2023-12-13 DIAGNOSIS — M25521 Pain in right elbow: Secondary | ICD-10-CM | POA: Diagnosis not present

## 2023-12-13 DIAGNOSIS — S299XXA Unspecified injury of thorax, initial encounter: Secondary | ICD-10-CM | POA: Diagnosis not present

## 2023-12-13 DIAGNOSIS — Z043 Encounter for examination and observation following other accident: Secondary | ICD-10-CM | POA: Diagnosis not present

## 2023-12-13 DIAGNOSIS — Z8673 Personal history of transient ischemic attack (TIA), and cerebral infarction without residual deficits: Secondary | ICD-10-CM | POA: Diagnosis not present

## 2023-12-13 DIAGNOSIS — I6782 Cerebral ischemia: Secondary | ICD-10-CM | POA: Diagnosis not present

## 2023-12-13 DIAGNOSIS — W19XXXA Unspecified fall, initial encounter: Secondary | ICD-10-CM | POA: Diagnosis not present

## 2023-12-13 DIAGNOSIS — Z79899 Other long term (current) drug therapy: Secondary | ICD-10-CM | POA: Diagnosis not present

## 2023-12-13 DIAGNOSIS — S0993XA Unspecified injury of face, initial encounter: Secondary | ICD-10-CM | POA: Diagnosis present

## 2023-12-13 DIAGNOSIS — S3991XA Unspecified injury of abdomen, initial encounter: Secondary | ICD-10-CM | POA: Diagnosis not present

## 2023-12-13 DIAGNOSIS — E119 Type 2 diabetes mellitus without complications: Secondary | ICD-10-CM | POA: Diagnosis not present

## 2023-12-13 DIAGNOSIS — M1712 Unilateral primary osteoarthritis, left knee: Secondary | ICD-10-CM | POA: Diagnosis not present

## 2023-12-13 DIAGNOSIS — M549 Dorsalgia, unspecified: Secondary | ICD-10-CM | POA: Diagnosis not present

## 2023-12-13 DIAGNOSIS — W010XXA Fall on same level from slipping, tripping and stumbling without subsequent striking against object, initial encounter: Secondary | ICD-10-CM | POA: Diagnosis not present

## 2023-12-13 DIAGNOSIS — Z7984 Long term (current) use of oral hypoglycemic drugs: Secondary | ICD-10-CM | POA: Insufficient documentation

## 2023-12-13 DIAGNOSIS — Y92002 Bathroom of unspecified non-institutional (private) residence single-family (private) house as the place of occurrence of the external cause: Secondary | ICD-10-CM | POA: Insufficient documentation

## 2023-12-13 DIAGNOSIS — N281 Cyst of kidney, acquired: Secondary | ICD-10-CM | POA: Insufficient documentation

## 2023-12-13 DIAGNOSIS — M4185 Other forms of scoliosis, thoracolumbar region: Secondary | ICD-10-CM | POA: Diagnosis not present

## 2023-12-13 DIAGNOSIS — I2510 Atherosclerotic heart disease of native coronary artery without angina pectoris: Secondary | ICD-10-CM | POA: Insufficient documentation

## 2023-12-13 LAB — COMPREHENSIVE METABOLIC PANEL WITH GFR
ALT: 8 U/L (ref 0–44)
AST: 20 U/L (ref 15–41)
Albumin: 3.9 g/dL (ref 3.5–5.0)
Alkaline Phosphatase: 74 U/L (ref 38–126)
Anion gap: 10 (ref 5–15)
BUN: 21 mg/dL (ref 8–23)
CO2: 23 mmol/L (ref 22–32)
Calcium: 9.2 mg/dL (ref 8.9–10.3)
Chloride: 106 mmol/L (ref 98–111)
Creatinine, Ser: 1.01 mg/dL — ABNORMAL HIGH (ref 0.44–1.00)
GFR, Estimated: 55 mL/min — ABNORMAL LOW (ref >60)
Glucose, Bld: 138 mg/dL — ABNORMAL HIGH (ref 70–99)
Potassium: 4.6 mmol/L (ref 3.5–5.1)
Sodium: 139 mmol/L (ref 135–145)
Total Bilirubin: 1 mg/dL (ref 0.0–1.2)
Total Protein: 6.3 g/dL — ABNORMAL LOW (ref 6.5–8.1)

## 2023-12-13 LAB — URINALYSIS, ROUTINE W REFLEX MICROSCOPIC
Bilirubin Urine: NEGATIVE
Glucose, UA: NEGATIVE mg/dL
Ketones, ur: NEGATIVE mg/dL
Nitrite: POSITIVE — AB
Protein, ur: NEGATIVE mg/dL
Specific Gravity, Urine: 1.028 (ref 1.005–1.030)
pH: 6 (ref 5.0–8.0)

## 2023-12-13 LAB — I-STAT CHEM 8, ED
BUN: 23 mg/dL (ref 8–23)
Calcium, Ion: 1.22 mmol/L (ref 1.15–1.40)
Chloride: 106 mmol/L (ref 98–111)
Creatinine, Ser: 1 mg/dL (ref 0.44–1.00)
Glucose, Bld: 148 mg/dL — ABNORMAL HIGH (ref 70–99)
HCT: 39 % (ref 36.0–46.0)
Hemoglobin: 13.3 g/dL (ref 12.0–15.0)
Potassium: 4.7 mmol/L (ref 3.5–5.1)
Sodium: 140 mmol/L (ref 135–145)
TCO2: 24 mmol/L (ref 22–32)

## 2023-12-13 LAB — CBC
HCT: 37.2 % (ref 36.0–46.0)
Hemoglobin: 12.4 g/dL (ref 12.0–15.0)
MCH: 31.2 pg (ref 26.0–34.0)
MCHC: 33.3 g/dL (ref 30.0–36.0)
MCV: 93.5 fL (ref 80.0–100.0)
Platelets: 345 K/uL (ref 150–400)
RBC: 3.98 MIL/uL (ref 3.87–5.11)
RDW: 12.8 % (ref 11.5–15.5)
WBC: 8.6 K/uL (ref 4.0–10.5)
nRBC: 0 % (ref 0.0–0.2)

## 2023-12-13 LAB — I-STAT CG4 LACTIC ACID, ED: Lactic Acid, Venous: 0.9 mmol/L (ref 0.5–1.9)

## 2023-12-13 MED ORDER — ONDANSETRON HCL 4 MG/2ML IJ SOLN
4.0000 mg | Freq: Once | INTRAMUSCULAR | Status: AC
  Administered 2023-12-13: 4 mg via INTRAVENOUS
  Filled 2023-12-13: qty 2

## 2023-12-13 MED ORDER — HYDROCODONE-ACETAMINOPHEN 5-325 MG PO TABS: 1.0000 | ORAL_TABLET | Freq: Four times a day (QID) | ORAL | 0 refills | Status: DC | PRN

## 2023-12-13 MED ORDER — FENTANYL CITRATE PF 50 MCG/ML IJ SOSY
50.0000 ug | PREFILLED_SYRINGE | Freq: Once | INTRAMUSCULAR | Status: AC
  Administered 2023-12-13: 50 ug via INTRAVENOUS
  Filled 2023-12-13: qty 1

## 2023-12-13 MED ORDER — IOHEXOL 350 MG/ML SOLN
60.0000 mL | Freq: Once | INTRAVENOUS | Status: AC | PRN
  Administered 2023-12-13: 60 mL via INTRAVENOUS

## 2023-12-13 MED ORDER — CEPHALEXIN 500 MG PO CAPS: 500.0000 mg | ORAL_CAPSULE | Freq: Four times a day (QID) | ORAL | 0 refills | Status: AC

## 2023-12-13 MED ORDER — CEPHALEXIN 250 MG PO CAPS
500.0000 mg | ORAL_CAPSULE | Freq: Once | ORAL | Status: AC
  Administered 2023-12-13: 500 mg via ORAL
  Filled 2023-12-13: qty 2

## 2023-12-13 NOTE — ED Provider Notes (Signed)
 [place] EMERGENCY DEPARTMENT AT [facility] Provider Note   CSN: [id number] Arrival date & time: 12/13/23  1924     Patient presents with: Fall  HPI Maria [name] is a 83 y.o. female with history of type 2 diabetes, stroke, CAD, hypertension, paroxysmal SVT presenting for fall.  Occurred about 2 hours ago.  She was walking in her bedroom and fell and tripped over a raised part of the floor.  She denies any preceding lightheadedness dizziness, chest pain, palpitations or shortness of breath.  She states she fell flat on her face and landed also on her right elbow and left knee.  She is reporting pain in those areas but states she still able to move the left leg normally and right elbow.  She also reports pain all over the back.  Denies any radiation of that pain into the chest or abdomen or pelvis.    Fall       Prior to Admission medications   Medication Sig Start Date End Date Taking? Authorizing Provider  cephALEXin  (KEFLEX ) 500 MG capsule Take 1 capsule (500 mg total) by mouth 4 (four) times daily for 6 days. 12/13/23 12/19/23 Yes Oluwadarasimi Redmon K, PA-C  acetaminophen  (TYLENOL ) 325 MG tablet Take 1-2 tablets (325-650 mg total) by mouth every 6 (six) hours as needed. 07/20/23   Cleatus Arlyss RAMAN, MD  Bempedoic Acid -Ezetimibe  (NEXLIZET ) 180-10 MG TABS Take 1 tablet by mouth daily. 06/22/23   [name], [name], MD  Cholecalciferol (VITAMIN D3) 50 MCG (2000 UT) capsule Take 1 capsule (2,000 Units total) by mouth daily. 02/10/22   Cleatus Arlyss RAMAN, MD  cyanocobalamin  (VITAMIN B12) 1000 MCG/ML injection 1000 mcg injected IM every 14 days. 02/02/23   Cleatus Arlyss RAMAN, MD  glyBURIDE  (DIABETA ) 5 MG tablet TAKE 1 TABLET BY MOUTH 2 TIMES DAILY WITH A MEAL. 11/28/23   Cleatus Arlyss RAMAN, MD  losartan  (COZAAR ) 25 MG tablet Take 1 tablet (25 mg total) by mouth daily. 02/19/23   [name], MD  nitroGLYCERIN  (NITROSTAT ) 0.4 MG SL tablet PLACE 1 TABLET UNDER THE TONGUE EVERY 5 MINUTES AS NEEDED  FOR CHEST PAIN FOR UP TO 3 DOSES 11/10/20   [name], MD  omeprazole  (PRILOSEC) 20 MG capsule Take 1 capsule (20 mg total) by mouth 2 (two) times daily as needed (for acid reflux/indigestion.). 05/22/22   Cleatus Arlyss RAMAN, MD  oxyCODONE  (OXY IR/ROXICODONE ) 5 MG immediate release tablet Take 1 tablet (5 mg total) by mouth every 6 (six) hours as needed (for back pain.  don't take with tramadol .  sedation caution). 12/10/23   Cleatus Arlyss RAMAN, MD  traMADol  (ULTRAM ) 50 MG tablet TAKE 1 TABLET BY MOUTH EVERY 6 HOURS AS NEEDED. 10/30/23   Cleatus Arlyss RAMAN, MD  Vitamin D , Ergocalciferol , (DRISDOL ) 1.25 MG (50000 UNIT) CAPS capsule Take 1 capsule (50,000 Units total) by mouth every 7 (seven) days. 09/16/23   Cleatus Arlyss RAMAN, MD    Allergies: Crestor  [rosuvastatin ], Ezetimibe -simvastatin, Pravastatin , Diazepam , Lyrica [pregabalin], Nsaids, Praluent  [alirocumab ], Repatha  [evolocumab ], Carvedilol, Gabapentin , Ibuprofen , Insulin  glargine, Levemir  [insulin  detemir], Macrobid [nitrofurantoin macrocrystal], Metformin, Metoprolol tartrate, and Naproxen    Review of Systems See HPI   Physical Exam   Vitals:   12/13/23 2030 12/13/23 2215  BP: (!) 161/78 (!) 154/68  Pulse: 87 88  Resp: 17 (!) 22  Temp:    SpO2: 100% 100%    CONSTITUTIONAL: well-appearing, NAD NEURO:  Alert and oriented x 3, CN 3-12 grossly intact EYES:  eyes equal and reactive ENT/NECK:  Supple, no stridor, nose is swollen, dried blood in both nares but no evidence of active bleeding CARDIO:  regular rate and rhythm, appears well-perfused, radial pulses and pedal pulses are 2+ bilaterally PULM:  No respiratory distress, CTAB GI/GU:  non-distended, soft, non tender MSK/SPINE:  No gross deformities, no edema, moves all extremities.  Bruise about the right elbow and general tenderness about the left knee with no evidence of trauma. SKIN:  no rash, atraumatic  *Additional and/or pertinent findings included in MDM below  (all labs  ordered are listed, but only abnormal results are displayed) Labs Reviewed  COMPREHENSIVE METABOLIC PANEL WITH GFR - Abnormal; Notable for the following components:      Result Value   Glucose, Bld 138 (*)    Creatinine, Ser 1.01 (*)    Total Protein 6.3 (*)    GFR, Estimated 55 (*)    All other components within normal limits  URINALYSIS, ROUTINE W REFLEX MICROSCOPIC - Abnormal; Notable for the following components:   APPearance HAZY (*)    Hgb urine dipstick LARGE (*)    Nitrite POSITIVE (*)    Leukocytes,Ua SMALL (*)    Bacteria, UA MANY (*)    All other components within normal limits  I-STAT CHEM 8, ED - Abnormal; Notable for the following components:   Glucose, Bld 148 (*)    All other components within normal limits  CBC  ETHANOL  PROTIME-INR  I-STAT CG4 LACTIC ACID, ED    EKG: None  Radiology: CT L-SPINE NO CHARGE Result Date: 12/13/2023 CLINICAL DATA:  Fall EXAM: CT LUMBAR SPINE WITHOUT CONTRAST TECHNIQUE: Multidetector CT imaging of the lumbar spine was performed without intravenous contrast administration. Multiplanar CT image reconstructions were also generated. RADIATION DOSE REDUCTION: This exam was performed according to the departmental dose-optimization program which includes automated exposure control, adjustment of the mA and/or kV according to patient size and/or use of iterative reconstruction technique. COMPARISON:  08/28/2022 FINDINGS: Segmentation: 5 lumbar type vertebrae. Alignment: Mild convex rightward scoliosis centered in the upper lumbar spine. No subluxation. Vertebrae: No acute fracture or focal pathologic process. Paraspinal and other soft tissues: Negative Disc levels: Posterior fusion changes from L2-L5. No hardware complicating feature. Diffuse degenerative disc and facet disease. IMPRESSION: Degenerative disc and facet disease.  No acute bony abnormality. Electronically Signed   By: Franky Crease M.D.   On: 12/13/2023 21:26   CT CERVICAL SPINE WO  CONTRAST Result Date: 12/13/2023 CLINICAL DATA:  Fall. EXAM: CT CERVICAL SPINE WITHOUT CONTRAST TECHNIQUE: Multidetector CT imaging of the cervical spine was performed without intravenous contrast. Multiplanar CT image reconstructions were also generated. RADIATION DOSE REDUCTION: This exam was performed according to the departmental dose-optimization program which includes automated exposure control, adjustment of the mA and/or kV according to patient size and/or use of iterative reconstruction technique. COMPARISON:  None Available. FINDINGS: Alignment: No subluxation. Skull base and vertebrae: No acute fracture. No primary bone lesion or focal pathologic process. Soft tissues and spinal canal: No prevertebral fluid or swelling. No visible canal hematoma. Disc levels: Prior ACDF C4-C6. Solid bony fusion across the disc spaces. Diffuse degenerative facet disease. Upper chest: No acute findings Other: None IMPRESSION: Postoperative and degenerative changes.  No acute bony abnormality. Electronically Signed   By: Franky Crease M.D.   On: 12/13/2023 21:23   CT CHEST ABDOMEN PELVIS W CONTRAST Result Date: 12/13/2023 CLINICAL DATA:  Polytrauma, blunt.  Fall. EXAM: CT CHEST, ABDOMEN, AND PELVIS WITH CONTRAST TECHNIQUE:  Multidetector CT imaging of the chest, abdomen and pelvis was performed following the standard protocol during bolus administration of intravenous contrast. RADIATION DOSE REDUCTION: This exam was performed according to the departmental dose-optimization program which includes automated exposure control, adjustment of the mA and/or kV according to patient size and/or use of iterative reconstruction technique. CONTRAST:  60mL OMNIPAQUE  IOHEXOL  350 MG/ML SOLN COMPARISON:  07/09/2023 FINDINGS: CT CHEST FINDINGS Cardiovascular: Heart is normal size. Aorta is normal caliber. Coronary artery and aortic atherosclerosis. Mediastinum/Nodes: No mediastinal, hilar, or axillary adenopathy. Trachea and esophagus are  unremarkable. Thyroid  unremarkable. Lungs/Pleura: Lungs are clear. No focal airspace opacities or suspicious nodules. No effusions. No pneumothorax. Musculoskeletal: Chest wall soft tissues are unremarkable. No acute bony abnormality. CT ABDOMEN PELVIS FINDINGS Hepatobiliary: No hepatic injury or perihepatic hematoma. Prior cholecystectomy. Mild ductal dilatation likely related to age and post cholecystectomy state. Pancreas: No focal abnormality or ductal dilatation. Spleen: No splenic injury or perisplenic hematoma. Adrenals/Urinary Tract: No adrenal hemorrhage or renal injury identified. Bladder is unremarkable. Large cyst in the lower pole of the right kidney appears simple. No follow-up imaging recommended. No hydronephrosis. Stomach/Bowel: Normal appendix. Stomach, large and small bowel grossly unremarkable. Vascular/Lymphatic: Aortic atherosclerosis. No evidence of aneurysm or adenopathy. Reproductive: Prior hysterectomy.  No adnexal masses. Other: No free fluid or free air. Musculoskeletal: Postoperative and degenerative changes in the lumbar spine. No acute bony abnormality. IMPRESSION: No acute findings or significant traumatic injury in the chest, abdomen or pelvis. Coronary artery disease, aortic atherosclerosis. Electronically Signed   By: Franky Crease M.D.   On: 12/13/2023 21:21   CT MAXILLOFACIAL WO CONTRAST Result Date: 12/13/2023 CLINICAL DATA:  Fall, facial trauma EXAM: CT MAXILLOFACIAL WITHOUT CONTRAST TECHNIQUE: Multidetector CT imaging of the maxillofacial structures was performed. Multiplanar CT image reconstructions were also generated. RADIATION DOSE REDUCTION: This exam was performed according to the departmental dose-optimization program which includes automated exposure control, adjustment of the mA and/or kV according to patient size and/or use of iterative reconstruction technique. COMPARISON:  None Available. FINDINGS: Osseous: Nasal bone fractures noted. There is buckling of the  nasal septum to the left which may reflect an acute fracture. This is new since head CT from 2021. Zygomatic arches and mandible intact. Orbits: Negative. No traumatic or inflammatory finding. Sinuses: Clear Soft tissues: Soft tissue swelling in the nose. Limited intracranial: See head CT report IMPRESSION: Nasal bone fractures and nasal septal fracture. Electronically Signed   By: Franky Crease M.D.   On: 12/13/2023 21:16   CT T-SPINE NO CHARGE Result Date: 12/13/2023 CLINICAL DATA:  Poly trauma, blunt EXAM: CT Thoracic Spine with contrast TECHNIQUE: Multiplanar CT images of the thoracic spine were reconstructed from contemporary CT of the Chest. RADIATION DOSE REDUCTION: This exam was performed according to the departmental dose-optimization program which includes automated exposure control, adjustment of the mA and/or kV according to patient size and/or use of iterative reconstruction technique. CONTRAST:  No additional contrast material was administered. COMPARISON:  Thoracic spine radiographs 01/12/2023 FINDINGS: Alignment: Normal. Vertebrae: No acute fracture or focal pathologic process. Paraspinal and other soft tissues: Negative. Disc levels: Diffuse degenerative changes with narrowed interspaces and endplate osteophyte formation. Degenerative changes in the facet joints. Prominent disc osteophyte complexes at T7-8 and T8-9 levels. Mild bone encroachment upon the anterior thecal sac at these levels. IMPRESSION: 1. Normal alignment.  No acute displaced fractures are identified. 2. Moderate degenerative changes throughout the thoracic spine. Electronically Signed   By: Elsie Gravely M.D.   On: 12/13/2023  21:14   CT HEAD WO CONTRAST Result Date: 12/13/2023 CLINICAL DATA:  Head trauma, moderate-severe.  Fall EXAM: CT HEAD WITHOUT CONTRAST TECHNIQUE: Contiguous axial images were obtained from the base of the skull through the vertex without intravenous contrast. RADIATION DOSE REDUCTION: This exam was  performed according to the departmental dose-optimization program which includes automated exposure control, adjustment of the mA and/or kV according to patient size and/or use of iterative reconstruction technique. COMPARISON:  09/21/2019 FINDINGS: Brain: There is atrophy and chronic small vessel disease changes. Chronic left basal ganglia lacunar infarcts. No acute intracranial abnormality. Specifically, no hemorrhage, hydrocephalus, mass lesion, acute infarction, or significant intracranial injury. Vascular: No hyperdense vessel or unexpected calcification. Skull: No acute calvarial abnormality. Sinuses/Orbits: No acute findings Other: None IMPRESSION: Atrophy, chronic microvascular disease. No acute intracranial abnormality. Old left basal ganglia lacunar infarcts. Electronically Signed   By: Franky Crease M.D.   On: 12/13/2023 21:13   DG Elbow Complete Right Result Date: 12/13/2023 CLINICAL DATA:  Fall, pain EXAM: RIGHT ELBOW - COMPLETE 3+ VIEW COMPARISON:  None Available. FINDINGS: There is no evidence of fracture, dislocation, or joint effusion. There is no evidence of arthropathy or other focal bone abnormality. Soft tissues are unremarkable. IMPRESSION: Negative. Electronically Signed   By: Franky Crease M.D.   On: 12/13/2023 20:25   DG Knee Left Port Result Date: 12/13/2023 CLINICAL DATA:  Fall, pain EXAM: PORTABLE LEFT KNEE - 1-2 VIEW COMPARISON:  None Available. FINDINGS: Joint space narrowing. Chondrocalcinosis noted. No acute bony abnormality. Specifically, no fracture, subluxation, or dislocation. No joint effusion. IMPRESSION: Early degenerative changes.  No acute bony abnormality. Electronically Signed   By: Franky Crease M.D.   On: 12/13/2023 20:25   DG Chest Port 1 View Result Date: 12/13/2023 CLINICAL DATA:  Fall EXAM: PORTABLE CHEST 1 VIEW COMPARISON:  None Available. FINDINGS: Heart and mediastinal contours are within normal limits. No focal opacities or effusions. No acute bony  abnormality. Aortic atherosclerosis. Loop recorder device in place. IMPRESSION: No active cardiopulmonary disease. Electronically Signed   By: Franky Crease M.D.   On: 12/13/2023 20:24     Procedures   Medications Ordered in the ED  cephALEXin  (KEFLEX ) capsule 500 mg (has no administration in time range)  fentaNYL  (SUBLIMAZE ) injection 50 mcg (50 mcg Intravenous Given 12/13/23 2030)  ondansetron  (ZOFRAN ) injection 4 mg (4 mg Intravenous Given 12/13/23 2029)  iohexol  (OMNIPAQUE ) 350 MG/ML injection 60 mL (60 mLs Intravenous Contrast Given 12/13/23 2051)                                    Medical Decision Making Amount and/or Complexity of Data Reviewed Labs: ordered. Radiology: ordered.  Risk Prescription drug management.   Initial Impression and Ddx 83 yo well appearing female presenting for fall.  Exam notable for swelling around the nose and blood in the nares, bruise overlying the right elbow and tenderness to palpation generally about the left knee. Patient PMH that increases complexity of ED encounter:  history of type 2 diabetes, stroke, CAD, hypertension, paroxysmal SVT   Interpretation of Diagnostics - I independent reviewed and interpreted the labs as followed: Hematuria, positive nitrite, pyuria and bacteriuria  - I independently visualized the following imaging with scope of interpretation limited to determining acute life threatening conditions related to emergency care: CT max face, which revealed multiple nasal fractures and nasal septum fracture.  Other imaging studies were negative for  acute findings.  See details above.  Findings shared with patient.  Patient Reassessment and Ultimate Disposition/Management On reassessment, pain well-controlled.  Workup overall reassuring.  Advised her to follow-up with ENT for the multiple nasal fractures.  Appeared that she did have some bleeding from the nose but no active bleeding witnessed during this encounter and she has  remained hemodynamically stable throughout her time here.  Urinalysis was concerning for infection.  Started on Keflex .  Advised for treatment for her superficial wounds sustained during the fall.  Advised to follow-up with her PCP.  Sent Keflex  to her pharmacy.  Discussed return precautions.  Discharged in good condition.  Patient management required discussion with the following services or consulting groups:  None  Complexity of Problems Addressed Acute complicated illness or Injury  Additional Data Reviewed and Analyzed Further history obtained from: Past medical history and medications listed in the EMR and Prior ED visit notes  Patient Encounter Risk Assessment Consideration of hospitalization      Final diagnoses:  Fall, initial encounter  Urinary tract infection with hematuria, site unspecified  Closed fracture of nasal bone, initial encounter    ED Discharge Orders          Ordered    cephALEXin  (KEFLEX ) 500 MG capsule  4 times daily        12/13/23 2255               Lorance Pickeral K, PA-C 12/13/23 2255    Kammerer, Megan L, DO 12/19/23 9198

## 2023-12-13 NOTE — Discharge Instructions (Addendum)
 Evaluation today revealed that you have multiple nasal fractures.  At this time, recommending follow-up with ENT for further evaluation in the outpatient setting.  Labs revealed concern for UTI as well.  Starting on Keflex .  Please follow-up with your PCP.  If your symptoms worsen or change in any way please return to the ED for further evaluation.

## 2023-12-13 NOTE — ED Notes (Signed)
 Patient transported to CT

## 2023-12-13 NOTE — ED Triage Notes (Signed)
 Pt bib ems c/o fall on tonight due to tripping on something. Pt complains of pain to nose along with left knee pain. Denies being on blood thinners. Denies LOC.

## 2023-12-20 ENCOUNTER — Ambulatory Visit: Admitting: Family Medicine

## 2023-12-25 ENCOUNTER — Ambulatory Visit: Admitting: Family Medicine

## 2023-12-25 ENCOUNTER — Encounter: Payer: Self-pay | Admitting: Family Medicine

## 2023-12-25 VITALS — BP 132/70 | HR 73 | Temp 99.0°F | Ht 65.0 in | Wt 150.4 lb

## 2023-12-25 DIAGNOSIS — E538 Deficiency of other specified B group vitamins: Secondary | ICD-10-CM

## 2023-12-25 DIAGNOSIS — E1159 Type 2 diabetes mellitus with other circulatory complications: Secondary | ICD-10-CM

## 2023-12-25 DIAGNOSIS — G894 Chronic pain syndrome: Secondary | ICD-10-CM

## 2023-12-25 DIAGNOSIS — M810 Age-related osteoporosis without current pathological fracture: Secondary | ICD-10-CM | POA: Diagnosis not present

## 2023-12-25 MED ORDER — GABAPENTIN 100 MG PO CAPS: 100.0000 mg | ORAL_CAPSULE | Freq: Three times a day (TID) | ORAL | 1 refills | Status: AC | PRN

## 2023-12-25 MED ORDER — OXYCODONE HCL 5 MG PO TABS: 5.0000 mg | ORAL_TABLET | Freq: Four times a day (QID) | ORAL | 0 refills | Status: DC | PRN

## 2023-12-25 NOTE — Patient Instructions (Addendum)
 Go to the lab on the way out.   If you have mychart we'll likely use that to update you.    Take care.  Glad to see you.  I would retry gabapentin  100mg  a day. Try that for about 1 week.  If tolerated and needed, try 2 per day, then three per day.  If having diarrhea, then cut back or stop.

## 2023-12-25 NOTE — Progress Notes (Signed)
 Back pain. Recent CT with degenerative disc and facet disease.  No acute bony abnormality.   Nasal bone fractures and nasal septal fracture. D/w pt about fall at home.    Leg aches from back, back pain continues.   She doesn't want to have surgery.    Bridge of nose is sore but bruising improved and she is breathing well through her nose.   She didn't pass out, she tripped, cautions d/w pt.  Safety d/w pt.    She had been taking B12 monthly. Most recent dose was about 3 weeks ago.  Recheck pending.  See notes on labs about potential change in dosing frequency.    We talked about retrial of gabapentin  with GI cautions, for back pain.   Meds, vitals, and allergies reviewed.   ROS: Per HPI unless specifically indicated in ROS section   Nad Ncat Neck supple, no LA Rrr Ctab Abd soft, not ttp Skin well perfused.  Sounds to have symmetric air movement through the nostrils without obvious obstruction with unilateral testing.  Minimal bruising on the left side of the nose. Bridge of nose still sore to palpation by patient.  33 minutes were devoted to patient care in this encounter (this includes time spent reviewing the patient's file/history, interviewing and examining the patient, counseling/reviewing plan with patient).

## 2023-12-26 ENCOUNTER — Ambulatory Visit: Payer: Self-pay | Admitting: Family Medicine

## 2023-12-26 LAB — VITAMIN D 25 HYDROXY (VIT D DEFICIENCY, FRACTURES): VITD: 32.22 ng/mL (ref 30.00–100.00)

## 2023-12-26 LAB — VITAMIN B12: Vitamin B-12: 252 pg/mL (ref 211–911)

## 2023-12-26 LAB — HEMOGLOBIN A1C: Hgb A1c MFr Bld: 6.6 % — ABNORMAL HIGH (ref 4.6–6.5)

## 2023-12-26 NOTE — Assessment & Plan Note (Signed)
 Chronic back pain.  She does not want to have surgery.  Discussed using oxycodone  if needed.  Hydrocodone  removed from med list.  Fall cautions discussed with patient.  Discussed trying gabapentin  at a low dose to see if she can tolerate that and to see if it helps her pain.  She can start with 100 mg a day and gradually increase from there as tolerated.  She will update me as needed.

## 2023-12-26 NOTE — Assessment & Plan Note (Signed)
 She had been taking B12 monthly. Most recent dose was about 3 weeks ago.  Recheck pending.  See notes on labs about potential change in dosing frequency.

## 2024-01-07 ENCOUNTER — Ambulatory Visit

## 2024-01-07 DIAGNOSIS — I639 Cerebral infarction, unspecified: Secondary | ICD-10-CM

## 2024-01-08 LAB — CUP PACEART REMOTE DEVICE CHECK
Date Time Interrogation Session: 20250823231951
Implantable Pulse Generator Implant Date: 20220407

## 2024-01-09 ENCOUNTER — Ambulatory Visit: Payer: Self-pay | Admitting: Internal Medicine

## 2024-01-17 ENCOUNTER — Encounter

## 2024-01-18 ENCOUNTER — Telehealth: Payer: Self-pay | Admitting: Family Medicine

## 2024-01-18 MED ORDER — OXYCODONE HCL 5 MG PO TABS: 5.0000 mg | ORAL_TABLET | Freq: Four times a day (QID) | ORAL | 0 refills | Status: DC | PRN

## 2024-01-18 NOTE — Addendum Note (Signed)
 Addended by: CLEATUS ARLYSS RAMAN on: 01/18/2024 04:57 PM   Modules accepted: Orders

## 2024-01-18 NOTE — Telephone Encounter (Signed)
 Copied from CRM [id number]. Topic: Clinical - Medication Question >> Jan 18, 2024  9:37 AM Maria [name] wrote: Reason for CRM: Patient would like to know if Dr. Cleatus would send oxyCODONE  (OXY IR/ROXICODONE ) 5 MG immediate release tablet in due to being in the yard and stated Dr. Cleatus knows she has rods in her back. Please call [phone]

## 2024-01-18 NOTE — Telephone Encounter (Signed)
 Rx sent. Thanks

## 2024-01-22 ENCOUNTER — Encounter (HOSPITAL_COMMUNITY): Payer: Self-pay | Admitting: Neurological Surgery

## 2024-01-30 NOTE — Progress Notes (Signed)
 Remote Loop Recorder Transmission

## 2024-02-06 NOTE — Progress Notes (Signed)
 DEZERAY PUCCIO                                          MRN: [id number]   02/06/2024   The [facility] Quality Team Specialist reviewed this patient medical record for the purposes of chart review for care gap closure. The following were reviewed: chart review for care gap closure-kidney health evaluation for diabetes:eGFR  and uACR.    [facility] Quality Team

## 2024-02-07 ENCOUNTER — Ambulatory Visit

## 2024-02-07 DIAGNOSIS — I639 Cerebral infarction, unspecified: Secondary | ICD-10-CM

## 2024-02-07 LAB — CUP PACEART REMOTE DEVICE CHECK
Date Time Interrogation Session: 20250924230816
Implantable Pulse Generator Implant Date: 20220407

## 2024-02-10 ENCOUNTER — Ambulatory Visit: Payer: Self-pay | Admitting: Internal Medicine

## 2024-02-11 NOTE — Progress Notes (Signed)
 Remote Loop Recorder Transmission

## 2024-02-14 NOTE — Progress Notes (Signed)
 Remote Loop Recorder Transmission

## 2024-02-18 ENCOUNTER — Encounter

## 2024-02-29 ENCOUNTER — Other Ambulatory Visit: Payer: Self-pay | Admitting: Family Medicine

## 2024-02-29 ENCOUNTER — Telehealth: Payer: Self-pay | Admitting: Family Medicine

## 2024-02-29 NOTE — Telephone Encounter (Signed)
 Please check with patient about getting scheduled after 03/26/24 so we can recheck her labs at the visit.  She doesn't need to fast.    There are open slots on the schedule on 03/31/24- see if she can do that date.   Thanks.

## 2024-03-10 ENCOUNTER — Ambulatory Visit (INDEPENDENT_AMBULATORY_CARE_PROVIDER_SITE_OTHER)

## 2024-03-10 DIAGNOSIS — I639 Cerebral infarction, unspecified: Secondary | ICD-10-CM | POA: Diagnosis not present

## 2024-03-10 LAB — CUP PACEART REMOTE DEVICE CHECK
Date Time Interrogation Session: 20251026231127
Implantable Pulse Generator Implant Date: 20220407

## 2024-03-11 NOTE — Telephone Encounter (Signed)
 See below, please check with patient about status/scheduling/if she needs refills.  Thanks.

## 2024-03-12 NOTE — Telephone Encounter (Signed)
 Noted. Spoke with pt asking about vit B12 shots. Per 12/25/23 Labs Result Notes, pt was to start vit B12 shots every 2 wks. Pt states she, hasn't started that yet and will do better. I offered to schedule NV, however, pt says she'll have to call back due to getting ready for supper guests this evening. Then stated she had to go and disconnected call.

## 2024-03-12 NOTE — Telephone Encounter (Signed)
 Noted. Thanks.

## 2024-03-13 ENCOUNTER — Ambulatory Visit: Payer: Self-pay | Admitting: Internal Medicine

## 2024-03-13 NOTE — Progress Notes (Signed)
 Remote Loop Recorder Transmission

## 2024-03-20 ENCOUNTER — Encounter

## 2024-03-26 ENCOUNTER — Other Ambulatory Visit: Payer: Self-pay | Admitting: Family Medicine

## 2024-03-26 DIAGNOSIS — M25512 Pain in left shoulder: Secondary | ICD-10-CM

## 2024-03-26 NOTE — Telephone Encounter (Signed)
 Medication: Tramadol  Directions:   50 mg, Every 6 hours PRN         Summary: TAKE 1 TABLET BY MOUTH EVERY 6 HOURS AS NEEDED.     Last given: 10/30/2023 Number refills: 2 Last o/v: 12/25/2023 Follow up:  Labs:

## 2024-03-27 ENCOUNTER — Ambulatory Visit: Payer: Self-pay

## 2024-03-27 ENCOUNTER — Other Ambulatory Visit: Payer: Self-pay | Admitting: Family Medicine

## 2024-03-27 MED ORDER — OXYCODONE HCL 5 MG PO TABS: 5.0000 mg | ORAL_TABLET | Freq: Four times a day (QID) | ORAL | 0 refills | Status: AC | PRN

## 2024-03-27 MED ORDER — OXYCODONE HCL 5 MG PO TABS: 5.0000 mg | ORAL_TABLET | Freq: Four times a day (QID) | ORAL | 0 refills | Status: DC | PRN

## 2024-03-27 NOTE — Telephone Encounter (Signed)
 Patient notified rx was sent and she was very thankful. Patient currently has appt scheduled for 03/31/24 with Dr. Cleatus.

## 2024-03-27 NOTE — Addendum Note (Signed)
 Addended by: CLEATUS ARLYSS RAMAN on: 03/27/2024 01:53 PM   Modules accepted: Orders

## 2024-03-27 NOTE — Telephone Encounter (Signed)
 Noted, rx sent.  If she has progressive symptoms in the meantime then she needs to get rechecked sooner.  Thanks.

## 2024-03-27 NOTE — Telephone Encounter (Signed)
 FYI Only or Action Required?: Action required by provider: medication refill request.  Patient was last seen in primary care on 12/25/2023 by Cleatus Arlyss RAMAN, MD.  Called Nurse Triage reporting Back Pain.  Symptoms began 3 days ago.  Interventions attempted: OTC medications: Tylenol .  Symptoms are: unchanged.  Triage Disposition: See PCP When Office is Open (Within 3 Days)  Patient/caregiver understands and will follow disposition?: No, wishes to speak with PCP      Copied from CRM [id number]. Topic: Clinical - Red Word Triage >> Mar 27, 2024  9:50 AM Terri MATSU wrote: Red Word that prompted transfer to Nurse Triage: Patient is experiencing extreme pain in her back for the last 3 days.       Reason for Disposition  [1] MODERATE back pain (e.g., interferes with normal activities) AND [2] present > 3 days  Answer Assessment - Initial Assessment Questions Patient reports she is unable to come in for an appointment due to not having transportation and would like a prescription for Oxycodone  sent to her pharmacy. Patient has an appointment already scheduled for 11/17. Please advise.       1. ONSET: When did the pain begin? (e.g., minutes, hours, days)     3 days ago 2. LOCATION: Where does it hurt? (upper, mid or lower back)     Upper to mid back  3. SEVERITY: How bad is the pain?  (e.g., Scale 1-10; mild, moderate, or severe)     Moderate to severe  4. PATTERN: Is the pain constant? (e.g., yes, no; constant, intermittent)      Constant  5. RADIATION: Does the pain shoot into your legs or somewhere else?     No radiation  6. CAUSE:  What do you think is causing the back pain?      Has rods in her back from previous surgery  7. BACK OVERUSE:  Any recent lifting of heavy objects, strenuous work or exercise?     No 8. MEDICINES: What have you taken so far for the pain? (e.g., nothing, acetaminophen , NSAIDS)     Tylenol   9. NEUROLOGIC SYMPTOMS: Do you have  any weakness, numbness, or problems with bowel/bladder control?     No 10. OTHER SYMPTOMS: Do you have any other symptoms? (e.g., fever, abdomen pain, burning with urination, blood in urine)      No  Protocols used: Back Pain-A-AH

## 2024-03-31 ENCOUNTER — Ambulatory Visit: Admitting: Family Medicine

## 2024-03-31 ENCOUNTER — Encounter: Payer: Self-pay | Admitting: Family Medicine

## 2024-03-31 VITALS — BP 134/70 | HR 78 | Temp 98.1°F | Ht 65.0 in | Wt 154.0 lb

## 2024-03-31 DIAGNOSIS — E1159 Type 2 diabetes mellitus with other circulatory complications: Secondary | ICD-10-CM | POA: Diagnosis not present

## 2024-03-31 DIAGNOSIS — E538 Deficiency of other specified B group vitamins: Secondary | ICD-10-CM

## 2024-03-31 DIAGNOSIS — Z23 Encounter for immunization: Secondary | ICD-10-CM

## 2024-03-31 DIAGNOSIS — H6990 Unspecified Eustachian tube disorder, unspecified ear: Secondary | ICD-10-CM

## 2024-03-31 DIAGNOSIS — G894 Chronic pain syndrome: Secondary | ICD-10-CM | POA: Diagnosis not present

## 2024-03-31 LAB — MICROALBUMIN / CREATININE URINE RATIO
Creatinine,U: 103.1 mg/dL
Microalb Creat Ratio: 11.5 mg/g (ref 0.0–30.0)
Microalb, Ur: 1.2 mg/dL (ref 0.0–1.9)

## 2024-03-31 LAB — POCT GLYCOSYLATED HEMOGLOBIN (HGB A1C): Hemoglobin A1C: 6.5 % — AB (ref 4.0–5.6)

## 2024-03-31 LAB — VITAMIN B12: Vitamin B-12: 260 pg/mL (ref 211–911)

## 2024-03-31 MED ORDER — SYRINGE/NEEDLE (DISP) 25G X 1" 3 ML MISC: 3 refills | Status: AC

## 2024-03-31 MED ORDER — CYANOCOBALAMIN 1000 MCG/ML IJ SOLN: INTRAMUSCULAR | 3 refills | Status: AC

## 2024-03-31 NOTE — Progress Notes (Unsigned)
 Diabetes:  Using medications without difficulties: yes Hypoglycemic episodes: cautions d/w pt.  Occ sx corrected with a snack.  Hyperglycemic episodes:no Feet problems: no Blood Sugars averaging: 100s usually, lower in the AMs.  eye exam within last year: due, d/w pt.   A1c 6.5.  D/w pt at OV.   B12 low normal.  Recheck pending.   Had been off IM B12 recently.  Discussed restarting B12 to try to prevent symptoms related to deficiency.  Prev Vit D wnl.    Back pain, chronic, ongoing, daily and continual when standing.  Some days the pain is worse than others.  She wants to avoid surgery if at all possible.  Sedation cautions d/w pt.  She is trying to get by with tylenol  vs tramadol  vs oxycodone .  Offered pain clinic eval, referred.  She wants to be able to get out in the yard and be more active.  She wants to be able to work on her flowers.  Walking with cane.  Prev xrays reviewed re: L spine fusion.  Lower back pain is clearly worse on standing. Less pain sitting- can be pain free sitting.    Abnormal hearing on the R ear, with cracking and popping sound.   Meds, vitals, and allergies reviewed.   ROS: Per HPI unless specifically indicated in ROS section   GEN: nad, alert and oriented HEENT: mucous membranes moist, R ETD on exam-no tympanic membrane movement with Valsalva.  B TM wnl inspection.  NECK: supple w/o LA CV: rrr. PULM: ctab, no inc wob ABD: soft, +bs EXT: no edema SKIN: Well-perfused.  Walking with a limp at baseline due to back pain.  Memory checked at office visit.  She is oriented to year month and day of the week.  She did not recall the date.  Can do basic math, has 3 out of 3 recall.  Normal attention and concentration, able to spell world forward and backward.  35 minutes were devoted to patient care in this encounter (this includes time spent reviewing the patient's file/history, interviewing and examining the patient, counseling/reviewing plan with patient).

## 2024-03-31 NOTE — Patient Instructions (Signed)
 Go to the lab on the way out.   If you have mychart we'll likely use that to update you.    Take care.  Glad to see you.

## 2024-04-02 NOTE — Assessment & Plan Note (Signed)
 B12 low normal.  Recheck pending.   Had been off IM B12 recently.  Discussed restarting B12 to try to prevent symptoms related to deficiency.

## 2024-04-02 NOTE — Assessment & Plan Note (Signed)
 Discussed gently performing Valsalva.  She can update me as needed.

## 2024-04-02 NOTE — Assessment & Plan Note (Addendum)
 She wants to avoid surgery if possible.   Offered pain clinic eval, referred.  She wants to be able to get out in the yard and be more active.  She wants to be able to work on her flowers.  Walking with cane.  Prev xrays reviewed re: L spine fusion.  Lower back pain is clearly worse on standing. Less pain sitting- can be pain free sitting.   Would continue with Tylenol -----> tramadol -----> oxycodone  depending on the severity of her pain.  Advised not to use tramadol  and oxycodone  together.  Sedation caution discussed with patient.

## 2024-04-02 NOTE — Assessment & Plan Note (Signed)
 A1c 6.5.  D/w pt at OV.  See notes on microalbumin. Continue glyburide  as is for now.

## 2024-04-10 ENCOUNTER — Encounter

## 2024-04-10 ENCOUNTER — Ambulatory Visit: Attending: Internal Medicine

## 2024-04-10 DIAGNOSIS — I639 Cerebral infarction, unspecified: Secondary | ICD-10-CM | POA: Diagnosis not present

## 2024-04-12 LAB — CUP PACEART REMOTE DEVICE CHECK
Date Time Interrogation Session: 20251126231028
Implantable Pulse Generator Implant Date: 20220407

## 2024-04-16 NOTE — Progress Notes (Signed)
 Remote Loop Recorder Transmission

## 2024-04-17 ENCOUNTER — Ambulatory Visit: Payer: Self-pay | Admitting: Internal Medicine

## 2024-04-18 ENCOUNTER — Encounter (HOSPITAL_COMMUNITY): Payer: Self-pay

## 2024-04-18 ENCOUNTER — Emergency Department (HOSPITAL_COMMUNITY): Admission: EM | Admit: 2024-04-18 | Discharge: 2024-04-18 | Disposition: A | Discharge status: Home / Self Care

## 2024-04-18 ENCOUNTER — Emergency Department (HOSPITAL_COMMUNITY)

## 2024-04-18 ENCOUNTER — Ambulatory Visit: Payer: Self-pay

## 2024-04-18 DIAGNOSIS — M19072 Primary osteoarthritis, left ankle and foot: Secondary | ICD-10-CM | POA: Diagnosis not present

## 2024-04-18 DIAGNOSIS — Z7984 Long term (current) use of oral hypoglycemic drugs: Secondary | ICD-10-CM | POA: Insufficient documentation

## 2024-04-18 DIAGNOSIS — S9032XA Contusion of left foot, initial encounter: Secondary | ICD-10-CM | POA: Diagnosis not present

## 2024-04-18 DIAGNOSIS — S90129A Contusion of unspecified lesser toe(s) without damage to nail, initial encounter: Secondary | ICD-10-CM

## 2024-04-18 DIAGNOSIS — R233 Spontaneous ecchymoses: Secondary | ICD-10-CM | POA: Diagnosis not present

## 2024-04-18 DIAGNOSIS — E119 Type 2 diabetes mellitus without complications: Secondary | ICD-10-CM | POA: Diagnosis not present

## 2024-04-18 DIAGNOSIS — S90122A Contusion of left lesser toe(s) without damage to nail, initial encounter: Secondary | ICD-10-CM | POA: Diagnosis not present

## 2024-04-18 NOTE — Telephone Encounter (Signed)
 FYI Only or Action Required?: FYI only for provider: ED advised.  Patient was last seen in primary care on 03/31/2024 by Maria Arlyss RAMAN, MD.  Called Nurse Triage reporting Cyanosis.  Symptoms began yesterday.  Interventions attempted: Rest, hydration, or home remedies.  Symptoms are: rapidly worsening.  Triage Disposition: Go to ED Now (or PCP Triage)  Patient/caregiver understands and will follow disposition?: Yes   Copied from CRM [id number]. Topic: Clinical - Red Word Triage >> Apr 18, 2024  8:43 AM Carlyon D wrote: Red Word that prompted transfer to Nurse Triage: diabetic middle toe swollen and  blue is now worse  now the toe next to her middle is swollen and blue and pt is an extreme diabetic Reason for Disposition  [1] Blue or purple color of finger(s) or toe(s) AND [2] painful AND [3] not caused by cold exposure  Answer Assessment - Initial Assessment Questions ED advised, patient in agreement. Discussed reasons to call 911   1.  MAIN CONCERN OR SYMPTOM: What is your main concern right now? Describe how the skin looks? (e.g., ashen, blue, gray, purple; lacy or mottled pattern)     Blue toe middle noted last night, this morning middle toe is almost black entire toe, now toe next to it is blue half way up the toe.  2. LOCATION: What part or parts of the body? (e.g., chest, face, lips, inside mouth, hands, feet, just one side of body or both). If only hand(s) or foot(feet), ask: Was there any recent exposure to cold?       Middle toe 3. ONSET: When did this start? (e.g., minutes, hours, days; chronic problem)     Last night  4. PATTERN: Does this come and go or is it constant? (e.g., intermittent or constant; getting better or worse; only happens when fingers get cold)     constant 5. OTHER SYMPTOMS: Do you have any other symptoms? (e.g., breathing problems, chest pain, confusion, cough, fever, feeling dizzy, pain, weakness)     Denies chest pain, breathing  difficulty, and all other symptoms.  6. PAST MEDICAL HISTORY: Have you had this before? Is this a long-term (ongoing or recurring) problem? (e.g.,  COPD, heart failure, Raynaud's Phenomenon)       7. PREGNANCY: Is there any chance you are pregnant? When was your last menstrual period?  Protocols used: Bluish Skin or Body Part (Cyanosis)-A-AH

## 2024-04-18 NOTE — Telephone Encounter (Signed)
Agree with ER eval.  Thanks.  

## 2024-04-18 NOTE — ED Triage Notes (Signed)
 Pt reports yesterday she noticed her middle toe on her left foot was discolored and this morning looks like it has spread to her second toe. Hx diabetes, reports decreased sensation at baseline, denies pain.

## 2024-04-18 NOTE — ED Provider Notes (Signed)
 [facility] AT [facility] Provider Note   CSN: [id number] Arrival date & time: 04/18/24  9064     Patient presents with: No chief complaint on file.   Maria [name] is a 83 y.o. female.   HPI   Presents because of bruising to the left foot third toe as well as second toe.  Started noticing yesterday.  Patient states that she has neuropathy of bilateral lower extremities in the setting of type 2 diabetes.  Patient states that noticed discoloration yesterday.  She is not sure if she hit it or not.  No fever no chills.  No chest pain or shortness of breath.  No nausea vomit diarrhea.  Otherwise, feeling at baseline.  Does not follow-up with podiatry.  Previous medical history reviewed : Patient last followed up with PCP in November 2025.  A1C 6.5.   Prior to Admission medications   Medication Sig Start Date End Date Taking? Authorizing Provider  acetaminophen  (TYLENOL ) 325 MG tablet Take 1-2 tablets (325-650 mg total) by mouth every 6 (six) hours as needed. 07/20/23   Cleatus Arlyss RAMAN, MD  Bempedoic Acid -Ezetimibe  (NEXLIZET ) 180-10 MG TABS Take 1 tablet by mouth daily. 06/22/23   [name], [name], MD  Cholecalciferol (VITAMIN D3) 50 MCG (2000 UT) capsule Take 1 capsule (2,000 Units total) by mouth daily. 02/10/22   Cleatus Arlyss RAMAN, MD  cyanocobalamin  (VITAMIN B12) 1000 MCG/ML injection 1000 mcg injected IM every 14 days. 03/31/24   Cleatus Arlyss RAMAN, MD  gabapentin  (NEURONTIN ) 100 MG capsule Take 1 capsule (100 mg total) by mouth 3 (three) times daily as needed. 12/25/23   Cleatus Arlyss RAMAN, MD  glyBURIDE  (DIABETA ) 5 MG tablet TAKE 1 TABLET BY MOUTH 2 TIMES DAILY WITH A MEAL. 11/28/23   Cleatus Arlyss RAMAN, MD  losartan  (COZAAR ) 25 MG tablet Take 1 tablet (25 mg total) by mouth daily. 02/19/23   [name], [name], MD  nitroGLYCERIN  (NITROSTAT ) 0.4 MG SL tablet PLACE 1 TABLET UNDER THE TONGUE EVERY 5 MINUTES AS NEEDED FOR CHEST PAIN FOR UP TO 3 DOSES 11/10/20   [name], MD  omeprazole  (PRILOSEC) 20 MG capsule Take 1 capsule (20 mg total) by mouth 2 (two) times daily as needed (for acid reflux/indigestion.). 05/22/22   Cleatus Arlyss RAMAN, MD  oxyCODONE  (OXY IR/ROXICODONE ) 5 MG immediate release tablet Take 1 tablet (5 mg total) by mouth every 6 (six) hours as needed (for back pain.  don't take with tramadol .  sedation caution). 03/27/24   Cleatus Arlyss RAMAN, MD  SYRINGE-NEEDLE, DISP, 3 ML 25G X 1 3 ML MISC Use with B12 injections 03/31/24   Cleatus Arlyss RAMAN, MD  traMADol  (ULTRAM ) 50 MG tablet TAKE 1 TABLET BY MOUTH EVERY 6 HOURS AS NEEDED. 03/26/24   Cleatus Arlyss RAMAN, MD    Allergies: Crestor  [rosuvastatin ], Ezetimibe -simvastatin, Pravastatin , Diazepam , Lyrica [pregabalin], Nsaids, Praluent  [alirocumab ], Repatha  [evolocumab ], Carvedilol, Gabapentin , Ibuprofen , Insulin  glargine, Levemir  [insulin  detemir], Macrobid [nitrofurantoin macrocrystal], Metformin, Metoprolol tartrate, and Naproxen    Review of Systems  Constitutional:  Negative for chills and fever.  HENT:  Negative for ear pain and sore throat.   Eyes:  Negative for pain and visual disturbance.  Respiratory:  Negative for cough and shortness of breath.   Cardiovascular:  Negative for chest pain and palpitations.  Gastrointestinal:  Negative for abdominal pain and vomiting.  Genitourinary:  Negative for dysuria and hematuria.  Musculoskeletal:  Negative for arthralgias and back pain.  Skin:  Negative for  color change and rash.  Neurological:  Negative for seizures and syncope.  All other systems reviewed and are negative.   Updated Vital Signs BP (!) 162/96   Pulse 84   Temp (!) 97.3 F (36.3 C) (Oral)   Resp 17   SpO2 100%   Physical Exam Vitals and nursing note reviewed.  Constitutional:      General: She is not in acute distress.    Appearance: She is well-developed.  HENT:     Head: Normocephalic and atraumatic.  Eyes:     Conjunctiva/sclera: Conjunctivae normal.  Cardiovascular:      Rate and Rhythm: Normal rate and regular rhythm.     Heart sounds: No murmur heard. Pulmonary:     Effort: Pulmonary effort is normal. No respiratory distress.     Breath sounds: Normal breath sounds.  Abdominal:     Palpations: Abdomen is soft.     Tenderness: There is no abdominal tenderness.  Musculoskeletal:        General: No swelling.     Cervical back: Neck supple.       Feet:  Skin:    General: Skin is warm and dry.     Capillary Refill: Capillary refill takes less than 2 seconds.  Neurological:     Mental Status: She is alert.  Psychiatric:        Mood and Affect: Mood normal.     (all labs ordered are listed, but only abnormal results are displayed) Labs Reviewed - No data to display  EKG: None  Radiology: DG Foot Complete Left Result Date: 04/18/2024 EXAM: 3 OR MORE VIEW(S) XRAY OF THE LEFT FOOT 04/18/2024 10:26:00 AM COMPARISON: Left ankle series 09/28/2021. CLINICAL HISTORY: 83 year old female with bruising to the distal aspect third toe. Diabetes. No open wound. Evaluation for fracture. FINDINGS: BONES AND JOINTS: No acute fracture. No malalignment. Mild midfoot degenerative changes. SOFT TISSUES: The soft tissues are unremarkable. IMPRESSION: 1. No acute osseous abnormality identified in the left foot. Electronically signed by: Helayne Hurst MD 04/18/2024 10:57 AM EST RP Workstation: [id number]     Procedures   Medications Ordered in the ED - No data to display                                  Medical Decision Making Amount and/or Complexity of Data Reviewed Radiology: ordered.     HPI:   Presents because of bruising to the left foot third toe as well as second toe.  Started noticing yesterday.  Patient states that she has neuropathy of bilateral lower extremities in the setting of type 2 diabetes.  Patient states that noticed discoloration yesterday.  She is not sure if she hit it or not.  No fever no chills.  No chest pain or shortness of breath.  No  nausea vomit diarrhea.  Otherwise, feeling at baseline.  Does not follow-up with podiatry.  Previous medical history reviewed : Patient last followed up with PCP in November 2025.  A1C 6.5.  MDM:   Upon exam, patient hemodynamic stable.  Afebrile.  Physical exam showed some bruising to the third toe.  Maybe some slight bruising to the second toe on the left foot.  No concerns of any kind of ischemic disease process.  2+ dorsal pedal pulses and posterior tibial pulses with appropriate capillary refill.  No concerns for DVT.  No swelling to the lower extremities.  No concerns for osteo.  No large open wound.  This is probably more traumatic related probably probably hit it did not realize it.  Will obtain x-ray to make sure there is no obvious foreign body or obvious fracture and reassess.  No indication for labs at this time.  Reevaluation:   Xray unremarkable.  No fracture.  No foreign bodies.  Would not start patient on antibiotics at this time given low concerns for any kind of wound infection this point time.  Very little concern for cellulitis.  Certainly no concern for osteo.  She will keep a close eye on it and follow-up with PCP.    Social Determinant of Health: TIIDM         Final diagnoses:  Superficial bruising of toe    ED Discharge Orders     None          Simon Lavonia SAILOR, MD 04/18/24 1236

## 2024-04-18 NOTE — Discharge Instructions (Addendum)
 Please keep the eye in your toe.  There is no indication to start you on medications at this time.  There is no obvious indication of infection.  I do think this is just bruising due to trauma.  You probably hit your toe or stubbed it on something and any did not even realize given your history of neuropathy.   Keep a close eye on it.  If it gets worse or if you start have any kind of fever chills or increasing redness to the area this might be concerning for infection.  Right now, no concerns for infection.  The radiologist has not looked at your x ray yet. Per my read it looks normal. I will give you a call if it is read as something different.   Keep the area clean

## 2024-04-21 ENCOUNTER — Encounter

## 2024-04-22 ENCOUNTER — Ambulatory Visit

## 2024-05-11 ENCOUNTER — Ambulatory Visit

## 2024-05-11 DIAGNOSIS — I639 Cerebral infarction, unspecified: Secondary | ICD-10-CM

## 2024-05-13 ENCOUNTER — Ambulatory Visit: Payer: Self-pay | Admitting: Cardiology

## 2024-05-13 ENCOUNTER — Ambulatory Visit

## 2024-05-13 LAB — CUP PACEART REMOTE DEVICE CHECK
Date Time Interrogation Session: 20251227230918
Implantable Pulse Generator Implant Date: 20220407

## 2024-05-14 NOTE — Progress Notes (Signed)
 Remote Loop Recorder Transmission

## 2024-05-22 ENCOUNTER — Encounter

## 2024-06-05 ENCOUNTER — Other Ambulatory Visit: Payer: Self-pay | Admitting: Cardiovascular Disease

## 2024-06-11 ENCOUNTER — Ambulatory Visit: Attending: Cardiology

## 2024-06-11 DIAGNOSIS — I639 Cerebral infarction, unspecified: Secondary | ICD-10-CM

## 2024-06-11 LAB — CUP PACEART REMOTE DEVICE CHECK
Date Time Interrogation Session: 20260127230648
Implantable Pulse Generator Implant Date: 20220407

## 2024-06-12 ENCOUNTER — Ambulatory Visit: Payer: Self-pay | Admitting: Cardiology

## 2024-06-19 NOTE — Progress Notes (Signed)
 Remote Loop Recorder Transmission

## 2024-07-12 ENCOUNTER — Ambulatory Visit

## 2024-08-11 ENCOUNTER — Ambulatory Visit

## 2024-08-12 ENCOUNTER — Ambulatory Visit
